# Patient Record
Sex: Male | Born: 1955
Health system: Southern US, Community
[De-identification: ages and names within clinical notes are randomized; demographics above are authoritative.]

## PROBLEM LIST (undated history)

## (undated) ENCOUNTER — Emergency Department (HOSPITAL_COMMUNITY): Admission: EM | Disposition: A | Discharge status: Other Acute Inpatient Hospital | Payer: Self-pay

## (undated) DIAGNOSIS — I272 Pulmonary hypertension, unspecified: Secondary | ICD-10-CM

## (undated) DIAGNOSIS — I10 Essential (primary) hypertension: Secondary | ICD-10-CM

## (undated) DIAGNOSIS — Z8679 Personal history of other diseases of the circulatory system: Secondary | ICD-10-CM

## (undated) DIAGNOSIS — I4891 Unspecified atrial fibrillation: Secondary | ICD-10-CM

## (undated) DIAGNOSIS — Z860100 Personal history of colon polyps, unspecified: Secondary | ICD-10-CM

## (undated) DIAGNOSIS — Z8701 Personal history of pneumonia (recurrent): Secondary | ICD-10-CM

## (undated) DIAGNOSIS — I219 Acute myocardial infarction, unspecified: Secondary | ICD-10-CM

## (undated) DIAGNOSIS — Z87442 Personal history of urinary calculi: Secondary | ICD-10-CM

## (undated) DIAGNOSIS — Z801 Family history of malignant neoplasm of trachea, bronchus and lung: Secondary | ICD-10-CM

## (undated) DIAGNOSIS — E669 Obesity, unspecified: Secondary | ICD-10-CM

## (undated) DIAGNOSIS — J189 Pneumonia, unspecified organism: Secondary | ICD-10-CM

## (undated) DIAGNOSIS — I4811 Longstanding persistent atrial fibrillation: Secondary | ICD-10-CM

## (undated) DIAGNOSIS — I052 Rheumatic mitral stenosis with insufficiency: Secondary | ICD-10-CM

## (undated) DIAGNOSIS — I2510 Atherosclerotic heart disease of native coronary artery without angina pectoris: Secondary | ICD-10-CM

## (undated) DIAGNOSIS — N289 Disorder of kidney and ureter, unspecified: Secondary | ICD-10-CM

## (undated) DIAGNOSIS — I5042 Chronic combined systolic (congestive) and diastolic (congestive) heart failure: Secondary | ICD-10-CM

## (undated) DIAGNOSIS — Z803 Family history of malignant neoplasm of breast: Secondary | ICD-10-CM

## (undated) DIAGNOSIS — E785 Hyperlipidemia, unspecified: Secondary | ICD-10-CM

## (undated) DIAGNOSIS — C801 Malignant (primary) neoplasm, unspecified: Secondary | ICD-10-CM

## (undated) DIAGNOSIS — Z9889 Other specified postprocedural states: Secondary | ICD-10-CM

## (undated) DIAGNOSIS — U071 COVID-19: Secondary | ICD-10-CM

## (undated) DIAGNOSIS — Z972 Presence of dental prosthetic device (complete) (partial): Secondary | ICD-10-CM

## (undated) DIAGNOSIS — J449 Chronic obstructive pulmonary disease, unspecified: Secondary | ICD-10-CM

## (undated) DIAGNOSIS — Z954 Presence of other heart-valve replacement: Secondary | ICD-10-CM

## (undated) HISTORY — DX: Obesity, unspecified: E66.9

## (undated) HISTORY — DX: Family history of malignant neoplasm of trachea, bronchus and lung: Z80.1

## (undated) HISTORY — DX: Chronic obstructive pulmonary disease, unspecified: J44.9

## (undated) HISTORY — DX: Family history of malignant neoplasm of breast: Z80.3

## (undated) HISTORY — PX: APPENDECTOMY: SHX54

## (undated) HISTORY — DX: Personal history of colonic polyps: Z86.010

## (undated) HISTORY — DX: Unspecified atrial fibrillation: I48.91

## (undated) HISTORY — DX: Personal history of colon polyps, unspecified: Z86.0100

## (undated) HISTORY — PX: CORONARY STENT PLACEMENT: SHX1402

---
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
## 2012-06-12 ENCOUNTER — Encounter (HOSPITAL_COMMUNITY): Payer: Self-pay

## 2012-06-12 ENCOUNTER — Inpatient Hospital Stay (HOSPITAL_COMMUNITY)
Admission: EM | Admit: 2012-06-12 | Discharge: 2012-06-18 | DRG: 247 | Disposition: A | Discharge status: Home / Self Care | Payer: MEDICAID | Source: Other Acute Inpatient Hospital | Attending: Cardiology | Admitting: Cardiology

## 2012-06-12 ENCOUNTER — Encounter (HOSPITAL_COMMUNITY)
Admission: EM | Disposition: A | Discharge status: Home / Self Care | Payer: Self-pay | Source: Other Acute Inpatient Hospital | Attending: Cardiology

## 2012-06-12 DIAGNOSIS — Z6841 Body Mass Index (BMI) 40.0 and over, adult: Secondary | ICD-10-CM

## 2012-06-12 DIAGNOSIS — Z87891 Personal history of nicotine dependence: Secondary | ICD-10-CM

## 2012-06-12 DIAGNOSIS — Z955 Presence of coronary angioplasty implant and graft: Secondary | ICD-10-CM

## 2012-06-12 DIAGNOSIS — Z8249 Family history of ischemic heart disease and other diseases of the circulatory system: Secondary | ICD-10-CM

## 2012-06-12 DIAGNOSIS — I10 Essential (primary) hypertension: Secondary | ICD-10-CM | POA: Diagnosis present

## 2012-06-12 DIAGNOSIS — I2119 ST elevation (STEMI) myocardial infarction involving other coronary artery of inferior wall: Secondary | ICD-10-CM

## 2012-06-12 DIAGNOSIS — I4891 Unspecified atrial fibrillation: Secondary | ICD-10-CM | POA: Diagnosis present

## 2012-06-12 DIAGNOSIS — I2510 Atherosclerotic heart disease of native coronary artery without angina pectoris: Secondary | ICD-10-CM | POA: Diagnosis present

## 2012-06-12 DIAGNOSIS — Z79899 Other long term (current) drug therapy: Secondary | ICD-10-CM

## 2012-06-12 DIAGNOSIS — E7800 Pure hypercholesterolemia, unspecified: Secondary | ICD-10-CM | POA: Diagnosis present

## 2012-06-12 HISTORY — DX: Atherosclerotic heart disease of native coronary artery without angina pectoris: I25.10

## 2012-06-12 HISTORY — PX: LEFT HEART CATHETERIZATION WITH CORONARY ANGIOGRAM: SHX5451

## 2012-06-12 HISTORY — DX: Acute myocardial infarction, unspecified: I21.9

## 2012-06-12 LAB — POCT I-STAT, CHEM 8
HCT: 42 % (ref 39.0–52.0)
Hemoglobin: 14.3 g/dL (ref 13.0–17.0)
Potassium: 4.1 mEq/L (ref 3.5–5.1)
Sodium: 140 mEq/L (ref 135–145)
TCO2: 23 mmol/L (ref 0–100)

## 2012-06-12 LAB — DIFFERENTIAL
Basophils Absolute: 0 10*3/uL (ref 0.0–0.1)
Basophils Relative: 0 % (ref 0–1)
Eosinophils Relative: 0 % (ref 0–5)
Lymphocytes Relative: 5 % — ABNORMAL LOW (ref 12–46)
Monocytes Absolute: 0.6 10*3/uL (ref 0.1–1.0)
Monocytes Relative: 4 % (ref 3–12)

## 2012-06-12 LAB — CBC
HCT: 42.6 % (ref 39.0–52.0)
MCHC: 32.9 g/dL (ref 30.0–36.0)
MCV: 85.9 fL (ref 78.0–100.0)
RDW: 14.2 % (ref 11.5–15.5)

## 2012-06-12 LAB — POCT ACTIVATED CLOTTING TIME: Activated Clotting Time: 314 seconds

## 2012-06-12 SURGERY — LEFT HEART CATHETERIZATION WITH CORONARY ANGIOGRAM
Anesthesia: LOCAL

## 2012-06-12 MED ORDER — NITROGLYCERIN IN D5W 200-5 MCG/ML-% IV SOLN
5.0000 ug/min | INTRAVENOUS | Status: DC
  Administered 2012-06-12: 5 ug/min via INTRAVENOUS
  Filled 2012-06-12: qty 250

## 2012-06-12 MED ORDER — AMIODARONE HCL IN DEXTROSE 360-4.14 MG/200ML-% IV SOLN
0.5000 mg/min | INTRAVENOUS | Status: DC
  Administered 2012-06-13 – 2012-06-14 (×4): 0.5 mg/min via INTRAVENOUS
  Filled 2012-06-12 (×8): qty 200

## 2012-06-12 MED ORDER — TICAGRELOR 90 MG PO TABS
90.0000 mg | ORAL_TABLET | Freq: Two times a day (BID) | ORAL | Status: DC
  Administered 2012-06-12 – 2012-06-18 (×11): 90 mg via ORAL
  Filled 2012-06-12 (×14): qty 1

## 2012-06-12 MED ORDER — ASPIRIN 81 MG PO CHEW: 81.0000 mg | CHEWABLE_TABLET | Freq: Every day | ORAL | Status: DC

## 2012-06-12 MED ORDER — PANTOPRAZOLE SODIUM 40 MG PO TBEC: 40.0000 mg | DELAYED_RELEASE_TABLET | Freq: Every day | ORAL | Status: DC

## 2012-06-12 MED ORDER — ACETAMINOPHEN 325 MG PO TABS
650.0000 mg | ORAL_TABLET | ORAL | Status: DC | PRN
  Administered 2012-06-12 – 2012-06-16 (×4): 650 mg via ORAL
  Filled 2012-06-12 (×3): qty 2

## 2012-06-12 MED ORDER — BIVALIRUDIN 250 MG IV SOLR
1.7500 mg/kg/h | INTRAVENOUS | Status: DC
  Filled 2012-06-12: qty 250

## 2012-06-12 MED ORDER — ASPIRIN 81 MG PO CHEW: 324.0000 mg | CHEWABLE_TABLET | ORAL | Status: DC

## 2012-06-12 MED ORDER — ASPIRIN EC 81 MG PO TBEC
81.0000 mg | DELAYED_RELEASE_TABLET | Freq: Every day | ORAL | Status: DC
  Administered 2012-06-13 – 2012-06-18 (×5): 81 mg via ORAL
  Filled 2012-06-12 (×7): qty 1

## 2012-06-12 MED ORDER — AMIODARONE HCL IN DEXTROSE 360-4.14 MG/200ML-% IV SOLN
60.0000 mg/h | INTRAVENOUS | Status: DC
  Filled 2012-06-12 (×4): qty 200

## 2012-06-12 MED ORDER — SODIUM CHLORIDE 0.9 % IV SOLN
INTRAVENOUS | Status: DC
  Administered 2012-06-12 – 2012-06-13 (×3): 1000 mL via INTRAVENOUS

## 2012-06-12 MED ORDER — AMIODARONE HCL IN DEXTROSE 360-4.14 MG/200ML-% IV SOLN
1.0000 mg/min | INTRAVENOUS | Status: AC
  Administered 2012-06-12 (×2): 1 mg/min via INTRAVENOUS
  Filled 2012-06-12: qty 200

## 2012-06-12 MED ORDER — ATORVASTATIN CALCIUM 80 MG PO TABS
80.0000 mg | ORAL_TABLET | Freq: Every day | ORAL | Status: DC
  Administered 2012-06-12 – 2012-06-17 (×6): 80 mg via ORAL
  Filled 2012-06-12 (×8): qty 1

## 2012-06-12 MED ORDER — AMIODARONE IV BOLUS ONLY 150 MG/100ML
150.0000 mg | Freq: Once | INTRAVENOUS | Status: AC
  Administered 2012-06-12: 150 mg via INTRAVENOUS

## 2012-06-12 MED ORDER — ABCIXIMAB 2 MG/ML IV SOLN
0.2500 mg/kg | Freq: Once | INTRAVENOUS | Status: DC
  Filled 2012-06-12 (×2): qty 20

## 2012-06-12 MED ORDER — ACETAMINOPHEN 325 MG PO TABS
650.0000 mg | ORAL_TABLET | ORAL | Status: DC | PRN
  Filled 2012-06-12: qty 2

## 2012-06-12 MED ORDER — SODIUM CHLORIDE 0.9 % IV SOLN
10.0000 ug/min | INTRAVENOUS | Status: DC
  Filled 2012-06-12: qty 3.6

## 2012-06-12 MED ORDER — ONDANSETRON HCL 4 MG/2ML IJ SOLN
4.0000 mg | Freq: Four times a day (QID) | INTRAMUSCULAR | Status: DC | PRN
  Administered 2012-06-12 – 2012-06-17 (×4): 4 mg via INTRAVENOUS
  Filled 2012-06-12 (×4): qty 2

## 2012-06-12 MED ORDER — METOPROLOL TARTRATE 12.5 MG HALF TABLET
12.5000 mg | ORAL_TABLET | Freq: Two times a day (BID) | ORAL | Status: DC
  Administered 2012-06-13 – 2012-06-18 (×8): 12.5 mg via ORAL
  Filled 2012-06-12 (×14): qty 1

## 2012-06-12 MED ORDER — ATROPINE SULFATE 1 MG/ML IJ SOLN
INTRAMUSCULAR | Status: AC
  Filled 2012-06-12: qty 1

## 2012-06-12 MED ORDER — ASPIRIN 300 MG RE SUPP: 300.0000 mg | RECTAL | Status: DC

## 2012-06-12 MED ORDER — NITROGLYCERIN 0.4 MG SL SUBL: 0.4000 mg | SUBLINGUAL_TABLET | SUBLINGUAL | Status: DC | PRN

## 2012-06-12 MED ORDER — ONDANSETRON HCL 4 MG/2ML IJ SOLN: 4.0000 mg | Freq: Four times a day (QID) | INTRAMUSCULAR | Status: DC | PRN

## 2012-06-12 MED ORDER — SODIUM CHLORIDE 0.9 % IV SOLN
1.7500 mg/kg/h | INTRAVENOUS | Status: DC
  Filled 2012-06-12: qty 250

## 2012-06-12 NOTE — Progress Notes (Signed)
Chaplain Note:  Chaplain responded to Code STEMI page.  Pt was in cath lab being treated by [facility] Cath Lab physician and staff.  Chaplain provided spiritual support for staff as they treated pt.  No one from pt's family was present at this time.  Referring case to on-call chaplain for follow up.  06/12/12 1700  Clinical Encounter Type  Visited With Patient;Health care provider  Visit Type Spiritual support  Referral From Other (Comment) (Code STEMI page)  Consult/Referral To Chaplain  Spiritual Encounters  Spiritual Needs Emotional  Stress Factors  Patient Stress Factors Health changes;Major life changes  Family Stress Factors Not reviewed (No family present)   Verdie Shire, chaplain resident [phone]

## 2012-06-12 NOTE — H&P (Signed)
Jeffrey Larson is an 56 y.o. male.   Chief Complaint: Chest pain HPI: Patient is 56 year old male with no significant past medical history except for tobacco abuse and strong family history of coronary artery disease morbid obesity came to [facility] by EMS complaining of retrosternal chest pain described as pressure associated with nausea and mild shortness of breath EKG done on the field showed normal sinus rhythm with ST elevation in leads 23 aVF and is focal changes in leads 1 and aVL suggestive of acute inferior wall injury patient states chest pain started approximately 45 minutes prior to calling EMS. Patient denies such episodes of chest pain in the past denies any palpitation lightheadedness or syncope denies PND orthopnea leg swelling. Code STEMI was called and patient was directly brought to the Cath Lab.  No past medical history on file.  No past surgical history on file.  No family history on file. Social History:  does not have a smoking history on file. He does not have any smokeless tobacco history on file. His alcohol and drug histories not on file.  Allergies: Allergies not on file  No prescriptions prior to admission    No results found for this or any previous visit (from the past 48 hour(s)). No results found.  Review of Systems  Constitutional: Negative for fever and chills.  Eyes: Negative for blurred vision.  Respiratory: Positive for shortness of breath. Negative for cough, hemoptysis and sputum production.   Cardiovascular: Positive for chest pain. Negative for palpitations, orthopnea, claudication and leg swelling.  Gastrointestinal: Negative for heartburn, nausea and vomiting.  Neurological: Negative for dizziness and headaches.    Weight 147 kg (324 lb 1.2 oz). Physical Exam  Constitutional: He is oriented to person, place, and time. He appears well-developed and well-nourished.  Eyes: Conjunctivae are normal. Pupils are equal, round, and reactive  to light. Left eye exhibits no discharge.  Neck: Normal range of motion. Neck supple. No JVD present. No tracheal deviation present. No thyromegaly present.  Cardiovascular:       Regularly irregular S1 and S2 normal there was soft systolic murmur and S4 gallop  Respiratory: Breath sounds normal. No respiratory distress. He has no wheezes. He has no rales.  GI: Soft. Bowel sounds are normal. He exhibits distension. There is no tenderness. There is no rebound.  Musculoskeletal: He exhibits no tenderness.  Neurological: He is alert and oriented to person, place, and time.     Assessment/Plan Acute infant wall myocardial infarction Morbid obesity History of tobacco abuse Positive family history of coronary artery disease Plan Discussed briefly with patient regarding emergency left cath possible PTCA stenting its risk and benefits and consents for PCI  [facility] N 06/12/2012, 6:24 PM

## 2012-06-12 NOTE — CV Procedure (Signed)
Cardiac cath/PTCA stenting report dictated on 718 13 dictation number is [id number]

## 2012-06-12 NOTE — Progress Notes (Signed)
06/12/2012 2050  Rt femoral sheath removed. Pressure held times 25 minutes. Slight bruising noted around sheath site prior to removal. No hematoma. Pressure dressing place and Pt instructed to S/S to call for. Dr. Sharyn Lull here and looked at site. BP low 90's. Still in At. Fib.  Amiodarone IV 150 mg bolus given and NS 250 ml bolus given. No c/p. Alvester Eads, Linnell Fulling

## 2012-06-13 LAB — BASIC METABOLIC PANEL
BUN: 14 mg/dL (ref 6–23)
CO2: 22 mEq/L (ref 19–32)
Calcium: 8.6 mg/dL (ref 8.4–10.5)
GFR calc non Af Amer: >90 mL/min (ref >90)
Glucose, Bld: 149 mg/dL — ABNORMAL HIGH (ref 70–99)

## 2012-06-13 LAB — CARDIAC PANEL(CRET KIN+CKTOT+MB+TROPI)
CK, MB: 127.7 ng/mL (ref 0.3–4.0)
Relative Index: 10 — ABNORMAL HIGH (ref 0.0–2.5)
Total CK: 1283 U/L — ABNORMAL HIGH (ref 7–232)
Total CK: 1228 U/L — ABNORMAL HIGH (ref 7–232)

## 2012-06-13 LAB — CBC
HCT: 41.9 % (ref 39.0–52.0)
Hemoglobin: 13.5 g/dL (ref 13.0–17.0)
MCH: 27.8 pg (ref 26.0–34.0)
MCHC: 32.2 g/dL (ref 30.0–36.0)
MCV: 86.2 fL (ref 78.0–100.0)
RBC: 4.86 MIL/uL (ref 4.22–5.81)

## 2012-06-13 LAB — HEPARIN LEVEL (UNFRACTIONATED): Heparin Unfractionated: 0.18 IU/mL — ABNORMAL LOW (ref 0.30–0.70)

## 2012-06-13 MED ORDER — SODIUM CHLORIDE 0.9 % IV SOLN
INTRAVENOUS | Status: DC
  Administered 2012-06-13: 250 mL via INTRAVENOUS
  Administered 2012-06-16: 06:00:00 via INTRAVENOUS

## 2012-06-13 MED ORDER — AMIODARONE IV BOLUS ONLY 150 MG/100ML
150.0000 mg | Freq: Once | INTRAVENOUS | Status: AC
  Administered 2012-06-13: 150 mg via INTRAVENOUS

## 2012-06-13 MED ORDER — PANTOPRAZOLE SODIUM 40 MG PO TBEC
40.0000 mg | DELAYED_RELEASE_TABLET | Freq: Every day | ORAL | Status: DC
  Administered 2012-06-13 – 2012-06-18 (×5): 40 mg via ORAL
  Filled 2012-06-13 (×5): qty 1

## 2012-06-13 MED ORDER — HEPARIN (PORCINE) IN NACL 100-0.45 UNIT/ML-% IJ SOLN
2400.0000 [IU]/h | INTRAMUSCULAR | Status: DC
  Administered 2012-06-14: 1900 [IU]/h via INTRAVENOUS
  Administered 2012-06-15: 2000 [IU]/h via INTRAVENOUS
  Administered 2012-06-15: 1900 [IU]/h via INTRAVENOUS
  Administered 2012-06-16 (×2): 2400 [IU]/h via INTRAVENOUS
  Filled 2012-06-13 (×11): qty 250

## 2012-06-13 MED ORDER — HEPARIN (PORCINE) IN NACL 100-0.45 UNIT/ML-% IJ SOLN
1600.0000 [IU]/h | INTRAMUSCULAR | Status: DC
  Administered 2012-06-13: 1600 [IU]/h via INTRAVENOUS
  Filled 2012-06-13 (×2): qty 250

## 2012-06-13 MED FILL — Nitroglycerin IV Soln 200 MCG/ML in D5W: INTRAVENOUS | Qty: 1 | Status: AC

## 2012-06-13 MED FILL — Lidocaine HCl Local Preservative Free (PF) Inj 1%: INTRAMUSCULAR | Qty: 30 | Status: AC

## 2012-06-13 MED FILL — Metoprolol Tartrate IV Soln 5 MG/5ML: INTRAVENOUS | Qty: 5 | Status: AC

## 2012-06-13 MED FILL — Dopamine in Dextrose 5% Inj 3.2 MG/ML: INTRAVENOUS | Qty: 250 | Status: AC

## 2012-06-13 MED FILL — Atropine Sulfate Inj 0.1 MG/ML: INTRAMUSCULAR | Qty: 10 | Status: AC

## 2012-06-13 MED FILL — Bivalirudin Trifluoroacetate For IV Soln 250 MG (Base Equiv): INTRAVENOUS | Qty: 250 | Status: AC

## 2012-06-13 MED FILL — Midazolam HCl Inj 2 MG/2ML (Base Equivalent): INTRAMUSCULAR | Qty: 2 | Status: AC

## 2012-06-13 MED FILL — Ticagrelor Tab 90 MG: ORAL | Qty: 2 | Status: AC

## 2012-06-13 MED FILL — Fentanyl Citrate Inj 0.05 MG/ML: INTRAMUSCULAR | Qty: 2 | Status: AC

## 2012-06-13 MED FILL — Amiodarone HCl Inj 150 MG/3ML (50 MG/ML): INTRAVENOUS | Qty: 3 | Status: AC

## 2012-06-13 MED FILL — Heparin Sodium (Porcine) 2 Unit/ML in Sodium Chloride 0.9%: INTRAMUSCULAR | Qty: 2000 | Status: AC

## 2012-06-13 MED FILL — Dextrose Inj 5%: INTRAVENOUS | Qty: 50 | Status: AC

## 2012-06-13 NOTE — Progress Notes (Signed)
Subjective:  Patient denies any chest pain or shortness of breath feels much better. Patient remains in atrial fibrillation with moderate ventral response.  Objective:  Vital Signs in the last 24 hours: Temp:  [97.6 F (36.4 C)-98.7 F (37.1 C)] 97.6 F (36.4 C) (07/19 0400) Pulse Rate:  [45-104] 99  (07/19 0600) Resp:  [11-19] 15  (07/19 0600) BP: (80-123)/(45-78) 123/64 mmHg (07/19 0600) SpO2:  [94 %-99 %] 95 % (07/19 0600) Arterial Line BP: (108-114)/(67-74) 114/74 mmHg (07/18 2115) FiO2 (%):  [28 %] 28 % (07/18 2138) Weight:  [141.1 kg (311 lb 1.1 oz)-147 kg (324 lb 1.2 oz)] 141.1 kg (311 lb 1.1 oz) (07/18 2000)  Intake/Output from previous day: 07/18 0701 - 07/19 0700 In: 3224.9 [P.O.:220; I.V.:2804.9; IV Piggyback:200] Out: 1000 [Urine:1000] Intake/Output from this shift: Total I/O In: 3.8 [I.V.:3.8] Out: -   Physical Exam: Neck: no adenopathy, no carotid bruit, no JVD and supple, symmetrical, trachea midline Lungs: clear to auscultation bilaterally Heart: irregularly irregular rhythm, S1, S2 normal and No S3 gallop Abdomen: soft, non-tender; bowel sounds normal; no masses,  no organomegaly Extremities: extremities normal, atraumatic, no cyanosis or edema and Right groin stable no evidence of hematoma  Lab Results:  Basename 06/13/12 0400 06/12/12 2155  WBC 13.8* 15.4*  HGB 13.5 14.0  PLT 248 252    Basename 06/13/12 0400 06/12/12 1741  NA 138 140  K 4.6 4.1  CL 104 104  CO2 22 --  GLUCOSE 149* 115*  BUN 14 15  CREATININE 0.77 0.90    Basename 06/13/12 0400 06/12/12 2144  TROPONINI >20.00* >20.00*   Hepatic Function Panel No results found for this basename: PROT,ALBUMIN,AST,ALT,ALKPHOS,BILITOT,BILIDIR,IBILI in the last 72 hours No results found for this basename: CHOL in the last 72 hours No results found for this basename: PROTIME in the last 72 hours  Imaging: Imaging results have been reviewed and No results found.  Cardiac  Studies:  Assessment/Plan:  Acute inferior wall myocardial infarction status post aspiration thrombectomy/intracoronary ReoPro infusion/PTCA stenting to 100% occluded RCA with excellent results Multivessel coronary artery disease New-onset A. fib Morbid obesity History of tobacco abuse Positive family history of coronary artery disease Plan Continue present management Amiodarone 150 mg bolus times one dose May need to restart heparin if remains in atrial fibrillation  DC nitroglycerin Check labs in a.m.  LOS: 1 day    Jeffrey Larson N 06/13/2012, 7:14 AM

## 2012-06-13 NOTE — Progress Notes (Signed)
CC: Acute inferior wall MI s/p post aspiration thrombectomy/intracoronary.on 07/18  Anticoag: STEMI; new onset afib.  S/p PTCA stenting on 07/18. Goal heparin 0.3-0.7. No bolus per MD. Dosing weight 108.2 kg  Plan: 1) Start heparin at 1600 units/hr. No bolus. 2) Check a 6 hr heparin level. 3) Daily heparin level and CBC.

## 2012-06-13 NOTE — Cardiovascular Report (Signed)
NAMESharen Counter NO.:  [id number]  MEDICAL RECORD NO.:  [id number]  LOCATION:  [facility]                         FACILITY:  [facility]  PHYSICIAN:  Deara Bober N. Sharyn Lull, M.D. DATE OF BIRTH:  March 06, 1956  DATE OF PROCEDURE:  06/12/2012 DATE OF DISCHARGE:  06/12/2012                           CARDIAC CATHETERIZATION   PROCEDURE: 1. Left cardiac cath with selective left and right coronary     angiography, LV graphy via right groin using Judkins technique. 2. Aspiration thrombectomy using Xpress-Way 6-French thrombectomy     catheter. 3. Intracoronary infusion of ReoPro using ClearWay catheter. 4. Successful percutaneous transluminal coronary angioplasty to     proximal RCA using 3.0 x 15 mm long Emerge balloon and then 3.5 x     15 mm long trek balloon. 5. Successful deployment of 4.0 x 23 mm long Xience Xpedition drug-     eluting stent in proximal RCA. 6. Successful postdilatation of this stent using 4.0 x 15 mm long [place]     trek balloon.  INDICATION FOR THE PROCEDURE:  Mr. Amalia Hailey is a 56 year old man with no significant past medical history except for tobacco abuse, morbid obesity, and strong family history of coronary artery disease.  He came to [facility] by [facility] EMS.  The patient complains retrosternal chest pain described as pressure, associated with nausea and mild shortness of breath.  EKG done on the field showed normal sinus rhythm with ST elevation in II, III, aVF and reciprocal changes in leads 1 and aVL suggestive of acute inferior wall injury.  The patient states chest pain started approximately 45 minutes prior to calling EMS.  The patient denies any such episodes of chest pain in the past.  Denies any palpitation, lightheadedness, or syncope.  Denies PND, orthopnea, leg swelling.  Code STEMI was called, and the patient was directly brought to the cath lab.  Discussed briefly with the patient regarding emergency left cath, possible  PTCA stenting, its risks and benefits, i.e., death, MI, stroke, need for emergency CABG, local vascular complications, etc., and consented for PCI.  PROCEDURE:  After obtaining the informed consent, the patient was placed on fluoroscopy table.  Right groin was prepped and draped in usual fashion.  1% Xylocaine was used for local anesthesia in the right groin. With the help of thin-walled needle, 6-French arterial sheath was placed.  The sheath was aspirated and flushed.  A 6-French left Judkins catheter was advanced over the wire under fluoroscopic guidance up to the ascending aorta.  Wire was pulled out.  The catheter was aspirated and connected to the Manifold.  Catheter was further advanced and engaged into left coronary ostium.  Multiple views of the left system were taken.  Catheter was disengaged and was pulled out over the wire and was replaced with 6-French right guiding catheter, which was advanced over the wire under fluoroscopic guidance up to the ascending aorta.  Wire was pulled out.  The catheter was aspirated and connected to the Manifold.  Catheter was further advanced and engaged into the right coronary ostium.  A single view of right coronary artery was obtained.  This catheter was pulled out over the wire at the  end of the procedure and was replaced with 6-French pigtail catheter, which was advanced over the wire under fluoroscopic guidance up to the ascending aorta.  Wire was pulled out.  The catheter was aspirated and connected to the Manifold.  Catheter was further advanced across the aortic valve into the LV.  LV pressures were recorded.  LV graphy was done in 30-degree RAO position.  Post angiographic pressures were recorded from LV and then pullback pressures were recorded from the aorta.  There was no gradient across the aortic valve.  Pigtail catheter was pulled out over the wire.  Sheaths were aspirated and flushed.  FINDINGS:  LV showed left inferior  wall hypokinesia, EF of 45% to 50%. Left main was patent.  LAD has 70% to 80% proximal bifurcation stenosis. Diagonal 1 was small, diagonal 2 was small.  Ramus was small, which was patent.  Left circumflex had 80% to 85% proximal stenosis.  OM-1 was moderate size, which was patent.  OM-2 was small, which was patent.  RCA was 100% occluded beyond the ostium with TIMI 0 flow.  INTERVENTIONAL PROCEDURE:  Aspiration thrombectomy was done initially using Xpress-Way catheter, 2 passes were done.  Angiogram showed large thrombus burden with diminished inflow and then ClearWay RX 1.5 x 20 mm long catheter was used for intracoronary ReoPro bolus infusion with improvement in his flow to distal RCA.  PTCA to proximal RCA was done using 3.0 x 15 mm long Emerge balloon and then 3.5 x 15 mm long trek balloon.  Multiple inflations were done.  Attempted to deploy Xience Xpedition 4.0 x 23 mm long drug-eluting stent in proximal RCA without success and then buddy wire Mailman wire was used and then 4.0 x 23 mm long Xience Xpedition stent was deployed in proximal RCA without difficulty.  This stent was post dilated using 4.0 x 15 mm long [place] trek balloon going up to 15-20 atmospheric pressure. Lesion dilated from 100% to less than 10% with excellent TIMI grade 3 distal flow without evidence of dissection or distal embolization.  The patient had episode of bradycardia requiring 1 mg of atropine and atrial fibrillation during the procedure requiring 2 total boluses 150 mg of IV amiodarone during the procedure.  The patient tolerated the procedure well.  There were no complications.  The patient was transferred to recovery room in stable condition.     Eduardo Osier. Sharyn Lull, M.D.     MNH/MEDQ  D:  06/12/2012  T:  06/13/2012  Job:  [id number]

## 2012-06-13 NOTE — Progress Notes (Signed)
1430-1500 Education began with pt and family. Very attentive with good questions. Did not walk with pt as he is still nauseated.Jeffrey Cedar DunlapR

## 2012-06-13 NOTE — Progress Notes (Signed)
ANTICOAGULATION CONSULT NOTE - Follow Up Consult  Pharmacy Consult for Heparin Indication: STEMI/ AFib  No Known Allergies  Patient Measurements: Height: 5\' 11"  (180.3 cm) Weight: 311 lb 1.1 oz (141.1 kg) IBW/kg (Calculated) : 75.3  Heparin Dosing Weight: 108 kg  Vital Signs: Temp: 97.8 F (36.6 C) (07/19 1600) Temp src: Oral (07/19 1600) BP: 112/73 mmHg (07/19 1600) Pulse Rate: 53  (07/19 1600)  Labs:  Basename 06/13/12 1621 06/13/12 1002 06/13/12 0400 06/12/12 2155 06/12/12 2144 06/12/12 2000 06/12/12 1741  HGB -- -- 13.5 14.0 -- -- --  HCT -- -- 41.9 42.6 -- -- 42.0  PLT -- -- 248 252 -- 240 --  APTT -- -- -- -- -- -- --  LABPROT -- -- -- -- -- -- --  INR -- -- -- -- -- -- --  HEPARINUNFRC 0.18* -- -- -- -- -- --  CREATININE -- -- 0.77 -- -- -- 0.90  CKTOTAL -- 1228* 1283* -- 1425* -- --  CKMB -- 119.3* 127.7* -- 132.3* -- --  TROPONINI -- >20.00* >20.00* -- >20.00* -- --    Estimated Creatinine Clearance: 149.9 ml/min (by C-G formula based on Cr of 0.77).   Medications:  Scheduled:    . amiodarone  150 mg Intravenous Once  . amiodarone  150 mg Intravenous Once  . aspirin EC  81 mg Oral Daily  . atorvastatin  80 mg Oral q1800  . metoprolol tartrate  12.5 mg Oral BID  . pantoprazole  40 mg Oral Daily  . Ticagrelor  90 mg Oral BID  . DISCONTD: abciximab  0.25 mg/kg Intravenous Once  . DISCONTD: aspirin  324 mg Oral NOW  . DISCONTD: aspirin  81 mg Oral Daily  . DISCONTD: aspirin  300 mg Rectal NOW  . DISCONTD: pantoprazole  40 mg Oral Q0600    Assessment: 43 YOM admitted with STEMI and Afib and s/p PTCA stenting on 7/18 started on heparin. The 6 hour heparin level is sub-therapeutic at 0.18 on 1600 units/hr. CBC was wnl this am. RN states the first IV may infiltrated around 2pm. Drip was off for about 5 minutes around this time. No further issues noted. No bleeding noted.   Goal of Therapy:  Heparin level 0.3-0.7 units/ml Monitor platelets by  anticoagulation protocol: Yes   Plan:  1. Increase heparin to 1900 units/hr (62ml/hr).  2. Recheck in 6 hours.   Link Snuffer, PharmD, BCPS Clinical Pharmacist [phone] 06/13/2012,4:54 PM

## 2012-06-13 NOTE — Progress Notes (Signed)
Utilization Review Completed.Jeffrey Larson T7/19/2013   

## 2012-06-13 NOTE — Care Management Note (Addendum)
    Page 1 of 1   06/17/2012     11:56:31 AM   CARE MANAGEMENT NOTE 06/17/2012  Patient:  Jeffrey Larson, Jeffrey Larson   Account Number:  [id number]  Date Initiated:  06/13/2012  Documentation initiated by:  Alvira Philips Assessment:   56 yr-old male adm with dx of STEMI and AFlutter; lives with spouse, independent PTA.     Action/Plan:   Anticipated DC Date:     Anticipated DC Plan:    In-house referral  Financial Counselor      DC Planning Services  CM consult  Medication Assistance      Choice offered to / List presented to:             Status of service:  Completed, signed off Medicare Important Message given?   (If response is "NO", the following Medicare IM given date fields will be blank) Date Medicare IM given:   Date Additional Medicare IM given:    Discharge Disposition:  HOME/SELF CARE  Per UR Regulation:  Reviewed for med. necessity/level of care/duration of stay  If discussed at Long Length of Stay Meetings, dates discussed:    Comments:  06-17-12 [address] Tomi Bamberger, RN,BSN [phone] CM spoke to wife about brilinta medicaitons and may have issue with cost. CM placed [place] and [place] form on chart- MD please fill out. Pt will need to be on all generics if possible at d/c. Medication is available at [facility]. No further needs from CM at this time.   06/13/12 1400 Henrietta Mayo RN MSN CCM Pt has no insurance, is self-employed.  Provided card for free 30-day supply of Brilinta.  Per his pharmacy, [facility] in [place], 30-day supply of Brilinta will cost $270.29 and he has a savings card to get $75 discount.  Discussed with pt and his wife that their cost will be $195.29 - they state they will be able to afford that price.  Pt is concerned about other medications he may be discharged on - CM will continue to follow.

## 2012-06-14 LAB — CBC
Hemoglobin: 13.8 g/dL (ref 13.0–17.0)
Platelets: 232 10*3/uL (ref 150–400)
RBC: 4.91 MIL/uL (ref 4.22–5.81)
WBC: 11.4 10*3/uL — ABNORMAL HIGH (ref 4.0–10.5)

## 2012-06-14 LAB — BASIC METABOLIC PANEL
CO2: 28 mEq/L (ref 19–32)
Calcium: 8.6 mg/dL (ref 8.4–10.5)
GFR calc non Af Amer: >90 mL/min (ref >90)
Potassium: 4 mEq/L (ref 3.5–5.1)
Sodium: 140 mEq/L (ref 135–145)

## 2012-06-14 LAB — CARDIAC PANEL(CRET KIN+CKTOT+MB+TROPI)
Relative Index: 6.5 — ABNORMAL HIGH (ref 0.0–2.5)
Total CK: 584 U/L — ABNORMAL HIGH (ref 7–232)
Troponin I: 12.64 ng/mL (ref <0.30)

## 2012-06-14 LAB — HEPARIN LEVEL (UNFRACTIONATED): Heparin Unfractionated: 0.47 IU/mL (ref 0.30–0.70)

## 2012-06-14 MED ORDER — AMIODARONE HCL 200 MG PO TABS
400.0000 mg | ORAL_TABLET | Freq: Two times a day (BID) | ORAL | Status: DC
  Administered 2012-06-14 – 2012-06-18 (×8): 400 mg via ORAL
  Filled 2012-06-14 (×11): qty 2

## 2012-06-14 MED ORDER — AMIODARONE IV BOLUS ONLY 150 MG/100ML
150.0000 mg | Freq: Once | INTRAVENOUS | Status: AC
  Administered 2012-06-14: 150 mg via INTRAVENOUS

## 2012-06-14 MED ORDER — RAMIPRIL 2.5 MG PO CAPS
2.5000 mg | ORAL_CAPSULE | Freq: Every day | ORAL | Status: DC
  Administered 2012-06-14 – 2012-06-18 (×4): 2.5 mg via ORAL
  Filled 2012-06-14 (×6): qty 1

## 2012-06-14 NOTE — Progress Notes (Signed)
Subjective:  Patient denies any chest pain or shortness of breath remains in A. fib with controlled ventricular response on IV amiodarone and heparin. Discussed with patient regarding atrial fibrillation and possible long-term anticoagulation if remains in A. fib and also regarding other significant lesions in LAD and left circumflex which will need the PCI. If remains in A. fib then probably will need PTCA stenting during this admission while on heparin.  Objective:  Vital Signs in the last 24 hours: Temp:  [97.6 F (36.4 C)-98.9 F (37.2 C)] 98.8 F (37.1 C) (07/20 0700) Pulse Rate:  [53-101] 87  (07/20 0000) Resp:  [13-20] 17  (07/20 0500) BP: (96-121)/(50-82) 105/57 mmHg (07/20 0500) SpO2:  [96 %-98 %] 97 % (07/20 0500)  Intake/Output from previous day: 07/19 0701 - 07/20 0700 In: 972.5 [I.V.:966.5; IV Piggyback:6] Out: 1125 [Urine:1125] Intake/Output from this shift:    Physical Exam: Neck: no adenopathy, no carotid bruit, no JVD and supple, symmetrical, trachea midline Lungs: clear to auscultation bilaterally Heart: irregularly irregular rhythm, S1, S2 normal and Soft systolic murmur noted Abdomen: soft, non-tender; bowel sounds normal; no masses,  no organomegaly Extremities: extremities normal, atraumatic, no cyanosis or edema  Lab Results:  Basename 06/14/12 0530 06/13/12 0400  WBC 11.4* 13.8*  HGB 13.8 13.5  PLT 232 248    Basename 06/14/12 0530 06/13/12 0400  NA 140 138  K 4.0 4.6  CL 103 104  CO2 28 22  GLUCOSE 113* 149*  BUN 13 14  CREATININE 0.95 0.77    Basename 06/14/12 0530 06/13/12 1002  TROPONINI 12.64* >20.00*   Hepatic Function Panel No results found for this basename: PROT,ALBUMIN,AST,ALT,ALKPHOS,BILITOT,BILIDIR,IBILI in the last 72 hours No results found for this basename: CHOL in the last 72 hours No results found for this basename: PROTIME in the last 72 hours  Imaging: Imaging results have been reviewed and No results found.  Cardiac  Studies:  Assessment/Plan:  Acute inferior wall myocardial infarction status post aspiration thrombectomy/intracoronary ReoPro infusion/PTCA stenting to 100% occluded RCA with excellent results  Multivessel coronary artery disease  New-onset A. fib  Morbid obesity  History of tobacco abuse  Positive family history of coronary artery disease  Plan  Continue present management  Amiodarone 150 mg bolus times one dose Add low-dose ACE inhibitors Check labs in a.m.  LOS: 2 days    Tanecia Mccay N 06/14/2012, 10:06 AM

## 2012-06-14 NOTE — Progress Notes (Signed)
ANTICOAGULATION CONSULT NOTE - Follow Up Consult  Pharmacy Consult for heparin Indication: atrial fibrillation and STEMI  Labs:  Basename 06/13/12 2317 06/13/12 1621 06/13/12 1002 06/13/12 0400 06/12/12 2155 06/12/12 2144 06/12/12 2000 06/12/12 1741  HGB -- -- -- 13.5 14.0 -- -- --  HCT -- -- -- 41.9 42.6 -- -- 42.0  PLT -- -- -- 248 252 -- 240 --  APTT -- -- -- -- -- -- -- --  LABPROT -- -- -- -- -- -- -- --  INR -- -- -- -- -- -- -- --  HEPARINUNFRC 0.33 0.18* -- -- -- -- -- --  CREATININE -- -- -- 0.77 -- -- -- 0.90  CKTOTAL -- -- 1228* 1283* -- 1425* -- --  CKMB -- -- 119.3* 127.7* -- 132.3* -- --  TROPONINI -- -- >20.00* >20.00* -- >20.00* -- --    Assessment/Plan:  56yo male now therapeutic on heparin after rate increase.  Will continue gtt at current rate and confirm stable with am labs.  Colleen Can PharmD BCPS 06/14/2012,12:15 AM

## 2012-06-14 NOTE — Progress Notes (Signed)
CC: Acute inferior wall MI s/p post aspiration thrombectomy/intracoronary.on 07/18  Anticoag: STEMI; new onset afib. S/p PTCA stenting on 07/18. Goal heparin 0.3-0.7. No bolus per MD. Dosing weight 108.2 kg. Heparin level this am was 0.47; troponin 12.64; H/H 13.8/42.9; Plt 232 (stable)  Plan: 1) Cont heparin at 1900 units/hr.  2) Daily heparin level and CBC.

## 2012-06-14 NOTE — Progress Notes (Signed)
CARDIAC REHAB PHASE I   PRE:  Rate/Rhythm: 99 a fib  BP:  Supine:   Sitting: 99/57  Standing:    SaO2: 98 % ra  MODE:  Ambulation: 350 ft   POST:  Rate/Rhythem: 92 afib  BP:  Supine:   Sitting: 102/61  Standing:    SaO2: 98  10:20 am to 11:00 am  Patient ambulated with assist x 1, gait steady, pace good.  Denies any chest pain, still has some slight nausea, no change in that status with walking.   Returned to room to chair, remains in atrial fibrillation.  Daughter visiting.     Jackey Loge

## 2012-06-15 LAB — BASIC METABOLIC PANEL
BUN: 14 mg/dL (ref 6–23)
CO2: 26 mEq/L (ref 19–32)
Chloride: 103 mEq/L (ref 96–112)
Creatinine, Ser: 0.96 mg/dL (ref 0.50–1.35)

## 2012-06-15 LAB — CBC WITH DIFFERENTIAL/PLATELET
HCT: 40.3 % (ref 39.0–52.0)
Hemoglobin: 13 g/dL (ref 13.0–17.0)
Lymphocytes Relative: 16 % (ref 12–46)
Monocytes Absolute: 0.9 10*3/uL (ref 0.1–1.0)
Monocytes Relative: 8 % (ref 3–12)
Neutro Abs: 8.3 10*3/uL — ABNORMAL HIGH (ref 1.7–7.7)
WBC: 11 10*3/uL — ABNORMAL HIGH (ref 4.0–10.5)

## 2012-06-15 LAB — MAGNESIUM: Magnesium: 1.9 mg/dL (ref 1.5–2.5)

## 2012-06-15 LAB — HEPARIN LEVEL (UNFRACTIONATED)
Heparin Unfractionated: 0.72 IU/mL — ABNORMAL HIGH (ref 0.30–0.70)
Heparin Unfractionated: 0.21 IU/mL — ABNORMAL LOW (ref 0.30–0.70)

## 2012-06-15 LAB — CARDIAC PANEL(CRET KIN+CKTOT+MB+TROPI): Relative Index: 3.8 — ABNORMAL HIGH (ref 0.0–2.5)

## 2012-06-15 NOTE — Progress Notes (Signed)
CC: Acute inferior wall MI s/p post aspiration thrombectomy/intracoronary.on 07/18  Anticoag: STEMI; new onset afib. S/p PTCA stenting on 07/18. Goal heparin 0.3-0.7. No bolus per MD. Dosing weight 108.2 kg. Heparin level this am was 0.72; troponin 9.58; H/H 13/40.3/ Plt 214  Plan: 1) Reduce heparin at 1800 units/hr.  2) Check a 6hr heparin level 3) Daily heparin level and CBC.

## 2012-06-15 NOTE — Progress Notes (Signed)
Subjective:  Patient denies any chest pain or shortness of breath. Remains in A. fib with controlled ventricular response. Beta blocker dose was held early this morning due to low blood pressure Objective:  Vital Signs in the last 24 hours: Temp:  [97.4 F (36.3 C)-98.9 F (37.2 C)] 97.9 F (36.6 C) (07/21 1200) Pulse Rate:  [54-93] 84  (07/21 1100) Resp:  [14-20] 16  (07/21 0600) BP: (75-113)/(37-80) 112/62 mmHg (07/21 1210) SpO2:  [90 %-99 %] 97 % (07/21 1210)  Intake/Output from previous day: 07/20 0701 - 07/21 0700 In: 1129 [P.O.:240; I.V.:789; IV Piggyback:100] Out: 1350 [Urine:1350] Intake/Output from this shift: Total I/O In: 29 [I.V.:29] Out: 450 [Urine:450]  Physical Exam: Neck: no adenopathy, no carotid bruit, no JVD and supple, symmetrical, trachea midline Lungs: clear to auscultation bilaterally Heart: irregularly irregular rhythm, S1, S2 normal and Soft systolic murmur noted Abdomen: soft, non-tender; bowel sounds normal; no masses,  no organomegaly Extremities: extremities normal, atraumatic, no cyanosis or edema  Lab Results:  Basename 06/15/12 0500 06/14/12 0530  WBC 11.0* 11.4*  HGB 13.0 13.8  PLT 214 232    Basename 06/15/12 0500 06/14/12 0530  NA 138 140  K 3.8 4.0  CL 103 103  CO2 26 28  GLUCOSE 101* 113*  BUN 14 13  CREATININE 0.96 0.95    Basename 06/15/12 0500 06/14/12 0530  TROPONINI 9.58* 12.64*   Hepatic Function Panel No results found for this basename: PROT,ALBUMIN,AST,ALT,ALKPHOS,BILITOT,BILIDIR,IBILI in the last 72 hours No results found for this basename: CHOL in the last 72 hours No results found for this basename: PROTIME in the last 72 hours  Imaging: Imaging results have been reviewed and No results found.  Cardiac Studies:  Assessment/Plan:  Acute inferior wall myocardial infarction status post aspiration thrombectomy/intracoronary ReoPro infusion/PTCA stenting to 100% occluded RCA with excellent results  Multivessel  coronary artery disease  New-onset A. fib  Morbid obesity  History of tobacco abuse  Positive family history of coronary artery disease  Plan  Continue present management  Check 2-D echo Will schedule for PCI to LAD and left circumflex on Tuesday. Discussed with patient and agrees  LOS: 3 days    Wilkins Elpers N 06/15/2012, 12:18 PM

## 2012-06-16 LAB — CBC
HCT: 40.7 % (ref 39.0–52.0)
MCHC: 31.9 g/dL (ref 30.0–36.0)
RDW: 14.3 % (ref 11.5–15.5)

## 2012-06-16 LAB — HEPARIN LEVEL (UNFRACTIONATED)
Heparin Unfractionated: 0.19 IU/mL — ABNORMAL LOW (ref 0.30–0.70)
Heparin Unfractionated: 0.53 IU/mL (ref 0.30–0.70)
Heparin Unfractionated: 0.79 IU/mL — ABNORMAL HIGH (ref 0.30–0.70)

## 2012-06-16 MED ORDER — SODIUM CHLORIDE 0.9 % IV SOLN
1.0000 mL/kg/h | INTRAVENOUS | Status: DC
  Administered 2012-06-16: 1 mL/kg/h via INTRAVENOUS

## 2012-06-16 MED ORDER — SODIUM CHLORIDE 0.9 % IJ SOLN
3.0000 mL | Freq: Two times a day (BID) | INTRAMUSCULAR | Status: DC
  Administered 2012-06-16: 3 mL via INTRAVENOUS

## 2012-06-16 MED ORDER — HEPARIN BOLUS VIA INFUSION
3000.0000 [IU] | Freq: Once | INTRAVENOUS | Status: AC
  Administered 2012-06-16: 3000 [IU] via INTRAVENOUS
  Filled 2012-06-16: qty 3000

## 2012-06-16 MED ORDER — HEPARIN (PORCINE) IN NACL 100-0.45 UNIT/ML-% IJ SOLN
2200.0000 [IU]/h | INTRAMUSCULAR | Status: DC
  Filled 2012-06-16 (×4): qty 250

## 2012-06-16 MED ORDER — ASPIRIN 81 MG PO CHEW
324.0000 mg | CHEWABLE_TABLET | ORAL | Status: AC
  Administered 2012-06-17: 324 mg via ORAL
  Filled 2012-06-16: qty 1
  Filled 2012-06-16: qty 4

## 2012-06-16 MED ORDER — DIAZEPAM 5 MG PO TABS
5.0000 mg | ORAL_TABLET | ORAL | Status: AC
  Administered 2012-06-17: 5 mg via ORAL
  Filled 2012-06-16: qty 1

## 2012-06-16 MED ORDER — SODIUM CHLORIDE 0.9 % IJ SOLN: 3.0000 mL | INTRAMUSCULAR | Status: DC | PRN

## 2012-06-16 MED ORDER — SODIUM CHLORIDE 0.9 % IV SOLN: 250.0000 mL | INTRAVENOUS | Status: DC | PRN

## 2012-06-16 NOTE — Progress Notes (Signed)
  Echocardiogram 2D Echocardiogram has been performed.  Jeffrey Larson [name] 06/16/2012, 6:52 PM

## 2012-06-16 NOTE — Progress Notes (Signed)
ANTICOAGULATION CONSULT NOTE - Follow Up Consult  Pharmacy Consult for heparin Indication: chest pain/ACS  No Known Allergies  Patient Measurements: Height: 5\' 11"  (180.3 cm) Weight: 311 lb 1.1 oz (141.1 kg) IBW/kg (Calculated) : 75.3  Heparin Dosing Weight: 108 kg  Vital Signs: Temp: 98.4 F (36.9 C) (07/22 1521) Temp src: Oral (07/22 0743) BP: 122/70 mmHg (07/22 1315)  Labs:  Basename 06/16/12 1512 06/16/12 0516 06/15/12 1559 06/15/12 0500 06/14/12 0530  HGB -- 13.0 -- 13.0 --  HCT -- 40.7 -- 40.3 42.9  PLT -- 248 -- 214 232  APTT -- -- -- -- --  LABPROT -- -- -- -- --  INR -- -- -- -- --  HEPARINUNFRC 0.53 0.19* 0.21* -- --  CREATININE -- -- -- 0.96 0.95  CKTOTAL -- -- -- 193 584*  CKMB -- -- -- 7.4* 37.8*  TROPONINI -- -- -- 9.58* 12.64*    Estimated Creatinine Clearance: 124.9 ml/min (by C-G formula based on Cr of 0.96).   Assessment: 79 YOM with Acute inferior wall MI s/post aspirationthrombectomy/intracoronary reopro infusion on 07/18.  Remains in afib. To have PCI to LAD and left circumflex Tuesday.   Heparin level = 0.53 is therapeutic after additional bolus and rate increase this AM, no bleeding reported.   Goal of Therapy:  Heparin level 0.3-0.7 units/ml Monitor platelets by anticoagulation protocol: Yes   Plan:  - Continue heparin drip at 2400 units/hr - Recheck heparin level at 2200 to confirm - f/u plans after cath tomorrow  Bayard Hugger, PharmD, BCPS  Clinical Pharmacist  Pager: [phone]   06/16/2012 3:53 PM

## 2012-06-16 NOTE — Progress Notes (Signed)
ANTICOAGULATION CONSULT NOTE - Follow Up Consult  Pharmacy Consult for heparin Indication: chest pain/ACS  No Known Allergies  Patient Measurements: Height: 5\' 11"  (180.3 cm) Weight: 311 lb 1.1 oz (141.1 kg) IBW/kg (Calculated) : 75.3  Heparin Dosing Weight: 108 kg  Vital Signs: Temp: 98.8 F (37.1 C) (07/22 1919) BP: 102/60 mmHg (07/22 2130) Pulse Rate: 93  (07/22 2130)  Labs:  Basename 06/16/12 2140 06/16/12 1512 06/16/12 0516 06/15/12 0500 06/14/12 0530  HGB -- -- 13.0 13.0 --  HCT -- -- 40.7 40.3 42.9  PLT -- -- 248 214 232  APTT -- -- -- -- --  LABPROT -- -- -- -- --  INR -- -- -- -- --  HEPARINUNFRC 0.79* 0.53 0.19* -- --  CREATININE -- -- -- 0.96 0.95  CKTOTAL -- -- -- 193 584*  CKMB -- -- -- 7.4* 37.8*  TROPONINI -- -- -- 9.58* 12.64*    Estimated Creatinine Clearance: 124.9 ml/min (by C-G formula based on Cr of 0.96).   Assessment: Acute inferior wall MI s/post aspirationthrombectomy/intracoronary reopro infusion on 07/18.  Remains in afib with controlled ventricular response. To have PCI to LAD and left circumflex Tuesday.  H/H and PLTC stable with no bleeding reported. Heparin level supra-therapeutic after increase. Per RN no bleeding or issues with the line.   Goal of Therapy:  Heparin level 0.3-0.7 units/ml Monitor platelets by anticoagulation protocol: Yes   Plan:  1. Decrease heparin drip to 2200 units/hr 2. Check 6 hr HL (AM level).  3. Daily HL and CBC  Link Snuffer, PharmD, BCPS Clinical Pharmacist [phone]  06/16/2012 10:33 PM

## 2012-06-16 NOTE — Progress Notes (Signed)
CARDIAC REHAB PHASE I   PRE:  Rate/Rhythm: 91 afib    BP: sitting 122/70    SaO2: 99 RA  MODE:  Ambulation: 700 ft   POST:  Rate/Rhythm: 132 afib    BP: sitting 115/66     SaO2: 99 RA  Tolerated well. HR up and more SOB. Exerted after walk. Return to recliner. Discussed CRPII and gave financial aid application. Will send referral. Pt sts he is motivated to lose weight and ex. Will f/u. [phone]  Harriet Masson CES, ACSM

## 2012-06-16 NOTE — Progress Notes (Signed)
Subjective:  Patient denies any chest pain or shortness of breath. Remains in A. fib with controlled ventricular response.  Objective:  Vital Signs in the last 24 hours: Temp:  [98.2 F (36.8 C)-98.6 F (37 C)] 98.2 F (36.8 C) (07/22 0743) Pulse Rate:  [82-84] 82  (07/22 0000) Resp:  [16-20] 16  (07/22 0743) BP: (87-109)/(27-65) 100/65 mmHg (07/22 0340) SpO2:  [95 %-98 %] 95 % (07/22 0340)  Intake/Output from previous day: 07/21 0701 - 07/22 0700 In: 2255.5 [P.O.:1560; I.V.:695.5] Out: 2375 [Urine:2375] Intake/Output from this shift: Total I/O In: 92 [I.V.:92] Out: 400 [Urine:400]  Physical Exam: Neck: no adenopathy, no carotid bruit, no JVD and supple, symmetrical, trachea midline Lungs: clear to auscultation bilaterally Heart: irregularly irregular rhythm, S1, S2 normal and Soft systolic murmur noted no S3 gallop Abdomen: soft, non-tender; bowel sounds normal; no masses,  no organomegaly Extremities: extremities normal, atraumatic, no cyanosis or edema and Right groin stable  Lab Results:  Basename 06/16/12 0516 06/15/12 0500  WBC 10.7* 11.0*  HGB 13.0 13.0  PLT 248 214    Basename 06/15/12 0500 06/14/12 0530  NA 138 140  K 3.8 4.0  CL 103 103  CO2 26 28  GLUCOSE 101* 113*  BUN 14 13  CREATININE 0.96 0.95    Basename 06/15/12 0500 06/14/12 0530  TROPONINI 9.58* 12.64*   Hepatic Function Panel No results found for this basename: PROT,ALBUMIN,AST,ALT,ALKPHOS,BILITOT,BILIDIR,IBILI in the last 72 hours No results found for this basename: CHOL in the last 72 hours No results found for this basename: PROTIME in the last 72 hours  Imaging: Imaging results have been reviewed and No results found.  Cardiac Studies:  Assessment/Plan:  Acute inferior wall myocardial infarction status post aspiration thrombectomy/intracoronary ReoPro infusion/PTCA stenting to 100% occluded RCA with excellent results  Multivessel coronary artery disease  New-onset A. fib  Morbid  obesity  History of tobacco abuse  Positive family history of coronary artery disease  Plan  Continue present management  Schedule for PTCA stenting to LAD and left circumflex in a.m. discussed with patient at length regarding risk and benefits i.e. death MI stroke need for emergency CABG risk of restenosis local vascular complications etc. and consents for PCI  LOS: 4 days    Jeffrey Larson 06/16/2012, 12:10 PM

## 2012-06-16 NOTE — Progress Notes (Signed)
ANTICOAGULATION CONSULT NOTE - Follow Up Consult  Pharmacy Consult for heparin Indication: chest pain/ACS  No Known Allergies  Patient Measurements: Height: 5\' 11"  (180.3 cm) Weight: 311 lb 1.1 oz (141.1 kg) IBW/kg (Calculated) : 75.3  Heparin Dosing Weight: 108 kg  Vital Signs: Temp: 98.2 F (36.8 C) (07/22 0743) Temp src: Oral (07/22 0743) BP: 100/65 mmHg (07/22 0340) Pulse Rate: 82  (07/22 0000)  Labs:  Basename 06/16/12 0516 06/15/12 1559 06/15/12 0500 06/14/12 0530 06/13/12 1002  HGB 13.0 -- 13.0 -- --  HCT 40.7 -- 40.3 42.9 --  PLT 248 -- 214 232 --  APTT -- -- -- -- --  LABPROT -- -- -- -- --  INR -- -- -- -- --  HEPARINUNFRC 0.19* 0.21* 0.72* -- --  CREATININE -- -- 0.96 0.95 --  CKTOTAL -- -- 193 584* 1228*  CKMB -- -- 7.4* 37.8* 119.3*  TROPONINI -- -- 9.58* 12.64* >20.00*    Estimated Creatinine Clearance: 124.9 ml/min (by C-G formula based on Cr of 0.96).   Assessment: Acute inferior wall MI s/post aspirationthrombectomy/intracoronary reopro infusion on 07/18.  Remains in afib with controlled ventricular response. To have PCI to LAD and left circumflex Tuesday.   AM HL 0.19 on 2000 units/hr. HL < goal. RN checked IV site and flushed it. IV sites appears to be intact with no infiltrate.   H/H and PLTC stable with no bleeding reported. Heparin was initially ordered without bolus but now he is 4 days s/p procedure.    Goal of Therapy:  Heparin level 0.3-0.7 units/ml Monitor platelets by anticoagulation protocol: Yes   Plan:  1. Heparin bolus 3000 units IV x1 2. Increase heparin drip to 2400 units/hr 3. Check 6 hr HL 4. Daily HL and CBC Herby Abraham, Pharm.D. [phone] 06/16/2012 8:43 AM

## 2012-06-17 ENCOUNTER — Encounter (HOSPITAL_COMMUNITY)
Admission: EM | Disposition: A | Discharge status: Home / Self Care | Payer: Self-pay | Source: Other Acute Inpatient Hospital | Attending: Cardiology

## 2012-06-17 HISTORY — PX: PERCUTANEOUS CORONARY STENT INTERVENTION (PCI-S): SHX5485

## 2012-06-17 LAB — CBC
HCT: 38.9 % — ABNORMAL LOW (ref 39.0–52.0)
MCHC: 32.1 g/dL (ref 30.0–36.0)
Platelets: 255 10*3/uL (ref 150–400)
RDW: 14.3 % (ref 11.5–15.5)
WBC: 11.1 10*3/uL — ABNORMAL HIGH (ref 4.0–10.5)

## 2012-06-17 LAB — CARDIAC PANEL(CRET KIN+CKTOT+MB+TROPI)
CK, MB: 2.4 ng/mL (ref 0.3–4.0)
Relative Index: INVALID (ref 0.0–2.5)
Total CK: 90 U/L (ref 7–232)
Troponin I: 1.91 ng/mL (ref <0.30)

## 2012-06-17 LAB — BASIC METABOLIC PANEL
BUN: 13 mg/dL (ref 6–23)
Chloride: 107 mEq/L (ref 96–112)
GFR calc Af Amer: 87 mL/min — ABNORMAL LOW (ref >90)
GFR calc non Af Amer: 75 mL/min — ABNORMAL LOW (ref >90)
Potassium: 4.3 mEq/L (ref 3.5–5.1)
Sodium: 143 mEq/L (ref 135–145)

## 2012-06-17 LAB — HEPARIN LEVEL (UNFRACTIONATED): Heparin Unfractionated: 0.51 IU/mL (ref 0.30–0.70)

## 2012-06-17 SURGERY — PERCUTANEOUS CORONARY STENT INTERVENTION (PCI-S)
Anesthesia: LOCAL

## 2012-06-17 MED ORDER — BIVALIRUDIN 250 MG IV SOLR
INTRAVENOUS | Status: AC
  Filled 2012-06-17: qty 250

## 2012-06-17 MED ORDER — MIDAZOLAM HCL 2 MG/2ML IJ SOLN
INTRAMUSCULAR | Status: AC
  Filled 2012-06-17: qty 2

## 2012-06-17 MED ORDER — TICAGRELOR 90 MG PO TABS
ORAL_TABLET | ORAL | Status: AC
  Filled 2012-06-17: qty 1

## 2012-06-17 MED ORDER — ONDANSETRON HCL 4 MG/2ML IJ SOLN: 4.0000 mg | Freq: Four times a day (QID) | INTRAMUSCULAR | Status: DC | PRN

## 2012-06-17 MED ORDER — FENTANYL CITRATE 0.05 MG/ML IJ SOLN
INTRAMUSCULAR | Status: AC
  Filled 2012-06-17: qty 2

## 2012-06-17 MED ORDER — HEPARIN (PORCINE) IN NACL 2-0.9 UNIT/ML-% IJ SOLN
INTRAMUSCULAR | Status: AC
  Filled 2012-06-17: qty 2000

## 2012-06-17 MED ORDER — SODIUM CHLORIDE 0.9 % IV SOLN
0.2500 mg/kg/h | INTRAVENOUS | Status: DC
  Administered 2012-06-17: 0.25 mg/kg/h via INTRAVENOUS
  Filled 2012-06-17: qty 250

## 2012-06-17 MED ORDER — LOPERAMIDE HCL 2 MG PO CAPS
4.0000 mg | ORAL_CAPSULE | ORAL | Status: DC | PRN
  Administered 2012-06-17 (×2): 4 mg via ORAL
  Filled 2012-06-17 (×2): qty 2

## 2012-06-17 MED ORDER — APIXABAN 5 MG PO TABS
5.0000 mg | ORAL_TABLET | Freq: Two times a day (BID) | ORAL | Status: DC
  Administered 2012-06-17 – 2012-06-18 (×2): 5 mg via ORAL
  Filled 2012-06-17 (×4): qty 1

## 2012-06-17 MED ORDER — LIDOCAINE HCL (PF) 1 % IJ SOLN
INTRAMUSCULAR | Status: AC
  Filled 2012-06-17: qty 30

## 2012-06-17 MED ORDER — NITROGLYCERIN 0.2 MG/ML ON CALL CATH LAB
INTRAVENOUS | Status: AC
  Filled 2012-06-17: qty 1

## 2012-06-17 MED ORDER — BIVALIRUDIN 250 MG IV SOLR
0.2500 mg/kg | INTRAVENOUS | Status: AC
  Filled 2012-06-17: qty 250

## 2012-06-17 MED ORDER — FAMOTIDINE IN NACL 20-0.9 MG/50ML-% IV SOLN
20.0000 mg | Freq: Once | INTRAVENOUS | Status: AC
  Administered 2012-06-17: 20 mg via INTRAVENOUS

## 2012-06-17 MED ORDER — SODIUM CHLORIDE 0.9 % IV SOLN
0.2500 mg/kg/h | INTRAVENOUS | Status: DC
  Filled 2012-06-17: qty 250

## 2012-06-17 MED ORDER — HEART ATTACK BOUNCING BOOK
Freq: Once | Status: AC
  Administered 2012-06-17: 23:00:00
  Filled 2012-06-17: qty 1

## 2012-06-17 MED ORDER — NITROGLYCERIN IN D5W 200-5 MCG/ML-% IV SOLN
5.0000 ug/min | INTRAVENOUS | Status: DC
  Administered 2012-06-17: 5 ug/min via INTRAVENOUS

## 2012-06-17 MED ORDER — FAMOTIDINE IN NACL 20-0.9 MG/50ML-% IV SOLN
INTRAVENOUS | Status: AC
  Filled 2012-06-17: qty 50

## 2012-06-17 MED ORDER — NITROGLYCERIN IN D5W 200-5 MCG/ML-% IV SOLN
INTRAVENOUS | Status: AC
  Filled 2012-06-17: qty 250

## 2012-06-17 MED ORDER — SODIUM CHLORIDE 0.9 % IV SOLN
INTRAVENOUS | Status: AC
  Administered 2012-06-17: 16:00:00 via INTRAVENOUS

## 2012-06-17 MED ORDER — ACETAMINOPHEN 325 MG PO TABS: 650.0000 mg | ORAL_TABLET | ORAL | Status: DC | PRN

## 2012-06-17 MED ORDER — ONDANSETRON HCL 4 MG/2ML IJ SOLN
INTRAMUSCULAR | Status: AC
  Filled 2012-06-17: qty 2

## 2012-06-17 MED FILL — Atorvastatin Calcium Tab 80 MG (Base Equivalent): ORAL | Qty: 1 | Status: AC

## 2012-06-17 MED FILL — Ticagrelor Tab 90 MG: ORAL | Qty: 1 | Status: AC

## 2012-06-17 MED FILL — Metoprolol Tartrate Tab 25 MG: ORAL | Qty: 1 | Status: AC

## 2012-06-17 MED FILL — Amiodarone HCl Tab 200 MG: ORAL | Qty: 1 | Status: AC

## 2012-06-17 NOTE — Progress Notes (Signed)
UR Completed Mega Kinkade Graves-Bigelow, RN,BSN [phone]  

## 2012-06-17 NOTE — Progress Notes (Signed)
Subjective:  Patient denies any chest pain or shortness of breath states feels better after PCI Objective:  Vital Signs in the last 24 hours: Temp:  [98 F (36.7 C)-98.8 F (37.1 C)] 98 F (36.7 C) (07/23 1721) Pulse Rate:  [43-145] 97  (07/23 1721) Resp:  [20] 20  (07/23 1721) BP: (98-119)/(57-96) 113/75 mmHg (07/23 1721) SpO2:  [94 %-99 %] 98 % (07/23 1721)  Intake/Output from previous day: 07/22 0701 - 07/23 0700 In: 1841.1 [I.V.:1839.1; IV Piggyback:2] Out: 1450 [Urine:1450] Intake/Output from this shift: Total I/O In: 240 [P.O.:240] Out: 600 [Urine:600]  Physical Exam: Neck: no adenopathy, no carotid bruit, no JVD and supple, symmetrical, trachea midline Lungs: clear to auscultation bilaterally Heart: irregularly irregular rhythm, S1, S2 normal and Soft systolic murmur noted no S3 gallop Abdomen: soft, non-tender; bowel sounds normal; no masses,  no organomegaly Extremities no clubbing cyanosis or edema right groin is stable Lab Results:  Basename 06/17/12 0515 06/16/12 0516  WBC 11.1* 10.7*  HGB 12.5* 13.0  PLT 255 248    Basename 06/17/12 0515 06/15/12 0500  NA 143 138  K 4.3 3.8  CL 107 103  CO2 27 26  GLUCOSE 120* 101*  BUN 13 14  CREATININE 1.08 0.96    Basename 06/17/12 0515 06/15/12 0500  TROPONINI 1.91* 9.58*   Hepatic Function Panel No results found for this basename: PROT,ALBUMIN,AST,ALT,ALKPHOS,BILITOT,BILIDIR,IBILI in the last 72 hours No results found for this basename: CHOL in the last 72 hours No results found for this basename: PROTIME in the last 72 hours  Imaging: Imaging results have been reviewed and No results found.  Cardiac Studies:  Assessment/Plan:  Acute inferior wall myocardial infarction status post aspiration thrombectomy/intracoronary ReoPro infusion/PTCA stenting to 100% occluded RCA with excellent results  Multivessel coronary artery disease status post PTCA stenting to LAD and left circumflex Probable chronic atrial  fibrillation  Morbid obesity  History of tobacco abuse  Positive family history of coronary artery disease  Hypercholesteremia Glucose intolerance Plan  Continue present management  We'll start Apixaban tonight 5 mg twice a day. Possible discharge tomorrow if stable  LOS: 5 days    Renly Roots N 06/17/2012, 6:00 PM

## 2012-06-17 NOTE — H&P (Signed)
  No change in the H&P 

## 2012-06-17 NOTE — CV Procedure (Signed)
Left cath/PTCA stenting report dictated on 06/17/2012 dictation number is [id number]

## 2012-06-17 NOTE — Cardiovascular Report (Signed)
Jeffrey Larson, Jeffrey Larson              ACCOUNT NO.:  [id number]  MEDICAL RECORD NO.:  [id number]  LOCATION:  [facility]                         FACILITY:  [facility]  PHYSICIAN:  Clessie Karras N. Sharyn Lull, M.D. DATE OF BIRTH:  [birth year]  DATE OF PROCEDURE:  06/17/2012 DATE OF DISCHARGE:                           CARDIAC CATHETERIZATION   PROCEDURES: 1. Left cardiac cath with selective left and right coronary     angiography via right groin using Judkins technique. 2. Successful PTCA to proximal and mid junction of LAD using 2.5 x 12     mm long Emerge balloon for predilatation. 3. Successful deployment of 3.5 x 23-mm long Xience Xpedition drug-     eluting stent in proximal and mid junction of LAD. 4. Successful postdilatation of this stent using 3.5 x 12 mm long [place]     TREK balloon going up to 18-20 atmospheric pressure. 5. Successful PTCA to proximal and mid junction of left circumflex     using same 2.5 x 12 mm long Emerge balloon. 6. Successful deployment of 3.25 x 23 mm long Xience Xpedition drug-     eluting stent in proximal and mid junction of left circumflex. 7. Successful postdilatation of this stent using 3.5 x 12 mm long [place]     TREK balloon going up to 13 atmospheric pressure.  INDICATION FOR THE PROCEDURE:  Jeffrey Larson is a 56 year old male with no significant past medical history except for tobacco abuse, morbid obesity, and strong family history of coronary artery disease.  He came to [facility] by [facility] on June 12, 2012, complaining of retrosternal chest pain described as pressure, associated with nausea and mild shortness of breath.  EKG showed acute inferior wall MI.  Subsequently underwent emergency PTCA stenting to proximal 100% occluded LAD with proximal RCA with excellent results.  The patient postoperatively remained in atrial fibrillation, was started on IV heparin and IV amiodarone, but has remained in AFib.  The patient is brought to the cath lab for  staged PCI to LAD and left circumflex.  PROCEDURE IN DETAIL:  After obtaining the informed consent, the patient was brought to the cath lab and was placed on fluoroscopy table.  Right groin was prepped and draped in usual fashion.  Xylocaine 1% was used for local anesthesia in the right groin.  With the help of thin wall needle, 6-French arterial sheath was placed.  The sheath was aspirated and flushed.  Next, 6-French right Judkins catheter was advanced over the wire and under fluoroscopic guidance up to the ascending aorta. Wire was pulled out.  The catheter was aspirated and connected to the Manifold.  Catheter was further advanced and engaged into right coronary ostium.  A single view of right coronary artery was obtained.  Next, the catheter was disengaged and was pulled out over the wire and was replaced with a 6-French XB LAD guiding catheter, which was advanced over the wire under fluoroscopic guidance up to the ascending aorta. Wire was pulled out.  The catheter was aspirated and connected to the Manifold.  Catheter was further advanced and engaged into left coronary ostium.  Multiple views of the left system were taken.  FINDINGS:  RCA stent had 10-15% proximal stenosis and 20-25% mid stenosis with TIMI grade 3 distal flow.  Left main was patent.  LAD has proximal 75-80% stenosis and left circumflex also has 75-80% proximal and mid junction stenosis as before.  INTERVENTIONAL PROCEDURE:  Successful PTCA to proximal and mid junction LAD was done using 2.5 x 12 mm long Emerge balloon for predilatation and then 3.5 x 23 mm long Xience Xpedition drug-eluting stent was deployed at 13 atmospheric pressure.  The stent was postdilated using 3.5 x 12 mm long [place] TREK balloon going up to 18 and 20 atmospheric pressure.  Lesion dilated from 75-80% to 0% residual with excellent TIMI grade 3 distal flow without evidence of dissection or distal embolization.  Net, PTCA to proximal and mid  junction of the left circumflex was done using 2.5 x 12 mm long, Emerge balloon for predilatation and then 3.25 x 23 mm long Xience Xpedition drug-eluting stent was deployed in proximal and mid junction of left circumflex at 11 atmospheric pressure.  This stent was post dilated using 3.5 x 12 mm long [place] TREK balloon going up to 13 atmospheric pressure.  Lesion dilated from 75-80% to 0% residual with excellent TIMI grade 3 distal flow without evidence of dissection or distal embolization.  The patient received weight based Angiomax and Brilinta prior to the procedure.  The patient tolerated the procedure well.  There were no complications.  The patient was transferred to recovery room in stable condition.     Jeffrey Larson. Sharyn Lull, M.D.     MNH/MEDQ  D:  06/17/2012  T:  06/17/2012  Job:  [id number]

## 2012-06-17 NOTE — Progress Notes (Signed)
Nursing Note:  pt's right groin and right radial/wrist area shaved; pt states that he is a little nervous; states that he has had 2 very loose, water BMs; Loperamide 4mg  given PO; cath lab called and Valium 5mg  PO given; Heparin turned off.  Family at bedside being supportive.

## 2012-06-18 LAB — CBC
HCT: 36.1 % — ABNORMAL LOW (ref 39.0–52.0)
MCH: 28.1 pg (ref 26.0–34.0)
MCHC: 32.4 g/dL (ref 30.0–36.0)
RDW: 14.5 % (ref 11.5–15.5)

## 2012-06-18 LAB — BASIC METABOLIC PANEL
BUN: 10 mg/dL (ref 6–23)
Calcium: 8.6 mg/dL (ref 8.4–10.5)
Creatinine, Ser: 0.95 mg/dL (ref 0.50–1.35)
GFR calc Af Amer: >90 mL/min (ref >90)
GFR calc non Af Amer: >90 mL/min (ref >90)
Potassium: 3.9 mEq/L (ref 3.5–5.1)

## 2012-06-18 MED ORDER — NITROGLYCERIN 0.4 MG SL SUBL: 0.4000 mg | SUBLINGUAL_TABLET | SUBLINGUAL | Status: DC | PRN

## 2012-06-18 MED ORDER — TICAGRELOR 90 MG PO TABS: 90.0000 mg | ORAL_TABLET | Freq: Two times a day (BID) | ORAL | Status: DC

## 2012-06-18 MED ORDER — AMIODARONE HCL 200 MG PO TABS: 200.0000 mg | ORAL_TABLET | Freq: Two times a day (BID) | ORAL | Status: DC

## 2012-06-18 MED ORDER — PANTOPRAZOLE SODIUM 40 MG PO TBEC: 40.0000 mg | DELAYED_RELEASE_TABLET | Freq: Every day | ORAL | Status: DC

## 2012-06-18 MED ORDER — APIXABAN 5 MG PO TABS: 5.0000 mg | ORAL_TABLET | Freq: Two times a day (BID) | ORAL | Status: DC

## 2012-06-18 MED ORDER — METOPROLOL TARTRATE 12.5 MG HALF TABLET: 12.5000 mg | ORAL_TABLET | Freq: Two times a day (BID) | ORAL | Status: DC

## 2012-06-18 MED ORDER — ASPIRIN 81 MG PO TBEC: 81.0000 mg | DELAYED_RELEASE_TABLET | Freq: Every day | ORAL | Status: AC

## 2012-06-18 MED ORDER — ATORVASTATIN CALCIUM 80 MG PO TABS: 80.0000 mg | ORAL_TABLET | Freq: Every day | ORAL | Status: DC

## 2012-06-18 MED ORDER — RAMIPRIL 2.5 MG PO CAPS: 2.5000 mg | ORAL_CAPSULE | Freq: Every day | ORAL | Status: DC

## 2012-06-18 MED FILL — Dextrose Inj 5%: INTRAVENOUS | Qty: 1000 | Status: AC

## 2012-06-18 NOTE — Discharge Summary (Signed)
NAMESABASTIAN, RAIMONDI              ACCOUNT NO.:  [id number]  MEDICAL RECORD NO.:  [id number]  LOCATION:  6523                         FACILITY:  [facility]  PHYSICIAN:  Thurlow Gallaga N. Sharyn Lull, M.D. DATE OF BIRTH:  02-26-1956  DATE OF ADMISSION:  06/12/2012 DATE OF DISCHARGE:  06/18/2012                              DISCHARGE SUMMARY   ADMITTING DIAGNOSES: 1. Acute inferior wall myocardial infarction. 2. Morbid obesity. 3. History of tobacco abuse. 4. Positive family history of coronary artery disease.  DISCHARGE DIAGNOSES: 1. Status post inferior wall myocardial infarction, status post     aspiration thrombectomy/intracoronary ReoPro infusion/percutaneous     transluminal coronary angioplasty with stenting to 100% occluded     right coronary artery. 2. Multivessel coronary artery disease, status post staged     percutaneous transluminal coronary angioplasty with stenting to     left anterior descending and left circumflex on July 23. 3. Chronic atrial fibrillation. 4. Morbid obesity. 5. Hypertension. 6. Glucose intolerance. 7. History of tobacco abuse. 8. Hypercholesteremia. 9. Positive family history of coronary artery disease.  DISCHARGE HOME MEDICATIONS: 1. Amiodarone 200 mg 1 tablet twice daily. 2. Apixaban 5 mg 1 tablet twice daily. 3. Enteric-coated aspirin 81 mg 1 tablet daily. 4. Atorvastatin 80 mg 1 tablet daily. 5. Metoprolol tartrate 12.5 mg twice daily. 6. Nitrostat sublingual use as directed. 7. Protonix 40 mg 1 tablet daily. 8. Ramipril 2.5 mg 1 capsule daily. 9. Brilinta 90 mg 1 tablet twice daily. 10.Albuterol inhaler 2 puffs twice daily as needed.  The patient has     been advised to stop Aleve.  DIET:  Low-salt, low-cholesterol, 1800 calories, ADA diet.  INSTRUCTIONS:  The patient has been advised to reduce weight.  The patient will be scheduled for phase 2 cardiac rehab as outpatient. Condition on discharge stable.  Post PTCA stent instructions have  been given.  Follow up with me in 1 week.  BRIEF HISTORY:  Mr. Devins is a 56 year old male with no significant past medical history except for tobacco abuse and a strong family history of coronary artery disease and morbid obesity.  He came to [facility] by EMS complaining of retrosternal chest pain described as pressure, associated with nausea and mild shortness of breath.  EKG done on the field showed normal sinus rhythm with ST elevation in leads II, III, aVF, and reciprocal changes in lead I and aVL suggestive of acute inferior wall injury.  The patient states chest pain started approximately 45 minutes prior to calling EMS.  The patient denies any such episodes of chest pain in the past.  Denies any palpitations, lightheadedness, or syncope.  Denies PND, orthopnea, or leg swelling. The code STEMI was called and the patient was directly brought to the cath lab.  PHYSICAL EXAMINATION:  GENERAL:  He was alert, awake, oriented x3, in no acute distress. HEENT:  Conjunctivae was pink. NECK:  Supple.  No JVD.  No bruit. LUNGS:  Clear to auscultation without rhonchi, rales. CARDIOVASCULAR EXAM:  S1, S2 was normal.  There was soft systolic murmur and S4 gallop. ABDOMEN: Soft.  Bowel sounds were present.  Mildly distended, obese, nontender. EXTREMITIES:  No clubbing, cyanosis, or edema.  LABORATORY DATA:  CPK was 1425, MB 132.  Next set CK 1283, MB 127. Next set CK 1228, MB 119. Set CK 584, MB 37.8.  Yesterday's CK 90, MB 2.4, which is trending down. His troponin I 1st set was about 20.  Next 2 sets were about 20.  Next was 12.64. Next Troponin I was 9.58.  Yesterday's troponin I was 1.91. Hemoglobin was 14, hematocrit 42.6, white count of 15.4. Yesterday's hemoglobin was 11.7, hematocrit 36.1, white count of 11.8, which has been stable.  Sodium was 139, potassium 3.9, BUN 10, creatinine 0.95. Fasting blood sugar was 149.  Repeat fasting sugar was 113, 101. Yesterday  fasting blood sugar was 97.  His TSH was 0.48.  Repeat EKG showed atrial fibrillation with controlled ventricular response with nonspecific ST-T wave changes.  BRIEF HOSPITAL COURSE:  The patient was directly brought to the [facility] and underwent emergency left cath and aspiration thrombectomy and intracoronary ReoPro infusion and PTCA with stenting to 100% occluded RCA with excellent results.  The patient was transferred to CCU in stable condition, remained in AFib with controlled ventricular response despite receiving multiple boluses of IV amiodarone.  The patient was noted to have 3-vessel coronary artery disease.  The patient remained in atrial fibrillation during the hospital stay and was started on IV heparin and subsequently underwent PTCA with stenting to LAD and left circumflex yesterday.  The patient tolerated procedure well.  There were no complications.  The patient's heparin was switched to Eliquis i.e. apixaban 5 mg twice daily, which he is tolerating well.  His groin is stable with no evidence of hematoma or bruit.  The patient had occasional episodes of AFib with RVR with exertion.  His heart rate is well controlled at this point in 70s.  The patient will be discharged home on above medications and will be followed up in my office in 1 week.     Eduardo Osier. Sharyn Lull, M.D.     MNH/MEDQ  D:  06/18/2012  T:  06/18/2012  Job:  [id number]

## 2012-06-18 NOTE — Progress Notes (Signed)
CARDIAC REHAB PHASE I   PRE:  Rate/Rhythm: 103 afib  BP:  Supine:   Sitting: 108/60  Standing:    SaO2:   MODE:  Ambulation: 500 ft   POST:  Rate/Rhythem: 143afib highest rate during walk  (SOB)  BP:  Supine:   Sitting: 110/69  Standing:    SaO2:  0930-1000 Pt walked 500 ft with steady gait. C/o SOB when heart rate hit 143 afib. Reviewed ex ed with pt encouraging him to stop and rest if and when he feels SOB. Referring to [place] Phase 2.   Duanne Limerick

## 2012-06-18 NOTE — Discharge Summary (Signed)
  Discharge summary dictated on 06/18/2012 dictation number is [id number]

## 2012-06-26 ENCOUNTER — Encounter (HOSPITAL_COMMUNITY): Payer: Self-pay | Admitting: *Deleted

## 2012-06-26 ENCOUNTER — Emergency Department (HOSPITAL_COMMUNITY)
Admission: EM | Admit: 2012-06-26 | Discharge: 2012-06-26 | Disposition: A | Discharge status: Home / Self Care | Payer: Self-pay | Attending: Emergency Medicine | Admitting: Emergency Medicine

## 2012-06-26 DIAGNOSIS — Z9861 Coronary angioplasty status: Secondary | ICD-10-CM | POA: Insufficient documentation

## 2012-06-26 DIAGNOSIS — R040 Epistaxis: Secondary | ICD-10-CM

## 2012-06-26 DIAGNOSIS — I252 Old myocardial infarction: Secondary | ICD-10-CM | POA: Insufficient documentation

## 2012-06-26 DIAGNOSIS — Z87891 Personal history of nicotine dependence: Secondary | ICD-10-CM | POA: Insufficient documentation

## 2012-06-26 DIAGNOSIS — I2510 Atherosclerotic heart disease of native coronary artery without angina pectoris: Secondary | ICD-10-CM | POA: Insufficient documentation

## 2012-06-26 DIAGNOSIS — I4891 Unspecified atrial fibrillation: Secondary | ICD-10-CM | POA: Insufficient documentation

## 2012-06-26 LAB — CBC WITH DIFFERENTIAL/PLATELET
HCT: 43.2 % (ref 39.0–52.0)
Hemoglobin: 14.3 g/dL (ref 13.0–17.0)
Lymphs Abs: 1.9 10*3/uL (ref 0.7–4.0)
Monocytes Absolute: 0.9 10*3/uL (ref 0.1–1.0)
Monocytes Relative: 7 % (ref 3–12)
Neutro Abs: 9.7 10*3/uL — ABNORMAL HIGH (ref 1.7–7.7)
Neutrophils Relative %: 76 % (ref 43–77)
RBC: 5.03 MIL/uL (ref 4.22–5.81)

## 2012-06-26 LAB — PROTIME-INR
INR: 1.26 (ref 0.00–1.49)
Prothrombin Time: 16.1 seconds — ABNORMAL HIGH (ref 11.6–15.2)

## 2012-06-26 MED ORDER — SILVER NITRATE-POT NITRATE 75-25 % EX MISC
CUTANEOUS | Status: AC
  Administered 2012-06-26: 20:00:00
  Filled 2012-06-26: qty 1

## 2012-06-26 MED ORDER — COCAINE HCL 4 % EX SOLN
CUTANEOUS | Status: AC
  Administered 2012-06-26: 20:00:00
  Filled 2012-06-26: qty 4

## 2012-06-26 MED ORDER — OXYMETAZOLINE HCL 0.05 % NA SOLN
NASAL | Status: AC
  Administered 2012-06-26: 20:00:00
  Filled 2012-06-26: qty 15

## 2012-06-26 NOTE — ED Notes (Signed)
Pt was awoken from sleep with a nose bleed.  It stopped briefly, but started again.  Pt started taking coumadin yesterday at 6 pm for new dx of afib.  Pt had been on heparin in hospital.

## 2012-06-26 NOTE — ED Notes (Signed)
56 year old male with a history of recently starting Coumadin secondary to arrhythmia who presents with a complaint of right nosebleed. This was acute in onset, he admits to picking his nose yesterday because he had a scab, the bleeding started earlier today, was intermittent and mild. On exam the patient has after being treated with Silver nitrate by physician assistant eschcar to the right nasal trough and inferior septum, no active bleeding, oropharynx is clear, patient is comfortable and in no distress. Patient given instructions on how to deal with this at home, appear stable for discharge, labs reviewed, no anemia, no significant coagulopathy.  Medical screening examination/treatment/procedure(s) were conducted as a shared visit with non-physician practitioner(s) and myself.  I personally evaluated the patient during the encounter    Vida Roller, MD 06/26/12 [phone]

## 2012-06-26 NOTE — ED Provider Notes (Signed)
History  This chart was scribed for Jeffrey Baker, MD by [name]. This patient was seen in room APA08/APA08 and the patient's care was started at 1906.   CSN: [id number]  Arrival date & time 06/26/12  1906   First MD Initiated Contact with Patient 06/26/12 1949      Chief Complaint  Patient presents with  . Epistaxis   The history is provided by the patient. No language interpreter was used.   Jeffrey Larson is a 56 y.o. male who presents to the Emergency Department complaining of constant right sided epistaxis for the past two hours. He was seen las PM at [facility] and they treated this similar episode by cauterizing his nose and stopping his nose bleed. He states his nose started bleeding again two hours ago and has been applying pressure without improvement in his symptoms. He denies picking at his nose, any bruising, or previous epistaxis history.   Past Medical History  Diagnosis Date  . Coronary artery disease   . Myocardial infarction   . Afib     Past Surgical History  Procedure Date  . No past surgeries   . Coronary stent placement 7/12013    History reviewed. No pertinent family history.  History  Substance Use Topics  . Smoking status: Former Smoker -- 0 years    Types: Cigarettes    Quit date: 11/26/1996  . Smokeless tobacco: Not on file  . Alcohol Use: No      Review of Systems A complete 10 system review of systems was obtained and all systems are negative except as noted in the HPI and PMH.   Allergies  Review of patient's allergies indicates no known allergies.  Home Medications   Current Outpatient Rx  Name Route Sig Dispense Refill  . ALBUTEROL SULFATE HFA 108 (90 BASE) MCG/ACT IN AERS Inhalation Inhale 2 puffs into the lungs 2 (two) times daily.    . AMIODARONE HCL 200 MG PO TABS Oral Take 1 tablet (200 mg total) by mouth 2 (two) times daily. 60 tablet 3  . ASPIRIN 81 MG PO TBEC Oral Take 1 tablet (81 mg total) by mouth daily. 30 tablet 3   . ATORVASTATIN CALCIUM 80 MG PO TABS Oral Take 1 tablet (80 mg total) by mouth daily at 6 PM. 30 tablet 3  . METOPROLOL TARTRATE 25 MG PO TABS Oral Take 25 mg by mouth 2 (two) times daily.    Marland Kitchen NITROGLYCERIN 0.4 MG SL SUBL Sublingual Place 1 tablet (0.4 mg total) under the tongue every 5 (five) minutes x 3 doses as needed for chest pain. 25 tablet 3  . RAMIPRIL 2.5 MG PO CAPS Oral Take 1 capsule (2.5 mg total) by mouth daily. 30 capsule 3  . TICAGRELOR 90 MG PO TABS Oral Take 1 tablet (90 mg total) by mouth 2 (two) times daily. 60 tablet 11  . WARFARIN SODIUM 5 MG PO TABS Oral Take 5 mg by mouth daily.      Triage Vitals: BP 115/91  Pulse 50  Temp 98.6 F (37 C) (Oral)  Resp 20  Ht 5\' 11"  (1.803 m)  Wt 289 lb (131.09 kg)  BMI 40.31 kg/m2  SpO2 98%  Physical Exam  Nursing note and vitals reviewed. Constitutional: He is oriented to person, place, and time. He appears well-developed and well-nourished. No distress.  HENT:  Head: Normocephalic and atraumatic.       Bleeding from right kisselbacks plexus with prior cautery noted  Eyes:  EOM are normal.  Neck: Neck supple. No tracheal deviation present.  Cardiovascular: Normal rate.   Pulmonary/Chest: Effort normal. No respiratory distress.  Musculoskeletal: Normal range of motion.  Neurological: He is alert and oriented to person, place, and time.  Skin: Skin is warm and dry.  Psychiatric: He has a normal mood and affect. His behavior is normal.    ED Course  EPISTAXIS MANAGEMENT Date/Time: 06/26/2012 9:47 PM Performed by: Jeffrey Larson Authorized by: Lorre Nick T Consent: Verbal consent obtained. Risks and benefits: risks, benefits and alternatives were discussed Consent given by: patient Patient understanding: patient states understanding of the procedure being performed Required items: required blood products, implants, devices, and special equipment available Patient identity confirmed: verbally with patient Time  out: Immediately prior to procedure a "time out" was called to verify the correct patient, procedure, equipment, support staff and site/side marked as required. Local anesthetic: topical anesthetic Anesthetic total: 4 ml Patient sedated: no Treatment site: right anterior Repair method: silver nitrate and nasal balloon Post-procedure assessment: bleeding stopped Treatment complexity: complex Recurrence: recurrence of recent bleed Patient tolerance: Patient tolerated the procedure well with no immediate complications.   (including critical care time) DIAGNOSTIC STUDIES: Oxygen Saturation is 97% on room air, normal by my interpretation.    COORDINATION OF CARE: At 77 PM Discussed treatment plan with patient which includes . Patient agrees.   Labs Reviewed - No data to display No results found.   No diagnosis found.    MDM  Pts right ant septal nosebleed cauterized--pt monitored and no rebleeding. Pt to return in 48 hrs to have the packing removed  I personally performed the services described in this documentation, which was scribed in my presence. The recorded information has been reviewed and considered.         Jeffrey Baker, MD 06/26/12 [phone]

## 2012-06-26 NOTE — ED Notes (Signed)
Onset this am, seen at [facility] and cauterized.  Restarted 6 pm, from rt nostril.  Takes coumadin.

## 2012-06-26 NOTE — ED Provider Notes (Signed)
History     CSN: [id number]  Arrival date & time 06/26/12  0226   First MD Initiated Contact with Patient 06/26/12 [phone]      Chief Complaint  Patient presents with  . Epistaxis   HPI  History provided by the patient. Patient is a 56 year old male with history of atrial fibrillation and recently started on Coumadin who presents with complaints of right nosebleed for the past 2 hours. Patient reports waking up around 1 AM with nosebleed. Patient states bleeding stopped momentarily but then passed a large clot. If bleeding continues since that time. Patient denies feeling any lightheadedness, shortness of breath or near syncope. Patient has tried using pressure and pinching nose without improvement of symptoms. Patient was started on Coumadin 2 days ago and has taken a total of 2 doses. Patient reports taking 5 mg at 6 PM. He denies any other complaints at this time. He denies any history of trauma or injury to the nose that he is aware of.     Past Medical History  Diagnosis Date  . Coronary artery disease   . Myocardial infarction   . Afib     Past Surgical History  Procedure Date  . No past surgeries   . Coronary stent placement 7/12013    History reviewed. No pertinent family history.  History  Substance Use Topics  . Smoking status: Former Smoker -- 0 years    Types: Cigarettes    Quit date: 11/26/1996  . Smokeless tobacco: Not on file  . Alcohol Use: No      Review of Systems  HENT: Positive for nosebleeds.   Neurological: Negative for dizziness, syncope and light-headedness.    Allergies  Review of patient's allergies indicates no known allergies.  Home Medications   Current Outpatient Rx  Name Route Sig Dispense Refill  . AMIODARONE HCL 200 MG PO TABS Oral Take 1 tablet (200 mg total) by mouth 2 (two) times daily. 60 tablet 3  . ASPIRIN 81 MG PO TBEC Oral Take 1 tablet (81 mg total) by mouth daily. 30 tablet 3  . ATORVASTATIN CALCIUM 80 MG PO TABS Oral  Take 1 tablet (80 mg total) by mouth daily at 6 PM. 30 tablet 3  . METOPROLOL TARTRATE 25 MG PO TABS Oral Take 25 mg by mouth 2 (two) times daily.    Marland Kitchen RAMIPRIL 2.5 MG PO CAPS Oral Take 1 capsule (2.5 mg total) by mouth daily. 30 capsule 3  . TICAGRELOR 90 MG PO TABS Oral Take 1 tablet (90 mg total) by mouth 2 (two) times daily. 60 tablet 11  . WARFARIN SODIUM 5 MG PO TABS Oral Take 5 mg by mouth daily.    . ALBUTEROL SULFATE HFA 108 (90 BASE) MCG/ACT IN AERS Inhalation Inhale 2 puffs into the lungs 2 (two) times daily.    Marland Kitchen NITROGLYCERIN 0.4 MG SL SUBL Sublingual Place 1 tablet (0.4 mg total) under the tongue every 5 (five) minutes x 3 doses as needed for chest pain. 25 tablet 3    BP 132/92  Temp 98.3 F (36.8 C) (Oral)  Resp 18  SpO2 97%  Physical Exam  Nursing note and vitals reviewed. Constitutional: He is oriented to person, place, and time. He appears well-developed and well-nourished. No distress.  HENT:  Head: Normocephalic and atraumatic.       Small active bleed from the distal inferior right septal area on Kiesselbach plexus.  Cardiovascular: Normal rate.   Pulmonary/Chest: Effort normal and breath sounds  normal.  Neurological: He is alert and oriented to person, place, and time.  Skin: Skin is warm.  Psychiatric: He has a normal mood and affect. His behavior is normal.    ED Course  EPISTAXIS MANAGEMENT Date/Time: 06/26/2012 4:00 AM Performed by: Angus Seller Authorized by: Angus Seller Consent: Verbal consent obtained. Risks and benefits: risks, benefits and alternatives were discussed Consent given by: patient Patient identity confirmed: verbally with patient Time out: Immediately prior to procedure a "time out" was called to verify the correct patient, procedure, equipment, support staff and site/side marked as required. Treatment site: right anterior and right Kiesselbach's area Repair method: silver nitrate Post-procedure assessment: bleeding  stopped Treatment complexity: simple Patient tolerance: Patient tolerated the procedure well with no immediate complications.    Results for orders placed during the hospital encounter of 06/26/12  PROTIME-INR      Component Value Range   Prothrombin Time 16.1 (*) 11.6 - 15.2 seconds   INR 1.26  0.00 - 1.49  CBC WITH DIFFERENTIAL      Component Value Range   WBC 12.8 (*) 4.0 - 10.5 K/uL   RBC 5.03  4.22 - 5.81 MIL/uL   Hemoglobin 14.3  13.0 - 17.0 g/dL   HCT 21.3  08.6 - 57.8 %   MCV 85.9  78.0 - 100.0 fL   MCH 28.4  26.0 - 34.0 pg   MCHC 33.1  30.0 - 36.0 g/dL   RDW 46.9  62.9 - 52.8 %   Platelets 395  150 - 400 K/uL   Neutrophils Relative 76  43 - 77 %   Neutro Abs 9.7 (*) 1.7 - 7.7 K/uL   Lymphocytes Relative 15  12 - 46 %   Lymphs Abs 1.9  0.7 - 4.0 K/uL   Monocytes Relative 7  3 - 12 %   Monocytes Absolute 0.9  0.1 - 1.0 K/uL   Eosinophils Relative 2  0 - 5 %   Eosinophils Absolute 0.3  0.0 - 0.7 K/uL   Basophils Relative 0  0 - 1 %   Basophils Absolute 0.0  0.0 - 0.1 K/uL      1. Right-sided epistaxis       MDM  3:30 AM patient seen and evaluated. Patient with active bleeding from inferior right nostril at Hesselbach's plexus. This was cauterized with silver nitrate. Labs pending to check patient's INR.   Labs unremarkable. Patient continues to be asymptomatic with bleeding controlled. Will discharge at this time.     Angus Seller, [place] 06/26/12 [phone]

## 2012-06-27 NOTE — ED Provider Notes (Signed)
Medical screening examination/treatment/procedure(s) were conducted as a shared visit with non-physician practitioner(s) and myself.  I personally evaluated the patient during the encounter  Please see my separate respective documentation pertaining to this patient encounter   Vida Roller, MD 06/27/12 [phone]

## 2012-06-28 ENCOUNTER — Encounter (HOSPITAL_COMMUNITY): Payer: Self-pay | Admitting: *Deleted

## 2012-06-28 ENCOUNTER — Emergency Department (HOSPITAL_COMMUNITY)
Admission: EM | Admit: 2012-06-28 | Discharge: 2012-06-28 | Disposition: A | Discharge status: Home / Self Care | Payer: Self-pay | Attending: Emergency Medicine | Admitting: Emergency Medicine

## 2012-06-28 DIAGNOSIS — Z9861 Coronary angioplasty status: Secondary | ICD-10-CM | POA: Insufficient documentation

## 2012-06-28 DIAGNOSIS — I2510 Atherosclerotic heart disease of native coronary artery without angina pectoris: Secondary | ICD-10-CM | POA: Insufficient documentation

## 2012-06-28 DIAGNOSIS — I252 Old myocardial infarction: Secondary | ICD-10-CM | POA: Insufficient documentation

## 2012-06-28 DIAGNOSIS — R040 Epistaxis: Secondary | ICD-10-CM | POA: Insufficient documentation

## 2012-06-28 DIAGNOSIS — Z7901 Long term (current) use of anticoagulants: Secondary | ICD-10-CM | POA: Insufficient documentation

## 2012-06-28 DIAGNOSIS — I4891 Unspecified atrial fibrillation: Secondary | ICD-10-CM | POA: Insufficient documentation

## 2012-06-28 DIAGNOSIS — Z7982 Long term (current) use of aspirin: Secondary | ICD-10-CM | POA: Insufficient documentation

## 2012-06-28 DIAGNOSIS — Z87891 Personal history of nicotine dependence: Secondary | ICD-10-CM | POA: Insufficient documentation

## 2012-06-28 MED ORDER — OXYMETAZOLINE HCL 0.05 % NA SOLN
NASAL | Status: AC
  Administered 2012-06-28: 1 via NASAL
  Filled 2012-06-28: qty 15

## 2012-06-28 MED ORDER — LIDOCAINE-EPINEPHRINE (PF) 1 %-1:200000 IJ SOLN
INTRAMUSCULAR | Status: AC
  Administered 2012-06-28: 1 mL
  Filled 2012-06-28: qty 10

## 2012-06-28 NOTE — ED Notes (Signed)
Nasal packing placed on Thursday to right nare. Here today to have packing removed.

## 2012-06-28 NOTE — ED Provider Notes (Signed)
History   This chart was scribed for Jeffrey Gaskins, MD by [name]. The patient was seen in room APA12/APA12. Patient's care was started at 0736.     CSN: [id number]  Arrival date & time 06/28/12  4098   First MD Initiated Contact with Patient 06/28/12 [phone]      Chief Complaint - epistaxis   The history is provided by the patient. No language interpreter was used.   Jeffrey Larson is a 56 y.o. male who presents to the Emergency Department requesting packing removal from his nose. Patient states that there has been some minimal leakage, but no pain. Patient was seen on 06/26/12 complaining of constant right-sided epistaxis. Patient's right anterior septal nosebleed was cauterized. Patient was monitored in the ED and discharged home with instructions to have packing removed in 48 hours. Patient states that he had an MI 3 weeks ago. He had coronary stent placement (x3) following the episode. Patient was not placed on antibiotitics. Patient began taking Coumadin after the MI. His levels were last measured 10 days ago at [facility]. His doctor has since instructed him to stop taking Coumadin.    Past Medical History  Diagnosis Date  . Coronary artery disease   . Myocardial infarction   . Afib     Past Surgical History  Procedure Date  . No past surgeries   . Coronary stent placement 7/12013    No family history on file.  History  Substance Use Topics  . Smoking status: Former Smoker -- 0 years    Types: Cigarettes    Quit date: 11/26/1996  . Smokeless tobacco: Not on file  . Alcohol Use: No      Review of Systems  Constitutional: Negative for fever.  Gastrointestinal: Negative for vomiting.    Allergies  Review of patient's allergies indicates no known allergies.  Home Medications   Current Outpatient Rx  Name Route Sig Dispense Refill  . ALBUTEROL SULFATE HFA 108 (90 BASE) MCG/ACT IN AERS Inhalation Inhale 2 puffs into the lungs 2 (two) times daily.    .  AMIODARONE HCL 200 MG PO TABS Oral Take 1 tablet (200 mg total) by mouth 2 (two) times daily. 60 tablet 3  . ASPIRIN 81 MG PO TBEC Oral Take 1 tablet (81 mg total) by mouth daily. 30 tablet 3  . ATORVASTATIN CALCIUM 80 MG PO TABS Oral Take 1 tablet (80 mg total) by mouth daily at 6 PM. 30 tablet 3  . CO Q 10 10 MG PO CAPS Oral Take 1 capsule by mouth daily.    Marland Kitchen METOPROLOL TARTRATE 25 MG PO TABS Oral Take 25 mg by mouth 2 (two) times daily.    Marland Kitchen NITROGLYCERIN 0.4 MG SL SUBL Sublingual Place 1 tablet (0.4 mg total) under the tongue every 5 (five) minutes x 3 doses as needed for chest pain. 25 tablet 3  . FISH OIL 1200 MG PO CAPS Oral Take 1 capsule by mouth 3 (three) times daily.    Marland Kitchen RAMIPRIL 2.5 MG PO CAPS Oral Take 2.5 mg by mouth every morning.    Marland Kitchen TICAGRELOR 90 MG PO TABS Oral Take 1 tablet (90 mg total) by mouth 2 (two) times daily. 60 tablet 11  . WARFARIN SODIUM 5 MG PO TABS Oral Take 5 mg by mouth daily. At 6pm      BP 102/78  Pulse 90  Resp 16  SpO2 98%  Physical Exam CONSTITUTIONAL: Well developed/well nourished HEAD AND FACE: Normocephalic/atraumatic EYES: EOMI/PERRL  ENMT: Mucous membranes moist. Nasal packing in right nare. No blood noted in oropharynx. NECK: supple no meningeal signs SPINE:entire spine nontender CV: S1/S2 noted, no murmurs/rubs/gallops noted LUNGS: Lungs are clear to auscultation bilaterally, no apparent distress ABDOMEN: soft, nontender, no rebound or guarding NEURO: Pt is awake/alert, moves all extremitiesx4 EXTREMITIES: pulses normal, full ROM SKIN: warm, color normal PSYCH: no abnormalities of mood noted  ED Course  EPISTAXIS MANAGEMENT Performed by: Jeffrey Larson Authorized by: Jeffrey Larson Consent: Verbal consent obtained. Anesthesia: local infiltration Local anesthetic: lidocaine 1% with epinephrine Patient sedated: no Treatment site: right anterior Repair method: silver nitrate Post-procedure assessment: bleeding  stopped Treatment complexity: simple Patient tolerance: Patient tolerated the procedure well with no immediate complications. Comments: No septal necrosis.  No septal deviation.  Pt denies pain.      DIAGNOSTIC STUDIES: Oxygen Saturation is 98% on room air, normal by my interpretation.    COORDINATION OF CARE: 7:41am- Patient informed of current plan for treatment and evaluation and agrees with plan at this time. Removed nasal packing from right nare. Patient instructed to apply pressure to area for several minutes. No complications from removal, entire nasal balloon removed, no septal necrosis/deviation noted Small amt of blood noted at septum after removal.  I cautererized the area and patient tolerated well.  He will see ENT next week.  Discussed strict return precautions. Pt has no pain and denies bleeding in oropharynx     MDM  Nursing notes including past medical history and social history reviewed and considered in documentation Previous records reviewed and considered       I personally performed the services described in this documentation, which was scribed in my presence. The recorded information has been reviewed and considered.       Jeffrey Gaskins, MD 06/28/12 [phone]

## 2012-06-28 NOTE — ED Notes (Signed)
Lidocaine to right nare with one spray afrin and silver nitrate used to stop bleeding.

## 2013-11-26 DIAGNOSIS — I219 Acute myocardial infarction, unspecified: Secondary | ICD-10-CM

## 2013-11-26 HISTORY — DX: Acute myocardial infarction, unspecified: I21.9

## 2014-03-26 HISTORY — PX: CARDIOVASCULAR STRESS TEST: SHX262

## 2014-04-13 ENCOUNTER — Other Ambulatory Visit (HOSPITAL_COMMUNITY): Payer: Self-pay | Admitting: Cardiology

## 2014-04-13 DIAGNOSIS — R079 Chest pain, unspecified: Secondary | ICD-10-CM

## 2014-04-28 ENCOUNTER — Encounter (HOSPITAL_COMMUNITY): Payer: BC Managed Care – PPO

## 2014-04-28 ENCOUNTER — Encounter (HOSPITAL_COMMUNITY)
Admission: RE | Admit: 2014-04-28 | Discharge: 2014-04-28 | Disposition: A | Discharge status: Home / Self Care | Payer: BC Managed Care – PPO | Source: Ambulatory Visit | Attending: Cardiology | Admitting: Cardiology

## 2014-04-28 DIAGNOSIS — R079 Chest pain, unspecified: Secondary | ICD-10-CM

## 2014-04-28 DIAGNOSIS — I259 Chronic ischemic heart disease, unspecified: Secondary | ICD-10-CM | POA: Insufficient documentation

## 2014-04-28 DIAGNOSIS — I4891 Unspecified atrial fibrillation: Secondary | ICD-10-CM | POA: Insufficient documentation

## 2014-04-28 DIAGNOSIS — F17200 Nicotine dependence, unspecified, uncomplicated: Secondary | ICD-10-CM | POA: Diagnosis not present

## 2014-04-28 DIAGNOSIS — Z8249 Family history of ischemic heart disease and other diseases of the circulatory system: Secondary | ICD-10-CM | POA: Insufficient documentation

## 2014-04-28 MED ORDER — REGADENOSON 0.4 MG/5ML IV SOLN
0.4000 mg | Freq: Once | INTRAVENOUS | Status: AC
  Administered 2014-04-28: 0.4 mg via INTRAVENOUS

## 2014-04-28 MED ORDER — REGADENOSON 0.4 MG/5ML IV SOLN
INTRAVENOUS | Status: AC
  Filled 2014-04-28: qty 5

## 2014-04-29 ENCOUNTER — Encounter (HOSPITAL_COMMUNITY): Payer: BC Managed Care – PPO

## 2014-04-29 ENCOUNTER — Encounter (HOSPITAL_COMMUNITY)
Admission: RE | Admit: 2014-04-29 | Discharge: 2014-04-29 | Disposition: A | Discharge status: Home / Self Care | Payer: BC Managed Care – PPO | Source: Ambulatory Visit | Attending: Cardiology | Admitting: Cardiology

## 2014-04-29 DIAGNOSIS — I259 Chronic ischemic heart disease, unspecified: Secondary | ICD-10-CM | POA: Diagnosis not present

## 2014-04-29 MED ORDER — TECHNETIUM TC 99M SESTAMIBI GENERIC - CARDIOLITE
30.0000 | Freq: Once | INTRAVENOUS | Status: AC | PRN
  Administered 2014-04-29: 30 via INTRAVENOUS

## 2014-07-20 ENCOUNTER — Encounter: Payer: Self-pay | Admitting: Emergency Medicine

## 2014-07-20 ENCOUNTER — Emergency Department (INDEPENDENT_AMBULATORY_CARE_PROVIDER_SITE_OTHER)
Admission: EM | Admit: 2014-07-20 | Discharge: 2014-07-20 | Disposition: A | Discharge status: Home / Self Care | Payer: BC Managed Care – PPO | Source: Home / Self Care | Attending: Family Medicine | Admitting: Family Medicine

## 2014-07-20 ENCOUNTER — Emergency Department (INDEPENDENT_AMBULATORY_CARE_PROVIDER_SITE_OTHER): Payer: BC Managed Care – PPO

## 2014-07-20 DIAGNOSIS — J209 Acute bronchitis, unspecified: Secondary | ICD-10-CM

## 2014-07-20 DIAGNOSIS — R0602 Shortness of breath: Secondary | ICD-10-CM

## 2014-07-20 DIAGNOSIS — R059 Cough, unspecified: Secondary | ICD-10-CM

## 2014-07-20 DIAGNOSIS — J189 Pneumonia, unspecified organism: Secondary | ICD-10-CM

## 2014-07-20 DIAGNOSIS — R05 Cough: Secondary | ICD-10-CM

## 2014-07-20 HISTORY — DX: Hyperlipidemia, unspecified: E78.5

## 2014-07-20 HISTORY — DX: Personal history of pneumonia (recurrent): Z87.01

## 2014-07-20 MED ORDER — ALBUTEROL SULFATE (5 MG/ML) 0.5% IN NEBU: 2.5000 mg | INHALATION_SOLUTION | Freq: Four times a day (QID) | RESPIRATORY_TRACT | Status: DC | PRN

## 2014-07-20 MED ORDER — PREDNISONE 50 MG PO TABS
50.0000 mg | ORAL_TABLET | Freq: Every day | ORAL | Status: DC
Start: 2014-07-20 — End: 2014-08-04

## 2014-07-20 MED ORDER — IPRATROPIUM-ALBUTEROL 0.5-2.5 (3) MG/3ML IN SOLN
3.0000 mL | Freq: Once | RESPIRATORY_TRACT | Status: AC
  Administered 2014-07-20: 3 mL via RESPIRATORY_TRACT

## 2014-07-20 MED ORDER — LEVOFLOXACIN 500 MG PO TABS: 500.0000 mg | ORAL_TABLET | Freq: Every day | ORAL | Status: DC

## 2014-07-20 NOTE — ED Provider Notes (Signed)
Jeffrey Larson is a 58 y.o. male who presents to Urgent Care today for hoarse voice, cough fever and shortness of breath. This is associated with chest tightness and wheezing. He's had a hoarse voice now for a few months. His symptoms worsened only a few days ago. He's had a few episodes of vomiting as well. No abdominal pain chest pain or diarrhea. He feels well otherwise.   Past Medical History  Diagnosis Date  . Coronary artery disease   . Myocardial infarction   . Afib   . History of pneumonia   . Hyperlipidemia    History  Substance Use Topics  . Smoking status: Former Smoker -- 0 years    Types: Cigarettes    Quit date: 11/26/1996  . Smokeless tobacco: Not on file  . Alcohol Use: No   ROS as above Medications: No current facility-administered medications for this encounter.   Current Outpatient Prescriptions  Medication Sig Dispense Refill  . albuterol (PROVENTIL HFA;VENTOLIN HFA) 108 (90 BASE) MCG/ACT inhaler Inhale 2 puffs into the lungs 2 (two) times daily.      Marland Kitchen aspirin 81 MG tablet Take 81 mg by mouth daily.      Marland Kitchen atorvastatin (LIPITOR) 80 MG tablet Take 1 tablet (80 mg total) by mouth daily at 6 PM.  30 tablet  3  . carvedilol (COREG) 12.5 MG tablet Take 12.5 mg by mouth 2 (two) times daily with a meal.      . losartan (COZAAR) 50 MG tablet Take 50 mg by mouth daily.      . nitroGLYCERIN (NITROSTAT) 0.4 MG SL tablet Place 1 tablet (0.4 mg total) under the tongue every 5 (five) minutes x 3 doses as needed for chest pain.  25 tablet  3  . warfarin (COUMADIN) 5 MG tablet Take 5 mg by mouth daily. At 6pm      . albuterol (PROVENTIL) (5 MG/ML) 0.5% nebulizer solution Take 0.5 mLs (2.5 mg total) by nebulization every 6 (six) hours as needed for wheezing or shortness of breath.  20 mL  1  . Coenzyme Q10 (CO Q 10) 10 MG CAPS Take 1 capsule by mouth daily.      Marland Kitchen levofloxacin (LEVAQUIN) 500 MG tablet Take 1 tablet (500 mg total) by mouth daily.  7 tablet  0  . Omega-3 Fatty  Acids (FISH OIL) 1200 MG CAPS Take 1 capsule by mouth 3 (three) times daily.      . predniSONE (DELTASONE) 50 MG tablet Take 1 tablet (50 mg total) by mouth daily.  5 tablet  0  . [DISCONTINUED] amiodarone (PACERONE) 200 MG tablet Take 1 tablet (200 mg total) by mouth 2 (two) times daily.  60 tablet  3  . [DISCONTINUED] ramipril (ALTACE) 2.5 MG capsule Take 2.5 mg by mouth daily.         Exam:  BP 122/84  Pulse 68  Temp(Src) 99.5 F (37.5 C) (Oral)  Resp 24  Ht 5\' 11"  (1.803 m)  Wt 291 lb (131.997 kg)  BMI 40.60 kg/m2  SpO2 95% Gen: Well NAD HEENT: EOMI,  MMM Lungs: Increased work of breathing with prolonged expiratory phase and wheezing bilaterally Heart: RRR no MRG Abd: NABS, Soft. Nondistended, Nontender Exts: Brisk capillary refill, warm and well perfused.   Patient was given a DuoNeb treatment and felt better.  No results found for this or any previous visit (from the past 24 hour(s)). Dg Chest 2 View  07/20/2014   CLINICAL DATA:  Shortness of breath  and cough three days.  EXAM: CHEST  2 VIEW  COMPARISON:  12/03/2011  FINDINGS: Lungs are adequately inflated demonstrate heterogeneous opacification over the region of the right middle lobe likely infection. No evidence of effusion. Mild stable cardiomegaly. There are degenerative changes of the spine.  IMPRESSION: Airspace process over the right middle lobe likely infection.  Stable cardiomegaly.   Electronically Signed   By: Marin Olp M.D.   On: 07/20/2014 19:40    Assessment and Plan: 58 y.o. male with community-acquired pneumonia in the setting of bronchitis or COPD exacerbation. Plan to treat with Levaquin and prednisone as well as albuterol. Recommend recheck INR in a few days. Followup with primary care provider.  Discussed warning signs or symptoms. Please see discharge instructions. Patient expresses understanding.   This note was created using Systems analyst. Any transcription errors are  unintended.    Gregor Hams, MD 07/20/14 [phone]

## 2014-07-20 NOTE — ED Notes (Signed)
Pt c/o cough and SOB x 1 mth, worse x 1 wk. He also c/o fever and chills x 3 days.

## 2014-07-20 NOTE — Discharge Instructions (Signed)
Thank you for coming in today. Take prednisone and Levaquin daily Use albuterol as needed Get your warfarin level checked in a few days Call or go to the emergency room if you get worse, have trouble breathing, have chest pains, or palpitations.   Pneumonia Pneumonia is an infection of the lungs.  CAUSES Pneumonia may be caused by bacteria or a virus. Usually, these infections are caused by breathing infectious particles into the lungs (respiratory tract). SIGNS AND SYMPTOMS   Cough.  Fever.  Chest pain.  Increased rate of breathing.  Wheezing.  Mucus production. DIAGNOSIS  If you have the common symptoms of pneumonia, your health care provider will typically confirm the diagnosis with a chest X-ray. The X-ray will show an abnormality in the lung (pulmonary infiltrate) if you have pneumonia. Other tests of your blood, urine, or sputum may be done to find the specific cause of your pneumonia. Your health care provider may also do tests (blood gases or pulse oximetry) to see how well your lungs are working. TREATMENT  Some forms of pneumonia may be spread to other people when you cough or sneeze. You may be asked to wear a mask before and during your exam. Pneumonia that is caused by bacteria is treated with antibiotic medicine. Pneumonia that is caused by the influenza virus may be treated with an antiviral medicine. Most other viral infections must run their course. These infections will not respond to antibiotics.  HOME CARE INSTRUCTIONS   Cough suppressants may be used if you are losing too much rest. However, coughing protects you by clearing your lungs. You should avoid using cough suppressants if you can.  Your health care provider may have prescribed medicine if he or she thinks your pneumonia is caused by bacteria or influenza. Finish your medicine even if you start to feel better.  Your health care provider may also prescribe an expectorant. This loosens the mucus to be  coughed up.  Take medicines only as directed by your health care provider.  Do not smoke. Smoking is a common cause of bronchitis and can contribute to pneumonia. If you are a smoker and continue to smoke, your cough may last several weeks after your pneumonia has cleared.  A cold steam vaporizer or humidifier in your room or home may help loosen mucus.  Coughing is often worse at night. Sleeping in a semi-upright position in a recliner or using a couple pillows under your head will help with this.  Get rest as you feel it is needed. Your body will usually let you know when you need to rest. PREVENTION A pneumococcal shot (vaccine) is available to prevent a common bacterial cause of pneumonia. This is usually suggested for:  People over 93 years old.  Patients on chemotherapy.  People with chronic lung problems, such as bronchitis or emphysema.  People with immune system problems. If you are over 65 or have a high risk condition, you may receive the pneumococcal vaccine if you have not received it before. In some countries, a routine influenza vaccine is also recommended. This vaccine can help prevent some cases of pneumonia.You may be offered the influenza vaccine as part of your care. If you smoke, it is time to quit. You may receive instructions on how to stop smoking. Your health care provider can provide medicines and counseling to help you quit. SEEK MEDICAL CARE IF: You have a fever. SEEK IMMEDIATE MEDICAL CARE IF:   Your illness becomes worse. This is especially true if  you are elderly or weakened from any other disease.  You cannot control your cough with suppressants and are losing sleep.  You begin coughing up blood.  You develop pain which is getting worse or is uncontrolled with medicines.  Any of the symptoms which initially brought you in for treatment are getting worse rather than better.  You develop shortness of breath or chest pain. MAKE SURE YOU:    Understand these instructions.  Will watch your condition.  Will get help right away if you are not doing well or get worse. Document Released: 11/12/2005 Document Revised: 03/29/2014 Document Reviewed: 02/01/2011 [facility] Patient Information 2015 [place], [place]. This information is not intended to replace advice given to you by your health care provider. Make sure you discuss any questions you have with your health care provider.    Acute Bronchitis Bronchitis is inflammation of the airways that extend from the windpipe into the lungs (bronchi). The inflammation often causes mucus to develop. This leads to a cough, which is the most common symptom of bronchitis.  In acute bronchitis, the condition usually develops suddenly and goes away over time, usually in a couple weeks. Smoking, allergies, and asthma can make bronchitis worse. Repeated episodes of bronchitis may cause further lung problems.  CAUSES Acute bronchitis is most often caused by the same virus that causes a cold. The virus can spread from person to person (contagious) through coughing, sneezing, and touching contaminated objects. SIGNS AND SYMPTOMS   Cough.   Fever.   Coughing up mucus.   Body aches.   Chest congestion.   Chills.   Shortness of breath.   Sore throat.  DIAGNOSIS  Acute bronchitis is usually diagnosed through a physical exam. Your health care provider will also ask you questions about your medical history. Tests, such as chest X-rays, are sometimes done to rule out other conditions.  TREATMENT  Acute bronchitis usually goes away in a couple weeks. Oftentimes, no medical treatment is necessary. Medicines are sometimes given for relief of fever or cough. Antibiotic medicines are usually not needed but may be prescribed in certain situations. In some cases, an inhaler may be recommended to help reduce shortness of breath and control the cough. A cool mist vaporizer may also be used to help  thin bronchial secretions and make it easier to clear the chest.  HOME CARE INSTRUCTIONS  Get plenty of rest.   Drink enough fluids to keep your urine clear or pale yellow (unless you have a medical condition that requires fluid restriction). Increasing fluids may help thin your respiratory secretions (sputum) and reduce chest congestion, and it will prevent dehydration.   Take medicines only as directed by your health care provider.  If you were prescribed an antibiotic medicine, finish it all even if you start to feel better.  Avoid smoking and secondhand smoke. Exposure to cigarette smoke or irritating chemicals will make bronchitis worse. If you are a smoker, consider using nicotine gum or skin patches to help control withdrawal symptoms. Quitting smoking will help your lungs heal faster.   Reduce the chances of another bout of acute bronchitis by washing your hands frequently, avoiding people with cold symptoms, and trying not to touch your hands to your mouth, nose, or eyes.   Keep all follow-up visits as directed by your health care provider.  SEEK MEDICAL CARE IF: Your symptoms do not improve after 1 week of treatment.  SEEK IMMEDIATE MEDICAL CARE IF:  You develop an increased fever or chills.  You have chest pain.   You have severe shortness of breath.  You have bloody sputum.   You develop dehydration.  You faint or repeatedly feel like you are going to pass out.  You develop repeated vomiting.  You develop a severe headache. MAKE SURE YOU:   Understand these instructions.  Will watch your condition.  Will get help right away if you are not doing well or get worse. Document Released: 12/20/2004 Document Revised: 03/29/2014 Document Reviewed: 05/05/2013 [facility] Patient Information 2015 [place], [place]. This information is not intended to replace advice given to you by your health care provider. Make sure you discuss any questions you have with your health  care provider.

## 2014-07-21 ENCOUNTER — Encounter: Payer: Self-pay | Admitting: Family Medicine

## 2014-07-22 ENCOUNTER — Telehealth: Payer: Self-pay | Admitting: Emergency Medicine

## 2014-07-29 ENCOUNTER — Telehealth: Payer: Self-pay | Admitting: Emergency Medicine

## 2014-08-04 ENCOUNTER — Encounter: Payer: Self-pay | Admitting: Family Medicine

## 2014-08-04 ENCOUNTER — Ambulatory Visit (INDEPENDENT_AMBULATORY_CARE_PROVIDER_SITE_OTHER): Payer: BC Managed Care – PPO | Admitting: Family Medicine

## 2014-08-04 VITALS — BP 90/61 | HR 63 | Ht 71.0 in | Wt 290.0 lb

## 2014-08-04 DIAGNOSIS — I4891 Unspecified atrial fibrillation: Secondary | ICD-10-CM

## 2014-08-04 DIAGNOSIS — I252 Old myocardial infarction: Secondary | ICD-10-CM | POA: Diagnosis not present

## 2014-08-04 DIAGNOSIS — J189 Pneumonia, unspecified organism: Secondary | ICD-10-CM

## 2014-08-04 DIAGNOSIS — I10 Essential (primary) hypertension: Secondary | ICD-10-CM | POA: Diagnosis not present

## 2014-08-04 DIAGNOSIS — E785 Hyperlipidemia, unspecified: Secondary | ICD-10-CM | POA: Insufficient documentation

## 2014-08-04 DIAGNOSIS — I482 Chronic atrial fibrillation, unspecified: Secondary | ICD-10-CM

## 2014-08-04 MED ORDER — AZITHROMYCIN 250 MG PO TABS: ORAL_TABLET | ORAL | Status: AC

## 2014-08-04 MED ORDER — CEFDINIR 300 MG PO CAPS: 300.0000 mg | ORAL_CAPSULE | Freq: Two times a day (BID) | ORAL | Status: DC

## 2014-08-04 MED ORDER — ALBUTEROL SULFATE HFA 108 (90 BASE) MCG/ACT IN AERS: INHALATION_SPRAY | RESPIRATORY_TRACT | Status: DC

## 2014-08-04 MED ORDER — PREDNISONE 20 MG PO TABS
ORAL_TABLET | ORAL | Status: DC
Start: 2014-08-04 — End: 2014-08-12

## 2014-08-04 NOTE — Progress Notes (Signed)
CC: Jeffrey Larson is a 58 y.o. male is here for Establish Care   Subjective: HPI:  Very pleasant 58 year old here to establish care  2 weeks patient was diagnosed with community acquired pneumonia in the right middle lobe at our urgent care Center. This is in the setting of shortness of breath with walking Anything longer than the length of a hallway, productive cough, wheezing, and fatigue. After taking the prednisone burst and one week of levofloxacin he says that he was feeling approximately 90% better but over this last week he's been slowly having a return of his initial symptoms but no shortness of breath as of yet.  He's been using albuterol which significantly helped symptoms but only for a few hours.  He denies orthopnea, peripheral edema, PND, nor blood in his sputum  History of essential hypertension: Patient states that due to a chronic cough a a medication was changed he saw his cardiologist about 2 months ago. He reports his blood pressure has been lower than normal since that time there've been no other medication changes other than his antibiotic and prednisone above. Denies exertional chest pain or limb claudication. Does not have any objective readings for blood pressures at home  Review of Systems - General ROS: negative for - chills, fever, night sweats, weight gain or weight loss Ophthalmic ROS: negative for - decreased vision Psychological ROS: negative for - anxiety or depression ENT ROS: negative for - hearing change, nasal congestion, tinnitus or allergies Hematological and Lymphatic ROS: negative for - bleeding problems, bruising or swollen lymph nodes Breast ROS: negative Respiratory ROS: no  shortness of breath Cardiovascular ROS: no chest pain or dyspnea on exertion Gastrointestinal ROS: no abdominal pain, change in bowel habits, or black or bloody stools Genito-Urinary ROS: negative for - genital discharge, genital ulcers, incontinence or abnormal bleeding from  genitals Musculoskeletal ROS: negative for - joint pain or muscle pain Neurological ROS: negative for - headaches or memory loss Dermatological ROS: negative for lumps, mole changes, rash and skin lesion changes  Past Medical History  Diagnosis Date  . Coronary artery disease   . Myocardial infarction   . Afib   . History of pneumonia     RML on CXR 07/20/14  . Hyperlipidemia     Past Surgical History  Procedure Laterality Date  . Coronary stent placement  7/12013  . Appendectomy    . Cardiovascular stress test  03/2014    Borderline reversible ischemic changes at the apex.  Normal LV contractility/EF 52%.   Family History  Problem Relation Age of Onset  . Heart disease Mother   . Heart disease Father     History   Social History  . Marital Status: Married    Spouse Name: N/A    Number of Children: N/A  . Years of Education: N/A   Occupational History  . Not on file.   Social History Main Topics  . Smoking status: Former Smoker -- 0 years    Types: Cigarettes    Quit date: 11/26/1996  . Smokeless tobacco: Not on file  . Alcohol Use: No  . Drug Use: No  . Sexual Activity: Yes   Other Topics Concern  . Not on file   Social History Narrative  . No narrative on file     Objective: BP 90/61  Pulse 63  Ht 5\' 11"  (1.803 m)  Wt 290 lb (131.543 kg)  BMI 40.46 kg/m2  General: Alert and Oriented, No Acute Distress HEENT: Pupils equal,  round, reactive to light. Conjunctivae clear.  External ears unremarkable, canals clear with intact TMs with appropriate landmarks.  Middle ear appears open without effusion. Pink inferior turbinates.  Moist mucous membranes, pharynx without inflammation nor lesions.  Neck supple without palpable lymphadenopathy nor abnormal masses. Lungs:  moderate wheezing and rhonchi in the right middle posterior lung field but clear in all other lesions without rails  Cardiac:  irregularly irregular rhythm under 100 beats per minute  Extremities:  No peripheral edema.  Strong peripheral pulses.  Mental Status: No depression, anxiety, nor agitation. Skin: Warm and dry.  Assessment & Plan: Jeffrey Larson was seen today for establish care.  Diagnoses and associated orders for this visit:  CAP (community acquired pneumonia) - predniSONE (DELTASONE) 20 MG tablet; Three tabs daily days 1-3, two tabs daily days 4-6, one tab daily days 7-9, half tab daily days 10-13. - cefdinir (OMNICEF) 300 MG capsule; Take 1 capsule (300 mg total) by mouth 2 (two) times daily. - azithromycin (ZITHROMAX) 250 MG tablet; Take two tabs at once on day 1, then one tab daily on days 2-5. - albuterol (PROVENTIL HFA;VENTOLIN HFA) 108 (90 BASE) MCG/ACT inhaler; Inhale two puffs every 4-6 hours only as needed for shortness of breath or wheezing.  History of MI (myocardial infarction)  Chronic atrial fibrillation  Hyperlipidemia  Essential hypertension, benign    Community acquired pneumonia: Based on lung exam and symptoms it does not appear that this has fully resolved. I like him to restart prednisone using a taper, start Omnicef and azithromycin.Signs and symptoms requring emergent/urgent reevaluation were discussed with the patient. Essential hypertension: Controlled but I believe his regimen is causing unintentional hypertension. I've encouraged him to cut his carvedilol and has to take only 6.25 mg twice a day until he can followup with his cardiologist for further management Atrial fibrillation: Rate controlled today in stable not likely contributing to his cough  Return in about 1 week (around 08/11/2014).

## 2014-08-12 ENCOUNTER — Other Ambulatory Visit: Payer: Self-pay | Admitting: Family Medicine

## 2014-08-12 DIAGNOSIS — I482 Chronic atrial fibrillation, unspecified: Secondary | ICD-10-CM

## 2014-08-12 DIAGNOSIS — I10 Essential (primary) hypertension: Secondary | ICD-10-CM

## 2014-08-12 DIAGNOSIS — I050 Rheumatic mitral stenosis: Secondary | ICD-10-CM

## 2014-08-12 DIAGNOSIS — I052 Rheumatic mitral stenosis with insufficiency: Secondary | ICD-10-CM | POA: Insufficient documentation

## 2014-09-08 ENCOUNTER — Telehealth: Payer: Self-pay | Admitting: *Deleted

## 2014-09-08 ENCOUNTER — Ambulatory Visit (INDEPENDENT_AMBULATORY_CARE_PROVIDER_SITE_OTHER): Payer: BC Managed Care – PPO | Admitting: Family Medicine

## 2014-09-08 ENCOUNTER — Encounter: Payer: Self-pay | Admitting: Family Medicine

## 2014-09-08 VITALS — BP 110/70 | HR 73 | Ht 71.0 in | Wt 300.0 lb

## 2014-09-08 DIAGNOSIS — J4489 Other specified chronic obstructive pulmonary disease: Secondary | ICD-10-CM

## 2014-09-08 DIAGNOSIS — J449 Chronic obstructive pulmonary disease, unspecified: Secondary | ICD-10-CM | POA: Diagnosis not present

## 2014-09-08 HISTORY — DX: Other specified chronic obstructive pulmonary disease: J44.89

## 2014-09-08 HISTORY — DX: Chronic obstructive pulmonary disease, unspecified: J44.9

## 2014-09-08 MED ORDER — UMECLIDINIUM-VILANTEROL 62.5-25 MCG/INH IN AEPB: 1.0000 [IU] | INHALATION_SPRAY | Freq: Every day | RESPIRATORY_TRACT | Status: DC

## 2014-09-08 NOTE — Progress Notes (Signed)
CC: Jeffrey Larson is a 58 y.o. male is here for Cough   Subjective: HPI:  Complains of cough is described as productive and has been present for the majority of the summer. In late August he was accompanied by moderate to severe shortness of breath with exertion and at rest however this resolved after I saw him last and he finished a prednisone taper and Omnicef/azithromycin for community-acquired pneumonia.  He is confident that this cough is present well before his diagnosis of community acquired pneumonia. He states he has no shortness of breath fever, chills, chest pain but has a moderately productive cough is present all hours of the day worse first thing in the morning. Improved and absent for one to 2 hours after using albuterol. 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    Denies chills, unintentional weight loss, orthopnea, peripheral edema, racing heartbeat, nor PND. Nothing particularly makes the symptoms better or worse other than that described above   Review Of Systems Outlined In HPI  Past Medical History  Diagnosis Date  . Coronary artery disease   . Myocardial infarction   . Afib   . History of pneumonia     RML on CXR 07/20/14  . Hyperlipidemia     Past Surgical History  Procedure Laterality Date  . Coronary stent placement  7/12013  . Appendectomy    . Cardiovascular stress test  03/2014    Borderline reversible ischemic changes at the apex.  Normal LV contractility/EF 52%.   Family History  Problem Relation Age of Onset  . Heart disease Mother   . Heart disease Father     History   Social History  . Marital Status: Married    Spouse Name: N/A    Number of Children: N/A  . Years of Education: N/A   Occupational History  . Not on file.   Social History Main Topics  . Smoking status: Former Smoker -- 0 years    Types: Cigarettes    Quit date:  11/26/1996  . Smokeless tobacco: Not on file  . Alcohol Use: No  . Drug Use: No  . Sexual Activity: Yes   Other Topics Concern  . Not on file   Social History Narrative  . No narrative on file     Objective: BP 110/70  Pulse 73  Ht 5\' 11"  (1.803 m)  Wt 300 lb (136.079 kg)  BMI 41.86 kg/m2  SpO2 96%  General: Alert and Oriented, No Acute Distress HEENT: Pupils equal, round, reactive to light. Conjunctivae clear.  External ears unremarkable, canals clear with intact TMs with appropriate landmarks.  Middle ear appears open without effusion. Pink inferior turbinates.  Moist mucous membranes, pharynx without inflammation nor lesions.  Neck supple without palpable lymphadenopathy nor abnormal masses. Lungs: Trace end expiratory wheezing and mild rhonchi in all lung fields without rales or signs of consolidation Cardiac: Irregularly irregular heartbeat less than 100 beats per minute with normal S1 and normal S2 heart sounds without rubs or gallops. Extremities: No peripheral edema.  Strong peripheral pulses.  Mental Status: No depression, anxiety, nor agitation. Skin: Warm and dry.  Assessment & Plan: Jeffrey Larson was seen today for cough.  Diagnoses and associated orders for this visit:  COPD with chronic bronchitis - Umeclidinium-Vilanterol (ANORO ELLIPTA) 62.5-25 MCG/INH AEPB; Inhale 1 Units into the lungs daily. - Umeclidinium-Vilanterol (ANORO ELLIPTA) 62.5-25  MCG/INH AEPB; Inhale 1 Units into the lungs daily.    [name] suspect chronic bronchitis, uncontrolled continue as needed albuterol however begin Anoro.  He was provided with a seven-day sample and a one-year savings voucher.  Time was taken to instruct the patient on proper use of daily inhalation. Followup in one month if improving, sooner if no better by next week.   Return in about 4 weeks (around 10/06/2014) for cough.

## 2014-09-08 NOTE — Telephone Encounter (Signed)
error 

## 2014-09-08 NOTE — Addendum Note (Signed)
Addended by: Isaias Cowman C on: 09/08/2014 11:16 AM   Modules accepted: Orders

## 2014-10-11 ENCOUNTER — Ambulatory Visit (INDEPENDENT_AMBULATORY_CARE_PROVIDER_SITE_OTHER): Payer: BC Managed Care – PPO | Admitting: Family Medicine

## 2014-10-11 ENCOUNTER — Encounter: Payer: Self-pay | Admitting: Family Medicine

## 2014-10-11 VITALS — BP 110/74 | HR 74 | Wt 305.0 lb

## 2014-10-11 DIAGNOSIS — I10 Essential (primary) hypertension: Secondary | ICD-10-CM | POA: Diagnosis not present

## 2014-10-11 DIAGNOSIS — J449 Chronic obstructive pulmonary disease, unspecified: Secondary | ICD-10-CM | POA: Diagnosis not present

## 2014-10-11 DIAGNOSIS — R062 Wheezing: Secondary | ICD-10-CM | POA: Diagnosis not present

## 2014-10-11 MED ORDER — PREDNISONE 20 MG PO TABS: ORAL_TABLET | ORAL | Status: AC

## 2014-10-11 NOTE — Progress Notes (Signed)
CC: Jeffrey Larson is a 58 y.o. male is here for Follow-up   Subjective: HPI:  COPD: Started Anoro since I saw him last and states that his cough is drastically improved. He is not experiencing any shortness of breath. Over the past week he's experienced worsening of his cough with wheezing with nasal congestion. Symptoms are mild to moderate in severity. Nothing particularly makes it better or worse. Cough is described as productive without blood and sputum. Denies fevers, chills, confusion, chest pain, shortness of breath.  Complains of lightheadedness and nauseousness when bending over. Symptoms have been present for the past month. Present on a daily basis. Last a matter of seconds moderate in severity. Other than above nothing particularly makes better or worse. Denies any abdominal pain or dietary link to his complaints.   Review Of Systems Outlined In HPI  Past Medical History  Diagnosis Date  . Coronary artery disease   . Myocardial infarction   . Afib   . History of pneumonia     RML on CXR 07/20/14  . Hyperlipidemia     Past Surgical History  Procedure Laterality Date  . Coronary stent placement  7/12013  . Appendectomy    . Cardiovascular stress test  03/2014    Borderline reversible ischemic changes at the apex.  Normal LV contractility/EF 52%.   Family History  Problem Relation Age of Onset  . Heart disease Mother   . Heart disease Father     History   Social History  . Marital Status: Married    Spouse Name: N/A    Number of Children: N/A  . Years of Education: N/A   Occupational History  . Not on file.   Social History Main Topics  . Smoking status: Former Smoker -- 0 years    Types: Cigarettes    Quit date: 11/26/1996  . Smokeless tobacco: Not on file  . Alcohol Use: No  . Drug Use: No  . Sexual Activity: Yes   Other Topics Concern  . Not on file   Social History Narrative     Objective: BP 110/74 mmHg  Pulse 74  Wt 305 lb (138.347 kg)   SpO2 97%  General: Alert and Oriented, No Acute Distress HEENT: Pupils equal, round, reactive to light. Conjunctivae clear.  External ears unremarkable, canals clear with intact TMs with appropriate landmarks.  Middle ear appears open without effusion. Pink inferior turbinates.  Moist mucous membranes, pharynx without inflammation nor lesions.  Neck supple without palpable lymphadenopathy nor abnormal masses. Lungs: comfortable work of breathing with mild end expiratory wheezing in all lung fields. No Rales or rhonchi Cardiac: Regular rate and rhythm. Normal S1/S2.  No murmurs, rubs, nor gallops.   Extremities: No peripheral edema.  Strong peripheral pulses.  Mental Status: No depression, anxiety, nor agitation. Skin: Warm and dry.  Assessment & Plan: Jeffrey Larson was seen today for follow-up.  Diagnoses and associated orders for this visit:  COPD with chronic bronchitis  Essential hypertension, benign  Wheezing - predniSONE (DELTASONE) 20 MG tablet; Three tabs at once daily for five days.    COPD: Controlled on Anoro however mild exacerbation today due to a common cold, start prednisone, no indication for antibiotic at this time. Essential hypertension: Controlled but I believe his antihypertensive regimen is little to aggressive therefore cut back on carvedilol.  Return in about 3 months (around 01/11/2015).

## 2014-11-04 ENCOUNTER — Encounter (HOSPITAL_COMMUNITY): Payer: Self-pay | Admitting: Cardiology

## 2014-11-26 HISTORY — PX: APPENDECTOMY: SHX54

## 2015-01-15 ENCOUNTER — Encounter: Payer: Self-pay | Admitting: Emergency Medicine

## 2015-01-15 ENCOUNTER — Emergency Department
Admission: EM | Admit: 2015-01-15 | Discharge: 2015-01-15 | Disposition: A | Discharge status: Home / Self Care | Payer: BLUE CROSS/BLUE SHIELD | Source: Home / Self Care | Attending: Family Medicine | Admitting: Family Medicine

## 2015-01-15 DIAGNOSIS — N200 Calculus of kidney: Secondary | ICD-10-CM | POA: Diagnosis not present

## 2015-01-15 HISTORY — DX: Disorder of kidney and ureter, unspecified: N28.9

## 2015-01-15 LAB — POCT URINALYSIS DIP (MANUAL ENTRY)
BILIRUBIN UA: NEGATIVE
Bilirubin, UA: NEGATIVE
GLUCOSE UA: NEGATIVE
LEUKOCYTES UA: NEGATIVE
Nitrite, UA: NEGATIVE
PROTEIN UA: NEGATIVE
SPEC GRAV UA: 1.01 (ref 1.005–1.03)
UROBILINOGEN UA: 0.2 (ref 0–1)
pH, UA: 5 (ref 5–8)

## 2015-01-15 MED ORDER — OXYCODONE-ACETAMINOPHEN 5-325 MG PO TABS: 1.0000 | ORAL_TABLET | Freq: Four times a day (QID) | ORAL | Status: DC | PRN

## 2015-01-15 MED ORDER — TAMSULOSIN HCL 0.4 MG PO CAPS: 0.4000 mg | ORAL_CAPSULE | Freq: Every day | ORAL | Status: DC

## 2015-01-15 NOTE — Discharge Instructions (Signed)
Thank you for coming in today. If your belly pain worsens, or you have high fever, bad vomiting, blood in your stool or black tarry stool go to the Emergency Room.  Call or go to the emergency room if you get worse, have trouble breathing, have chest pains, or palpitations.  Go to the ER if you get worse.  Do not drive after taking oxycodone.  Follow up with Urology.    Kidney Stones Kidney stones (urolithiasis) are deposits that form inside your kidneys. The intense pain is caused by the stone moving through the urinary tract. When the stone moves, the ureter goes into spasm around the stone. The stone is usually passed in the urine.  CAUSES   A disorder that makes certain neck glands produce too much parathyroid hormone (primary hyperparathyroidism).  A buildup of uric acid crystals, similar to gout in your joints.  Narrowing (stricture) of the ureter.  A kidney obstruction present at birth (congenital obstruction).  Previous surgery on the kidney or ureters.  Numerous kidney infections. SYMPTOMS   Feeling sick to your stomach (nauseous).  Throwing up (vomiting).  Blood in the urine (hematuria).  Pain that usually spreads (radiates) to the groin.  Frequency or urgency of urination. DIAGNOSIS   Taking a history and physical exam.  Blood or urine tests.  CT scan.  Occasionally, an examination of the inside of the urinary bladder (cystoscopy) is performed. TREATMENT   Observation.  Increasing your fluid intake.  Extracorporeal shock wave lithotripsy--This is a noninvasive procedure that uses shock waves to break up kidney stones.  Surgery may be needed if you have severe pain or persistent obstruction. There are various surgical procedures. Most of the procedures are performed with the use of small instruments. Only small incisions are needed to accommodate these instruments, so recovery time is minimized. The size, location, and chemical composition are all  important variables that will determine the proper choice of action for you. Talk to your health care provider to better understand your situation so that you will minimize the risk of injury to yourself and your kidney.  HOME CARE INSTRUCTIONS   Drink enough water and fluids to keep your urine clear or pale yellow. This will help you to pass the stone or stone fragments.  Strain all urine through the provided strainer. Keep all particulate matter and stones for your health care provider to see. The stone causing the pain may be as small as a grain of salt. It is very important to use the strainer each and every time you pass your urine. The collection of your stone will allow your health care provider to analyze it and verify that a stone has actually passed. The stone analysis will often identify what you can do to reduce the incidence of recurrences.  Only take over-the-counter or prescription medicines for pain, discomfort, or fever as directed by your health care provider.  Make a follow-up appointment with your health care provider as directed.  Get follow-up X-rays if required. The absence of pain does not always mean that the stone has passed. It may have only stopped moving. If the urine remains completely obstructed, it can cause loss of kidney function or even complete destruction of the kidney. It is your responsibility to make sure X-rays and follow-ups are completed. Ultrasounds of the kidney can show blockages and the status of the kidney. Ultrasounds are not associated with any radiation and can be performed easily in a matter of minutes. [place]  IF:  You experience pain that is progressive and unresponsive to any pain medicine you have been prescribed. SEEK IMMEDIATE MEDICAL CARE IF:   Pain cannot be controlled with the prescribed medicine.  You have a fever or shaking chills.  The severity or intensity of pain increases over 18 hours and is not relieved by pain  medicine.  You develop a new onset of abdominal pain.  You feel faint or pass out.  You are unable to urinate. MAKE SURE YOU:   Understand these instructions.  Will watch your condition.  Will get help right away if you are not doing well or get worse. Document Released: 11/12/2005 Document Revised: 07/15/2013 Document Reviewed: 04/15/2013 [facility] Patient Information 2015 [place], [place]. This information is not intended to replace advice given to you by your health care provider. Make sure you discuss any questions you have with your health care provider.

## 2015-01-15 NOTE — ED Notes (Signed)
Reports 2-3 day history of left lateral/posterior rib pain; steady, not intermittent. Denies dysuria or hematuria.

## 2015-01-15 NOTE — ED Provider Notes (Signed)
Jeffrey Larson is a 59 y.o. male who presents to Urgent Care today for left flank pain present for 2 days now. Pain is worsened recently. Pain tends to come and go and comes in waves. The pain is located in the left CV area and does not radiate. Pain is somewhat consistent with previous episode of kidney stones. No vomiting or diarrhea. Patient has tried some leftover hydrocodone which worked a little. He denies any chest pains or palpitations or shortness of breath.   Past Medical History  Diagnosis Date  . Coronary artery disease   . Myocardial infarction   . Afib   . History of pneumonia     RML on CXR 07/20/14  . Hyperlipidemia   . Renal disorder     history kidney stone   Past Surgical History  Procedure Laterality Date  . Coronary stent placement  7/12013  . Appendectomy    . Cardiovascular stress test  03/2014    Borderline reversible ischemic changes at the apex.  Normal LV contractility/EF 52%.  . Left heart catheterization with coronary angiogram N/A 06/12/2012    Procedure: LEFT HEART CATHETERIZATION WITH CORONARY ANGIOGRAM;  Surgeon: Clent Demark, MD;  Location: [facility] CATH LAB;  Service: Cardiovascular;  Laterality: N/A;  . Percutaneous coronary stent intervention (pci-s) N/A 06/17/2012    Procedure: PERCUTANEOUS CORONARY STENT INTERVENTION (PCI-S);  Surgeon: Clent Demark, MD;  Location: [facility] CATH LAB;  Service: Cardiovascular;  Laterality: N/A;   History  Substance Use Topics  . Smoking status: Former Smoker -- 0 years    Types: Cigarettes    Quit date: 11/26/1996  . Smokeless tobacco: Not on file  . Alcohol Use: No   ROS as above Medications: No current facility-administered medications for this encounter.   Current Outpatient Prescriptions  Medication Sig Dispense Refill  . albuterol (PROVENTIL HFA;VENTOLIN HFA) 108 (90 BASE) MCG/ACT inhaler Inhale two puffs every 4-6 hours only as needed for shortness of breath or wheezing. 1 Inhaler 1  . albuterol (PROVENTIL) (5  MG/ML) 0.5% nebulizer solution Take 0.5 mLs (2.5 mg total) by nebulization every 6 (six) hours as needed for wheezing or shortness of breath. 20 mL 1  . aspirin 81 MG tablet Take 81 mg by mouth daily.    Marland Kitchen atorvastatin (LIPITOR) 80 MG tablet Take 1 tablet (80 mg total) by mouth daily at 6 PM. 30 tablet 3  . carvedilol (COREG) 12.5 MG tablet Take 0.5 tablets (6.25 mg total) by mouth 2 (two) times daily with a meal.    . Coenzyme Q10 (CO Q 10) 10 MG CAPS Take 1 capsule by mouth daily.    Marland Kitchen losartan (COZAAR) 50 MG tablet Take 50 mg by mouth daily.    . nitroGLYCERIN (NITROSTAT) 0.4 MG SL tablet Place 1 tablet (0.4 mg total) under the tongue every 5 (five) minutes x 3 doses as needed for chest pain. 25 tablet 3  . Omega-3 Fatty Acids (FISH OIL) 1200 MG CAPS Take 1 capsule by mouth 3 (three) times daily.    Marland Kitchen oxyCODONE-acetaminophen (PERCOCET/ROXICET) 5-325 MG per tablet Take 1-2 tablets by mouth every 6 (six) hours as needed for moderate pain or severe pain. 20 tablet 0  . spironolactone (ALDACTONE) 25 MG tablet Take 1 tablet (25 mg total) by mouth daily.    . tamsulosin (FLOMAX) 0.4 MG CAPS capsule Take 1 capsule (0.4 mg total) by mouth daily. 30 capsule 0  . Umeclidinium-Vilanterol (ANORO ELLIPTA) 62.5-25 MCG/INH AEPB Inhale 1 Units into the lungs  daily. 60 each 11  . Umeclidinium-Vilanterol (ANORO ELLIPTA) 62.5-25 MCG/INH AEPB Inhale 1 Units into the lungs daily. 7 each 0  . warfarin (COUMADIN) 5 MG tablet Take 5 mg by mouth daily. At 6pm    . [DISCONTINUED] amiodarone (PACERONE) 200 MG tablet Take 1 tablet (200 mg total) by mouth 2 (two) times daily. 60 tablet 3  . [DISCONTINUED] ramipril (ALTACE) 2.5 MG capsule Take 2.5 mg by mouth daily.      Allergies  Allergen Reactions  . Ramipril     cough     Exam:  BP 113/77 mmHg  Pulse 77  Temp(Src) 97.7 F (36.5 C) (Oral)  Resp 20  Ht 5\' 11"  (1.803 m)  Wt 300 lb (136.079 kg)  BMI 41.86 kg/m2  SpO2 97% Gen: Well NAD morbidly obese HEENT:  EOMI,  MMM Lungs: Normal work of breathing. CTABL Heart: RRR no MRG Abd: NABS, Soft. Nondistended, Nontender no CV angle tenderness to percussion Exts: Brisk capillary refill, warm and well perfused.   Results for orders placed or performed during the hospital encounter of 01/15/15 (from the past 24 hour(s))  POCT urinalysis dipstick (new)     Status: None   Collection Time: 01/15/15  4:47 PM  Result Value Ref Range   Color, UA light yellow    Clarity, UA clear    Glucose, UA neg    Bilirubin, UA negative    Bilirubin, UA negative    Spec Grav, UA 1.010 1.005 - 1.03   Blood, UA small    pH, UA 5.0 5 - 8   Protein Ur, POC negative    Urobilinogen, UA 0.2 0 - 1   Nitrite, UA Negative    Leukocytes, UA Negative    No results found.  Assessment and Plan: 59 y.o. male with left flank pain. This is likely a kidney stone based on severe worsening flank pain with hematuria and history of kidney stone. Plan to treat with Flomax and oxycodone. Watchful waiting. Follow up with emergency room as needed. Refer to urology.  Discussed warning signs or symptoms. Please see discharge instructions. Patient expresses understanding.     Gregor Hams, MD 01/15/15 [phone]

## 2015-01-17 ENCOUNTER — Telehealth: Payer: Self-pay | Admitting: Emergency Medicine

## 2015-07-20 ENCOUNTER — Encounter: Payer: Self-pay | Admitting: Family Medicine

## 2015-07-20 ENCOUNTER — Ambulatory Visit (INDEPENDENT_AMBULATORY_CARE_PROVIDER_SITE_OTHER): Payer: BLUE CROSS/BLUE SHIELD

## 2015-07-20 ENCOUNTER — Ambulatory Visit (INDEPENDENT_AMBULATORY_CARE_PROVIDER_SITE_OTHER): Payer: BLUE CROSS/BLUE SHIELD | Admitting: Family Medicine

## 2015-07-20 VITALS — BP 144/87 | HR 74 | Ht 71.0 in | Wt 317.0 lb

## 2015-07-20 DIAGNOSIS — S42123A Displaced fracture of acromial process, unspecified shoulder, initial encounter for closed fracture: Secondary | ICD-10-CM | POA: Insufficient documentation

## 2015-07-20 DIAGNOSIS — S42121A Displaced fracture of acromial process, right shoulder, initial encounter for closed fracture: Secondary | ICD-10-CM

## 2015-07-20 DIAGNOSIS — S42124A Nondisplaced fracture of acromial process, right shoulder, initial encounter for closed fracture: Secondary | ICD-10-CM | POA: Diagnosis not present

## 2015-07-20 DIAGNOSIS — X58XXXA Exposure to other specified factors, initial encounter: Secondary | ICD-10-CM

## 2015-07-20 DIAGNOSIS — S4991XA Unspecified injury of right shoulder and upper arm, initial encounter: Secondary | ICD-10-CM | POA: Diagnosis not present

## 2015-07-20 DIAGNOSIS — Z23 Encounter for immunization: Secondary | ICD-10-CM

## 2015-07-20 MED ORDER — OXYCODONE-ACETAMINOPHEN 5-325 MG PO TABS: 1.0000 | ORAL_TABLET | Freq: Four times a day (QID) | ORAL | Status: DC | PRN

## 2015-07-20 NOTE — Progress Notes (Signed)
   Subjective:    I'm seeing this patient as a consultation for:  Dr. Ileene Rubens  CC: Right shoulder pain  HPI: Patient was thrown from a horse yesterday landing on his right arm. He suffered an abrasion to the proximal forearm. He notes considerable anterior and superior right shoulder pain. He notes inability to abduct his right arm without pain. He denies any radiating pain weakness or numbness. He denies hitting his head and also denies any weakness or numbness or neck pain. Patient thinks his last tetanus vaccine was less than 5 years ago.  Past medical history, Surgical history, Family history not pertinant except as noted below, Social history, Allergies, and medications have been entered into the medical record, reviewed, and no changes needed.   Review of Systems: No headache, visual changes, nausea, vomiting, diarrhea, constipation, dizziness, abdominal pain, skin rash, fevers, chills, night sweats, weight loss, swollen lymph nodes, body aches, joint swelling, muscle aches, chest pain, shortness of breath, mood changes, visual or auditory hallucinations.   Objective:    Filed Vitals:   07/20/15 0957  BP: 144/87  Pulse: 74   General: Well Developed, well nourished, and in no acute distress.  Neuro/Psych: Alert and oriented x3, extra-ocular muscles intact, able to move all 4 extremities, sensation grossly intact. Skin: Warm and dry, no rashes noted.  Respiratory: Not using accessory muscles, speaking in full sentences, trachea midline.  Cardiovascular: Pulses palpable, no extremity edema. Abdomen: Does not appear distended. MSK: Right shoulder. Normal-appearing. Tender palpation AC joint.  Proximal humerus nontender. Grip strength pulses capillary refill and sensation intact distally Skin: Abrasion to the right forearm. No deep lacerations.  No results found for this or any previous visit (from the past 24 hour(s)). Dg Shoulder Right  07/20/2015   CLINICAL DATA:  Right shoulder  pain and tenderness  EXAM: RIGHT SHOULDER - 2+ VIEW  COMPARISON:  None.  FINDINGS: There is a comminuted but nondisplaced fracture of the acromion. Humerus and glenoid are intact. The visualize clavicle is grossly intact.  IMPRESSION: Comminuted but nondisplaced fracture of the acromion.   Electronically Signed   By: Marybelle Killings M.D.   On: 07/20/2015 10:16    Impression and Recommendations:   This case required medical decision making of moderate complexity.

## 2015-07-20 NOTE — Assessment & Plan Note (Signed)
Occurred on 07/19/2015. Comminuted but nondisplaced. Plan to treat with sling rest and analgesia.  Return in 2 weeks. At that time we'll perform more dedicated shoulder exam to evaluate possible coexisting rotator cuff tear

## 2015-07-20 NOTE — Patient Instructions (Signed)
Thank you for coming in today. Return in 2 weeks.  Use the sling.  Take oxycodone for pain as needed.   Arm Sling Use A sling is used to:  Limit how much your arm moves.  Make you more comfortable.  Support your arm. The sling fits well if:  Your elbow rests in the bottom and corner pocket.  Only your fingers show at the opening. Your wrist should fit inside and be supported by the sling.  The strap goes around your shoulder or neck for support.  Your arm is fairly level with your hand, slightly higher than your elbow. HOME CARE   Adjust the sling to keep the hand inside. Slings tend to slip, making the elbow point up. Tug the elbow back into place.  The fingers should feel warm and be a normal color.  Try to keep the palm of the hand toward the body while wearing the sling.  Take the sling off when going to sleep if this is okay with your doctor.  Use an extra pillow at night to protect the arm. Slide the arm between a pillow and the cover.  Take baths or showers as told by your doctor.  Only take medicine as told by your doctor. GET HELP RIGHT AWAY IF:   The fingers turn cold or start to tingle.  The arm pain gets worse.  The pain is not helped by medicine or by adjusting the sling. MAKE SURE YOU:   Understand these instructions.  Will watch this condition.  Will get help right away if you are not doing well or get worse. Document Released: 04/30/2008 Document Revised: 02/04/2012 Document Reviewed: 04/30/2008 [facility] Patient Information 2015 [place], [place]. This information is not intended to replace advice given to you by your health care provider. Make sure you discuss any questions you have with your health care provider.

## 2015-08-03 ENCOUNTER — Ambulatory Visit (INDEPENDENT_AMBULATORY_CARE_PROVIDER_SITE_OTHER): Payer: BLUE CROSS/BLUE SHIELD

## 2015-08-03 ENCOUNTER — Encounter: Payer: Self-pay | Admitting: Family Medicine

## 2015-08-03 ENCOUNTER — Ambulatory Visit (INDEPENDENT_AMBULATORY_CARE_PROVIDER_SITE_OTHER): Payer: BLUE CROSS/BLUE SHIELD | Admitting: Family Medicine

## 2015-08-03 VITALS — BP 122/78 | HR 82 | Ht 71.0 in | Wt 315.0 lb

## 2015-08-03 DIAGNOSIS — X58XXXD Exposure to other specified factors, subsequent encounter: Secondary | ICD-10-CM

## 2015-08-03 DIAGNOSIS — S42121D Displaced fracture of acromial process, right shoulder, subsequent encounter for fracture with routine healing: Secondary | ICD-10-CM | POA: Diagnosis not present

## 2015-08-03 NOTE — Assessment & Plan Note (Addendum)
Occurred on 07/19/2015. Comminuted but nondisplaced. Concern for coexisting rotator cuff tear. Bone is healing. Return in a few weeks for recheck shoulder motion. Work on passive range of motion exercises. If strength does not improve would obtain MRI to evaluate rotator cuff tear.  I'm fearful for a full thickness tear.

## 2015-08-03 NOTE — Patient Instructions (Signed)
Thank you for coming in today. Return in 1-2 weeks for recheck.   Work on shoulder range of motion exercises.   Shoulder Range of Motion Exercises The shoulder is the most flexible joint in the human body. Because of this it is also the most unstable joint in the body. All ages can develop shoulder problems. Early treatment of problems is necessary for a good outcome. People react to shoulder pain by decreasing the movement of the joint. After a brief period of time, the shoulder can become "frozen". This is an almost complete loss of the ability to move the damaged shoulder. Following injuries your caregivers can give you instructions on exercises to keep your range of motion (ability to move your shoulder freely), or regain it if it has been lost.  [place]: Codman's Exercise or Pendulum Exercise  This exercise may be performed in a prone (face-down) lying position or standing while leaning on a chair with the opposite arm. Its purpose is to relax the muscles in your shoulder and slowly but surely increase the range of motion and to relieve pain.  Lie on your stomach close to the side edge of the bed. Let your weak arm hang over the edge of the bed. Relax your shoulder, arm and hand. Let your shoulder blade relax and drop down.  Slowly and gently swing your arm forward and back. Do not use your neck muscles; relax them. It might be easier to have someone else gently start swinging your arm.  As pain decreases, increase your swing. To start, arm swing should begin at 15 degree angles. In time and as pain lessens, move to 30-45 degree angles. Start with swinging for about 15 seconds, and work towards swinging for 3 to 5 minutes.  This exercise may also be performed in a standing/bent over position.  Stand and hold onto a sturdy chair with your good arm. Bend forward at the waist and bend your knees slightly to help protect your back. Relax  your weak arm, let it hang limp. Relax your shoulder blade and let it drop.  Keep your shoulder relaxed and use body motion to swing your arm in small circles.  Stand up tall and relax.  Repeat motion and change direction of circles.  Start with swinging for about 30 seconds, and work towards swinging for 3 to 5 minutes. STRETCHING EXERCISES:  Lift your arm out in front of you with the elbow bent at 90 degrees. Using your other arm gently pull the elbow forward and across your body.  Bend one arm behind you with the palm facing outward. Using the other arm, hold a towel or rope and reach this arm up above your head, then bend it at the elbow to move your wrist to behind your neck. Grab the free end of the towel with the hand behind your back. Gently pull the towel up with the hand behind your neck, gradually increasing the pull on the hand behind the small of your back. Then, gradually pull down with the hand behind the small of your back. This will pull the hand and arm behind your neck further. Both shoulders will have an increased range of motion with repetition of this exercise. STRENGTHENING EXERCISES:  Standing with your arm at your side and straight out from your shoulder with the elbow bent at 90 degrees, hold onto a small weight and slowly raise your hand so it points straight up in the  air. Repeat this five times to begin with, and gradually increase to ten times. Do this four times per day. As you grow stronger you can gradually increase the weight.  Repeat the above exercise, only this time using an elastic band. Start with your hand up in the air and pull down until your hand is by your side. As you grow stronger, gradually increase the amount you pull by increasing the number or size of the elastic bands. Use the same amount of repetitions.  Standing with your hand at your side and holding onto a weight, gradually lift the hand in front of you until it is over your head. Do the same  also with the hand remaining at your side and lift the hand away from your body until it is again over your head. Repeat this five times to begin with, and gradually increase to ten times. Do this four times per day. As you grow stronger you can gradually increase the weight. Document Released: 08/11/2003 Document Revised: 11/17/2013 Document Reviewed: 11/12/2005 [facility] Patient Information 2015 [place], [place]. This information is not intended to replace advice given to you by your health care provider. Make sure you discuss any questions you have with your health care provider.

## 2015-08-03 NOTE — Progress Notes (Signed)
Jeffrey Larson is a 59 y.o. male who presents to [facility]  today for follow-up right shoulder. Patient presents to clinic for a two-week follow-up of a right acromion fracture. The patient was thrown from a horse on August 23 landing on his right shoulder. He was seen on the 24th and diagnosed with an acromion fracture. He was treated with a sling.  He presents today in good spirits and good health. He notes the pain is significantly improved. He denies any radiating pain.  He does note weakness in abduction.   Past Medical History  Diagnosis Date  . Coronary artery disease   . Myocardial infarction   . Afib   . History of pneumonia     RML on CXR 07/20/14  . Hyperlipidemia   . Renal disorder     history kidney stone   Past Surgical History  Procedure Laterality Date  . Coronary stent placement  7/12013  . Appendectomy    . Cardiovascular stress test  03/2014    Borderline reversible ischemic changes at the apex.  Normal LV contractility/EF 52%.  . Left heart catheterization with coronary angiogram N/A 06/12/2012    Procedure: LEFT HEART CATHETERIZATION WITH CORONARY ANGIOGRAM;  Surgeon: Clent Demark, MD;  Location: [facility] CATH LAB;  Service: Cardiovascular;  Laterality: N/A;  . Percutaneous coronary stent intervention (pci-s) N/A 06/17/2012    Procedure: PERCUTANEOUS CORONARY STENT INTERVENTION (PCI-S);  Surgeon: Clent Demark, MD;  Location: [facility] CATH LAB;  Service: Cardiovascular;  Laterality: N/A;   Social History  Substance Use Topics  . Smoking status: Former Smoker -- 0 years    Types: Cigarettes    Quit date: 11/26/1996  . Smokeless tobacco: Not on file  . Alcohol Use: No   family history includes Heart disease in his father and mother.  ROS as above Medications: Current Outpatient Prescriptions  Medication Sig Dispense Refill  . albuterol (PROVENTIL HFA;VENTOLIN HFA) 108 (90 BASE) MCG/ACT inhaler Inhale two puffs every 4-6 hours only  as needed for shortness of breath or wheezing. 1 Inhaler 1  . albuterol (PROVENTIL) (5 MG/ML) 0.5% nebulizer solution Take 0.5 mLs (2.5 mg total) by nebulization every 6 (six) hours as needed for wheezing or shortness of breath. 20 mL 1  . aspirin 81 MG tablet Take 81 mg by mouth daily.    . carvedilol (COREG) 12.5 MG tablet Take 0.5 tablets (6.25 mg total) by mouth 2 (two) times daily with a meal.    . losartan (COZAAR) 50 MG tablet Take 50 mg by mouth daily.    Marland Kitchen oxyCODONE-acetaminophen (PERCOCET/ROXICET) 5-325 MG per tablet Take 1-2 tablets by mouth every 6 (six) hours as needed for moderate pain or severe pain. 30 tablet 0  . spironolactone (ALDACTONE) 25 MG tablet Take 1 tablet (25 mg total) by mouth daily.    Marland Kitchen Umeclidinium-Vilanterol (ANORO ELLIPTA) 62.5-25 MCG/INH AEPB Inhale 1 Units into the lungs daily. 60 each 11  . warfarin (COUMADIN) 5 MG tablet Take 5 mg by mouth daily. At 6pm    . atorvastatin (LIPITOR) 80 MG tablet Take 1 tablet (80 mg total) by mouth daily at 6 PM. 30 tablet 3  . nitroGLYCERIN (NITROSTAT) 0.4 MG SL tablet Place 1 tablet (0.4 mg total) under the tongue every 5 (five) minutes x 3 doses as needed for chest pain. 25 tablet 3  . [DISCONTINUED] amiodarone (PACERONE) 200 MG tablet Take 1 tablet (200 mg total) by mouth 2 (two) times daily. 60 tablet  3  . [DISCONTINUED] ramipril (ALTACE) 2.5 MG capsule Take 2.5 mg by mouth daily.      No current facility-administered medications for this visit.   Allergies  Allergen Reactions  . Ramipril     cough     Exam:  BP 122/78 mmHg  Pulse 82  Ht 5\' 11"  (1.803 m)  Wt 315 lb (142.883 kg)  BMI 43.95 kg/m2 Gen: Well NAD HEENT: EOMI,  MMM Right shoulder normal-appearing. Tender to palpation in the area of the before meals joint. Patient has intact passive range of motion with mild pain. Active motion is painful beyond about 30 abduction and forward flexion. Normal external and internal motion. Exts: Brisk capillary  refill, warm and well perfused.    Preliminary shoulder x-ray shows a healing acromion. No results found for this or any previous visit (from the past 24 hour(s)). No results found.   Please see individual assessment and plan sections.

## 2015-08-03 NOTE — Progress Notes (Signed)
Quick Note:  Xray looked ok ______

## 2015-08-10 ENCOUNTER — Encounter: Payer: Self-pay | Admitting: Family Medicine

## 2015-08-10 ENCOUNTER — Ambulatory Visit (INDEPENDENT_AMBULATORY_CARE_PROVIDER_SITE_OTHER): Payer: BLUE CROSS/BLUE SHIELD | Admitting: Family Medicine

## 2015-08-10 VITALS — BP 132/84 | HR 85 | Wt 314.0 lb

## 2015-08-10 DIAGNOSIS — S42121D Displaced fracture of acromial process, right shoulder, subsequent encounter for fracture with routine healing: Secondary | ICD-10-CM

## 2015-08-10 MED ORDER — OXYCODONE-ACETAMINOPHEN 5-325 MG PO TABS: 0.5000 | ORAL_TABLET | Freq: Four times a day (QID) | ORAL | Status: DC | PRN

## 2015-08-10 NOTE — Patient Instructions (Addendum)
Thank you for coming in today. Continue the range of motion exercises.  Return in 3 weeks.   Rotator Cuff Tear The rotator cuff is four tendons that assist in the motion of the shoulder. A rotator cuff tear is a tear in one of these four tendons. It is characterized by pain and weakness of the shoulder. The rotator cuff tendons surround the shoulder ball and socket joint (humeral head). The rotator cuff tendons attach to the shoulder blade (scapula) on one side and the upper arm bone (humerus) on the other side. The rotator cuff is essential for shoulder stability and shoulder motion. SYMPTOMS   Pain around the shoulder, often at the outer portion of the upper arm.  Pain that is worse with shoulder function, especially when reaching overhead or lifting.  Weakness of the shoulder muscles.  Aching when not using your arm; often, pain awakens you at night, especially when sleeping on the affected side.  Tenderness, swelling, warmth, or redness over the outer aspect of the shoulder.  Loss of strength.  Limited motion of the shoulder, especially reaching behind (reaching into one's back pocket) or across your body.  A crackling sound (crepitation) when moving the shoulder.  Biceps tendon pain (in the front of the shoulder) and inflammation, worse with bending the elbow or lifting. CAUSES   Strain from sudden increase in amount or intensity of activity.  Direct blow or injury to the shoulder.  Aging, wear from from normal use.  Roof of the shoulder (acromial) spur. RISK INCREASES WITH:   Contact sports (football, wrestling, or boxing).  Throwing or hitting sports (baseball, tennis, or volleyball).  Weightlifting and bodybuilding.  Heavy labor.  Previous injury to rotator cuff.  Failure to warm up properly before activity.  Inadequate protective equipment.  Increasing age.  Spurring of the outer end of the scapula (acromion).  Cortisone injections.  Poor shoulder  strength and flexibility. PREVENTION  Warm up and stretch properly before activity.  Allow time for rest and recovery between practices and competition.  Maintain physical fitness:  Cardiovascular fitness.  Shoulder flexibility.  Strength and endurance of the rotator cuff muscles and muscles of the shoulder blade.  Learn and use proper technique when throwing or hitting. PROGNOSIS Surgery is often needed. Although, symptoms may go away by themselves. RELATED COMPLICATIONS   Persistent pain that may progress to constant pain.  Shoulder stiffness, frozen shoulder syndrome, or loss of motion.  Recurrence of symptoms, especially if treated without surgery.  Inability to return to same level of sports, even with surgery.  Persistent weakness.  Risks of surgery, including infection, bleeding, injury to nerves, shoulder stiffness, weakness, re-tearing of the rotator cuff tendon.  Deltoid detachment, acromial fracture, and persistent pain. TREATMENT Treatment involves the use of ice and medicine to reduce pain and inflammation. Strengthening and stretching exercise are usually recommended. These exercises may be completed at home or with a therapist. You may also be instructed to modify offending activities. Corticosteroid injections may be given to reduce inflammation. Surgery is usually recommended for athletes. Surgery has the best chance for a full recovery. Surgery involves:  Removal of an inflamed bursa.  Removal of an acromial spur if present.  Suturing the torn tendon back together. Rotator cuff surgeries may be preformed either arthroscopically or through an open incision. Recovery typically takes 6 to 12 months. MEDICATION  If pain medicine is necessary, then nonsteroidal anti-inflammatory medicines, such as aspirin and ibuprofen, or other minor pain relievers, such as acetaminophen, are  often recommended.  Do not take pain medicine for 7 days before  surgery.  Prescription pain relievers are usually only prescribed after surgery. Use only as directed and only as much as you need.  Corticosteroid injections may be given to reduce inflammation. However, there is a limited number of times the joint may be injected with these medicines. HEAT AND COLD  Cold treatment (icing) relieves pain and reduces inflammation. Cold treatment should be applied for 10 to 15 minutes every 2 to 3 hours for inflammation and pain and immediately after any activity that aggravates your symptoms. Use ice packs or massage the area with a piece of ice (ice massage).  Heat treatment may be used prior to performing the stretching and strengthening activities prescribed by your caregiver, physical therapist, or athletic trainer. Use a heat pack or soak the injury in warm water. SEEK MEDICAL CARE IF:   Symptoms get worse or do not improve in 4 to 6 weeks despite treatment.  You experience pain, numbness, or coldness in the hand.  Blue, gray, or dark color appears in the fingernails.  New, unexplained symptoms develop (drugs used in treatment may produce side effects). Document Released: 11/12/2005 Document Revised: 02/04/2012 Document Reviewed: 02/24/2009 [facility] Patient Information 2015 [place], [place]. This information is not intended to replace advice given to you by your health care provider. Make sure you discuss any questions you have with your health care provider.

## 2015-08-10 NOTE — Assessment & Plan Note (Signed)
Fracture is doing well. Concern for full thickness tear. Patient does not want to proceed with further workup of rotator cuff injury. Continue passive range of motion. Follow-up in 2-3 weeks. Refill oxycodone.

## 2015-08-10 NOTE — Progress Notes (Signed)
Jeffrey Larson is a 59 y.o. male who presents to [place]: [facility]  today for follow-up right shoulder fracture. Patient was seen a week ago for follow-up of his right acromion fracture. He notes the pain is somewhat better but he continues to have significant weakness especially with abduction. We were suspicious initially for a rotator cuff tear. Patient has thought about working up to tear and has decided that he would not have surgery to improve his weakness even if he had a full thickness rotator cuff tear. He notes the pain is significantly improved compared to 2 weeks ago and moderately improved compared to 1 week ago. He takes a half of an oxycodone intermittently at night. He would like a refill if possible.   Past Medical History  Diagnosis Date  . Coronary artery disease   . Myocardial infarction   . Afib   . History of pneumonia     RML on CXR 07/20/14  . Hyperlipidemia   . Renal disorder     history kidney stone   Past Surgical History  Procedure Laterality Date  . Coronary stent placement  7/12013  . Appendectomy    . Cardiovascular stress test  03/2014    Borderline reversible ischemic changes at the apex.  Normal LV contractility/EF 52%.  . Left heart catheterization with coronary angiogram N/A 06/12/2012    Procedure: LEFT HEART CATHETERIZATION WITH CORONARY ANGIOGRAM;  Surgeon: Clent Demark, MD;  Location: [facility] CATH LAB;  Service: Cardiovascular;  Laterality: N/A;  . Percutaneous coronary stent intervention (pci-s) N/A 06/17/2012    Procedure: PERCUTANEOUS CORONARY STENT INTERVENTION (PCI-S);  Surgeon: Clent Demark, MD;  Location: [facility] CATH LAB;  Service: Cardiovascular;  Laterality: N/A;   Social History  Substance Use Topics  . Smoking status: Former Smoker -- 0 years    Types: Cigarettes    Quit date: 11/26/1996  . Smokeless tobacco: Not on file  . Alcohol Use: No   family history includes Heart disease in his father and  mother.  ROS as above Medications: Current Outpatient Prescriptions  Medication Sig Dispense Refill  . albuterol (PROVENTIL) (5 MG/ML) 0.5% nebulizer solution Take 0.5 mLs (2.5 mg total) by nebulization every 6 (six) hours as needed for wheezing or shortness of breath. 20 mL 1  . aspirin 81 MG tablet Take 81 mg by mouth daily.    . carvedilol (COREG) 12.5 MG tablet Take 0.5 tablets (6.25 mg total) by mouth 2 (two) times daily with a meal.    . losartan (COZAAR) 50 MG tablet Take 50 mg by mouth daily.    Marland Kitchen oxyCODONE-acetaminophen (PERCOCET/ROXICET) 5-325 MG per tablet Take 0.5-1 tablets by mouth every 6 (six) hours as needed for moderate pain or severe pain. 15 tablet 0  . spironolactone (ALDACTONE) 25 MG tablet Take 1 tablet (25 mg total) by mouth daily.    Marland Kitchen Umeclidinium-Vilanterol (ANORO ELLIPTA) 62.5-25 MCG/INH AEPB Inhale 1 Units into the lungs daily. 60 each 11  . warfarin (COUMADIN) 5 MG tablet Take 5 mg by mouth daily. At 6pm    . albuterol (PROVENTIL HFA;VENTOLIN HFA) 108 (90 BASE) MCG/ACT inhaler Inhale two puffs every 4-6 hours only as needed for shortness of breath or wheezing. 1 Inhaler 1  . atorvastatin (LIPITOR) 80 MG tablet Take 1 tablet (80 mg total) by mouth daily at 6 PM. 30 tablet 3  . nitroGLYCERIN (NITROSTAT) 0.4 MG SL tablet Place 1 tablet (0.4 mg total) under the tongue every 5 (five) minutes  x 3 doses as needed for chest pain. 25 tablet 3  . [DISCONTINUED] amiodarone (PACERONE) 200 MG tablet Take 1 tablet (200 mg total) by mouth 2 (two) times daily. 60 tablet 3  . [DISCONTINUED] ramipril (ALTACE) 2.5 MG capsule Take 2.5 mg by mouth daily.      No current facility-administered medications for this visit.   Allergies  Allergen Reactions  . Ramipril     cough     Exam:  BP 132/84 mmHg  Pulse 85  Wt 314 lb (142.429 kg) Gen: Well NAD Right shoulder normal-appearing. Tender to palpation in the area of the right AC joint. Patient has intact passive range of motion  with mild pain. Active motion is painful beyond about 30 abduction and forward flexion. Normal external and internal motion. Exts: Brisk capillary refill, warm and well perfused.   No results found for this or any previous visit (from the past 24 hour(s)). No results found.   Please see individual assessment and plan sections.

## 2015-08-31 ENCOUNTER — Encounter: Payer: Self-pay | Admitting: Family Medicine

## 2015-08-31 ENCOUNTER — Ambulatory Visit (INDEPENDENT_AMBULATORY_CARE_PROVIDER_SITE_OTHER): Payer: BLUE CROSS/BLUE SHIELD | Admitting: Family Medicine

## 2015-08-31 VITALS — BP 132/84 | HR 69 | Wt 314.0 lb

## 2015-08-31 DIAGNOSIS — S42121D Displaced fracture of acromial process, right shoulder, subsequent encounter for fracture with routine healing: Secondary | ICD-10-CM

## 2015-08-31 NOTE — Assessment & Plan Note (Signed)
Doing well. Likely rotator cuff tear. Continue home exercise range of motion exercises. Follow-up as needed.

## 2015-08-31 NOTE — Progress Notes (Signed)
Jeffrey Larson is a 59 y.o. male who presents to [place]: [facility]  today for follow-up right shoulder. Patient is approximately 5 weeks status post right acromion fracture. He fell off a horse. He states that he has no pain in his shoulder at rest. He notes continued weakness especially with abduction in his right shoulder but is essentially pain-free most activities. He notes normal and intact strength when his arms are close to his body. As discussed last visit he is not interested in pursuing rotator cuff tear workup. He continues home exercises for range of motion of the shoulder.   Past Medical History  Diagnosis Date  . Coronary artery disease   . Myocardial infarction ([place])   . Afib ([place])   . History of pneumonia     RML on CXR 07/20/14  . Hyperlipidemia   . Renal disorder     history kidney stone   Past Surgical History  Procedure Laterality Date  . Coronary stent placement  7/12013  . Appendectomy    . Cardiovascular stress test  03/2014    Borderline reversible ischemic changes at the apex.  Normal LV contractility/EF 52%.  . Left heart catheterization with coronary angiogram N/A 06/12/2012    Procedure: LEFT HEART CATHETERIZATION WITH CORONARY ANGIOGRAM;  Surgeon: Clent Demark, MD;  Location: [facility] CATH LAB;  Service: Cardiovascular;  Laterality: N/A;  . Percutaneous coronary stent intervention (pci-s) N/A 06/17/2012    Procedure: PERCUTANEOUS CORONARY STENT INTERVENTION (PCI-S);  Surgeon: Clent Demark, MD;  Location: [facility] CATH LAB;  Service: Cardiovascular;  Laterality: N/A;   Social History  Substance Use Topics  . Smoking status: Former Smoker -- 0 years    Types: Cigarettes    Quit date: 11/26/1996  . Smokeless tobacco: Not on file  . Alcohol Use: No   family history includes Heart disease in his father and mother.  ROS as above Medications: Current Outpatient Prescriptions  Medication Sig Dispense Refill  . albuterol (PROVENTIL) (5  MG/ML) 0.5% nebulizer solution Take 0.5 mLs (2.5 mg total) by nebulization every 6 (six) hours as needed for wheezing or shortness of breath. 20 mL 1  . aspirin 81 MG tablet Take 81 mg by mouth daily.    . carvedilol (COREG) 12.5 MG tablet Take 0.5 tablets (6.25 mg total) by mouth 2 (two) times daily with a meal.    . losartan (COZAAR) 50 MG tablet Take 50 mg by mouth daily.    Marland Kitchen oxyCODONE-acetaminophen (PERCOCET/ROXICET) 5-325 MG per tablet Take 0.5-1 tablets by mouth every 6 (six) hours as needed for moderate pain or severe pain. 15 tablet 0  . spironolactone (ALDACTONE) 25 MG tablet Take 1 tablet (25 mg total) by mouth daily.    Marland Kitchen Umeclidinium-Vilanterol (ANORO ELLIPTA) 62.5-25 MCG/INH AEPB Inhale 1 Units into the lungs daily. 60 each 11  . warfarin (COUMADIN) 5 MG tablet Take 5 mg by mouth daily. At 6pm    . albuterol (PROVENTIL HFA;VENTOLIN HFA) 108 (90 BASE) MCG/ACT inhaler Inhale two puffs every 4-6 hours only as needed for shortness of breath or wheezing. 1 Inhaler 1  . atorvastatin (LIPITOR) 80 MG tablet Take 1 tablet (80 mg total) by mouth daily at 6 PM. 30 tablet 3  . nitroGLYCERIN (NITROSTAT) 0.4 MG SL tablet Place 1 tablet (0.4 mg total) under the tongue every 5 (five) minutes x 3 doses as needed for chest pain. 25 tablet 3  . [DISCONTINUED] amiodarone (PACERONE) 200 MG tablet Take 1 tablet (200  mg total) by mouth 2 (two) times daily. 60 tablet 3  . [DISCONTINUED] ramipril (ALTACE) 2.5 MG capsule Take 2.5 mg by mouth daily.      No current facility-administered medications for this visit.   Allergies  Allergen Reactions  . Ramipril     cough     Exam:  BP 132/84 mmHg  Pulse 69  Wt 314 lb (142.429 kg) Gen: Well NAD Right shoulder completely nontender even to percussion. Range of motion intact however patient has significant weakness in abduction. Capillary refill and pulses intact distally.  No results found for this or any previous visit (from the past 24 hour(s)). No  results found.   Please see individual assessment and plan sections.

## 2015-08-31 NOTE — Patient Instructions (Signed)
Thank you for coming in today. Continue the shoulder exercises.  Follow up with Dr. Ileene Rubens as previously directed.  Take it easy on the horses.

## 2015-09-26 ENCOUNTER — Other Ambulatory Visit: Payer: Self-pay

## 2015-09-26 DIAGNOSIS — J189 Pneumonia, unspecified organism: Secondary | ICD-10-CM

## 2015-09-27 ENCOUNTER — Other Ambulatory Visit: Payer: Self-pay | Admitting: Family Medicine

## 2015-09-27 DIAGNOSIS — J449 Chronic obstructive pulmonary disease, unspecified: Secondary | ICD-10-CM

## 2015-09-27 MED ORDER — UMECLIDINIUM-VILANTEROL 62.5-25 MCG/INH IN AEPB: 1.0000 [IU] | INHALATION_SPRAY | Freq: Every day | RESPIRATORY_TRACT | Status: DC

## 2015-09-27 MED ORDER — ALBUTEROL SULFATE HFA 108 (90 BASE) MCG/ACT IN AERS: INHALATION_SPRAY | RESPIRATORY_TRACT | Status: DC

## 2015-12-22 ENCOUNTER — Encounter: Payer: Self-pay | Admitting: Family Medicine

## 2015-12-22 ENCOUNTER — Ambulatory Visit (INDEPENDENT_AMBULATORY_CARE_PROVIDER_SITE_OTHER): Payer: BLUE CROSS/BLUE SHIELD | Admitting: Family Medicine

## 2015-12-22 VITALS — BP 124/106 | HR 93 | Temp 98.0°F | Wt 321.0 lb

## 2015-12-22 DIAGNOSIS — J189 Pneumonia, unspecified organism: Secondary | ICD-10-CM | POA: Diagnosis not present

## 2015-12-22 DIAGNOSIS — J441 Chronic obstructive pulmonary disease with (acute) exacerbation: Secondary | ICD-10-CM | POA: Diagnosis not present

## 2015-12-22 DIAGNOSIS — J449 Chronic obstructive pulmonary disease, unspecified: Secondary | ICD-10-CM

## 2015-12-22 MED ORDER — AZITHROMYCIN 250 MG PO TABS: 250.0000 mg | ORAL_TABLET | Freq: Every day | ORAL | Status: DC

## 2015-12-22 MED ORDER — ALBUTEROL SULFATE 108 (90 BASE) MCG/ACT IN AEPB: 1.0000 | INHALATION_SPRAY | Freq: Four times a day (QID) | RESPIRATORY_TRACT | Status: DC | PRN

## 2015-12-22 MED ORDER — GUAIFENESIN-CODEINE 100-10 MG/5ML PO SOLN: 5.0000 mL | Freq: Every evening | ORAL | Status: DC | PRN

## 2015-12-22 MED ORDER — PREDNISONE 50 MG PO TABS: 50.0000 mg | ORAL_TABLET | Freq: Every day | ORAL | Status: DC

## 2015-12-22 MED ORDER — UMECLIDINIUM-VILANTEROL 62.5-25 MCG/INH IN AEPB: 1.0000 | INHALATION_SPRAY | Freq: Every day | RESPIRATORY_TRACT | Status: DC

## 2015-12-22 MED ORDER — IPRATROPIUM-ALBUTEROL 0.5-2.5 (3) MG/3ML IN SOLN
3.0000 mL | Freq: Once | RESPIRATORY_TRACT | Status: AC
  Administered 2015-12-22: 3 mL via RESPIRATORY_TRACT

## 2015-12-22 NOTE — Patient Instructions (Signed)
Thank you for coming in today. Take prednisone and Azithromycin daily for 5 days.  Use the albuterol as needed.  Use the anoro daily.  Follow up with Dr. Lemmie Evens in 1 month.   Call or go to the emergency room if you get worse, have trouble breathing, have chest pains, or palpitations.   Chronic Obstructive Pulmonary Disease Exacerbation Chronic obstructive pulmonary disease (COPD) is a common lung condition in which airflow from the lungs is limited. COPD is a general term that can be used to describe many different lung problems that limit airflow, including chronic bronchitis and emphysema. COPD exacerbations are episodes when breathing symptoms become much worse and require extra treatment. Without treatment, COPD exacerbations can be life threatening, and frequent COPD exacerbations can cause further damage to your lungs. CAUSES  Respiratory infections.  Exposure to smoke.  Exposure to air pollution, chemical fumes, or dust. Sometimes there is no apparent cause or trigger. RISK FACTORS  Smoking cigarettes.  Older age.  Frequent prior COPD exacerbations. SIGNS AND SYMPTOMS  Increased coughing.  Increased thick spit (sputum) production.  Increased wheezing.  Increased shortness of breath.  Rapid breathing.  Chest tightness. DIAGNOSIS Your medical history, a physical exam, and tests will help your health care provider make a diagnosis. Tests may include:  A chest X-ray.  Basic lab tests.  Sputum testing.  An arterial blood gas test. TREATMENT Depending on the severity of your COPD exacerbation, you may need to be admitted to a hospital for treatment. Some of the treatments commonly used to treat COPD exacerbations are:   Antibiotic medicines.  Bronchodilators. These are drugs that expand the air passages. They may be given with an inhaler or nebulizer. Spacer devices may be needed to help improve drug delivery.  Corticosteroid medicines.  Supplemental oxygen  therapy.  Airway clearing techniques, such as noninvasive ventilation (NIV) and positive expiratory pressure (PEP). These provide respiratory support through a mask or other noninvasive device. HOME CARE INSTRUCTIONS  Do not smoke. Quitting smoking is very important to prevent COPD from getting worse and exacerbations from happening as often.  Avoid exposure to all substances that irritate the airway, especially to tobacco smoke.  If you were prescribed an antibiotic medicine, finish it all even if you start to feel better.  Take all medicines as directed by your health care provider.It is important to use correct technique with inhaled medicines.  Drink enough fluids to keep your urine clear or pale yellow (unless you have a medical condition that requires fluid restriction).  Use a cool mist vaporizer. This makes it easier to clear your chest when you cough.  If you have a home nebulizer and oxygen, continue to use them as directed.  Maintain all necessary vaccinations to prevent infections.  Exercise regularly.  Eat a healthy diet.  Keep all follow-up appointments as directed by your health care provider. SEEK IMMEDIATE MEDICAL CARE IF:  You have worsening shortness of breath.  You have trouble talking.  You have severe chest pain.  You have blood in your sputum.  You have a fever.  You have weakness, vomit repeatedly, or faint.  You feel confused.  You continue to get worse. MAKE SURE YOU:  Understand these instructions.  Will watch your condition.  Will get help right away if you are not doing well or get worse.   This information is not intended to replace advice given to you by your health care provider. Make sure you discuss any questions you have with  your health care provider.   Document Released: 09/09/2007 Document Revised: 12/03/2014 Document Reviewed: 07/17/2013 Elsevier Interactive Patient Education [employer].

## 2015-12-22 NOTE — Assessment & Plan Note (Signed)
Current symptoms are consistent with COPD exacerbation. Plan to treat with prednisone burst, albuterol, azithromycin antibiotics, and codeine cough syrup. Refill Anoro. F/u with PCP in 2-4 weeks.

## 2015-12-22 NOTE — Progress Notes (Signed)
Jeffrey Larson is a 60 y.o. male who presents to [place]: Primary Care today for cough congestion and shortness of breath. Patient has a one-week history of worsening cough congestion and shortness of breath. This is associated with wheezing. Cough is mildly productive. No fevers chills nausea vomiting diarrhea chest pain or new palpitations. He has been using his home albuterol nebulizer for the last few days which has helped. He has a history of COPD previously well controlled with Anoro however this has run out.   Past Medical History  Diagnosis Date  . Coronary artery disease   . Myocardial infarction ([place])   . Afib ([place])   . History of pneumonia     RML on CXR 07/20/14  . Hyperlipidemia   . Renal disorder     history kidney stone   Past Surgical History  Procedure Laterality Date  . Coronary stent placement  7/12013  . Appendectomy    . Cardiovascular stress test  03/2014    Borderline reversible ischemic changes at the apex.  Normal LV contractility/EF 52%.  . Left heart catheterization with coronary angiogram N/A 06/12/2012    Procedure: LEFT HEART CATHETERIZATION WITH CORONARY ANGIOGRAM;  Surgeon: Clent Demark, MD;  Location: [facility] CATH LAB;  Service: Cardiovascular;  Laterality: N/A;  . Percutaneous coronary stent intervention (pci-s) N/A 06/17/2012    Procedure: PERCUTANEOUS CORONARY STENT INTERVENTION (PCI-S);  Surgeon: Clent Demark, MD;  Location: [facility] CATH LAB;  Service: Cardiovascular;  Laterality: N/A;   Social History  Substance Use Topics  . Smoking status: Former Smoker -- 0 years    Types: Cigarettes    Quit date: 11/26/1996  . Smokeless tobacco: Not on file  . Alcohol Use: No   family history includes Heart disease in his father and mother.  ROS as above Medications: Current Outpatient Prescriptions  Medication Sig Dispense Refill  . albuterol (PROVENTIL  HFA;VENTOLIN HFA) 108 (90 BASE) MCG/ACT inhaler Follow up appointment needed for future refills. Inhale two puffs every 4-6 hours only as needed for shortness of breath or wheezing. 1 Inhaler 0  . albuterol (PROVENTIL) (5 MG/ML) 0.5% nebulizer solution Take 0.5 mLs (2.5 mg total) by nebulization every 6 (six) hours as needed for wheezing or shortness of breath. 20 mL 1  . aspirin 81 MG tablet Take 81 mg by mouth daily.    . carvedilol (COREG) 12.5 MG tablet Take 0.5 tablets (6.25 mg total) by mouth 2 (two) times daily with a meal.    . losartan (COZAAR) 50 MG tablet Take 50 mg by mouth daily.    Marland Kitchen oxyCODONE-acetaminophen (PERCOCET/ROXICET) 5-325 MG per tablet Take 0.5-1 tablets by mouth every 6 (six) hours as needed for moderate pain or severe pain. 15 tablet 0  . spironolactone (ALDACTONE) 25 MG tablet Take 1 tablet (25 mg total) by mouth daily.    Marland Kitchen Umeclidinium-Vilanterol (ANORO ELLIPTA) 62.5-25 MCG/INH AEPB Inhale 1 puff into the lungs daily. 60 each 6  . warfarin (COUMADIN) 5 MG tablet Take 5 mg by mouth daily. At 6pm    . Albuterol Sulfate (PROAIR RESPICLICK) 123XX123 (90 Base) MCG/ACT AEPB Inhale 1 puff into the lungs every 6 (six) hours as needed (sob). 1 each 1  . atorvastatin (LIPITOR) 80 MG tablet Take 1 tablet (80 mg total) by mouth daily at 6 PM. 30 tablet 3  . azithromycin (ZITHROMAX) 250 MG tablet Take 1 tablet (250 mg total) by mouth daily. Take first 2 tablets together,  then 1 every day until finished. 6 tablet 0  . guaiFENesin-codeine 100-10 MG/5ML syrup Take 5 mLs by mouth at bedtime as needed for cough. 120 mL 0  . nitroGLYCERIN (NITROSTAT) 0.4 MG SL tablet Place 1 tablet (0.4 mg total) under the tongue every 5 (five) minutes x 3 doses as needed for chest pain. 25 tablet 3  . predniSONE (DELTASONE) 50 MG tablet Take 1 tablet (50 mg total) by mouth daily. 5 tablet 0  . [DISCONTINUED] amiodarone (PACERONE) 200 MG tablet Take 1 tablet (200 mg total) by mouth 2 (two) times daily. 60 tablet  3  . [DISCONTINUED] ramipril (ALTACE) 2.5 MG capsule Take 2.5 mg by mouth daily.      No current facility-administered medications for this visit.   Allergies  Allergen Reactions  . Ramipril     cough     Exam:  BP 124/106 mmHg  Pulse 93  Temp(Src) 98 F (36.7 C)  Wt 321 lb (145.605 kg)  SpO2 96% Gen: Well NAD nontoxic appearing HEENT: EOMI,  MMM Lungs: Normal work of breathing. Prolonged expiratory phase with coarse breath sounds and wheezing present bilaterally. Heart: RRR no MRG Abd: NABS, Soft. Nondistended, Nontender Exts: Brisk capillary refill, warm and well perfused. No significant edema  She was given a 2.5/0.5 mg DuoNeb nebulizer treatment, and felt much better.   No results found for this or any previous visit (from the past 24 hour(s)). No results found.   Please see individual assessment and plan sections.

## 2016-01-05 ENCOUNTER — Ambulatory Visit (INDEPENDENT_AMBULATORY_CARE_PROVIDER_SITE_OTHER): Payer: BLUE CROSS/BLUE SHIELD | Admitting: Family Medicine

## 2016-01-05 ENCOUNTER — Encounter: Payer: Self-pay | Admitting: Family Medicine

## 2016-01-05 VITALS — BP 115/70 | HR 77 | Wt 317.0 lb

## 2016-01-05 DIAGNOSIS — J449 Chronic obstructive pulmonary disease, unspecified: Secondary | ICD-10-CM

## 2016-01-05 DIAGNOSIS — J4489 Other specified chronic obstructive pulmonary disease: Secondary | ICD-10-CM

## 2016-01-05 MED ORDER — PREDNISONE 20 MG PO TABS: ORAL_TABLET | ORAL | Status: AC

## 2016-01-05 NOTE — Progress Notes (Signed)
CC: Jeffrey Larson is a 60 y.o. male is here for COPD and Tinnitus   Subjective: HPI:  Follow-up COPD: Since taking prednisone burst, azithromycin and restarting on Anoro he's only had to use albuterol 5 times in the last 5 weeks. He tells me he significantly better but still has a daily productive cough that's interfering with quality of life. Nothing seems to make the cough better or worse other than albuterol however he is trying to conserve the albuterol that he has at home. There is no blood in the sputum. Nothing seems to produce the sputum that he knows of. He denies shortness of breath. He denies chest discomfort. There's been no fevers, chills nor confusion over the past 2 weeks.   Review Of Systems Outlined In HPI  Past Medical History  Diagnosis Date  . Coronary artery disease   . Myocardial infarction ([place])   . Afib ([place])   . History of pneumonia     RML on CXR 07/20/14  . Hyperlipidemia   . Renal disorder     history kidney stone    Past Surgical History  Procedure Laterality Date  . Coronary stent placement  7/12013  . Appendectomy    . Cardiovascular stress test  03/2014    Borderline reversible ischemic changes at the apex.  Normal LV contractility/EF 52%.  . Left heart catheterization with coronary angiogram N/A 06/12/2012    Procedure: LEFT HEART CATHETERIZATION WITH CORONARY ANGIOGRAM;  Surgeon: Clent Demark, MD;  Location: [facility] CATH LAB;  Service: Cardiovascular;  Laterality: N/A;  . Percutaneous coronary stent intervention (pci-s) N/A 06/17/2012    Procedure: PERCUTANEOUS CORONARY STENT INTERVENTION (PCI-S);  Surgeon: Clent Demark, MD;  Location: [facility] CATH LAB;  Service: Cardiovascular;  Laterality: N/A;   Family History  Problem Relation Age of Onset  . Heart disease Mother   . Heart disease Father     Social History   Social History  . Marital Status: Married    Spouse Name: N/A  . Number of Children: N/A  . Years of Education: N/A   Occupational  History  . Not on file.   Social History Main Topics  . Smoking status: Former Smoker -- 0 years    Types: Cigarettes    Quit date: 11/26/1996  . Smokeless tobacco: Not on file  . Alcohol Use: No  . Drug Use: No  . Sexual Activity: Yes   Other Topics Concern  . Not on file   Social History Narrative     Objective: BP 115/70 mmHg  Pulse 77  Wt 317 lb (143.79 kg)  SpO2 96%  General: Alert and Oriented, No Acute Distress HEENT: Pupils equal, round, reactive to light. Conjunctivae clear.  External ears unremarkable, canals clear with intact TMs with appropriate landmarks.  Middle ear appears open without effusion. Pink inferior turbinates.  Moist mucous membranes, pharynx without inflammation nor lesions.  Neck supple without palpable lymphadenopathy nor abnormal masses. Lungs: Trace end expiratory rhonchi in the right posterior upper lung field without wheezing or rales Cardiac: Regular rate and rhythm. Normal S1/S2.  No murmurs, rubs, nor gallops.   Extremities: No peripheral edema.  Strong peripheral pulses.  Mental Status: No depression, anxiety, nor agitation. Skin: Warm and dry.  Assessment & Plan: Jeffrey Larson was seen today for copd and tinnitus.  Diagnoses and all orders for this visit:  COPD with chronic bronchitis ([place]) -     predniSONE (DELTASONE) 20 MG tablet; Three tabs daily days 1-3, two tabs daily  days 4-6, one tab daily days 7-9, half tab daily days 10-13.   COPD: Uncontrolled chronic condition, I'm optimistic that if he starts a prednisone taper it will quickly get him Back to his baseline COPD. Continue Anoro and as needed albuterol or cough. Thankfully he can swing his right arm around in circles without any acromial clavicular pain today.  25 minutes spent face-to-face during visit today of which at least 50% was counseling or coordinating care regarding: 1. COPD with chronic bronchitis ([place])        Return in about 3 months (around 04/03/2016).

## 2016-03-13 ENCOUNTER — Emergency Department
Admission: EM | Admit: 2016-03-13 | Discharge: 2016-03-13 | Disposition: A | Discharge status: Home / Self Care | Payer: BLUE CROSS/BLUE SHIELD | Source: Home / Self Care | Attending: Family Medicine | Admitting: Family Medicine

## 2016-03-13 ENCOUNTER — Encounter: Payer: Self-pay | Admitting: *Deleted

## 2016-03-13 DIAGNOSIS — J441 Chronic obstructive pulmonary disease with (acute) exacerbation: Secondary | ICD-10-CM | POA: Diagnosis not present

## 2016-03-13 DIAGNOSIS — J209 Acute bronchitis, unspecified: Secondary | ICD-10-CM

## 2016-03-13 MED ORDER — IPRATROPIUM-ALBUTEROL 0.5-2.5 (3) MG/3ML IN SOLN
3.0000 mL | Freq: Once | RESPIRATORY_TRACT | Status: AC
  Administered 2016-03-13: 3 mL via RESPIRATORY_TRACT

## 2016-03-13 MED ORDER — AZITHROMYCIN 250 MG PO TABS: 250.0000 mg | ORAL_TABLET | Freq: Every day | ORAL | Status: DC

## 2016-03-13 MED ORDER — METHYLPREDNISOLONE SODIUM SUCC 40 MG IJ SOLR
80.0000 mg | Freq: Once | INTRAMUSCULAR | Status: AC
  Administered 2016-03-13: 80 mg via INTRAMUSCULAR

## 2016-03-13 MED ORDER — PREDNISONE 20 MG PO TABS: ORAL_TABLET | ORAL | Status: DC

## 2016-03-13 NOTE — Discharge Instructions (Signed)

## 2016-03-13 NOTE — ED Provider Notes (Signed)
CSN: [id number]     Arrival date & time 03/13/16  [id number] History   First MD Initiated Contact with Patient 03/13/16 [id number]     Chief Complaint  Patient presents with  . Cough   (Consider location/radiation/quality/duration/timing/severity/associated sxs/prior Treatment) HPI The pt is a 60yo male with hx of COPD, presenting to [facility] with c/o 1 week of worsening moderately productive cough, SOB on exertion, and wheeze.  Denies fever, chills, n/v/d.  He notes his wife was recently in the hospital so he was visiting in the hospital for 3 days and wonders if he got something while there. No recent travel. He has been using his inhalers w/o relief. He has had prednisone in the past for his COPD and does well.   Past Medical History  Diagnosis Date  . Coronary artery disease   . Myocardial infarction ([place])   . Afib ([place])   . History of pneumonia     RML on CXR 07/20/14  . Hyperlipidemia   . Renal disorder     history kidney stone   Past Surgical History  Procedure Laterality Date  . Coronary stent placement  7/12013  . Appendectomy    . Cardiovascular stress test  03/2014    Borderline reversible ischemic changes at the apex.  Normal LV contractility/EF 52%.  . Left heart catheterization with coronary angiogram N/A 06/12/2012    Procedure: LEFT HEART CATHETERIZATION WITH CORONARY ANGIOGRAM;  Surgeon: Clent Demark, MD;  Location: [facility] CATH LAB;  Service: Cardiovascular;  Laterality: N/A;  . Percutaneous coronary stent intervention (pci-s) N/A 06/17/2012    Procedure: PERCUTANEOUS CORONARY STENT INTERVENTION (PCI-S);  Surgeon: Clent Demark, MD;  Location: [facility] CATH LAB;  Service: Cardiovascular;  Laterality: N/A;   Family History  Problem Relation Age of Onset  . Heart disease Mother   . Heart disease Father    Social History  Substance Use Topics  . Smoking status: Former Smoker -- 0 years    Types: Cigarettes    Quit date: 11/26/1996  . Smokeless tobacco: None  . Alcohol Use: No     Review of Systems  Constitutional: Negative for fever and chills.  HENT: Positive for congestion. Negative for ear pain, sore throat, trouble swallowing and voice change.   Respiratory: Positive for cough, chest tightness, shortness of breath and wheezing.   Cardiovascular: Negative for chest pain and palpitations.  Gastrointestinal: Negative for nausea, vomiting, abdominal pain and diarrhea.  Musculoskeletal: Negative for myalgias, back pain and arthralgias.  Skin: Negative for rash.    Allergies  Ramipril  Home Medications   Prior to Admission medications   Medication Sig Start Date End Date Taking? Authorizing Provider  albuterol (PROVENTIL) (5 MG/ML) 0.5% nebulizer solution Take 0.5 mLs (2.5 mg total) by nebulization every 6 (six) hours as needed for wheezing or shortness of breath. 07/20/14   Gregor Hams, MD  Albuterol Sulfate (PROAIR RESPICLICK) 123XX123 (90 Base) MCG/ACT AEPB Inhale 1 puff into the lungs every 6 (six) hours as needed (sob). 12/22/15   Gregor Hams, MD  aspirin 81 MG tablet Take 81 mg by mouth daily.    Historical Provider, MD  atorvastatin (LIPITOR) 80 MG tablet Take 1 tablet (80 mg total) by mouth daily at 6 PM. 06/18/12 07/20/14  [name], MD  azithromycin (ZITHROMAX) 250 MG tablet Take 1 tablet (250 mg total) by mouth daily. Take first 2 tablets together, then 1 every day until finished. 03/13/16   Noland Fordyce, PA-C  carvedilol (COREG) 12.5  MG tablet Take 0.5 tablets (6.25 mg total) by mouth 2 (two) times daily with a meal. 10/11/14   Sean Hommel, DO  losartan (COZAAR) 50 MG tablet Take 50 mg by mouth daily.    Historical Provider, MD  nitroGLYCERIN (NITROSTAT) 0.4 MG SL tablet Place 1 tablet (0.4 mg total) under the tongue every 5 (five) minutes x 3 doses as needed for chest pain. 06/18/12 07/20/14  [name], MD  predniSONE (DELTASONE) 20 MG tablet 3 tabs po day one, then 2 po daily x 4 days 03/13/16   Noland Fordyce, PA-C  spironolactone (ALDACTONE) 25 MG  tablet Take 1 tablet (25 mg total) by mouth daily. 08/12/14   Sean Hommel, DO  Umeclidinium-Vilanterol (ANORO ELLIPTA) 62.5-25 MCG/INH AEPB Inhale 1 puff into the lungs daily. 12/22/15   Gregor Hams, MD  warfarin (COUMADIN) 5 MG tablet Take 5 mg by mouth daily. At 6pm    Historical Provider, MD   Meds Ordered and Administered this Visit   Medications  methylPREDNISolone sodium succinate (SOLU-MEDROL) 40 mg/mL injection 80 mg (80 mg Intramuscular Given 03/13/16 1007)  ipratropium-albuterol (DUONEB) 0.5-2.5 (3) MG/3ML nebulizer solution 3 mL (3 mLs Nebulization Given 03/13/16 1007)    BP 97/72 mmHg  Pulse 58  Temp(Src) 98 F (36.7 C) (Oral)  Resp 16  Wt 317 lb (143.79 kg)  SpO2 96% No data found.   Physical Exam  Constitutional: He appears well-developed and well-nourished.  HENT:  Head: Normocephalic and atraumatic.  Right Ear: Tympanic membrane normal.  Left Ear: Tympanic membrane normal.  Nose: Nose normal.  Mouth/Throat: Uvula is midline, oropharynx is clear and moist and mucous membranes are normal.  Eyes: Conjunctivae are normal. No scleral icterus.  Neck: Normal range of motion. Neck supple.  Cardiovascular: Normal rate, regular rhythm and normal heart sounds.   Pulmonary/Chest: Effort normal. No respiratory distress. He has wheezes. He has no rales. He exhibits no tenderness.  Diffuse inspiratory and expiratory wheeze. Able to speak in full sentences w/o difficulty. No accessory muscle use.   Abdominal: Soft. He exhibits no distension. There is no tenderness.  Musculoskeletal: Normal range of motion.  Neurological: He is alert.  Skin: Skin is warm and dry.  Nursing note and vitals reviewed.   ED Course  Procedures (including critical care time)  Labs Review Labs Reviewed - No data to display  Imaging Review No results found.   MDM   1. COPD exacerbation ([place])   2. Acute bronchitis, unspecified organism    Pt c/o COPD exacerbation for 1 week.  Diffuse wheeze  and rhonchi on exam. O2 Sat 96% on RA  Duoneb given. Pt states he feels much improved.  Wheeze and mild rhonchi still present.   Will cover for bacterial cause of exacerbation given recent exposure to hospital environment visiting his wife.    Rx: azithromycin (pt has had in the past w/o issues), prednisone  F/u with PCP in 1 week if not improving, sooner if worsening. Patient verbalized understanding and agreement with treatment plan.     Noland Fordyce, PA-C 03/13/16 1137

## 2016-03-13 NOTE — ED Notes (Signed)
Pt c/o productive cough and SOB with exertion x 1 week. Afebrile, h/o COPD

## 2016-03-16 ENCOUNTER — Telehealth: Payer: Self-pay | Admitting: *Deleted

## 2016-03-20 ENCOUNTER — Ambulatory Visit (INDEPENDENT_AMBULATORY_CARE_PROVIDER_SITE_OTHER): Payer: BLUE CROSS/BLUE SHIELD | Admitting: Family Medicine

## 2016-03-20 ENCOUNTER — Encounter: Payer: Self-pay | Admitting: Family Medicine

## 2016-03-20 ENCOUNTER — Ambulatory Visit (INDEPENDENT_AMBULATORY_CARE_PROVIDER_SITE_OTHER): Payer: BLUE CROSS/BLUE SHIELD

## 2016-03-20 VITALS — BP 92/67 | HR 82 | Temp 97.6°F | Wt 316.0 lb

## 2016-03-20 DIAGNOSIS — I517 Cardiomegaly: Secondary | ICD-10-CM

## 2016-03-20 DIAGNOSIS — J441 Chronic obstructive pulmonary disease with (acute) exacerbation: Secondary | ICD-10-CM | POA: Diagnosis not present

## 2016-03-20 DIAGNOSIS — J42 Unspecified chronic bronchitis: Secondary | ICD-10-CM | POA: Diagnosis not present

## 2016-03-20 DIAGNOSIS — R05 Cough: Secondary | ICD-10-CM | POA: Diagnosis not present

## 2016-03-20 MED ORDER — FLUTICASONE FUROATE 200 MCG/ACT IN AEPB: 1.0000 | INHALATION_SPRAY | Freq: Every day | RESPIRATORY_TRACT | Status: DC

## 2016-03-20 MED ORDER — CEFDINIR 300 MG PO CAPS: 300.0000 mg | ORAL_CAPSULE | Freq: Two times a day (BID) | ORAL | Status: DC

## 2016-03-20 MED ORDER — PREDNISONE 10 MG (48) PO TBPK: ORAL_TABLET | Freq: Every day | ORAL | Status: DC

## 2016-03-20 NOTE — Progress Notes (Signed)
Quick Note:  X-ray shows bronchitis but no pneumonia. ______

## 2016-03-20 NOTE — Patient Instructions (Signed)
Thank you for coming in today. Continue the Albuterol. Use a longer stronger prednisone course.  Use the other antibiotic.  Start the Arnuity inhaler.  Follow up with Dr. Ileene Rubens.   Chronic Obstructive Pulmonary Disease Exacerbation Chronic obstructive pulmonary disease (COPD) is a common lung condition in which airflow from the lungs is limited. COPD is a general term that can be used to describe many different lung problems that limit airflow, including chronic bronchitis and emphysema. COPD exacerbations are episodes when breathing symptoms become much worse and require extra treatment. Without treatment, COPD exacerbations can be life threatening, and frequent COPD exacerbations can cause further damage to your lungs. CAUSES  Respiratory infections.  Exposure to smoke.  Exposure to air pollution, chemical fumes, or dust. Sometimes there is no apparent cause or trigger. RISK FACTORS  Smoking cigarettes.  Older age.  Frequent prior COPD exacerbations. SIGNS AND SYMPTOMS  Increased coughing.  Increased thick spit (sputum) production.  Increased wheezing.  Increased shortness of breath.  Rapid breathing.  Chest tightness. DIAGNOSIS Your medical history, a physical exam, and tests will help your health care provider make a diagnosis. Tests may include:  A chest X-ray.  Basic lab tests.  Sputum testing.  An arterial blood gas test. TREATMENT Depending on the severity of your COPD exacerbation, you may need to be admitted to a hospital for treatment. Some of the treatments commonly used to treat COPD exacerbations are:   Antibiotic medicines.  Bronchodilators. These are drugs that expand the air passages. They may be given with an inhaler or nebulizer. Spacer devices may be needed to help improve drug delivery.  Corticosteroid medicines.  Supplemental oxygen therapy.  Airway clearing techniques, such as noninvasive ventilation (NIV) and positive expiratory  pressure (PEP). These provide respiratory support through a mask or other noninvasive device. HOME CARE INSTRUCTIONS  Do not smoke. Quitting smoking is very important to prevent COPD from getting worse and exacerbations from happening as often.  Avoid exposure to all substances that irritate the airway, especially to tobacco smoke.  If you were prescribed an antibiotic medicine, finish it all even if you start to feel better.  Take all medicines as directed by your health care provider.It is important to use correct technique with inhaled medicines.  Drink enough fluids to keep your urine clear or pale yellow (unless you have a medical condition that requires fluid restriction).  Use a cool mist vaporizer. This makes it easier to clear your chest when you cough.  If you have a home nebulizer and oxygen, continue to use them as directed.  Maintain all necessary vaccinations to prevent infections.  Exercise regularly.  Eat a healthy diet.  Keep all follow-up appointments as directed by your health care provider. SEEK IMMEDIATE MEDICAL CARE IF:  You have worsening shortness of breath.  You have trouble talking.  You have severe chest pain.  You have blood in your sputum.  You have a fever.  You have weakness, vomit repeatedly, or faint.  You feel confused.  You continue to get worse. MAKE SURE YOU:  Understand these instructions.  Will watch your condition.  Will get help right away if you are not doing well or get worse.   This information is not intended to replace advice given to you by your health care provider. Make sure you discuss any questions you have with your health care provider.   Document Released: 09/09/2007 Document Revised: 12/03/2014 Document Reviewed: 07/17/2013 Elsevier Interactive Patient Education [employer].

## 2016-03-20 NOTE — Progress Notes (Signed)
Jeffrey Larson is a 60 y.o. male who presents to [facility] today for thin shortness of breath. Patient has a history of COPD and recently was seen in the urgent care for cough and congestion thought to be due to a COPD exacerbation. He was given a prednisone course, and azithromycin Dosepak. He was fitted with medicines and notes that symptoms have not improved very much. His symptoms are somewhat consistent with previous episodes of pneumonia. He denies any fevers or chills. He does note shortness of breath cough and wheezing. He's been using his home albuterol nebulizer about 3 times daily which does help. He continues to take the Anoro inhaler daily which typically helps.   Past Medical History  Diagnosis Date  . Coronary artery disease   . Myocardial infarction ([place])   . Afib ([place])   . History of pneumonia     RML on CXR 07/20/14  . Hyperlipidemia   . Renal disorder     history kidney stone   Past Surgical History  Procedure Laterality Date  . Coronary stent placement  7/12013  . Appendectomy    . Cardiovascular stress test  03/2014    Borderline reversible ischemic changes at the apex.  Normal LV contractility/EF 52%.  . Left heart catheterization with coronary angiogram N/A 06/12/2012    Procedure: LEFT HEART CATHETERIZATION WITH CORONARY ANGIOGRAM;  Surgeon: Clent Demark, MD;  Location: [facility] CATH LAB;  Service: Cardiovascular;  Laterality: N/A;  . Percutaneous coronary stent intervention (pci-s) N/A 06/17/2012    Procedure: PERCUTANEOUS CORONARY STENT INTERVENTION (PCI-S);  Surgeon: Clent Demark, MD;  Location: [facility] CATH LAB;  Service: Cardiovascular;  Laterality: N/A;   Social History  Substance Use Topics  . Smoking status: Former Smoker -- 0 years    Types: Cigarettes    Quit date: 11/26/1996  . Smokeless tobacco: Not on file  . Alcohol Use: No   family history  includes Heart disease in his father and mother.  ROS as above Medications: Current Outpatient Prescriptions  Medication Sig Dispense Refill  . albuterol (PROVENTIL) (5 MG/ML) 0.5% nebulizer solution Take 0.5 mLs (2.5 mg total) by nebulization every 6 (six) hours as needed for wheezing or shortness of breath. 20 mL 1  . Albuterol Sulfate (PROAIR RESPICLICK) 123XX123 (90 Base) MCG/ACT AEPB Inhale 1 puff into the lungs every 6 (six) hours as needed (sob). 1 each 1  . aspirin 81 MG tablet Take 81 mg by mouth daily.    . carvedilol (COREG) 12.5 MG tablet Take 0.5 tablets (6.25 mg total) by mouth 2 (two) times daily with a meal.    . losartan (COZAAR) 50 MG tablet Take 50 mg by mouth daily.    Marland Kitchen spironolactone (ALDACTONE) 25 MG tablet Take 1 tablet (25 mg total) by mouth daily.    Marland Kitchen Umeclidinium-Vilanterol (ANORO ELLIPTA) 62.5-25 MCG/INH AEPB Inhale 1 puff into the lungs daily. 60 each 6  . warfarin (COUMADIN) 5 MG tablet Take 5 mg by mouth daily. At 6pm    . atorvastatin (LIPITOR) 80 MG tablet Take 1 tablet (80 mg total) by mouth daily at 6 PM. 30 tablet 3  . cefdinir (OMNICEF) 300 MG capsule Take 1 capsule (300 mg total) by mouth 2 (two) times daily. 14 capsule 0  . Fluticasone Furoate (ARNUITY ELLIPTA) 200 MCG/ACT AEPB Inhale 1 puff into the lungs daily. 30 each 12  . nitroGLYCERIN (NITROSTAT) 0.4 MG SL tablet Place 1 tablet (0.4  mg total) under the tongue every 5 (five) minutes x 3 doses as needed for chest pain. 25 tablet 3  . predniSONE (STERAPRED UNI-PAK 48 TAB) 10 MG (48) TBPK tablet Take by mouth daily. 12 day dosepack po 48 tablet 0  . [DISCONTINUED] amiodarone (PACERONE) 200 MG tablet Take 1 tablet (200 mg total) by mouth 2 (two) times daily. 60 tablet 3  . [DISCONTINUED] ramipril (ALTACE) 2.5 MG capsule Take 2.5 mg by mouth daily.      No current facility-administered medications for this visit.   Allergies  Allergen Reactions  . Ramipril     cough     Exam:  BP 92/67 mmHg  Pulse  82  Temp(Src) 97.6 F (36.4 C) (Oral)  Wt 316 lb (143.337 kg)  SpO2 94% Gen: Well NAD Nontoxic appearing HEENT: EOMI,  MMM Lungs: Normal work of breathing. Coarse breath sounds wheezing and prolonged expiratory phase present bilaterally Heart: RRR no MRG Abd: NABS, Soft. Nondistended, Nontender Exts: Brisk capillary refill, warm and well perfused.   Patient was given a 2.5/0.5 mg DuoNeb nebulizer treatment and felt better.     No results found for this or any previous visit (from the past 24 hour(s)). Dg Chest 2 View  03/20/2016  CLINICAL DATA:  Cough for 2-3 weeks.  COPD. EXAM: CHEST  2 VIEW COMPARISON:  07/20/2014 FINDINGS: Mild cardiomegaly. Chronic peribronchial thickening. No confluent opacities or effusions. No acute bony abnormality. IMPRESSION: Cardiomegaly. Chronic bronchitis, stable. Electronically Signed   By: Rolm Baptise M.D.   On: 03/20/2016 37:33     59 year old male with COPD exacerbation.  Prescribe a longer course of prednisone and switched to Omnicef antibiotics. Additionally continue albuterol nebulized at home up to 3 times daily. Additionally add inhaled steroid (Arnuity) daily.  Follow-up with PCP in the near future.

## 2016-03-23 DIAGNOSIS — I482 Chronic atrial fibrillation: Secondary | ICD-10-CM | POA: Diagnosis not present

## 2016-04-03 ENCOUNTER — Ambulatory Visit (INDEPENDENT_AMBULATORY_CARE_PROVIDER_SITE_OTHER): Payer: BLUE CROSS/BLUE SHIELD | Admitting: Family Medicine

## 2016-04-03 ENCOUNTER — Encounter: Payer: Self-pay | Admitting: Family Medicine

## 2016-04-03 VITALS — BP 135/88 | HR 83 | Wt 312.0 lb

## 2016-04-03 DIAGNOSIS — I482 Chronic atrial fibrillation, unspecified: Secondary | ICD-10-CM

## 2016-04-03 DIAGNOSIS — J4489 Other specified chronic obstructive pulmonary disease: Secondary | ICD-10-CM

## 2016-04-03 DIAGNOSIS — J449 Chronic obstructive pulmonary disease, unspecified: Secondary | ICD-10-CM | POA: Diagnosis not present

## 2016-04-03 DIAGNOSIS — I10 Essential (primary) hypertension: Secondary | ICD-10-CM | POA: Diagnosis not present

## 2016-04-03 MED ORDER — UMECLIDINIUM-VILANTEROL 62.5-25 MCG/INH IN AEPB: 1.0000 | INHALATION_SPRAY | Freq: Every day | RESPIRATORY_TRACT | Status: DC

## 2016-04-03 NOTE — Progress Notes (Signed)
CC: Jeffrey Larson is a 60 y.o. male is here for COPD   Subjective: HPI:  Follow-up essential hypertension: He is taking both carvedilol and losartan. No outside blood pressures to report. Denies chest pain shortness of breath orthopnea nor peripheral edema.  Follow-up atrial fibrillation: He is taking a beta blocker on a daily basis along with warfarin. His cardiologist is helping monitor his INR. He denies any bleeding or bruising abnormalities. He denies any irregular heartbeat or motor/sensory disturbances.  Follow-up COPD: He is using Arnuity and Anoro everyday with 100% compliance. He tells me that since starting the former he's had no breathing problems whatsoever. He denies cough, wheezing or chest congestion. He denies any shortness of breath.  Review Of Systems Outlined In HPI  Past Medical History  Diagnosis Date  . Coronary artery disease   . Myocardial infarction ([place])   . Afib ([place])   . History of pneumonia     RML on CXR 07/20/14  . Hyperlipidemia   . Renal disorder     history kidney stone    Past Surgical History  Procedure Laterality Date  . Coronary stent placement  7/12013  . Appendectomy    . Cardiovascular stress test  03/2014    Borderline reversible ischemic changes at the apex.  Normal LV contractility/EF 52%.  . Left heart catheterization with coronary angiogram N/A 06/12/2012    Procedure: LEFT HEART CATHETERIZATION WITH CORONARY ANGIOGRAM;  Surgeon: Clent Demark, MD;  Location[facility] C[facility] LAB;  Service: Cardiovascular;  Laterality: N/A;  . Percutaneous coronary stent intervention (pci-s) N/A 06/17/2012    Procedure: PERCUTANEOUS CORONARY STENT INTERVENTION (PCI-S);  Surgeon: Clent Demark, MD;  Location: [facility] CATH LAB;  Service: Cardiovascular;  Laterality: N/A;   Family History  Problem Relation Age of Onset  . Heart disease Mother   . Heart disease Father     Social History   Social History  . Marital Status: Married    Spouse Name: N/A  .  Number of Children: N/A  . Years of Education: N/A   Occupational History  . Not on file.   Social History Main Topics  . Smoking status: Former Smoker -- 0 years    Types: Cigarettes    Quit date: 11/26/1996  . Smokeless tobacco: Not on file  . Alcohol Use: No  . Drug Use: No  . Sexual Activity: Yes   Other Topics Concern  . Not on file   Social History Narrative     Objective: BP 135/88 mmHg  Pulse 83  Wt 312 lb (141.522 kg)  SpO2 96%  General: Alert and Oriented, No Acute Distress HEENT: Pupils equal, round, reactive to light. Conjunctivae clear.  Moist mucous membranes Lungs: Clear to auscultation bilaterally, no wheezing/ronchi/rales.  Comfortable work of breathing. Good air movement. Cardiac: Regular rate and rhythm. Normal S1/S2.  No murmurs, rubs, nor gallops.   Extremities: No peripheral edema.  Strong peripheral pulses.  Mental Status: No depression, anxiety, nor agitation. Skin: Warm and dry.  Assessment & Plan: Jeffrey Larson was seen today for copd.  Diagnoses and all orders for this visit:  Essential hypertension, benign  COPD with chronic bronchitis ([place]) -     umeclidinium-vilanterol (ANORO ELLIPTA) 62.5-25 MCG/INH AEPB; Inhale 1 puff into the lungs daily.  Chronic atrial fibrillation ([facility])   Essential hypertension: Controlled with losartan and carvedilol COPD: Controlled with Anoro and Arnuity Atrial fibrillation: Currently in normal sinus rhythm, continue current dose of carvedilol and follow-up with cardiology for INR monitoring.  Return in about 3 months (around 07/04/2016).

## 2016-04-25 DIAGNOSIS — I482 Chronic atrial fibrillation: Secondary | ICD-10-CM | POA: Diagnosis not present

## 2016-04-30 ENCOUNTER — Encounter: Payer: Self-pay | Admitting: Osteopathic Medicine

## 2016-04-30 ENCOUNTER — Ambulatory Visit (INDEPENDENT_AMBULATORY_CARE_PROVIDER_SITE_OTHER): Payer: BLUE CROSS/BLUE SHIELD | Admitting: Osteopathic Medicine

## 2016-04-30 VITALS — BP 120/79 | HR 70 | Ht 71.0 in | Wt 319.0 lb

## 2016-04-30 DIAGNOSIS — M545 Low back pain, unspecified: Secondary | ICD-10-CM | POA: Insufficient documentation

## 2016-04-30 DIAGNOSIS — R300 Dysuria: Secondary | ICD-10-CM | POA: Diagnosis not present

## 2016-04-30 DIAGNOSIS — Z5181 Encounter for therapeutic drug level monitoring: Secondary | ICD-10-CM | POA: Insufficient documentation

## 2016-04-30 DIAGNOSIS — R3129 Other microscopic hematuria: Secondary | ICD-10-CM | POA: Insufficient documentation

## 2016-04-30 LAB — CBC
HCT: 42.6 % (ref 38.5–50.0)
Hemoglobin: 14.5 g/dL (ref 13.2–17.1)
MCH: 30 pg (ref 27.0–33.0)
MCHC: 34 g/dL (ref 32.0–36.0)
MCV: 88 fL (ref 80.0–100.0)
MPV: 9.3 fL (ref 7.5–12.5)
Platelets: 237 10*3/uL (ref 140–400)
RBC: 4.84 MIL/uL (ref 4.20–5.80)
RDW: 15 % (ref 11.0–15.0)
WBC: 10.5 10*3/uL (ref 3.8–10.8)

## 2016-04-30 LAB — POCT URINALYSIS DIPSTICK
Bilirubin, UA: NEGATIVE
GLUCOSE UA: NEGATIVE
KETONES UA: NEGATIVE
LEUKOCYTES UA: NEGATIVE
Nitrite, UA: NEGATIVE
PH UA: 7
Spec Grav, UA: 1.025
UROBILINOGEN UA: 4

## 2016-04-30 LAB — COMPLETE METABOLIC PANEL WITHOUT GFR
ALT: 17 U/L (ref 9–46)
AST: 13 U/L (ref 10–35)
Albumin: 3.9 g/dL (ref 3.6–5.1)
Alkaline Phosphatase: 55 U/L (ref 40–115)
BUN: 16 mg/dL (ref 7–25)
CO2: 25 mmol/L (ref 20–31)
Calcium: 8.8 mg/dL (ref 8.6–10.3)
Chloride: 107 mmol/L (ref 98–110)
Creat: 1.11 mg/dL (ref 0.70–1.33)
GFR, Est African American: 84 mL/min
GFR, Est Non African American: 72 mL/min
Glucose, Bld: 100 mg/dL — ABNORMAL HIGH (ref 65–99)
Potassium: 4.4 mmol/L (ref 3.5–5.3)
Sodium: 143 mmol/L (ref 135–146)
Total Bilirubin: 0.7 mg/dL (ref 0.2–1.2)
Total Protein: 6.3 g/dL (ref 6.1–8.1)

## 2016-04-30 LAB — PROTIME-INR
INR: 1.55 — ABNORMAL HIGH
Prothrombin Time: 18.8 s — ABNORMAL HIGH (ref 11.6–15.2)

## 2016-04-30 MED ORDER — TRAMADOL HCL 50 MG PO TABS: 50.0000 mg | ORAL_TABLET | Freq: Three times a day (TID) | ORAL | Status: DC | PRN

## 2016-04-30 MED ORDER — CYCLOBENZAPRINE HCL 5 MG PO TABS: 5.0000 mg | ORAL_TABLET | Freq: Three times a day (TID) | ORAL | Status: DC | PRN

## 2016-04-30 MED ORDER — NAPROXEN-ESOMEPRAZOLE 500-20 MG PO TBEC: 1.0000 | DELAYED_RELEASE_TABLET | Freq: Two times a day (BID) | ORAL | Status: DC

## 2016-04-30 NOTE — Patient Instructions (Signed)

## 2016-04-30 NOTE — Progress Notes (Signed)
HPI: Jeffrey Larson is a 60 y.o. male who presents to [facility] today for chief complaint of:  Chief Complaint  Patient presents with  . Back Pain    BACK PAIN . Location: all the way across lower back.  . Quality: sore/tight pain  . Duration: month or two off and on . Timing: intermittent . Context: No injury . Modifying factors: No pain when just sitting there but worse when bending or twisting. Rx tried at home: Tylenol.  . Assoc signs/symptoms: No sciatica or numbness.    Past medical, social and family history reviewed: Past Medical History  Diagnosis Date  . Coronary artery disease   . Myocardial infarction ([place])   . Afib ([place])   . History of pneumonia     RML on CXR 07/20/14  . Hyperlipidemia   . Renal disorder     history kidney stone   Past Surgical History  Procedure Laterality Date  . Coronary stent placement  7/12013  . Appendectomy    . Cardiovascular stress test  03/2014    Borderline reversible ischemic changes at the apex.  Normal LV contractility/EF 52%.  . Left heart catheterization with coronary angiogram N/A 06/12/2012    Procedure: LEFT HEART CATHETERIZATION WITH CORONARY ANGIOGRAM;  Surgeon: Clent Demark, MD;  Location: [facility] CATH LAB;  Service: Cardiovascular;  Laterality: N/A;  . Percutaneous coronary stent intervention (pci-s) N/A 06/17/2012    Procedure: PERCUTANEOUS CORONARY STENT INTERVENTION (PCI-S);  Surgeon: Clent Demark, MD;  Location: [facility] CATH LAB;  Service: Cardiovascular;  Laterality: N/A;   Social History  Substance Use Topics  . Smoking status: Former Smoker -- 0 years    Types: Cigarettes    Quit date: 11/26/1996  . Smokeless tobacco: Not on file  . Alcohol Use: No   Family History  Problem Relation Age of Onset  . Heart disease Mother   . Heart disease Father     Current Outpatient Prescriptions  Medication Sig Dispense Refill  . albuterol (PROVENTIL) (5 MG/ML) 0.5% nebulizer solution Take  0.5 mLs (2.5 mg total) by nebulization every 6 (six) hours as needed for wheezing or shortness of breath. 20 mL 1  . Albuterol Sulfate (PROAIR RESPICLICK) 123XX123 (90 Base) MCG/ACT AEPB Inhale 1 puff into the lungs every 6 (six) hours as needed (sob). 1 each 1  . aspirin 81 MG tablet Take 81 mg by mouth daily.    . carvedilol (COREG) 12.5 MG tablet Take 0.5 tablets (6.25 mg total) by mouth 2 (two) times daily with a meal.    . Fluticasone Furoate (ARNUITY ELLIPTA) 200 MCG/ACT AEPB Inhale 1 puff into the lungs daily. 30 each 12  . losartan (COZAAR) 50 MG tablet Take 50 mg by mouth daily.    Marland Kitchen spironolactone (ALDACTONE) 25 MG tablet Take 1 tablet (25 mg total) by mouth daily.    Marland Kitchen umeclidinium-vilanterol (ANORO ELLIPTA) 62.5-25 MCG/INH AEPB Inhale 1 puff into the lungs daily. 60 each 6  . warfarin (COUMADIN) 5 MG tablet Take 5 mg by mouth daily. At 6pm    . atorvastatin (LIPITOR) 80 MG tablet Take 1 tablet (80 mg total) by mouth daily at 6 PM. 30 tablet 3  . nitroGLYCERIN (NITROSTAT) 0.4 MG SL tablet Place 1 tablet (0.4 mg total) under the tongue every 5 (five) minutes x 3 doses as needed for chest pain. 25 tablet 3  . [DISCONTINUED] amiodarone (PACERONE) 200 MG tablet Take 1 tablet (200 mg total) by mouth 2 (  two) times daily. 60 tablet 3  . [DISCONTINUED] ramipril (ALTACE) 2.5 MG capsule Take 2.5 mg by mouth daily.      No current facility-administered medications for this visit.   Allergies  Allergen Reactions  . Ramipril     cough      Review of Systems: CONSTITUTIONAL:  No  fever, no chills, No recent illness HEAD/EYES/EARS/NOSE/THROAT: No  headache, no vision change CARDIAC: No  chest pain GASTROINTESTINAL: No  nausea, No  vomiting, No  abdominal pain, No  blood in stool, No  diarrhea, No  constipation  MUSCULOSKELETAL: (+) back pain as per HPI, no other myalgia/arthralgia, no saddle anesthesia symptoms GENITOURINARY: No  incontinence, No  abnormal genital bleeding/discharge NEUROLOGIC:  No  weakness, No  dizziness, No  slurred speech   Exam:  BP 120/79 mmHg  Pulse 70  Ht 5\' 11"  (1.803 m)  Wt 319 lb (144.697 kg)  BMI 44.51 kg/m2 Constitutional: VS see above. General Appearance: alert, well-developed, well-nourished, NAD Eyes: Normal lids and conjunctive, non-icteric sclera Ears, Nose, Mouth, Throat: MMM, Normal external inspection ears/nares/mouth/lips/gums Neck: No masses, trachea midline.  Respiratory: Normal respiratory effort. no wheeze, no rhonchi, no rales Cardiovascular: S1/S2 normal, no murmur, no rub/gallop auscultated. RRR. No lower extremity edema. Musculoskeletal: Gait normal. No clubbing/cyanosis of digits. (+) perilumbar tenderness on R, neg straight leg raise bl, neg trendelenberg b/l Neurological: No cranial nerve deficit on limited exam. Motor and sensation intact and symmetric lower extremities Skin: warm, dry, intact. Psychiatric: Normal judgment/insight. Normal mood and affect. Oriented x3.    Results for orders placed or performed in visit on 04/30/16 (from the past 72 hour(s))  POCT Urinalysis Dipstick     Status: None   Collection Time: 04/30/16  3:16 PM  Result Value Ref Range   Color, UA YELLOW    Clarity, UA CLEAR    Glucose, UA NEGATIVE    Bilirubin, UA NEGATIVE    Ketones, UA NEGATIVE    Spec Grav, UA 1.025    Blood, UA TRACE    pH, UA 7.0    Protein, UA 30MG     Urobilinogen, UA 4.0    Nitrite, UA NEGATIVE    Leukocytes, UA Negative Negative      ASSESSMENT/PLAN:   Bilateral low back pain without sciatica - Caution w/ NSAID in pt on Coumadin - Vimovo Rx/sample provided, Tramadol to use sparingly, and Cyclobenzaprine  - Plan: POCT Urinalysis Dipstick, cyclobenzaprine (FLEXERIL) 5 MG tablet, traMADol (ULTRAM) 50 MG tablet, Ambulatory referral to Physical Therapy, Naproxen-Esomeprazole 500-20 MG TBEC, DISCONTINUED: Naproxen-Esomeprazole 500-20 MG TBEC  Medication monitoring encounter - Nothing in system several years in terms of  liver/kidney fxn, pt gets INR at cardiology monthly - get basic labs now for med safety - Plan: CBC, COMPLETE METABOLIC PANEL WITH GFR, INR/PT  Hematuria, microscopic - Plan: Urine Microscopic   All questions were answered. Visit summary with updated medication list and pertinent instructions was printed for patient. ER/RTC precautions were reviewed with the patient. Return in about 4 weeks (around 05/28/2016), or sooner if needed and as directed by PCP for routine care, for follow-up back pain if no better.

## 2016-05-01 LAB — URINALYSIS, MICROSCOPIC ONLY
Bacteria, UA: NONE SEEN [HPF]
CASTS: NONE SEEN [LPF]
Crystals: NONE SEEN [HPF]
Squamous Epithelial / LPF: NONE SEEN [HPF] (ref <=5)
WBC, UA: NONE SEEN WBC/HPF (ref <=5)
YEAST: NONE SEEN [HPF]

## 2016-05-03 DIAGNOSIS — I10 Essential (primary) hypertension: Secondary | ICD-10-CM | POA: Diagnosis not present

## 2016-05-03 DIAGNOSIS — I252 Old myocardial infarction: Secondary | ICD-10-CM | POA: Diagnosis not present

## 2016-05-03 DIAGNOSIS — I2510 Atherosclerotic heart disease of native coronary artery without angina pectoris: Secondary | ICD-10-CM | POA: Diagnosis not present

## 2016-05-03 DIAGNOSIS — I482 Chronic atrial fibrillation: Secondary | ICD-10-CM | POA: Diagnosis not present

## 2016-05-17 ENCOUNTER — Telehealth: Payer: Self-pay

## 2016-05-17 DIAGNOSIS — M545 Low back pain, unspecified: Secondary | ICD-10-CM

## 2016-05-17 MED ORDER — CYCLOBENZAPRINE HCL 5 MG PO TABS: 5.0000 mg | ORAL_TABLET | Freq: Three times a day (TID) | ORAL | Status: DC | PRN

## 2016-05-17 MED ORDER — TRAMADOL HCL 50 MG PO TABS: 50.0000 mg | ORAL_TABLET | Freq: Three times a day (TID) | ORAL | Status: DC | PRN

## 2016-05-17 NOTE — Telephone Encounter (Signed)
If still having pain, he needs to go to physical therapy, there should be a referral in place for this - I will do short-term refill of the medications but no additional refills unless seen for an appointment. He can pick up Rx at the front desk.

## 2016-05-17 NOTE — Telephone Encounter (Signed)
Patient advised and prescription faxed.  

## 2016-05-17 NOTE — Telephone Encounter (Signed)
Jeffrey Larson's wife called and states he still has a lot of back pain. He would like a refill on the tramadol and flexeril. Please advise.

## 2016-05-21 ENCOUNTER — Ambulatory Visit (INDEPENDENT_AMBULATORY_CARE_PROVIDER_SITE_OTHER): Payer: BLUE CROSS/BLUE SHIELD | Admitting: Family Medicine

## 2016-05-21 ENCOUNTER — Encounter: Payer: Self-pay | Admitting: Family Medicine

## 2016-05-21 VITALS — BP 94/68 | HR 83 | Wt 323.0 lb

## 2016-05-21 DIAGNOSIS — M545 Low back pain, unspecified: Secondary | ICD-10-CM

## 2016-05-21 MED ORDER — TRAMADOL HCL 50 MG PO TABS: 50.0000 mg | ORAL_TABLET | Freq: Three times a day (TID) | ORAL | Status: DC | PRN

## 2016-05-21 MED ORDER — CYCLOBENZAPRINE HCL 5 MG PO TABS: 5.0000 mg | ORAL_TABLET | Freq: Three times a day (TID) | ORAL | Status: DC | PRN

## 2016-05-21 NOTE — Progress Notes (Signed)
CC: Jeffrey Larson is a 60 y.o. male is here for Back Pain   Subjective: HPI:  Follow-up low back pain: Ever since May he's been experiencing some low back pain. When he was seen earlier this month it was bilateral however now is only on the left side. Pain is 1 out of 10 soon after he takes a muscle relaxer and tramadol however this wears off in about 12 hours. He is taking this regimen twice a day. Symptoms have slightly improved since he was seen here last but have plateaued. No interventions other than that described above. He has not engaged in any physical therapy yet. He denies any gastrointestinal complaints or genitourinary complaints. No overlying skin changes and denies skin pain. Without medication symptoms are moderate in severity. He denies any radiation of his pain nor any midline pain. No motor or sensory disturbances in the lower extremities. Denies any pain with bending over. Pain is most noticeable after sitting for long periods of time. He denies any recent trauma   Review Of Systems Outlined In HPI  Past Medical History  Diagnosis Date  . Coronary artery disease   . Myocardial infarction ([place])   . Afib ([place])   . History of pneumonia     RML on CXR 07/20/14  . Hyperlipidemia   . Renal disorder     history kidney stone    Past Surgical History  Procedure Laterality Date  . Coronary stent placement  7/12013  . Appendectomy    . Cardiovascular stress test  03/2014    Borderline reversible ischemic changes at the apex.  Normal LV contractility/EF 52%.  . Left heart catheterization with coronary angiogram N/A 06/12/2012    Procedure: LEFT HEART CATHETERIZATION WITH CORONARY ANGIOGRAM;  Surgeon: Clent Demark, MD;  Location: [facility] CATH LAB;  Service: Cardiovascular;  Laterality: N/A;  . Percutaneous coronary stent intervention (pci-s) N/A 06/17/2012    Procedure: PERCUTANEOUS CORONARY STENT INTERVENTION (PCI-S);  Surgeon: Clent Demark, MD;  Location: [facility] CATH LAB;  Service:  Cardiovascular;  Laterality: N/A;   Family History  Problem Relation Age of Onset  . Heart disease Mother   . Heart disease Father     Social History   Social History  . Marital Status: Married    Spouse Name: N/A  . Number of Children: N/A  . Years of Education: N/A   Occupational History  . Not on file.   Social History Main Topics  . Smoking status: Former Smoker -- 0 years    Types: Cigarettes    Quit date: 11/26/1996  . Smokeless tobacco: Not on file  . Alcohol Use: No  . Drug Use: No  . Sexual Activity: Yes   Other Topics Concern  . Not on file   Social History Narrative     Objective: BP 94/68 mmHg  Pulse 83  Wt 323 lb (146.512 kg)  Vital signs reviewed. General: Alert and Oriented, No Acute Distress HEENT: Pupils equal, round, reactive to light. Conjunctivae clear.  External ears unremarkable.  Moist mucous membranes. Lungs: Clear and comfortable work of breathing, speaking in full sentences without accessory muscle use. Cardiac: Regular rate and rhythm.  Neuro: CN II-XII grossly intact, gait normal. Extremities: No peripheral edema.  Strong peripheral pulses.  Back: No midline spinous process tenderness. No pain when palpating the left lower back just above the pelvic brim where his pain is localized. Full range of motion and strength in the planes of the lumbar spine Mental Status: No  depression, anxiety, nor agitation. Logical though process. Skin: Warm and dry.  Assessment & Plan: Jeffrey Larson was seen today for back pain.  Diagnoses and all orders for this visit:  Bilateral low back pain without sciatica Comments: Caution w/ NSAID in pt on Coumadin - Vimovo Rx/sample provided, Tramadol to use sparingly, and Cyclobenzaprine  Orders: -     traMADol (ULTRAM) 50 MG tablet; Take 1 tablet (50 mg total) by mouth every 8 (eight) hours as needed. -     cyclobenzaprine (FLEXERIL) 5 MG tablet; Take 1 tablet (5 mg total) by mouth 3 (three) times daily as needed  for muscle spasms.   Continue Flexeril and tramadol. I discussed with him that this is only masking the pain and I'm optimistic that if he concern a low back rehabilitation exercise regimen that he'll be able to fix this problem on his own. I've encouraged him to call me if not improving despite home physical therapy by the Fourth of July.  Return if symptoms worsen or fail to improve.

## 2016-05-24 DIAGNOSIS — I482 Chronic atrial fibrillation: Secondary | ICD-10-CM | POA: Diagnosis not present

## 2016-06-25 DIAGNOSIS — E785 Hyperlipidemia, unspecified: Secondary | ICD-10-CM | POA: Diagnosis not present

## 2016-06-25 DIAGNOSIS — I2510 Atherosclerotic heart disease of native coronary artery without angina pectoris: Secondary | ICD-10-CM | POA: Diagnosis not present

## 2016-06-25 DIAGNOSIS — I10 Essential (primary) hypertension: Secondary | ICD-10-CM | POA: Diagnosis not present

## 2016-06-25 DIAGNOSIS — I682 Cerebral arteritis in other diseases classified elsewhere: Secondary | ICD-10-CM | POA: Diagnosis not present

## 2016-07-04 ENCOUNTER — Ambulatory Visit: Payer: BLUE CROSS/BLUE SHIELD | Admitting: Family Medicine

## 2016-07-09 ENCOUNTER — Ambulatory Visit (INDEPENDENT_AMBULATORY_CARE_PROVIDER_SITE_OTHER): Payer: BLUE CROSS/BLUE SHIELD | Admitting: Family Medicine

## 2016-07-09 ENCOUNTER — Encounter: Payer: Self-pay | Admitting: Family Medicine

## 2016-07-09 VITALS — BP 135/87 | HR 80 | Wt 325.0 lb

## 2016-07-09 DIAGNOSIS — I10 Essential (primary) hypertension: Secondary | ICD-10-CM | POA: Diagnosis not present

## 2016-07-09 DIAGNOSIS — M25511 Pain in right shoulder: Secondary | ICD-10-CM | POA: Diagnosis not present

## 2016-07-09 DIAGNOSIS — J449 Chronic obstructive pulmonary disease, unspecified: Secondary | ICD-10-CM

## 2016-07-09 DIAGNOSIS — J4489 Other specified chronic obstructive pulmonary disease: Secondary | ICD-10-CM

## 2016-07-09 MED ORDER — TIOTROPIUM BROMIDE-OLODATEROL 2.5-2.5 MCG/ACT IN AERS: 2.0000 | INHALATION_SPRAY | Freq: Every day | RESPIRATORY_TRACT | 0 refills | Status: DC

## 2016-07-09 NOTE — Progress Notes (Signed)
CC: Jeffrey Larson is a 60 y.o. male is here for Hypertension and Shortness of Breath   Subjective: HPI:   Follow-up hypertension: Taking carvedilol and losartan on a daily basis with no outside blood pressures report. Denies chest pain,orthopnea nor peripheral edema.  Follow-up COPD: Shortness of breath occurring if he is exerting himself and outside. Symptoms are absent at home in the air conditioning when resting. He tells me he wakes up most mornings coughing up a lot of phlegm for the first few hours of the day. He doesn't believe that Anoro is helping much. He still gets some benefit from albuterol and arnuity. Denies blood in sputum. No chest discomfort  The past 3 weeks his expense of some right shoulder pain ever since lifting a heavy bucket multiple times with his right arm. It's painful in the shoulder if he lifts his arm beyond 90 of abduction. Pain is absent at rest. It's nonradiating. He denies any joint pain.   Review Of Systems Outlined In HPI  Past Medical History:  Diagnosis Date  . Afib ([place])   . Coronary artery disease   . History of pneumonia    RML on CXR 07/20/14  . Hyperlipidemia   . Myocardial infarction ([place])   . Renal disorder    history kidney stone    Past Surgical History:  Procedure Laterality Date  . APPENDECTOMY    . CARDIOVASCULAR STRESS TEST  03/2014   Borderline reversible ischemic changes at the apex.  Normal LV contractility/EF 52%.  . CORONARY STENT PLACEMENT  7/12013  . LEFT HEART CATHETERIZATION WITH CORONARY ANGIOGRAM N/A 06/12/2012   Procedure: LEFT HEART CATHETERIZATION WITH CORONARY ANGIOGRAM;  Surgeon: Clent Demark, MD;  Location: [facility] CATH LAB;  Service: Cardiovascular;  Laterality: N/A;  . PERCUTANEOUS CORONARY STENT INTERVENTION (PCI-S) N/A 06/17/2012   Procedure: PERCUTANEOUS CORONARY STENT INTERVENTION (PCI-S);  Surgeon: Clent Demark, MD;  Location: [facility] CATH LAB;  Service: Cardiovascular;  Laterality: N/A;   Family History   Problem Relation Age of Onset  . Heart disease Mother   . Heart disease Father     Social History   Social History  . Marital status: Married    Spouse name: N/A  . Number of children: N/A  . Years of education: N/A   Occupational History  . Not on file.   Social History Main Topics  . Smoking status: Former Smoker    Years: 0.00    Types: Cigarettes    Quit date: 11/26/1996  . Smokeless tobacco: Not on file  . Alcohol use No  . Drug use: No  . Sexual activity: Yes   Other Topics Concern  . Not on file   Social History Narrative  . No narrative on file     Objective: BP 135/87   Pulse 80   Wt (!) 325 lb (147.4 kg)   SpO2 93%   BMI 45.33 kg/m   General: Alert and Oriented, No Acute Distress HEENT: Pupils equal, round, reactive to light. Conjunctivae clear.  Moist mucous membranes Lungs: Clear to auscultation bilaterally, no wheezing/ronchi/rales.  Comfortable work of breathing. Good air movement. Cardiac: Regular rate and rhythm. Normal S1/S2.  No murmurs, rubs, nor gallops.   Right shoulder exam reveals full range of motion and strength in all planes of motion and with individual rotator cuff testing. No overlying redness warmth or swelling.  Neer's test positive.  Hawkins test negative. Empty can negative. Crossarm test negative. O'Brien's test negative. Apprehension test negative. Speed's test  negative. Extremities: No peripheral edema.  Strong peripheral pulses.  Mental Status: No depression, anxiety, nor agitation. Skin: Warm and dry.  Assessment & Plan: Caymon was seen today for hypertension and shortness of breath.  Diagnoses and all orders for this visit:  Essential hypertension, benign  COPD with chronic bronchitis ([place])  Right shoulder pain  Other orders -     Discontinue: Tiotropium Bromide-Olodaterol (STIOLTO RESPIMAT) 2.5-2.5 MCG/ACT AERS; Inhale 2 puffs into the lungs daily. -     Tiotropium Bromide-Olodaterol (STIOLTO RESPIMAT) 2.5-2.5  MCG/ACT AERS; Inhale 2 puffs into the lungs daily.   Essential hypertension: Controlled continue worse Artane and carvedilol COPD: Uncontrolled chronic condition switching from Anoro to [employer]. 2 samples were given call for formal prescription if desired Right shoulder pain: He was given home rehabilitation exercises to engage in for the next 3 weeks and if no better follow-up with Dr. Georgina Snell or T for sports med consult.  Discussed with this patient that I will be resigning from my position here with [facility] in September in order to stay with my family who will be moving to [facility]. I let him know about the providers that are still accepting patients and I feel that this individual will be under great care if he/she stays here with [facility].   Return in about 3 months (around 10/09/2016).

## 2016-07-17 ENCOUNTER — Telehealth: Payer: Self-pay

## 2016-07-17 MED ORDER — TIOTROPIUM BROMIDE-OLODATEROL 2.5-2.5 MCG/ACT IN AERS: 2.0000 | INHALATION_SPRAY | Freq: Every day | RESPIRATORY_TRACT | 2 refills | Status: DC

## 2016-07-17 NOTE — Telephone Encounter (Signed)
Refill sent.

## 2016-07-25 ENCOUNTER — Ambulatory Visit (INDEPENDENT_AMBULATORY_CARE_PROVIDER_SITE_OTHER): Payer: BLUE CROSS/BLUE SHIELD | Admitting: Family Medicine

## 2016-07-25 ENCOUNTER — Encounter: Payer: Self-pay | Admitting: Family Medicine

## 2016-07-25 VITALS — BP 145/75 | HR 54 | Wt 316.0 lb

## 2016-07-25 DIAGNOSIS — J449 Chronic obstructive pulmonary disease, unspecified: Secondary | ICD-10-CM

## 2016-07-25 MED ORDER — DOXYCYCLINE HYCLATE 100 MG PO TABS: ORAL_TABLET | ORAL | 0 refills | Status: AC

## 2016-07-25 MED ORDER — PREDNISONE 20 MG PO TABS: ORAL_TABLET | ORAL | 0 refills | Status: AC

## 2016-07-25 MED ORDER — METHYLPREDNISOLONE SODIUM SUCC 125 MG IJ SOLR
125.0000 mg | Freq: Once | INTRAMUSCULAR | Status: AC
  Administered 2016-07-25: 125 mg via INTRAMUSCULAR

## 2016-07-25 NOTE — Addendum Note (Signed)
Addended by: Delrae Alfred on: 07/25/2016 04:17 PM   Modules accepted: Orders

## 2016-07-25 NOTE — Progress Notes (Signed)
CC: Jeffrey Larson is a 60 y.o. male is here for COPD   Subjective: HPI:  For the past 2 weeks he's felt that he's having difficulty taking a deep breath in. He's also noticed that his wheezing has worsened. He is getting more easily short of breath with activity but no shortness of breath with rest. He started taking leftover ciprofloxacin over the weekend and feels like maybe symptoms got a little bit better but he ran out of the medication a few days ago and it's getting worse.  Albuterol helps for a few hours. He denies fevers, chills but has had a worsening cough with sputum production but no blood in sputum. He denies any orthopnea nor peripheral edema. He denies any chest pain   Review Of Systems Outlined In HPI  Past Medical History:  Diagnosis Date  . Afib ([place])   . Coronary artery disease   . History of pneumonia    RML on CXR 07/20/14  . Hyperlipidemia   . Myocardial infarction ([place])   . Renal disorder    history kidney stone    Past Surgical History:  Procedure Laterality Date  . APPENDECTOMY    . CARDIOVASCULAR STRESS TEST  03/2014   Borderline reversible ischemic changes at the apex.  Normal LV contractility/EF 52%.  . CORONARY STENT PLACEMENT  7/12013  . LEFT HEART CATHETERIZATION WITH CORONARY ANGIOGRAM N/A 06/12/2012   Procedure: LEFT HEART CATHETERIZATION WITH CORONARY ANGIOGRAM;  Surgeon: Clent Demark, MD;  Location: [facility] CATH LAB;  Service: Cardiovascular;  Laterality: N/A;  . PERCUTANEOUS CORONARY STENT INTERVENTION (PCI-S) N/A 06/17/2012   Procedure: PERCUTANEOUS CORONARY STENT INTERVENTION (PCI-S);  Surgeon: Clent Demark, MD;  Location: [facility] CATH LAB;  Service: Cardiovascular;  Laterality: N/A;   Family History  Problem Relation Age of Onset  . Heart disease Mother   . Heart disease Father     Social History   Social History  . Marital status: Married    Spouse name: N/A  . Number of children: N/A  . Years of education: N/A   Occupational History  .  Not on file.   Social History Main Topics  . Smoking status: Former Smoker    Years: 0.00    Types: Cigarettes    Quit date: 11/26/1996  . Smokeless tobacco: Not on file  . Alcohol use No  . Drug use: No  . Sexual activity: Yes   Other Topics Concern  . Not on file   Social History Narrative  . No narrative on file     Objective: BP (!) 145/75   Pulse (!) 54   Wt (!) 316 lb (143.3 kg)   SpO2 92%   BMI 44.07 kg/m   General: Alert and Oriented, No Acute Distress HEENT: Pupils equal, round, reactive to light. Conjunctivae clear.  External ears unremarkable, canals clear with intact TMs with appropriate landmarks.  Middle ear appears open without effusion. Pink inferior turbinates.  Moist mucous membranes, pharynx without inflammation nor lesions.  Neck supple without palpable lymphadenopathy nor abnormal masses. Lungs: Comfortable work of breathing with expiratory wheezing in all lung fields with no rhonchi or rales. Cardiac: Regularly irregular rhythm less than 100 bpm. No murmur Extremities: No peripheral edema.  Strong peripheral pulses.  Mental Status: No depression, anxiety, nor agitation. Skin: Warm and dry.  Assessment & Plan: Vaden was seen today for copd.  Diagnoses and all orders for this visit:  COPD with chronic bronchitis ([place])   COPD exacerbation: Start prednisone taper  and doxycycline. Referral to pulmonology given the frequency of his exacerbations. Solu-Medrol 125 mg IM today. Signs and symptoms requring emergent/urgent reevaluation were discussed with the patient.  No Follow-up on file.

## 2016-07-26 ENCOUNTER — Other Ambulatory Visit: Payer: Self-pay

## 2016-07-26 MED ORDER — ALBUTEROL SULFATE (5 MG/ML) 0.5% IN NEBU: 2.5000 mg | INHALATION_SOLUTION | Freq: Four times a day (QID) | RESPIRATORY_TRACT | 1 refills | Status: DC | PRN

## 2016-08-02 DIAGNOSIS — I482 Chronic atrial fibrillation: Secondary | ICD-10-CM | POA: Diagnosis not present

## 2016-08-02 DIAGNOSIS — R0600 Dyspnea, unspecified: Secondary | ICD-10-CM | POA: Diagnosis not present

## 2016-08-02 DIAGNOSIS — I252 Old myocardial infarction: Secondary | ICD-10-CM | POA: Diagnosis not present

## 2016-08-02 DIAGNOSIS — I2510 Atherosclerotic heart disease of native coronary artery without angina pectoris: Secondary | ICD-10-CM | POA: Diagnosis not present

## 2016-09-22 ENCOUNTER — Emergency Department
Admission: EM | Admit: 2016-09-22 | Discharge: 2016-09-22 | Disposition: A | Discharge status: Home / Self Care | Payer: BLUE CROSS/BLUE SHIELD | Source: Home / Self Care | Attending: Family Medicine | Admitting: Family Medicine

## 2016-09-22 ENCOUNTER — Encounter: Payer: Self-pay | Admitting: Emergency Medicine

## 2016-09-22 DIAGNOSIS — J069 Acute upper respiratory infection, unspecified: Secondary | ICD-10-CM | POA: Diagnosis not present

## 2016-09-22 DIAGNOSIS — J441 Chronic obstructive pulmonary disease with (acute) exacerbation: Secondary | ICD-10-CM

## 2016-09-22 DIAGNOSIS — B9789 Other viral agents as the cause of diseases classified elsewhere: Secondary | ICD-10-CM

## 2016-09-22 MED ORDER — PREDNISONE 20 MG PO TABS: ORAL_TABLET | ORAL | 0 refills | Status: DC

## 2016-09-22 MED ORDER — METHYLPREDNISOLONE SODIUM SUCC 125 MG IJ SOLR
125.0000 mg | Freq: Once | INTRAMUSCULAR | Status: AC
  Administered 2016-09-22: 125 mg via INTRAMUSCULAR

## 2016-09-22 MED ORDER — IPRATROPIUM-ALBUTEROL 0.5-2.5 (3) MG/3ML IN SOLN
3.0000 mL | Freq: Once | RESPIRATORY_TRACT | Status: AC
  Administered 2016-09-22: 3 mL via RESPIRATORY_TRACT

## 2016-09-22 MED ORDER — DOXYCYCLINE HYCLATE 100 MG PO CAPS: 100.0000 mg | ORAL_CAPSULE | Freq: Two times a day (BID) | ORAL | 0 refills | Status: DC

## 2016-09-22 MED ORDER — GUAIFENESIN-CODEINE 100-10 MG/5ML PO SOLN: ORAL | 0 refills | Status: DC

## 2016-09-22 NOTE — Discharge Instructions (Signed)
Begin prednisone Sunday 09/23/16. Take plain guaifenesin (1200mg  extended release tabs such as Mucinex) twice daily, with plenty of water, for cough and congestion.  Get adequate rest.   May use Afrin nasal spray (or generic oxymetazoline) twice daily for about 5 days and then discontinue.  Also recommend using saline nasal spray several times daily and saline nasal irrigation (AYR is a common brand).  Use Flonase nasal spray each morning after using Afrin nasal spray and saline nasal irrigation. Try warm salt water gargles for sore throat.  Stop all antihistamines for now, and other non-prescription cough/cold preparations. Continue all inhalers as prescribed. Follow-up with family doctor if not improving about 7 to10 days.

## 2016-09-22 NOTE — ED Provider Notes (Signed)
[facility]    CSN: [id number] Arrival date & time: 09/22/16  1057     History   Chief Complaint Chief Complaint  Patient presents with  . Shortness of Breath    HPI Jeffrey Larson is a 60 y.o. male.   Patient complains of onset of chest congestion, productive cough, and shortness of breath with activity last night.  No sore throat, and minimal nasal congestion.  He used his nebulizer at 7am today.    He has a history of COPD, and pneumonia about 1.5 years ago.   The history is provided by the patient.    Past Medical History:  Diagnosis Date  . Afib ([place])   . Coronary artery disease   . History of pneumonia    RML on CXR 07/20/14  . Hyperlipidemia   . Myocardial infarction   . Renal disorder    history kidney stone    Patient Active Problem List   Diagnosis Date Noted  . Right shoulder pain 07/09/2016  . Bilateral low back pain without sciatica 04/30/2016  . Medication monitoring encounter 04/30/2016  . Hematuria, microscopic 04/30/2016  . COPD exacerbation ([place]) 12/22/2015  . COPD with chronic bronchitis ([place]) 09/08/2014  . Mitral valve stenosis 08/12/2014  . History of MI (myocardial infarction) 08/04/2014  . Atrial fibrillation ([place]) 08/04/2014  . Hyperlipidemia 08/04/2014  . Essential hypertension, benign 08/04/2014    Past Surgical History:  Procedure Laterality Date  . APPENDECTOMY    . CARDIOVASCULAR STRESS TEST  03/2014   Borderline reversible ischemic changes at the apex.  Normal LV contractility/EF 52%.  . CORONARY STENT PLACEMENT  7/12013  . LEFT HEART CATHETERIZATION WITH CORONARY ANGIOGRAM N/A 06/12/2012   Procedure: LEFT HEART CATHETERIZATION WITH CORONARY ANGIOGRAM;  Surgeon: Clent Demark, MD;  Location: [facility] CATH LAB;  Service: Cardiovascular;  Laterality: N/A;  . PERCUTANEOUS CORONARY STENT INTERVENTION (PCI-S) N/A 06/17/2012   Procedure: PERCUTANEOUS CORONARY STENT INTERVENTION (PCI-S);  Surgeon: Clent Demark, MD;   Location: [facility] CATH LAB;  Service: Cardiovascular;  Laterality: N/A;       Home Medications    Prior to Admission medications   Medication Sig Start Date End Date Taking? Authorizing Provider  albuterol (PROVENTIL) (5 MG/ML) 0.5% nebulizer solution Take 0.5 mLs (2.5 mg total) by nebulization every 6 (six) hours as needed for wheezing or shortness of breath. 07/26/16   Silverio Decamp, MD  Albuterol Sulfate (PROAIR RESPICLICK) [id number] (90 Base) MCG/ACT AEPB Inhale 1 puff into the lungs every 6 (six) hours as needed (sob). 12/22/15   Gregor Hams, MD  aspirin 81 MG tablet Take 81 mg by mouth daily.    Historical Provider, MD  atorvastatin (LIPITOR) 80 MG tablet Take 1 tablet (80 mg total) by mouth daily at 6 PM. 06/18/12 07/20/14  [name], MD  carvedilol (COREG) 12.5 MG tablet Take 0.5 tablets (6.25 mg total) by mouth 2 (two) times daily with a meal. 10/11/14   Sean Hommel, DO  cyclobenzaprine (FLEXERIL) 5 MG tablet Take 1 tablet (5 mg total) by mouth 3 (three) times daily as needed for muscle spasms. 05/21/16   Marcial Pacas, DO  doxycycline (VIBRAMYCIN) 100 MG capsule Take 1 capsule (100 mg total) by mouth 2 (two) times daily. Take with food. 09/22/16   Kandra Nicolas, MD  Fluticasone Furoate (ARNUITY ELLIPTA) 200 MCG/ACT AEPB Inhale 1 puff into the lungs daily. 03/20/16   Gregor Hams, MD  guaiFENesin-codeine 100-10 MG/5ML syrup Take 59mL by mouth at  bedtime as needed for cough 09/22/16   Kandra Nicolas, MD  losartan (COZAAR) 50 MG tablet Take 50 mg by mouth daily.    Historical Provider, MD  Naproxen-Esomeprazole 500-20 MG TBEC Take 1 tablet by mouth 2 (two) times daily. 04/30/16   [name], DO  nitroGLYCERIN (NITROSTAT) 0.4 MG SL tablet Place 1 tablet (0.4 mg total) under the tongue every 5 (five) minutes x 3 doses as needed for chest pain. 06/18/12 07/20/14  [name], MD  predniSONE (DELTASONE) 20 MG tablet Take one tab by mouth twice daily for 5 days, then one daily for 3 days.  Take with food. 09/22/16   Kandra Nicolas, MD  spironolactone (ALDACTONE) 25 MG tablet Take 1 tablet (25 mg total) by mouth daily. 08/12/14   Marcial Pacas, DO  Tiotropium Bromide-Olodaterol (STIOLTO RESPIMAT) 2.5-2.5 MCG/ACT AERS Inhale 2 puffs into the lungs daily. 07/17/16   Marcial Pacas, DO  traMADol (ULTRAM) 50 MG tablet Take 1 tablet (50 mg total) by mouth every 8 (eight) hours as needed. 05/21/16   Marcial Pacas, DO  warfarin (COUMADIN) 5 MG tablet Take 5 mg by mouth daily. At 6pm    Historical Provider, MD    Family History Family History  Problem Relation Age of Onset  . Heart disease Mother   . Heart disease Father     Social History Social History  Substance Use Topics  . Smoking status: Former Smoker    Years: 0.00    Types: Cigarettes    Quit date: 11/26/1996  . Smokeless tobacco: Never Used  . Alcohol use No     Allergies   Ramipril   Review of Systems Review of Systems  No sore throat + cough No pleuritic pain ? wheezing ? nasal congestion No post-nasal drainage No sinus pain/pressure No itchy/red eyes No earache No hemoptysis + SOB No fever/chills No nausea No vomiting No abdominal pain No diarrhea No urinary symptoms No skin rash + fatigue No myalgias No headache     Physical Exam Triage Vital Signs ED Triage Vitals  Enc Vitals Group     BP 09/22/16 1110 110/78     Pulse Rate 09/22/16 1110 78     Resp --      Temp 09/22/16 1110 97.7 F (36.5 C)     Temp Source 09/22/16 1110 Oral     SpO2 09/22/16 1110 96 %     Weight 09/22/16 1110 300 lb (136.1 kg)     Height 09/22/16 1110 5\' 11"  (1.803 m)     Head Circumference --      Peak Flow --      Pain Score 09/22/16 1111 0     Pain Loc --      Pain Edu? --      Excl. in [place]? --    No data found.   Updated Vital Signs BP 110/78 (BP Location: Left Arm)   Pulse 78   Temp 97.7 F (36.5 C) (Oral)   Ht 5\' 11"  (1.803 m)   Wt 300 lb (136.1 kg)   SpO2 96%   BMI 41.84 kg/m   Visual  Acuity Right Eye Distance:   Left Eye Distance:   Bilateral Distance:    Right Eye Near:   Left Eye Near:    Bilateral Near:     Physical Exam Nursing notes and Vital Signs reviewed. Appearance:  Patient appears stated age, and in no acute distress Eyes:  Pupils are equal, round, and reactive to light  and accomodation.  Extraocular movement is intact.  Conjunctivae are not inflamed  Ears:  Canals normal.  Tympanic membranes normal.  Nose:  Mildly congested turbinates.  No sinus tenderness.   Pharynx:  Normal Neck:  Supple.  Tender enlarged posterior/lateral nodes are palpated bilaterally  Lungs:   Scattered wheezes.  Breath sounds are equal.  Moving air well. Heart:   Irregularly irregular rhythm without murmurs, rubs, or gallops.  Abdomen:  Nontender without masses or hepatosplenomegaly.  Bowel sounds are present.  No CVA or flank tenderness.  Extremities:  No edema.  Skin:  No rash present.    UC Treatments / Results  Labs (all labs ordered are listed, but only abnormal results are displayed) Labs Reviewed - No data to display  EKG  EKG Interpretation None       Radiology No results found.  Procedures Procedures (including critical care time)  Medications Ordered in UC Medications  ipratropium-albuterol (DUONEB) 0.5-2.5 (3) MG/3ML nebulizer solution 3 mL (3 mLs Nebulization Given 09/22/16 1117)  methylPREDNISolone sodium succinate (SOLU-MEDROL) 125 mg/2 mL injection 125 mg (125 mg Intramuscular Given 09/22/16 1117)     Initial Impression / Assessment and Plan / UC Course  I have reviewed the triage vital signs and the nursing notes.  Pertinent labs & imaging results that were available during my care of the patient were reviewed by me and considered in my medical decision making (see chart for details).  Clinical Course  Administered Depo Medrol 80mg  IM  Administered Solumedrol 125mg  IM  Begin doxycycline 100mg  BID for atypical coverage. Rx for Robitussin AC  for night time cough.  Begin prednisone Sunday 09/23/16. Take plain guaifenesin (1200mg  extended release tabs such as Mucinex) twice daily, with plenty of water, for cough and congestion.  Get adequate rest.   May use Afrin nasal spray (or generic oxymetazoline) twice daily for about 5 days and then discontinue.  Also recommend using saline nasal spray several times daily and saline nasal irrigation (AYR is a common brand).  Use Flonase nasal spray each morning after using Afrin nasal spray and saline nasal irrigation. Try warm salt water gargles for sore throat.  Stop all antihistamines for now, and other non-prescription cough/cold preparations. Continue all inhalers as prescribed. Follow-up with family doctor if not improving about 7 to10 days.     Final Clinical Impressions(s) / UC Diagnoses   Final diagnoses:  Viral URI with cough  COPD exacerbation (HCC)    New Prescriptions New Prescriptions   DOXYCYCLINE (VIBRAMYCIN) 100 MG CAPSULE    Take 1 capsule (100 mg total) by mouth 2 (two) times daily. Take with food.   GUAIFENESIN-CODEINE 100-10 MG/5ML SYRUP    Take 83mL by mouth at bedtime as needed for cough   PREDNISONE (DELTASONE) 20 MG TABLET    Take one tab by mouth twice daily for 5 days, then one daily for 3 days. Take with food.     Kandra Nicolas, MD 09/30/16 [phone]

## 2016-09-22 NOTE — ED Triage Notes (Signed)
Pt c/o SOB, chest congestion, productive cough which started last night.  Albuterol nebulizer done at home.

## 2016-09-25 DIAGNOSIS — I482 Chronic atrial fibrillation: Secondary | ICD-10-CM | POA: Diagnosis not present

## 2016-10-09 ENCOUNTER — Ambulatory Visit (INDEPENDENT_AMBULATORY_CARE_PROVIDER_SITE_OTHER): Payer: BLUE CROSS/BLUE SHIELD | Admitting: Family Medicine

## 2016-10-09 ENCOUNTER — Ambulatory Visit: Payer: BLUE CROSS/BLUE SHIELD | Admitting: Osteopathic Medicine

## 2016-10-09 VITALS — BP 115/88 | HR 77 | Temp 98.1°F | Wt 319.0 lb

## 2016-10-09 DIAGNOSIS — J441 Chronic obstructive pulmonary disease with (acute) exacerbation: Secondary | ICD-10-CM | POA: Diagnosis not present

## 2016-10-09 DIAGNOSIS — J040 Acute laryngitis: Secondary | ICD-10-CM | POA: Diagnosis not present

## 2016-10-09 MED ORDER — PREDNISONE 10 MG PO TABS: 30.0000 mg | ORAL_TABLET | Freq: Every day | ORAL | 0 refills | Status: DC

## 2016-10-09 MED ORDER — AMOXICILLIN 500 MG PO CAPS: 500.0000 mg | ORAL_CAPSULE | Freq: Three times a day (TID) | ORAL | 0 refills | Status: DC

## 2016-10-09 NOTE — Patient Instructions (Signed)
Thank you for coming in today. Use prednisone daily.  If not better take amoxicillin antibiotic.  Continue albuterol.  Make a follow up appointment with Dr. Sheppard Coil soon.  Call or go to the emergency room if you get worse, have trouble breathing, have chest pains, or palpitations.    Chronic Obstructive Pulmonary Disease Exacerbation Chronic obstructive pulmonary disease (COPD) is a common lung problem. In COPD, the flow of air from the lungs is limited. COPD exacerbations are times that breathing gets worse and you need extra treatment. Without treatment they can be life threatening. If they happen often, your lungs can become more damaged. If your COPD gets worse, your doctor may treat you with:  Medicines.  Oxygen.  Different ways to clear your airway, such as using a mask. Follow these instructions at home:  Do not smoke.  Avoid tobacco smoke and other things that bother your lungs.  If given, take your antibiotic medicine as told. Finish the medicine even if you start to feel better.  Only take medicines as told by your doctor.  Drink enough fluids to keep your pee (urine) clear or pale yellow (unless your doctor has told you not to).  Use a cool mist machine (vaporizer).  If you use oxygen or a machine that turns liquid medicine into a mist (nebulizer), continue to use them as told.  Keep up with shots (vaccinations) as told by your doctor.  Exercise regularly.  Eat healthy foods.  Keep all doctor visits as told. Get help right away if:  You are very short of breath and it gets worse.  You have trouble talking.  You have bad chest pain.  You have blood in your spit (sputum).  You have a fever.  You keep throwing up (vomiting).  You feel weak, or you pass out (faint).  You feel confused.  You keep getting worse. This information is not intended to replace advice given to you by your health care provider. Make sure you discuss any questions you have  with your health care provider. Document Released: 11/01/2011 Document Revised: 04/19/2016 Document Reviewed: 07/17/2013 Elsevier Interactive Patient Education  2017 [employer].

## 2016-10-09 NOTE — Progress Notes (Signed)
Jeffrey Larson is a 60 y.o. male who presents to [facility] today for hoarse voice cough and congestion. Patient was seen in urgent care a few weeks ago for a wrist or infection and treated with doxycycline. He notices helped some however his symptoms worsen again. He has hoarse voice and nasal congestion along with cough. He denies significant shortness of breath chest pain or palpitations. He is worried that symptoms will turn into a COPD exacerbation which he has had before. His symptoms currently are not consistent with previous episodes of pneumonia.   Past Medical History:  Diagnosis Date  . Afib ([place])   . Coronary artery disease   . History of pneumonia    RML on CXR 07/20/14  . Hyperlipidemia   . Myocardial infarction   . Renal disorder    history kidney stone   Past Surgical History:  Procedure Laterality Date  . APPENDECTOMY    . CARDIOVASCULAR STRESS TEST  03/2014   Borderline reversible ischemic changes at the apex.  Normal LV contractility/EF 52%.  . CORONARY STENT PLACEMENT  7/12013  . LEFT HEART CATHETERIZATION WITH CORONARY ANGIOGRAM N/A 06/12/2012   Procedure: LEFT HEART CATHETERIZATION WITH CORONARY ANGIOGRAM;  Surgeon: Clent Demark, MD;  Location: [facility] CATH LAB;  Service: Cardiovascular;  Laterality: N/A;  . PERCUTANEOUS CORONARY STENT INTERVENTION (PCI-S) N/A 06/17/2012   Procedure: PERCUTANEOUS CORONARY STENT INTERVENTION (PCI-S);  Surgeon: Clent Demark, MD;  Location: [facility] CATH LAB;  Service: Cardiovascular;  Laterality: N/A;   Social History  Substance Use Topics  . Smoking status: Former Smoker    Years: 0.00    Types: Cigarettes    Quit date: 11/26/1996  . Smokeless tobacco: Never Used  . Alcohol use No   family history includes Heart disease in his father and mother.  ROS as above:  Medications: Current Outpatient Prescriptions    Medication Sig Dispense Refill  . albuterol (PROVENTIL) (5 MG/ML) 0.5% nebulizer solution Take 0.5 mLs (2.5 mg total) by nebulization every 6 (six) hours as needed for wheezing or shortness of breath. 20 mL 1  . Albuterol Sulfate (PROAIR RESPICLICK) 123XX123 (90 Base) MCG/ACT AEPB Inhale 1 puff into the lungs every 6 (six) hours as needed (sob). 1 each 1  . aspirin 81 MG tablet Take 81 mg by mouth daily.    . carvedilol (COREG) 12.5 MG tablet Take 0.5 tablets (6.25 mg total) by mouth 2 (two) times daily with a meal.    . cyclobenzaprine (FLEXERIL) 5 MG tablet Take 1 tablet (5 mg total) by mouth 3 (three) times daily as needed for muscle spasms. 30 tablet 1  . Fluticasone Furoate (ARNUITY ELLIPTA) 200 MCG/ACT AEPB Inhale 1 puff into the lungs daily. 30 each 12  . losartan (COZAAR) 50 MG tablet Take 50 mg by mouth daily.    . Naproxen-Esomeprazole 500-20 MG TBEC Take 1 tablet by mouth 2 (two) times daily. 60 tablet 0  . spironolactone (ALDACTONE) 25 MG tablet Take 1 tablet (25 mg total) by mouth daily.    . Tiotropium Bromide-Olodaterol (STIOLTO RESPIMAT) 2.5-2.5 MCG/ACT AERS Inhale 2 puffs into the lungs daily. 2 Inhaler 2  . traMADol (ULTRAM) 50 MG tablet Take 1 tablet (50 mg total) by mouth every 8 (eight) hours as needed. 30 tablet 1  . warfarin (COUMADIN) 5 MG tablet Take 5 mg by mouth daily. At 6pm    . amoxicillin (AMOXIL) 500 MG capsule Take 1 capsule (  500 mg total) by mouth 3 (three) times daily. 30 capsule 0  . atorvastatin (LIPITOR) 80 MG tablet Take 1 tablet (80 mg total) by mouth daily at 6 PM. 30 tablet 3  . nitroGLYCERIN (NITROSTAT) 0.4 MG SL tablet Place 1 tablet (0.4 mg total) under the tongue every 5 (five) minutes x 3 doses as needed for chest pain. 25 tablet 3  . predniSONE (DELTASONE) 10 MG tablet Take 3 tablets (30 mg total) by mouth daily with breakfast. 15 tablet 0   No current facility-administered medications for this visit.    Allergies  Allergen Reactions  . Ramipril      cough    Health Maintenance Health Maintenance  Topic Date Due  . Hepatitis C Screening  05-27-1956  . HIV Screening  09/11/1971  . COLONOSCOPY  09/10/2006  . INFLUENZA VACCINE  06/26/2016  . ZOSTAVAX  09/10/2016  . TETANUS/TDAP  07/19/2025     Exam:  BP 115/88   Pulse 77   Temp 98.1 F (36.7 C) (Oral)   Wt (!) 319 lb (144.7 kg)   SpO2 96%   BMI 44.49 kg/m  Gen: Well NAD Nontoxic appearing HEENT: EOMI,  MMM Lungs: Normal work of breathing. Wheezing present bilaterally with mild prolonged expiratory phase Heart: RRR no MRG Abd: NABS, Soft. Nondistended, Nontender Exts: Brisk capillary refill, warm and well perfused.    No results found for this or any previous visit (from the past 72 hour(s)). No results found.    Assessment and Plan: 60 y.o. male with laryngitis versus COPD exacerbation. Patient is afebrile with normal oxygen saturation. Very doubtful for pneumonia. Treat empirically with prednisone albuterol and use amoxicillin as backup antibiotics as patient has recently been treated with doxycycline. Return/follow-up with PCP in the near future.   No orders of the defined types were placed in this encounter.   Discussed warning signs or symptoms. Please see discharge instructions. Patient expresses understanding.

## 2016-10-24 DIAGNOSIS — I482 Chronic atrial fibrillation: Secondary | ICD-10-CM | POA: Diagnosis not present

## 2016-11-01 ENCOUNTER — Ambulatory Visit: Payer: BLUE CROSS/BLUE SHIELD | Admitting: Osteopathic Medicine

## 2016-11-06 ENCOUNTER — Ambulatory Visit (INDEPENDENT_AMBULATORY_CARE_PROVIDER_SITE_OTHER): Payer: BLUE CROSS/BLUE SHIELD

## 2016-11-06 ENCOUNTER — Encounter: Payer: Self-pay | Admitting: Osteopathic Medicine

## 2016-11-06 ENCOUNTER — Ambulatory Visit (INDEPENDENT_AMBULATORY_CARE_PROVIDER_SITE_OTHER): Payer: BLUE CROSS/BLUE SHIELD | Admitting: Osteopathic Medicine

## 2016-11-06 VITALS — BP 124/80 | HR 86 | Ht 71.0 in | Wt 321.0 lb

## 2016-11-06 DIAGNOSIS — E785 Hyperlipidemia, unspecified: Secondary | ICD-10-CM

## 2016-11-06 DIAGNOSIS — R059 Cough, unspecified: Secondary | ICD-10-CM

## 2016-11-06 DIAGNOSIS — I517 Cardiomegaly: Secondary | ICD-10-CM

## 2016-11-06 DIAGNOSIS — I482 Chronic atrial fibrillation, unspecified: Secondary | ICD-10-CM

## 2016-11-06 DIAGNOSIS — J449 Chronic obstructive pulmonary disease, unspecified: Secondary | ICD-10-CM | POA: Diagnosis not present

## 2016-11-06 DIAGNOSIS — I10 Essential (primary) hypertension: Secondary | ICD-10-CM | POA: Diagnosis not present

## 2016-11-06 DIAGNOSIS — R05 Cough: Secondary | ICD-10-CM

## 2016-11-06 DIAGNOSIS — I252 Old myocardial infarction: Secondary | ICD-10-CM

## 2016-11-06 MED ORDER — TIOTROPIUM BROMIDE-OLODATEROL 2.5-2.5 MCG/ACT IN AERS: 2.0000 | INHALATION_SPRAY | Freq: Every day | RESPIRATORY_TRACT | 2 refills | Status: DC

## 2016-11-06 NOTE — Progress Notes (Signed)
HPI: Jeffrey Larson is a 60 y.o. male  who presents to [facility] today, 11/06/16,  for chief complaint of:  Chief Complaint  Patient presents with  . Other    TRANSFER FROM [facility]     Essential hypertension/CAD: Patient is on carvedilol, spironolactone, losartan. Nitro as needed. Blood pressure controlled, no chest pain, pressure, shortness of breath.  Afib: On Coumadin, INR is being followed by cardiology  COPD: Patient is on albuterol, has nebulizer and inhaler, Flonase, Tiotropium-Olodaterol refill needed. Denies shortness of breath. Reports some nagging cough and occasional mucus production but no fever or significant shortness of breath. Thinks may be persistent colds lingering from last time she was here, antibiotics don't seem to have resolved the issue. Was previously referred to pulmonology for second opinion/further evaluation. Referral placed in August 2017, patient states that he called the pulmonary office multiple times but was unable to get through to schedule an appointment. States overall he is breathing much better over the past few months, isn't too worried about referral at this point but is a little bit concerned about the chronic cough/mucus production.   Past medical, surgical, social and family history reviewed: Patient Active Problem List   Diagnosis Date Noted  . Right shoulder pain 07/09/2016  . Bilateral low back pain without sciatica 04/30/2016  . Medication monitoring encounter 04/30/2016  . Hematuria, microscopic 04/30/2016  . COPD exacerbation ([place]) 12/22/2015  . COPD with chronic bronchitis ([place]) 09/08/2014  . Mitral valve stenosis 08/12/2014  . History of MI (myocardial infarction) 08/04/2014  . Atrial fibrillation ([place]) 08/04/2014  . Hyperlipidemia 08/04/2014  . Essential hypertension, benign 08/04/2014   Past Surgical History:  Procedure Laterality Date  . APPENDECTOMY    . CARDIOVASCULAR STRESS TEST  03/2014    Borderline reversible ischemic changes at the apex.  Normal LV contractility/EF 52%.  . CORONARY STENT PLACEMENT  7/12013  . LEFT HEART CATHETERIZATION WITH CORONARY ANGIOGRAM N/A 06/12/2012   Procedure: LEFT HEART CATHETERIZATION WITH CORONARY ANGIOGRAM;  Surgeon: Clent Demark, MD;  Location[facility] C[facility] LAB;  Service: Cardiovascular;  Laterality: N/A;  . PERCUTANEOUS CORONARY STENT INTERVENTION (PCI-S) N/A 06/17/2012   Procedure: PERCUTANEOUS CORONARY STENT INTERVENTION (PCI-S);  Surgeon: Clent Demark, MD;  Location: [facility] CATH LAB;  Service: Cardiovascular;  Laterality: N/A;   Social History  Substance Use Topics  . Smoking status: Former Smoker    Years: 0.00    Types: Cigarettes    Quit date: 11/26/1996  . Smokeless tobacco: Never Used  . Alcohol use No   Family History  Problem Relation Age of Onset  . Heart disease Mother   . Heart disease Father      Current medication list and allergy/intolerance information reviewed:   Current Outpatient Prescriptions on File Prior to Visit  Medication Sig Dispense Refill  . albuterol (PROVENTIL) (5 MG/ML) 0.5% nebulizer solution Take 0.5 mLs (2.5 mg total) by nebulization every 6 (six) hours as needed for wheezing or shortness of breath. 20 mL 1  . Albuterol Sulfate (PROAIR RESPICLICK) 123XX123 (90 Base) MCG/ACT AEPB Inhale 1 puff into the lungs every 6 (six) hours as needed (sob). 1 each 1  . aspirin 81 MG tablet Take 81 mg by mouth daily.    . carvedilol (COREG) 12.5 MG tablet Take 0.5 tablets (6.25 mg total) by mouth 2 (two) times daily with a meal.    . cyclobenzaprine (FLEXERIL) 5 MG tablet Take 1 tablet (5 mg total) by mouth 3 (three)  times daily as needed for muscle spasms. 30 tablet 1  . Fluticasone Furoate (ARNUITY ELLIPTA) 200 MCG/ACT AEPB Inhale 1 puff into the lungs daily. 30 each 12  . losartan (COZAAR) 50 MG tablet Take 50 mg by mouth daily.    . Naproxen-Esomeprazole 500-20 MG TBEC Take 1 tablet by mouth 2 (two) times daily. 60  tablet 0  . spironolactone (ALDACTONE) 25 MG tablet Take 1 tablet (25 mg total) by mouth daily.    . traMADol (ULTRAM) 50 MG tablet Take 1 tablet (50 mg total) by mouth every 8 (eight) hours as needed. 30 tablet 1  . warfarin (COUMADIN) 5 MG tablet Take 5 mg by mouth daily. At 6pm    . atorvastatin (LIPITOR) 80 MG tablet Take 1 tablet (80 mg total) by mouth daily at 6 PM. 30 tablet 3  . nitroGLYCERIN (NITROSTAT) 0.4 MG SL tablet Place 1 tablet (0.4 mg total) under the tongue every 5 (five) minutes x 3 doses as needed for chest pain. 25 tablet 3  . [DISCONTINUED] amiodarone (PACERONE) 200 MG tablet Take 1 tablet (200 mg total) by mouth 2 (two) times daily. 60 tablet 3  . [DISCONTINUED] ramipril (ALTACE) 2.5 MG capsule Take 2.5 mg by mouth daily.      No current facility-administered medications on file prior to visit.    Allergies  Allergen Reactions  . Ramipril     cough      Review of Systems:  Constitutional: +recent illness, No fever or chills, no fatigue  Cardiac: No  chest pain, No  pressure  Respiratory:  No  shortness of breath. +Cough  Neurologic: No  weakness, No  Dizziness   Exam:  BP 124/80   Pulse 86   Ht 5\' 11"  (1.803 m)   Wt (!) 321 lb (145.6 kg)   BMI 44.77 kg/m   Constitutional: VS see above. General Appearance: alert, well-developed, well-nourished, NAD  Eyes: Normal lids and conjunctive, non-icteric sclera  Ears, Nose, Mouth, Throat: MMM, Normal external inspection ears/nares/mouth/lips/gums.  Neck: No masses, trachea midline.   Respiratory: Normal respiratory effort. no wheeze, no rhonchi, no rales   Cardiovascular: S1/S2 normal, no murmur, no rub/gallop auscultated. RRR.   Musculoskeletal: Gait normal. Symmetric and independent movement of all extremities  Neurological: Normal balance/coordination. No tremor.  Skin: warm, dry, intact.   Psychiatric: Normal judgment/insight. Normal mood and affect. Oriented x3.    Chest x-ray personally  reviewed with the patient, cardiomegaly but no changes concerning for bronchitis/pneumonia per my review, see radiology over read as below.  Dg Chest 2 View  Result Date: 11/06/2016 CLINICAL DATA:  Hoarseness, cough, congestion EXAM: CHEST  2 VIEW COMPARISON:  Chest x-ray of 03/20/2016 FINDINGS: No active infiltrate or effusion is seen. Mediastinal and hilar contours are unchanged. Cardiomegaly is stable. There are degenerative changes in the mid to lower thoracic spine. IMPRESSION: Stable cardiomegaly.  No active lung disease. Electronically Signed   By: Ivar Drape M.D.   On: 11/06/2016 15:57     ASSESSMENT/PLAN:    Essential hypertension, benign - Controlled on current medications  Chronic atrial fibrillation (HCC) - INR managed by cardio  COPD with chronic bronchitis (HCC) - Chronic cough, no shortness of breath/fever, no concerning findings on chest x-ray. RTC for pulmonary function test, consider pulmonary referral  History of MI (myocardial infarction) - On BB, ARB, Statin, ASA. Following w/ Cardio  Hyperlipidemia, unspecified hyperlipidemia type  Cough - Plan: DG Chest 2 View      Visit summary  with medication list and pertinent instructions was printed for patient to review. All questions at time of visit were answered - patient instructed to contact office with any additional concerns. ER/RTC precautions were reviewed with the patient. Follow-up plan: Return for [place] next year, and at your convenience for lung function test .  Note: Total time spent 40 minutes, greater than 50% of the visit was spent face-to-face counseling and coordinating care for the following: The primary encounter diagnosis was Essential hypertension, benign. Diagnoses of Chronic atrial fibrillation ([place]), COPD with chronic bronchitis ([place]), History of MI (myocardial infarction), Hyperlipidemia, unspecified hyperlipidemia type, and Cough were also pertinent to this visit.Marland Kitchen

## 2016-11-08 ENCOUNTER — Ambulatory Visit (INDEPENDENT_AMBULATORY_CARE_PROVIDER_SITE_OTHER): Payer: BLUE CROSS/BLUE SHIELD | Admitting: Osteopathic Medicine

## 2016-11-08 VITALS — BP 102/70 | HR 53 | Temp 97.8°F

## 2016-11-08 DIAGNOSIS — I10 Essential (primary) hypertension: Secondary | ICD-10-CM | POA: Diagnosis not present

## 2016-11-08 DIAGNOSIS — I252 Old myocardial infarction: Secondary | ICD-10-CM | POA: Diagnosis not present

## 2016-11-08 DIAGNOSIS — Z23 Encounter for immunization: Secondary | ICD-10-CM | POA: Diagnosis not present

## 2016-11-08 DIAGNOSIS — I2510 Atherosclerotic heart disease of native coronary artery without angina pectoris: Secondary | ICD-10-CM | POA: Diagnosis not present

## 2016-11-08 DIAGNOSIS — I482 Chronic atrial fibrillation: Secondary | ICD-10-CM | POA: Diagnosis not present

## 2016-11-08 NOTE — Progress Notes (Signed)
Patient came into clinic today for flu vaccination. Patient tolerated injection of flu immunization in left deltoid well, with no immediate complications. Advised to contact our office with any questions/concerns.

## 2016-11-12 ENCOUNTER — Inpatient Hospital Stay (HOSPITAL_COMMUNITY): Payer: BLUE CROSS/BLUE SHIELD

## 2016-11-12 ENCOUNTER — Encounter (HOSPITAL_COMMUNITY): Payer: Self-pay | Admitting: Emergency Medicine

## 2016-11-12 ENCOUNTER — Inpatient Hospital Stay (HOSPITAL_COMMUNITY)
Admission: EM | Admit: 2016-11-12 | Discharge: 2016-11-15 | DRG: 291 | Disposition: A | Discharge status: Home / Self Care | Payer: BLUE CROSS/BLUE SHIELD | Attending: Family Medicine | Admitting: Family Medicine

## 2016-11-12 ENCOUNTER — Emergency Department (HOSPITAL_COMMUNITY): Payer: BLUE CROSS/BLUE SHIELD

## 2016-11-12 DIAGNOSIS — Z7951 Long term (current) use of inhaled steroids: Secondary | ICD-10-CM | POA: Diagnosis not present

## 2016-11-12 DIAGNOSIS — I2510 Atherosclerotic heart disease of native coronary artery without angina pectoris: Secondary | ICD-10-CM | POA: Diagnosis present

## 2016-11-12 DIAGNOSIS — J441 Chronic obstructive pulmonary disease with (acute) exacerbation: Secondary | ICD-10-CM | POA: Diagnosis not present

## 2016-11-12 DIAGNOSIS — I255 Ischemic cardiomyopathy: Secondary | ICD-10-CM | POA: Diagnosis present

## 2016-11-12 DIAGNOSIS — I252 Old myocardial infarction: Secondary | ICD-10-CM

## 2016-11-12 DIAGNOSIS — I4891 Unspecified atrial fibrillation: Secondary | ICD-10-CM

## 2016-11-12 DIAGNOSIS — J9601 Acute respiratory failure with hypoxia: Secondary | ICD-10-CM | POA: Diagnosis not present

## 2016-11-12 DIAGNOSIS — I10 Essential (primary) hypertension: Secondary | ICD-10-CM | POA: Diagnosis not present

## 2016-11-12 DIAGNOSIS — I050 Rheumatic mitral stenosis: Secondary | ICD-10-CM | POA: Diagnosis present

## 2016-11-12 DIAGNOSIS — E785 Hyperlipidemia, unspecified: Secondary | ICD-10-CM | POA: Diagnosis present

## 2016-11-12 DIAGNOSIS — Z87891 Personal history of nicotine dependence: Secondary | ICD-10-CM

## 2016-11-12 DIAGNOSIS — I248 Other forms of acute ischemic heart disease: Secondary | ICD-10-CM | POA: Diagnosis present

## 2016-11-12 DIAGNOSIS — Z8249 Family history of ischemic heart disease and other diseases of the circulatory system: Secondary | ICD-10-CM

## 2016-11-12 DIAGNOSIS — I5042 Chronic combined systolic (congestive) and diastolic (congestive) heart failure: Secondary | ICD-10-CM | POA: Insufficient documentation

## 2016-11-12 DIAGNOSIS — I5033 Acute on chronic diastolic (congestive) heart failure: Secondary | ICD-10-CM | POA: Diagnosis not present

## 2016-11-12 DIAGNOSIS — I5031 Acute diastolic (congestive) heart failure: Secondary | ICD-10-CM | POA: Diagnosis not present

## 2016-11-12 DIAGNOSIS — Z87442 Personal history of urinary calculi: Secondary | ICD-10-CM

## 2016-11-12 DIAGNOSIS — I5043 Acute on chronic combined systolic (congestive) and diastolic (congestive) heart failure: Secondary | ICD-10-CM | POA: Diagnosis not present

## 2016-11-12 DIAGNOSIS — Z7982 Long term (current) use of aspirin: Secondary | ICD-10-CM | POA: Diagnosis not present

## 2016-11-12 DIAGNOSIS — Z79899 Other long term (current) drug therapy: Secondary | ICD-10-CM

## 2016-11-12 DIAGNOSIS — Z955 Presence of coronary angioplasty implant and graft: Secondary | ICD-10-CM | POA: Diagnosis not present

## 2016-11-12 DIAGNOSIS — Z8701 Personal history of pneumonia (recurrent): Secondary | ICD-10-CM | POA: Diagnosis not present

## 2016-11-12 DIAGNOSIS — J189 Pneumonia, unspecified organism: Secondary | ICD-10-CM | POA: Diagnosis not present

## 2016-11-12 DIAGNOSIS — Z6841 Body Mass Index (BMI) 40.0 and over, adult: Secondary | ICD-10-CM

## 2016-11-12 DIAGNOSIS — Z9049 Acquired absence of other specified parts of digestive tract: Secondary | ICD-10-CM

## 2016-11-12 DIAGNOSIS — Z7901 Long term (current) use of anticoagulants: Secondary | ICD-10-CM | POA: Diagnosis not present

## 2016-11-12 DIAGNOSIS — I481 Persistent atrial fibrillation: Secondary | ICD-10-CM | POA: Diagnosis not present

## 2016-11-12 DIAGNOSIS — I480 Paroxysmal atrial fibrillation: Secondary | ICD-10-CM | POA: Diagnosis not present

## 2016-11-12 DIAGNOSIS — I110 Hypertensive heart disease with heart failure: Secondary | ICD-10-CM | POA: Diagnosis not present

## 2016-11-12 DIAGNOSIS — I482 Chronic atrial fibrillation: Secondary | ICD-10-CM | POA: Diagnosis present

## 2016-11-12 DIAGNOSIS — Z888 Allergy status to other drugs, medicaments and biological substances status: Secondary | ICD-10-CM

## 2016-11-12 DIAGNOSIS — R0602 Shortness of breath: Secondary | ICD-10-CM | POA: Diagnosis not present

## 2016-11-12 DIAGNOSIS — I509 Heart failure, unspecified: Secondary | ICD-10-CM | POA: Diagnosis not present

## 2016-11-12 LAB — COMPREHENSIVE METABOLIC PANEL
ALBUMIN: 4 g/dL (ref 3.5–5.0)
ALK PHOS: 51 U/L (ref 38–126)
ALT: 21 U/L (ref 17–63)
ANION GAP: 12 (ref 5–15)
AST: 23 U/L (ref 15–41)
BILIRUBIN TOTAL: 1.4 mg/dL — AB (ref 0.3–1.2)
BUN: 18 mg/dL (ref 6–20)
CHLORIDE: 99 mmol/L — AB (ref 101–111)
CO2: 23 mmol/L (ref 22–32)
Calcium: 8.9 mg/dL (ref 8.9–10.3)
Creatinine, Ser: 1.06 mg/dL (ref 0.61–1.24)
GFR calc non Af Amer: >60 mL/min (ref >60)
Glucose, Bld: 125 mg/dL — ABNORMAL HIGH (ref 65–99)
POTASSIUM: 4.3 mmol/L (ref 3.5–5.1)
Sodium: 134 mmol/L — ABNORMAL LOW (ref 135–145)
Total Protein: 7.3 g/dL (ref 6.5–8.1)

## 2016-11-12 LAB — CBC WITH DIFFERENTIAL/PLATELET
BASOS ABS: 0 10*3/uL (ref 0.0–0.1)
BASOS PCT: 0 %
Eosinophils Absolute: 0 10*3/uL (ref 0.0–0.7)
Eosinophils Relative: 0 %
HEMATOCRIT: 44.6 % (ref 39.0–52.0)
HEMOGLOBIN: 15 g/dL (ref 13.0–17.0)
LYMPHS PCT: 7 %
Lymphs Abs: 0.8 10*3/uL (ref 0.7–4.0)
MCH: 30.1 pg (ref 26.0–34.0)
MCHC: 33.6 g/dL (ref 30.0–36.0)
MCV: 89.4 fL (ref 78.0–100.0)
Monocytes Absolute: 0.7 10*3/uL (ref 0.1–1.0)
Monocytes Relative: 6 %
NEUTROS ABS: 10.5 10*3/uL — AB (ref 1.7–7.7)
NEUTROS PCT: 87 %
Platelets: 197 10*3/uL (ref 150–400)
RBC: 4.99 MIL/uL (ref 4.22–5.81)
RDW: 15 % (ref 11.5–15.5)
WBC: 12 10*3/uL — ABNORMAL HIGH (ref 4.0–10.5)

## 2016-11-12 LAB — PROTIME-INR
INR: 1.81
Prothrombin Time: 21.3 seconds — ABNORMAL HIGH (ref 11.4–15.2)

## 2016-11-12 LAB — TROPONIN I
TROPONIN I: 0.03 ng/mL — AB (ref <0.03)
TROPONIN I: 0.03 ng/mL — AB (ref <0.03)

## 2016-11-12 LAB — INFLUENZA PANEL BY PCR (TYPE A & B)
INFLAPCR: NEGATIVE
INFLBPCR: NEGATIVE

## 2016-11-12 LAB — BRAIN NATRIURETIC PEPTIDE: B NATRIURETIC PEPTIDE 5: 293 pg/mL — AB (ref 0.0–100.0)

## 2016-11-12 MED ORDER — LISINOPRIL 5 MG PO TABS
5.0000 mg | ORAL_TABLET | Freq: Every day | ORAL | Status: DC
  Administered 2016-11-13 – 2016-11-15 (×3): 5 mg via ORAL
  Filled 2016-11-12 (×3): qty 1

## 2016-11-12 MED ORDER — IPRATROPIUM-ALBUTEROL 0.5-2.5 (3) MG/3ML IN SOLN
3.0000 mL | Freq: Four times a day (QID) | RESPIRATORY_TRACT | Status: DC
  Administered 2016-11-12 – 2016-11-13 (×5): 3 mL via RESPIRATORY_TRACT
  Filled 2016-11-12 (×5): qty 3

## 2016-11-12 MED ORDER — ALBUTEROL SULFATE (2.5 MG/3ML) 0.083% IN NEBU
2.5000 mg | INHALATION_SOLUTION | RESPIRATORY_TRACT | Status: DC | PRN
Start: 2016-11-12 — End: 2016-11-15
  Administered 2016-11-15: 2.5 mg via RESPIRATORY_TRACT
  Filled 2016-11-12: qty 3

## 2016-11-12 MED ORDER — METHYLPREDNISOLONE SODIUM SUCC 125 MG IJ SOLR
60.0000 mg | Freq: Two times a day (BID) | INTRAMUSCULAR | Status: DC
  Administered 2016-11-12 – 2016-11-14 (×4): 60 mg via INTRAVENOUS
  Filled 2016-11-12 (×5): qty 2

## 2016-11-12 MED ORDER — METOPROLOL TARTRATE 5 MG/5ML IV SOLN
2.5000 mg | Freq: Once | INTRAVENOUS | Status: AC
  Administered 2016-11-12: 2.5 mg via INTRAVENOUS
  Filled 2016-11-12: qty 5

## 2016-11-12 MED ORDER — FLUTICASONE FUROATE 200 MCG/ACT IN AEPB: 1.0000 | INHALATION_SPRAY | Freq: Every day | RESPIRATORY_TRACT | Status: DC | PRN

## 2016-11-12 MED ORDER — SODIUM CHLORIDE 0.9% FLUSH: 3.0000 mL | INTRAVENOUS | Status: DC | PRN

## 2016-11-12 MED ORDER — METOPROLOL TARTRATE 5 MG/5ML IV SOLN
5.0000 mg | Freq: Once | INTRAVENOUS | Status: AC
  Administered 2016-11-12: 5 mg via INTRAVENOUS
  Filled 2016-11-12: qty 5

## 2016-11-12 MED ORDER — FUROSEMIDE 10 MG/ML IJ SOLN
INTRAMUSCULAR | Status: AC
  Filled 2016-11-12: qty 4

## 2016-11-12 MED ORDER — ATORVASTATIN CALCIUM 40 MG PO TABS
80.0000 mg | ORAL_TABLET | Freq: Every day | ORAL | Status: DC
  Administered 2016-11-12 – 2016-11-14 (×3): 80 mg via ORAL
  Filled 2016-11-12 (×3): qty 2

## 2016-11-12 MED ORDER — SODIUM CHLORIDE 0.9 % IV SOLN: 250.0000 mL | INTRAVENOUS | Status: DC | PRN

## 2016-11-12 MED ORDER — ALBUTEROL SULFATE (2.5 MG/3ML) 0.083% IN NEBU: 2.5000 mg | INHALATION_SOLUTION | Freq: Four times a day (QID) | RESPIRATORY_TRACT | Status: DC | PRN

## 2016-11-12 MED ORDER — ALBUTEROL SULFATE (2.5 MG/3ML) 0.083% IN NEBU: 3.0000 mL | INHALATION_SOLUTION | Freq: Four times a day (QID) | RESPIRATORY_TRACT | Status: DC | PRN

## 2016-11-12 MED ORDER — BUDESONIDE 0.5 MG/2ML IN SUSP: 0.5000 mg | Freq: Two times a day (BID) | RESPIRATORY_TRACT | Status: DC | PRN

## 2016-11-12 MED ORDER — NITROGLYCERIN 0.4 MG SL SUBL: 0.4000 mg | SUBLINGUAL_TABLET | SUBLINGUAL | Status: DC | PRN

## 2016-11-12 MED ORDER — METHYLPREDNISOLONE SODIUM SUCC 125 MG IJ SOLR
125.0000 mg | Freq: Once | INTRAMUSCULAR | Status: AC
  Administered 2016-11-12: 125 mg via INTRAVENOUS
  Filled 2016-11-12: qty 2

## 2016-11-12 MED ORDER — CARVEDILOL 12.5 MG PO TABS
12.5000 mg | ORAL_TABLET | Freq: Two times a day (BID) | ORAL | Status: DC
  Administered 2016-11-12 – 2016-11-13 (×2): 12.5 mg via ORAL
  Filled 2016-11-12 (×2): qty 1

## 2016-11-12 MED ORDER — ALBUTEROL SULFATE (2.5 MG/3ML) 0.083% IN NEBU
5.0000 mg | INHALATION_SOLUTION | Freq: Once | RESPIRATORY_TRACT | Status: AC
  Administered 2016-11-12: 5 mg via RESPIRATORY_TRACT
  Filled 2016-11-12: qty 6

## 2016-11-12 MED ORDER — ASPIRIN EC 81 MG PO TBEC
81.0000 mg | DELAYED_RELEASE_TABLET | Freq: Every day | ORAL | Status: DC
  Administered 2016-11-12 – 2016-11-15 (×4): 81 mg via ORAL
  Filled 2016-11-12 (×4): qty 1

## 2016-11-12 MED ORDER — SODIUM CHLORIDE 0.9% FLUSH
3.0000 mL | Freq: Two times a day (BID) | INTRAVENOUS | Status: DC
  Administered 2016-11-12 – 2016-11-15 (×6): 3 mL via INTRAVENOUS

## 2016-11-12 MED ORDER — POTASSIUM CHLORIDE CRYS ER 20 MEQ PO TBCR
40.0000 meq | EXTENDED_RELEASE_TABLET | Freq: Every day | ORAL | Status: DC
  Administered 2016-11-12 – 2016-11-15 (×4): 40 meq via ORAL
  Filled 2016-11-12 (×4): qty 2

## 2016-11-12 MED ORDER — FUROSEMIDE 10 MG/ML IJ SOLN
80.0000 mg | Freq: Once | INTRAMUSCULAR | Status: AC
  Administered 2016-11-12: 80 mg via INTRAVENOUS
  Filled 2016-11-12: qty 8

## 2016-11-12 MED ORDER — CYCLOBENZAPRINE HCL 10 MG PO TABS: 5.0000 mg | ORAL_TABLET | Freq: Three times a day (TID) | ORAL | Status: DC | PRN

## 2016-11-12 MED ORDER — FUROSEMIDE 10 MG/ML IJ SOLN
60.0000 mg | Freq: Two times a day (BID) | INTRAMUSCULAR | Status: DC
  Administered 2016-11-13 – 2016-11-14 (×3): 60 mg via INTRAVENOUS
  Filled 2016-11-12 (×5): qty 6

## 2016-11-12 MED ORDER — WARFARIN - PHARMACIST DOSING INPATIENT
Status: DC
  Administered 2016-11-13: 16:00:00

## 2016-11-12 MED ORDER — WARFARIN SODIUM 2 MG PO TABS
4.0000 mg | ORAL_TABLET | Freq: Once | ORAL | Status: AC
  Administered 2016-11-12: 4 mg via ORAL
  Filled 2016-11-12: qty 2

## 2016-11-12 MED ORDER — SPIRONOLACTONE 25 MG PO TABS
25.0000 mg | ORAL_TABLET | Freq: Every day | ORAL | Status: DC
  Administered 2016-11-12 – 2016-11-15 (×4): 25 mg via ORAL
  Filled 2016-11-12 (×4): qty 1

## 2016-11-12 MED ORDER — ACETAMINOPHEN 325 MG PO TABS: 650.0000 mg | ORAL_TABLET | ORAL | Status: DC | PRN

## 2016-11-12 MED ORDER — LEVOFLOXACIN IN D5W 750 MG/150ML IV SOLN
750.0000 mg | INTRAVENOUS | Status: DC
  Administered 2016-11-12: 750 mg via INTRAVENOUS
  Filled 2016-11-12: qty 150

## 2016-11-12 MED ORDER — METOPROLOL TARTRATE 5 MG/5ML IV SOLN
5.0000 mg | INTRAVENOUS | Status: DC | PRN
  Administered 2016-11-13 (×2): 5 mg via INTRAVENOUS
  Filled 2016-11-12 (×2): qty 5

## 2016-11-12 MED ORDER — ONDANSETRON HCL 4 MG/2ML IJ SOLN: 4.0000 mg | Freq: Four times a day (QID) | INTRAMUSCULAR | Status: DC | PRN

## 2016-11-12 NOTE — Progress Notes (Signed)
Pharmacy Antibiotic Note  Jeffrey Larson is a 60 y.o. male admitted on 11/12/2016 with CAP.  Pharmacy has been consulted for [facility] dosing.  Plan: Levaquin 750mg  IV q24hrs Monitor labs, progress, c/s  Height: 5\' 11"  (180.3 cm) Weight: (!) 321 lb (145.6 kg) IBW/kg (Calculated) : 75.3  Temp (24hrs), Avg:99.7 F (37.6 C), Min:99.7 F (37.6 C), Max:99.7 F (37.6 C)   Recent Labs Lab 11/12/16 1059  WBC 12.0*  CREATININE 1.06    Estimated Creatinine Clearance: 108.4 mL/min (by C-G formula based on SCr of 1.06 mg/dL).    Allergies  Allergen Reactions  . Ramipril     cough    Antimicrobials this admission: Levaquin 12/18 >>   Dose adjustments this admission:  Microbiology results:  BCx: pending  UCx: pending   Sputum:    MRSA PCR:   Thank you for allowing pharmacy to be a part of this patient's care.  Hart Robinsons A 11/12/2016 2:50 PM

## 2016-11-12 NOTE — H&P (Signed)
TRH H&P   Patient Demographics:    Jeffrey Larson, is a 60 y.o. male  MRN: [id number]   DOB - 1956/06/05  Admit Date - 11/12/2016  Outpatient Primary MD for the patient is [name], DO  Outpatient Specialists: Dr [name]    Patient coming from: Home  Chief Complaint  Patient presents with  . Shortness of Breath      HPI:    Jeffrey Larson  is a 60 y.o. male, With history of CAD status post 3 stent placements in the past cardiologist Dr. [name], ischemic cardiomyopathy with combined systolic and diastolic heart failure EF around 50%, COPD but never had formal PFTs, chronic atrial fibrillation on Coumadin, dyslipidemia who comes to the hospital with 2-3 day history of gradually progressive shortness of breath accompanied by orthopnea, increased edema in his legs and the clear frothy phlegm.  Does complain of some subjective fevers, no exposure to sick contacts, no chest pain, no headache, no body aches, no abdominal pain, no blood in stool or urine or dysuria, no focal weakness. In the ER workup suggests of acute hypoxic respiratory failure due to CHF possible bronchitis or COPD exacerbation as he is wheezing, also in A. fib with RVR and I was called to admit the patient.    Review of systems:    In addition to the HPI above,   No Fever-chills, No Headache, No changes with Vision or hearing, No problems swallowing food or Liquids, No Chest pain, Positive Cough and Shortness of Breath along with orthopnea, No Abdominal pain, No Nausea or Vommitting, Bowel movements are regular, No Blood in stool or Urine, No dysuria, No new skin rashes or bruises, No new joints pains-aches,  No new weakness,  tingling, numbness in any extremity, No recent weight gain or loss, No polyuria, polydypsia or polyphagia, No significant Mental Stressors.  A full 10 point Review of Systems was done, except as stated above, all other Review of Systems were negative.   With Past History of the following :    Past Medical History:  Diagnosis Date  . Afib ([place])   . Coronary artery disease   . History of pneumonia    RML on CXR 07/20/14  . Hyperlipidemia   . Myocardial infarction   . Renal disorder    history kidney stone  Past Surgical History:  Procedure Laterality Date  . APPENDECTOMY    . CARDIOVASCULAR STRESS TEST  03/2014   Borderline reversible ischemic changes at the apex.  Normal LV contractility/EF 52%.  . CORONARY STENT PLACEMENT  7/12013  . LEFT HEART CATHETERIZATION WITH CORONARY ANGIOGRAM N/A 06/12/2012   Procedure: LEFT HEART CATHETERIZATION WITH CORONARY ANGIOGRAM;  Surgeon: Clent Demark, MD;  Location: [facility] CATH LAB;  Service: Cardiovascular;  Laterality: N/A;  . PERCUTANEOUS CORONARY STENT INTERVENTION (PCI-S) N/A 06/17/2012   Procedure: PERCUTANEOUS CORONARY STENT INTERVENTION (PCI-S);  Surgeon: Clent Demark, MD;  Location: [facility] CATH LAB;  Service: Cardiovascular;  Laterality: N/A;      Social History:     Social History  Substance Use Topics  . Smoking status: Former Smoker    Years: 0.00    Types: Cigarettes    Quit date: 11/26/1996  . Smokeless tobacco: Never Used  . Alcohol use No      Family History :     Family History  Problem Relation Age of Onset  . Heart disease Mother   . Heart disease Father        Home Medications:   Prior to Admission medications   Medication Sig Start Date End Date Taking? Authorizing Provider  albuterol (PROVENTIL) (5 MG/ML) 0.5% nebulizer solution Take 0.5 mLs (2.5 mg total) by nebulization every 6 (six) hours as needed for wheezing or shortness of breath. 07/26/16  Yes Silverio Decamp, MD  Albuterol Sulfate  (PROAIR RESPICLICK) 123XX123 (90 Base) MCG/ACT AEPB Inhale 1 puff into the lungs every 6 (six) hours as needed (sob). 12/22/15  Yes Gregor Hams, MD  aspirin 81 MG tablet Take 81 mg by mouth daily.   Yes Historical Provider, MD  atorvastatin (LIPITOR) 80 MG tablet Take 1 tablet (80 mg total) by mouth daily at 6 PM. 06/18/12 11/12/16 Yes [name], MD  carvedilol (COREG) 12.5 MG tablet Take 0.5 tablets (6.25 mg total) by mouth 2 (two) times daily with a meal. 10/11/14  Yes Sean Hommel, DO  cyclobenzaprine (FLEXERIL) 5 MG tablet Take 1 tablet (5 mg total) by mouth 3 (three) times daily as needed for muscle spasms. 05/21/16  Yes Sean Hommel, DO  Fluticasone Furoate (ARNUITY ELLIPTA) 200 MCG/ACT AEPB Inhale 1 puff into the lungs daily. Patient taking differently: Inhale 1 puff into the lungs daily as needed (shorntess of breath).  03/20/16  Yes Gregor Hams, MD  furosemide (LASIX) 40 MG tablet Take 1 tablet by mouth daily. 09/26/16  Yes Historical Provider, MD  losartan (COZAAR) 50 MG tablet Take 50 mg by mouth daily.   Yes Historical Provider, MD  potassium chloride SA (K-DUR,KLOR-CON) 20 MEQ tablet Take 10 tablets by mouth daily. 09/16/16  Yes Historical Provider, MD  spironolactone (ALDACTONE) 25 MG tablet Take 1 tablet (25 mg total) by mouth daily. 08/12/14  Yes Sean Hommel, DO  Tiotropium Bromide-Olodaterol (STIOLTO RESPIMAT) 2.5-2.5 MCG/ACT AERS Inhale 2 puffs into the lungs daily. 11/06/16  Yes [name], DO  traMADol (ULTRAM) 50 MG tablet Take 1 tablet (50 mg total) by mouth every 8 (eight) hours as needed. 05/21/16  Yes Sean Hommel, DO  warfarin (COUMADIN) 4 MG tablet Take 2-4 mg by mouth daily. Take 4 mg every day except on Sunday and Wednesday take 2 mg.   Yes Historical Provider, MD  nitroGLYCERIN (NITROSTAT) 0.4 MG SL tablet Place 1 tablet (0.4 mg total) under the tongue every 5 (five) minutes x 3 doses as needed for chest  pain. 06/18/12 07/20/14  [name], MD     Allergies:       Allergies  Allergen Reactions  . Ramipril     cough     Physical Exam:   Vitals  Blood pressure 103/92, pulse 110, temperature 99.7 F (37.6 C), temperature source Oral, resp. rate (!) 29, height 5\' 11"  (1.803 m), weight (!) 145.6 kg (321 lb), SpO2 100 %.   1. General Middle-aged morbidly obese white male sitting in hospital chair with mild shortness of breath,  2. Normal affect and insight, Not Suicidal or Homicidal, Awake Alert, Oriented X 3.  3. No F.N deficits, ALL C.Nerves Intact, Strength 5/5 all 4 extremities, Sensation intact all 4 extremities, Plantars down going.  4. Ears and Eyes appear Normal, Conjunctivae clear, PERRLA. Moist Oral Mucosa.  5. Supple Neck, No JVD, No cervical lymphadenopathy appriciated, No Carotid Bruits.  6. Symmetrical Chest wall movement, Good air movement bilaterally, positive rales and wheezes,  7. iRRR, No Gallops, Rubs or Murmurs, No Parasternal Heave.  8. Positive Bowel Sounds, Abdomen Soft, No tenderness, No organomegaly appriciated,No rebound -guarding or rigidity.  9.  No Cyanosis, Normal Skin Turgor, No Skin Rash or Bruise. 1+ lower extremity edema.  10. Good muscle tone,  joints appear normal , no effusions, Normal ROM.  11. No Palpable Lymph Nodes in Neck or Axillae      Data Review:    CBC  Recent Labs Lab 11/12/16 1059  WBC 12.0*  HGB 15.0  HCT 44.6  PLT 197  MCV 89.4  MCH 30.1  MCHC 33.6  RDW 15.0  LYMPHSABS 0.8  MONOABS 0.7  EOSABS 0.0  BASOSABS 0.0   ------------------------------------------------------------------------------------------------------------------  Chemistries   Recent Labs Lab 11/12/16 1059  NA 134*  K 4.3  CL 99*  CO2 23  GLUCOSE 125*  BUN 18  CREATININE 1.06  CALCIUM 8.9  AST 23  ALT 21  ALKPHOS 51  BILITOT 1.4*   ------------------------------------------------------------------------------------------------------------------ estimated creatinine clearance is 108.4  mL/min (by C-G formula based on SCr of 1.06 mg/dL). ------------------------------------------------------------------------------------------------------------------ No results for input(s): TSH, T4TOTAL, T3FREE, THYROIDAB in the last 72 hours.  Invalid input(s): FREET3  Coagulation profile  Recent Labs Lab 11/12/16 1103  INR 1.81   ------------------------------------------------------------------------------------------------------------------- No results for input(s): DDIMER in the last 72 hours. -------------------------------------------------------------------------------------------------------------------  Cardiac Enzymes  Recent Labs Lab 11/12/16 1059  TROPONINI 0.03*   ------------------------------------------------------------------------------------------------------------------    Component Value Date/Time   BNP 293.0 (H) 11/12/2016 1059     ---------------------------------------------------------------------------------------------------------------  Urinalysis    Component Value Date/Time   BILIRUBINUR NEGATIVE 04/30/2016 1516   KETONESUR negative 01/15/2015 1647   PROTEINUR 30MG  04/30/2016 1516   UROBILINOGEN 4.0 04/30/2016 1516   NITRITE NEGATIVE 04/30/2016 1516   LEUKOCYTESUR Negative 04/30/2016 1516    ----------------------------------------------------------------------------------------------------------------   Imaging Results:    Dg Chest 2 View  Result Date: 11/12/2016 CLINICAL DATA:  Shortness of breath and chest congestion worsening over the past 2 weeks. History of atrial fibrillation. Coronary artery disease with stent placement, previous MI, COPD, former smoker. EXAM: CHEST  2 VIEW COMPARISON:  PA and lateral chest x-ray of November 06, 2016. FINDINGS: The lungs are well-expanded. The interstitial markings are coarse and slightly more conspicuous in the lower lobes today than on the previous study. There is no alveolar infiltrate.  There is no pleural effusion. The cardiac silhouette is enlarged. The pulmonary vascularity is prominent centrally and slightly more conspicuous than on the previous study. The mediastinum is normal in width.  There is mild multilevel degenerative disc disease of the thoracic spine. IMPRESSION: Cardiomegaly with mild central pulmonary vascular congestion and mild interstitial edema. Underlying COPD. No alveolar pneumonia. Electronically Signed   By: David  Martinique M.D.   On: 11/12/2016 12:01    My personal review of EKG: Rhythm Afib, Rate  115,  no Acute ST changes   Assessment & Plan:      1. Acute hypoxic respiratory failure due to acute on chronic combined systolic and diastolic CHF. Will admit to telemetry, IV Lasix along with Aldactone, called fluid restriction, daily weight, monitor intake and output, continue Coreg, low-dose ACE inhibitor and monitor. Blood pressure too low for any nitro paste on Imdur for now. Repeat echocardiogram. Cardiology requested to see as well.  2. Chronic A. fib with RVR. [place] vasc 2 score of at least 4. He has missed his morning Coreg dose which will be given, telemetry monitor, IV Lopressor as needed, Coumadin to be monitored by pharmacy.  3. History of COPD. He is wheezing but I think this is mostly due to #1 above, he never had any PFTs, question COPD diagnosis however since he is wheezing trial of low-dose steroids, wheezing likely to improve with Lasix as well. If better rapidly tapered off steroids. Recommend getting outpatient PFTs to sort out underlying COPD. And tinea supportive care with oxygen and nebulizer treatments.  4. Productive cough. Likely due to #1 above. However he does have mild leukocytosis and could have mild bronchitis. Trial of Levaquin. Check sputum Gram stain and culture.  5. Essential hypertension- blood pressure mildly soft, and tinea Coreg at home dose, reduce ACE inhibitor to provide room for diuretics.  6. CAD with ischemic  cardiomyopathy and 3 stents in the past. Chest pain-free, EKG nonacute, trend troponin, borderline troponin likely due to demand ischemia from #1. Continue aspirin, Coreg and statin for secondary prevention. Continue as needed sublingual nitroglycerin for any chest pain.  7. Dyslipidemia. Continue home dose statin     DVT Prophylaxis Coumadin  AM Labs Ordered, also please review Full Orders  Family Communication: Admission, patients condition and plan of care including tests being ordered have been discussed with the patient and wife who indicate understanding and agree with the plan and Code Status.  Code Status Full  Likely DC to  Home 2-3 days  Condition Fair  Consults called: None    Admission status: Inpt    Time spent in minutes : 35   Hermelinda Diegel K M.D on 11/12/2016 at 3:05 PM  Between 7am to 7pm - Pager - [phone]. After 7pm go to [email or web address] - password [facility]  [facility] - Office  [phone]

## 2016-11-12 NOTE — ED Notes (Signed)
CRITICAL VALUE ALERT  Critical value received:  Troponin 0.03  Date of notification:  11/12/2016  Time of notification:  1138  Critical value read back: Yes  Nurse who received alert:  Cena Benton  MD notified (1st page):  Dr. Reather Converse  Time of first page:  1138  Responding MD :  Dr. Reather Converse  Time MD responded:  [phone]

## 2016-11-12 NOTE — ED Triage Notes (Signed)
COPD pt went to urgent care, sent here, SOB, cough with yellow sputum

## 2016-11-12 NOTE — ED Provider Notes (Signed)
[facility] DEPT Provider Note   CSN: [id number] Arrival date & time: 11/12/16  [id number]  By signing my name below, I, Jeffrey Larson, attest that this documentation has been prepared under the direction and in the presence of Jeffrey Morrison, MD. Electronically Signed: Judithe Larson, ER Scribe. 07/07/2016. 11:14 AM.  History   Chief Complaint Chief Complaint  Patient presents with  . Shortness of Breath   HPI  HPI Comments: Jeffrey Larson is a 60 y.o. male who presents to the Emergency Department complaining of three days of worsening SOB with associated chest congestion, cough and intermittent fever. He had a URI two weeks ago and was prescribed amoxicillin and prednisone. After completing those medications his sx worsened. He has a PMHx of COPD, MI, cardiac stent placement, HTN and HLD. He has not been hospitalized recently. He denies leg swelling increased from baseline. He has no surgeries in the past 3 months. He has no PMHx of cancer or blood clots. He does not drink.   Past Medical History:  Diagnosis Date  . Afib ([place])   . Coronary artery disease   . History of pneumonia    RML on CXR 07/20/14  . Hyperlipidemia   . Myocardial infarction   . Renal disorder    history kidney stone    Patient Active Problem List   Diagnosis Date Noted  . Right shoulder pain 07/09/2016  . Bilateral low back pain without sciatica 04/30/2016  . Medication monitoring encounter 04/30/2016  . Hematuria, microscopic 04/30/2016  . Acute exacerbation of chronic obstructive pulmonary disease (COPD) ([place]) 12/22/2015  . COPD with chronic bronchitis ([place]) 09/08/2014  . Mitral valve stenosis 08/12/2014  . CAP (community acquired pneumonia) 08/04/2014  . History of MI (myocardial infarction) 08/04/2014  . Atrial fibrillation with RVR ([place]) 08/04/2014  . Hyperlipidemia 08/04/2014  . Essential hypertension, benign 08/04/2014    Past Surgical History:  Procedure Laterality Date  . APPENDECTOMY     . CARDIOVASCULAR STRESS TEST  03/2014   Borderline reversible ischemic changes at the apex.  Normal LV contractility/EF 52%.  . CORONARY STENT PLACEMENT  7/12013  . LEFT HEART CATHETERIZATION WITH CORONARY ANGIOGRAM N/A 06/12/2012   Procedure: LEFT HEART CATHETERIZATION WITH CORONARY ANGIOGRAM;  Surgeon: Clent Demark, MD;  Location[facility] C[facility] LAB;  Service: Cardiovascular;  Laterality: N/A;  . PERCUTANEOUS CORONARY STENT INTERVENTION (PCI-S) N/A 06/17/2012   Procedure: PERCUTANEOUS CORONARY STENT INTERVENTION (PCI-S);  Surgeon: Clent Demark, MD;  Location: [facility] CATH LAB;  Service: Cardiovascular;  Laterality: N/A;     Home Medications    Prior to Admission medications   Medication Sig Start Date End Date Taking? Authorizing Provider  albuterol (PROVENTIL) (5 MG/ML) 0.5% nebulizer solution Take 0.5 mLs (2.5 mg total) by nebulization every 6 (six) hours as needed for wheezing or shortness of breath. 07/26/16  Yes Silverio Decamp, MD  Albuterol Sulfate (PROAIR RESPICLICK) [id number] (90 Base) MCG/ACT AEPB Inhale 1 puff into the lungs every 6 (six) hours as needed (sob). 12/22/15  Yes Gregor Hams, MD  aspirin 81 MG tablet Take 81 mg by mouth daily.   Yes Historical Provider, MD  atorvastatin (LIPITOR) 80 MG tablet Take 1 tablet (80 mg total) by mouth daily at 6 PM. 06/18/12 11/12/16 Yes [name], MD  carvedilol (COREG) 12.5 MG tablet Take 0.5 tablets (6.25 mg total) by mouth 2 (two) times daily with a meal. 10/11/14  Yes Sean Hommel, DO  cyclobenzaprine (FLEXERIL) 5 MG tablet Take 1 tablet (5  mg total) by mouth 3 (three) times daily as needed for muscle spasms. 05/21/16  Yes Sean Hommel, DO  Fluticasone Furoate (ARNUITY ELLIPTA) 200 MCG/ACT AEPB Inhale 1 puff into the lungs daily. Patient taking differently: Inhale 1 puff into the lungs daily as needed (shorntess of breath).  03/20/16  Yes Gregor Hams, MD  furosemide (LASIX) 40 MG tablet Take 1 tablet by mouth daily. 09/26/16  Yes Historical  Provider, MD  losartan (COZAAR) 50 MG tablet Take 50 mg by mouth daily.   Yes Historical Provider, MD  potassium chloride SA (K-DUR,KLOR-CON) 20 MEQ tablet Take 10 tablets by mouth daily. 09/16/16  Yes Historical Provider, MD  spironolactone (ALDACTONE) 25 MG tablet Take 1 tablet (25 mg total) by mouth daily. 08/12/14  Yes Sean Hommel, DO  Tiotropium Bromide-Olodaterol (STIOLTO RESPIMAT) 2.5-2.5 MCG/ACT AERS Inhale 2 puffs into the lungs daily. 11/06/16  Yes [name], DO  traMADol (ULTRAM) 50 MG tablet Take 1 tablet (50 mg total) by mouth every 8 (eight) hours as needed. 05/21/16  Yes Sean Hommel, DO  warfarin (COUMADIN) 4 MG tablet Take 2-4 mg by mouth daily. Take 4 mg every day except on Sunday and Wednesday take 2 mg.   Yes Historical Provider, MD  nitroGLYCERIN (NITROSTAT) 0.4 MG SL tablet Place 1 tablet (0.4 mg total) under the tongue every 5 (five) minutes x 3 doses as needed for chest pain. 06/18/12 07/20/14  [name], MD    Family History Family History  Problem Relation Age of Onset  . Heart disease Mother   . Heart disease Father     Social History Social History  Substance Use Topics  . Smoking status: Former Smoker    Years: 0.00    Types: Cigarettes    Quit date: 11/26/1996  . Smokeless tobacco: Never Used  . Alcohol use No     Allergies   Ramipril   Review of Systems Review of Systems  Constitutional: Positive for fever.  Respiratory: Positive for cough and shortness of breath.   Cardiovascular: Negative for chest pain and leg swelling.  Gastrointestinal: Negative for abdominal pain.  All other systems reviewed and are negative.  At least 10pt or greater review of systems completed and are negative except where specified in the HPI.  Physical Exam Updated Vital Signs BP 103/92 (BP Location: Left Arm)   Pulse 110   Temp 99.7 F (37.6 C) (Oral)   Resp (!) 29   Ht 5\' 11"  (1.803 m)   Wt (!) 321 lb (145.6 kg)   SpO2 100% Comment: during breathing  treatment  BMI 44.77 kg/m   Physical Exam  Constitutional: He appears well-developed and well-nourished. No distress.  HENT:  Head: Normocephalic and atraumatic.  Mildly dry mucous membranes   Eyes: Pupils are equal, round, and reactive to light.  Neck: Neck supple.  Cardiovascular: An irregularly irregular rhythm present. Tachycardia present.   Mild edema bilateral lower extremities. HR fast and irregular.  Pulmonary/Chest: Effort normal. No respiratory distress.  Expiratory wheezing bilaterally, rales on right. Mild increased effort for breathing.  Abdominal:  Abdomen soft, non tender.   Musculoskeletal: Normal range of motion.  Neurological: He is alert. Coordination normal.  Skin: Skin is warm and dry. He is not diaphoretic.  Psychiatric: He has a normal mood and affect. His behavior is normal.  Nursing note and vitals reviewed.    ED Treatments / Results  Labs (all labs ordered are listed, but only abnormal results are displayed) Labs Reviewed  CBC  WITH DIFFERENTIAL/PLATELET - Abnormal; Notable for the following:       Result Value   WBC 12.0 (*)    Neutro Abs 10.5 (*)    All other components within normal limits  COMPREHENSIVE METABOLIC PANEL - Abnormal; Notable for the following:    Sodium 134 (*)    Chloride 99 (*)    Glucose, Bld 125 (*)    Total Bilirubin 1.4 (*)    All other components within normal limits  TROPONIN I - Abnormal; Notable for the following:    Troponin I 0.03 (*)    All other components within normal limits  PROTIME-INR - Abnormal; Notable for the following:    Prothrombin Time 21.3 (*)    All other components within normal limits  BRAIN NATRIURETIC PEPTIDE - Abnormal; Notable for the following:    B Natriuretic Peptide 293.0 (*)    All other components within normal limits  INFLUENZA PANEL BY PCR (TYPE A & B, H1N1)    EKG  EKG Interpretation  Date/Time:  Monday November 12 2016 10:36:48 EST Ventricular Rate:  142 PR Interval:      QRS Duration: 114 QT Interval:  323 QTC Calculation: 497 R Axis:   115 Text Interpretation:  A fib rvr Incomplete right bundle branch block Borderline prolonged QT interval Baseline wander in lead(s) III V4 Confirmed by Reather Converse MD, Montrail Mehrer [phone]) on 11/12/2016 2:44:29 PM       Radiology Dg Chest 2 View  Result Date: 11/12/2016 CLINICAL DATA:  Shortness of breath and chest congestion worsening over the past 2 weeks. History of atrial fibrillation. Coronary artery disease with stent placement, previous MI, COPD, former smoker. EXAM: CHEST  2 VIEW COMPARISON:  PA and lateral chest x-ray of November 06, 2016. FINDINGS: The lungs are well-expanded. The interstitial markings are coarse and slightly more conspicuous in the lower lobes today than on the previous study. There is no alveolar infiltrate. There is no pleural effusion. The cardiac silhouette is enlarged. The pulmonary vascularity is prominent centrally and slightly more conspicuous than on the previous study. The mediastinum is normal in width. There is mild multilevel degenerative disc disease of the thoracic spine. IMPRESSION: Cardiomegaly with mild central pulmonary vascular congestion and mild interstitial edema. Underlying COPD. No alveolar pneumonia. Electronically Signed   By: David  Martinique M.D.   On: 11/12/2016 12:01    Procedures Procedures (including critical care time)  Medications Ordered in ED Medications  albuterol (PROVENTIL) (2.5 MG/3ML) 0.083% nebulizer solution 5 mg (5 mg Nebulization Given 11/12/16 1105)  methylPREDNISolone sodium succinate (SOLU-MEDROL) 125 mg/2 mL injection 125 mg (125 mg Intravenous Given 11/12/16 1118)  metoprolol (LOPRESSOR) injection 2.5 mg (2.5 mg Intravenous Given 11/12/16 1142)  albuterol (PROVENTIL) (2.5 MG/3ML) 0.083% nebulizer solution 5 mg (5 mg Nebulization Given 11/12/16 1341)  albuterol (PROVENTIL) (2.5 MG/3ML) 0.083% nebulizer solution 5 mg (5 mg Nebulization Given 11/12/16 1356)   metoprolol (LOPRESSOR) injection 5 mg (5 mg Intravenous Given 11/12/16 1343)     Initial Impression / Assessment and Plan / ED Course  I have reviewed the triage vital signs and the nursing notes.  Pertinent labs & imaging results that were available during my care of the patient were reviewed by me and considered in my medical decision making (see chart for details).  Clinical Course    Patient presents with clinically infectious symptoms/COPD. With x-ray wheezing on exam repeat nebulizers ordered. Patient mild improvement reassessment. Mild atrial fibrillation with RVR improved with metoprolol. Patient is not had  his home medicines. Plan for admission to the hospital/observation for further treatment. Discussed with triad hospitalist.  The patients results and plan were reviewed and discussed.   Any x-rays performed were independently reviewed by myself.   Differential diagnosis were considered with the presenting HPI.  Medications  albuterol (PROVENTIL) (2.5 MG/3ML) 0.083% nebulizer solution 5 mg (5 mg Nebulization Given 11/12/16 1105)  methylPREDNISolone sodium succinate (SOLU-MEDROL) 125 mg/2 mL injection 125 mg (125 mg Intravenous Given 11/12/16 1118)  metoprolol (LOPRESSOR) injection 2.5 mg (2.5 mg Intravenous Given 11/12/16 1142)  albuterol (PROVENTIL) (2.5 MG/3ML) 0.083% nebulizer solution 5 mg (5 mg Nebulization Given 11/12/16 1341)  albuterol (PROVENTIL) (2.5 MG/3ML) 0.083% nebulizer solution 5 mg (5 mg Nebulization Given 11/12/16 1356)  metoprolol (LOPRESSOR) injection 5 mg (5 mg Intravenous Given 11/12/16 1343)    Vitals:   11/12/16 1330 11/12/16 1341 11/12/16 1357 11/12/16 1402  BP: 102/87   103/92  Pulse: 102   110  Resp: (!) 29   (!) 29  Temp:      TempSrc:      SpO2: 94% 91% 98% 100%  Weight:      Height:        Final diagnoses:  Atrial fibrillation with RVR (HCC)  Acute exacerbation of chronic obstructive pulmonary disease (COPD) ([place])    Admission/  observation were discussed with the admitting physician, patient and/or family and they are comfortable with the plan.  Sounds are multilevel  Final Clinical Impressions(s) / ED Diagnoses   Final diagnoses:  Atrial fibrillation with RVR ([place])  Acute exacerbation of chronic obstructive pulmonary disease (COPD) ([place])    New Prescriptions New Prescriptions   No medications on file       Jeffrey Morrison, MD 11/12/16 1448

## 2016-11-12 NOTE — Progress Notes (Signed)
ANTICOAGULATION CONSULT NOTE - Initial Consult  Pharmacy Consult for Warfarin Indication: atrial fibrillation  Allergies  Allergen Reactions  . Ramipril     cough    Patient Measurements: Height: 5\' 11"  (180.3 cm) Weight: (!) 320 lb (145.2 kg) IBW/kg (Calculated) : 75.3  Vital Signs: Temp: 97.6 F (36.4 C) (12/18 1650) Temp Source: Oral (12/18 1650) BP: 120/79 (12/18 1648) Pulse Rate: 90 (12/18 1650)  Labs:  Recent Labs  11/12/16 1059 11/12/16 1103  HGB 15.0  --   HCT 44.6  --   PLT 197  --   LABPROT  --  21.3*  INR  --  1.81  CREATININE 1.06  --   TROPONINI 0.03*  --     Estimated Creatinine Clearance: 108.3 mL/min (by C-G formula based on SCr of 1.06 mg/dL).   Medical History: Past Medical History:  Diagnosis Date  . Afib ([place])   . Coronary artery disease   . History of pneumonia    RML on CXR 07/20/14  . Hyperlipidemia   . Myocardial infarction   . Renal disorder    history kidney stone    Medications:  Prescriptions Prior to Admission  Medication Sig Dispense Refill Last Dose  . albuterol (PROVENTIL) (5 MG/ML) 0.5% nebulizer solution Take 0.5 mLs (2.5 mg total) by nebulization every 6 (six) hours as needed for wheezing or shortness of breath. 20 mL 1 11/12/2016 at 0000  . Albuterol Sulfate (PROAIR RESPICLICK) 123XX123 (90 Base) MCG/ACT AEPB Inhale 1 puff into the lungs every 6 (six) hours as needed (sob). 1 each 1 11/12/2016 at Unknown time  . aspirin 81 MG tablet Take 81 mg by mouth daily.   11/11/2016 at Unknown time  . atorvastatin (LIPITOR) 80 MG tablet Take 1 tablet (80 mg total) by mouth daily at 6 PM. 30 tablet 3 11/12/2016 at 0000  . carvedilol (COREG) 12.5 MG tablet Take 0.5 tablets (6.25 mg total) by mouth 2 (two) times daily with a meal.   11/12/2016 at 0000  . cyclobenzaprine (FLEXERIL) 5 MG tablet Take 1 tablet (5 mg total) by mouth 3 (three) times daily as needed for muscle spasms. 30 tablet 1 unknown  . Fluticasone Furoate (ARNUITY ELLIPTA)  200 MCG/ACT AEPB Inhale 1 puff into the lungs daily. (Patient taking differently: Inhale 1 puff into the lungs daily as needed (shorntess of breath). ) 30 each 12 11/11/2016 at Unknown time  . furosemide (LASIX) 40 MG tablet Take 1 tablet by mouth daily.  0 11/12/2016 at 0000  . losartan (COZAAR) 50 MG tablet Take 50 mg by mouth daily.   11/11/2016 at Unknown time  . potassium chloride SA (K-DUR,KLOR-CON) 20 MEQ tablet Take 10 tablets by mouth daily.  0 11/11/2016 at Unknown time  . spironolactone (ALDACTONE) 25 MG tablet Take 1 tablet (25 mg total) by mouth daily.   11/11/2016 at Unknown time  . Tiotropium Bromide-Olodaterol (STIOLTO RESPIMAT) 2.5-2.5 MCG/ACT AERS Inhale 2 puffs into the lungs daily. 2 Inhaler 2 11/11/2016 at Unknown time  . traMADol (ULTRAM) 50 MG tablet Take 1 tablet (50 mg total) by mouth every 8 (eight) hours as needed. 30 tablet 1 unknown  . warfarin (COUMADIN) 4 MG tablet Take 2-4 mg by mouth daily. Take 4 mg every day except on Sunday and Wednesday take 2 mg.   11/12/2016 at 0000  . nitroGLYCERIN (NITROSTAT) 0.4 MG SL tablet Place 1 tablet (0.4 mg total) under the tongue every 5 (five) minutes x 3 doses as needed for chest pain.  25 tablet 3 unk    Assessment: Pt on chronic Coumadin PTA.  Home dose listed above.  INR below goal on admission. Goal of Therapy:  INR 2-3 Monitor platelets by anticoagulation protocol: Yes   Plan:  Coumadin 4mg  po now x 1 INR daily  [place] Crane, Orville Widmann A 11/12/2016,5:26 PM

## 2016-11-13 ENCOUNTER — Inpatient Hospital Stay (HOSPITAL_COMMUNITY): Payer: BLUE CROSS/BLUE SHIELD

## 2016-11-13 DIAGNOSIS — I5031 Acute diastolic (congestive) heart failure: Secondary | ICD-10-CM

## 2016-11-13 DIAGNOSIS — I252 Old myocardial infarction: Secondary | ICD-10-CM

## 2016-11-13 DIAGNOSIS — I25118 Atherosclerotic heart disease of native coronary artery with other forms of angina pectoris: Secondary | ICD-10-CM

## 2016-11-13 DIAGNOSIS — Z7901 Long term (current) use of anticoagulants: Secondary | ICD-10-CM

## 2016-11-13 DIAGNOSIS — Z955 Presence of coronary angioplasty implant and graft: Secondary | ICD-10-CM

## 2016-11-13 DIAGNOSIS — J189 Pneumonia, unspecified organism: Secondary | ICD-10-CM

## 2016-11-13 DIAGNOSIS — I509 Heart failure, unspecified: Secondary | ICD-10-CM

## 2016-11-13 DIAGNOSIS — Z5181 Encounter for therapeutic drug level monitoring: Secondary | ICD-10-CM

## 2016-11-13 LAB — CBC WITH DIFFERENTIAL/PLATELET
BASOS ABS: 0 10*3/uL (ref 0.0–0.1)
Basophils Relative: 0 %
Eosinophils Absolute: 0 10*3/uL (ref 0.0–0.7)
Eosinophils Relative: 0 %
HEMATOCRIT: 45.5 % (ref 39.0–52.0)
HEMOGLOBIN: 15.2 g/dL (ref 13.0–17.0)
LYMPHS PCT: 8 %
Lymphs Abs: 0.7 10*3/uL (ref 0.7–4.0)
MCH: 29.9 pg (ref 26.0–34.0)
MCHC: 33.4 g/dL (ref 30.0–36.0)
MCV: 89.4 fL (ref 78.0–100.0)
MONO ABS: 0.2 10*3/uL (ref 0.1–1.0)
Monocytes Relative: 2 %
NEUTROS ABS: 8.4 10*3/uL — AB (ref 1.7–7.7)
NEUTROS PCT: 90 %
Platelets: 205 10*3/uL (ref 150–400)
RBC: 5.09 MIL/uL (ref 4.22–5.81)
RDW: 14.7 % (ref 11.5–15.5)
WBC: 9.4 10*3/uL (ref 4.0–10.5)

## 2016-11-13 LAB — ECHOCARDIOGRAM COMPLETE
AO mean calculated velocity dopler: 104 cm/s
AOVTI: 27.1 cm
AV Area mean vel: 2.33 cm2
AV Peak grad: 11 mmHg
AV VEL mean LVOT/AV: 0.74
AV area mean vel ind: 0.84 cm2/m2
AV peak Index: 0.76
AVAREAVTI: 2.11 cm2
AVAREAVTIIND: 0.77 cm2/m2
AVG: 6 mmHg
AVPHT: 192 ms
AVPKVEL: 165 cm/s
Ao pk vel: 0.67 m/s
Area-P 1/2: 1.01 cm2
CHL CUP AV VEL: 2.14
CHL CUP DOP CALC LVOT VTI: 18.5 cm
CHL CUP LVOT MV VTI INDEX: 0.25 cm2/m2
CHL CUP MV M VEL: 174
FS: 21 % — AB (ref 28–44)
Height: 71 in
IVS/LV PW RATIO, ED: 1.09
LA ID, A-P, ES: 58 mm
LA diam index: 2.1 cm/m2
LA vol A4C: 147 ml
LA vol index: 52.1 mL/m2
LA vol: 144 mL
LEFT ATRIUM END SYS DIAM: 58 mm
LV PW d: 14.6 mm — AB (ref 0.6–1.1)
LVOT MV VTI: 0.7
LVOT area: 3.14 cm2
LVOT diameter: 20 mm
LVOT peak VTI: 0.68 cm
LVOT peak grad rest: 5 mmHg
LVOTPV: 111 cm/s
LVOTSV: 58 mL
MVANNULUSVTI: 82.8 cm
MVG: 16 mmHg
MVSPHT: 231 ms
TAPSE: 15.3 mm
Valve area index: 0.77
Valve area: 2.14 cm2
Weight: 5118.55 oz

## 2016-11-13 LAB — BASIC METABOLIC PANEL
ANION GAP: 10 (ref 5–15)
BUN: 26 mg/dL — ABNORMAL HIGH (ref 6–20)
CO2: 25 mmol/L (ref 22–32)
Calcium: 9.2 mg/dL (ref 8.9–10.3)
Chloride: 100 mmol/L — ABNORMAL LOW (ref 101–111)
Creatinine, Ser: 1.19 mg/dL (ref 0.61–1.24)
GFR calc Af Amer: >60 mL/min (ref >60)
GLUCOSE: 173 mg/dL — AB (ref 65–99)
POTASSIUM: 4.2 mmol/L (ref 3.5–5.1)
Sodium: 135 mmol/L (ref 135–145)

## 2016-11-13 LAB — PROTIME-INR
INR: 1.99
Prothrombin Time: 22.9 seconds — ABNORMAL HIGH (ref 11.4–15.2)

## 2016-11-13 LAB — TROPONIN I: Troponin I: 0.03 ng/mL (ref <0.03)

## 2016-11-13 LAB — MAGNESIUM: MAGNESIUM: 1.8 mg/dL (ref 1.7–2.4)

## 2016-11-13 MED ORDER — METOPROLOL TARTRATE 5 MG/5ML IV SOLN
5.0000 mg | Freq: Once | INTRAVENOUS | Status: AC
  Administered 2016-11-13: 5 mg via INTRAVENOUS

## 2016-11-13 MED ORDER — WARFARIN SODIUM 2 MG PO TABS
4.0000 mg | ORAL_TABLET | Freq: Once | ORAL | Status: AC
  Administered 2016-11-13: 4 mg via ORAL
  Filled 2016-11-13: qty 2

## 2016-11-13 MED ORDER — METOPROLOL SUCCINATE ER 25 MG PO TB24
25.0000 mg | ORAL_TABLET | Freq: Two times a day (BID) | ORAL | Status: DC
  Administered 2016-11-13 – 2016-11-15 (×4): 25 mg via ORAL
  Filled 2016-11-13 (×4): qty 1

## 2016-11-13 MED ORDER — SODIUM CHLORIDE 0.9 % IV BOLUS (SEPSIS)
250.0000 mL | Freq: Once | INTRAVENOUS | Status: AC
  Administered 2016-11-13: 250 mL via INTRAVENOUS

## 2016-11-13 MED ORDER — IPRATROPIUM-ALBUTEROL 0.5-2.5 (3) MG/3ML IN SOLN
3.0000 mL | Freq: Three times a day (TID) | RESPIRATORY_TRACT | Status: DC
  Administered 2016-11-14 (×3): 3 mL via RESPIRATORY_TRACT
  Filled 2016-11-13 (×3): qty 3

## 2016-11-13 MED ORDER — HEPARIN (PORCINE) IN NACL 100-0.45 UNIT/ML-% IJ SOLN
1650.0000 [IU]/h | INTRAMUSCULAR | Status: DC
  Administered 2016-11-13: 1650 [IU]/h via INTRAVENOUS
  Filled 2016-11-13: qty 250

## 2016-11-13 MED ORDER — PERFLUTREN LIPID MICROSPHERE
1.0000 mL | INTRAVENOUS | Status: AC | PRN
  Administered 2016-11-13 (×3): 1 mL via INTRAVENOUS
  Administered 2016-11-13: 2 mL via INTRAVENOUS
  Filled 2016-11-13: qty 10

## 2016-11-13 MED ORDER — METOPROLOL TARTRATE 5 MG/5ML IV SOLN
5.0000 mg | Freq: Four times a day (QID) | INTRAVENOUS | Status: DC | PRN
  Administered 2016-11-13: 5 mg via INTRAVENOUS
  Filled 2016-11-13 (×2): qty 5

## 2016-11-13 NOTE — Progress Notes (Signed)
Pt HR elevated to 160's per CCMD. Pt was ambulating to restroom, denies any chest pain. BP 168/140, and HR 95 on dinamap. 5mg  Lopressor PRN given, will reassess and continue to monitor.

## 2016-11-13 NOTE — Progress Notes (Signed)
[facility] for Warfarin Indication: atrial fibrillation  Allergies  Allergen Reactions  . Ramipril     cough   Patient Measurements: Height: 5\' 11"  (180.3 cm) Weight: (!) 319 lb 14.6 oz (145.1 kg) IBW/kg (Calculated) : 75.3  Vital Signs: Temp: 98.5 F (36.9 C) (12/19 0600) Temp Source: Oral (12/19 0600) BP: 103/74 (12/19 0700) Pulse Rate: 74 (12/19 0700)  Labs:  Recent Labs  11/12/16 1059 11/12/16 1103 11/12/16 1912 11/13/16 0101 11/13/16 0453  HGB 15.0  --   --   --  15.2  HCT 44.6  --   --   --  45.5  PLT 197  --   --   --  205  LABPROT  --  21.3*  --   --  22.9*  INR  --  1.81  --   --  1.99  CREATININE 1.06  --   --   --  1.19  TROPONINI 0.03*  --  0.03* 0.03*  --    Estimated Creatinine Clearance: 96.4 mL/min (by C-G formula based on SCr of 1.19 mg/dL).  Medical History: Past Medical History:  Diagnosis Date  . Afib ([place])   . Coronary artery disease   . History of pneumonia    RML on CXR 07/20/14  . Hyperlipidemia   . Myocardial infarction   . Renal disorder    history kidney stone   Medications:  Prescriptions Prior to Admission  Medication Sig Dispense Refill Last Dose  . albuterol (PROVENTIL) (5 MG/ML) 0.5% nebulizer solution Take 0.5 mLs (2.5 mg total) by nebulization every 6 (six) hours as needed for wheezing or shortness of breath. 20 mL 1 11/12/2016 at 0000  . Albuterol Sulfate (PROAIR RESPICLICK) 123XX123 (90 Base) MCG/ACT AEPB Inhale 1 puff into the lungs every 6 (six) hours as needed (sob). 1 each 1 11/12/2016 at Unknown time  . aspirin 81 MG tablet Take 81 mg by mouth daily.   11/11/2016 at Unknown time  . atorvastatin (LIPITOR) 80 MG tablet Take 1 tablet (80 mg total) by mouth daily at 6 PM. 30 tablet 3 11/12/2016 at 0000  . carvedilol (COREG) 12.5 MG tablet Take 0.5 tablets (6.25 mg total) by mouth 2 (two) times daily with a meal.   11/12/2016 at 0000  . cyclobenzaprine (FLEXERIL) 5 MG tablet Take 1 tablet  (5 mg total) by mouth 3 (three) times daily as needed for muscle spasms. 30 tablet 1 unknown  . Fluticasone Furoate (ARNUITY ELLIPTA) 200 MCG/ACT AEPB Inhale 1 puff into the lungs daily. (Patient taking differently: Inhale 1 puff into the lungs daily as needed (shorntess of breath). ) 30 each 12 11/11/2016 at Unknown time  . furosemide (LASIX) 40 MG tablet Take 1 tablet by mouth daily.  0 11/12/2016 at 0000  . losartan (COZAAR) 50 MG tablet Take 50 mg by mouth daily.   11/11/2016 at Unknown time  . potassium chloride SA (K-DUR,KLOR-CON) 20 MEQ tablet Take 10 tablets by mouth daily.  0 11/11/2016 at Unknown time  . spironolactone (ALDACTONE) 25 MG tablet Take 1 tablet (25 mg total) by mouth daily.   11/11/2016 at Unknown time  . Tiotropium Bromide-Olodaterol (STIOLTO RESPIMAT) 2.5-2.5 MCG/ACT AERS Inhale 2 puffs into the lungs daily. 2 Inhaler 2 11/11/2016 at Unknown time  . traMADol (ULTRAM) 50 MG tablet Take 1 tablet (50 mg total) by mouth every 8 (eight) hours as needed. 30 tablet 1 unknown  . warfarin (COUMADIN) 4 MG tablet Take 2-4 mg by  mouth daily. Take 4 mg every day except on Sunday and Wednesday take 2 mg.   11/12/2016 at 0000  . nitroGLYCERIN (NITROSTAT) 0.4 MG SL tablet Place 1 tablet (0.4 mg total) under the tongue every 5 (five) minutes x 3 doses as needed for chest pain. 25 tablet 3 unk   Assessment: Pt on chronic Coumadin PTA.  Home dose listed above.  INR slightly below goal.  Goal of Therapy:  INR 2-3 Monitor platelets by anticoagulation protocol: Yes   Plan:  Coumadin 4mg  po today x 1 INR daily  Dayna Alia A 11/13/2016,8:39 AM

## 2016-11-13 NOTE — Care Management Note (Signed)
Case Management Note  Patient Details  Name: Jeffrey Larson MRN: [id number] Date of Birth: 03-Dec-1955  Subjective/Objective:                  Pt admitted with COPD/CAP. He is from home, lives with his wife and is ind with ADL's. He has PCP, drives himself to appointments. He is self employed FT. He has insurance with drug coverage. He does not have oxygen or any assistive devices for ambulation. He does have neb machine.   Action/Plan: He plans to return home with self care at DC. No CM needs anticipated.   Expected Discharge Date:  11/14/16               Expected Discharge Plan:  Home/Self Care  In-House Referral:  NA  Discharge planning Services  CM Consult  Post Acute Care Choice:  NA Choice offered to:  NA  Status of Service:  Completed, signed off   Sherald Barge, RN 11/13/2016, 1:23 PM

## 2016-11-13 NOTE — Consult Note (Signed)
CARDIOLOGY CONSULT NOTE  Patient ID: Jeffrey Larson MRN: [id number] DOB/AGE: [birth year] 59 y.o.  Admit date: 11/12/2016 Primary Physician: [name], DO Referring Physician:   Reason for Consultation: CHF, a fib, CAD, mitral stenosis  HPI: 60 yr old male with h/o CAD and prior stents, chronic atrial fibrillation, ischemic cardiomyopathy/chronic systolic and diastolic heart failure, and mitral stenosis, who is followed by Dr. [name] every 3 months. He recently saw him and had been doing fairly well, although he has been experiencing progressive exertional dyspnea. For the past few days, his exertional dyspnea had been getting worse and he developed worsening bilateral leg edema.  Denies chest pain.  He was admitted with acute on chronic systolic and diastolic heart failure and has been on IV diuretics since yesterday with symptomatic improvement.  He still has significant dyspnea when walking from the bed to the bathroom.  Telemetry shows rapid atrial fibrillation.  Echocardiogram performed today which I interpreted shows normal LV systolic function, EF 123456, moderate LVH, severe mitral stenosis (mean gradient 16 mmHg), and severe left atrial enlargement.    Allergies  Allergen Reactions  . Ramipril     cough    Current Facility-Administered Medications  Medication Dose Route Frequency Provider Last Rate Last Dose  . 0.9 %  sodium chloride infusion  250 mL Intravenous PRN Thurnell Lose, MD      . acetaminophen (TYLENOL) tablet 650 mg  650 mg Oral Q4H PRN Thurnell Lose, MD      . albuterol (PROVENTIL) (2.5 MG/3ML) 0.083% nebulizer solution 2.5 mg  2.5 mg Nebulization Q4H PRN Thurnell Lose, MD      . albuterol (PROVENTIL) (2.5 MG/3ML) 0.083% nebulizer solution 2.5 mg  2.5 mg Nebulization Q6H PRN Thurnell Lose, MD      . aspirin EC tablet 81 mg  81 mg Oral Daily Thurnell Lose, MD   81 mg at 11/13/16 0908  . atorvastatin (LIPITOR) tablet 80 mg   80 mg Oral q1800 Thurnell Lose, MD   80 mg at 11/12/16 1834  . budesonide (PULMICORT) nebulizer solution 0.5 mg  0.5 mg Nebulization BID PRN Thurnell Lose, MD      . carvedilol (COREG) tablet 12.5 mg  12.5 mg Oral BID WC Thurnell Lose, MD   12.5 mg at 11/13/16 0908  . cyclobenzaprine (FLEXERIL) tablet 5 mg  5 mg Oral TID PRN Thurnell Lose, MD      . furosemide (LASIX) injection 60 mg  60 mg Intravenous BID Thurnell Lose, MD   60 mg at 11/13/16 0920  . ipratropium-albuterol (DUONEB) 0.5-2.5 (3) MG/3ML nebulizer solution 3 mL  3 mL Nebulization Q6H Thurnell Lose, MD   3 mL at 11/13/16 1413  . levofloxacin (LEVAQUIN) IVPB 750 mg  750 mg Intravenous Q24H [name], MD 100 mL/hr at 11/12/16 1510 750 mg at 11/12/16 1510  . lisinopril (PRINIVIL,ZESTRIL) tablet 5 mg  5 mg Oral Daily Thurnell Lose, MD   5 mg at 11/13/16 0908  . methylPREDNISolone sodium succinate (SOLU-MEDROL) 125 mg/2 mL injection 60 mg  60 mg Intravenous Q12H Thurnell Lose, MD   60 mg at 11/13/16 0910  . metoprolol (LOPRESSOR) injection 5 mg  5 mg Intravenous Q4H PRN Thurnell Lose, MD   5 mg at 11/13/16 0646  . metoprolol (LOPRESSOR) injection 5 mg  5 mg Intravenous Q6H PRN Erline Hau, MD      . nitroGLYCERIN (  NITROSTAT) SL tablet 0.4 mg  0.4 mg Sublingual Q5 Min x 3 PRN Thurnell Lose, MD      . ondansetron [facility] injection 4 mg  4 mg Intravenous Q6H PRN Thurnell Lose, MD      . potassium chloride SA (K-DUR,KLOR-CON) CR tablet 40 mEq  40 mEq Oral Daily Thurnell Lose, MD   40 mEq at 11/13/16 0907  . sodium chloride flush (NS) 0.9 % injection 3 mL  3 mL Intravenous Q12H Thurnell Lose, MD   3 mL at 11/13/16 1000  . sodium chloride flush (NS) 0.9 % injection 3 mL  3 mL Intravenous PRN Thurnell Lose, MD      . spironolactone (ALDACTONE) tablet 25 mg  25 mg Oral Daily Thurnell Lose, MD   25 mg at 11/13/16 0907  . warfarin (COUMADIN) tablet 4 mg  4 mg Oral Once Erline Hau, MD      . Warfarin - Pharmacist Dosing Inpatient   Does not apply Q24H Thurnell Lose, MD        Past Medical History:  Diagnosis Date  . Afib ([place])   . Coronary artery disease   . History of pneumonia    RML on CXR 07/20/14  . Hyperlipidemia   . Myocardial infarction   . Renal disorder    history kidney stone    Past Surgical History:  Procedure Laterality Date  . APPENDECTOMY    . CARDIOVASCULAR STRESS TEST  03/2014   Borderline reversible ischemic changes at the apex.  Normal LV contractility/EF 52%.  . CORONARY STENT PLACEMENT  7/12013  . LEFT HEART CATHETERIZATION WITH CORONARY ANGIOGRAM N/A 06/12/2012   Procedure: LEFT HEART CATHETERIZATION WITH CORONARY ANGIOGRAM;  Surgeon: Clent Demark, MD;  Location: [facility] CATH LAB;  Service: Cardiovascular;  Laterality: N/A;  . PERCUTANEOUS CORONARY STENT INTERVENTION (PCI-S) N/A 06/17/2012   Procedure: PERCUTANEOUS CORONARY STENT INTERVENTION (PCI-S);  Surgeon: Clent Demark, MD;  Location: [facility] CATH LAB;  Service: Cardiovascular;  Laterality: N/A;    Social History   Social History  . Marital status: Married    Spouse name: N/A  . Number of children: N/A  . Years of education: N/A   Occupational History  . Not on file.   Social History Main Topics  . Smoking status: Former Smoker    Years: 0.00    Types: Cigarettes    Quit date: 11/26/1996  . Smokeless tobacco: Never Used  . Alcohol use No  . Drug use: No  . Sexual activity: Yes   Other Topics Concern  . Not on file   Social History Narrative  . No narrative on file     No family history of premature CAD in 1st degree relatives.  Prior to Admission medications   Medication Sig Start Date End Date Taking? Authorizing Provider  albuterol (PROVENTIL) (5 MG/ML) 0.5% nebulizer solution Take 0.5 mLs (2.5 mg total) by nebulization every 6 (six) hours as needed for wheezing or shortness of breath. 07/26/16  Yes Silverio Decamp, MD  Albuterol Sulfate (PROAIR  RESPICLICK) 123XX123 (90 Base) MCG/ACT AEPB Inhale 1 puff into the lungs every 6 (six) hours as needed (sob). 12/22/15  Yes Gregor Hams, MD  aspirin 81 MG tablet Take 81 mg by mouth daily.   Yes Historical Provider, MD  atorvastatin (LIPITOR) 80 MG tablet Take 1 tablet (80 mg total) by mouth daily at 6 PM. 06/18/12 11/12/16 Yes [name], MD  carvedilol (  COREG) 12.5 MG tablet Take 0.5 tablets (6.25 mg total) by mouth 2 (two) times daily with a meal. 10/11/14  Yes Sean Hommel, DO  cyclobenzaprine (FLEXERIL) 5 MG tablet Take 1 tablet (5 mg total) by mouth 3 (three) times daily as needed for muscle spasms. 05/21/16  Yes Sean Hommel, DO  Fluticasone Furoate (ARNUITY ELLIPTA) 200 MCG/ACT AEPB Inhale 1 puff into the lungs daily. Patient taking differently: Inhale 1 puff into the lungs daily as needed (shorntess of breath).  03/20/16  Yes Gregor Hams, MD  furosemide (LASIX) 40 MG tablet Take 1 tablet by mouth daily. 09/26/16  Yes Historical Provider, MD  losartan (COZAAR) 50 MG tablet Take 50 mg by mouth daily.   Yes Historical Provider, MD  potassium chloride SA (K-DUR,KLOR-CON) 20 MEQ tablet Take 10 tablets by mouth daily. 09/16/16  Yes Historical Provider, MD  spironolactone (ALDACTONE) 25 MG tablet Take 1 tablet (25 mg total) by mouth daily. 08/12/14  Yes Sean Hommel, DO  Tiotropium Bromide-Olodaterol (STIOLTO RESPIMAT) 2.5-2.5 MCG/ACT AERS Inhale 2 puffs into the lungs daily. 11/06/16  Yes [name], DO  traMADol (ULTRAM) 50 MG tablet Take 1 tablet (50 mg total) by mouth every 8 (eight) hours as needed. 05/21/16  Yes Sean Hommel, DO  warfarin (COUMADIN) 4 MG tablet Take 2-4 mg by mouth daily. Take 4 mg every day except on Sunday and Wednesday take 2 mg.   Yes Historical Provider, MD  nitroGLYCERIN (NITROSTAT) 0.4 MG SL tablet Place 1 tablet (0.4 mg total) under the tongue every 5 (five) minutes x 3 doses as needed for chest pain. 06/18/12 07/20/14  [name], MD     Review of systems complete and  found to be negative unless listed above in HPI     Physical exam Blood pressure 105/73, pulse 75, temperature 98.2 F (36.8 C), temperature source Oral, resp. rate 20, height 5\' 11"  (1.803 m), weight (!) 319 lb 14.6 oz (145.1 kg), SpO2 95 %. General: NAD Neck: No JVD, no thyromegaly or thyroid nodule.  Lungs: Clear to auscultation bilaterally with normal respiratory effort. CV: Nondisplaced PMI. Regular rate and rhythm, normal S1/S2, no S3/S4, no murmur.  No peripheral edema.  No carotid bruit.  Normal pedal pulses.  Abdomen: Soft, nontender, no hepatosplenomegaly, no distention.  Skin: Intact without lesions or rashes.  Neurologic: Alert and oriented x 3.  Psych: Normal affect. Extremities: No clubbing or cyanosis.  HEENT: Normal.   ECG: Most recent ECG reviewed.  Labs:   Lab Results  Component Value Date   WBC 9.4 11/13/2016   HGB 15.2 11/13/2016   HCT 45.5 11/13/2016   MCV 89.4 11/13/2016   PLT 205 11/13/2016    Recent Labs Lab 11/12/16 1059 11/13/16 0453  NA 134* 135  K 4.3 4.2  CL 99* 100*  CO2 23 25  BUN 18 26*  CREATININE 1.06 1.19  CALCIUM 8.9 9.2  PROT 7.3  --   BILITOT 1.4*  --   ALKPHOS 51  --   ALT 21  --   AST 23  --   GLUCOSE 125* 173*   Lab Results  Component Value Date   CKTOTAL 90 06/17/2012   CKMB 2.4 06/17/2012   TROPONINI 0.03 (HH) 11/13/2016   No results found for: CHOL No results found for: HDL No results found for: LDLCALC No results found for: TRIG No results found for: CHOLHDL No results found for: LDLDIRECT       Studies: Dg Chest 2 View  Result  Date: 11/12/2016 CLINICAL DATA:  Shortness of breath and chest congestion worsening over the past 2 weeks. History of atrial fibrillation. Coronary artery disease with stent placement, previous MI, COPD, former smoker. EXAM: CHEST  2 VIEW COMPARISON:  PA and lateral chest x-ray of November 06, 2016. FINDINGS: The lungs are well-expanded. The interstitial markings are coarse and  slightly more conspicuous in the lower lobes today than on the previous study. There is no alveolar infiltrate. There is no pleural effusion. The cardiac silhouette is enlarged. The pulmonary vascularity is prominent centrally and slightly more conspicuous than on the previous study. The mediastinum is normal in width. There is mild multilevel degenerative disc disease of the thoracic spine. IMPRESSION: Cardiomegaly with mild central pulmonary vascular congestion and mild interstitial edema. Underlying COPD. No alveolar pneumonia. Electronically Signed   By: David  Martinique M.D.   On: 11/12/2016 12:01    ASSESSMENT AND PLAN:  1. Severe mitral stenosis: This is the etiology of both acute diastolic heart failure and rapid atrial fibrillation. He requires percutaneous mitral balloon valvotomy. Once he is stable from a heart failure standpoint, I recommend he follow up with his cardiologist for further management. He will require TEE to exclude left atrial appendage thrombus and coronary angiography.  For now, continue diuresis and control of rapid atrial fibrillation.  2. Acute diastolic heart failure: Currently diuresing well with IV Lasix 60 mg bid and spironolactone 25 mg. I doubt I/O accuracy. Symptomatically he is improving. This is being driven by rapid atrial fibrillation which is in turn being driven by severe mitral stenosis.  3. Rapid atrial fibrillation: Anticoagulated with warfarin, INR subtherapeutic today (1.99). He is not on heparin. I will order IV heparin with pharmacy to manage as he is at high risk for left atrial appendage thrombus. HR is suboptimally controlled. Currently on Coreg 12.5 mg bid with low normal BP. I will switch to Toprol-XL 25 mg bid. IV digoxin can also be used.  4. CAD: Symptomatically stable. Continue ASA, beta blocker, and Lipitor.    Signed: Kate Sable, M.D., F.A.C.C.  11/13/2016, 2:36 PM

## 2016-11-13 NOTE — Progress Notes (Signed)
*  PRELIMINARY RESULTS* Echocardiogram 2D Echocardiogram has been performed with Definity.  Samuel Germany 11/13/2016, 12:23 PM

## 2016-11-13 NOTE — Progress Notes (Addendum)
PROGRESS NOTE    [address]  [id number] DOB: Sep 04, 1956 DOA: 11/12/2016 PCP: [name], DO     Brief Narrative:  60 y/o man admitted 12/18 from home with progressive SOB, LE edema and orthopnea. Known to have combined CHF with EF of 50%.   Assessment & Plan:   Principal Problem:   Acute on chronic combined systolic and diastolic CHF (congestive heart failure) (HCC) Active Problems:   History of MI (myocardial infarction)   Essential hypertension, benign   Acute exacerbation of chronic obstructive pulmonary disease (COPD) (HCC)   Acute Hypoxemic Respiratory Failure -Due to acute on chronic combined CHF. -Repeat ECHO pending. -Continue lasix 60 IV BID. -Strive for negative fluid balance (I do not believe Is and Os are being documented appropriately). -Continue ASA, statin, coreg, ACE-I  A Fib -Rates have been uncontrolled at times into 140, but sustaining around 120. -Will try to manage for now without IV drips with coreg and PRN lopressor dosing. -Anticoagulated on coumadin.  HTN -Well controlled, continue home meds.  Hyperlipidemia -Continue statin.  CAD -with h/o PTCA. -Currently CP free.   DVT prophylaxis: coumadin Code Status: full code Family Communication: patient only Disposition Plan: anticipate DC home in 48-72 hours  Consultants:   None  Procedures:   None  Antimicrobials:  Anti-infectives    Start     Dose/Rate Route Frequency Ordered Stop   11/12/16 1500  levofloxacin (LEVAQUIN) IVPB 750 mg  Status:  Discontinued     750 mg 100 mL/hr over 90 Minutes Intravenous Every 24 hours 11/12/16 1448 11/13/16 1441       Subjective: Feels much better, less SOB and less swelling of his legs.  Objective: Vitals:   11/13/16 0700 11/13/16 0854 11/13/16 1228 11/13/16 1413  BP: 103/74  105/73   Pulse: 74  75   Resp:   20   Temp:   98.2 F (36.8 C)   TempSrc:   Oral   SpO2:  95% 95% 95%  Weight:      Height:         Intake/Output Summary (Last 24 hours) at 11/13/16 1441 Last data filed at 11/13/16 1200  Gross per 24 hour  Intake              870 ml  Output              600 ml  Net              270 ml   Filed Weights   11/12/16 1020 11/12/16 1648 11/13/16 0634  Weight: (!) 145.6 kg (321 lb) (!) 145.2 kg (320 lb) (!) 145.1 kg (319 lb 14.6 oz)    Examination:  General exam: Alert, awake, oriented x 3 Respiratory system: Bilateral basilar crackles Cardiovascular system:RRR. No murmurs, rubs, gallops. Gastrointestinal system: Abdomen is nondistended, soft and nontender. No organomegaly or masses felt. Normal bowel sounds heard. Central nervous system: Alert and oriented. No focal neurological deficits. Extremities: 2+ bilateral pedal edema Skin: No rashes, lesions or ulcers Psychiatry: Judgement and insight appear normal. Mood & affect appropriate.     Data Reviewed: I have personally reviewed following labs and imaging studies  CBC:  Recent Labs Lab 11/12/16 1059 11/13/16 0453  WBC 12.0* 9.4  NEUTROABS 10.5* 8.4*  HGB 15.0 15.2  HCT 44.6 45.5  MCV 89.4 89.4  PLT 197 99991111   Basic Metabolic Panel:  Recent Labs Lab 11/12/16 1059 11/13/16 0453  NA 134* 135  K 4.3 4.2  CL  99* 100*  CO2 23 25  GLUCOSE 125* 173*  BUN 18 26*  CREATININE 1.06 1.19  CALCIUM 8.9 9.2  MG  --  1.8   GFR: Estimated Creatinine Clearance: 96.4 mL/min (by C-G formula based on SCr of 1.19 mg/dL). Liver Function Tests:  Recent Labs Lab 11/12/16 1059  AST 23  ALT 21  ALKPHOS 51  BILITOT 1.4*  PROT 7.3  ALBUMIN 4.0   No results for input(s): LIPASE, AMYLASE in the last 168 hours. No results for input(s): AMMONIA in the last 168 hours. Coagulation Profile:  Recent Labs Lab 11/12/16 1103 11/13/16 0453  INR 1.81 1.99   Cardiac Enzymes:  Recent Labs Lab 11/12/16 1059 11/12/16 1912 11/13/16 0101  TROPONINI 0.03* 0.03* 0.03*   BNP (last 3 results) No results for input(s): PROBNP in  the last 8760 hours. HbA1C: No results for input(s): HGBA1C in the last 72 hours. CBG: No results for input(s): GLUCAP in the last 168 hours. Lipid Profile: No results for input(s): CHOL, HDL, LDLCALC, TRIG, CHOLHDL, LDLDIRECT in the last 72 hours. Thyroid Function Tests: No results for input(s): TSH, T4TOTAL, FREET4, T3FREE, THYROIDAB in the last 72 hours. Anemia Panel: No results for input(s): VITAMINB12, FOLATE, FERRITIN, TIBC, IRON, RETICCTPCT in the last 72 hours. Urine analysis:    Component Value Date/Time   BILIRUBINUR NEGATIVE 04/30/2016 1516   KETONESUR negative 01/15/2015 1647   PROTEINUR 30MG  04/30/2016 1516   UROBILINOGEN 4.0 04/30/2016 1516   NITRITE NEGATIVE 04/30/2016 1516   LEUKOCYTESUR Negative 04/30/2016 1516   Sepsis Labs: @LABRCNTIP (procalcitonin:4,lacticidven:4)  )No results found for this or any previous visit (from the past 240 hour(s)).       Radiology Studies: Dg Chest 2 View  Result Date: 11/12/2016 CLINICAL DATA:  Shortness of breath and chest congestion worsening over the past 2 weeks. History of atrial fibrillation. Coronary artery disease with stent placement, previous MI, COPD, former smoker. EXAM: CHEST  2 VIEW COMPARISON:  PA and lateral chest x-ray of November 06, 2016. FINDINGS: The lungs are well-expanded. The interstitial markings are coarse and slightly more conspicuous in the lower lobes today than on the previous study. There is no alveolar infiltrate. There is no pleural effusion. The cardiac silhouette is enlarged. The pulmonary vascularity is prominent centrally and slightly more conspicuous than on the previous study. The mediastinum is normal in width. There is mild multilevel degenerative disc disease of the thoracic spine. IMPRESSION: Cardiomegaly with mild central pulmonary vascular congestion and mild interstitial edema. Underlying COPD. No alveolar pneumonia. Electronically Signed   By: David  Martinique M.D.   On: 11/12/2016 12:01         Scheduled Meds: . aspirin EC  81 mg Oral Daily  . atorvastatin  80 mg Oral q1800  . carvedilol  12.5 mg Oral BID WC  . furosemide  60 mg Intravenous BID  . ipratropium-albuterol  3 mL Nebulization Q6H  . lisinopril  5 mg Oral Daily  . methylPREDNISolone sodium succinate  60 mg Intravenous Q12H  . potassium chloride SA  40 mEq Oral Daily  . sodium chloride flush  3 mL Intravenous Q12H  . spironolactone  25 mg Oral Daily  . warfarin  4 mg Oral Once  . Warfarin - Pharmacist Dosing Inpatient   Does not apply Q24H   Continuous Infusions:   LOS: 1 day    Time spent: 25 minutes. Greater than 50% of this time was spent in direct contact with the patient coordinating care.  Lelon Frohlich, MD [facility] Pager [phone]  If 7PM-7AM, please contact night-coverage [email or web address] Password [email or web address] 11/13/2016, 2:41 PM

## 2016-11-13 NOTE — Progress Notes (Signed)
[facility] for Warfarin >> Heparin bridge until INR therapeutic Indication: atrial fibrillation  Allergies  Allergen Reactions  . Ramipril     cough   Patient Measurements: Height: 5\' 11"  (180.3 cm) Weight: (!) 319 lb 14.6 oz (145.1 kg) IBW/kg (Calculated) : 75.3  HEPARIN DW (KG): 109.4  Vital Signs: Temp: 98.2 F (36.8 C) (12/19 1228) Temp Source: Oral (12/19 1228) BP: 103/62 (12/19 1448) Pulse Rate: 127 (12/19 1448)  Labs:  Recent Labs  11/12/16 1059 11/12/16 1103 11/12/16 1912 11/13/16 0101 11/13/16 0453  HGB 15.0  --   --   --  15.2  HCT 44.6  --   --   --  45.5  PLT 197  --   --   --  205  LABPROT  --  21.3*  --   --  22.9*  INR  --  1.81  --   --  1.99  CREATININE 1.06  --   --   --  1.19  TROPONINI 0.03*  --  0.03* 0.03*  --    Estimated Creatinine Clearance: 96.4 mL/min (by C-G formula based on SCr of 1.19 mg/dL).  Medical History: Past Medical History:  Diagnosis Date  . Afib ([place])   . Coronary artery disease   . History of pneumonia    RML on CXR 07/20/14  . Hyperlipidemia   . Myocardial infarction   . Renal disorder    history kidney stone   Medications:  Prescriptions Prior to Admission  Medication Sig Dispense Refill Last Dose  . albuterol (PROVENTIL) (5 MG/ML) 0.5% nebulizer solution Take 0.5 mLs (2.5 mg total) by nebulization every 6 (six) hours as needed for wheezing or shortness of breath. 20 mL 1 11/12/2016 at 0000  . Albuterol Sulfate (PROAIR RESPICLICK) 123XX123 (90 Base) MCG/ACT AEPB Inhale 1 puff into the lungs every 6 (six) hours as needed (sob). 1 each 1 11/12/2016 at Unknown time  . aspirin 81 MG tablet Take 81 mg by mouth daily.   11/11/2016 at Unknown time  . atorvastatin (LIPITOR) 80 MG tablet Take 1 tablet (80 mg total) by mouth daily at 6 PM. 30 tablet 3 11/12/2016 at 0000  . carvedilol (COREG) 12.5 MG tablet Take 0.5 tablets (6.25 mg total) by mouth 2 (two) times daily with a meal.   11/12/2016  at 0000  . cyclobenzaprine (FLEXERIL) 5 MG tablet Take 1 tablet (5 mg total) by mouth 3 (three) times daily as needed for muscle spasms. 30 tablet 1 unknown  . Fluticasone Furoate (ARNUITY ELLIPTA) 200 MCG/ACT AEPB Inhale 1 puff into the lungs daily. (Patient taking differently: Inhale 1 puff into the lungs daily as needed (shorntess of breath). ) 30 each 12 11/11/2016 at Unknown time  . furosemide (LASIX) 40 MG tablet Take 1 tablet by mouth daily.  0 11/12/2016 at 0000  . losartan (COZAAR) 50 MG tablet Take 50 mg by mouth daily.   11/11/2016 at Unknown time  . potassium chloride SA (K-DUR,KLOR-CON) 20 MEQ tablet Take 10 tablets by mouth daily.  0 11/11/2016 at Unknown time  . spironolactone (ALDACTONE) 25 MG tablet Take 1 tablet (25 mg total) by mouth daily.   11/11/2016 at Unknown time  . Tiotropium Bromide-Olodaterol (STIOLTO RESPIMAT) 2.5-2.5 MCG/ACT AERS Inhale 2 puffs into the lungs daily. 2 Inhaler 2 11/11/2016 at Unknown time  . traMADol (ULTRAM) 50 MG tablet Take 1 tablet (50 mg total) by mouth every 8 (eight) hours as needed. 30 tablet 1 unknown  .  warfarin (COUMADIN) 4 MG tablet Take 2-4 mg by mouth daily. Take 4 mg every day except on Sunday and Wednesday take 2 mg.   11/12/2016 at 0000  . nitroGLYCERIN (NITROSTAT) 0.4 MG SL tablet Place 1 tablet (0.4 mg total) under the tongue every 5 (five) minutes x 3 doses as needed for chest pain. 25 tablet 3 unk   Assessment: Pt on chronic Coumadin PTA.  Home dose listed above.  INR slightly below goal.  Asked to initiate Heparin until INR therapeutic.    Goal of Therapy:  INR 2-3 Monitor platelets by anticoagulation protocol: Yes   Plan:  Coumadin 4mg  po today x 1 Heparin infusion at 1650 units/hr (no bolus due to INR 1.99) INR, Heparin level and CBC daily Monitor for s/sx bleeding complications  Hart Robinsons A 11/13/2016,3:50 PM

## 2016-11-14 DIAGNOSIS — I4891 Unspecified atrial fibrillation: Secondary | ICD-10-CM

## 2016-11-14 LAB — CBC
HCT: 44.6 % (ref 39.0–52.0)
HEMOGLOBIN: 14.6 g/dL (ref 13.0–17.0)
MCH: 29.4 pg (ref 26.0–34.0)
MCHC: 32.7 g/dL (ref 30.0–36.0)
MCV: 89.9 fL (ref 78.0–100.0)
Platelets: 240 10*3/uL (ref 150–400)
RBC: 4.96 MIL/uL (ref 4.22–5.81)
RDW: 15.1 % (ref 11.5–15.5)
WBC: 16.9 10*3/uL — ABNORMAL HIGH (ref 4.0–10.5)

## 2016-11-14 LAB — BASIC METABOLIC PANEL
ANION GAP: 9 (ref 5–15)
BUN: 40 mg/dL — ABNORMAL HIGH (ref 6–20)
CALCIUM: 8.9 mg/dL (ref 8.9–10.3)
CO2: 27 mmol/L (ref 22–32)
Chloride: 98 mmol/L — ABNORMAL LOW (ref 101–111)
Creatinine, Ser: 1.33 mg/dL — ABNORMAL HIGH (ref 0.61–1.24)
GFR calc non Af Amer: 57 mL/min — ABNORMAL LOW (ref >60)
Glucose, Bld: 136 mg/dL — ABNORMAL HIGH (ref 65–99)
Potassium: 4.4 mmol/L (ref 3.5–5.1)
Sodium: 134 mmol/L — ABNORMAL LOW (ref 135–145)

## 2016-11-14 LAB — PROTIME-INR
INR: 3.24
PROTHROMBIN TIME: 33.8 s — AB (ref 11.4–15.2)

## 2016-11-14 LAB — HEPARIN LEVEL (UNFRACTIONATED): HEPARIN UNFRACTIONATED: 0.39 [IU]/mL (ref 0.30–0.70)

## 2016-11-14 MED ORDER — FUROSEMIDE 40 MG PO TABS
40.0000 mg | ORAL_TABLET | Freq: Every day | ORAL | Status: DC
  Administered 2016-11-15: 40 mg via ORAL
  Filled 2016-11-14: qty 1

## 2016-11-14 MED ORDER — IPRATROPIUM-ALBUTEROL 0.5-2.5 (3) MG/3ML IN SOLN
3.0000 mL | Freq: Two times a day (BID) | RESPIRATORY_TRACT | Status: DC
  Administered 2016-11-15: 3 mL via RESPIRATORY_TRACT
  Filled 2016-11-14: qty 3

## 2016-11-14 MED ORDER — DILTIAZEM HCL 25 MG/5ML IV SOLN
15.0000 mg | Freq: Once | INTRAVENOUS | Status: AC
  Administered 2016-11-14: 15 mg via INTRAVENOUS
  Filled 2016-11-14: qty 5

## 2016-11-14 MED ORDER — METOPROLOL TARTRATE 5 MG/5ML IV SOLN
5.0000 mg | Freq: Four times a day (QID) | INTRAVENOUS | Status: DC | PRN
  Administered 2016-11-14 (×2): 5 mg via INTRAVENOUS
  Filled 2016-11-14 (×2): qty 5

## 2016-11-14 NOTE — Progress Notes (Addendum)
Cardiologist: Dr. [name]  Subjective:  Feeling better. Says he put a lot of fluid out.  Objective:  Vital Signs in the last 24 hours: Temp:  [97.5 F (36.4 C)-98.2 F (36.8 C)] 97.5 F (36.4 C) (12/20 0523) Pulse Rate:  [54-127] 81 (12/20 0824) Resp:  [20] 20 (12/20 0523) BP: (84-114)/(47-76) 114/76 (12/20 0824) SpO2:  [94 %-98 %] 98 % (12/20 0738) Weight:  [307 lb (139.3 kg)] 307 lb (139.3 kg) (12/20 0523)  Intake/Output from previous day: 12/19 0701 - 12/20 0700 In: 761.4 [P.O.:600; I.V.:161.4] Out: 1725 [Urine:1725] Intake/Output from this shift: No intake/output data recorded.  Physical Exam: NECK: increased JVD, HJR, or bruit LUNGS: Decreased breath sounds with coughing and crackles HEART: Irregular rate and rhythm, no murmur, gallop, rub, bruit, thrill, or heave EXTREMITIES: 1-2 plus edemaWithout cyanosis, clubbing    Lab Results:  Recent Labs  11/13/16 0453 11/14/16 0410  WBC 9.4 16.9*  HGB 15.2 14.6  PLT 205 240    Recent Labs  11/13/16 0453 11/14/16 0410  NA 135 134*  K 4.2 4.4  CL 100* 98*  CO2 25 27  GLUCOSE 173* 136*  BUN 26* 40*  CREATININE 1.19 1.33*    Recent Labs  11/12/16 1912 11/13/16 0101  TROPONINI 0.03* 0.03*   Hepatic Function Panel  Recent Labs  11/12/16 1059  PROT 7.3  ALBUMIN 4.0  AST 23  ALT 21  ALKPHOS 51  BILITOT 1.4*   No results for input(s): CHOL in the last 72 hours. No results for input(s): PROTIME in the last 72 hours.  Imaging:   Cardiac Studies:  Assessment/Plan:  1. Severe mitral stenosis: This is the etiology of both acute diastolic heart failure and rapid atrial fibrillation. He requires percutaneous mitral balloon valvotomy. Once he is stable from a heart failure standpoint, I recommend he follow up with his cardiologist for further management. He will require TEE to exclude left atrial appendage thrombus and coronary angiography.  For now, continue diuresis and control of rapid atrial  fibrillation.   2. Acute diastolic heart failure: Currently diuresing well with IV Lasix 60 mg bid and spironolactone 25 mg. I/O's negative 963 but weight down 13 lbs. 320 to 307 lbs. Symptomatically he is improving. This is being driven by rapid atrial fibrillation which is in turn being driven by severe mitral stenosis. Crt up to 1.33. Still has fluid on board but may have to back off on diuretics or stop lisinopril that was started yesterday.   3. Rapid atrial fibrillation: Anticoagulated with warfarin, INR subtherapeutic today (1.99). He is on heparin with pharmacy to manage as he is at high risk for left atrial appendage thrombus. HR is suboptimally controlled.  Coreg 12.5 mg bid  switched to Toprol-XL 25 mg bid. IV digoxin can also be used. HR up to 115 now because he's moving around.   4. CAD: Symptomatically stable. Continue ASA, beta blocker, and Lipitor.       LOS: 2 days    Ermalinda Barrios 11/14/2016, 9:23 AM  Pt seen and examined. Please refer to my full consult note dated 11/13/16. He is feeling much better. HR controlled during my exam. Diuresing well. Hopefully home tomorrow.

## 2016-11-14 NOTE — Progress Notes (Signed)
At 2110 MD paged about pt HR increasing to the 150's. Pt given one time dose of Metroprolol 5 mg IV. HR continues to be elevated between 110's and 150's. MD paged again with orders to give Cardizem 15 mg IV at 0015. HR in high 90's to 110. Will continue to monitor.

## 2016-11-14 NOTE — Progress Notes (Signed)
[facility] for Warfarin Indication: atrial fibrillation  Allergies  Allergen Reactions  . Ramipril     cough   Patient Measurements: Height: 5\' 11"  (180.3 cm) Weight: (!) 307 lb (139.3 kg) IBW/kg (Calculated) : 75.3  HEPARIN DW (KG): 109.4  Vital Signs: Temp: 97.5 F (36.4 C) (12/20 0523) Temp Source: Oral (12/20 0523) BP: 107/72 (12/20 0523) Pulse Rate: 108 (12/20 0523)  Labs:  Recent Labs  11/12/16 1059 11/12/16 1103 11/12/16 1912 11/13/16 0101 11/13/16 0453 11/14/16 0410  HGB 15.0  --   --   --  15.2 14.6  HCT 44.6  --   --   --  45.5 44.6  PLT 197  --   --   --  205 240  LABPROT  --  21.3*  --   --  22.9* 33.8*  INR  --  1.81  --   --  1.99 3.24  HEPARINUNFRC  --   --   --   --   --  0.39  CREATININE 1.06  --   --   --  1.19 1.33*  TROPONINI 0.03*  --  0.03* 0.03*  --   --    Estimated Creatinine Clearance: 84.3 mL/min (by C-G formula based on SCr of 1.33 mg/dL (H)).  Medical History: Past Medical History:  Diagnosis Date  . Afib ([place])   . Coronary artery disease   . History of pneumonia    RML on CXR 07/20/14  . Hyperlipidemia   . Myocardial infarction   . Renal disorder    history kidney stone   Medications:  Prescriptions Prior to Admission  Medication Sig Dispense Refill Last Dose  . albuterol (PROVENTIL) (5 MG/ML) 0.5% nebulizer solution Take 0.5 mLs (2.5 mg total) by nebulization every 6 (six) hours as needed for wheezing or shortness of breath. 20 mL 1 11/12/2016 at 0000  . Albuterol Sulfate (PROAIR RESPICLICK) [id number] (90 Base) MCG/ACT AEPB Inhale 1 puff into the lungs every 6 (six) hours as needed (sob). 1 each 1 11/12/2016 at Unknown time  . aspirin 81 MG tablet Take 81 mg by mouth daily.   11/11/2016 at Unknown time  . atorvastatin (LIPITOR) 80 MG tablet Take 1 tablet (80 mg total) by mouth daily at 6 PM. 30 tablet 3 11/12/2016 at 0000  . carvedilol (COREG) 12.5 MG tablet Take 0.5 tablets (6.25 mg total) by  mouth 2 (two) times daily with a meal.   11/12/2016 at 0000  . cyclobenzaprine (FLEXERIL) 5 MG tablet Take 1 tablet (5 mg total) by mouth 3 (three) times daily as needed for muscle spasms. 30 tablet 1 unknown  . Fluticasone Furoate (ARNUITY ELLIPTA) 200 MCG/ACT AEPB Inhale 1 puff into the lungs daily. (Patient taking differently: Inhale 1 puff into the lungs daily as needed (shorntess of breath). ) 30 each 12 11/11/2016 at Unknown time  . furosemide (LASIX) 40 MG tablet Take 1 tablet by mouth daily.  0 11/12/2016 at 0000  . losartan (COZAAR) 50 MG tablet Take 50 mg by mouth daily.   11/11/2016 at Unknown time  . potassium chloride SA (K-DUR,KLOR-CON) 20 MEQ tablet Take 10 tablets by mouth daily.  0 11/11/2016 at Unknown time  . spironolactone (ALDACTONE) 25 MG tablet Take 1 tablet (25 mg total) by mouth daily.   11/11/2016 at Unknown time  . Tiotropium Bromide-Olodaterol (STIOLTO RESPIMAT) 2.5-2.5 MCG/ACT AERS Inhale 2 puffs into the lungs daily. 2 Inhaler 2 11/11/2016 at Unknown time  . traMADol (  ULTRAM) 50 MG tablet Take 1 tablet (50 mg total) by mouth every 8 (eight) hours as needed. 30 tablet 1 unknown  . warfarin (COUMADIN) 4 MG tablet Take 2-4 mg by mouth daily. Take 4 mg every day except on Sunday and Wednesday take 2 mg.   11/12/2016 at 0000  . nitroGLYCERIN (NITROSTAT) 0.4 MG SL tablet Place 1 tablet (0.4 mg total) under the tongue every 5 (five) minutes x 3 doses as needed for chest pain. 25 tablet 3 unk   Assessment: Pt on chronic Coumadin PTA.  Home dose listed above.  INR slightly below goal yesterday and asked to initiate Heparin until INR therapeutic.  Heparin level therapeutic but INR has increased to > 3.  Heparin has been held.  Plan to hold Warfarin today due to INR > 3.  CBC OK, no bleeding reported.   Goal of Therapy:  INR 2-3 Monitor platelets by anticoagulation protocol: Yes   Plan:  HOLD coumadin today INR daily Monitor for s/sx bleeding complications  Hart Robinsons  A 11/14/2016,7:32 AM

## 2016-11-14 NOTE — Progress Notes (Signed)
Pt sustaining a tachycardic HR in the 130's and fluctuates higher with highest being 170 bpm.  MD notified. Going to give prn Metoprolol and continue to monitor.

## 2016-11-14 NOTE — Progress Notes (Signed)
PROGRESS NOTE  Jeffrey Larson [id number] DOB: 08-01-1956 DOA: 11/12/2016 PCP: [name], DO Cardiologist: Dr. [name]  Brief Narrative: 60 year old man PMH coronary artery disease, atrial fibrillation, COPD who presented with gradually progressive shortness of breath. Admitted for acute hypoxic respiratory failure secondary to acute on chronic combined systolic/diastolic CHF, atrial fibrillation with rapid ventricular response.  Assessment/Plan: 1. Acute hypoxic respiratory failure secondary to CHF. Resolved. 2. Acute on chronic diastolic CHF, secondary to severe mitral stenosis. Echocardiogram this admission LVEF 60-65%, severe mitral stenosis, severe left atrial enlargement.  Still has some evidence of volume overload on exam. Weights down significantly, 321 pounds >> 307  BUN and creatinine trending up but still acceptable.  Favor one more dose of IV Lasix, then change to oral 12/21. 3. Atrial fibrillation CHA2DS2-VASc 4.   Complicated by mitral stenosis.   On warfarin as an outpatient. Cardiology has recommended IV heparin has patient felt to be high risk for left atrial appendage thrombus.  Coreg switched to Toprol-XL. Currently rate acceptable, but has severe tachycardia with ambulation. Further recommendations per cardiology. 4. Severe mitral stenosis  Etiology of both acute diastolic heart failure and rapid atrial fibrillation. Cardiology is recommended outpatient evaluation for percutaneous mitral balloon valvulotomy. Outpatient TEE to exclude left atrial appendage thrombus and coronary angiography also recommended. 5. COPD?, chronic bronchitis. No evidence of COPD exacerbation. 6. Essential hypertension. Stable. 7. Coronary artery disease.  Asymptomatic. Continue aspirin, beta blocker, Lipitor.   Reports clinical improvement. Still has volume overload and rapid heart rates with ambulation. Further recommendations per cardiology. Not yet ready for  discharge.  DVT prophylaxis: warfarin Code Status: full code Family Communication: none Disposition Plan: home  Murray Hodgkins, MD  [facility] Direct contact: [phone] --[place] [place]  --[email or web address]; password [email or web address]  7PM-7AM contact night coverage as above 11/14/2016, 10:08 AM  LOS: 2 days   Consultants:  Cardiology  Procedures:  2-D Echo Study Conclusions  - Procedure narrative: Transthoracic echocardiography. Image   quality was poor. The study was technically difficult, as a   result of poor acoustic windows, poor sound wave transmission,   and body habitus. Intravenous contrast (Definity) was   administered. - Left ventricle: The cavity size was normal. Wall thickness was   increased in a pattern of moderate LVH. Systolic function was   normal. The estimated ejection fraction was in the range of 60%   to 65%. The study is not technically sufficient to allow   evaluation of LV diastolic function. - Aortic valve: Mildly to moderately calcified annulus. There was   trivial regurgitation. - Mitral valve: Severely thickened, severely calcified leaflets .   The findings are consistent with severe stenosis. Mean gradient   (D): 16 mm Hg. Valve area by pressure half-time: 1.01 cm^2. Valve   area by continuity equation (using LVOT flow): 0.7 cm^2. - Left atrium: The atrium was severely dilated. - Right ventricle: The cavity size was mildly dilated.  Antimicrobials:    Interval history/Subjective: Rapid heart rate noted intermittently RN.  Overall feeling better. Currently no shortness of breath. No chest pain. Does feel heart racing and shortness of breath when ambulating.  Objective: Vitals:   11/14/16 0156 11/14/16 0523 11/14/16 0738 11/14/16 0824  BP: 95/63 107/72  114/76  Pulse: 86 (!) 108  81  Resp:  20    Temp:  97.5 F (36.4 C)    TempSrc:  Oral    SpO2: 96% 94% 98%   Weight:  (!) 139.3 kg (307 lb)  Height:        Intake/Output  Summary (Last 24 hours) at 11/14/16 1008 Last data filed at 11/14/16 0524  Gross per 24 hour  Intake           641.42 ml  Output             1725 ml  Net         -1083.58 ml     Filed Weights   11/12/16 1648 11/13/16 0634 11/14/16 0523  Weight: (!) 145.2 kg (320 lb) (!) 145.1 kg (319 lb 14.6 oz) (!) 139.3 kg (307 lb)    Exam:    Constitutional:  . Appears calm and comfortableSitting in chair ENMT:  . grossly normal hearing  Respiratory:  . CTA bilaterally, no w/r/r.  . Respiratory effort normal. No retractions or accessory muscle use Cardiovascular:  . Irregular, tachycardic, no murmur rub or gallop . 1+ bilateral LE extremity edema   . Telemetry atrial fibrillation, rate 102. Multiple instances of severe tachycardia. Psychiatric:  . judgement and insight appear normal . Mental status o Mood, affect appropriate  I have personally reviewed following labs and imaging studies:  I/O do not appear to be accurate   BUN, creatinine trending up 40/1.33. Potassium normal.  WBC up, 16.9 (on steroids). Hemoglobin normal  INR 3.24  EKG atrial fibrillation with rapid ventricular response, right axis deviation  Scheduled Meds: . aspirin EC  81 mg Oral Daily  . atorvastatin  80 mg Oral q1800  . furosemide  60 mg Intravenous BID  . ipratropium-albuterol  3 mL Nebulization TID  . lisinopril  5 mg Oral Daily  . methylPREDNISolone sodium succinate  60 mg Intravenous Q12H  . metoprolol succinate  25 mg Oral BID  . potassium chloride SA  40 mEq Oral Daily  . sodium chloride flush  3 mL Intravenous Q12H  . spironolactone  25 mg Oral Daily  . Warfarin - Pharmacist Dosing Inpatient   Does not apply Q24H   Continuous Infusions:  Principal Problem:   Acute on chronic combined systolic and diastolic CHF (congestive heart failure) (H[facility]) Active Problems:   History of MI (myocardial infarction)   Essential hypertension, benign   Acute exacerbation of chronic obstructive pulmonary  disease (COPD) ([place])   LOS: 2 days

## 2016-11-15 DIAGNOSIS — I050 Rheumatic mitral stenosis: Secondary | ICD-10-CM

## 2016-11-15 DIAGNOSIS — I481 Persistent atrial fibrillation: Secondary | ICD-10-CM

## 2016-11-15 DIAGNOSIS — I509 Heart failure, unspecified: Secondary | ICD-10-CM

## 2016-11-15 DIAGNOSIS — I5043 Acute on chronic combined systolic (congestive) and diastolic (congestive) heart failure: Secondary | ICD-10-CM

## 2016-11-15 DIAGNOSIS — I5033 Acute on chronic diastolic (congestive) heart failure: Secondary | ICD-10-CM

## 2016-11-15 DIAGNOSIS — J9601 Acute respiratory failure with hypoxia: Secondary | ICD-10-CM

## 2016-11-15 LAB — CBC
HCT: 46.8 % (ref 39.0–52.0)
Hemoglobin: 15.4 g/dL (ref 13.0–17.0)
MCH: 29.5 pg (ref 26.0–34.0)
MCHC: 32.9 g/dL (ref 30.0–36.0)
MCV: 89.7 fL (ref 78.0–100.0)
PLATELETS: 255 10*3/uL (ref 150–400)
RBC: 5.22 MIL/uL (ref 4.22–5.81)
RDW: 14.9 % (ref 11.5–15.5)
WBC: 22.6 10*3/uL — AB (ref 4.0–10.5)

## 2016-11-15 LAB — BASIC METABOLIC PANEL
ANION GAP: 9 (ref 5–15)
BUN: 46 mg/dL — ABNORMAL HIGH (ref 6–20)
CO2: 27 mmol/L (ref 22–32)
Calcium: 9.1 mg/dL (ref 8.9–10.3)
Chloride: 99 mmol/L — ABNORMAL LOW (ref 101–111)
Creatinine, Ser: 1.25 mg/dL — ABNORMAL HIGH (ref 0.61–1.24)
GLUCOSE: 128 mg/dL — AB (ref 65–99)
POTASSIUM: 4.7 mmol/L (ref 3.5–5.1)
Sodium: 135 mmol/L (ref 135–145)

## 2016-11-15 LAB — PROTIME-INR
INR: 2.82
Prothrombin Time: 30.3 seconds — ABNORMAL HIGH (ref 11.4–15.2)

## 2016-11-15 MED ORDER — METOPROLOL SUCCINATE ER 50 MG PO TB24: 50.0000 mg | ORAL_TABLET | Freq: Two times a day (BID) | ORAL | Status: DC

## 2016-11-15 MED ORDER — WARFARIN SODIUM 2 MG PO TABS: 2.0000 mg | ORAL_TABLET | Freq: Once | ORAL | Status: DC

## 2016-11-15 MED ORDER — METOPROLOL SUCCINATE ER 50 MG PO TB24: 50.0000 mg | ORAL_TABLET | Freq: Two times a day (BID) | ORAL | 0 refills | Status: DC

## 2016-11-15 NOTE — Progress Notes (Signed)
Both IV'S removed. WNL. Discharge papers given to pt. Verbalized understanding. Pt is awaiting ride home.

## 2016-11-15 NOTE — Progress Notes (Signed)
Patient Name: Jeffrey Larson Date of Encounter: 11/15/2016  Primary Cardiologist: Dr. [name] Consulting Cardiologist: Dr. Bronson Ing  Subjective   Sitting in chair. States that he does feel better than at presentation. Still mildly short of breath. No palpitations or chest pain.  Inpatient Medications    Scheduled Meds: . aspirin EC  81 mg Oral Daily  . atorvastatin  80 mg Oral q1800  . furosemide  40 mg Oral Daily  . ipratropium-albuterol  3 mL Nebulization BID  . lisinopril  5 mg Oral Daily  . metoprolol succinate  25 mg Oral BID  . potassium chloride SA  40 mEq Oral Daily  . sodium chloride flush  3 mL Intravenous Q12H  . spironolactone  25 mg Oral Daily  . warfarin  2 mg Oral Once  . Warfarin - Pharmacist Dosing Inpatient   Does not apply Q24H   PRN Meds: sodium chloride, acetaminophen, albuterol, albuterol, budesonide (PULMICORT) nebulizer solution, cyclobenzaprine, metoprolol, nitroGLYCERIN, ondansetron (ZOFRAN) IV, sodium chloride flush   Vital Signs    Vitals:   11/15/16 0423 11/15/16 0554 11/15/16 0806 11/15/16 0816  BP:  110/80  125/83  Pulse:  78  (!) 135  Resp:  18    Temp:  97.5 F (36.4 C)    TempSrc:  Oral    SpO2: 96% 99% 97%   Weight:  (!) 308 lb 13.8 oz (140.1 kg)    Height:        Intake/Output Summary (Last 24 hours) at 11/15/16 1050 Last data filed at 11/14/16 2224  Gross per 24 hour  Intake              483 ml  Output              700 ml  Net             -217 ml   Filed Weights   11/13/16 0634 11/14/16 0523 11/15/16 0554  Weight: (!) 319 lb 14.6 oz (145.1 kg) (!) 307 lb (139.3 kg) (!) 308 lb 13.8 oz (140.1 kg)    Physical Exam   Gen.: Morbidly obese male. HEENT: Conjunctiva and lids normal, oropharynx clear. Neck: Supple, no carotid bruits, no thyromegaly. Lungs: Diminished breath sounds with end expiratory wheeze, nonlabored breathing at rest. Cardiac: Distant, irregularly irregular, no gallop. Abdomen: Protuberant, nontender,  bowel sounds present. Extremities: Chronic appearing but improved leg edema, distal pulses 2+.  Labs    CBC  Recent Labs  11/12/16 1059 11/13/16 0453 11/14/16 0410 11/15/16 0404  WBC 12.0* 9.4 16.9* 22.6*  NEUTROABS 10.5* 8.4*  --   --   HGB 15.0 15.2 14.6 15.4  HCT 44.6 45.5 44.6 46.8  MCV 89.4 89.4 89.9 89.7  PLT 197 205 240 [id number]   Basic Metabolic Panel  Recent Labs  11/13/16 0453 11/14/16 0410 11/15/16 0404  NA 135 134* 135  K 4.2 4.4 4.7  CL 100* 98* 99*  CO2 25 27 27   GLUCOSE 173* 136* 128*  BUN 26* 40* 46*  CREATININE 1.19 1.33* 1.25*  CALCIUM 9.2 8.9 9.1  MG 1.8  --   --    Liver Function Tests  Recent Labs  11/12/16 1059  AST 23  ALT 21  ALKPHOS 51  BILITOT 1.4*  PROT 7.3  ALBUMIN 4.0   Cardiac Enzymes  Recent Labs  11/12/16 1059 11/12/16 1912 11/13/16 0101  TROPONINI 0.03* 0.03* 0.03*    Telemetry    I personally reviewed telemetry monitoring which shows persistent, rapid atrial  fibrillation.  Cardiac Studies   Echocardiogram 11/13/2016: Study Conclusions  - Procedure narrative: Transthoracic echocardiography. Image   quality was poor. The study was technically difficult, as a   result of poor acoustic windows, poor sound wave transmission,   and body habitus. Intravenous contrast (Definity) was   administered. - Left ventricle: The cavity size was normal. Wall thickness was   increased in a pattern of moderate LVH. Systolic function was   normal. The estimated ejection fraction was in the range of 60%   to 65%. The study is not technically sufficient to allow   evaluation of LV diastolic function. - Aortic valve: Mildly to moderately calcified annulus. There was   trivial regurgitation. - Mitral valve: Severely thickened, severely calcified leaflets .   The findings are consistent with severe stenosis. Mean gradient   (D): 16 mm Hg. Valve area by pressure half-time: 1.01 cm^2. Valve   area by continuity equation (using LVOT  flow): 0.7 cm^2. - Left atrium: The atrium was severely dilated. - Right ventricle: The cavity size was mildly dilated.  Patient Profile     60 year old morbidly obese male with history of progressive mitral stenosis, persistent rapid atrial fibrillation, and acute diastolic heart failure with follow-up echocardiogram outlined below. He follows with Dr. [name] in [place]. Plan during current hospital course has been diuresis and heart rate control, he is on Coumadin with therapeutic INR. He was to go home today.  Assessment & Plan    Discussed with patient and Dr. Sarajane Jews. He definitely wants to go home today and states that he will see Dr. [name] in the near future for further outpatient workup of his mitral stenosis. Would recommend increasing Toprol-XL to 50 mg twice daily, stop lisinopril for now to allow blood pressure room. Continue current diuretic regimen and make sure that he has a close follow-up within a week to see Dr. [name]. He is on Coumadin with therapeutic INR today.  Signed, Satira Sark, M.D., F.A.C.C.  11/15/2016, 10:50 AM

## 2016-11-15 NOTE — Discharge Summary (Signed)
Physician Discharge Summary  Jeffrey Larson [id number] DOB: 03/30/1956 DOA: 11/12/2016  PCP: Jeffrey Reeve, DO  Admit date: 11/12/2016 Discharge date: 11/15/2016  Recommendations for Outpatient Follow-up:  1. Atrial fibrillation and diastolic heart failure secondary to severe mitral stenosis.   Follow-up Information    Jeffrey Forward, MD On 11/29/2016.   Specialty:  Cardiology Why:  at 3:30 pm Contact information: [address] [place] [address] [phone]          Discharge Diagnoses:  1. Acute hypoxic respiratory failure 2. Acute on chronic diastolic congestive heart failure secondary to severe mitral stenosis 3. Severe mitral stenosis 4. Atrial fibrillation  Discharge Condition: improved Disposition: home  Diet recommendation: heart healthy  Filed Weights   11/13/16 0634 11/14/16 0523 11/15/16 0554  Weight: (!) 145.1 kg (319 lb 14.6 oz) (!) 139.3 kg (307 lb) (!) 140.1 kg (308 lb 13.8 oz)    History of present illness:  60 year old man PMH coronary artery disease, atrial fibrillation, COPD who presented with gradually progressive shortness of breath. Admitted for acute hypoxic respiratory failure secondary to acute on chronic combined systolic/diastolic CHF, atrial fibrillation with rapid ventricular response.  Hospital Course:  Patient was seen by cardiology, responded well to IV diuresis. Heart rate control somewhat difficult and Coreg was changed to Toprol-XL. Acute issues secondary to severe mitral stenosis and plan for close outpatient follow-up with his cardiologist. In the meantime he'll continue higher dose beta blocker, losartan will be put on hold to prevent hypotension. Continue outpatient diuretic therapy.  1. Acute hypoxic respiratory failure secondary to CHF. Resolved. 2. Acute on chronic diastolic CHF, secondary to severe mitral stenosis. Echocardiogram this admission LVEF 60-65%, severe mitral stenosis, severe left atrial  enlargement.  Overall improved. Still mild lower extremity edema, however expect will improve on oral Lasix. Cardiology concurs. 3. Atrial fibrillation CHA2DS2-VASc 4.   Complicated by mitral stenosis.   On warfarin.    Coreg switched to Toprol-XL.  Plan increase Toprol-XL today to 50 mg twice a day. 4. Severe mitral stenosis  Etiology of both acute diastolic heart failure and rapid atrial fibrillation. Cardiology is recommended outpatient evaluation for percutaneous mitral balloon valvulotomy. Outpatient TEE to exclude left atrial appendage thrombus and coronary angiography also recommended. 5. Chronic bronchitis. No wheezing. No evidence of exacerbation. No indication for further steroids. 6. Essential hypertension. Appears stable. 7. Coronary artery disease.  Appears stable. Continue aspirin, beta blocker, Lipitor.  Consultants:  Cardiology  Procedures:  2-D Echo Study Conclusions  - Procedure narrative: Transthoracic echocardiography. Image quality was poor. The study was technically difficult, as a result of poor acoustic windows, poor sound wave transmission, and body habitus. Intravenous contrast (Definity) was administered. - Left ventricle: The cavity size was normal. Wall thickness was increased in a pattern of moderate LVH. Systolic function was normal. The estimated ejection fraction was in the range of 60% to 65%. The study is not technically sufficient to allow evaluation of LV diastolic function. - Aortic valve: Mildly to moderately calcified annulus. There was trivial regurgitation. - Mitral valve: Severely thickened, severely calcified leaflets . The findings are consistent with severe stenosis. Mean gradient (D): 16 mm Hg. Valve area by pressure half-time: 1.01 cm^2. Valve area by continuity equation (using LVOT flow): 0.7 cm^2. - Left atrium: The atrium was severely dilated. - Right ventricle: The cavity size was mildly  dilated.   Discharge Instructions  Discharge Instructions    Diet - low sodium heart healthy    Complete by:  As directed    Discharge instructions    Complete by:  As directed    Call your physician or seek immediate medical attention for chest pain, shortness of breath, heart racing, fatigue, trouble breathing, swelling or worsening of condition.   Increase activity slowly    Complete by:  As directed      Allergies as of 11/15/2016      Reactions   Ramipril    cough      Medication List    STOP taking these medications   carvedilol 12.5 MG tablet Commonly known as:  COREG   losartan 50 MG tablet Commonly known as:  COZAAR     TAKE these medications   Albuterol Sulfate 108 (90 Base) MCG/ACT Aepb Commonly known as:  PROAIR RESPICLICK Inhale 1 puff into the lungs every 6 (six) hours as needed (sob).   albuterol (5 MG/ML) 0.5% nebulizer solution Commonly known as:  PROVENTIL Take 0.5 mLs (2.5 mg total) by nebulization every 6 (six) hours as needed for wheezing or shortness of breath.   ALDACTONE 25 MG tablet Generic drug:  spironolactone Take 1 tablet (25 mg total) by mouth daily.   aspirin 81 MG tablet Take 81 mg by mouth daily.   atorvastatin 80 MG tablet Commonly known as:  LIPITOR Take 1 tablet (80 mg total) by mouth daily at 6 PM.   cyclobenzaprine 5 MG tablet Commonly known as:  FLEXERIL Take 1 tablet (5 mg total) by mouth 3 (three) times daily as needed for muscle spasms.   Fluticasone Furoate 200 MCG/ACT Aepb Commonly known as:  ARNUITY ELLIPTA Inhale 1 puff into the lungs daily. What changed:  when to take this  reasons to take this   furosemide 40 MG tablet Commonly known as:  LASIX Take 1 tablet by mouth daily.   metoprolol succinate 50 MG 24 hr tablet Commonly known as:  TOPROL-XL Take 1 tablet (50 mg total) by mouth 2 (two) times daily. Take with or immediately following a meal.   nitroGLYCERIN 0.4 MG SL tablet Commonly known as:   NITROSTAT Place 1 tablet (0.4 mg total) under the tongue every 5 (five) minutes x 3 doses as needed for chest pain.   potassium chloride SA 20 MEQ tablet Commonly known as:  K-DUR,KLOR-CON Take 10 tablets by mouth daily.   Tiotropium Bromide-Olodaterol 2.5-2.5 MCG/ACT Aers Commonly known as:  STIOLTO RESPIMAT Inhale 2 puffs into the lungs daily.   traMADol 50 MG tablet Commonly known as:  ULTRAM Take 1 tablet (50 mg total) by mouth every 8 (eight) hours as needed.   warfarin 4 MG tablet Commonly known as:  COUMADIN Take 2-4 mg by mouth daily. Take 4 mg every day except on Sunday and Wednesday take 2 mg.      Allergies  Allergen Reactions  . Ramipril     cough    The results of significant diagnostics from this hospitalization (including imaging, microbiology, ancillary and laboratory) are listed below for reference.    Significant Diagnostic Studies: Dg Chest 2 View  Result Date: 11/12/2016 CLINICAL DATA:  Shortness of breath and chest congestion worsening over the past 2 weeks. History of atrial fibrillation. Coronary artery disease with stent placement, previous MI, COPD, former smoker. EXAM: CHEST  2 VIEW COMPARISON:  PA and lateral chest x-ray of November 06, 2016. FINDINGS: The lungs are well-expanded. The interstitial markings are coarse and slightly more conspicuous in the lower lobes today than on the previous study. There is no alveolar  infiltrate. There is no pleural effusion. The cardiac silhouette is enlarged. The pulmonary vascularity is prominent centrally and slightly more conspicuous than on the previous study. The mediastinum is normal in width. There is mild multilevel degenerative disc disease of the thoracic spine. IMPRESSION: Cardiomegaly with mild central pulmonary vascular congestion and mild interstitial edema. Underlying COPD. No alveolar pneumonia. Electronically Signed   By: David  Martinique M.D.   On: 11/12/2016 12:01   Labs: Basic Metabolic  Panel:  Recent Labs Lab 11/12/16 1059 11/13/16 0453 11/14/16 0410 11/15/16 0404  NA 134* 135 134* 135  K 4.3 4.2 4.4 4.7  CL 99* 100* 98* 99*  CO2 23 25 27 27   GLUCOSE 125* 173* 136* 128*  BUN 18 26* 40* 46*  CREATININE 1.06 1.19 1.33* 1.25*  CALCIUM 8.9 9.2 8.9 9.1  MG  --  1.8  --   --    Liver Function Tests:  Recent Labs Lab 11/12/16 1059  AST 23  ALT 21  ALKPHOS 51  BILITOT 1.4*  PROT 7.3  ALBUMIN 4.0   CBC:  Recent Labs Lab 11/12/16 1059 11/13/16 0453 11/14/16 0410 11/15/16 0404  WBC 12.0* 9.4 16.9* 22.6*  NEUTROABS 10.5* 8.4*  --   --   HGB 15.0 15.2 14.6 15.4  HCT 44.6 45.5 44.6 46.8  MCV 89.4 89.4 89.9 89.7  PLT 197 205 240 255   Cardiac Enzymes:  Recent Labs Lab 11/12/16 1059 11/12/16 1912 11/13/16 0101  TROPONINI 0.03* 0.03* 0.03*     Recent Labs  11/12/16 1059  BNP 293.0*    Principal Problem:   Acute on chronic combined systolic and diastolic CHF (congestive heart failure) (HCC) Active Problems:   History of MI (myocardial infarction)   Essential hypertension, benign   Acute exacerbation of chronic obstructive pulmonary disease (COPD) ([place])   Time coordinating discharge: 25 minutes  Signed:  Murray Hodgkins, MD [facility] 11/15/2016, 11:41 AM

## 2016-11-15 NOTE — Progress Notes (Signed)
[place] for Warfarin Indication: atrial fibrillation  Allergies  Allergen Reactions  . Ramipril     cough   Patient Measurements: Height: 5\' 11"  (180.3 cm) Weight: (!) 308 lb 13.8 oz (140.1 kg) IBW/kg (Calculated) : 75.3  HEPARIN DW (KG): 109.4  Vital Signs: Temp: 97.5 F (36.4 C) (12/21 0554) Temp Source: Oral (12/21 0554) BP: 125/83 (12/21 0816) Pulse Rate: 135 (12/21 0816)  Labs:  Recent Labs  11/12/16 1059  11/12/16 1912 11/13/16 0101 11/13/16 0453 11/14/16 0410 11/15/16 0404  HGB 15.0  --   --   --  15.2 14.6 15.4  HCT 44.6  --   --   --  45.5 44.6 46.8  PLT 197  --   --   --  205 240 255  LABPROT  --   < >  --   --  22.9* 33.8* 30.3*  INR  --   < >  --   --  1.99 3.24 2.82  HEPARINUNFRC  --   --   --   --   --  0.39  --   CREATININE 1.06  --   --   --  1.19 1.33* 1.25*  TROPONINI 0.03*  --  0.03* 0.03*  --   --   --   < > = values in this interval not displayed. Estimated Creatinine Clearance: 90 mL/min (by C-G formula based on SCr of 1.25 mg/dL (H)).  Medical History: Past Medical History:  Diagnosis Date  . Afib ([place])   . Coronary artery disease   . History of pneumonia    RML on CXR 07/20/14  . Hyperlipidemia   . Myocardial infarction   . Renal disorder    history kidney stone   Medications:  Prescriptions Prior to Admission  Medication Sig Dispense Refill Last Dose  . albuterol (PROVENTIL) (5 MG/ML) 0.5% nebulizer solution Take 0.5 mLs (2.5 mg total) by nebulization every 6 (six) hours as needed for wheezing or shortness of breath. 20 mL 1 11/12/2016 at 0000  . Albuterol Sulfate (PROAIR RESPICLICK) [id number] (90 Base) MCG/ACT AEPB Inhale 1 puff into the lungs every 6 (six) hours as needed (sob). 1 each 1 11/12/2016 at Unknown time  . aspirin 81 MG tablet Take 81 mg by mouth daily.   11/11/2016 at Unknown time  . atorvastatin (LIPITOR) 80 MG tablet Take 1 tablet (80 mg total) by mouth daily at 6 PM. 30 tablet 3  11/12/2016 at 0000  . carvedilol (COREG) 12.5 MG tablet Take 0.5 tablets (6.25 mg total) by mouth 2 (two) times daily with a meal.   11/12/2016 at 0000  . cyclobenzaprine (FLEXERIL) 5 MG tablet Take 1 tablet (5 mg total) by mouth 3 (three) times daily as needed for muscle spasms. 30 tablet 1 unknown  . Fluticasone Furoate (ARNUITY ELLIPTA) 200 MCG/ACT AEPB Inhale 1 puff into the lungs daily. (Patient taking differently: Inhale 1 puff into the lungs daily as needed (shorntess of breath). ) 30 each 12 11/11/2016 at Unknown time  . furosemide (LASIX) 40 MG tablet Take 1 tablet by mouth daily.  0 11/12/2016 at 0000  . losartan (COZAAR) 50 MG tablet Take 50 mg by mouth daily.   11/11/2016 at Unknown time  . potassium chloride SA (K-DUR,KLOR-CON) 20 MEQ tablet Take 10 tablets by mouth daily.  0 11/11/2016 at Unknown time  . spironolactone (ALDACTONE) 25 MG tablet Take 1 tablet (25 mg total) by mouth daily.   11/11/2016 at Unknown  time  . Tiotropium Bromide-Olodaterol (STIOLTO RESPIMAT) 2.5-2.5 MCG/ACT AERS Inhale 2 puffs into the lungs daily. 2 Inhaler 2 11/11/2016 at Unknown time  . traMADol (ULTRAM) 50 MG tablet Take 1 tablet (50 mg total) by mouth every 8 (eight) hours as needed. 30 tablet 1 unknown  . warfarin (COUMADIN) 4 MG tablet Take 2-4 mg by mouth daily. Take 4 mg every day except on Sunday and Wednesday take 2 mg.   11/12/2016 at 0000  . nitroGLYCERIN (NITROSTAT) 0.4 MG SL tablet Place 1 tablet (0.4 mg total) under the tongue every 5 (five) minutes x 3 doses as needed for chest pain. 25 tablet 3 unk   Assessment: Pt on chronic Coumadin PTA.  Home dose listed above.  INR therapeutic today  Goal of Therapy:  INR 2-3 Monitor platelets by anticoagulation protocol: Yes   Plan:  Coumadin 2 mg po today INR daily Monitor for s/sx bleeding complications  Darrio Bade [place] 11/15/2016,8:24 AM

## 2016-11-15 NOTE — Progress Notes (Signed)
PROGRESS NOTE  Jeffrey Larson [id number] DOB: 15-May-1956 DOA: 11/12/2016 PCP: [name], DO Cardiologist: Dr. [name]  Brief Narrative: 60 year old man PMH coronary artery disease, atrial fibrillation, COPD who presented with gradually progressive shortness of breath. Admitted for acute hypoxic respiratory failure secondary to acute on chronic combined systolic/diastolic CHF, atrial fibrillation with rapid ventricular response.  Assessment/Plan: 1. Acute hypoxic respiratory failure secondary to CHF. Resolved. 2. Acute on chronic diastolic CHF, secondary to severe mitral stenosis. Echocardiogram this admission LVEF 60-65%, severe mitral stenosis, severe left atrial enlargement.  Overall improved. Still mild lower extremity edema, however expect will improve on oral Lasix. Cardiology concurs. 3. Atrial fibrillation CHA2DS2-VASc 4.   Complicated by mitral stenosis.   On warfarin.    Coreg switched to Toprol-XL.  Plan increase Toprol-XL today to 50 mg twice a day. 4. Severe mitral stenosis  Etiology of both acute diastolic heart failure and rapid atrial fibrillation. Cardiology is recommended outpatient evaluation for percutaneous mitral balloon valvulotomy. Outpatient TEE to exclude left atrial appendage thrombus and coronary angiography also recommended. 5. Chronic bronchitis. No wheezing. No evidence of exacerbation. No indication for further steroids. 6. Essential hypertension. Appears stable. 7. Coronary artery disease.  Appears stable. Continue aspirin, beta blocker, Lipitor.   Overall improved. Heart rate still slightly elevated, however patient desires to go home. Discussed with Dr. Domenic Polite, agree with increasing Toprol. Patient will follow-up with his cardiologist outpatient. Discussed also with wife by telephone.  Murray Hodgkins, MD  [facility] Direct contact: [phone] --Via amion app OR  --[email or web address]; password [email or web address]  7PM-7AM contact night  coverage as above 11/15/2016, 10:12 AM  LOS: 3 days   Consultants:  Cardiology  Procedures:  2-D Echo Study Conclusions  - Procedure narrative: Transthoracic echocardiography. Image   quality was poor. The study was technically difficult, as a   result of poor acoustic windows, poor sound wave transmission,   and body habitus. Intravenous contrast (Definity) was   administered. - Left ventricle: The cavity size was normal. Wall thickness was   increased in a pattern of moderate LVH. Systolic function was   normal. The estimated ejection fraction was in the range of 60%   to 65%. The study is not technically sufficient to allow   evaluation of LV diastolic function. - Aortic valve: Mildly to moderately calcified annulus. There was   trivial regurgitation. - Mitral valve: Severely thickened, severely calcified leaflets .   The findings are consistent with severe stenosis. Mean gradient   (D): 16 mm Hg. Valve area by pressure half-time: 1.01 cm^2. Valve   area by continuity equation (using LVOT flow): 0.7 cm^2. - Left atrium: The atrium was severely dilated. - Right ventricle: The cavity size was mildly dilated.  Antimicrobials:    Interval history/Subjective: He is feeling well. Still dyspneic with exertion but really wants to go home. He plans on close follow-up with his cardiologist. No pain.  Objective: Vitals:   11/15/16 0423 11/15/16 0554 11/15/16 0806 11/15/16 0816  BP:  110/80  125/83  Pulse:  78  (!) 135  Resp:  18    Temp:  97.5 F (36.4 C)    TempSrc:  Oral    SpO2: 96% 99% 97%   Weight:  (!) 140.1 kg (308 lb 13.8 oz)    Height:        Intake/Output Summary (Last 24 hours) at 11/15/16 1012 Last data filed at 11/14/16 2224  Gross per 24 hour  Intake  483 ml  Output              700 ml  Net             -217 ml     Filed Weights   11/13/16 0634 11/14/16 0523 11/15/16 0554  Weight: (!) 145.1 kg (319 lb 14.6 oz) (!) 139.3 kg (307 lb) (!)  140.1 kg (308 lb 13.8 oz)    Exam: Constitutional:   Appears calm, comfortable. Respiratory:   Clear to auscultation bilaterally. No wheezes, rales or rhonchi anteriorly. Cardiovascular:   Irregular, tachycardic, no murmur, rub or gallop.  Atrial fibrillation with heart rate 100s.  1+ bilateral lower extremity edema. Psychiatric:   Speech fluent and clear.  I have personally reviewed following labs and imaging studies:  I/O do not appear to be accurate   Weight stable, 308 lb  BUN up to 46, creatinine down to 1.25, potassium 4.7  WBC up to 22.6 (on steroids). Hemoglobin normal  INR 2.82  Scheduled Meds: . aspirin EC  81 mg Oral Daily  . atorvastatin  80 mg Oral q1800  . furosemide  40 mg Oral Daily  . ipratropium-albuterol  3 mL Nebulization BID  . lisinopril  5 mg Oral Daily  . metoprolol succinate  25 mg Oral BID  . potassium chloride SA  40 mEq Oral Daily  . sodium chloride flush  3 mL Intravenous Q12H  . spironolactone  25 mg Oral Daily  . warfarin  2 mg Oral Once  . Warfarin - Pharmacist Dosing Inpatient   Does not apply Q24H   Continuous Infusions:  Principal Problem:   Acute on chronic combined systolic and diastolic CHF (congestive heart failure) (HCC) Active Problems:   History of MI (myocardial infarction)   Essential hypertension, benign   Acute exacerbation of chronic obstructive pulmonary disease (COPD) ([place])   LOS: 3 days

## 2016-11-17 DIAGNOSIS — J189 Pneumonia, unspecified organism: Secondary | ICD-10-CM | POA: Diagnosis not present

## 2016-11-22 DIAGNOSIS — I482 Chronic atrial fibrillation: Secondary | ICD-10-CM | POA: Diagnosis not present

## 2016-11-22 DIAGNOSIS — I252 Old myocardial infarction: Secondary | ICD-10-CM | POA: Diagnosis not present

## 2016-11-22 DIAGNOSIS — I2510 Atherosclerotic heart disease of native coronary artery without angina pectoris: Secondary | ICD-10-CM | POA: Diagnosis not present

## 2016-11-22 DIAGNOSIS — I119 Hypertensive heart disease without heart failure: Secondary | ICD-10-CM | POA: Diagnosis not present

## 2017-01-08 DIAGNOSIS — I482 Chronic atrial fibrillation: Secondary | ICD-10-CM | POA: Diagnosis not present

## 2017-02-21 DIAGNOSIS — I252 Old myocardial infarction: Secondary | ICD-10-CM | POA: Diagnosis not present

## 2017-02-21 DIAGNOSIS — I482 Chronic atrial fibrillation: Secondary | ICD-10-CM | POA: Diagnosis not present

## 2017-02-21 DIAGNOSIS — I2510 Atherosclerotic heart disease of native coronary artery without angina pectoris: Secondary | ICD-10-CM | POA: Diagnosis not present

## 2017-02-21 DIAGNOSIS — I119 Hypertensive heart disease without heart failure: Secondary | ICD-10-CM | POA: Diagnosis not present

## 2017-02-25 ENCOUNTER — Ambulatory Visit (INDEPENDENT_AMBULATORY_CARE_PROVIDER_SITE_OTHER): Payer: BLUE CROSS/BLUE SHIELD | Admitting: Osteopathic Medicine

## 2017-02-25 ENCOUNTER — Encounter: Payer: Self-pay | Admitting: Osteopathic Medicine

## 2017-02-25 VITALS — BP 120/83 | HR 75 | Ht 71.0 in | Wt 312.0 lb

## 2017-02-25 DIAGNOSIS — Z0000 Encounter for general adult medical examination without abnormal findings: Secondary | ICD-10-CM | POA: Diagnosis not present

## 2017-02-25 DIAGNOSIS — J449 Chronic obstructive pulmonary disease, unspecified: Secondary | ICD-10-CM

## 2017-02-25 DIAGNOSIS — I252 Old myocardial infarction: Secondary | ICD-10-CM

## 2017-02-25 DIAGNOSIS — I482 Chronic atrial fibrillation, unspecified: Secondary | ICD-10-CM

## 2017-02-25 DIAGNOSIS — I10 Essential (primary) hypertension: Secondary | ICD-10-CM | POA: Diagnosis not present

## 2017-02-25 DIAGNOSIS — I4811 Longstanding persistent atrial fibrillation: Secondary | ICD-10-CM | POA: Insufficient documentation

## 2017-02-25 DIAGNOSIS — Z6841 Body Mass Index (BMI) 40.0 and over, adult: Secondary | ICD-10-CM | POA: Diagnosis not present

## 2017-02-25 NOTE — Progress Notes (Signed)
HPI: Jeffrey Larson is a 61 y.o. male  who presents to [facility] today, 02/25/17,  for chief complaint of:  Chief Complaint  Patient presents with  . Annual Exam    No complaints today, see below for review of preventive care.  Brief review of chronic medical problems:  Hospitalization few months ago with CHF/A. fib RVR. Patient has been doing well, following with cardiology since that visit. No cardiology records are available. Patient states medication list below is accurate.  COPD/respiratory: Compliant with inhaled medications as below. Patient states that overall breathing is much better since last visit, doesn't feel he needs breathing test at this point. Very rare use of rescue inhaler. Able to perform all ADLs adequately. Reports occasional coughing up mucus and hoarseness, worse since allergy season began.    Past medical, surgical, social and family history reviewed: Patient Active Problem List   Diagnosis Date Noted  . Acute respiratory failure with hypoxia ([place])   . Acute on chronic combined systolic and diastolic CHF (congestive heart failure) ([place]) 11/12/2016  . Right shoulder pain 07/09/2016  . Bilateral low back pain without sciatica 04/30/2016  . Medication monitoring encounter 04/30/2016  . Hematuria, microscopic 04/30/2016  . Acute exacerbation of chronic obstructive pulmonary disease (COPD) ([place]) 12/22/2015  . COPD with chronic bronchitis ([place]) 09/08/2014  . Mitral valve stenosis 08/12/2014  . CAP (community acquired pneumonia) 08/04/2014  . History of MI (myocardial infarction) 08/04/2014  . Atrial fibrillation with RVR ([place]) 08/04/2014  . Hyperlipidemia 08/04/2014  . Essential hypertension, benign 08/04/2014   Past Surgical History:  Procedure Laterality Date  . APPENDECTOMY    . CARDIOVASCULAR STRESS TEST  03/2014   Borderline reversible ischemic changes at the apex.  Normal LV contractility/EF 52%.  . CORONARY STENT  PLACEMENT  7/12013  . LEFT HEART CATHETERIZATION WITH CORONARY ANGIOGRAM N/A 06/12/2012   Procedure: LEFT HEART CATHETERIZATION WITH CORONARY ANGIOGRAM;  Surgeon: Clent Demark, MD;  Location: [facility] CATH LAB;  Service: Cardiovascular;  Laterality: N/A;  . PERCUTANEOUS CORONARY STENT INTERVENTION (PCI-S) N/A 06/17/2012   Procedure: PERCUTANEOUS CORONARY STENT INTERVENTION (PCI-S);  Surgeon: Clent Demark, MD;  Location: [facility] CATH LAB;  Service: Cardiovascular;  Laterality: N/A;   Social History  Substance Use Topics  . Smoking status: Former Smoker    Years: 0.00    Types: Cigarettes    Quit date: 11/26/1996  . Smokeless tobacco: Never Used  . Alcohol use No   Family History  Problem Relation Age of Onset  . Heart disease Mother   . Heart disease Father      Current medication list and allergy/intolerance information reviewed:   Current Outpatient Prescriptions  Medication Sig Dispense Refill  . albuterol (PROVENTIL) (5 MG/ML) 0.5% nebulizer solution Take 0.5 mLs (2.5 mg total) by nebulization every 6 (six) hours as needed for wheezing or shortness of breath. 20 mL 1  . Albuterol Sulfate (PROAIR RESPICLICK) 920 (90 Base) MCG/ACT AEPB Inhale 1 puff into the lungs every 6 (six) hours as needed (sob). 1 each 1  . aspirin 81 MG tablet Take 81 mg by mouth daily.    . cyclobenzaprine (FLEXERIL) 5 MG tablet Take 1 tablet (5 mg total) by mouth 3 (three) times daily as needed for muscle spasms. 30 tablet 1  . Fluticasone Furoate (ARNUITY ELLIPTA) 200 MCG/ACT AEPB Inhale 1 puff into the lungs daily. (Patient taking differently: Inhale 1 puff into the lungs daily as needed (shorntess of breath). ) 30 each  12  . furosemide (LASIX) 40 MG tablet Take 1 tablet by mouth daily.  0  . metoprolol succinate (TOPROL-XL) 50 MG 24 hr tablet Take 1 tablet (50 mg total) by mouth 2 (two) times daily. Take with or immediately following a meal. 60 tablet 0  . potassium chloride SA (K-DUR,KLOR-CON) 20 MEQ tablet Take 10  tablets by mouth daily.  0  . spironolactone (ALDACTONE) 25 MG tablet Take 1 tablet (25 mg total) by mouth daily.    . Tiotropium Bromide-Olodaterol (STIOLTO RESPIMAT) 2.5-2.5 MCG/ACT AERS Inhale 2 puffs into the lungs daily. 2 Inhaler 2  . traMADol (ULTRAM) 50 MG tablet Take 1 tablet (50 mg total) by mouth every 8 (eight) hours as needed. 30 tablet 1  . warfarin (COUMADIN) 4 MG tablet Take 2-4 mg by mouth daily. Take 4 mg every day except on Sunday and Wednesday take 2 mg.    . atorvastatin (LIPITOR) 80 MG tablet Take 1 tablet (80 mg total) by mouth daily at 6 PM. 30 tablet 3  . nitroGLYCERIN (NITROSTAT) 0.4 MG SL tablet Place 1 tablet (0.4 mg total) under the tongue every 5 (five) minutes x 3 doses as needed for chest pain. 25 tablet 3   No current facility-administered medications for this visit.    Allergies  Allergen Reactions  . Ramipril     cough      Review of Systems:  Constitutional:  No  fever, no chills, No recent illness, No unintentional weight changes. No significant fatigue.   HEENT: No  headache, no vision change, no hearing change, No sore throat, +sinus pressure  Cardiac: No  chest pain, No  pressure, No palpitations, No  Orthopnea  Respiratory:  No  shortness of breath. No  Cough  Gastrointestinal: No  abdominal pain, No  nausea, No  vomiting,  No  blood in stool, No  diarrhea, No  constipation   Musculoskeletal: No new myalgia/arthralgia  Skin: No  Rash, No other wounds/concerning lesions  Endocrine: No cold intolerance,  No heat intolerance. No polyuria/polydipsia/polyphagia   Neurologic: No  weakness, No  dizziness  Psychiatric: No  concerns with depression, No  concerns with anxiety  Exam:  BP 120/83   Pulse 75   Ht 5\' 11"  (1.803 m)   Wt (!) 312 lb (141.5 kg)   BMI 43.52 kg/m   Constitutional: VS see above. General Appearance: alert, well-developed, well-nourished, NAD  Eyes: Normal lids and conjunctive, non-icteric sclera  Ears, Nose, Mouth,  Throat: MMM, Normal external inspection ears/nares/mouth/lips/gums. TM normal bilaterally. Pharynx/tonsils no erythema, no exudate. Nasal mucosa normal.   Neck: No masses, trachea midline. No thyroid enlargement. No tenderness/mass appreciated. No lymphadenopathy  Respiratory: Normal respiratory effort. no wheeze, no rhonchi, no rales  Cardiovascular: S1/S2 normal, no murmur, no rub/gallop auscultated. Irregularly irregular rhythm, normal rate. No lower extremity edema. Pedal pulse II/IV bilaterally DP and PT.   Gastrointestinal: Nontender, no masses. Obesity limits exam but no appreciable hepatomegaly or splenomegaly. No hernia appreciated. Bowel sounds normal. Rectal exam deferred.   Musculoskeletal: Gait normal. No clubbing/cyanosis of digits.   Neurological: Normal balance/coordination. No tremor. No cranial nerve deficit on limited exam. Motor and sensation intact and symmetric. Cerebellar reflexes intact.   Skin: warm, dry, intact. No rash/ulcer. No concerning nevi or subq nodules on limited exam.    Psychiatric: Normal judgment/insight. Normal mood and affect. Oriented x3.   ASSESSMENT/PLAN: Patient states he is getting routine complete lab work at cardiology tomorrow, prescription written for patient to provide  to the lab to cc results to me.  Annual physical exam  BMI 40.0-44.9, adult [facility])  Essential hypertension, benign  COPD with chronic bronchitis (HCC)  History of MI (myocardial infarction)  Chronic atrial fibrillation [facility])   MALE PREVENTIVE CARE  updated 02/25/17  ANNUAL SCREENING/COUNSELING  Any changes to health in the past year? no  Diet/Exercise - HEALTHY HABITS DISCUSSED TO DECREASE CV RISK History  Smoking Status  . Former Smoker  . Years: 0.00  . Types: Cigarettes  . Quit date: 11/26/1996  Smokeless Tobacco  . Never Used   History  Alcohol Use No   No flowsheet data found.  SEXUAL/REPRODUCTIVE HEALTH  STI testing needed/desired today? -  no  Any concerns with testosterone/libido? - no  INFECTIOUS DISEASE SCREENING  HIV - needs - declined  GC/CT - does not need  HepC - needs - declined  TB - does not need  CANCER SCREENING  Lung - USPSTF: 55-80yo w/ 30 py hx unless quit w/in 33yr - does not need  Colon - needs - Cologuard   Prostate - does not need - no FH   OTHER DISEASE SCREENING  Lipid - needs  DM2 - needs  AAA - 65-75yo ever smoked: does not need  Osteoporosis - men 61yo+ - does not need  ADULT VACCINATION  Influenza - annual vaccine recommended  Td - booster every 10 years   Zoster - option at 58, yes at 60+   PCV13 - was not indicated  PPSV23 - was not indicated Immunization History  Administered Date(s) Administered  . Influenza,inj,Quad PF,36+ Mos 11/08/2016  . Tdap 07/20/2015        Visit summary with medication list and pertinent instructions was printed for patient to review. All questions at time of visit were answered - patient instructed to contact office with any additional concerns. ER/RTC precautions were reviewed with the patient. Follow-up plan: Return in about 6 months (around 08/27/2017) for routine checkup - montior chronic medical conditions. Return sooner if needed. Marland Kitchen

## 2017-02-26 ENCOUNTER — Other Ambulatory Visit: Payer: Self-pay | Admitting: Osteopathic Medicine

## 2017-02-26 DIAGNOSIS — I10 Essential (primary) hypertension: Secondary | ICD-10-CM | POA: Diagnosis not present

## 2017-02-26 DIAGNOSIS — I482 Chronic atrial fibrillation: Secondary | ICD-10-CM | POA: Diagnosis not present

## 2017-02-26 DIAGNOSIS — E785 Hyperlipidemia, unspecified: Secondary | ICD-10-CM | POA: Diagnosis not present

## 2017-03-04 ENCOUNTER — Encounter: Payer: Self-pay | Admitting: Osteopathic Medicine

## 2017-03-04 DIAGNOSIS — Z1211 Encounter for screening for malignant neoplasm of colon: Secondary | ICD-10-CM | POA: Diagnosis not present

## 2017-03-04 DIAGNOSIS — Z1212 Encounter for screening for malignant neoplasm of rectum: Secondary | ICD-10-CM | POA: Diagnosis not present

## 2017-03-04 LAB — COLOGUARD

## 2017-03-11 ENCOUNTER — Telehealth: Payer: Self-pay | Admitting: Osteopathic Medicine

## 2017-03-11 NOTE — Telephone Encounter (Signed)
Please call patient: Cologuard results were negative, plan to repeat the test in 3 years.

## 2017-03-12 MED ORDER — ALBUTEROL SULFATE (5 MG/ML) 0.5% IN NEBU: 2.5000 mg | INHALATION_SOLUTION | Freq: Four times a day (QID) | RESPIRATORY_TRACT | 3 refills | Status: DC | PRN

## 2017-03-12 NOTE — Telephone Encounter (Signed)
Patient has been informed. Farley Crooker,CMA  

## 2017-04-02 ENCOUNTER — Ambulatory Visit (INDEPENDENT_AMBULATORY_CARE_PROVIDER_SITE_OTHER): Payer: BLUE CROSS/BLUE SHIELD

## 2017-04-02 ENCOUNTER — Ambulatory Visit (INDEPENDENT_AMBULATORY_CARE_PROVIDER_SITE_OTHER): Payer: BLUE CROSS/BLUE SHIELD | Admitting: Osteopathic Medicine

## 2017-04-02 VITALS — BP 101/70 | HR 76 | Temp 97.5°F | Wt 311.0 lb

## 2017-04-02 DIAGNOSIS — R059 Cough, unspecified: Secondary | ICD-10-CM

## 2017-04-02 DIAGNOSIS — I517 Cardiomegaly: Secondary | ICD-10-CM

## 2017-04-02 DIAGNOSIS — R05 Cough: Secondary | ICD-10-CM

## 2017-04-02 DIAGNOSIS — J441 Chronic obstructive pulmonary disease with (acute) exacerbation: Secondary | ICD-10-CM | POA: Diagnosis not present

## 2017-04-02 MED ORDER — PREDNISONE 20 MG PO TABS: 20.0000 mg | ORAL_TABLET | Freq: Two times a day (BID) | ORAL | 0 refills | Status: DC

## 2017-04-02 MED ORDER — IPRATROPIUM-ALBUTEROL 0.5-2.5 (3) MG/3ML IN SOLN: 3.0000 mL | RESPIRATORY_TRACT | 3 refills | Status: DC | PRN

## 2017-04-02 MED ORDER — AMOXICILLIN-POT CLAVULANATE 875-125 MG PO TABS: 1.0000 | ORAL_TABLET | Freq: Two times a day (BID) | ORAL | 0 refills | Status: DC

## 2017-04-02 NOTE — Patient Instructions (Addendum)
Plan: I think we are dealing with an COPD exacerbation either due to allergies or viral respiratory infection, if not feeling any better in 2-3 days on the steroids and the inhaler, please fill the prescription for antibiotic.

## 2017-04-02 NOTE — Progress Notes (Signed)
HPI: Jeffrey Larson is a 61 y.o. male who presents to [facility] 04/02/17 for chief complaint of:  Chief Complaint  Patient presents with  . Cough    Acute Illness: . Context:  Wife recently sick w/ URI, He is worried about this developing into pneumonia, sounds like he has had COPD exacerbations in the past which left untreated develop into more severe illness . Quality: Nagging cough, no fever/chills . Assoc signs/symptoms: see ROS . Duration: 3 days . Modifying factors: has tried the following OTC/Rx medications: Mucinex over-the-counter in addition to prescription medications as below   Past medical, social and family history reviewed.  Immune compromising conditions or other risk factors: Cardiac: Afib, HTN, CAD. Resp: COPD, hx CAP and ARF,   Current medications and allergies reviewed.  Inhaled emds: Albuterol neb and inhaler, arnuity, stiolto    Review of Systems:  Constitutional: No  fever/chills  HEENT: No  headache, No  sore throat, No  swollen glands  Cardiovascular: No chest pain  Respiratory:Yes  cough, No  shortness of breath  Gastrointestinal: No  nausea, No  vomiting,  No  diarrhea  Musculoskeletal:   No  myalgia/arthralgia  Skin/Integument:  No  rash   Detailed Exam:  BP 101/70   Pulse 76   Temp 97.5 F (36.4 C) (Oral)   Wt (!) 311 lb (141.1 kg)   SpO2 96%   BMI 43.38 kg/m   Constitutional:   VSS, see above.   General Appearance: alert, well-developed, well-nourished, NAD  Eyes:   Normal lids and conjunctive, non-icteric sclera  Ears, Nose, Mouth, Throat:   Normal external inspection ears/nares  Normal mouth/lips/gums, MMM  Skin:  Normal inspection, no rash or concerning lesions noted on limited exam  Neck:   No masses, trachea midline. normal lymph nodes  Respiratory:   Normal respiratory effort.   Yes  Wheeze bilaterally without rhonchi/rales  Cardiovascular:   S1/S2 normal, no  murmur/rub/gallop auscultated. RRR.  CXR on personal review Cardiomediastinal silhouette/heart size: enlarged but appears stable Obvious bony abnormality: none Infiltrate: none Mass or other opacity: none Atelectasis: none Diaphragms: normal Lateral view: normal Images were reviewed with the patient. Pt counseled that radiologist will review the images as well, our office will call if the formal read reveals any significant findings other than what has been noted above.    ASSESSMENT/PLAN:  Cough - Plan: DG Chest 2 View  Acute exacerbation of chronic obstructive pulmonary disease (COPD) (HCC) - Plan: predniSONE (DELTASONE) 20 MG tablet, ipratropium-albuterol (DUONEB) 0.5-2.5 (3) MG/3ML SOLN, amoxicillin-clavulanate (AUGMENTIN) 875-125 MG tablet azithromycin can cause problems with anticoagulation apparently, will send alternative to cover community-acquired pneumonia     Patient Instructions  Plan: I think we are dealing with an COPD exacerbation either due to allergies or viral respiratory infection, if not feeling any better in 2-3 days on the steroids and the inhaler, please fill the prescription for antibiotic.      Visit summary was printed for the patient with medications and pertinent instructions for patient to review. ER/RTC precautions reviewed. All questions answered. No Follow-up on file.

## 2017-04-18 ENCOUNTER — Other Ambulatory Visit: Payer: Self-pay | Admitting: Osteopathic Medicine

## 2017-04-18 DIAGNOSIS — I519 Heart disease, unspecified: Secondary | ICD-10-CM | POA: Insufficient documentation

## 2017-04-18 NOTE — Progress Notes (Signed)
Updated medication list based on Cardio notes

## 2017-05-22 DIAGNOSIS — I482 Chronic atrial fibrillation: Secondary | ICD-10-CM | POA: Diagnosis not present

## 2017-06-10 DIAGNOSIS — I2510 Atherosclerotic heart disease of native coronary artery without angina pectoris: Secondary | ICD-10-CM | POA: Diagnosis not present

## 2017-06-10 DIAGNOSIS — I252 Old myocardial infarction: Secondary | ICD-10-CM | POA: Diagnosis not present

## 2017-06-10 DIAGNOSIS — I482 Chronic atrial fibrillation: Secondary | ICD-10-CM | POA: Diagnosis not present

## 2017-06-10 DIAGNOSIS — I119 Hypertensive heart disease without heart failure: Secondary | ICD-10-CM | POA: Diagnosis not present

## 2017-07-10 ENCOUNTER — Encounter: Payer: Self-pay | Admitting: Physician Assistant

## 2017-07-10 ENCOUNTER — Ambulatory Visit (INDEPENDENT_AMBULATORY_CARE_PROVIDER_SITE_OTHER): Payer: BLUE CROSS/BLUE SHIELD | Admitting: Physician Assistant

## 2017-07-10 VITALS — BP 95/67 | HR 66 | Temp 97.5°F | Wt 310.0 lb

## 2017-07-10 DIAGNOSIS — J441 Chronic obstructive pulmonary disease with (acute) exacerbation: Secondary | ICD-10-CM

## 2017-07-10 MED ORDER — DOXYCYCLINE HYCLATE 100 MG PO TABS: 100.0000 mg | ORAL_TABLET | Freq: Two times a day (BID) | ORAL | 0 refills | Status: DC

## 2017-07-10 MED ORDER — PREDNISONE 20 MG PO TABS: ORAL_TABLET | ORAL | 0 refills | Status: DC

## 2017-07-10 MED ORDER — FLUTICASONE-UMECLIDIN-VILANT 100-62.5-25 MCG/INH IN AEPB: 1.0000 | INHALATION_SPRAY | Freq: Every day | RESPIRATORY_TRACT | 5 refills | Status: DC

## 2017-07-10 NOTE — Patient Instructions (Addendum)
Cut warfarin in half while on Doxycycline.

## 2017-07-10 NOTE — Progress Notes (Signed)
Subjective:    Patient ID: Jeffrey Larson, male    DOB: 09-06-1956, 61 y.o.   MRN: [id number]  HPI  Pt is a 61 yo male with Afib, CHF, COPD who presents to the clinic with complaints of cough and worsening SOB started 5 days ago. He is using his nebulizer at home with stiolto. He admits that he is not using his arunity. He just forgets it. No fever, sinus pressure, ear pain, runny nose, ST. His sputum is mostly clear with some green tinge. He most recently had exacerbation in may. He has no sick contacts. He denies any worsening edema or palpitations or CP.   .. Active Ambulatory Problems    Diagnosis Date Noted  . CAP (community acquired pneumonia) 08/04/2014  . History of MI (myocardial infarction) 08/04/2014  . Atrial fibrillation with RVR ([place]) 08/04/2014  . Hyperlipidemia 08/04/2014  . Essential hypertension, benign 08/04/2014  . Mitral valve stenosis 08/12/2014  . COPD with chronic bronchitis ([place]) 09/08/2014  . Acute exacerbation of chronic obstructive pulmonary disease (COPD) ([place]) 12/22/2015  . Bilateral low back pain without sciatica 04/30/2016  . Medication monitoring encounter 04/30/2016  . Hematuria, microscopic 04/30/2016  . Right shoulder pain 07/09/2016  . Acute on chronic combined systolic and diastolic CHF (congestive heart failure) ([place]) 11/12/2016  . Acute respiratory failure with hypoxia ([place])   . Atrial fibrillation ([place]) 02/25/2017  . Cardiac disease 04/18/2017   Resolved Ambulatory Problems    Diagnosis Date Noted  . Right shoulder injury 07/20/2015  . Fracture of acromion of scapula 07/20/2015   Past Medical History:  Diagnosis Date  . Afib ([place])   . Coronary artery disease   . History of pneumonia   . Hyperlipidemia   . Myocardial infarction ([place])   . Renal disorder        Review of Systems    see HPI.  Objective:   Physical Exam  Constitutional: He is oriented to person, place, and time.  Obese.   HENT:  Head: Normocephalic and  atraumatic.  Right Ear: External ear normal.  Left Ear: External ear normal.  Nose: Nose normal.  Mouth/Throat: Oropharynx is clear and moist.  TM"s clear.  Negative for any sinus pressure.   Eyes: Conjunctivae are normal.  Neck: Normal range of motion. Neck supple.  Cardiovascular: Normal rate, regular rhythm and normal heart sounds.   Pulmonary/Chest:  Bilateral lungs with coarse breath sounds and wheezing with rhonchi mostly at the base.   Lymphadenopathy:    He has no cervical adenopathy.  Neurological: He is alert and oriented to person, place, and time.  Skin:  No peripheral edema noted.           Assessment & Plan:   Marland KitchenMarland KitchenBaldomero was seen today for copd.  Diagnoses and all orders for this visit:  COPD exacerbation ([place])  Other orders -     Fluticasone-Umeclidin-Vilant (TRELEGY ELLIPTA) 100-62.5-25 MCG/INH AEPB; Inhale 1 puff into the lungs daily. -     predniSONE (DELTASONE) 20 MG tablet; Take 3 tablets for 3 days, take 2 tablets for 3 days, take 1 tablet for 3 days, take 1/2 tablet for 4 days. -     doxycycline (VIBRA-TABS) 100 MG tablet; Take 1 tablet (100 mg total) by mouth 2 (two) times daily. For 10 days.   Symptoms and exam most consistent with COPD exacerbation. I switched maintaince inhalers for compliance so that all 3 medications will be in one inhaler. Start Trelegy.gave coupon card. Discussed he would stop  arunity and stiloto.  I do not think he needs abx at this time. I did print if not improving in next 2-3 days. First start prednisone and duoneb 3-4 times a day.   Discussed bleeding risk while on doxy cut warfarin dose in half and resume full dose once stop Doxy if he even starts.  Need to follow up with PCP in 2-4 weeks after starting new inhaler.

## 2017-07-16 DIAGNOSIS — Z7901 Long term (current) use of anticoagulants: Secondary | ICD-10-CM | POA: Diagnosis not present

## 2017-08-02 ENCOUNTER — Ambulatory Visit (INDEPENDENT_AMBULATORY_CARE_PROVIDER_SITE_OTHER): Payer: BLUE CROSS/BLUE SHIELD | Admitting: Family Medicine

## 2017-08-02 ENCOUNTER — Encounter: Payer: Self-pay | Admitting: Family Medicine

## 2017-08-02 VITALS — BP 117/77 | HR 47 | Temp 97.7°F | Ht 71.0 in | Wt 319.0 lb

## 2017-08-02 DIAGNOSIS — J441 Chronic obstructive pulmonary disease with (acute) exacerbation: Secondary | ICD-10-CM | POA: Diagnosis not present

## 2017-08-02 DIAGNOSIS — R631 Polydipsia: Secondary | ICD-10-CM | POA: Diagnosis not present

## 2017-08-02 DIAGNOSIS — R635 Abnormal weight gain: Secondary | ICD-10-CM

## 2017-08-02 DIAGNOSIS — E119 Type 2 diabetes mellitus without complications: Secondary | ICD-10-CM | POA: Diagnosis not present

## 2017-08-02 LAB — POCT GLYCOSYLATED HEMOGLOBIN (HGB A1C): HEMOGLOBIN A1C: 6.6

## 2017-08-02 MED ORDER — AMBULATORY NON FORMULARY MEDICATION: 99 refills | Status: DC

## 2017-08-02 MED ORDER — PREDNISONE 20 MG PO TABS: 40.0000 mg | ORAL_TABLET | Freq: Every day | ORAL | 0 refills | Status: DC

## 2017-08-02 MED ORDER — AZITHROMYCIN 250 MG PO TABS: ORAL_TABLET | ORAL | 0 refills | Status: AC

## 2017-08-02 MED ORDER — IPRATROPIUM-ALBUTEROL 0.5-2.5 (3) MG/3ML IN SOLN
3.0000 mL | Freq: Once | RESPIRATORY_TRACT | Status: AC
  Administered 2017-08-02: 3 mL via RESPIRATORY_TRACT

## 2017-08-02 MED ORDER — ALBUTEROL SULFATE 108 (90 BASE) MCG/ACT IN AEPB: 1.0000 | INHALATION_SPRAY | Freq: Four times a day (QID) | RESPIRATORY_TRACT | 1 refills | Status: DC | PRN

## 2017-08-02 NOTE — Patient Instructions (Addendum)
Continue to use her Trelegy daily.  Use her albuterol (Proair inhaler or Neb machine) every 4 hours for the next 2 days. At that point if you're feeling better than -year-old every 6 hours for a couple of days and then continue to taper as feeling well.   Weight yourself each day over the weekend and make sure weight is coming down.  Try not to drink more than 60 ounces a day.  If your weight is not coming down quickly over the weekend and he may need to double up on your fluid pills for a couple of days.

## 2017-08-02 NOTE — Progress Notes (Signed)
Subjective:    Patient ID: Jeffrey Larson, male    DOB: December 11, 1955, 61 y.o.   MRN: [id number]  HPI 26 -year-old man with COPD comes in today with a history of atrial fibrillation and hypertension complaining of a chest cold.  He says he's had persistent cough shortness of breath over the last 8-9 days.He denies any fever chills or sweats. He feels that the shortness of breath has been getting progressively worse which is why he called to come in today. He says this feels very similar to his last COPD exacerbation, on  August 15 approximately 3 and half weeks ago. He is on Trelegy daily.   His weight is also up 9 pounds from previous about 3 weeks ago. He says he takes his diuretic every day they Fridays usually take it until the afternoon when he gets home. He denies any increased swelling over his ankles but says he is also been drinking a lot more fluid because she's been very thirsty lately.  Review of Systems  BP 117/77   Pulse (!) 47   Temp 97.7 F (36.5 C)   Ht 5\' 11"  (1.803 m)   Wt (!) 319 lb (144.7 kg)   SpO2 95%   PF 400 L/min   BMI 44.49 kg/m     Allergies  Allergen Reactions  . Ramipril     cough    Past Medical History:  Diagnosis Date  . Afib ([place])   . Coronary artery disease   . History of pneumonia    RML on CXR 07/20/14  . Hyperlipidemia   . Myocardial infarction ([place])   . Renal disorder    history kidney stone    Past Surgical History:  Procedure Laterality Date  . APPENDECTOMY    . CARDIOVASCULAR STRESS TEST  03/2014   Borderline reversible ischemic changes at the apex.  Normal LV contractility/EF 52%.  . CORONARY STENT PLACEMENT  7/12013  . LEFT HEART CATHETERIZATION WITH CORONARY ANGIOGRAM N/A 06/12/2012   Procedure: LEFT HEART CATHETERIZATION WITH CORONARY ANGIOGRAM;  Surgeon: Clent Demark, MD;  Location: [facility] CATH LAB;  Service: Cardiovascular;  Laterality: N/A;  . PERCUTANEOUS CORONARY STENT INTERVENTION (PCI-S) N/A 06/17/2012   Procedure:  PERCUTANEOUS CORONARY STENT INTERVENTION (PCI-S);  Surgeon: Clent Demark, MD;  Location: [facility] CATH LAB;  Service: Cardiovascular;  Laterality: N/A;    Social History   Social History  . Marital status: Married    Spouse name: N/A  . Number of children: N/A  . Years of education: N/A   Occupational History  . Not on file.   Social History Main Topics  . Smoking status: Former Smoker    Years: 0.00    Types: Cigarettes    Quit date: 11/26/1996  . Smokeless tobacco: Never Used  . Alcohol use No  . Drug use: No  . Sexual activity: Yes   Other Topics Concern  . Not on file   Social History Narrative  . No narrative on file    Family History  Problem Relation Age of Onset  . Heart disease Mother   . Heart disease Father     Outpatient Encounter Prescriptions as of 08/02/2017  Medication Sig  . albuterol (PROVENTIL) (5 MG/ML) 0.5% nebulizer solution Take 0.5 mLs (2.5 mg total) by nebulization every 6 (six) hours as needed for wheezing or shortness of breath.  . Albuterol Sulfate (PROAIR RESPICLICK) 350 (90 Base) MCG/ACT AEPB Inhale 1 puff into the lungs every 6 (six) hours as needed (  sob).  . AMBULATORY NON FORMULARY MEDICATION Medication Name: Glucometer, lancets and strip to test every other day. Dx: Diabetes type 2, E11.9. 100 strip and 100 lancets  . aspirin 81 MG tablet Take 81 mg by mouth daily.  Marland Kitchen atorvastatin (LIPITOR) 80 MG tablet Take 1 tablet (80 mg total) by mouth daily at 6 PM.  . azithromycin (ZITHROMAX) 250 MG tablet 2 Ttabs PO on Day 1, then one a day x 4 days.  . cyclobenzaprine (FLEXERIL) 5 MG tablet Take 1 tablet (5 mg total) by mouth 3 (three) times daily as needed for muscle spasms.  . Fluticasone-Umeclidin-Vilant (TRELEGY ELLIPTA) 100-62.5-25 MCG/INH AEPB Inhale 1 puff into the lungs daily.  . furosemide (LASIX) 40 MG tablet Take 2 tablets by mouth 2 (two) times daily.  Marland Kitchen ipratropium-albuterol (DUONEB) 0.5-2.5 (3) MG/3ML SOLN Take 3 mLs by nebulization  every 2 (two) hours as needed (wheeze, SOB).  Marland Kitchen losartan (COZAAR) 50 MG tablet Take 1 tablet (50 mg total) by mouth daily.  . metoprolol succinate (TOPROL-XL) 50 MG 24 hr tablet Take 1 tablet (50 mg total) by mouth 2 (two) times daily. Take with or immediately following a meal.  . nitroGLYCERIN (NITROSTAT) 0.4 MG SL tablet Place 1 tablet (0.4 mg total) under the tongue every 5 (five) minutes x 3 doses as needed for chest pain.  . potassium chloride (K-DUR) 10 MEQ tablet Take 1 tablet (10 mEq total) by mouth daily.  . potassium chloride SA (K-DUR,KLOR-CON) 20 MEQ tablet Take 10 tablets by mouth daily.  . predniSONE (DELTASONE) 20 MG tablet Take 2 tablets (40 mg total) by mouth daily.  Marland Kitchen spironolactone (ALDACTONE) 25 MG tablet Take 1 tablet (25 mg total) by mouth daily.  . traMADol (ULTRAM) 50 MG tablet Take 1 tablet (50 mg total) by mouth every 8 (eight) hours as needed.  . warfarin (COUMADIN) 4 MG tablet Take 2-4 mg by mouth daily. Take 4 mg every day except on Sunday and Wednesday take 2 mg.  . [DISCONTINUED] Albuterol Sulfate (PROAIR RESPICLICK) 960 (90 Base) MCG/ACT AEPB Inhale 1 puff into the lungs every 6 (six) hours as needed (sob).  . [DISCONTINUED] doxycycline (VIBRA-TABS) 100 MG tablet Take 1 tablet (100 mg total) by mouth 2 (two) times daily. For 10 days.  . [DISCONTINUED] predniSONE (DELTASONE) 20 MG tablet Take 3 tablets for 3 days, take 2 tablets for 3 days, take 1 tablet for 3 days, take 1/2 tablet for 4 days.  . [EXPIRED] ipratropium-albuterol (DUONEB) 0.5-2.5 (3) MG/3ML nebulizer solution 3 mL    No facility-administered encounter medications on file as of 08/02/2017.          Objective:   Physical Exam  Constitutional: He is oriented to person, place, and time. He appears well-developed and well-nourished.  HENT:  Head: Normocephalic and atraumatic.  Right Ear: External ear normal.  Left Ear: External ear normal.  Nose: Nose normal.  Mouth/Throat: Oropharynx is clear and  moist.  TMs and canals are clear.   Eyes: Pupils are equal, round, and reactive to light. Conjunctivae and EOM are normal.  Neck: Neck supple. No thyromegaly present.  Cardiovascular: Normal rate and normal heart sounds.   Pulmonary/Chest: Effort normal and breath sounds normal.  Lymphadenopathy:    He has no cervical adenopathy.  Neurological: He is alert and oriented to person, place, and time.  Skin: Skin is warm and dry.  Psychiatric: He has a normal mood and affect.       Assessment & Plan:  COPD  exacerbation -   Given albuterol treatment in the office today.  Treat with course of prednisone. Will use azithromycin since he just had a round of doxycycline. Start using albuterol more frequently and then taper over the next few days. He says he is out of the albuterol MDI send a prescription sent today.  Increased thirst - will check A1C to evaluate for diabetes.  New diagnosis today. Will send over glucometer, lancets and strips. Will have him f/u with PCP to discuss treatment options. He has been prednisone recently.  Possibly steroid induced.    Increased weight - I am concerned he may be volume overloaded.  Take diuretic when get home today and follow weights daily over the weekend to make sure that he is getting his weight off.

## 2017-08-20 ENCOUNTER — Telehealth: Payer: Self-pay

## 2017-08-20 NOTE — Telephone Encounter (Signed)
Pt has a physical on October 1st. He has a "boil", he said it is not his hemorroids, that is near his anus.  He is asking if he needs an appointment for this, if he should come in the 1st for this and reschedule his physical for later, or if it could be looked at at the time of his physical.  He said that it is not painful.  Please advise.

## 2017-08-20 NOTE — Telephone Encounter (Signed)
Notified patient.

## 2017-08-20 NOTE — Telephone Encounter (Signed)
If it is not painful, this is probably something that we can briefly take a look at why he is here for his annual. Please make him aware that insurance may charge him if any further workup is involved

## 2017-08-23 DIAGNOSIS — Z7901 Long term (current) use of anticoagulants: Secondary | ICD-10-CM | POA: Diagnosis not present

## 2017-08-26 ENCOUNTER — Encounter: Payer: Self-pay | Admitting: Osteopathic Medicine

## 2017-08-26 ENCOUNTER — Ambulatory Visit (INDEPENDENT_AMBULATORY_CARE_PROVIDER_SITE_OTHER): Payer: BLUE CROSS/BLUE SHIELD | Admitting: Osteopathic Medicine

## 2017-08-26 VITALS — BP 110/60 | HR 75 | Ht 71.0 in | Wt 310.0 lb

## 2017-08-26 DIAGNOSIS — J4489 Other specified chronic obstructive pulmonary disease: Secondary | ICD-10-CM

## 2017-08-26 DIAGNOSIS — I482 Chronic atrial fibrillation, unspecified: Secondary | ICD-10-CM

## 2017-08-26 DIAGNOSIS — J449 Chronic obstructive pulmonary disease, unspecified: Secondary | ICD-10-CM | POA: Diagnosis not present

## 2017-08-26 DIAGNOSIS — I2510 Atherosclerotic heart disease of native coronary artery without angina pectoris: Secondary | ICD-10-CM

## 2017-08-26 DIAGNOSIS — E119 Type 2 diabetes mellitus without complications: Secondary | ICD-10-CM | POA: Diagnosis not present

## 2017-08-26 DIAGNOSIS — Z23 Encounter for immunization: Secondary | ICD-10-CM | POA: Diagnosis not present

## 2017-08-26 DIAGNOSIS — I10 Essential (primary) hypertension: Secondary | ICD-10-CM | POA: Diagnosis not present

## 2017-08-26 DIAGNOSIS — K649 Unspecified hemorrhoids: Secondary | ICD-10-CM | POA: Diagnosis not present

## 2017-08-26 NOTE — Patient Instructions (Addendum)
See below for info on hemorrhoids. Sometimes these are not too painful but they can enlarge and/or bleed. Call me if there is a lot of pain and I can send in suppositories to use as needed. Preparation H cream is also helpful for pain/itching.   We need records from your cardiologist for most recent lab results! We have sent a request to get these reports. Please let us know if ou haven't heard back about me reviewing these records within the next 2 weeks. If we need additional blood work, I'll let you know.   Otherwise, let's check back in with an appointment in 3-4 months to follow-up on your sugar levels (A1C test). We should do this every 3-4 months all year.      Hemorrhoids Hemorrhoids are swollen veins in and around the rectum or anus. There are two types of hemorrhoids:  Internal hemorrhoids. These occur in the veins that are just inside the rectum. They may poke through to the outside and become irritated and painful.  External hemorrhoids. These occur in the veins that are outside of the anus and can be felt as a painful swelling or hard lump near the anus.  Most hemorrhoids do not cause serious problems, and they can be managed with home treatments such as diet and lifestyle changes. If home treatments do not help your symptoms, procedures can be done to shrink or remove the hemorrhoids. What are the causes? This condition is caused by increased pressure in the anal area. This pressure may result from various things, including:  Constipation.  Straining to have a bowel movement.  Diarrhea.  Pregnancy.  Obesity.  Sitting for long periods of time.  Heavy lifting or other activity that causes you to strain.  Anal sex.  What are the signs or symptoms? Symptoms of this condition include:  Pain.  Anal itching or irritation.  Rectal bleeding.  Leakage of stool (feces).  Anal swelling.  One or more lumps around the anus.  How is this diagnosed? This condition can  often be diagnosed through a visual exam. Other exams or tests may also be done, such as:  Examination of the rectal area with a gloved hand (digital rectal exam).  Examination of the anal canal using a small tube (anoscope).  A blood test, if you have lost a significant amount of blood.  A test to look inside the colon (sigmoidoscopy or colonoscopy).  How is this treated? This condition can usually be treated at home. However, various procedures may be done if dietary changes, lifestyle changes, and other home treatments do not help your symptoms. These procedures can help make the hemorrhoids smaller or remove them completely. Some of these procedures involve surgery, and others do not. Common procedures include:  Rubber band ligation. Rubber bands are placed at the base of the hemorrhoids to cut off the blood supply to them.  Sclerotherapy. Medicine is injected into the hemorrhoids to shrink them.  Infrared coagulation. A type of light energy is used to get rid of the hemorrhoids.  Hemorrhoidectomy surgery. The hemorrhoids are surgically removed, and the veins that supply them are tied off.  Stapled hemorrhoidopexy surgery. A circular stapling device is used to remove the hemorrhoids and use staples to cut off the blood supply to them.  Follow these instructions at home: Eating and drinking  Eat foods that have a lot of fiber in them, such as whole grains, beans, nuts, fruits, and vegetables. Ask your health care provider about taking products  that have added fiber (fiber supplements).  Drink enough fluid to keep your urine clear or pale yellow. Managing pain and swelling  Take warm sitz baths for 20 minutes, 3-4 times a day to ease pain and discomfort.  If directed, apply ice to the affected area. Using ice packs between sitz baths may be helpful. ? Put ice in a plastic bag. ? Place a towel between your skin and the bag. ? Leave the ice on for 20 minutes, 2-3 times a  day. General instructions  Take over-the-counter and prescription medicines only as told by your health care provider.  Use medicated creams or suppositories as told.  Exercise regularly.  Go to the bathroom when you have the urge to have a bowel movement. Do not wait.  Avoid straining to have bowel movements.  Keep the anal area dry and clean. Use wet toilet paper or moist towelettes after a bowel movement.  Do not sit on the toilet for long periods of time. This increases blood pooling and pain. Contact a health care provider if:  You have increasing pain and swelling that are not controlled by treatment or medicine.  You have uncontrolled bleeding.  You have difficulty having a bowel movement, or you are unable to have a bowel movement.  You have pain or inflammation outside the area of the hemorrhoids. This information is not intended to replace advice given to you by your health care provider. Make sure you discuss any questions you have with your health care provider. Document Released: 11/09/2000 Document Revised: 04/11/2016 Document Reviewed: 07/27/2015 Elsevier Interactive Patient Education  2017 [employer].

## 2017-08-26 NOTE — Progress Notes (Signed)
HPI: Jeffrey Larson is a 61 y.o. male  who presents to [facility] today, 08/26/17,  for chief complaint of:  Chief Complaint  Patient presents with  . Follow-up    Last annual - we did preventive care visit 02/2017, we never got lab reports from cardiologist. Will request records today  Cardiac: Essential hypertension, benign, Chronic atrial fibrillation, CAD w/ Hx MI. Following with cardiology. No chest pain, pressure, shortness of breath.  Resp: COPD - brething well! Seen awhile ago for COPD exacerbation while out. States that his breathing has been much better. No complaints, no shortness of breath or significant cough.  Endocrine: DM2, without long-term current use of insulin. Doing well. Recent A1C indicates good control. He has had pneumonia shot at some point at his pharmacy.  New issue: skin concern - boil on his bottom. States not sure it's hemorrhoids. Present few months. Bled awhile back but not painful.       Past medical history, surgical history, social history and family history reviewed.  Patient Active Problem List   Diagnosis Date Noted  . Controlled type 2 diabetes mellitus without complication, without long-term current use of insulin ([place]) 08/02/2017  . Cardiac disease 04/18/2017  . Atrial fibrillation ([place]) 02/25/2017  . Acute respiratory failure with hypoxia ([place])   . Acute on chronic combined systolic and diastolic CHF (congestive heart failure) ([place]) 11/12/2016  . Right shoulder pain 07/09/2016  . Bilateral low back pain without sciatica 04/30/2016  . Medication monitoring encounter 04/30/2016  . Hematuria, microscopic 04/30/2016  . Acute exacerbation of chronic obstructive pulmonary disease (COPD) ([place]) 12/22/2015  . COPD with chronic bronchitis ([place]) 09/08/2014  . Mitral valve stenosis 08/12/2014  . History of MI (myocardial infarction) 08/04/2014  . Atrial fibrillation with RVR ([place]) 08/04/2014  . Hyperlipidemia  08/04/2014  . Essential hypertension, benign 08/04/2014    Current medication list and allergy/intolerance information reviewed.   Current Outpatient Prescriptions on File Prior to Visit  Medication Sig Dispense Refill  . albuterol (PROVENTIL) (5 MG/ML) 0.5% nebulizer solution Take 0.5 mLs (2.5 mg total) by nebulization every 6 (six) hours as needed for wheezing or shortness of breath. 20 mL 3  . Albuterol Sulfate (PROAIR RESPICLICK) 478 (90 Base) MCG/ACT AEPB Inhale 1 puff into the lungs every 6 (six) hours as needed (sob). 1 each 1  . AMBULATORY NON FORMULARY MEDICATION Medication Name: Glucometer, lancets and strip to test every other day. Dx: Diabetes type 2, E11.9. 100 strip and 100 lancets 1 Units PRN  . aspirin 81 MG tablet Take 81 mg by mouth daily.    Marland Kitchen atorvastatin (LIPITOR) 80 MG tablet Take 1 tablet (80 mg total) by mouth daily at 6 PM. 30 tablet 3  . cyclobenzaprine (FLEXERIL) 5 MG tablet Take 1 tablet (5 mg total) by mouth 3 (three) times daily as needed for muscle spasms. 30 tablet 1  . Fluticasone-Umeclidin-Vilant (TRELEGY ELLIPTA) 100-62.5-25 MCG/INH AEPB Inhale 1 puff into the lungs daily. 1 each 5  . furosemide (LASIX) 40 MG tablet Take 2 tablets by mouth 2 (two) times daily.  0  . ipratropium-albuterol (DUONEB) 0.5-2.5 (3) MG/3ML SOLN Take 3 mLs by nebulization every 2 (two) hours as needed (wheeze, SOB). 60 mL 3  . losartan (COZAAR) 50 MG tablet Take 1 tablet (50 mg total) by mouth daily. 90 tablet 1  . metoprolol succinate (TOPROL-XL) 50 MG 24 hr tablet Take 1 tablet (50 mg total) by mouth 2 (two) times daily. Take with  or immediately following a meal. 60 tablet 0  . nitroGLYCERIN (NITROSTAT) 0.4 MG SL tablet Place 1 tablet (0.4 mg total) under the tongue every 5 (five) minutes x 3 doses as needed for chest pain. 25 tablet 3  . potassium chloride (K-DUR) 10 MEQ tablet Take 1 tablet (10 mEq total) by mouth daily.    . potassium chloride SA (K-DUR,KLOR-CON) 20 MEQ tablet Take  10 tablets by mouth daily.  0  . predniSONE (DELTASONE) 20 MG tablet Take 2 tablets (40 mg total) by mouth daily. 10 tablet 0  . spironolactone (ALDACTONE) 25 MG tablet Take 1 tablet (25 mg total) by mouth daily.    . traMADol (ULTRAM) 50 MG tablet Take 1 tablet (50 mg total) by mouth every 8 (eight) hours as needed. 30 tablet 1  . warfarin (COUMADIN) 4 MG tablet Take 2-4 mg by mouth daily. Take 4 mg every day except on Sunday and Wednesday take 2 mg.    . [DISCONTINUED] amiodarone (PACERONE) 200 MG tablet Take 1 tablet (200 mg total) by mouth 2 (two) times daily. 60 tablet 3  . [DISCONTINUED] ramipril (ALTACE) 2.5 MG capsule Take 2.5 mg by mouth daily.      No current facility-administered medications on file prior to visit.    Allergies  Allergen Reactions  . Ramipril     cough      Review of Systems:  Constitutional: No recent illness  HEENT: No  headache, no vision change  Cardiac: No  chest pain, No  pressure, No palpitations  Respiratory:  No  shortness of breath. No  Cough  Gastrointestinal: No  abdominal pain, no change on bowel habits, +constipation   Musculoskeletal: No new myalgia/arthralgia  Neurologic: No  weakness, No  Dizziness  Psychiatric: No  concerns with depression, No  concerns with anxiety  Exam:  BP 110/60   Pulse 75   Ht 5\' 11"  (1.803 m)   Wt (!) 310 lb (140.6 kg)   BMI 43.24 kg/m   Constitutional: VS see above. General Appearance: alert, well-developed, well-nourished, NAD  Eyes: Normal lids and conjunctive, non-icteric sclera  Ears, Nose, Mouth, Throat: MMM, Normal external inspection ears/nares/mouth/lips/gums.  Neck: No masses, trachea midline.   Respiratory: Normal respiratory effort. no wheeze, no rhonchi, no rales  Cardiovascular: S1/S2 normal, no murmur, no rub/gallop auscultated. RRR.   Musculoskeletal: Gait normal. Symmetric and independent movement of all extremities  Anorectal: (+)noninflamed external hemorrhoid    Neurological: Normal balance/coordination. No tremor.  Skin: warm, dry, intact.   Psychiatric: Normal judgment/insight. Normal mood and affect. Oriented x3.    Recent Results (from the past 2160 hour(s))  POCT glycosylated hemoglobin (Hb A1C)     Status: None   Collection Time: 08/02/17  2:48 PM  Result Value Ref Range   Hemoglobin A1C 6.6       ASSESSMENT/PLAN:   Essential hypertension, benign  Chronic atrial fibrillation (HCC)  COPD with chronic bronchitis (HCC)  Controlled type 2 diabetes mellitus without complication, without long-term current use of insulin (HCC)  Coronary artery disease involving native heart without angina pectoris, unspecified vessel or lesion type  Need for immunization against influenza - Plan: Flu Vaccine QUAD 36+ mos IM  Hemorrhoids, unspecified hemorrhoid type    Patient Instructions  See below for info on hemorrhoids. Sometimes these are not too painful but they can enlarge and/or bleed. Call me if there is a lot of pain and I can send in suppositories to use as needed. Preparation H cream is  also helpful for pain/itching.   We need records from your cardiologist for most recent lab results! We have sent a request to get these reports. Please let us know if ou haven't heard back about me reviewing these records within the next 2 weeks. If we need additional blood work, I'll let you know.   Otherwise, let's check back in with an appointment in 3-4 months to follow-up on your sugar levels (A1C test). We should do this every 3-4 months all year.      Hemorrhoids Hemorrhoids are swollen veins in and around the rectum or anus. There are two types of hemorrhoids:  Internal hemorrhoids. These occur in the veins that are just inside the rectum. They may poke through to the outside and become irritated and painful.  External hemorrhoids. These occur in the veins that are outside of the anus and can be felt as a painful swelling or hard lump  near the anus.  Most hemorrhoids do not cause serious problems, and they can be managed with home treatments such as diet and lifestyle changes. If home treatments do not help your symptoms, procedures can be done to shrink or remove the hemorrhoids. What are the causes? This condition is caused by increased pressure in the anal area. This pressure may result from various things, including:  Constipation.  Straining to have a bowel movement.  Diarrhea.  Pregnancy.  Obesity.  Sitting for long periods of time.  Heavy lifting or other activity that causes you to strain.  Anal sex.  What are the signs or symptoms? Symptoms of this condition include:  Pain.  Anal itching or irritation.  Rectal bleeding.  Leakage of stool (feces).  Anal swelling.  One or more lumps around the anus.  How is this diagnosed? This condition can often be diagnosed through a visual exam. Other exams or tests may also be done, such as:  Examination of the rectal area with a gloved hand (digital rectal exam).  Examination of the anal canal using a small tube (anoscope).  A blood test, if you have lost a significant amount of blood.  A test to look inside the colon (sigmoidoscopy or colonoscopy).  How is this treated? This condition can usually be treated at home. However, various procedures may be done if dietary changes, lifestyle changes, and other home treatments do not help your symptoms. These procedures can help make the hemorrhoids smaller or remove them completely. Some of these procedures involve surgery, and others do not. Common procedures include:  Rubber band ligation. Rubber bands are placed at the base of the hemorrhoids to cut off the blood supply to them.  Sclerotherapy. Medicine is injected into the hemorrhoids to shrink them.  Infrared coagulation. A type of light energy is used to get rid of the hemorrhoids.  Hemorrhoidectomy surgery. The hemorrhoids are surgically  removed, and the veins that supply them are tied off.  Stapled hemorrhoidopexy surgery. A circular stapling device is used to remove the hemorrhoids and use staples to cut off the blood supply to them.  Follow these instructions at home: Eating and drinking  Eat foods that have a lot of fiber in them, such as whole grains, beans, nuts, fruits, and vegetables. Ask your health care provider about taking products that have added fiber (fiber supplements).  Drink enough fluid to keep your urine clear or pale yellow. Managing pain and swelling  Take warm sitz baths for 20 minutes, 3-4 times a day to ease pain and discomfort.  If directed, apply ice to the affected area. Using ice packs between sitz baths may be helpful. ? Put ice in a plastic bag. ? Place a towel between your skin and the bag. ? Leave the ice on for 20 minutes, 2-3 times a day. General instructions  Take over-the-counter and prescription medicines only as told by your health care provider.  Use medicated creams or suppositories as told.  Exercise regularly.  Go to the bathroom when you have the urge to have a bowel movement. Do not wait.  Avoid straining to have bowel movements.  Keep the anal area dry and clean. Use wet toilet paper or moist towelettes after a bowel movement.  Do not sit on the toilet for long periods of time. This increases blood pooling and pain. Contact a health care provider if:  You have increasing pain and swelling that are not controlled by treatment or medicine.  You have uncontrolled bleeding.  You have difficulty having a bowel movement, or you are unable to have a bowel movement.  You have pain or inflammation outside the area of the hemorrhoids. This information is not intended to replace advice given to you by your health care provider. Make sure you discuss any questions you have with your health care provider. Document Released: 11/09/2000 Document Revised: 04/11/2016 Document  Reviewed: 07/27/2015 Elsevier Interactive Patient Education  2017 [employer].     Follow-up plan: Return in about 3 months (around 11/26/2017) for recheck sugars - A1C. Sooner if needed .  Visit summary with medication list and pertinent instructions was printed for patient to review, alert [place] if any changes needed. All questions at time of visit were answered - patient instructed to contact office with any additional concerns. ER/RTC precautions were reviewed with the patient and understanding verbalized.   Note: Total time spent 25 minutes, greater than 50% of the visit was spent face-to-face counseling and coordinating care for the following: The primary encounter diagnosis was Essential hypertension, benign. Diagnoses of Chronic atrial fibrillation ([place]), COPD with chronic bronchitis ([place]), Controlled type 2 diabetes mellitus without complication, without long-term current use of insulin ([place]), Coronary artery disease involving native heart without angina pectoris, unspecified vessel or lesion type, Need for immunization against influenza, and Hemorrhoids, unspecified hemorrhoid type were also pertinent to this visit.Marland Kitchen

## 2017-09-16 DIAGNOSIS — I119 Hypertensive heart disease without heart failure: Secondary | ICD-10-CM | POA: Diagnosis not present

## 2017-09-16 DIAGNOSIS — I482 Chronic atrial fibrillation: Secondary | ICD-10-CM | POA: Diagnosis not present

## 2017-09-16 DIAGNOSIS — I252 Old myocardial infarction: Secondary | ICD-10-CM | POA: Diagnosis not present

## 2017-09-16 DIAGNOSIS — I2510 Atherosclerotic heart disease of native coronary artery without angina pectoris: Secondary | ICD-10-CM | POA: Diagnosis not present

## 2017-09-23 DIAGNOSIS — Z7901 Long term (current) use of anticoagulants: Secondary | ICD-10-CM | POA: Diagnosis not present

## 2017-10-01 ENCOUNTER — Encounter: Payer: Self-pay | Admitting: Osteopathic Medicine

## 2017-10-01 ENCOUNTER — Ambulatory Visit (INDEPENDENT_AMBULATORY_CARE_PROVIDER_SITE_OTHER): Payer: BLUE CROSS/BLUE SHIELD

## 2017-10-01 ENCOUNTER — Other Ambulatory Visit: Payer: Self-pay

## 2017-10-01 ENCOUNTER — Ambulatory Visit (INDEPENDENT_AMBULATORY_CARE_PROVIDER_SITE_OTHER): Payer: BLUE CROSS/BLUE SHIELD | Admitting: Osteopathic Medicine

## 2017-10-01 VITALS — BP 121/70 | HR 61 | Temp 98.1°F | Resp 18 | Wt 318.9 lb

## 2017-10-01 DIAGNOSIS — I6523 Occlusion and stenosis of bilateral carotid arteries: Secondary | ICD-10-CM | POA: Diagnosis not present

## 2017-10-01 DIAGNOSIS — M5412 Radiculopathy, cervical region: Secondary | ICD-10-CM

## 2017-10-01 DIAGNOSIS — M542 Cervicalgia: Secondary | ICD-10-CM

## 2017-10-01 DIAGNOSIS — M47812 Spondylosis without myelopathy or radiculopathy, cervical region: Secondary | ICD-10-CM | POA: Diagnosis not present

## 2017-10-01 MED ORDER — CYCLOBENZAPRINE HCL 10 MG PO TABS: 5.0000 mg | ORAL_TABLET | Freq: Three times a day (TID) | ORAL | 1 refills | Status: DC | PRN

## 2017-10-01 MED ORDER — KETOROLAC TROMETHAMINE 60 MG/2ML IM SOLN
60.0000 mg | Freq: Once | INTRAMUSCULAR | Status: AC
  Administered 2017-10-01: 60 mg via INTRAMUSCULAR

## 2017-10-01 MED ORDER — DIAZEPAM 5 MG PO TABS: 5.0000 mg | ORAL_TABLET | Freq: Four times a day (QID) | ORAL | 0 refills | Status: DC | PRN

## 2017-10-01 NOTE — Patient Instructions (Addendum)
Plan:  Toradol shot in the office now  Can restart Aleve or Advil tomorrow  Can continue Tylenol for now  Will get Xray  Will try muscle relaxer   If not better tomorrow, try the Valium  Don't take Valium with Flexeril  Call me tomorrow or day after and let me know how you're doing  Can refer to physical therapy if needed   If worse/change - to ER!

## 2017-10-01 NOTE — Progress Notes (Signed)
HPI: Jeffrey Larson is a 61 y.o. male with PMH  has a past medical history of Afib ([place]), Coronary artery disease, History of pneumonia, Hyperlipidemia, Myocardial infarction ([place]), and Renal disorder.  who presents to [facility] today, 10/01/17,  for chief complaint of:  Chief Complaint  Patient presents with  . Pain    Behind right ear on/off x 1 week    Sharp pain behind ear for one week on and off. Radiates down neck toward scapular area. Tender to touch sometimes, hurts occasionally with jaw movement or neck movement but not consistently, can also hurt without provocation. Feels like a muscle spasm.   No associated ha/vc, n/v, weakness, speech problem. No rash.   Past medical, surgical, social and family history reviewed:  Patient Active Problem List   Diagnosis Date Noted  . Coronary artery disease 08/26/2017  . Controlled type 2 diabetes mellitus without complication, without long-term current use of insulin ([place]) 08/02/2017  . Cardiac disease 04/18/2017  . Atrial fibrillation ([place]) 02/25/2017  . Acute respiratory failure with hypoxia ([place])   . Acute on chronic combined systolic and diastolic CHF (congestive heart failure) ([place]) 11/12/2016  . Right shoulder pain 07/09/2016  . Bilateral low back pain without sciatica 04/30/2016  . Medication monitoring encounter 04/30/2016  . Hematuria, microscopic 04/30/2016  . Acute exacerbation of chronic obstructive pulmonary disease (COPD) ([place]) 12/22/2015  . COPD with chronic bronchitis ([place]) 09/08/2014  . Mitral valve stenosis 08/12/2014  . History of MI (myocardial infarction) 08/04/2014  . Atrial fibrillation with RVR ([place]) 08/04/2014  . Hyperlipidemia 08/04/2014  . Essential hypertension, benign 08/04/2014    Past Surgical History:  Procedure Laterality Date  . APPENDECTOMY    . CARDIOVASCULAR STRESS TEST  03/2014   Borderline reversible ischemic changes at the apex.  Normal LV  contractility/EF 52%.  . CORONARY STENT PLACEMENT  7/12013    Social History   Tobacco Use  . Smoking status: Former Smoker    Years: 0.00    Types: Cigarettes    Last attempt to quit: 11/26/1996    Years since quitting: 20.8  . Smokeless tobacco: Never Used  Substance Use Topics  . Alcohol use: No    Family History  Problem Relation Age of Onset  . Heart disease Mother   . Heart disease Father      Current medication list and allergy/intolerance information reviewed:    Current Outpatient Medications  Medication Sig Dispense Refill  . albuterol (PROVENTIL) (5 MG/ML) 0.5% nebulizer solution Take 0.5 mLs (2.5 mg total) by nebulization every 6 (six) hours as needed for wheezing or shortness of breath. 20 mL 3  . Albuterol Sulfate (PROAIR RESPICLICK) 277 (90 Base) MCG/ACT AEPB Inhale 1 puff into the lungs every 6 (six) hours as needed (sob). 1 each 1  . AMBULATORY NON FORMULARY MEDICATION Medication Name: Glucometer, lancets and strip to test every other day. Dx: Diabetes type 2, E11.9. 100 strip and 100 lancets 1 Units PRN  . aspirin 81 MG tablet Take 81 mg by mouth daily.    . cyclobenzaprine (FLEXERIL) 5 MG tablet Take 1 tablet (5 mg total) by mouth 3 (three) times daily as needed for muscle spasms. 30 tablet 1  . Fluticasone-Umeclidin-Vilant (TRELEGY ELLIPTA) 100-62.5-25 MCG/INH AEPB Inhale 1 puff into the lungs daily. 1 each 5  . furosemide (LASIX) 40 MG tablet Take 2 tablets by mouth 2 (two) times daily.  0  . ipratropium-albuterol (DUONEB) 0.5-2.5 (3) MG/3ML SOLN Take 3  mLs by nebulization every 2 (two) hours as needed (wheeze, SOB). 60 mL 3  . losartan (COZAAR) 50 MG tablet Take 1 tablet (50 mg total) by mouth daily. 90 tablet 1  . metoprolol succinate (TOPROL-XL) 50 MG 24 hr tablet Take 1 tablet (50 mg total) by mouth 2 (two) times daily. Take with or immediately following a meal. 60 tablet 0  . potassium chloride (K-DUR) 10 MEQ tablet Take 1 tablet (10 mEq total) by mouth  daily.    . potassium chloride SA (K-DUR,KLOR-CON) 20 MEQ tablet Take 10 tablets by mouth daily.  0  . spironolactone (ALDACTONE) 25 MG tablet Take 1 tablet (25 mg total) by mouth daily.    . traMADol (ULTRAM) 50 MG tablet Take 1 tablet (50 mg total) by mouth every 8 (eight) hours as needed. 30 tablet 1  . warfarin (COUMADIN) 4 MG tablet Take 2-4 mg by mouth daily. Take 4 mg every day except on Sunday and Wednesday take 2 mg.    . atorvastatin (LIPITOR) 40 MG tablet Take 40 mg as directed by mouth.  2  . atorvastatin (LIPITOR) 80 MG tablet Take 1 tablet (80 mg total) by mouth daily at 6 PM. 30 tablet 3  . nitroGLYCERIN (NITROSTAT) 0.4 MG SL tablet Place 1 tablet (0.4 mg total) under the tongue every 5 (five) minutes x 3 doses as needed for chest pain. 25 tablet 3  . predniSONE (DELTASONE) 20 MG tablet Take 2 tablets (40 mg total) by mouth daily. (Patient not taking: Reported on 10/01/2017) 10 tablet 0   No current facility-administered medications for this visit.     Allergies  Allergen Reactions  . Ramipril     cough      Review of Systems:  Constitutional:  No  fever, no chills, No recent illness  HEENT: No  headache, no vision change, no hearing change, No sore throat, No  sinus pressure - no recent URI  Cardiac: No  chest pain, No  pressure, No palpitations  Respiratory:  No  shortness of breath. No  Cough  Gastrointestinal: No  abdominal pain, No  nausea, No  vomiting  Musculoskeletal: No new myalgia/arthralgia except neck pain as per HPI  Skin: No  Rash  Hem/Onc: No  easy bruising/bleeding, No  abnormal lymph node  Neurologic: No  weakness, No  dizziness, No  slurred speech/focal weakness/facial droop   Exam:  BP 121/70 (BP Location: Right Arm, Patient Position: Sitting, Cuff Size: Large)   Pulse 61   Temp 98.1 F (36.7 C) (Oral)   Resp 18   Wt (!) 318 lb 14.4 oz (144.7 kg)   BMI 44.48 kg/m   Constitutional: VS see above. General Appearance: alert,  well-developed, well-nourished, NAD  Eyes: Normal lids and conjunctive, non-icteric sclera  Ears, Nose, Mouth, Throat: MMM, Normal external inspection ears/nares/mouth/lips/gums. TM normal bilaterally. Pharynx/tonsils no erythema, no exudate. Nasal mucosa normal.   Neck: No masses, trachea midline. No thyroid enlargement. No tenderness/mass appreciated. No lymphadenopathy.   Area of pain (+)tenderness posterior to mastoid, but tenderness not consistent   ROM intact without pain F/E, SB R/L, Rot R/L, jaw movement  Respiratory: Normal respiratory effort.   Musculoskeletal: Gait normal. See neck exam above  Neurological: Normal balance/coordination. No tremor. No cranial nerve deficit on limited exam. Motor and sensation intact and symmetric. Cerebellar reflexes intact.   Skin: warm, dry, intact. No rash/ulcer.  Psychiatric: Normal judgment/insight. Normal mood and affect. Oriented x3.    No results found for this  or any previous visit (from the past 72 hour(s)).  Images personally reviewed  Dg Cervical Spine 2 Or 3 Views  Result Date: 10/01/2017 CLINICAL DATA:  Cervicalgia with right upper extremity radicular symptoms EXAM: CERVICAL SPINE - 2-3 VIEW COMPARISON:  None. FINDINGS: Frontal, lateral, and open-mouth odontoid images were obtained. There is no fracture or spondylolisthesis. Prevertebral soft tissues and predental space regions are normal. There is moderate disc space narrowing at C5-6 and C6-7. There are small anterior osteophytes at C5 and C6. There is calcification in the anterior ligament at C5-6. Lung apices are clear. There is calcification in each carotid artery. IMPRESSION: Lower cervical osteoarthritic change. No fracture or spondylolisthesis. Carotid artery calcification noted bilaterally. Electronically Signed   By: Lowella Grip III M.D.   On: 10/01/2017 12:08     ASSESSMENT/PLAN:   Symptoms not really consistent with shingles given coming/going pain. Location  not consistent with temporal arteritis. Another vascular problem such as carotidynia and associated issues would be more anterior/deep, no neuro findings on exam or complaints on history. Mastoiditis seems unlikely given relapsing/remitting nature of pain and no recent URI/other illness.   Consideration for likely muscle spasm vs TMJ variant. Would certainly consider imaging if no better/worse   Neck pain - Plan: DG Cervical Spine 2 or 3 views, ketorolac (TORADOL) injection 60 mg  Carotid artery calcification, bilateral - noted on XR C-spine 09/2017. Further imaging to follow     Patient Instructions  Plan:  Toradol shot in the office now  Can restart Aleve or Advil tomorrow  Can continue Tylenol for now  Will get Xray  Will try muscle relaxer   If not better tomorrow, try the Valium  Don't take Valium with Flexeril  Call me tomorrow or day after and let me know how you're doing  Can refer to physical therapy if needed   If worse/change - to ER!    Visit summary with medication list and pertinent instructions was printed for patient to review. All questions at time of visit were answered - patient instructed to contact office with any additional concerns. ER/RTC precautions were reviewed with the patient. Follow-up plan: Return if symptoms worsen or fail to improve.  Note: Total time spent 25 minutes, greater than 50% of the visit was spent face-to-face counseling and coordinating care for the following: The primary encounter diagnosis was Neck pain. A diagnosis of Carotid artery calcification, bilateral was also pertinent to this visit.Marland Kitchen  Please note: voice recognition software was used to produce this document, and typos may escape review. Please contact me for any needed clarifications.    Addendum: 10/01/2017, 2:56 PM Spoke with patient over the phone, he is feeling much better after shot of Toradol in the office. We'll go ahead and get carotid ultrasounds given  calcifications seen on x-ray, preferred [place] location if possible

## 2017-10-03 ENCOUNTER — Telehealth: Payer: Self-pay

## 2017-10-03 NOTE — Telephone Encounter (Signed)
Dr Sheppard Coil left message for patient to call back.

## 2017-10-03 NOTE — Telephone Encounter (Signed)
Jeffrey Larson called and the neck pain is not any better. He would like medication for the pain. Please advise.

## 2017-10-03 NOTE — Telephone Encounter (Signed)
When I spoke with him the other day re: Xray results, he reported feeling better! Has he tried the Cyclobenzaprine or the Valium? Is pain same or worse/different?

## 2017-10-15 ENCOUNTER — Ambulatory Visit (INDEPENDENT_AMBULATORY_CARE_PROVIDER_SITE_OTHER): Payer: BLUE CROSS/BLUE SHIELD

## 2017-10-15 ENCOUNTER — Encounter: Payer: Self-pay | Admitting: Osteopathic Medicine

## 2017-10-15 ENCOUNTER — Ambulatory Visit (INDEPENDENT_AMBULATORY_CARE_PROVIDER_SITE_OTHER): Payer: BLUE CROSS/BLUE SHIELD | Admitting: Osteopathic Medicine

## 2017-10-15 VITALS — BP 124/88 | HR 83 | Temp 98.2°F | Resp 20 | Wt 311.9 lb

## 2017-10-15 DIAGNOSIS — I517 Cardiomegaly: Secondary | ICD-10-CM

## 2017-10-15 DIAGNOSIS — I10 Essential (primary) hypertension: Secondary | ICD-10-CM

## 2017-10-15 DIAGNOSIS — I482 Chronic atrial fibrillation, unspecified: Secondary | ICD-10-CM

## 2017-10-15 DIAGNOSIS — R05 Cough: Secondary | ICD-10-CM

## 2017-10-15 DIAGNOSIS — R0602 Shortness of breath: Secondary | ICD-10-CM | POA: Diagnosis not present

## 2017-10-15 DIAGNOSIS — J441 Chronic obstructive pulmonary disease with (acute) exacerbation: Secondary | ICD-10-CM | POA: Diagnosis not present

## 2017-10-15 MED ORDER — PREDNISONE 20 MG PO TABS: 20.0000 mg | ORAL_TABLET | Freq: Two times a day (BID) | ORAL | 0 refills | Status: DC

## 2017-10-15 MED ORDER — AZITHROMYCIN 250 MG PO TABS: ORAL_TABLET | ORAL | 0 refills | Status: DC

## 2017-10-15 MED ORDER — BENZONATATE 200 MG PO CAPS: 200.0000 mg | ORAL_CAPSULE | Freq: Three times a day (TID) | ORAL | 0 refills | Status: DC | PRN

## 2017-10-15 NOTE — Progress Notes (Signed)
HPI: Jeffrey Larson is a 61 y.o. male who  has a past medical history of Afib ([place]), Coronary artery disease, History of pneumonia, Hyperlipidemia, Myocardial infarction ([place]), and Renal disorder.  he presents to [name] today, 10/15/17,  for chief complaint of:  Chief Complaint  Patient presents with  . URI    Chills,:SOB;Cough - clear x 3 dys    SOB and fever for a few days. Hx COPD. Feels like his usual exacerbations that he gets with a cold. Feeling a bit better from Saturday but cough and SOB bothering him.  Chills, no measured temp at home but taking Tylenol     Past medical, surgical, social and family history reviewed:  Patient Active Problem List   Diagnosis Date Noted  . Neck pain 10/01/2017  . Coronary artery disease 08/26/2017  . Controlled type 2 diabetes mellitus without complication, without long-term current use of insulin ([place]) 08/02/2017  . Cardiac disease 04/18/2017  . Atrial fibrillation ([place]) 02/25/2017  . Acute respiratory failure with hypoxia ([place])   . Acute on chronic combined systolic and diastolic CHF (congestive heart failure) ([place]) 11/12/2016  . Right shoulder pain 07/09/2016  . Bilateral low back pain without sciatica 04/30/2016  . Medication monitoring encounter 04/30/2016  . Hematuria, microscopic 04/30/2016  . Acute exacerbation of chronic obstructive pulmonary disease (COPD) ([place]) 12/22/2015  . COPD with chronic bronchitis ([place]) 09/08/2014  . Mitral valve stenosis 08/12/2014  . History of MI (myocardial infarction) 08/04/2014  . Atrial fibrillation with RVR ([place]) 08/04/2014  . Hyperlipidemia 08/04/2014  . Essential hypertension, benign 08/04/2014    Past Surgical History:  Procedure Laterality Date  . APPENDECTOMY    . CARDIOVASCULAR STRESS TEST  03/2014   Borderline reversible ischemic changes at the apex.  Normal LV contractility/EF 52%.  . CORONARY STENT PLACEMENT  7/12013  . LEFT HEART  CATHETERIZATION WITH CORONARY ANGIOGRAM N/A 06/12/2012   Performed by Clent Demark, MD at [facility] CATH LAB  . PERCUTANEOUS CORONARY STENT INTERVENTION (PCI-S) N/A 06/17/2012   Performed by Clent Demark, MD at [facility] CATH LAB    Social History   Tobacco Use  . Smoking status: Former Smoker    Years: 0.00    Types: Cigarettes    Last attempt to quit: 11/26/1996    Years since quitting: 20.8  . Smokeless tobacco: Never Used  Substance Use Topics  . Alcohol use: No    Family History  Problem Relation Age of Onset  . Heart disease Mother   . Heart disease Father      Current medication list and allergy/intolerance information reviewed:    Current Outpatient Medications  Medication Sig Dispense Refill  . albuterol (PROVENTIL) (5 MG/ML) 0.5% nebulizer solution Take 0.5 mLs (2.5 mg total) by nebulization every 6 (six) hours as needed for wheezing or shortness of breath. 20 mL 3  . Albuterol Sulfate (PROAIR RESPICLICK) 354 (90 Base) MCG/ACT AEPB Inhale 1 puff into the lungs every 6 (six) hours as needed (sob). 1 each 1  . AMBULATORY NON FORMULARY MEDICATION Medication Name: Glucometer, lancets and strip to test every other day. Dx: Diabetes type 2, E11.9. 100 strip and 100 lancets 1 Units PRN  . aspirin 81 MG tablet Take 81 mg by mouth daily.    Marland Kitchen atorvastatin (LIPITOR) 40 MG tablet Take 40 mg as directed by mouth.  2  . cyclobenzaprine (FLEXERIL) 10 MG tablet Take 0.5-1 tablets (5-10 mg total) 3 (three) times daily as needed  by mouth. 30 tablet 1  . diazepam (VALIUM) 5 MG tablet Take 1 tablet (5 mg total) every 6 (six) hours as needed by mouth for muscle spasms. 5 tablet 0  . Fluticasone-Umeclidin-Vilant (TRELEGY ELLIPTA) 100-62.5-25 MCG/INH AEPB Inhale 1 puff into the lungs daily. 1 each 5  . furosemide (LASIX) 40 MG tablet Take 2 tablets by mouth 2 (two) times daily.  0  . ipratropium-albuterol (DUONEB) 0.5-2.5 (3) MG/3ML SOLN Take 3 mLs by nebulization every 2 (two) hours as needed  (wheeze, SOB). 60 mL 3  . losartan (COZAAR) 50 MG tablet Take 1 tablet (50 mg total) by mouth daily. 90 tablet 1  . metoprolol succinate (TOPROL-XL) 50 MG 24 hr tablet Take 1 tablet (50 mg total) by mouth 2 (two) times daily. Take with or immediately following a meal. 60 tablet 0  . nitroGLYCERIN (NITROSTAT) 0.4 MG SL tablet Place 1 tablet (0.4 mg total) under the tongue every 5 (five) minutes x 3 doses as needed for chest pain. 25 tablet 3  . potassium chloride SA (K-DUR,KLOR-CON) 20 MEQ tablet Take 0.5 tablets daily by mouth.  0  . spironolactone (ALDACTONE) 25 MG tablet Take 1 tablet (25 mg total) by mouth daily.    . traMADol (ULTRAM) 50 MG tablet Take 1 tablet (50 mg total) by mouth every 8 (eight) hours as needed. 30 tablet 1  . warfarin (COUMADIN) 4 MG tablet Take 2-4 mg by mouth daily. Take 4 mg every day except on Sunday and Wednesday take 2 mg.     No current facility-administered medications for this visit.     Allergies  Allergen Reactions  . Ramipril     cough      Review of Systems:  Constitutional:  +subjective fever, +chills, +recent illness, No unintentional weight changes. +significant fatigue.   HEENT: No  headache, no vision change, no hearing change, +sore throat, +sinus pressure  Cardiac: No  chest pain, No  pressure, No palpitations,  Respiratory:  +shortness of breath. +Cough  Gastrointestinal: No  abdominal pain, No  nausea, No  vomiting,  Musculoskeletal: No new myalgia/arthralgia  Skin: No  Rash,  Neurologic: No  weakness, No  dizziness   Exam:  BP 124/88 (BP Location: Right Arm, Patient Position: Sitting, Cuff Size: Large)   Pulse 83   Temp 98.2 F (36.8 C) (Oral)   Resp 20   Wt (!) 311 lb 14.4 oz (141.5 kg)   SpO2 94%   BMI 43.50 kg/m   Constitutional: VS see above. General Appearance: alert, well-developed, well-nourished, NAD  Eyes: Normal lids and conjunctive, non-icteric sclera  Ears, Nose, Mouth, Throat: MMM, Normal external  inspection ears/nares/mouth/lips/gums. TM normal bilaterally. Pharynx/tonsils +erythema, no exudate. Nasal mucosa normal.   Neck: No masses, trachea midline. No thyroid enlargement. No tenderness/mass appreciated. No lymphadenopathy  Respiratory: Normal respiratory effort. no wheeze, no rhonchi, no rales  Cardiovascular: S1/S2 normal, no murmur, no rub/gallop auscultated. Irreg/Irreg. No lower extremity edema. Pedal pulse II/IV bilaterally DP and PT.   Musculoskeletal: Gait normal.   Neurological: Normal balance/coordination. No tremor.   Skin: warm, dry, intact. No rash/ulcer.   Psychiatric: Normal judgment/insight. Normal mood and affect. Oriented x3.     CXR on personal review Cardiomediastinal silhouette/heart size: enlarged but stable Obvious bony abnormality: none Infiltrate: appears a bit worse on R from previous ?exposure Mass or other opacity: none Diaphragms: normal Lateral view: normal Images were reviewed with the patient. Pt counseled that radiologist will review the images as well, our office  will call if the formal read reveals any significant findings other than what has been noted above.     ASSESSMENT/PLAN:   Acute exacerbation of chronic obstructive pulmonary disease (COPD) ([place]) - advised use Duonebs prn instead fo albuterol, continue maintenance inhalers  - Plan: DG Chest 2 View, predniSONE (DELTASONE) 20 MG tablet, azithromycin (ZITHROMAX) 250 MG tablet, benzonatate (TESSALON) 200 MG capsule  Chronic atrial fibrillation (HCC) - HR ok today   Essential hypertension, benign - weight a bit up - watch this. Declines labs today, is going to cardiology soon and will have them send me results       Visit summary with medication list and pertinent instructions was printed for patient to review. All questions at time of visit were answered - patient instructed to contact office with any additional concerns. ER/RTC precautions were reviewed with the patient.  Follow-up plan: Return if symptoms worsen or fail to improve.  Note: Total time spent 25 minutes, greater than 50% of the visit was spent face-to-face counseling and coordinating care for the following: The primary encounter diagnosis was Acute exacerbation of chronic obstructive pulmonary disease (COPD) ([place]). A diagnosis of Chronic atrial fibrillation (HCC) was also pertinent to this visit.Marland Kitchen  Please note: voice recognition software was used to produce this document, and typos may escape review. Please contact Dr. Sheppard Coil for any needed clarifications.

## 2017-10-21 ENCOUNTER — Encounter: Payer: Self-pay | Admitting: Physician Assistant

## 2017-10-21 ENCOUNTER — Ambulatory Visit (INDEPENDENT_AMBULATORY_CARE_PROVIDER_SITE_OTHER): Payer: BLUE CROSS/BLUE SHIELD | Admitting: Physician Assistant

## 2017-10-21 VITALS — BP 121/66 | HR 80 | Temp 98.3°F | Ht 71.0 in | Wt 300.0 lb

## 2017-10-21 DIAGNOSIS — I5043 Acute on chronic combined systolic (congestive) and diastolic (congestive) heart failure: Secondary | ICD-10-CM

## 2017-10-21 DIAGNOSIS — J441 Chronic obstructive pulmonary disease with (acute) exacerbation: Secondary | ICD-10-CM | POA: Diagnosis not present

## 2017-10-21 MED ORDER — PREDNISONE 20 MG PO TABS: ORAL_TABLET | ORAL | 0 refills | Status: DC

## 2017-10-21 NOTE — Patient Instructions (Signed)
Wednesday Dr. Sheppard Coil.

## 2017-10-21 NOTE — Progress Notes (Signed)
Subjective:    Patient ID: Jeffrey Larson, male    DOB: [birth year], 61 y.o.   MRN: [id number]  HPI  Pt is a 61 yo pleasant male with hs of MI, HTN, CHF, Atrial fibrillation, and COPD who presents to the clinic to follow up on persisent cough and SOB for the last 3 weeks.   He was seen by PCP on 11/20 and treated for COPD exacerbation with zpak, prednisone burst, tessalon pearls and after CXR showed some fluid build up on lungs increased lasix to 3 times a day. He does report to feel better but he still has productive cough, constant SOB. Denies any wheezing. He is using his duoneb every 2 to 3 hours with relief. Yesterday finished zpak. He is down 11lbs since 11/20 visit increasing lasix. He is taking Trelogy daily.   .. Active Ambulatory Problems    Diagnosis Date Noted  . History of MI (myocardial infarction) 08/04/2014  . Atrial fibrillation with RVR ([place]) 08/04/2014  . Hyperlipidemia 08/04/2014  . Essential hypertension, benign 08/04/2014  . Mitral valve stenosis 08/12/2014  . COPD with chronic bronchitis ([place]) 09/08/2014  . Acute exacerbation of chronic obstructive pulmonary disease (COPD) ([place]) 12/22/2015  . Bilateral low back pain without sciatica 04/30/2016  . Medication monitoring encounter 04/30/2016  . Hematuria, microscopic 04/30/2016  . Right shoulder pain 07/09/2016  . Acute on chronic combined systolic and diastolic CHF (congestive heart failure) ([place]) 11/12/2016  . Acute respiratory failure with hypoxia ([place])   . Atrial fibrillation ([place]) 02/25/2017  . Cardiac disease 04/18/2017  . Controlled type 2 diabetes mellitus without complication, without long-term current use of insulin ([place]) 08/02/2017  . Coronary artery disease 08/26/2017  . Neck pain 10/01/2017   Resolved Ambulatory Problems    Diagnosis Date Noted  . CAP (community acquired pneumonia) 08/04/2014  . Right shoulder injury 07/20/2015  . Fracture of acromion of scapula 07/20/2015   Past Medical  History:  Diagnosis Date  . Afib ([place])   . Coronary artery disease   . History of pneumonia   . Hyperlipidemia   . Myocardial infarction ([place])   . Renal disorder        Review of Systems    see HPI.  Objective:   Physical Exam  Constitutional: He is oriented to person, place, and time. He appears well-developed and well-nourished.  Obese.   HENT:  Head: Normocephalic and atraumatic.  Right Ear: External ear normal.  Left Ear: External ear normal.  Nose: Nose normal.  Mouth/Throat: Oropharynx is clear and moist. No oropharyngeal exudate.  Eyes: Conjunctivae are normal.  Neck: Normal range of motion. Neck supple.  Cardiovascular: Normal rate.  Pulmonary/Chest:  Cough with every deep breath on ausculation.  Coarse breath sounds throughout both lungs.  No wheezing.  Lymphadenopathy:    He has no cervical adenopathy.  Neurological: He is alert and oriented to person, place, and time.  Skin:  Bilateral pitting edema looks good at 1/2 +.   Psychiatric: He has a normal mood and affect. His behavior is normal.          Assessment & Plan:  Marland KitchenMarland KitchenBrode was seen today for cough, copd and shortness of breath.  Diagnoses and all orders for this visit:  COPD exacerbation ([place]) -     predniSONE (DELTASONE) 20 MG tablet; Take 3 tablets for 3 days, and take 2 tablets for 3 days, take 1 tablet for 3 days, take 1/2 tablet for 4 days.  Acute on chronic combined systolic  and diastolic CHF (congestive heart failure) (HCC) -     Brain natriuretic peptide -     BASIC METABOLIC PANEL WITH GFR   Pt vitals are stable. BP good. Pulse ox 94 percent not to baseline but unchanged since last visit. HR in 80's. Pt does not appear to be fluid overloaded and down 11lbs since 11/20 visit with lasix increase. Continue on lasix 3 times a day for now until follow up in 2 days with PCP. I believe symptoms today represent more of a COPD exacerbation. Will add longer prednisone taper. Continue Trelogy and  duoneb as needed. I did not repeat CXR today. I will check BNP to compare to baseline and BMP to confirm kidneys look good with lasix increase.   Follow up closely until feeling better. Next visit with PCP on Wednesday.

## 2017-10-24 ENCOUNTER — Ambulatory Visit (INDEPENDENT_AMBULATORY_CARE_PROVIDER_SITE_OTHER): Payer: BLUE CROSS/BLUE SHIELD

## 2017-10-24 ENCOUNTER — Ambulatory Visit (INDEPENDENT_AMBULATORY_CARE_PROVIDER_SITE_OTHER): Payer: BLUE CROSS/BLUE SHIELD | Admitting: Osteopathic Medicine

## 2017-10-24 ENCOUNTER — Encounter: Payer: Self-pay | Admitting: Osteopathic Medicine

## 2017-10-24 VITALS — BP 104/71 | HR 73 | Temp 97.7°F | Resp 18 | Wt 303.2 lb

## 2017-10-24 DIAGNOSIS — I517 Cardiomegaly: Secondary | ICD-10-CM | POA: Diagnosis not present

## 2017-10-24 DIAGNOSIS — J441 Chronic obstructive pulmonary disease with (acute) exacerbation: Secondary | ICD-10-CM | POA: Diagnosis not present

## 2017-10-24 DIAGNOSIS — J209 Acute bronchitis, unspecified: Secondary | ICD-10-CM | POA: Diagnosis not present

## 2017-10-24 DIAGNOSIS — I5043 Acute on chronic combined systolic (congestive) and diastolic (congestive) heart failure: Secondary | ICD-10-CM

## 2017-10-24 DIAGNOSIS — R05 Cough: Secondary | ICD-10-CM

## 2017-10-24 DIAGNOSIS — J440 Chronic obstructive pulmonary disease with acute lower respiratory infection: Secondary | ICD-10-CM | POA: Diagnosis not present

## 2017-10-24 DIAGNOSIS — R059 Cough, unspecified: Secondary | ICD-10-CM

## 2017-10-24 DIAGNOSIS — Z7901 Long term (current) use of anticoagulants: Secondary | ICD-10-CM | POA: Diagnosis not present

## 2017-10-24 MED ORDER — GUAIFENESIN ER 600 MG PO TB12: 600.0000 mg | ORAL_TABLET | Freq: Two times a day (BID) | ORAL | 1 refills | Status: DC | PRN

## 2017-10-24 MED ORDER — IPRATROPIUM-ALBUTEROL 0.5-2.5 (3) MG/3ML IN SOLN: 3.0000 mL | RESPIRATORY_TRACT | 3 refills | Status: DC | PRN

## 2017-10-24 NOTE — Progress Notes (Signed)
HPI: Jeffrey Larson is a 61 y.o. male who  has a past medical history of Afib (Jeffrey Larson), Coronary artery disease, History of pneumonia, Hyperlipidemia, Myocardial infarction (Jeffrey Larson), and Renal disorder.  he presents to [facility] today, 10/24/17,  for chief complaint of:  Chief Complaint  Patient presents with  . Follow-up    COPD; CHF; SOB    Saw Jeffrey Larson on Monday 10/21/17 (3 days ago) when I was out of town - notes reviewed. He was seeing her for persistent cough issues. Today he states cough is only a little bit better. No fever or SOB.   He was seen by me on 11/20 and treated for COPD exacerbation with zpak, prednisone burst, tessalon pearls and after CXR showed some fluid build up on lungs I increased lasix to 3 times a day and he was down 11 lbs on 10/21/17. Jeffrey Larson gave additional steroid taper and advised repeat BMP/BNP to check renal fxn on Lasix increase and check CHF, but pt has not gotten labs.   He is taking the steroids, he is using nebulizer with just albuterol, probably not the duonebs, every few hours, he is using Trelegy routinely, he is on tid Lasix since sick, usually bid.     Past medical, surgical, social and family history reviewed: no updates needed    Current medication list and allergy/intolerance information reviewed:    Current Outpatient Medications  Medication Sig Dispense Refill  . albuterol (PROVENTIL) (5 MG/ML) 0.5% nebulizer solution Take 0.5 mLs (2.5 mg total) by nebulization every 6 (six) hours as needed for wheezing or shortness of breath. 20 mL 3  . Albuterol Sulfate (PROAIR RESPICLICK) 540 (90 Base) MCG/ACT AEPB Inhale 1 puff into the lungs every 6 (six) hours as needed (sob). 1 each 1  . AMBULATORY NON FORMULARY MEDICATION Medication Name: Glucometer, lancets and strip to test every other day. Dx: Diabetes type 2, E11.9. 100 strip and 100 lancets 1 Units PRN  . aspirin 81 MG tablet Take 81 mg by mouth daily.    Marland Kitchen  atorvastatin (LIPITOR) 40 MG tablet Take 40 mg as directed by mouth.  2  . cyclobenzaprine (FLEXERIL) 10 MG tablet Take 0.5-1 tablets (5-10 mg total) 3 (three) times daily as needed by mouth. 30 tablet 1  . diazepam (VALIUM) 5 MG tablet Take 1 tablet (5 mg total) every 6 (six) hours as needed by mouth for muscle spasms. 5 tablet 0  . Fluticasone-Umeclidin-Vilant (TRELEGY ELLIPTA) 100-62.5-25 MCG/INH AEPB Inhale 1 puff into the lungs daily. 1 each 5  . furosemide (LASIX) 40 MG tablet Take 2 tablets by mouth 2 (two) times daily.  0  . ipratropium-albuterol (DUONEB) 0.5-2.5 (3) MG/3ML SOLN Take 3 mLs by nebulization every 2 (two) hours as needed (wheeze, SOB). 60 mL 3  . losartan (COZAAR) 50 MG tablet Take 1 tablet (50 mg total) by mouth daily. 90 tablet 1  . metoprolol succinate (TOPROL-XL) 50 MG 24 hr tablet Take 1 tablet (50 mg total) by mouth 2 (two) times daily. Take with or immediately following a meal. 60 tablet 0  . nitroGLYCERIN (NITROSTAT) 0.4 MG SL tablet Place 1 tablet (0.4 mg total) under the tongue every 5 (five) minutes x 3 doses as needed for chest pain. 25 tablet 3  . potassium chloride SA (K-DUR,KLOR-CON) 20 MEQ tablet Take 0.5 tablets daily by mouth.  0  . predniSONE (DELTASONE) 20 MG tablet Take 3 tablets for 3 days, and take 2 tablets for 3 days, take  1 tablet for 3 days, take 1/2 tablet for 4 days. 20 tablet 0  . spironolactone (ALDACTONE) 25 MG tablet Take 1 tablet (25 mg total) by mouth daily.    . traMADol (ULTRAM) 50 MG tablet Take 1 tablet (50 mg total) by mouth every 8 (eight) hours as needed. 30 tablet 1  . warfarin (COUMADIN) 4 MG tablet Take 2-4 mg by mouth daily. Take 4 mg every day except on Sunday and Wednesday take 2 mg.     No current facility-administered medications for this visit.     Allergies  Allergen Reactions  . Ramipril     cough      Review of Systems:  Constitutional:  No  fever, no chills, +recent illness, No unintentional weight changes.  +significant fatigue.   HEENT: No  headache, no vision change  Cardiac: No  chest pain, No  pressure, No palpitations, No  Orthopnea  Respiratory:  No  shortness of breath. +Cough  Gastrointestinal: No  abdominal pain, No  nausea, No  vomiting  Musculoskeletal: No new myalgia/arthralgia  Skin: No  Rash  Neurologic: No  weakness, No  dizziness   Exam:  BP 104/71 (BP Location: Right Arm, Patient Position: Sitting, Cuff Size: Large)   Pulse 73   Temp 97.7 F (36.5 C) (Oral)   Resp 18   Wt (!) 303 lb 3.2 oz (137.5 kg)   SpO2 96%   BMI 42.29 kg/m   Constitutional: VS see above. General Appearance: alert, well-developed, well-nourished, NAD  Eyes: Normal lids and conjunctive, non-icteric sclera  Ears, Nose, Mouth, Throat: MMM, Normal external inspection ears/nares/mouth/lips/gums.  Neck: No masses, trachea midline. No thyroid enlargement. No tenderness/mass appreciated. No lymphadenopathy  Respiratory: Normal respiratory effort. +wheeze, no rhonchi, no rales  Cardiovascular: S1/S2 normal, no murmur, no rub/gallop auscultated. RRR. Trace lower extremity edema.   Musculoskeletal: Gait normal.  Neurological: Normal balance/coordination. No tremor.   Skin: warm, dry, intact. No rash/ulcer.   Psychiatric: Normal judgment/insight. Normal mood and affect. Oriented x3.    No results found for this or any previous visit (from the past 72 hour(s)).  Dg Chest 2 View  Result Date: 10/24/2017 CLINICAL DATA:  Persistent cough for 2-3 week EXAM: CHEST  2 VIEW COMPARISON:  Chest x-ray of 10/15/2017 FINDINGS: There is little change in the degree of cardiomegaly and pulmonary vascular congestion with some fluid noted along the major fissure on the lateral view. No pneumonia or definite effusion is seen. There are degenerative changes throughout the thoracic spine. IMPRESSION: Little change in cardiomegaly and pulmonary vascular congestion. Electronically Signed   By: Jeffrey Larson M.D.    On: 10/24/2017 09:07    CXR images personally reviewed.     ASSESSMENT/PLAN:   Afebrile, SaO2 ok, BP ok or even a little low though not hypotensive. BMP and BNP pending. Didn't bother with CBC as WBC are going to be high anyway with steroids. Last A1C ok though of course more risk for immune compromise with DM2 and other medical history.    Make sure on duonebs, not just albuterol  Continue other meds unless labs bad  if renal fxn ok could increase diuresis  Cardio follow up soon, will get repeat Echo   ?pulm consult re: COPD, does he need chonic steroids?   Slowly improving    Cough - Plan: DG Chest 2 View, BASIC METABOLIC PANEL WITH GFR, B Nat Peptide  Acute exacerbation of chronic obstructive pulmonary disease (COPD) (HCC) - Plan: ipratropium-albuterol (DUONEB) 0.5-2.5 (3)  MG/3ML SOLN  Acute on chronic combined systolic and diastolic CHF (congestive heart failure) ([place]) - Plan: ECHOCARDIOGRAM COMPLETE      Visit summary with medication list and pertinent instructions was printed for patient to review. All questions at time of visit were answered - patient instructed to contact office with any additional concerns. ER/RTC precautions were reviewed with the patient. Follow-up plan: Return if symptoms worsen or fail to improve.  Note: Total time spent 40 minutes, greater than 50% of the visit was spent face-to-face counseling and coordinating care for the following: The primary encounter diagnosis was Cough. Diagnoses of Acute exacerbation of chronic obstructive pulmonary disease (COPD) ([place]) and Acute on chronic combined systolic and diastolic CHF (congestive heart failure) ([place]) were also pertinent to this visit.Marland Kitchen  Please note: voice recognition software was used to produce this document, and typos may escape review. Please contact Dr. Sheppard Coil for any needed clarifications.

## 2017-10-25 LAB — BASIC METABOLIC PANEL WITH GFR
BUN/Creatinine Ratio: 27 (calc) — ABNORMAL HIGH (ref 6–22)
BUN: 32 mg/dL — AB (ref 7–25)
CHLORIDE: 99 mmol/L (ref 98–110)
CO2: 28 mmol/L (ref 20–32)
Calcium: 9.4 mg/dL (ref 8.6–10.3)
Creat: 1.19 mg/dL (ref 0.70–1.25)
GFR, Est African American: 76 mL/min/{1.73_m2} (ref >=60)
GFR, Est Non African American: 66 mL/min/{1.73_m2} (ref >=60)
GLUCOSE: 105 mg/dL — AB (ref 65–99)
POTASSIUM: 4.6 mmol/L (ref 3.5–5.3)
Sodium: 138 mmol/L (ref 135–146)

## 2017-10-25 LAB — BRAIN NATRIURETIC PEPTIDE: Brain Natriuretic Peptide: 231 pg/mL — ABNORMAL HIGH (ref <100)

## 2017-10-28 ENCOUNTER — Encounter: Payer: Self-pay | Admitting: Osteopathic Medicine

## 2017-10-28 ENCOUNTER — Ambulatory Visit (INDEPENDENT_AMBULATORY_CARE_PROVIDER_SITE_OTHER): Payer: BLUE CROSS/BLUE SHIELD | Admitting: Osteopathic Medicine

## 2017-10-28 VITALS — BP 119/82 | HR 94 | Temp 98.0°F

## 2017-10-28 DIAGNOSIS — I5042 Chronic combined systolic (congestive) and diastolic (congestive) heart failure: Secondary | ICD-10-CM

## 2017-10-28 DIAGNOSIS — I10 Essential (primary) hypertension: Secondary | ICD-10-CM

## 2017-10-28 DIAGNOSIS — J441 Chronic obstructive pulmonary disease with (acute) exacerbation: Secondary | ICD-10-CM | POA: Diagnosis not present

## 2017-10-28 DIAGNOSIS — B001 Herpesviral vesicular dermatitis: Secondary | ICD-10-CM | POA: Diagnosis not present

## 2017-10-28 MED ORDER — GUAIFENESIN-DM 100-10 MG/5ML PO SYRP: 10.0000 mL | ORAL_SOLUTION | ORAL | 0 refills | Status: DC | PRN

## 2017-10-28 MED ORDER — VALACYCLOVIR HCL 1 G PO TABS: 2000.0000 mg | ORAL_TABLET | Freq: Two times a day (BID) | ORAL | 1 refills | Status: DC

## 2017-10-28 NOTE — Progress Notes (Signed)
HPI: Jeffrey Larson is a 61 y.o. male who  has a past medical history of Afib ([place]), Coronary artery disease, History of pneumonia, Hyperlipidemia, Myocardial infarction ([place]), and Renal disorder.  he presents to [facility] today, 10/28/17,  for chief complaint of:  Chief Complaint  Patient presents with  . COPD    Recently seen and treated for COPD exacerbation - symptoms pretty severe but no PNA on CXR, no SaO2 decrease. (+)Coughing today, he states he is breathing a lot better, feels like he has mucus he can't cough up. Has been using the Duonebs q4h and Mucinex.   CHF: following with cardiology, some question on whether he needs valve replacement. We are waiting on Echo, he has this scheduled for tomorrow. Renal Fxn ok to take Lasix tid  Cold sore: would like Rx for this   Past medical, surgical, social and family history reviewed:  Patient Active Problem List   Diagnosis Date Noted  . Neck pain 10/01/2017  . Coronary artery disease 08/26/2017  . Controlled type 2 diabetes mellitus without complication, without long-term current use of insulin ([place]) 08/02/2017  . Cardiac disease 04/18/2017  . Atrial fibrillation ([place]) 02/25/2017  . Acute respiratory failure with hypoxia ([place])   . Acute on chronic combined systolic and diastolic CHF (congestive heart failure) ([place]) 11/12/2016  . Right shoulder pain 07/09/2016  . Bilateral low back pain without sciatica 04/30/2016  . Medication monitoring encounter 04/30/2016  . Hematuria, microscopic 04/30/2016  . Acute exacerbation of chronic obstructive pulmonary disease (COPD) ([place]) 12/22/2015  . COPD with chronic bronchitis ([place]) 09/08/2014  . Mitral valve stenosis 08/12/2014  . History of MI (myocardial infarction) 08/04/2014  . Atrial fibrillation with RVR ([place]) 08/04/2014  . Hyperlipidemia 08/04/2014  . Essential hypertension, benign 08/04/2014    Past Surgical History:  Procedure Laterality Date   . APPENDECTOMY    . CARDIOVASCULAR STRESS TEST  03/2014   Borderline reversible ischemic changes at the apex.  Normal LV contractility/EF 52%.  . CORONARY STENT PLACEMENT  7/12013  . LEFT HEART CATHETERIZATION WITH CORONARY ANGIOGRAM N/A 06/12/2012   Procedure: LEFT HEART CATHETERIZATION WITH CORONARY ANGIOGRAM;  Surgeon: Clent Demark, MD;  Location: [facility] CATH LAB;  Service: Cardiovascular;  Laterality: N/A;  . PERCUTANEOUS CORONARY STENT INTERVENTION (PCI-S) N/A 06/17/2012   Procedure: PERCUTANEOUS CORONARY STENT INTERVENTION (PCI-S);  Surgeon: Clent Demark, MD;  Location: [facility] CATH LAB;  Service: Cardiovascular;  Laterality: N/A;    Social History   Tobacco Use  . Smoking status: Former Smoker    Years: 0.00    Types: Cigarettes    Last attempt to quit: 11/26/1996    Years since quitting: 20.9  . Smokeless tobacco: Never Used  Substance Use Topics  . Alcohol use: No    Family History  Problem Relation Age of Onset  . Heart disease Mother   . Heart disease Father      Current medication list and allergy/intolerance information reviewed:    Current Outpatient Medications  Medication Sig Dispense Refill  . albuterol (PROVENTIL) (5 MG/ML) 0.5% nebulizer solution Take 0.5 mLs (2.5 mg total) by nebulization every 6 (six) hours as needed for wheezing or shortness of breath. 20 mL 3  . Albuterol Sulfate (PROAIR RESPICLICK) 026 (90 Base) MCG/ACT AEPB Inhale 1 puff into the lungs every 6 (six) hours as needed (sob). 1 each 1  . AMBULATORY NON FORMULARY MEDICATION Medication Name: Glucometer, lancets and strip to test every other day. Dx: Diabetes  type 2, E11.9. 100 strip and 100 lancets 1 Units PRN  . aspirin 81 MG tablet Take 81 mg by mouth daily.    Marland Kitchen atorvastatin (LIPITOR) 40 MG tablet Take 40 mg as directed by mouth.  2  . benzonatate (TESSALON) 200 MG capsule Take 1 capsule by mouth as needed.  0  . cyclobenzaprine (FLEXERIL) 10 MG tablet Take 0.5-1 tablets (5-10 mg total) 3  (three) times daily as needed by mouth. 30 tablet 1  . diazepam (VALIUM) 5 MG tablet Take 1 tablet (5 mg total) every 6 (six) hours as needed by mouth for muscle spasms. 5 tablet 0  . Fluticasone-Umeclidin-Vilant (TRELEGY ELLIPTA) 100-62.5-25 MCG/INH AEPB Inhale 1 puff into the lungs daily. 1 each 5  . furosemide (LASIX) 40 MG tablet Take 2 tablets by mouth 2 (two) times daily.  0  . ipratropium-albuterol (DUONEB) 0.5-2.5 (3) MG/3ML SOLN Take 3 mLs by nebulization every 2 (two) hours as needed (wheeze, SOB). 60 mL 3  . losartan (COZAAR) 50 MG tablet Take 1 tablet (50 mg total) by mouth daily. 90 tablet 1  . metoprolol succinate (TOPROL-XL) 50 MG 24 hr tablet Take 1 tablet (50 mg total) by mouth 2 (two) times daily. Take with or immediately following a meal. 60 tablet 0  . potassium chloride SA (K-DUR,KLOR-CON) 20 MEQ tablet Take 0.5 tablets daily by mouth.  0  . predniSONE (DELTASONE) 20 MG tablet Take 3 tablets for 3 days, and take 2 tablets for 3 days, take 1 tablet for 3 days, take 1/2 tablet for 4 days. 20 tablet 0  . spironolactone (ALDACTONE) 25 MG tablet Take 1 tablet (25 mg total) by mouth daily.    . traMADol (ULTRAM) 50 MG tablet Take 1 tablet (50 mg total) by mouth every 8 (eight) hours as needed. 30 tablet 1  . warfarin (COUMADIN) 4 MG tablet Take 2-4 mg by mouth daily. Take 4 mg every day except on Sunday and Wednesday take 2 mg.    . nitroGLYCERIN (NITROSTAT) 0.4 MG SL tablet Place 1 tablet (0.4 mg total) under the tongue every 5 (five) minutes x 3 doses as needed for chest pain. 25 tablet 3   No current facility-administered medications for this visit.     Allergies  Allergen Reactions  . Ramipril     cough      Review of Systems:  Constitutional:  No  fever, no chills, +recent illness, No unintentional weight changes. No significant fatigue.   HEENT: No  headache, no vision change, no hearing change, No sore throat, No  sinus pressure  Cardiac: No  chest pain, No   pressure, No palpitations, No  Orthopnea  Respiratory:  +shortness of breath. +Cough  Gastrointestinal: No  abdominal pain, No  nausea  Neurologic: No  weakness, No  dizziness,  Psychiatric: No  concerns with depression, No  concerns with anxiety, No sleep problems, No mood problems  Exam:  BP 119/82   Pulse 94   Temp 98 F (36.7 C) (Oral)   SpO2 96%   Constitutional: VS see above. General Appearance: alert, well-developed, well-nourished, NAD  Eyes: Normal lids and conjunctive, non-icteric sclera  Ears, Nose, Mouth, Throat: MMM, Normal external inspection ears/nares/mouth/lips/gums.   Neck: No masses, trachea midline.   Respiratory: Normal respiratory effort. +diffuse wheeze, no rhonchi, no rales  Cardiovascular: S1/S2 normal, no murmur, no rub/gallop auscultated.  Musculoskeletal: Gait normal.   Neurological: Normal balance/coordination.   Skin: warm, dry. (+)cold sore blisters crusting on lower lip  Psychiatric: Normal judgment/insight. Normal mood and affect. Oriented x3.    ASSESSMENT/PLAN:   Acute exacerbation of chronic obstructive pulmonary disease (COPD) (HCC) - Exacerbation resolving. Cont pulm treatment. I think could benefit from pulmonary consult given severity of symptoms and frequency of exacerbations.  - Plan: Ambulatory referral to Pulmonology  Essential hypertension, benign - No dizziness/CP, conitnue current meds unless told otherwise by cardio  Chronic combined systolic and diastolic heart failure ([place]) - Echo repeat pending, ?valve disease contributing to SOB symptoms, f/u w/ Cardio  Cold sore - prn therapy sent    Meds ordered this encounter  Medications  . valACYclovir (VALTREX) 1000 MG tablet    Sig: Take 2 tablets (2,000 mg total) by mouth 2 (two) times daily. For one day, as needed for cold sores. Start at first sign of outbreak.    Dispense:  12 tablet    Refill:  1  . guaiFENesin-dextromethorphan (ROBITUSSIN DM) 100-10 MG/5ML syrup     Sig: Take 10 mLs by mouth every 4 (four) hours as needed for cough.    Dispense:  236 mL    Refill:  0     Visit summary with medication list and pertinent instructions was printed for patient to review. All questions at time of visit were answered - patient instructed to contact office with any additional concerns. ER/RTC precautions were reviewed with the patient. Follow-up plan: Return in about 3 months (around 01/26/2018) for follow up breathing and heart - sooner if needed.  Note: Total time spent 25 minutes, greater than 50% of the visit was spent face-to-face counseling and coordinating care for the following: The primary encounter diagnosis was Acute exacerbation of chronic obstructive pulmonary disease (COPD) ([place]). Diagnoses of Essential hypertension, benign, Chronic combined systolic and diastolic heart failure ([place]), and Cold sore were also pertinent to this visit.Marland Kitchen  Please note: voice recognition software was used to produce this document, and typos may escape review. Please contact Dr. Sheppard Coil for any needed clarifications.

## 2017-10-29 ENCOUNTER — Ambulatory Visit (HOSPITAL_COMMUNITY)
Admission: RE | Admit: 2017-10-29 | Discharge: 2017-10-29 | Disposition: A | Discharge status: Home / Self Care | Payer: BLUE CROSS/BLUE SHIELD | Source: Ambulatory Visit | Attending: Osteopathic Medicine | Admitting: Osteopathic Medicine

## 2017-10-29 ENCOUNTER — Telehealth: Payer: Self-pay | Admitting: Osteopathic Medicine

## 2017-10-29 DIAGNOSIS — I4891 Unspecified atrial fibrillation: Secondary | ICD-10-CM | POA: Insufficient documentation

## 2017-10-29 DIAGNOSIS — I110 Hypertensive heart disease with heart failure: Secondary | ICD-10-CM | POA: Insufficient documentation

## 2017-10-29 DIAGNOSIS — J449 Chronic obstructive pulmonary disease, unspecified: Secondary | ICD-10-CM | POA: Insufficient documentation

## 2017-10-29 DIAGNOSIS — I2510 Atherosclerotic heart disease of native coronary artery without angina pectoris: Secondary | ICD-10-CM | POA: Insufficient documentation

## 2017-10-29 DIAGNOSIS — I252 Old myocardial infarction: Secondary | ICD-10-CM | POA: Insufficient documentation

## 2017-10-29 DIAGNOSIS — E119 Type 2 diabetes mellitus without complications: Secondary | ICD-10-CM | POA: Diagnosis not present

## 2017-10-29 DIAGNOSIS — I050 Rheumatic mitral stenosis: Secondary | ICD-10-CM | POA: Insufficient documentation

## 2017-10-29 DIAGNOSIS — Z87891 Personal history of nicotine dependence: Secondary | ICD-10-CM | POA: Diagnosis not present

## 2017-10-29 DIAGNOSIS — E785 Hyperlipidemia, unspecified: Secondary | ICD-10-CM | POA: Diagnosis not present

## 2017-10-29 DIAGNOSIS — I5043 Acute on chronic combined systolic (congestive) and diastolic (congestive) heart failure: Secondary | ICD-10-CM | POA: Diagnosis not present

## 2017-10-29 MED ORDER — PERFLUTREN LIPID MICROSPHERE
1.0000 mL | INTRAVENOUS | Status: AC | PRN
  Administered 2017-10-29: 2 mL via INTRAVENOUS
  Filled 2017-10-29: qty 10

## 2017-10-29 NOTE — Telephone Encounter (Signed)
-----   Message from [name], DO sent at 10/29/2017  3:57 PM EST ----- Please call patient: Echo showed severe mitral valve thickening that's not moving very well When is follow-up with cardiology? This valve issue may be contributing to his symptoms

## 2017-10-29 NOTE — Telephone Encounter (Signed)
Called pt notified Echo---showed severe valve thickening that's not moving well and pt have an appointment to his cardiology.

## 2017-10-29 NOTE — Progress Notes (Signed)
*  PRELIMINARY RESULTS* Echocardiogram 2D Echocardiogram with definity has been performed.  [name] 10/29/2017, 3:04 PM

## 2017-11-14 ENCOUNTER — Ambulatory Visit (HOSPITAL_BASED_OUTPATIENT_CLINIC_OR_DEPARTMENT_OTHER)
Admission: RE | Admit: 2017-11-14 | Discharge: 2017-11-14 | Disposition: A | Discharge status: Home / Self Care | Payer: BLUE CROSS/BLUE SHIELD | Source: Ambulatory Visit | Attending: Osteopathic Medicine | Admitting: Osteopathic Medicine

## 2017-11-14 DIAGNOSIS — I2510 Atherosclerotic heart disease of native coronary artery without angina pectoris: Secondary | ICD-10-CM | POA: Diagnosis not present

## 2017-11-14 DIAGNOSIS — E785 Hyperlipidemia, unspecified: Secondary | ICD-10-CM | POA: Insufficient documentation

## 2017-11-14 DIAGNOSIS — I6523 Occlusion and stenosis of bilateral carotid arteries: Secondary | ICD-10-CM | POA: Diagnosis not present

## 2017-11-14 DIAGNOSIS — I10 Essential (primary) hypertension: Secondary | ICD-10-CM | POA: Diagnosis not present

## 2017-11-14 DIAGNOSIS — I4891 Unspecified atrial fibrillation: Secondary | ICD-10-CM | POA: Diagnosis not present

## 2017-11-28 ENCOUNTER — Ambulatory Visit: Payer: BLUE CROSS/BLUE SHIELD | Admitting: Osteopathic Medicine

## 2017-11-28 ENCOUNTER — Encounter: Payer: Self-pay | Admitting: Osteopathic Medicine

## 2017-11-28 VITALS — BP 125/90 | HR 46 | Wt 312.0 lb

## 2017-11-28 DIAGNOSIS — R7303 Prediabetes: Secondary | ICD-10-CM

## 2017-11-28 DIAGNOSIS — I482 Chronic atrial fibrillation, unspecified: Secondary | ICD-10-CM

## 2017-11-28 DIAGNOSIS — Z1159 Encounter for screening for other viral diseases: Secondary | ICD-10-CM

## 2017-11-28 DIAGNOSIS — I10 Essential (primary) hypertension: Secondary | ICD-10-CM | POA: Diagnosis not present

## 2017-11-28 DIAGNOSIS — J449 Chronic obstructive pulmonary disease, unspecified: Secondary | ICD-10-CM | POA: Diagnosis not present

## 2017-11-28 DIAGNOSIS — I252 Old myocardial infarction: Secondary | ICD-10-CM

## 2017-11-28 DIAGNOSIS — Z114 Encounter for screening for human immunodeficiency virus [HIV]: Secondary | ICD-10-CM

## 2017-11-28 LAB — POCT GLYCOSYLATED HEMOGLOBIN (HGB A1C): HEMOGLOBIN A1C: 6

## 2017-11-28 NOTE — Patient Instructions (Signed)
Prediabetes Prediabetes is the condition of having a blood sugar (blood glucose) level that is higher than it should be, but not high enough for you to be diagnosed with type 2 diabetes. Having prediabetes puts you at risk for developing type 2 diabetes (type 2 diabetes mellitus). Prediabetes may be called impaired glucose tolerance or impaired fasting glucose. Prediabetes usually does not cause symptoms. Your health care provider can diagnose this condition with blood tests. You may be tested for prediabetes if you are overweight and if you have at least one other risk factor for prediabetes. Risk factors for prediabetes include:  Having a family member with type 2 diabetes.  Being overweight or obese.  Being older than age 57.  Being of American-Indian, African-American, Hispanic/Latino, or Asian/Pacific Islander descent.  Having an inactive (sedentary) lifestyle.  Having a history of gestational diabetes or polycystic ovarian syndrome (PCOS).  Having low levels of good cholesterol (HDL-C) or high levels of blood fats (triglycerides).  Having high blood pressure.  What is blood glucose and how is blood glucose measured?  Blood glucose refers to the amount of glucose in your bloodstream. Glucose comes from eating foods that contain sugars and starches (carbohydrates) that the body breaks down into glucose. Your blood glucose level may be measured in mg/dL (milligrams per deciliter) or mmol/L (millimoles per liter).Your blood glucose may be checked with one or more of the following blood tests:  A fasting blood glucose (FBG) test. You will not be allowed to eat (you will fast) for at least 8 hours before a blood sample is taken. ? A normal range for FBG is 70-100 mg/dl (3.9-5.6 mmol/L).  An A1c (hemoglobin A1c) blood test. This test provides information about blood glucose control over the previous 2?68month.  An oral glucose tolerance test (OGTT). This test measures your blood  glucose twice: ? After fasting. This is your baseline level. ? Two hours after you drink a beverage that contains glucose.  You may be diagnosed with prediabetes:  If your FBG is 100?125 mg/dL (5.6-6.9 mmol/L).  If your A1c level is 5.7?6.4%.  If your OGGT result is 140?199 mg/dL (7.8-11 mmol/L).  These blood tests may be repeated to confirm your diagnosis. What happens if blood glucose is too high? The pancreas produces a hormone (insulin) that helps move glucose from the bloodstream into cells. When cells in the body do not respond properly to insulin that the body makes (insulin resistance), excess glucose builds up in the blood instead of going into cells. As a result, high blood glucose (hyperglycemia) can develop, which can cause many complications. This is a symptom of prediabetes. What can happen if blood glucose stays higher than normal for a long time? Having high blood glucose for a long time is dangerous. Too much glucose in your blood can damage your nerves and blood vessels. Long-term damage can lead to complications from diabetes, which may include:  Heart disease.  Stroke.  Blindness.  Kidney disease.  Depression.  Poor circulation in the feet and legs, which could lead to surgical removal (amputation) in severe cases.  How can prediabetes be prevented from turning into type 2 diabetes?  To help prevent type 2 diabetes, take the following actions:  Be physically active. ? Do moderate-intensity physical activity for at least 30 minutes on at least 5 days of the week, or as much as told by your health care provider. This could be brisk walking, biking, or water aerobics. ? Ask your health care provider what  activities are safe for you. A mix of physical activities may be best, such as walking, swimming, cycling, and strength training.  Lose weight as told by your health care provider. ? Losing 5-7% of your body weight can reverse insulin resistance. ? Your health  care provider can determine how much weight loss is best for you and can help you lose weight safely.  Follow a healthy meal plan. This includes eating lean proteins, complex carbohydrates, fresh fruits and vegetables, low-fat dairy products, and healthy fats. ? Follow instructions from your health care provider about eating or drinking restrictions. ? Make an appointment to see a diet and nutrition specialist (registered dietitian) to help you create a healthy eating plan that is right for you.  Do not smoke or use any tobacco products, such as cigarettes, chewing tobacco, and e-cigarettes. If you need help quitting, ask your health care provider.  Take over-the-counter and prescription medicines as told by your health care provider. You may be prescribed medicines that help lower the risk of type 2 diabetes.  This information is not intended to replace advice given to you by your health care provider. Make sure you discuss any questions you have with your health care provider. Document Released: 03/05/2016 Document Revised: 04/19/2016 Document Reviewed: 01/03/2016 [employer] Interactive Patient Education  2018 [employer]  

## 2017-11-28 NOTE — Progress Notes (Signed)
HPI: Jeffrey Larson is a 62 y.o. male who  has a past medical history of Afib ([place]), Coronary artery disease, History of pneumonia, Hyperlipidemia, Myocardial infarction ([place]), and Renal disorder.  he presents to [facility] today, 11/28/17,  for chief complaint of:  Chief Complaint  Patient presents with  . Follow-up - DM2     DM2 was on his problem list but looking back at A1C he had one that was 6.6, no others I can find. At any rate, he has had PNA shot though we do not have it on record. Weight gain through the holidays - we discussed this a bit. Pt woul dlike to avoid adding further meds at this time.   DIABETES SCREENING/PREVENTIVE CARE: A1C past 3-6 mos: Yes  controlled? Yes   08/02/17: 6.6%  11/28/17: 6.0%  BP goal <009/38: systolic looks good today  LDL goal <70: needs Eye exam annually: needs Foot exam: 11/28/2017 Microalbuminuria:n/a on ARB Metformin: No  ACE/ARB: Yes  Antiplatelet if ASCVD Risk >10%: n/a on Warfarin Statin: Yes  Pneumovax: None on file     Never heard back from Pulm re: COPD - we sent referral 10/28/17. Breathing is a lot better. He states he had pneumovax few years ago, we don't have records.  CAD: following with cardiology. Recent echo and carotid US ok, (+)atherosclerosis but no stenosis needing intervention.       Past medical, surgical, social and family history reviewed:  Patient Active Problem List   Diagnosis Date Noted  . Cold sore 10/28/2017  . Neck pain 10/01/2017  . Coronary artery disease 08/26/2017  . Controlled type 2 diabetes mellitus without complication, without long-term current use of insulin ([place]) 08/02/2017  . Cardiac disease 04/18/2017  . Atrial fibrillation ([place]) 02/25/2017  . Acute respiratory failure with hypoxia ([place])   . Acute on chronic combined systolic and diastolic CHF (congestive heart failure) ([place]) 11/12/2016  . Right shoulder pain 07/09/2016  . Bilateral low back  pain without sciatica 04/30/2016  . Medication monitoring encounter 04/30/2016  . Hematuria, microscopic 04/30/2016  . Acute exacerbation of chronic obstructive pulmonary disease (COPD) ([place]) 12/22/2015  . COPD with chronic bronchitis ([place]) 09/08/2014  . Mitral valve stenosis 08/12/2014  . History of MI (myocardial infarction) 08/04/2014  . Atrial fibrillation with RVR ([place]) 08/04/2014  . Hyperlipidemia 08/04/2014  . Essential hypertension, benign 08/04/2014    Past Surgical History:  Procedure Laterality Date  . APPENDECTOMY    . CARDIOVASCULAR STRESS TEST  03/2014   Borderline reversible ischemic changes at the apex.  Normal LV contractility/EF 52%.  . CORONARY STENT PLACEMENT  7/12013  . LEFT HEART CATHETERIZATION WITH CORONARY ANGIOGRAM N/A 06/12/2012   Procedure: LEFT HEART CATHETERIZATION WITH CORONARY ANGIOGRAM;  Surgeon: Clent Demark, MD;  Location: [facility] CATH LAB;  Service: Cardiovascular;  Laterality: N/A;  . PERCUTANEOUS CORONARY STENT INTERVENTION (PCI-S) N/A 06/17/2012   Procedure: PERCUTANEOUS CORONARY STENT INTERVENTION (PCI-S);  Surgeon: Clent Demark, MD;  Location: [facility] CATH LAB;  Service: Cardiovascular;  Laterality: N/A;    Social History   Tobacco Use  . Smoking status: Former Smoker    Years: 0.00    Types: Cigarettes    Last attempt to quit: 11/26/1996    Years since quitting: 21.0  . Smokeless tobacco: Never Used  Substance Use Topics  . Alcohol use: No    Family History  Problem Relation Age of Onset  . Heart disease Mother   . Heart disease Father  Current medication list and allergy/intolerance information reviewed:    Current Outpatient Medications  Medication Sig Dispense Refill  . albuterol (PROVENTIL) (5 MG/ML) 0.5% nebulizer solution Take 0.5 mLs (2.5 mg total) by nebulization every 6 (six) hours as needed for wheezing or shortness of breath. 20 mL 3  . Albuterol Sulfate (PROAIR RESPICLICK) 619 (90 Base) MCG/ACT AEPB Inhale 1 puff into  the lungs every 6 (six) hours as needed (sob). 1 each 1  . AMBULATORY NON FORMULARY MEDICATION Medication Name: Glucometer, lancets and strip to test every other day. Dx: Diabetes type 2, E11.9. 100 strip and 100 lancets 1 Units PRN  . aspirin 81 MG tablet Take 81 mg by mouth daily.    Marland Kitchen atorvastatin (LIPITOR) 40 MG tablet Take 40 mg as directed by mouth.  2  . benzonatate (TESSALON) 200 MG capsule Take 1 capsule by mouth as needed.  0  . cyclobenzaprine (FLEXERIL) 10 MG tablet Take 0.5-1 tablets (5-10 mg total) 3 (three) times daily as needed by mouth. 30 tablet 1  . Fluticasone-Umeclidin-Vilant (TRELEGY ELLIPTA) 100-62.5-25 MCG/INH AEPB Inhale 1 puff into the lungs daily. 1 each 5  . furosemide (LASIX) 40 MG tablet Take 2 tablets by mouth 2 (two) times daily.  0  . guaiFENesin-dextromethorphan (ROBITUSSIN DM) 100-10 MG/5ML syrup Take 10 mLs by mouth every 4 (four) hours as needed for cough. 236 mL 0  . ipratropium-albuterol (DUONEB) 0.5-2.5 (3) MG/3ML SOLN Take 3 mLs by nebulization every 2 (two) hours as needed (wheeze, SOB). 60 mL 3  . losartan (COZAAR) 50 MG tablet Take 1 tablet (50 mg total) by mouth daily. 90 tablet 1  . metoprolol succinate (TOPROL-XL) 50 MG 24 hr tablet Take 1 tablet (50 mg total) by mouth 2 (two) times daily. Take with or immediately following a meal. 60 tablet 0  . potassium chloride SA (K-DUR,KLOR-CON) 20 MEQ tablet Take 0.5 tablets daily by mouth.  0  . predniSONE (DELTASONE) 20 MG tablet Take 3 tablets for 3 days, and take 2 tablets for 3 days, take 1 tablet for 3 days, take 1/2 tablet for 4 days. 20 tablet 0  . spironolactone (ALDACTONE) 25 MG tablet Take 1 tablet (25 mg total) by mouth daily.    . traMADol (ULTRAM) 50 MG tablet Take 1 tablet (50 mg total) by mouth every 8 (eight) hours as needed. 30 tablet 1  . valACYclovir (VALTREX) 1000 MG tablet Take 2 tablets (2,000 mg total) by mouth 2 (two) times daily. For one day, as needed for cold sores. Start at first sign  of outbreak. 12 tablet 1  . warfarin (COUMADIN) 4 MG tablet Take 2-4 mg by mouth daily. Take 4 mg every day except on Sunday and Wednesday take 2 mg.    . nitroGLYCERIN (NITROSTAT) 0.4 MG SL tablet Place 1 tablet (0.4 mg total) under the tongue every 5 (five) minutes x 3 doses as needed for chest pain. 25 tablet 3   No current facility-administered medications for this visit.     Allergies  Allergen Reactions  . Ramipril     cough      Review of Systems:  Constitutional:  No  fever, no chills  HEENT: No  headache, no vision change  Cardiac: No  chest pain, No  pressure, No palpitations, No  Orthopnea  Respiratory:  No  shortness of breath. No  Cough  Musculoskeletal: No new myalgia/arthralgia    Neurologic: No  weakness, No  dizziness,  Psychiatric: No  concerns with depression,  No  concerns with anxiety,  Exam:  BP 125/90   Pulse (!) 46   Wt (!) 312 lb 0.6 oz (141.5 kg)   BMI 43.52 kg/m   Constitutional: VS see above. General Appearance: alert, well-developed, well-nourished, NAD  Eyes: Normal lids and conjunctive, non-icteric sclera  Ears, Nose, Mouth, Throat: MMM, Normal external inspection ears/nares/mouth/lips/gums.   Neck: No masses, trachea midline. No thyroid enlargement  Respiratory: Normal respiratory effort. no wheeze, no rhonchi, no rales  Cardiovascular: S1/S2 normal, no murmur, no rub/gallop auscultated. RRR.  Gastrointestinal: Nontender, no masses.   Musculoskeletal: Gait normal. No clubbing/cyanosis of digits.   Neurological: Normal balance/coordination. No tremor. Marland Kitchen    Psychiatric: Normal judgment/insight. Normal mood and affect. Oriented x3.    Results for orders placed or performed in visit on 11/28/17 (from the past 72 hour(s))  POCT HgB A1C     Status: None   Collection Time: 11/28/17  7:33 AM  Result Value Ref Range   Hemoglobin A1C 6.0       ASSESSMENT/PLAN:   Prediabetes - declined metformin, will watch A1c, discussed  diet/exercise  - Plan: CBC, COMPLETE METABOLIC PANEL WITH GFR, Lipid panel, POCT HgB A1C  Essential hypertension, benign  COPD with chronic bronchitis ([place]) - sent message to our referral coordinator to address this - pt hasn't heard back about referral placed last month  Chronic atrial fibrillation ([place])  History of MI (myocardial infarction)  Need for hepatitis C screening test - Plan: Hepatitis C antibody  Encounter for screening for HIV - Plan: HIV antibody    Patient Instructions  Prediabetes Prediabetes is the condition of having a blood sugar (blood glucose) level that is higher than it should be, but not high enough for you to be diagnosed with type 2 diabetes. Having prediabetes puts you at risk for developing type 2 diabetes (type 2 diabetes mellitus). Prediabetes may be called impaired glucose tolerance or impaired fasting glucose. Prediabetes usually does not cause symptoms. Your health care provider can diagnose this condition with blood tests. You may be tested for prediabetes if you are overweight and if you have at least one other risk factor for prediabetes. Risk factors for prediabetes include:  Having a family member with type 2 diabetes.  Being overweight or obese.  Being older than age 42.  Being of American-Indian, African-American, Hispanic/Latino, or Asian/Pacific Islander descent.  Having an inactive (sedentary) lifestyle.  Having a history of gestational diabetes or polycystic ovarian syndrome (PCOS).  Having low levels of good cholesterol (HDL-C) or high levels of blood fats (triglycerides).  Having high blood pressure.  What is blood glucose and how is blood glucose measured?  Blood glucose refers to the amount of glucose in your bloodstream. Glucose comes from eating foods that contain sugars and starches (carbohydrates) that the body breaks down into glucose. Your blood glucose level may be measured in mg/dL (milligrams per deciliter) or mmol/L  (millimoles per liter).Your blood glucose may be checked with one or more of the following blood tests:  A fasting blood glucose (FBG) test. You will not be allowed to eat (you will fast) for at least 8 hours before a blood sample is taken. ? A normal range for FBG is 70-100 mg/dl (3.9-5.6 mmol/L).  An A1c (hemoglobin A1c) blood test. This test provides information about blood glucose control over the previous 2?82months.  An oral glucose tolerance test (OGTT). This test measures your blood glucose twice: ? After fasting. This is your baseline level. ? Two hours  after you drink a beverage that contains glucose.  You may be diagnosed with prediabetes:  If your FBG is 100?125 mg/dL (5.6-6.9 mmol/L).  If your A1c level is 5.7?6.4%.  If your OGGT result is 140?199 mg/dL (7.8-11 mmol/L).  These blood tests may be repeated to confirm your diagnosis. What happens if blood glucose is too high? The pancreas produces a hormone (insulin) that helps move glucose from the bloodstream into cells. When cells in the body do not respond properly to insulin that the body makes (insulin resistance), excess glucose builds up in the blood instead of going into cells. As a result, high blood glucose (hyperglycemia) can develop, which can cause many complications. This is a symptom of prediabetes. What can happen if blood glucose stays higher than normal for a long time? Having high blood glucose for a long time is dangerous. Too much glucose in your blood can damage your nerves and blood vessels. Long-term damage can lead to complications from diabetes, which may include:  Heart disease.  Stroke.  Blindness.  Kidney disease.  Depression.  Poor circulation in the feet and legs, which could lead to surgical removal (amputation) in severe cases.  How can prediabetes be prevented from turning into type 2 diabetes?  To help prevent type 2 diabetes, take the following actions:  Be physically  active. ? Do moderate-intensity physical activity for at least 30 minutes on at least 5 days of the week, or as much as told by your health care provider. This could be brisk walking, biking, or water aerobics. ? Ask your health care provider what activities are safe for you. A mix of physical activities may be best, such as walking, swimming, cycling, and strength training.  Lose weight as told by your health care provider. ? Losing 5-7% of your body weight can reverse insulin resistance. ? Your health care provider can determine how much weight loss is best for you and can help you lose weight safely.  Follow a healthy meal plan. This includes eating lean proteins, complex carbohydrates, fresh fruits and vegetables, low-fat dairy products, and healthy fats. ? Follow instructions from your health care provider about eating or drinking restrictions. ? Make an appointment to see a diet and nutrition specialist (registered dietitian) to help you create a healthy eating plan that is right for you.  Do not smoke or use any tobacco products, such as cigarettes, chewing tobacco, and e-cigarettes. If you need help quitting, ask your health care provider.  Take over-the-counter and prescription medicines as told by your health care provider. You may be prescribed medicines that help lower the risk of type 2 diabetes.  This information is not intended to replace advice given to you by your health care provider. Make sure you discuss any questions you have with your health care provider. Document Released: 03/05/2016 Document Revised: 04/19/2016 Document Reviewed: 01/03/2016 Elsevier Interactive Patient Education  2018 [employer].     Visit summary with medication list and pertinent instructions was printed for patient to review. All questions at time of visit were answered - patient instructed to contact office with any additional concerns. ER/RTC precautions were reviewed with the patient.    Follow-up plan: Return in about 4 months (around 03/28/2018) for follow up prediabetes .  Note: Total time spent 25 minutes, greater than 50% of the visit was spent face-to-face counseling and coordinating care for the following: The primary encounter diagnosis was Prediabetes. Diagnoses of Essential hypertension, benign, COPD with chronic bronchitis ([place]), Chronic  atrial fibrillation ([place]), History of MI (myocardial infarction), Need for hepatitis C screening test, and Encounter for screening for HIV were also pertinent to this visit.Marland Kitchen  Please note: voice recognition software was used to produce this document, and typos may escape review. Please contact Dr. Sheppard Coil for any needed clarifications.

## 2017-12-16 DIAGNOSIS — Z7901 Long term (current) use of anticoagulants: Secondary | ICD-10-CM | POA: Diagnosis not present

## 2017-12-16 DIAGNOSIS — I482 Chronic atrial fibrillation: Secondary | ICD-10-CM | POA: Diagnosis not present

## 2017-12-16 DIAGNOSIS — I5020 Unspecified systolic (congestive) heart failure: Secondary | ICD-10-CM | POA: Diagnosis not present

## 2017-12-16 DIAGNOSIS — I119 Hypertensive heart disease without heart failure: Secondary | ICD-10-CM | POA: Diagnosis not present

## 2017-12-16 DIAGNOSIS — I2510 Atherosclerotic heart disease of native coronary artery without angina pectoris: Secondary | ICD-10-CM | POA: Diagnosis not present

## 2018-01-27 ENCOUNTER — Other Ambulatory Visit: Payer: Self-pay

## 2018-01-27 MED ORDER — FLUTICASONE-UMECLIDIN-VILANT 100-62.5-25 MCG/INH IN AEPB: 1.0000 | INHALATION_SPRAY | Freq: Every day | RESPIRATORY_TRACT | 5 refills | Status: DC

## 2018-02-11 DIAGNOSIS — Z7901 Long term (current) use of anticoagulants: Secondary | ICD-10-CM | POA: Diagnosis not present

## 2018-02-13 ENCOUNTER — Encounter: Payer: Self-pay | Admitting: Physician Assistant

## 2018-02-13 ENCOUNTER — Ambulatory Visit: Payer: BLUE CROSS/BLUE SHIELD | Admitting: Physician Assistant

## 2018-02-13 VITALS — BP 124/75 | HR 91 | Temp 98.3°F | Wt 312.0 lb

## 2018-02-13 DIAGNOSIS — J441 Chronic obstructive pulmonary disease with (acute) exacerbation: Secondary | ICD-10-CM | POA: Diagnosis not present

## 2018-02-13 DIAGNOSIS — R0902 Hypoxemia: Secondary | ICD-10-CM | POA: Diagnosis not present

## 2018-02-13 MED ORDER — METHYLPREDNISOLONE SODIUM SUCC 125 MG IJ SOLR
125.0000 mg | Freq: Once | INTRAMUSCULAR | Status: AC
  Administered 2018-02-13: 125 mg via INTRAMUSCULAR

## 2018-02-13 MED ORDER — IPRATROPIUM-ALBUTEROL 0.5-2.5 (3) MG/3ML IN SOLN
3.0000 mL | Freq: Once | RESPIRATORY_TRACT | Status: AC
  Administered 2018-02-13: 3 mL via RESPIRATORY_TRACT

## 2018-02-13 MED ORDER — DOXYCYCLINE HYCLATE 100 MG PO TABS: 100.0000 mg | ORAL_TABLET | Freq: Two times a day (BID) | ORAL | 0 refills | Status: DC

## 2018-02-13 MED ORDER — PREDNISONE 20 MG PO TABS: 40.0000 mg | ORAL_TABLET | Freq: Every day | ORAL | 0 refills | Status: AC

## 2018-02-13 NOTE — Patient Instructions (Addendum)
Start steroid (Prednisone) tomorrow, and continue daily for 5 days Start antibiotic today and continue twice a day for 1 week You can increase your nebulizer treatments to every 4 hours as needed (Max 6 doses/day)  Come back in 1 week for INR check. Antibiotics can alter how well your blood thinner is working.   Chronic Obstructive Pulmonary Disease Exacerbation Chronic obstructive pulmonary disease (COPD) is a common lung problem. In COPD, the flow of air from the lungs is limited. COPD exacerbations are times that breathing gets worse and you need extra treatment. Without treatment they can be life threatening. If they happen often, your lungs can become more damaged. If your COPD gets worse, your doctor may treat you with:  Medicines.  Oxygen.  Different ways to clear your airway, such as using a mask.  Follow these instructions at home:  Do not smoke.  Avoid tobacco smoke and other things that bother your lungs.  If given, take your antibiotic medicine as told. Finish the medicine even if you start to feel better.  Only take medicines as told by your doctor.  Drink enough fluids to keep your pee (urine) clear or pale yellow (unless your doctor has told you not to).  Use a cool mist machine (vaporizer).  If you use oxygen or a machine that turns liquid medicine into a mist (nebulizer), continue to use them as told.  Keep up with shots (vaccinations) as told by your doctor.  Exercise regularly.  Eat healthy foods.  Keep all doctor visits as told. Get help right away if:  You are very short of breath and it gets worse.  You have trouble talking.  You have bad chest pain.  You have blood in your spit (sputum).  You have a fever.  You keep throwing up (vomiting).  You feel weak, or you pass out (faint).  You feel confused.  You keep getting worse. This information is not intended to replace advice given to you by your health care provider. Make sure you  discuss any questions you have with your health care provider. Document Released: 11/01/2011 Document Revised: 04/19/2016 Document Reviewed: 07/17/2013 Elsevier Interactive Patient Education  2017 [employer].

## 2018-02-13 NOTE — Progress Notes (Signed)
HPI:                                                                Jeffrey Larson is a 62 y.o. male who presents to [facility] [place] today for cough and shortness of   Cough  This is a recurrent problem. The current episode started in the past 7 days. The problem has been gradually worsening. The problem occurs every few minutes. The cough is productive of purulent sputum. Associated symptoms include shortness of breath. Pertinent negatives include no chest pain, fever, hemoptysis or sore throat. The symptoms are aggravated by exercise. Risk factors for lung disease include smoking/tobacco exposure. His past medical history is significant for COPD.    No flowsheet data found.    Past Medical History:  Diagnosis Date  . Afib ([place])   . Coronary artery disease   . History of pneumonia    RML on CXR 07/20/14  . Hyperlipidemia   . Myocardial infarction ([place])   . Renal disorder    history kidney stone   Past Surgical History:  Procedure Laterality Date  . APPENDECTOMY    . CARDIOVASCULAR STRESS TEST  03/2014   Borderline reversible ischemic changes at the apex.  Normal LV contractility/EF 52%.  . CORONARY STENT PLACEMENT  7/12013  . LEFT HEART CATHETERIZATION WITH CORONARY ANGIOGRAM N/A 06/12/2012   Procedure: LEFT HEART CATHETERIZATION WITH CORONARY ANGIOGRAM;  Surgeon: Clent Demark, MD;  Location[facility] CATH LAB;  Service: Cardiovascular;  Laterality: N/A;  . PERCUTANEOUS CORONARY STENT INTERVENTION (PCI-S) N/A 06/17/2012   Procedure: PERCUTANEOUS CORONARY STENT INTERVENTION (PCI-S);  Surgeon: Clent Demark, MD;  Location: [facility] CATH LAB;  Service: Cardiovascular;  Laterality: N/A;   Social History   Tobacco Use  . Smoking status: Former Smoker    Years: 0.00    Types: Cigarettes    Last attempt to quit: 11/26/1996    Years since quitting: 21.2  . Smokeless tobacco: Never Used  Substance Use Topics  . Alcohol use: No   family history  includes Heart disease in his father and mother.    ROS: negative except as noted in the HPI  Medications: Current Outpatient Medications  Medication Sig Dispense Refill  . albuterol (PROVENTIL) (5 MG/ML) 0.5% nebulizer solution Take 0.5 mLs (2.5 mg total) by nebulization every 6 (six) hours as needed for wheezing or shortness of breath. 20 mL 3  . Albuterol Sulfate (PROAIR RESPICLICK) 725 (90 Base) MCG/ACT AEPB Inhale 1 puff into the lungs every 6 (six) hours as needed (sob). 1 each 1  . AMBULATORY NON FORMULARY MEDICATION Medication Name: Glucometer, lancets and strip to test every other day. Dx: Diabetes type 2, E11.9. 100 strip and 100 lancets 1 Units PRN  . aspirin 81 MG tablet Take 81 mg by mouth daily.    Marland Kitchen atorvastatin (LIPITOR) 40 MG tablet Take 40 mg as directed by mouth.  2  . cyclobenzaprine (FLEXERIL) 10 MG tablet Take 0.5-1 tablets (5-10 mg total) 3 (three) times daily as needed by mouth. 30 tablet 1  . [START ON 02/14/2018] doxycycline (VIBRA-TABS) 100 MG tablet Take 1 tablet (100 mg total) by mouth 2 (two) times daily for 7 days. 14 tablet 0  . Fluticasone-Umeclidin-Vilant (TRELEGY ELLIPTA) 100-62.5-25  MCG/INH AEPB Inhale 1 puff into the lungs daily. 1 each 5  . furosemide (LASIX) 40 MG tablet Take 2 tablets by mouth 2 (two) times daily.  0  . ipratropium-albuterol (DUONEB) 0.5-2.5 (3) MG/3ML SOLN Take 3 mLs by nebulization every 2 (two) hours as needed (wheeze, SOB). 60 mL 3  . losartan (COZAAR) 50 MG tablet Take 1 tablet (50 mg total) by mouth daily. 90 tablet 1  . metoprolol succinate (TOPROL-XL) 50 MG 24 hr tablet Take 1 tablet (50 mg total) by mouth 2 (two) times daily. Take with or immediately following a meal. 60 tablet 0  . nitroGLYCERIN (NITROSTAT) 0.4 MG SL tablet Place 1 tablet (0.4 mg total) under the tongue every 5 (five) minutes x 3 doses as needed for chest pain. 25 tablet 3  . potassium chloride SA (K-DUR,KLOR-CON) 20 MEQ tablet Take 0.5 tablets daily by mouth.  0   . predniSONE (DELTASONE) 20 MG tablet Take 2 tablets (40 mg total) by mouth daily with breakfast for 5 days. 10 tablet 0  . spironolactone (ALDACTONE) 25 MG tablet Take 1 tablet (25 mg total) by mouth daily.    . traMADol (ULTRAM) 50 MG tablet Take 1 tablet (50 mg total) by mouth every 8 (eight) hours as needed. 30 tablet 1  . valACYclovir (VALTREX) 1000 MG tablet Take 2 tablets (2,000 mg total) by mouth 2 (two) times daily. For one day, as needed for cold sores. Start at first sign of outbreak. 12 tablet 1  . warfarin (COUMADIN) 4 MG tablet Take 2-4 mg by mouth daily. Take 4 mg every day except on Sunday and Wednesday take 2 mg.     No current facility-administered medications for this visit.    Allergies  Allergen Reactions  . Ramipril     cough       Objective:  BP 124/75   Pulse 91   Temp 98.3 F (36.8 C) (Oral)   Wt (!) 312 lb (141.5 kg)   SpO2 92%   BMI 43.52 kg/m  Gen:  alert, not ill-appearing, no distress, appropriate for age, obese male HEENT: head normocephalic without obvious abnormality, conjunctiva and cornea clear, TM's clear bilaterally, nasal mucosa pink, orpharynx clear, moist mucous membranes, neck supple, no cervical adenopathy, trachea midline Pulm: Normal work of breathing, normal phonation, breath sounds diminished on expiration CV: Normal rate, regular rhythm, s1 and s2 distinct, no murmurs, clicks or rubs  Neuro: alert and oriented x 3, no tremor MSK: extremities atraumatic, normal gait and station Skin: intact, no rashes on exposed skin, no jaundice, normal capillary refill     No results found for this or any previous visit (from the past 72 hour(s)). No results found.    Assessment and Plan: 62 y.o. male with   1. COPD with acute exacerbation ([name]) - vitals reviewed and stable, relative hypoxia 92% on RA at rest. Solumedrol and DuoNeb given in office. Increase home nebs to Q4H prn. Doxycycline twice a day for 7 days. Recheck INR in 1 week -  doxycycline (VIBRA-TABS) 100 MG tablet; Take 1 tablet (100 mg total) by mouth 2 (two) times daily for 7 days.  Dispense: 14 tablet; Refill: 0 - predniSONE (DELTASONE) 20 MG tablet; Take 2 tablets (40 mg total) by mouth daily with breakfast for 5 days.  Dispense: 10 tablet; Refill: 0 - methylPREDNISolone sodium succinate (SOLU-MEDROL) 125 mg/2 mL injection 125 mg - ipratropium-albuterol (DUONEB) 0.5-2.5 (3) MG/3ML nebulizer solution 3 mL  2. Hypoxia  Patient education and  anticipatory guidance given Patient agrees with treatment plan Follow-up as needed if symptoms worsen or fail to improve  Darlyne Russian PA-C

## 2018-02-20 ENCOUNTER — Ambulatory Visit (INDEPENDENT_AMBULATORY_CARE_PROVIDER_SITE_OTHER): Payer: BLUE CROSS/BLUE SHIELD

## 2018-02-20 ENCOUNTER — Encounter: Payer: Self-pay | Admitting: Osteopathic Medicine

## 2018-02-20 ENCOUNTER — Ambulatory Visit: Payer: BLUE CROSS/BLUE SHIELD | Admitting: Osteopathic Medicine

## 2018-02-20 VITALS — BP 123/86 | HR 83 | Temp 98.0°F | Wt 314.0 lb

## 2018-02-20 DIAGNOSIS — J40 Bronchitis, not specified as acute or chronic: Secondary | ICD-10-CM | POA: Diagnosis not present

## 2018-02-20 DIAGNOSIS — J449 Chronic obstructive pulmonary disease, unspecified: Secondary | ICD-10-CM | POA: Diagnosis not present

## 2018-02-20 DIAGNOSIS — I482 Chronic atrial fibrillation, unspecified: Secondary | ICD-10-CM

## 2018-02-20 DIAGNOSIS — R05 Cough: Secondary | ICD-10-CM

## 2018-02-20 DIAGNOSIS — J209 Acute bronchitis, unspecified: Secondary | ICD-10-CM | POA: Diagnosis not present

## 2018-02-20 DIAGNOSIS — R059 Cough, unspecified: Secondary | ICD-10-CM

## 2018-02-20 LAB — POCT INR: INR: 2.4

## 2018-02-20 MED ORDER — IPRATROPIUM-ALBUTEROL 0.5-2.5 (3) MG/3ML IN SOLN: 3.0000 mL | Freq: Four times a day (QID) | RESPIRATORY_TRACT | Status: DC

## 2018-02-20 MED ORDER — GUAIFENESIN ER 600 MG PO TB12: 1200.0000 mg | ORAL_TABLET | Freq: Two times a day (BID) | ORAL | 1 refills | Status: DC

## 2018-02-20 NOTE — Patient Instructions (Signed)
Plan:  Let's have you back in one month to get lung function testing and a walking test to determine possible need for home oxygen, or portable oxygen when walking   If you are ever exposed to someone with known flu illness, let us know right away. We can start medications that will help prevent you from getting the flu yourself!

## 2018-02-20 NOTE — Progress Notes (Signed)
HPI: Jeffrey Larson is a 62 y.o. male who  has a past medical history of Afib ([place]), Coronary artery disease, History of pneumonia, Hyperlipidemia, Myocardial infarction ([place]), and Renal disorder.  he presents to [facility] today, 02/20/18,  for chief complaint of:  Follow-up COPD exacerbation   Saw a colleague last week for COPD exacerbation w/ SaO2 92% on RA. Solumedrol and DuoNeb given in office. Rx home prednisone burst, doxycycline. Advised RTC for recheck breathing and INR check given recent abx use.   Feeling better but still come chest congestion and coughing. SOB on exertion is chronic. No fever/chills. Wife is getting over the flu but he has not had similar symptoms.   Reports had pneumovax at pharmacy few years ago, no records here.    He requests excuse for jury duty today as well. See letter.     Past medical history, surgical history, social history and family history reviewed. No updates needed.   Current medication list and allergy/intolerance information reviewed.    Current Outpatient Medications on File Prior to Visit  Medication Sig Dispense Refill  . albuterol (PROVENTIL) (5 MG/ML) 0.5% nebulizer solution Take 0.5 mLs (2.5 mg total) by nebulization every 6 (six) hours as needed for wheezing or shortness of breath. 20 mL 3  . Albuterol Sulfate (PROAIR RESPICLICK) 323 (90 Base) MCG/ACT AEPB Inhale 1 puff into the lungs every 6 (six) hours as needed (sob). 1 each 1  . AMBULATORY NON FORMULARY MEDICATION Medication Name: Glucometer, lancets and strip to test every other day. Dx: Diabetes type 2, E11.9. 100 strip and 100 lancets 1 Units PRN  . aspirin 81 MG tablet Take 81 mg by mouth daily.    Marland Kitchen atorvastatin (LIPITOR) 40 MG tablet Take 40 mg as directed by mouth.  2  . cyclobenzaprine (FLEXERIL) 10 MG tablet Take 0.5-1 tablets (5-10 mg total) 3 (three) times daily as needed by mouth. 30 tablet 1  . Fluticasone-Umeclidin-Vilant  (TRELEGY ELLIPTA) 100-62.5-25 MCG/INH AEPB Inhale 1 puff into the lungs daily. 1 each 5  . furosemide (LASIX) 40 MG tablet Take 2 tablets by mouth 2 (two) times daily.  0  . ipratropium-albuterol (DUONEB) 0.5-2.5 (3) MG/3ML SOLN Take 3 mLs by nebulization every 2 (two) hours as needed (wheeze, SOB). 60 mL 3  . losartan (COZAAR) 50 MG tablet Take 1 tablet (50 mg total) by mouth daily. 90 tablet 1  . metoprolol succinate (TOPROL-XL) 50 MG 24 hr tablet Take 1 tablet (50 mg total) by mouth 2 (two) times daily. Take with or immediately following a meal. 60 tablet 0  . potassium chloride SA (K-DUR,KLOR-CON) 20 MEQ tablet Take 0.5 tablets daily by mouth.  0  . spironolactone (ALDACTONE) 25 MG tablet Take 1 tablet (25 mg total) by mouth daily.    . traMADol (ULTRAM) 50 MG tablet Take 1 tablet (50 mg total) by mouth every 8 (eight) hours as needed. 30 tablet 1  . valACYclovir (VALTREX) 1000 MG tablet Take 2 tablets (2,000 mg total) by mouth 2 (two) times daily. For one day, as needed for cold sores. Start at first sign of outbreak. 12 tablet 1  . warfarin (COUMADIN) 4 MG tablet Take 2-4 mg by mouth daily. Take 4 mg every day except on Sunday and Wednesday take 2 mg.    . doxycycline (VIBRA-TABS) 100 MG tablet Take 1 tablet (100 mg total) by mouth 2 (two) times daily for 7 days. (Patient not taking: Reported on 02/20/2018) 14 tablet 0  .  nitroGLYCERIN (NITROSTAT) 0.4 MG SL tablet Place 1 tablet (0.4 mg total) under the tongue every 5 (five) minutes x 3 doses as needed for chest pain. 25 tablet 3  . [DISCONTINUED] amiodarone (PACERONE) 200 MG tablet Take 1 tablet (200 mg total) by mouth 2 (two) times daily. 60 tablet 3  . [DISCONTINUED] ramipril (ALTACE) 2.5 MG capsule Take 2.5 mg by mouth daily.      No current facility-administered medications on file prior to visit.    Allergies  Allergen Reactions  . Ramipril     cough      Review of Systems:  Constitutional: No recent illness  HEENT: No   headache, no vision change  Cardiac: No  chest pain, No  pressure, No palpitations  Respiratory:  No  shortness of breath. No  Cough  Gastrointestinal: No  abdominal pain, no change on bowel habits  Musculoskeletal: No new myalgia/arthralgia  Skin: No  Rash  Hem/Onc: No  easy bruising/bleeding, No  abnormal lumps/bumps  Neurologic: No  weakness, No  Dizziness  Psychiatric: No  concerns with depression, No  concerns with anxiety  Exam:  BP 123/86 (BP Location: Left Arm, Patient Position: Sitting, Cuff Size: Large)   Pulse 83   Temp 98 F (36.7 C) (Oral)   Wt (!) 314 lb (142.4 kg)   SpO2 96%   BMI 43.79 kg/m   Constitutional: VS see above. General Appearance: alert, well-developed, well-nourished, NAD  Eyes: Normal lids and conjunctive, non-icteric sclera  Ears, Nose, Mouth, Throat: MMM, Normal external inspection ears/nares/mouth/lips/gums.  Neck: No masses, trachea midline.   Respiratory: Normal respiratory effort. no wheeze, no rhonchi, no rales  Cardiovascular: S1/S2 normal, no murmur, no rub/gallop auscultated. RRR.   Musculoskeletal: Gait normal. Symmetric and independent movement of all extremities  Neurological: Normal balance/coordination. No tremor.  Skin: warm, dry, intact.   Psychiatric: Normal judgment/insight. Normal mood and affect. Oriented x3.    Results for orders placed or performed in visit on 02/20/18 (from the past 24 hour(s))  POCT INR     Status: None   Collection Time: 02/20/18  8:13 AM  Result Value Ref Range   INR 2.4     Immunization History  Administered Date(s) Administered  . Influenza,inj,Quad PF,6+ Mos 11/08/2016, 08/26/2017  . Tdap 07/20/2015    Dg Chest 2 View  Result Date: 02/20/2018 CLINICAL DATA:  COPD.  Bronchitis. EXAM: CHEST - 2 VIEW COMPARISON:  10/24/2017.  04/02/2017.  11/12/2016. FINDINGS: Stable cardiomegaly. Mild bilateral interstitial prominence most likely related chronic interstitial lung disease. No  pleural effusion or pneumothorax. Degenerative changes scoliosis thoracic spine. IMPRESSION: One stable cardiomegaly. 2. Stable chronic interstitial and chronic interstitial lung disease. No acute pulmonary abnormality identified. Electronically Signed   By: Marcello Moores  Register   On: 02/20/2018 09:17      ASSESSMENT/PLAN:   COPD with chronic bronchitis ([place]) - pt reports improvement from last week. Will plan for O2 walk test next visit  - Plan: DG Chest 2 View, ipratropium-albuterol (DUONEB) 0.5-2.5 (3) MG/3ML nebulizer solution 3 mL  Chronic atrial fibrillation ([facility]) - Plan: POCT INR  Cough - Plan: DG Chest 2 View   No orders of the defined types were placed in this encounter.   Patient Instructions  Plan:  Let's have you back in one month to get lung function testing and a walking test to determine possible need for home oxygen, or portable oxygen when walking   If you are ever exposed to someone with known flu illness, let  us know right away. We can start medications that will help prevent you from getting the flu yourself!   Pt states he has had pneumonia shot at his pharmacy - we will call and see if we can confirm records.    Follow-up plan: Return in about 2 weeks (around 03/06/2018) for walking O2 test and then spirometry w/ nurse, discuss results w/ Dr Sheppard Coil after .  Visit summary with medication list and pertinent instructions was printed for patient to review, alert [place] if any changes needed. All questions at time of visit were answered - patient instructed to contact office with any additional concerns. ER/RTC precautions were reviewed with the patient and understanding verbalized.     Please note: voice recognition software was used to produce this document, and typos may escape review. Please contact Dr. Sheppard Coil for any needed clarifications.

## 2018-02-26 ENCOUNTER — Telehealth: Payer: Self-pay | Admitting: Osteopathic Medicine

## 2018-02-26 MED ORDER — PREDNISONE 10 MG (48) PO TBPK: ORAL_TABLET | Freq: Every day | ORAL | 0 refills | Status: DC

## 2018-02-26 MED ORDER — CEFDINIR 300 MG PO CAPS: 300.0000 mg | ORAL_CAPSULE | Freq: Two times a day (BID) | ORAL | 0 refills | Status: DC

## 2018-02-26 NOTE — Telephone Encounter (Signed)
Pt advised of Rx's and Provider recommendation. appt scheduled for tomorrow with Georgina Snell for evaluation. No further questions.

## 2018-02-26 NOTE — Telephone Encounter (Signed)
Routing for review. Per note, he shouldn't come back until next week. Has been seen in office multiple times for same symptoms recently.

## 2018-02-26 NOTE — Telephone Encounter (Signed)
Start medicine return to clinic this week with me or Kuna.  Needs additionally heart workup to rule out Afib or CHF exacerbation mimicking COPD Go to ED if worsening overnight.

## 2018-02-26 NOTE — Telephone Encounter (Signed)
Patient was seen by Dr. Sheppard Coil last week and was told to call our office if he was not feeling better by this week. Patient stated that he continues to be coughing and feels that he is still having rattling in his chest. He has been using OTC Mucinex, but it does not seem to be helping. He is requesting a prescription. Please advise.

## 2018-02-27 ENCOUNTER — Ambulatory Visit (INDEPENDENT_AMBULATORY_CARE_PROVIDER_SITE_OTHER): Payer: BLUE CROSS/BLUE SHIELD

## 2018-02-27 ENCOUNTER — Encounter: Payer: Self-pay | Admitting: Family Medicine

## 2018-02-27 ENCOUNTER — Ambulatory Visit: Payer: BLUE CROSS/BLUE SHIELD | Admitting: Family Medicine

## 2018-02-27 VITALS — BP 111/86 | HR 96 | Temp 98.4°F | Ht 71.0 in | Wt 305.0 lb

## 2018-02-27 DIAGNOSIS — I482 Chronic atrial fibrillation, unspecified: Secondary | ICD-10-CM

## 2018-02-27 DIAGNOSIS — I5043 Acute on chronic combined systolic (congestive) and diastolic (congestive) heart failure: Secondary | ICD-10-CM | POA: Diagnosis not present

## 2018-02-27 DIAGNOSIS — I517 Cardiomegaly: Secondary | ICD-10-CM | POA: Diagnosis not present

## 2018-02-27 DIAGNOSIS — R05 Cough: Secondary | ICD-10-CM | POA: Diagnosis not present

## 2018-02-27 DIAGNOSIS — R0602 Shortness of breath: Secondary | ICD-10-CM

## 2018-02-27 DIAGNOSIS — I342 Nonrheumatic mitral (valve) stenosis: Secondary | ICD-10-CM

## 2018-02-27 DIAGNOSIS — J449 Chronic obstructive pulmonary disease, unspecified: Secondary | ICD-10-CM | POA: Diagnosis not present

## 2018-02-27 MED ORDER — METOPROLOL SUCCINATE ER 100 MG PO TB24: 100.0000 mg | ORAL_TABLET | Freq: Two times a day (BID) | ORAL | 1 refills | Status: DC

## 2018-02-27 NOTE — Patient Instructions (Signed)
Thank you for coming in today. Get labs now Continue prednisone and omnicef.  Increase metoprolol to 2 pills daily.  Continue lasix 3-4 x daily.  Recheck tomorrow with charley at 4pm .

## 2018-02-27 NOTE — Progress Notes (Signed)
Jeffrey Larson is a 62 y.o. male who presents to [facility] today for shortness of breath and wheezing.Marland Kitchen  He has a history of cardiac disease including heart failure symptoms, atrial fibrillation, and mitral valve stenosis.  His rate is controlled with metoprolol.  Additionally he has a history COPD as well.  He has been seen several times by his primary care provider (Dr. Sheppard Coil) and Gustavus Messing recently for presumed COPD exacerbation.  He notes that he is not feeling much better at all and notes continued wheezing and shortness of breath especially with exertion.  When asked that he notes that he did have some leg swelling last week but increased his Lasix and notes that the leg swelling improved.  He does note orthopnea.  He denies chest pain or palpitations.  He notes he is using his nebulizer every 6 hours or so which does help.  He mistakenly stopped taking his Trelegy inhaler because he thought he could not take it when he was using his albuterol nebulizer.  He feels short of breath now but does not think he is quite in need of hospitalization at this point.   Past Medical History:  Diagnosis Date  . Afib ([place])   . COPD with chronic bronchitis ([place]) 09/08/2014  . Coronary artery disease   . History of pneumonia    RML on CXR 07/20/14  . Hyperlipidemia   . Myocardial infarction ([place])   . Renal disorder    history kidney stone   Past Surgical History:  Procedure Laterality Date  . APPENDECTOMY    . CARDIOVASCULAR STRESS TEST  03/2014   Borderline reversible ischemic changes at the apex.  Normal LV contractility/EF 52%.  . CORONARY STENT PLACEMENT  7/12013  . LEFT HEART CATHETERIZATION WITH CORONARY ANGIOGRAM N/A 06/12/2012   Procedure: LEFT HEART CATHETERIZATION WITH CORONARY ANGIOGRAM;  Surgeon: Clent Demark, MD;  Location: [facility] CATH LAB;  Service:  Cardiovascular;  Laterality: N/A;  . PERCUTANEOUS CORONARY STENT INTERVENTION (PCI-S) N/A 06/17/2012   Procedure: PERCUTANEOUS CORONARY STENT INTERVENTION (PCI-S);  Surgeon: Clent Demark, MD;  Location: [facility] CATH LAB;  Service: Cardiovascular;  Laterality: N/A;   Social History   Tobacco Use  . Smoking status: Former Smoker    Years: 0.00    Types: Cigarettes    Last attempt to quit: 11/26/1996    Years since quitting: 21.2  . Smokeless tobacco: Never Used  Substance Use Topics  . Alcohol use: No   family history includes Heart disease in his father and mother.  ROS as above: No headache, visual changes, nausea, vomiting, diarrhea, constipation, dizziness, abdominal pain, skin rash, fevers, chills,  weight loss, swollen lymph nodes, body aches, joint swelling, muscle aches, chest pain, , mood changes, visual or auditory hallucinations.   positive for night sweats last night and shortness of breath  Medications: Current Outpatient Medications  Medication Sig Dispense Refill  . albuterol (PROVENTIL) (5 MG/ML) 0.5% nebulizer solution Take 0.5 mLs (2.5 mg total) by nebulization every 6 (six) hours as needed for wheezing or shortness of breath. 20 mL 3  . Albuterol Sulfate (PROAIR RESPICLICK) 211 (90 Base) MCG/ACT AEPB Inhale 1 puff into the lungs every 6 (six) hours as needed (sob). 1 each 1  . AMBULATORY NON FORMULARY MEDICATION Medication Name: Glucometer, lancets and strip to test every other day. Dx: Diabetes type 2, E11.9. 100 strip and 100 lancets 1 Units PRN  . aspirin 81  MG tablet Take 81 mg by mouth daily.    Marland Kitchen atorvastatin (LIPITOR) 40 MG tablet Take 40 mg as directed by mouth.  2  . cefdinir (OMNICEF) 300 MG capsule Take 1 capsule (300 mg total) by mouth 2 (two) times daily. 14 capsule 0  . cyclobenzaprine (FLEXERIL) 10 MG tablet Take 0.5-1 tablets (5-10 mg total) 3 (three) times daily as needed by mouth. 30 tablet 1  . Fluticasone-Umeclidin-Vilant (TRELEGY ELLIPTA) 100-62.5-25  MCG/INH AEPB Inhale 1 puff into the lungs daily. 1 each 5  . furosemide (LASIX) 40 MG tablet Take 2 tablets by mouth 2 (two) times daily.  0  . guaiFENesin (MUCINEX) 600 MG 12 hr tablet Take 2 tablets (1,200 mg total) by mouth 2 (two) times daily. As needed for cough 30 tablet 1  . ipratropium-albuterol (DUONEB) 0.5-2.5 (3) MG/3ML SOLN Take 3 mLs by nebulization every 2 (two) hours as needed (wheeze, SOB). 60 mL 3  . losartan (COZAAR) 50 MG tablet Take 1 tablet (50 mg total) by mouth daily. 90 tablet 1  . metoprolol succinate (TOPROL-XL) 100 MG 24 hr tablet Take 1 tablet (100 mg total) by mouth 2 (two) times daily. Take with or immediately following a meal. 60 tablet 1  . potassium chloride SA (K-DUR,KLOR-CON) 20 MEQ tablet Take 0.5 tablets daily by mouth.  0  . predniSONE (STERAPRED UNI-PAK 48 TAB) 10 MG (48) TBPK tablet Take by mouth daily. 12 day dosepack po 48 tablet 0  . spironolactone (ALDACTONE) 25 MG tablet Take 1 tablet (25 mg total) by mouth daily.    . traMADol (ULTRAM) 50 MG tablet Take 1 tablet (50 mg total) by mouth every 8 (eight) hours as needed. 30 tablet 1  . valACYclovir (VALTREX) 1000 MG tablet Take 2 tablets (2,000 mg total) by mouth 2 (two) times daily. For one day, as needed for cold sores. Start at first sign of outbreak. 12 tablet 1  . warfarin (COUMADIN) 4 MG tablet Take 2-4 mg by mouth daily. Take 4 mg every day except on Sunday and Wednesday take 2 mg.    . nitroGLYCERIN (NITROSTAT) 0.4 MG SL tablet Place 1 tablet (0.4 mg total) under the tongue every 5 (five) minutes x 3 doses as needed for chest pain. 25 tablet 3   Current Facility-Administered Medications  Medication Dose Route Frequency Provider Last Rate Last Dose  . ipratropium-albuterol (DUONEB) 0.5-2.5 (3) MG/3ML nebulizer solution 3 mL  3 mL Nebulization Q6H [name], DO       Allergies  Allergen Reactions  . Ramipril     cough    Health Maintenance Health Maintenance  Topic Date Due  .  Hepatitis C Screening  26-Oct-1956  . PNEUMOCOCCAL POLYSACCHARIDE VACCINE (1) 09/10/1958  . OPHTHALMOLOGY EXAM  09/10/1966  . HIV Screening  09/11/1971  . HEMOGLOBIN A1C  05/28/2018  . INFLUENZA VACCINE  06/26/2018  . FOOT EXAM  11/28/2018  . Fecal DNA (Cologuard)  03/04/2020  . TETANUS/TDAP  07/19/2025     Exam:  BP 111/86   Pulse 96   Temp 98.4 F (36.9 C) (Oral)   Ht 5\' 11"  (1.803 m)   Wt (!) 305 lb (138.3 kg)   SpO2 95%   BMI 42.54 kg/m   Wt Readings from Last 5 Encounters:  02/27/18 (!) 305 lb (138.3 kg)  02/20/18 (!) 314 lb (142.4 kg)  02/13/18 (!) 312 lb (141.5 kg)  11/28/17 (!) 312 lb 0.6 oz (141.5 kg)  10/24/17 (!) 303 lb 3.2 oz (137.5  kg)    Gen: Non-toxic appearing HEENT: EOMI,  MMM Lungs: Increased work of breathing with wheezing and prolonged expiratory phase bilaterally  Heart: RRR no MRG Abd: NABS, Soft. Nondistended, Nontender Exts: Brisk capillary refill, warm and well perfused.  1+ edema bilateral lower extremities  2 view chest x-ray images today show cardiomegaly.  Not significantly changed from prior chest x-ray.  Awaiting formal radiology review.  Twelve-lead EKG shows atrial fibrillation with a rate of around 120 bpm.  No acute ST segment elevation or depression.  Waveform not significantly changed from baseline.  Echocardiogram from December 2018 showing severe mitral valve stenosis reviewed.    Assessment and Plan: 62 y.o. male with  Shortness of breath and cough.  Likely multifactorial.  Alvester Chou is right on the cost of hospitalization.  However he has good insight to how he is feeling has excellent access to healthcare.  I think it is reasonable to attempt outpatient management.  I have arranged for for follow-up appointment tomorrow with my partner Gustavus Messing at 4pm.   I believe the shortness of breath is likely due to a combination of heart failure due to atrial fib rate control as well as mitral valve stenosis.  Additionally I do believe  he has a COPD exacerbation.  1) COPD: Tinea prednisone course prescribed yesterday.  Restart Trelegy.  Continue albuterol.  Check tomorrow.  Present to ED if needed.  2) heart failure: Patient is about 10 pounds down from last week.  Plan to check basic labs and BNP today.  Encourage weight-based Lasix dosing.  Recheck tomorrow  3) atrial fibrillation: Poor rate control today.  Plan to increase metoprolol to 100 mg twice daily from 50 mg twice daily.  I am hopeful that with better rate control he will have better diastolic filling and then better cardiac output.  Recommend prompt follow-up with cardiology in the near future.  4) mitral valve stenosis: Chronic issue contributing to today's symptoms.  I wonder if he meets criteria for surgery. Recommend follow up with PCP and cardiology   Orders Placed This Encounter  Procedures  . DG Chest 2 View    Order Specific Question:   Reason for exam:    Answer:   SOB and COUGH COPD vs CHF exacerb vs PNA    Order Specific Question:   Preferred imaging location?    Answer:   [facility]  . CBC  . COMPLETE METABOLIC PANEL WITH GFR  . B Nat Peptide   Meds ordered this encounter  Medications  . metoprolol succinate (TOPROL-XL) 100 MG 24 hr tablet    Sig: Take 1 tablet (100 mg total) by mouth 2 (two) times daily. Take with or immediately following a meal.    Dispense:  60 tablet    Refill:  1     Discussed warning signs or symptoms. Please see discharge instructions. Patient expresses understanding.

## 2018-02-28 ENCOUNTER — Ambulatory Visit (INDEPENDENT_AMBULATORY_CARE_PROVIDER_SITE_OTHER): Payer: BLUE CROSS/BLUE SHIELD | Admitting: Physician Assistant

## 2018-02-28 ENCOUNTER — Encounter: Payer: Self-pay | Admitting: Physician Assistant

## 2018-02-28 VITALS — BP 133/88 | HR 95 | Wt 307.0 lb

## 2018-02-28 DIAGNOSIS — R609 Edema, unspecified: Secondary | ICD-10-CM

## 2018-02-28 DIAGNOSIS — R0602 Shortness of breath: Secondary | ICD-10-CM | POA: Diagnosis not present

## 2018-02-28 DIAGNOSIS — R059 Cough, unspecified: Secondary | ICD-10-CM

## 2018-02-28 DIAGNOSIS — I5043 Acute on chronic combined systolic (congestive) and diastolic (congestive) heart failure: Secondary | ICD-10-CM

## 2018-02-28 DIAGNOSIS — I482 Chronic atrial fibrillation, unspecified: Secondary | ICD-10-CM

## 2018-02-28 DIAGNOSIS — R0609 Other forms of dyspnea: Secondary | ICD-10-CM

## 2018-02-28 DIAGNOSIS — R05 Cough: Secondary | ICD-10-CM

## 2018-02-28 MED ORDER — TORSEMIDE 10 MG PO TABS: 10.0000 mg | ORAL_TABLET | Freq: Every day | ORAL | 0 refills | Status: DC

## 2018-02-28 MED ORDER — BENZONATATE 200 MG PO CAPS: 200.0000 mg | ORAL_CAPSULE | Freq: Three times a day (TID) | ORAL | 0 refills | Status: DC | PRN

## 2018-02-28 MED ORDER — HYDROCOD POLST-CPM POLST ER 10-8 MG/5ML PO SUER: 5.0000 mL | Freq: Two times a day (BID) | ORAL | 0 refills | Status: AC | PRN

## 2018-02-28 NOTE — Patient Instructions (Addendum)
-   Monitor your weight closely. Weigh yourself tonight on your own scale when you get home so you have an accurate baseline - start with Torsemide 10 mg once daily - increase to 10 mg twice a day if weight is increasing - CAUTION: this is potent diuretic so do not double the dosage or combine with Lasix  - Continue everything else that Dr. Georgina Snell had you do! - Metoprolol 100 mg daily - Trelegy inhaler daily - Nebulizer treatments every 4 hours as needed - Prednisone taper - Antibiotic

## 2018-02-28 NOTE — Progress Notes (Signed)
HPI:                                                                Jeffrey Larson is a 62 y.o. male who presents to [facility] [place] today for follow-up shortness of breath  Pleasant 62 yo M with COPD, CAD, Afib, severe mitral stenosis, hx of PNA who has been seen at this practice for 3 times in the last 2 weeks for persistent wheezing and SOB. He had been treated with parenteral and oral steroids and antibiotics, without significant improvement. He was found to be in CHF exacerbation and tacycardic by Dr. Georgina Snell at most recent visit on 02/27/2018. He was also found to not be taking his controller medication due to some confusion. Dr. Georgina Snell increased his Lasix, increased his Metoprolol for rate control, resumed his Trelegy and recommended close follow-up with cardiology. He also ordered BNP, CMP and CBC, but patient did not have his labs drawn. Chest x-ray showed stable vascular congestion without pulmonary edema, effusion or consolidation.  He is feeling improved today. He states he feels best first thing in the morning and his breathing becomes worse with activity throughout the day. Home SpO2 96% most of hte time with occasional desaturations to 88%.  Currently taking Duoneb every 4 hours, prednisone taper, Lasix 20 mg three times daily  Depression screen [facility] 2/9 08/02/2017  Decreased Interest 0  Down, Depressed, Hopeless 0  PHQ - 2 Score 0    No flowsheet data found.    Past Medical History:  Diagnosis Date  . Afib ([place])   . COPD with chronic bronchitis ([place]) 09/08/2014  . Coronary artery disease   . History of pneumonia    RML on CXR 07/20/14  . Hyperlipidemia   . Myocardial infarction ([place])   . Renal disorder    history kidney stone   Past Surgical History:  Procedure Laterality Date  . APPENDECTOMY    . CARDIOVASCULAR STRESS TEST  03/2014   Borderline reversible ischemic changes at the apex.  Normal LV contractility/EF 52%.  .  CORONARY STENT PLACEMENT  7/12013  . LEFT HEART CATHETERIZATION WITH CORONARY ANGIOGRAM N/A 06/12/2012   Procedure: LEFT HEART CATHETERIZATION WITH CORONARY ANGIOGRAM;  Surgeon: Clent Demark, MD;  Location: [facility] CATH LAB;  Service: Cardiovascular;  Laterality: N/A;  . PERCUTANEOUS CORONARY STENT INTERVENTION (PCI-S) N/A 06/17/2012   Procedure: PERCUTANEOUS CORONARY STENT INTERVENTION (PCI-S);  Surgeon: Clent Demark, MD;  Location: [facility] CATH LAB;  Service: Cardiovascular;  Laterality: N/A;   Social History   Tobacco Use  . Smoking status: Former Smoker    Years: 0.00    Types: Cigarettes    Last attempt to quit: 11/26/1996    Years since quitting: 21.2  . Smokeless tobacco: Never Used  Substance Use Topics  . Alcohol use: No   family history includes Heart disease in his father and mother.    ROS: negative except as noted in the HPI  Medications: Current Outpatient Medications  Medication Sig Dispense Refill  . albuterol (PROVENTIL) (5 MG/ML) 0.5% nebulizer solution Take 0.5 mLs (2.5 mg total) by nebulization every 6 (six) hours as needed for wheezing or shortness of breath. 20 mL 3  . Albuterol Sulfate (PROAIR RESPICLICK) 185 (90 Base)  MCG/ACT AEPB Inhale 1 puff into the lungs every 6 (six) hours as needed (sob). 1 each 1  . AMBULATORY NON FORMULARY MEDICATION Medication Name: Glucometer, lancets and strip to test every other day. Dx: Diabetes type 2, E11.9. 100 strip and 100 lancets 1 Units PRN  . aspirin 81 MG tablet Take 81 mg by mouth daily.    Marland Kitchen atorvastatin (LIPITOR) 40 MG tablet Take 40 mg as directed by mouth.  2  . benzonatate (TESSALON) 200 MG capsule Take 1 capsule (200 mg total) by mouth 3 (three) times daily as needed for cough. 30 capsule 0  . cefdinir (OMNICEF) 300 MG capsule Take 1 capsule (300 mg total) by mouth 2 (two) times daily. 14 capsule 0  . cyclobenzaprine (FLEXERIL) 10 MG tablet Take 0.5-1 tablets (5-10 mg total) 3 (three) times daily as needed by mouth. 30  tablet 1  . Fluticasone-Umeclidin-Vilant (TRELEGY ELLIPTA) 100-62.5-25 MCG/INH AEPB Inhale 1 puff into the lungs daily. 1 each 5  . guaiFENesin (MUCINEX) 600 MG 12 hr tablet Take 2 tablets (1,200 mg total) by mouth 2 (two) times daily. As needed for cough 30 tablet 1  . ipratropium-albuterol (DUONEB) 0.5-2.5 (3) MG/3ML SOLN Take 3 mLs by nebulization every 2 (two) hours as needed (wheeze, SOB). 60 mL 3  . losartan (COZAAR) 50 MG tablet Take 1 tablet (50 mg total) by mouth daily. 90 tablet 1  . metoprolol succinate (TOPROL-XL) 100 MG 24 hr tablet Take 1 tablet (100 mg total) by mouth 2 (two) times daily. Take with or immediately following a meal. 60 tablet 1  . nitroGLYCERIN (NITROSTAT) 0.4 MG SL tablet Place 1 tablet (0.4 mg total) under the tongue every 5 (five) minutes x 3 doses as needed for chest pain. 25 tablet 3  . potassium chloride SA (K-DUR,KLOR-CON) 20 MEQ tablet Take 0.5 tablets daily by mouth.  0  . predniSONE (STERAPRED UNI-PAK 48 TAB) 10 MG (48) TBPK tablet Take by mouth daily. 12 day dosepack po 48 tablet 0  . spironolactone (ALDACTONE) 25 MG tablet Take 1 tablet (25 mg total) by mouth daily.    Marland Kitchen torsemide (DEMADEX) 10 MG tablet Take 1 tablet (10 mg total) by mouth daily. Take additional 10 mg PO if weight is increasing 20 tablet 0  . traMADol (ULTRAM) 50 MG tablet Take 1 tablet (50 mg total) by mouth every 8 (eight) hours as needed. 30 tablet 1  . valACYclovir (VALTREX) 1000 MG tablet Take 2 tablets (2,000 mg total) by mouth 2 (two) times daily. For one day, as needed for cold sores. Start at first sign of outbreak. 12 tablet 1  . warfarin (COUMADIN) 4 MG tablet Take 2-4 mg by mouth daily. Take 4 mg every day except on Sunday and Wednesday take 2 mg.     Current Facility-Administered Medications  Medication Dose Route Frequency Provider Last Rate Last Dose  . ipratropium-albuterol (DUONEB) 0.5-2.5 (3) MG/3ML nebulizer solution 3 mL  3 mL Nebulization Q6H [name], DO        Allergies  Allergen Reactions  . Ramipril     cough       Objective:  BP 133/88   Pulse 95   Wt (!) 307 lb (139.3 kg)   SpO2 94%   BMI 42.82 kg/m  Gen:  alert, not ill-appearing, no distress, appropriate for age, obese male HEENT: head normocephalic without obvious abnormality, conjunctiva and cornea clear, trachea midline Pulm: Normal work of breathing, normal phonation, clear to auscultation bilaterally, no wheezes,  rales or rhonchi CV: Normal rate, regular rhythm, s1 and s2 distinct, no murmurs, clicks or rubs  Neuro: alert and oriented x 3, no tremor MSK: extremities atraumatic, normal gait and station, 1+ b/l peripheral edema Skin: intact, no rashes on exposed skin, no jaundice, no cyanosis Psych: well-groomed, cooperative, good eye contact, euthymic mood, affect mood-congruent, speech is articulate, and thought processes clear and goal-directed   Echocardiogram from December 2018 showing severe mitral valve stenosis reviewed.  CLINICAL DATA:  62 year old male with shortness of breath and productive cough for 4 weeks.  EXAM: CHEST - 2 VIEW  COMPARISON:  02/20/2018 and 10/15/2017  FINDINGS: Stable cardiomegaly. Prominent central vascular structures are unchanged and appear chronic. Trachea is midline. No focal airspace disease or overt pulmonary edema. No large pleural effusions. Stable degenerative changes in the thoracic spine.  IMPRESSION: No active cardiopulmonary disease.  Stable cardiomegaly with chronically enlarged central vascular structures.   Electronically Signed   By: Markus Daft M.D.   On: 02/28/2018 09:18 No results found for this or any previous visit (from the past 72 hour(s)). No results found.    Assessment and Plan: 62 y.o. male with   Acute on chronic CHF and Chronic Afib - rate control is good today. Continue Metoprolol XL 100 mg daily - he is up 1 pound compared to last office visit and still fluid overloaded on exam  and CXR. Switching from Lasix to Torsemide 10 mg QD. Instructed on self-titrating 10 bid based on weights. Wt Readings from Last 3 Encounters:  03/05/18 (!) 308 lb (139.7 kg)  02/28/18 (!) 307 lb (139.3 kg)  02/27/18 (!) 305 lb (138.3 kg)   COPD - SpO2 94% on RA at rest today, no evidence of repsiraotry distress. Reports home SpO2 consistently above 92% with occasional desats on exertion to 88% - continue Trelegy and Nebs Q4H prn - continue Prednisone taper and antibiotic - follow-up with Pulmonology 03/25/18 as scheduled   Patient education and anticipatory guidance given Patient agrees with treatment plan Follow-up as needed if symptoms worsen or fail to improve  Darlyne Russian PA-C

## 2018-03-01 LAB — CBC
HCT: 47.7 % (ref 38.5–50.0)
Hemoglobin: 16.1 g/dL (ref 13.2–17.1)
MCH: 27.8 pg (ref 27.0–33.0)
MCHC: 33.8 g/dL (ref 32.0–36.0)
MCV: 82.4 fL (ref 80.0–100.0)
MPV: 10.4 fL (ref 7.5–12.5)
Platelets: 301 10*3/uL (ref 140–400)
RBC: 5.79 10*6/uL (ref 4.20–5.80)
RDW: 14.3 % (ref 11.0–15.0)
WBC: 14.6 10*3/uL — AB (ref 3.8–10.8)

## 2018-03-01 LAB — COMPLETE METABOLIC PANEL WITH GFR
AG Ratio: 1.5 (calc) (ref 1.0–2.5)
ALBUMIN MSPROF: 4.1 g/dL (ref 3.6–5.1)
ALKALINE PHOSPHATASE (APISO): 66 U/L (ref 40–115)
ALT: 40 U/L (ref 9–46)
AST: 34 U/L (ref 10–35)
BILIRUBIN TOTAL: 0.8 mg/dL (ref 0.2–1.2)
BUN / CREAT RATIO: 26 (calc) — AB (ref 6–22)
BUN: 34 mg/dL — AB (ref 7–25)
CHLORIDE: 100 mmol/L (ref 98–110)
CO2: 24 mmol/L (ref 20–32)
CREATININE: 1.32 mg/dL — AB (ref 0.70–1.25)
Calcium: 9.5 mg/dL (ref 8.6–10.3)
GFR, Est African American: 67 mL/min/{1.73_m2} (ref >=60)
GFR, Est Non African American: 58 mL/min/{1.73_m2} — ABNORMAL LOW (ref >=60)
GLUCOSE: 117 mg/dL — AB (ref 65–99)
Globulin: 2.8 g/dL (calc) (ref 1.9–3.7)
Potassium: 4.8 mmol/L (ref 3.5–5.3)
Sodium: 137 mmol/L (ref 135–146)
Total Protein: 6.9 g/dL (ref 6.1–8.1)

## 2018-03-01 LAB — BRAIN NATRIURETIC PEPTIDE: BRAIN NATRIURETIC PEPTIDE: 457 pg/mL — AB (ref <100)

## 2018-03-05 ENCOUNTER — Other Ambulatory Visit: Payer: BLUE CROSS/BLUE SHIELD

## 2018-03-05 ENCOUNTER — Ambulatory Visit (INDEPENDENT_AMBULATORY_CARE_PROVIDER_SITE_OTHER): Payer: BLUE CROSS/BLUE SHIELD | Admitting: Osteopathic Medicine

## 2018-03-05 ENCOUNTER — Encounter: Payer: Self-pay | Admitting: Osteopathic Medicine

## 2018-03-05 VITALS — BP 121/77 | HR 54 | Temp 98.3°F | Wt 308.0 lb

## 2018-03-05 DIAGNOSIS — R0609 Other forms of dyspnea: Secondary | ICD-10-CM

## 2018-03-05 DIAGNOSIS — J449 Chronic obstructive pulmonary disease, unspecified: Secondary | ICD-10-CM

## 2018-03-05 DIAGNOSIS — I509 Heart failure, unspecified: Secondary | ICD-10-CM | POA: Diagnosis not present

## 2018-03-05 DIAGNOSIS — N179 Acute kidney failure, unspecified: Secondary | ICD-10-CM | POA: Diagnosis not present

## 2018-03-05 NOTE — Progress Notes (Signed)
HPI: Jeffrey Larson is a 62 y.o. male who  has a past medical history of Afib (Jeffrey Larson), COPD with chronic bronchitis (Jeffrey Larson) (09/08/2014), Coronary artery disease, History of pneumonia, Hyperlipidemia, Myocardial infarction Jeffrey Larson), and Renal disorder.  he presents to [facility] today, 03/05/18,  for chief complaint of:  COPD follow-up Bradycardia on intake vitals   COPD: shortness of breath/COPD exacerbations continue to be a problem for Jeffrey Larson, he was treated by a few  Colleagues last week while I was out of the office. He states today he is feeling a whole up better. I wanted him to come back for pulmonary function test, 6 minute walk test but he declines these right now, doesn't think he needs home oxygen. Would like to wait until he sees pulmonology, this is fine with me.  History of CHF: Patient states he has been weighing himself daily, has not gained any weight. Dr. Georgina Larson increased the Lasix a bit he was feeling short of breath last week, patient is back to taking 1 in the morning and 1 in the evening and doing well on this.  Heart rate: history of atrial fibrillation, monitors were having a hard time picking up her rate, on auscultation its irregular but 50s-60s. Patient denies dizziness, fatigue, lightheadedness, chest pain, palpitations.     Past medical history, surgical history, social history and family history reviewed. No updates needed.   Current medication list and allergy/intolerance information reviewed.    Current Outpatient Medications on File Prior to Visit  Medication Sig Dispense Refill  . albuterol (PROVENTIL) (5 MG/ML) 0.5% nebulizer solution Take 0.5 mLs (2.5 mg total) by nebulization every 6 (six) hours as needed for wheezing or shortness of breath. 20 mL 3  . Albuterol Sulfate (PROAIR RESPICLICK) 202 (90 Base) MCG/ACT AEPB Inhale 1 puff into the lungs every 6 (six) hours as needed (sob). 1 each 1  . AMBULATORY NON FORMULARY  MEDICATION Medication Name: Glucometer, lancets and strip to test every other day. Dx: Diabetes type 2, E11.9. 100 strip and 100 lancets 1 Units PRN  . aspirin 81 MG tablet Take 81 mg by mouth daily.    Marland Kitchen atorvastatin (LIPITOR) 40 MG tablet Take 40 mg as directed by mouth.  2  . benzonatate (TESSALON) 200 MG capsule Take 1 capsule (200 mg total) by mouth 3 (three) times daily as needed for cough. 30 capsule 0  . cefdinir (OMNICEF) 300 MG capsule Take 1 capsule (300 mg total) by mouth 2 (two) times daily. 14 capsule 0  . chlorpheniramine-HYDROcodone (TUSSIONEX) 10-8 MG/5ML SUER Take 5 mLs by mouth every 12 (twelve) hours as needed for up to 5 days for cough. 50 mL 0  . cyclobenzaprine (FLEXERIL) 10 MG tablet Take 0.5-1 tablets (5-10 mg total) 3 (three) times daily as needed by mouth. 30 tablet 1  . Fluticasone-Umeclidin-Vilant (TRELEGY ELLIPTA) 100-62.5-25 MCG/INH AEPB Inhale 1 puff into the lungs daily. 1 each 5  . guaiFENesin (MUCINEX) 600 MG 12 hr tablet Take 2 tablets (1,200 mg total) by mouth 2 (two) times daily. As needed for cough 30 tablet 1  . ipratropium-albuterol (DUONEB) 0.5-2.5 (3) MG/3ML SOLN Take 3 mLs by nebulization every 2 (two) hours as needed (wheeze, SOB). 60 mL 3  . losartan (COZAAR) 50 MG tablet Take 1 tablet (50 mg total) by mouth daily. 90 tablet 1  . metoprolol succinate (TOPROL-XL) 100 MG 24 hr tablet Take 1 tablet (100 mg total) by mouth 2 (two) times daily. Take with or  immediately following a meal. 60 tablet 1  . potassium chloride SA (K-DUR,KLOR-CON) 20 MEQ tablet Take 0.5 tablets daily by mouth.  0  . predniSONE (STERAPRED UNI-PAK 48 TAB) 10 MG (48) TBPK tablet Take by mouth daily. 12 day dosepack po 48 tablet 0  . spironolactone (ALDACTONE) 25 MG tablet Take 1 tablet (25 mg total) by mouth daily.    Marland Kitchen torsemide (DEMADEX) 10 MG tablet Take 1 tablet (10 mg total) by mouth daily. Take additional 10 mg PO if weight is increasing 20 tablet 0  . traMADol (ULTRAM) 50 MG  tablet Take 1 tablet (50 mg total) by mouth every 8 (eight) hours as needed. 30 tablet 1  . valACYclovir (VALTREX) 1000 MG tablet Take 2 tablets (2,000 mg total) by mouth 2 (two) times daily. For one day, as needed for cold sores. Start at first sign of outbreak. 12 tablet 1  . warfarin (COUMADIN) 4 MG tablet Take 2-4 mg by mouth daily. Take 4 mg every day except on Sunday and Wednesday take 2 mg.    . nitroGLYCERIN (NITROSTAT) 0.4 MG SL tablet Place 1 tablet (0.4 mg total) under the tongue every 5 (five) minutes x 3 doses as needed for chest pain. 25 tablet 3  . [DISCONTINUED] amiodarone (PACERONE) 200 MG tablet Take 1 tablet (200 mg total) by mouth 2 (two) times daily. 60 tablet 3  . [DISCONTINUED] ramipril (ALTACE) 2.5 MG capsule Take 2.5 mg by mouth daily.      Current Facility-Administered Medications on File Prior to Visit  Medication Dose Route Frequency Provider Last Rate Last Dose  . ipratropium-albuterol (DUONEB) 0.5-2.5 (3) MG/3ML nebulizer solution 3 mL  3 mL Nebulization Q6H [name], DO       Allergies  Allergen Reactions  . Ramipril     cough      Review of Systems:  Constitutional: No recent illness  HEENT: No  headache, no vision change  Cardiac: No  chest pain, No  pressure, No palpitations  Respiratory:  +shortness of breath. +Cough  Gastrointestinal: No  abdominal pain, no change on bowel habits  Musculoskeletal: No new myalgia/arthralgia  Neurologic: No  weakness, No  Dizziness  Exam:  BP 121/77 (BP Location: Left Arm, Patient Position: Sitting, Cuff Size: Large)   Pulse (!) 54   Temp 98.3 F (36.8 C) (Oral)   Wt (!) 308 lb (139.7 kg)   BMI 42.96 kg/m   Constitutional: VS see above. General Appearance: alert, well-developed, well-nourished, NAD  Eyes: Normal lids and conjunctive, non-icteric sclera  Ears, Nose, Mouth, Throat: MMM, Normal external inspection ears/nares/mouth/lips/gums.  Neck: No masses, trachea midline.   Respiratory:  Normal respiratory effort. +diffuse in all lung fieldswheeze, no rhonchi, no rales  Cardiovascular: S1/S2 normal, no murmur, no rub/gallop auscultated. rregularly irregular rhythm.   Musculoskeletal: Gait normal. Symmetric and independent movement of all extremities  Neurological: Normal balance/coordination. No tremor.  Skin: warm, dry, intact.   Psychiatric: Normal judgment/insight. Normal mood and affect. Oriented x3.   Abs reviewed from last week, slight elevation in the [facility] consistent with steroid use/infection. BNP was elevated above baseline, kidney function was a bit decreased   ASSESSMENT/PLAN: chronic medical conditions stable at this point, patient is encouraged to keep pulmonology appointment, return to clinic as needed. Labs today to recheck abnormalities noted last week, he states she'll probably get these drawn tomorrow  Dyspnea on exertion - Plan: B Nat Peptide  COPD with chronic bronchitis (HCC)  Congestive heart failure, unspecified HF chronicity,  unspecified heart failure type ([place]) - Plan: B Nat Peptide, BASIC METABOLIC PANEL WITH GFR, CBC  AKI (acute kidney injury) ([place]) - Plan: BASIC METABOLIC PANEL WITH GFR      Follow-up plan: Return in about 3 months (around 06/04/2018) for recheck COPD and BP .  Visit summary with medication list and pertinent instructions was printed for patient to review, alert [place] if any changes needed. All questions at time of visit were answered - patient instructed to contact office with any additional concerns. ER/RTC precautions were reviewed with the patient and understanding verbalized.     Please note: voice recognition software was used to produce this document, and typos may escape review. Please contact Dr. Sheppard Coil for any needed clarifications.

## 2018-03-09 ENCOUNTER — Encounter: Payer: Self-pay | Admitting: Physician Assistant

## 2018-03-10 NOTE — Addendum Note (Signed)
Addended by: Huel Cote on: 03/10/2018 04:29 PM   Modules accepted: Orders

## 2018-03-11 DIAGNOSIS — I509 Heart failure, unspecified: Secondary | ICD-10-CM | POA: Diagnosis not present

## 2018-03-11 DIAGNOSIS — R0609 Other forms of dyspnea: Secondary | ICD-10-CM | POA: Diagnosis not present

## 2018-03-11 DIAGNOSIS — Z7901 Long term (current) use of anticoagulants: Secondary | ICD-10-CM | POA: Diagnosis not present

## 2018-03-11 DIAGNOSIS — N179 Acute kidney failure, unspecified: Secondary | ICD-10-CM | POA: Diagnosis not present

## 2018-03-12 LAB — BASIC METABOLIC PANEL WITH GFR
BUN / CREAT RATIO: 27 (calc) — AB (ref 6–22)
BUN: 35 mg/dL — ABNORMAL HIGH (ref 7–25)
CHLORIDE: 101 mmol/L (ref 98–110)
CO2: 32 mmol/L (ref 20–32)
CREATININE: 1.31 mg/dL — AB (ref 0.70–1.25)
Calcium: 9.4 mg/dL (ref 8.6–10.3)
GFR, Est African American: 68 mL/min/{1.73_m2} (ref >=60)
GFR, Est Non African American: 58 mL/min/{1.73_m2} — ABNORMAL LOW (ref >=60)
GLUCOSE: 112 mg/dL (ref 65–139)
POTASSIUM: 4.9 mmol/L (ref 3.5–5.3)
SODIUM: 140 mmol/L (ref 135–146)

## 2018-03-12 LAB — BRAIN NATRIURETIC PEPTIDE: Brain Natriuretic Peptide: 406 pg/mL — ABNORMAL HIGH (ref <100)

## 2018-03-12 LAB — CBC
HEMATOCRIT: 54 % — AB (ref 38.5–50.0)
Hemoglobin: 17.8 g/dL — ABNORMAL HIGH (ref 13.2–17.1)
MCH: 27.8 pg (ref 27.0–33.0)
MCHC: 33 g/dL (ref 32.0–36.0)
MCV: 84.2 fL (ref 80.0–100.0)
MPV: 10.4 fL (ref 7.5–12.5)
Platelets: 250 10*3/uL (ref 140–400)
RBC: 6.41 10*6/uL — ABNORMAL HIGH (ref 4.20–5.80)
RDW: 14.6 % (ref 11.0–15.0)
WBC: 16 10*3/uL — AB (ref 3.8–10.8)

## 2018-03-25 ENCOUNTER — Ambulatory Visit: Payer: BLUE CROSS/BLUE SHIELD | Admitting: Pulmonary Disease

## 2018-03-25 ENCOUNTER — Encounter: Payer: Self-pay | Admitting: Pulmonary Disease

## 2018-03-25 VITALS — BP 124/78 | HR 71 | Ht 71.0 in | Wt 300.0 lb

## 2018-03-25 DIAGNOSIS — R0602 Shortness of breath: Secondary | ICD-10-CM

## 2018-03-25 DIAGNOSIS — G4719 Other hypersomnia: Secondary | ICD-10-CM

## 2018-03-25 NOTE — Progress Notes (Signed)
Jeffrey Larson    [id number]    [birth year]  Primary Care Physician:Alexander, Lanelle Bal, DO  Referring Physician: [name], [place] [address] [place], [place] [id number]  Chief complaint: Consult for dyspnea, COPD  HPI: 62 year old with history of atrial fibrillation, COPD, coronary artery disease Complains of progressive dyspnea on exertion for the past few years.  He does not have any symptoms at rest.  He has chronic cough with white mucus production.  Started on trelegy about a year ago which is helping.  He is also on albuterol nebulizers and albuterol rescue inhaler He has been seen multiple times this year at his primary care for persistent wheezing, dyspnea.  Treated with steroids, antibiotics.  Also noted to be in CHF exacerbation and Lasix, metoprolol increased.  Echocardiogram in December noted for severe mitral stenosis, RV dilatation and reduction in systolic function.  He follows with Dr. [name], cardiology who feels that his mitral valve is not a big contributor to his symptoms.  He has developed a rash over his right shin and is scheduled to see his primary care tomorrow.  Pets:.  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  Quit in 1998 Travel history: Not significant Relevant family history: Not significant  Outpatient Encounter Medications as of 03/25/2018  Medication Sig  . albuterol (PROVENTIL) (5 MG/ML) 0.5% nebulizer solution Take 0.5 mLs (2.5 mg total) by nebulization every 6 (six) hours as needed for wheezing or shortness of breath.  . Albuterol Sulfate (PROAIR RESPICLICK) 270 (90 Base) MCG/ACT AEPB Inhale 1 puff into the lungs every 6 (six) hours as needed (sob).  . AMBULATORY NON FORMULARY MEDICATION Medication Name: Glucometer, lancets and strip to test every other day. Dx: Diabetes type 2,  E11.9. 100 strip and 100 lancets  . aspirin 81 MG tablet Take 81 mg by mouth daily.  Marland Kitchen atorvastatin (LIPITOR) 40 MG tablet Take 40 mg as directed by mouth.  . benzonatate (TESSALON) 200 MG capsule Take 1 capsule (200 mg total) by mouth 3 (three) times daily as needed for cough.  . cyclobenzaprine (FLEXERIL) 10 MG tablet Take 0.5-1 tablets (5-10 mg total) 3 (three) times daily as needed by mouth.  . Fluticasone-Umeclidin-Vilant (TRELEGY ELLIPTA) 100-62.5-25 MCG/INH AEPB Inhale 1 puff into the lungs daily.  Marland Kitchen ipratropium-albuterol (DUONEB) 0.5-2.5 (3) MG/3ML SOLN Take 3 mLs by nebulization every 2 (two) hours as needed (wheeze, SOB).  Marland Kitchen losartan (COZAAR) 50 MG tablet Take 1 tablet (50 mg total) by mouth daily.  . metoprolol succinate (TOPROL-XL) 100 MG 24 hr tablet Take 1 tablet (100 mg total) by mouth 2 (two) times daily. Take with or immediately following a meal.  . potassium chloride SA (K-DUR,KLOR-CON) 20 MEQ tablet Take 0.5 tablets daily by mouth.  . spironolactone (ALDACTONE) 25 MG tablet Take 1 tablet (25 mg total) by mouth daily.  Marland Kitchen torsemide (DEMADEX) 10 MG tablet Take 1 tablet (10 mg total) by mouth daily. Take additional 10 mg PO if weight is increasing  . valACYclovir (VALTREX) 1000 MG tablet Take 2 tablets (2,000 mg total) by mouth 2 (two) times daily. For one day, as needed for cold sores. Start at first sign of outbreak.  . warfarin (COUMADIN) 4 MG tablet Take 2-4 mg by mouth daily. Take 4 mg every day except on Sunday and Wednesday take 2 mg.  . [  DISCONTINUED] cefdinir (OMNICEF) 300 MG capsule Take 1 capsule (300 mg total) by mouth 2 (two) times daily.  . [DISCONTINUED] guaiFENesin (MUCINEX) 600 MG 12 hr tablet Take 2 tablets (1,200 mg total) by mouth 2 (two) times daily. As needed for cough  . [DISCONTINUED] predniSONE (STERAPRED UNI-PAK 48 TAB) 10 MG (48) TBPK tablet Take by mouth daily. 12 day dosepack po  . [DISCONTINUED] traMADol (ULTRAM) 50 MG tablet Take 1 tablet (50 mg total)  by mouth every 8 (eight) hours as needed.  . nitroGLYCERIN (NITROSTAT) 0.4 MG SL tablet Place 1 tablet (0.4 mg total) under the tongue every 5 (five) minutes x 3 doses as needed for chest pain.  . [DISCONTINUED] amiodarone (PACERONE) 200 MG tablet Take 1 tablet (200 mg total) by mouth 2 (two) times daily.  . [DISCONTINUED] ramipril (ALTACE) 2.5 MG capsule Take 2.5 mg by mouth daily.    Facility-Administered Encounter Medications as of 03/25/2018  Medication  . ipratropium-albuterol (DUONEB) 0.5-2.5 (3) MG/3ML nebulizer solution 3 mL    Allergies as of 03/25/2018 - Review Complete 03/25/2018  Allergen Reaction Noted  . Ramipril  08/12/2014    Past Medical History:  Diagnosis Date  . Afib ([place])   . COPD with chronic bronchitis ([place]) 09/08/2014  . Coronary artery disease   . History of pneumonia    RML on CXR 07/20/14  . Hyperlipidemia   . Myocardial infarction ([place])   . Renal disorder    history kidney stone    Past Surgical History:  Procedure Laterality Date  . APPENDECTOMY    . CARDIOVASCULAR STRESS TEST  03/2014   Borderline reversible ischemic changes at the apex.  Normal LV contractility/EF 52%.  . CORONARY STENT PLACEMENT  7/12013  . LEFT HEART CATHETERIZATION WITH CORONARY ANGIOGRAM N/A 06/12/2012   Procedure: LEFT HEART CATHETERIZATION WITH CORONARY ANGIOGRAM;  Surgeon: Clent Demark, MD;  Location[facility] C[facility] LAB;  Service: Cardiovascular;  Laterality: N/A;  . PERCUTANEOUS CORONARY STENT INTERVENTION (PCI-S) N/A 06/17/2012   Procedure: PERCUTANEOUS CORONARY STENT INTERVENTION (PCI-S);  Surgeon: Clent Demark, MD;  Location: [facility] CATH LAB;  Service: Cardiovascular;  Laterality: N/A;    Family History  Problem Relation Age of Onset  . Heart disease Mother   . Heart disease Father     Social History   Socioeconomic History  . Marital status: Married    Spouse name: Not on file  . Number of children: Not on file  . Years of education: Not on file  . Highest  education level: Not on file  Occupational History  . Not on file  Social Needs  . Financial resource strain: Not on file  . Food insecurity:    Worry: Not on file    Inability: Not on file  . Transportation needs:    Medical: Not on file    Non-medical: Not on file  Tobacco Use  . Smoking status: Former Smoker    Packs/day: 2.00    Years: 15.00    Pack years: 30.00    Types: Cigarettes    Last attempt to quit: 11/26/1996    Years since quitting: 21.3  . Smokeless tobacco: Never Used  Substance and Sexual Activity  . Alcohol use: No  . Drug use: No  . Sexual activity: Yes  Lifestyle  . Physical activity:    Days per week: Not on file    Minutes per session: Not on file  . Stress: Not on file  Relationships  . Social connections:  Talks on phone: Not on file    Gets together: Not on file    Attends religious service: Not on file    Active member of club or organization: Not on file    Attends meetings of clubs or organizations: Not on file    Relationship status: Not on file  . Intimate partner violence:    Fear of current or ex partner: Not on file    Emotionally abused: Not on file    Physically abused: Not on file    Forced sexual activity: Not on file  Other Topics Concern  . Not on file  Social History Narrative  . Not on file   Review of systems: Review of Systems  Constitutional: Negative for fever and chills.  HENT: Negative.   Eyes: Negative for blurred vision.  Respiratory: as per HPI  Cardiovascular: Negative for chest pain and palpitations.  Gastrointestinal: Negative for vomiting, diarrhea, blood per rectum. Genitourinary: Negative for dysuria, urgency, frequency and hematuria.  Musculoskeletal: Negative for myalgias, back pain and joint pain.  Skin: Negative for itching and rash.  Neurological: Negative for dizziness, tremors, focal weakness, seizures and loss of consciousness.  Endo/Heme/Allergies: Negative for environmental allergies.    Psychiatric/Behavioral: Negative for depression, suicidal ideas and hallucinations.  All other systems reviewed and are negative.  Physical Exam: Blood pressure 124/78, pulse 71, height 5\' 11"  (1.803 m), weight 300 lb (136.1 kg), SpO2 97 %. Gen:      No acute distress HEENT:  EOMI, sclera anicteric Neck:     No masses; no thyromegaly Lungs:    Clear to auscultation bilaterally; normal respiratory effort CV:         Regular rate and rhythm; no murmurs Abd:      + bowel sounds; soft, non-tender; no palpable masses, no distension Ext:   Maculopapular rash over right shin. 1+ edema Skin:      Warm and dry; no rash Neuro: alert and oriented x 3 Psych: normal mood and affect  Data Reviewed: Chest x-ray 02/27/2018-cardiomegaly, mild central vascular congestion I have reviewed the images personally.  Echocardiogram 10/29/2017- LVEF 50-55%, severe mitral stenosis, mild dilatation of the left atrium RV cavity is moderately dilated and systolic function is reduced.  No PFO  Assessment:  Assessment for dyspnea on exertion Likely multifactorial from left heart disease, mitral stenosis, COPD, body habitus and deconditioning He is currently on good inhaler regimen with Trelegy and albuterol as needed.  We will continue the same Schedule pulmonary function test for further evaluation  Suspected sleep apnea Echo noted with RV dysfunction.  This is likely from left heart disease.  He will need evaluation for sleep apnea too given his symptoms of snoring and witnessed apneas Schedule split-night sleep study.  Plan/Recommendations: - Pulmonary function test and split-night sleep study - Continue trelegy and albuterol  Marshell Garfinkel MD [facility] 03/25/2018, 3:21 PM  CC: [name], DO

## 2018-03-25 NOTE — Patient Instructions (Signed)
Continue the trelegy and albuterol, nebulizers We will schedule you for pulmonary function test for better evaluation of her lung function We will also schedule you for a sleep study for evaluation of sleep apnea.  Follow-up in 1 to 2 months.

## 2018-03-26 ENCOUNTER — Encounter: Payer: Self-pay | Admitting: Osteopathic Medicine

## 2018-03-26 ENCOUNTER — Ambulatory Visit: Payer: BLUE CROSS/BLUE SHIELD | Admitting: Osteopathic Medicine

## 2018-03-26 VITALS — BP 114/66 | HR 50 | Temp 98.0°F | Wt 299.0 lb

## 2018-03-26 DIAGNOSIS — I872 Venous insufficiency (chronic) (peripheral): Secondary | ICD-10-CM

## 2018-03-26 DIAGNOSIS — D692 Other nonthrombocytopenic purpura: Secondary | ICD-10-CM

## 2018-03-26 DIAGNOSIS — R233 Spontaneous ecchymoses: Secondary | ICD-10-CM | POA: Diagnosis not present

## 2018-03-26 LAB — CBC WITH DIFFERENTIAL/PLATELET
BASOS PCT: 0.6 %
Basophils Absolute: 51 cells/uL (ref 0–200)
Eosinophils Absolute: 111 cells/uL (ref 15–500)
Eosinophils Relative: 1.3 %
HCT: 51.9 % — ABNORMAL HIGH (ref 38.5–50.0)
Hemoglobin: 17.3 g/dL — ABNORMAL HIGH (ref 13.2–17.1)
Lymphs Abs: 1564 cells/uL (ref 850–3900)
MCH: 27.5 pg (ref 27.0–33.0)
MCHC: 33.3 g/dL (ref 32.0–36.0)
MCV: 82.6 fL (ref 80.0–100.0)
MONOS PCT: 7.8 %
MPV: 9.9 fL (ref 7.5–12.5)
Neutro Abs: 6112 cells/uL (ref 1500–7800)
Neutrophils Relative %: 71.9 %
PLATELETS: 282 10*3/uL (ref 140–400)
RBC: 6.28 10*6/uL — ABNORMAL HIGH (ref 4.20–5.80)
RDW: 15.7 % — ABNORMAL HIGH (ref 11.0–15.0)
TOTAL LYMPHOCYTE: 18.4 %
WBC: 8.5 10*3/uL (ref 3.8–10.8)
WBCMIX: 663 {cells}/uL (ref 200–950)

## 2018-03-26 LAB — COMPLETE METABOLIC PANEL WITH GFR
AG Ratio: 1.5 (calc) (ref 1.0–2.5)
ALBUMIN MSPROF: 4.2 g/dL (ref 3.6–5.1)
ALKALINE PHOSPHATASE (APISO): 77 U/L (ref 40–115)
ALT: 28 U/L (ref 9–46)
AST: 21 U/L (ref 10–35)
BILIRUBIN TOTAL: 0.9 mg/dL (ref 0.2–1.2)
BUN / CREAT RATIO: 17 (calc) (ref 6–22)
BUN: 23 mg/dL (ref 7–25)
CHLORIDE: 102 mmol/L (ref 98–110)
CO2: 28 mmol/L (ref 20–32)
Calcium: 9.6 mg/dL (ref 8.6–10.3)
Creat: 1.33 mg/dL — ABNORMAL HIGH (ref 0.70–1.25)
GFR, Est African American: 66 mL/min/{1.73_m2} (ref >=60)
GFR, Est Non African American: 57 mL/min/{1.73_m2} — ABNORMAL LOW (ref >=60)
GLUCOSE: 89 mg/dL (ref 65–99)
Globulin: 2.8 g/dL (calc) (ref 1.9–3.7)
Potassium: 5.1 mmol/L (ref 3.5–5.3)
SODIUM: 141 mmol/L (ref 135–146)
Total Protein: 7 g/dL (ref 6.1–8.1)

## 2018-03-26 MED ORDER — BETAMETHASONE DIPROPIONATE 0.05 % EX OINT: TOPICAL_OINTMENT | Freq: Two times a day (BID) | CUTANEOUS | 1 refills | Status: DC

## 2018-03-26 NOTE — Progress Notes (Signed)
HPI: Jeffrey Larson is a 62 y.o. male who  has a past medical history of Afib ([place]), COPD with chronic bronchitis ([place]) (09/08/2014), Coronary artery disease, History of pneumonia, Hyperlipidemia, Myocardial infarction [facility]), and Renal disorder.  he presents to [facility] today, 03/26/18,  for chief complaint of:  Rash  R lower leg. Tender, itchy with long pants. Present about 4 days. Worried it might be going to L leg also.   He is on warfarin for Afib No headache, no fever, no tick exposure.  Frequent steroid use d/t COPD.          Past medical history, surgical history, and family history reviewed.  Current medication list and allergy/intolerance information reviewed.   (See remainder of HPI, as well as ROS, PE below)    ASSESSMENT/PLAN: The primary encounter diagnosis was Venous stasis dermatitis of left lower extremity. Diagnoses of Purpura ([place]) and Petechiae were also pertinent to this visit.    Meds ordered this encounter  Medications  . betamethasone dipropionate (DIPROLENE) 0.05 % ointment    Sig: Apply topically 2 (two) times daily. To affected area(s) as needed for up to 2 weeks    Dispense:  45 g    Refill:  1    Patient Instructions  This looks like dermatitis (skin irritation) from blood pooling in the veins - called Venous Stasis Dermatitis. If labs don't show concerns for other causes (sometimes vein dermatitis can be from kidney problems or infection, etc), this isn't anything to worry about, though this can cause skin discoloration and severe cases can cause ulceration or infection of the skin. Recommend compression stockings and treat itching with steroid.    Additional patient info printed re: venous stasis dermatitis   Follow-up plan: Return if symptoms worsen or fail to improve.      #################################### ####################################   Outpatient Encounter Medications as of 03/26/2018    Medication Sig Note  . albuterol (PROVENTIL) (5 MG/ML) 0.5% nebulizer solution Take 0.5 mLs (2.5 mg total) by nebulization every 6 (six) hours as needed for wheezing or shortness of breath.   . Albuterol Sulfate (PROAIR RESPICLICK) 696 (90 Base) MCG/ACT AEPB Inhale 1 puff into the lungs every 6 (six) hours as needed (sob).   . AMBULATORY NON FORMULARY MEDICATION Medication Name: Glucometer, lancets and strip to test every other day. Dx: Diabetes type 2, E11.9. 100 strip and 100 lancets   . aspirin 81 MG tablet Take 81 mg by mouth daily.   Marland Kitchen atorvastatin (LIPITOR) 40 MG tablet Take 40 mg as directed by mouth.   . benzonatate (TESSALON) 200 MG capsule Take 1 capsule (200 mg total) by mouth 3 (three) times daily as needed for cough.   . cyclobenzaprine (FLEXERIL) 10 MG tablet Take 0.5-1 tablets (5-10 mg total) 3 (three) times daily as needed by mouth.   . Fluticasone-Umeclidin-Vilant (TRELEGY ELLIPTA) 100-62.5-25 MCG/INH AEPB Inhale 1 puff into the lungs daily.   Marland Kitchen ipratropium-albuterol (DUONEB) 0.5-2.5 (3) MG/3ML SOLN Take 3 mLs by nebulization every 2 (two) hours as needed (wheeze, SOB).   Marland Kitchen losartan (COZAAR) 50 MG tablet Take 1 tablet (50 mg total) by mouth daily.   . metoprolol succinate (TOPROL-XL) 100 MG 24 hr tablet Take 1 tablet (100 mg total) by mouth 2 (two) times daily. Take with or immediately following a meal.   . nitroGLYCERIN (NITROSTAT) 0.4 MG SL tablet Place 1 tablet (0.4 mg total) under the tongue every 5 (five) minutes x 3 doses as needed  for chest pain.   . potassium chloride SA (K-DUR,KLOR-CON) 20 MEQ tablet Take 0.5 tablets daily by mouth.   . spironolactone (ALDACTONE) 25 MG tablet Take 1 tablet (25 mg total) by mouth daily.   Marland Kitchen torsemide (DEMADEX) 10 MG tablet Take 1 tablet (10 mg total) by mouth daily. Take additional 10 mg PO if weight is increasing   . valACYclovir (VALTREX) 1000 MG tablet Take 2 tablets (2,000 mg total) by mouth 2 (two) times daily. For one day, as needed  for cold sores. Start at first sign of outbreak. 03/05/2018: PRN  . warfarin (COUMADIN) 4 MG tablet Take 2-4 mg by mouth daily. Take 4 mg every day except on Sunday and Wednesday take 2 mg.   . [DISCONTINUED] amiodarone (PACERONE) 200 MG tablet Take 1 tablet (200 mg total) by mouth 2 (two) times daily.   . [DISCONTINUED] ramipril (ALTACE) 2.5 MG capsule Take 2.5 mg by mouth daily.     Facility-Administered Encounter Medications as of 03/26/2018  Medication  . ipratropium-albuterol (DUONEB) 0.5-2.5 (3) MG/3ML nebulizer solution 3 mL   Allergies  Allergen Reactions  . Ramipril     cough      Review of Systems:  Constitutional: No recent illness  HEENT: No  headache, no vision change  Cardiac: No  chest pain  Respiratory:  No  shortness of breath. No  Cough  Gastrointestinal: No  abdominal pain  Musculoskeletal: No new myalgia/arthralgia  Skin: +Rash  Hem/Onc: +easy bruising/bleeding, No  abnormal lumps/bumps  Neurologic: No  weakness, No  Dizziness   Exam:  BP 114/66 (BP Location: Left Arm, Patient Position: Sitting, Cuff Size: Large)   Pulse (!) 50   Temp 98 F (36.7 C) (Oral)   Wt 299 lb (135.6 kg)   BMI 41.70 kg/m   Constitutional: VS see above. General Appearance: alert, well-developed, well-nourished, NAD  Eyes: Normal lids and conjunctive, non-icteric sclera  Ears, Nose, Mouth, Throat: MMM, Normal external inspection ears/nares/mouth/lips/gums.  Neck: No masses, trachea midline.   Respiratory: Normal respiratory effort.   Musculoskeletal: Gait normal. Symmetric and independent movement of all extremities  Neurological: Normal balance/coordination. No tremor.  Skin: warm, dry, intact. Blanching petechiae around nonblanching purpura on RLE, see photos above   Psychiatric: Normal judgment/insight. Normal mood and affect. Oriented x3.   Visit summary with medication list and pertinent instructions was printed for patient to review, alert [place] if any changes  needed. All questions at time of visit were answered - patient instructed to contact office with any additional concerns. ER/RTC precautions were reviewed with the patient and understanding verbalized.   Follow-up plan: Return if symptoms worsen or fail to improve.   Please note: voice recognition software was used to produce this document, and typos may escape review. Please contact Dr. Sheppard Coil for any needed clarifications.

## 2018-03-26 NOTE — Patient Instructions (Signed)
This looks like dermatitis (skin irritation) from blood pooling in the veins - called Venous Stasis Dermatitis. If labs don't show concerns for other causes (sometimes vein dermatitis can be from kidney problems or infection, etc), this isn't anything to worry about, though this can cause skin discoloration and severe cases can cause ulceration or infection of the skin. Recommend compression stockings and treat itching with steroid.    Chronic Venous Insufficiency Chronic venous insufficiency, also called venous stasis, is a condition that prevents blood from being pumped effectively through the veins in your legs. Blood may no longer be pumped effectively from the legs back to the heart. This condition can range from mild to severe. With proper treatment, you should be able to continue with an active life. What are the causes? Chronic venous insufficiency occurs when the vein walls become stretched, weakened, or damaged, or when valves within the vein are damaged. Some common causes of this include:  High blood pressure inside the veins (venous hypertension).  Increased blood pressure in the leg veins from long periods of sitting or standing.  A blood clot that blocks blood flow in a vein (deep vein thrombosis, DVT).  Inflammation of a vein (phlebitis) that causes a blood clot to form.  Tumors in the pelvis that cause blood to back up.  What increases the risk? The following factors may make you more likely to develop this condition:  Having a family history of this condition.  Obesity.  Pregnancy.  Living without enough physical activity or exercise (sedentary lifestyle).  Smoking.  Having a job that requires long periods of standing or sitting in one place.  Being a certain age. Women in their 86s and 8s and men in their 35s are more likely to develop this condition.  What are the signs or symptoms? Symptoms of this condition include:  Veins that are enlarged, bulging, or  twisted (varicose veins).  Skin breakdown or ulcers.  Reddened or discolored skin on the front of the leg.  Brown, smooth, tight, and painful skin just above the ankle, usually on the inside of the leg (lipodermatosclerosis).  Swelling.  How is this diagnosed? This condition may be diagnosed based on:  Your medical history.  A physical exam.  Tests, such as: ? A procedure that creates an image of a blood vessel and nearby organs and provides information about blood flow through the blood vessel (duplex ultrasound). ? A procedure that tests blood flow (plethysmography). ? A procedure to look at the veins using X-ray and dye (venogram).  How is this treated? The goals of treatment are to help you return to an active life and to minimize pain or disability. Treatment depends on the severity of your condition, and it may include:  Wearing compression stockings. These can help relieve symptoms and help prevent your condition from getting worse. However, they do not cure the condition.  Sclerotherapy. This is a procedure involving an injection of a material that "dissolves" damaged veins.  Surgery. This may involve: ? Removing a diseased vein (vein stripping). ? Cutting off blood flow through the vein (laser ablation surgery). ? Repairing a valve.  Follow these instructions at home:  Wear compression stockings as told by your health care provider. These stockings help to prevent blood clots and reduce swelling in your legs.  Take over-the-counter and prescription medicines only as told by your health care provider.  Stay active by exercising, walking, or doing different activities. Ask your health care provider what activities are  safe for you and how much exercise you need.  Drink enough fluid to keep your urine clear or pale yellow.  Do not use any products that contain nicotine or tobacco, such as cigarettes and e-cigarettes. If you need help quitting, ask your health care  provider.  Keep all follow-up visits as told by your health care provider. This is important. Contact a health care provider if:  You have redness, swelling, or more pain in the affected area.  You see a red streak or line that extends up or down from the affected area.  You have skin breakdown or a loss of skin in the affected area, even if the breakdown is small.  You get an injury in the affected area. Get help right away if:  You get an injury and an open wound in the affected area.  You have severe pain that does not get better with medicine.  You have sudden numbness or weakness in the foot or ankle below the affected area, or you have trouble moving your foot or ankle.  You have a fever and you have worse or persistent symptoms.  You have chest pain.  You have shortness of breath. Summary  Chronic venous insufficiency, also called venous stasis, is a condition that prevents blood from being pumped effectively through the veins in your legs.  Chronic venous insufficiency occurs when the vein walls become stretched, weakened, or damaged, or when valves within the vein are damaged.  Treatment for this condition depends on how severe your condition is, and it may involve wearing compression stockings or having a procedure.  Make sure you stay active by exercising, walking, or doing different activities. Ask your health care provider what activities are safe for you and how much exercise you need. This information is not intended to replace advice given to you by your health care provider. Make sure you discuss any questions you have with your health care provider. Document Released: 03/18/2007 Document Revised: 10/01/2016 Document Reviewed: 10/01/2016 Elsevier Interactive Patient Education  2017 [employer].

## 2018-03-27 ENCOUNTER — Ambulatory Visit: Payer: BLUE CROSS/BLUE SHIELD | Admitting: Osteopathic Medicine

## 2018-03-27 ENCOUNTER — Other Ambulatory Visit: Payer: Self-pay

## 2018-03-27 DIAGNOSIS — G4719 Other hypersomnia: Secondary | ICD-10-CM

## 2018-03-27 LAB — URINALYSIS
Bilirubin Urine: NEGATIVE
Glucose, UA: NEGATIVE
HGB URINE DIPSTICK: NEGATIVE
KETONES UR: NEGATIVE
LEUKOCYTES UA: NEGATIVE
Nitrite: NEGATIVE
Protein, ur: NEGATIVE
SPECIFIC GRAVITY, URINE: 1.011 (ref 1.001–1.03)

## 2018-03-27 LAB — URINALYSIS, MICROSCOPIC ONLY
BACTERIA UA: NONE SEEN /HPF
Hyaline Cast: NONE SEEN /LPF
RBC / HPF: NONE SEEN /HPF (ref 0–2)
SQUAMOUS EPITHELIAL / LPF: NONE SEEN /HPF (ref <=5)
WBC, UA: NONE SEEN /HPF (ref 0–5)

## 2018-03-27 LAB — PATHOLOGIST SMEAR REVIEW

## 2018-03-27 LAB — PROTIME-INR
INR: 2.8 — AB
Prothrombin Time: 29.5 s — ABNORMAL HIGH (ref 9.0–11.5)

## 2018-04-14 ENCOUNTER — Other Ambulatory Visit: Payer: Self-pay | Admitting: Cardiology

## 2018-04-14 DIAGNOSIS — I482 Chronic atrial fibrillation: Secondary | ICD-10-CM | POA: Diagnosis not present

## 2018-04-14 DIAGNOSIS — I2510 Atherosclerotic heart disease of native coronary artery without angina pectoris: Secondary | ICD-10-CM | POA: Diagnosis not present

## 2018-04-14 DIAGNOSIS — R0609 Other forms of dyspnea: Secondary | ICD-10-CM | POA: Diagnosis not present

## 2018-04-14 DIAGNOSIS — I252 Old myocardial infarction: Secondary | ICD-10-CM | POA: Diagnosis not present

## 2018-04-14 DIAGNOSIS — R079 Chest pain, unspecified: Secondary | ICD-10-CM

## 2018-04-24 ENCOUNTER — Ambulatory Visit (HOSPITAL_COMMUNITY)
Admission: RE | Admit: 2018-04-24 | Discharge: 2018-04-24 | Disposition: A | Discharge status: Home / Self Care | Payer: BLUE CROSS/BLUE SHIELD | Source: Ambulatory Visit | Attending: Cardiology | Admitting: Cardiology

## 2018-04-24 DIAGNOSIS — R079 Chest pain, unspecified: Secondary | ICD-10-CM | POA: Insufficient documentation

## 2018-04-24 DIAGNOSIS — R9431 Abnormal electrocardiogram [ECG] [EKG]: Secondary | ICD-10-CM | POA: Diagnosis not present

## 2018-04-24 DIAGNOSIS — I10 Essential (primary) hypertension: Secondary | ICD-10-CM | POA: Diagnosis not present

## 2018-04-24 DIAGNOSIS — R0609 Other forms of dyspnea: Secondary | ICD-10-CM | POA: Diagnosis not present

## 2018-04-24 DIAGNOSIS — I2510 Atherosclerotic heart disease of native coronary artery without angina pectoris: Secondary | ICD-10-CM | POA: Diagnosis not present

## 2018-04-24 MED ORDER — TECHNETIUM TC 99M TETROFOSMIN IV KIT
30.0000 | PACK | Freq: Once | INTRAVENOUS | Status: AC | PRN
  Administered 2018-04-24: 30 via INTRAVENOUS

## 2018-04-25 ENCOUNTER — Ambulatory Visit (HOSPITAL_COMMUNITY)
Admission: RE | Admit: 2018-04-25 | Discharge: 2018-04-25 | Disposition: A | Discharge status: Home / Self Care | Payer: BLUE CROSS/BLUE SHIELD | Source: Ambulatory Visit | Attending: Cardiology | Admitting: Cardiology

## 2018-04-25 DIAGNOSIS — R079 Chest pain, unspecified: Secondary | ICD-10-CM | POA: Diagnosis not present

## 2018-04-25 DIAGNOSIS — R0609 Other forms of dyspnea: Secondary | ICD-10-CM | POA: Diagnosis not present

## 2018-04-25 MED ORDER — REGADENOSON 0.4 MG/5ML IV SOLN
0.4000 mg | Freq: Once | INTRAVENOUS | Status: AC
  Administered 2018-04-25: 0.4 mg via INTRAVENOUS

## 2018-04-25 MED ORDER — TECHNETIUM TC 99M TETROFOSMIN IV KIT
30.0000 | PACK | Freq: Once | INTRAVENOUS | Status: AC | PRN
  Administered 2018-04-25: 30 via INTRAVENOUS

## 2018-04-25 MED ORDER — REGADENOSON 0.4 MG/5ML IV SOLN
INTRAVENOUS | Status: AC
  Filled 2018-04-25: qty 5

## 2018-04-29 DIAGNOSIS — G4733 Obstructive sleep apnea (adult) (pediatric): Secondary | ICD-10-CM | POA: Diagnosis not present

## 2018-04-30 DIAGNOSIS — G4733 Obstructive sleep apnea (adult) (pediatric): Secondary | ICD-10-CM | POA: Diagnosis not present

## 2018-05-01 ENCOUNTER — Other Ambulatory Visit: Payer: Self-pay | Admitting: *Deleted

## 2018-05-01 DIAGNOSIS — G4719 Other hypersomnia: Secondary | ICD-10-CM

## 2018-05-08 ENCOUNTER — Encounter: Payer: Self-pay | Admitting: Osteopathic Medicine

## 2018-05-08 ENCOUNTER — Ambulatory Visit: Payer: BLUE CROSS/BLUE SHIELD | Admitting: Osteopathic Medicine

## 2018-05-08 DIAGNOSIS — J441 Chronic obstructive pulmonary disease with (acute) exacerbation: Secondary | ICD-10-CM | POA: Diagnosis not present

## 2018-05-08 DIAGNOSIS — J449 Chronic obstructive pulmonary disease, unspecified: Secondary | ICD-10-CM | POA: Diagnosis not present

## 2018-05-08 MED ORDER — PREDNISONE 20 MG PO TABS: 20.0000 mg | ORAL_TABLET | Freq: Two times a day (BID) | ORAL | 0 refills | Status: DC

## 2018-05-08 MED ORDER — AZITHROMYCIN 250 MG PO TABS: ORAL_TABLET | ORAL | 0 refills | Status: DC

## 2018-05-08 NOTE — Progress Notes (Signed)
HPI: Jeffrey Larson is a 62 y.o. male who  has a past medical history of Afib ([place]), COPD with chronic bronchitis ([place]) (09/08/2014), Coronary artery disease, History of pneumonia, Hyperlipidemia, Myocardial infarction [facility]), and Renal disorder.  he presents to [facility] today, 05/08/18,  for chief complaint of:  Cough  Feels like it's when he got bronchitis awhile ago. He has family coming down soon and his wife has been sick recently as well with cough.  History of COPD, he is following with pulmonology, has been relatively stable in terms of able to perform activities of daily living relatively okay, however he seems to have really frequent exacerbations requiring steroid use.   Past medical history, surgical history, and family history reviewed.  Current medication list and allergy/intolerance information reviewed.   (See remainder of HPI, ROS, Phys Exam below)    ASSESSMENT/PLAN: Diagnoses of COPD with chronic bronchitis ([place]) and COPD exacerbation ([place]) were pertinent to this visit.  Usual exacerbation pattern for him.  Will trial steroids, antibiotics given abnormal lung sounds, he declined a chest x-ray today.  Advised if worse, he should proceed to the hospital.  Advised nebulizer use every few hours at home while awake.  Meds ordered this encounter  Medications  . predniSONE (DELTASONE) 20 MG tablet    Sig: Take 1 tablet (20 mg total) by mouth 2 (two) times daily with a meal.    Dispense:  10 tablet    Refill:  0  . azithromycin (ZITHROMAX) 250 MG tablet    Sig: 2 tabs po x1 on Day 1, then 1 tab po daily on Days 2 - 5    Dispense:  6 tablet    Refill:  0     Follow-up plan: Return if symptoms fail to improve - if worse, go to hospital!  .     ############################################ ############################################ ############################################ ############################################    Outpatient Encounter Medications as of 05/08/2018  Medication Sig Note  . albuterol (PROVENTIL) (5 MG/ML) 0.5% nebulizer solution Take 0.5 mLs (2.5 mg total) by nebulization every 6 (six) hours as needed for wheezing or shortness of breath.   . Albuterol Sulfate (PROAIR RESPICLICK) 893 (90 Base) MCG/ACT AEPB Inhale 1 puff into the lungs every 6 (six) hours as needed (sob).   . AMBULATORY NON FORMULARY MEDICATION Medication Name: Glucometer, lancets and strip to test every other day. Dx: Diabetes type 2, E11.9. 100 strip and 100 lancets   . aspirin 81 MG tablet Take 81 mg by mouth daily.   Marland Kitchen atorvastatin (LIPITOR) 40 MG tablet Take 40 mg as directed by mouth.   . benzonatate (TESSALON) 200 MG capsule Take 1 capsule (200 mg total) by mouth 3 (three) times daily as needed for cough.   . betamethasone dipropionate (DIPROLENE) 0.05 % ointment Apply topically 2 (two) times daily. To affected area(s) as needed for up to 2 weeks   . cyclobenzaprine (FLEXERIL) 10 MG tablet Take 0.5-1 tablets (5-10 mg total) 3 (three) times daily as needed by mouth.   . Fluticasone-Umeclidin-Vilant (TRELEGY ELLIPTA) 100-62.5-25 MCG/INH AEPB Inhale 1 puff into the lungs daily.   Marland Kitchen ipratropium-albuterol (DUONEB) 0.5-2.5 (3) MG/3ML SOLN Take 3 mLs by nebulization every 2 (two) hours as needed (wheeze, SOB).   Marland Kitchen losartan (COZAAR) 50 MG tablet Take 1 tablet (50 mg total) by mouth daily.   . metoprolol succinate (TOPROL-XL) 100 MG 24 hr tablet Take 1 tablet (100 mg total) by mouth 2 (two) times daily. Take with or  immediately following a meal.   . potassium chloride SA (K-DUR,KLOR-CON) 20 MEQ tablet Take 0.5 tablets daily by mouth.   . spironolactone (ALDACTONE) 25 MG tablet Take 1 tablet (25 mg total) by mouth daily.   Marland Kitchen torsemide  (DEMADEX) 10 MG tablet Take 1 tablet (10 mg total) by mouth daily. Take additional 10 mg PO if weight is increasing   . valACYclovir (VALTREX) 1000 MG tablet Take 2 tablets (2,000 mg total) by mouth 2 (two) times daily. For one day, as needed for cold sores. Start at first sign of outbreak. 03/05/2018: PRN  . warfarin (COUMADIN) 4 MG tablet Take 2-4 mg by mouth daily. Take 4 mg every day except on Sunday and Wednesday take 2 mg.   . nitroGLYCERIN (NITROSTAT) 0.4 MG SL tablet Place 1 tablet (0.4 mg total) under the tongue every 5 (five) minutes x 3 doses as needed for chest pain.    No facility-administered encounter medications on file as of 05/08/2018.    Allergies  Allergen Reactions  . Ramipril     cough      Review of Systems:  Constitutional: +recent illness  HEENT: No  headache, no vision change  Cardiac: No  chest pain, No  pressure, No palpitations  Respiratory:  +shortness of breath. +Cough  Gastrointestinal: No  abdominal pain, no change on bowel habits  Musculoskeletal: No new myalgia/arthralgia  Skin: No  Rash  Neurologic: No  weakness, No  Dizziness   Exam:  BP 121/86 (BP Location: Left Arm, Patient Position: Sitting, Cuff Size: Large)   Pulse 83   Temp 97.7 F (36.5 C) (Oral)   Wt (!) 305 lb 3.2 oz (138.4 kg)   SpO2 96%   BMI 42.57 kg/m   Constitutional: VS see above. General Appearance: alert, well-developed, well-nourished, NAD  Eyes: Normal lids and conjunctive, non-icteric sclera  Ears, Nose, Mouth, Throat: MMM, Normal external inspection ears/nares/mouth/lips/gums.  Neck: No masses, trachea midline.   Respiratory: Normal respiratory effort. + Diffuse significant hoarse breath sounds and wheeze, no rhonchi, no rales  Cardiovascular: S1/S2 normal, +murmur, no rub/gallop auscultated.  Irregularly irregular rhythm.   Musculoskeletal: Gait normal. Symmetric and independent movement of all extremities  Neurological: Normal balance/coordination. No  tremor.  Skin: warm, dry, intact.   Psychiatric: Normal judgment/insight. Normal mood and affect. Oriented x3.   Visit summary with medication list and pertinent instructions was printed for patient to review, advised to alert [place] if any changes needed. All questions at time of visit were answered - patient instructed to contact office with any additional concerns. ER/RTC precautions were reviewed with the patient and understanding verbalized.   Follow-up plan: Return if symptoms fail to improve - if worse, go to hospital! .  Note: Total time spent 25 minutes, greater than 50% of the visit was spent face-to-face counseling and coordinating care for the following: Diagnoses of COPD with chronic bronchitis ([place]) and COPD exacerbation ([place]) were pertinent to this visit.Marland Kitchen  Please note: voice recognition software was used to produce this document, and typos may escape review. Please contact Dr. Sheppard Coil for any needed clarifications.

## 2018-05-09 ENCOUNTER — Encounter: Payer: Self-pay | Admitting: Osteopathic Medicine

## 2018-05-19 ENCOUNTER — Ambulatory Visit: Payer: BLUE CROSS/BLUE SHIELD | Admitting: Pulmonary Disease

## 2018-05-26 DIAGNOSIS — Z7901 Long term (current) use of anticoagulants: Secondary | ICD-10-CM | POA: Diagnosis not present

## 2018-05-28 DIAGNOSIS — I38 Endocarditis, valve unspecified: Secondary | ICD-10-CM | POA: Diagnosis not present

## 2018-06-03 DIAGNOSIS — I482 Chronic atrial fibrillation: Secondary | ICD-10-CM | POA: Diagnosis not present

## 2018-06-03 DIAGNOSIS — R0609 Other forms of dyspnea: Secondary | ICD-10-CM | POA: Diagnosis not present

## 2018-06-03 DIAGNOSIS — I342 Nonrheumatic mitral (valve) stenosis: Secondary | ICD-10-CM | POA: Diagnosis not present

## 2018-06-05 ENCOUNTER — Encounter: Payer: Self-pay | Admitting: Osteopathic Medicine

## 2018-06-05 ENCOUNTER — Ambulatory Visit (INDEPENDENT_AMBULATORY_CARE_PROVIDER_SITE_OTHER): Payer: BLUE CROSS/BLUE SHIELD | Admitting: Osteopathic Medicine

## 2018-06-05 VITALS — BP 112/70 | HR 70 | Temp 98.0°F | Ht 71.0 in | Wt 304.0 lb

## 2018-06-05 DIAGNOSIS — J449 Chronic obstructive pulmonary disease, unspecified: Secondary | ICD-10-CM

## 2018-06-05 DIAGNOSIS — R0609 Other forms of dyspnea: Secondary | ICD-10-CM | POA: Diagnosis not present

## 2018-06-05 DIAGNOSIS — G4730 Sleep apnea, unspecified: Secondary | ICD-10-CM | POA: Diagnosis not present

## 2018-06-05 NOTE — Progress Notes (Signed)
HPI: Jeffrey Larson is a 62 y.o. male who  has a past medical history of Afib ([place]), COPD with chronic bronchitis ([place]) (09/08/2014), Coronary artery disease, History of pneumonia, Hyperlipidemia, Myocardial infarction [facility]), and Renal disorder.  he presents to [name] today, 06/05/18,  for chief complaint of:  COPD follow-up   Will be undergoing cardiac cath which is plannedw/ Dr [name] for 06/10/18.   Lat pulm visit: 03/25/18: continue Trelegy daily and albuterol prn. PFT results unavailable, sleep study (+) mild OSA.   Today, reports he was just coming to this visit since he had it on the schedule, no complaints or questions.  Feels that most recent treatment for bronchitis was fine, we saw him about a month ago for COPD exacerbation.  He reports persistent dyspnea on exertion, no change or worsening.  No fever or chills.    Past medical history, surgical history, and family history reviewed.  Current medication list and allergy/intolerance information reviewed.   (See remainder of HPI, ROS, Phys Exam below)    ASSESSMENT/PLAN: Hopefully upcoming cardiac surgery to correct mitral valve may help with dyspnea symptoms and other problems.  Advised patient I am here if he needs anything, he is stable today, no change to medications  COPD with chronic bronchitis (HCC)  Mild sleep apnea  Dyspnea on exertion      Follow-up plan: Return for recheck COPD/Heart in 2-3 months, sooner if needed based on cardiolofy advice .     ############################################ ############################################ ############################################ ############################################    Outpatient Encounter Medications as of 06/05/2018  Medication Sig Note  . albuterol (PROVENTIL) (5 MG/ML) 0.5% nebulizer solution Take 0.5 mLs (2.5 mg total) by nebulization every 6 (six) hours as needed for wheezing or shortness of  breath.   . Albuterol Sulfate (PROAIR RESPICLICK) 505 (90 Base) MCG/ACT AEPB Inhale 1 puff into the lungs every 6 (six) hours as needed (sob).   . AMBULATORY NON FORMULARY MEDICATION Medication Name: Glucometer, lancets and strip to test every other day. Dx: Diabetes type 2, E11.9. 100 strip and 100 lancets   . aspirin 81 MG tablet Take 81 mg by mouth daily.   Marland Kitchen atorvastatin (LIPITOR) 40 MG tablet Take 40 mg as directed by mouth.   . betamethasone dipropionate (DIPROLENE) 0.05 % ointment Apply topically 2 (two) times daily. To affected area(s) as needed for up to 2 weeks   . cyclobenzaprine (FLEXERIL) 10 MG tablet Take 0.5-1 tablets (5-10 mg total) 3 (three) times daily as needed by mouth.   . Fluticasone-Umeclidin-Vilant (TRELEGY ELLIPTA) 100-62.5-25 MCG/INH AEPB Inhale 1 puff into the lungs daily.   Marland Kitchen ipratropium-albuterol (DUONEB) 0.5-2.5 (3) MG/3ML SOLN Take 3 mLs by nebulization every 2 (two) hours as needed (wheeze, SOB).   . metoprolol succinate (TOPROL-XL) 100 MG 24 hr tablet Take 1 tablet (100 mg total) by mouth 2 (two) times daily. Take with or immediately following a meal.   . potassium chloride SA (K-DUR,KLOR-CON) 20 MEQ tablet Take 0.5 tablets daily by mouth.   . predniSONE (DELTASONE) 20 MG tablet Take 1 tablet (20 mg total) by mouth 2 (two) times daily with a meal.   . sacubitril-valsartan (ENTRESTO) 24-26 MG Take 1 tablet by mouth 2 (two) times daily.   Marland Kitchen spironolactone (ALDACTONE) 25 MG tablet Take 1 tablet (25 mg total) by mouth daily.   Marland Kitchen torsemide (DEMADEX) 10 MG tablet Take 1 tablet (10 mg total) by mouth daily. Take additional 10 mg PO if weight is increasing   .  warfarin (COUMADIN) 4 MG tablet Take 2-4 mg by mouth daily. Take 4 mg every day except on Sunday and Wednesday take 2 mg.   . nitroGLYCERIN (NITROSTAT) 0.4 MG SL tablet Place 1 tablet (0.4 mg total) under the tongue every 5 (five) minutes x 3 doses as needed for chest pain.   . [DISCONTINUED] azithromycin (ZITHROMAX)  250 MG tablet 2 tabs po x1 on Day 1, then 1 tab po daily on Days 2 - 5 (Patient not taking: Reported on 06/05/2018)   . [DISCONTINUED] benzonatate (TESSALON) 200 MG capsule Take 1 capsule (200 mg total) by mouth 3 (three) times daily as needed for cough. (Patient not taking: Reported on 06/05/2018)   . [DISCONTINUED] losartan (COZAAR) 50 MG tablet Take 1 tablet (50 mg total) by mouth daily.   . [DISCONTINUED] valACYclovir (VALTREX) 1000 MG tablet Take 2 tablets (2,000 mg total) by mouth 2 (two) times daily. For one day, as needed for cold sores. Start at first sign of outbreak. 03/05/2018: PRN   No facility-administered encounter medications on file as of 06/05/2018.    Allergies  Allergen Reactions  . Ramipril     cough      Review of Systems:  Constitutional: No recent illness  HEENT: No  headache, no vision change  Cardiac: No  chest pain, No  pressure, No palpitations  Respiratory:  +shortness of breath. +Cough  Neurologic: No  weakness, No  Dizziness  Psychiatric: No  concerns with depression, No  concerns with anxiety  Exam:  BP 112/70   Pulse 70   Temp 98 F (36.7 C) (Oral)   Ht 5\' 11"  (1.803 m)   Wt (!) 304 lb (137.9 kg)   SpO2 97%   BMI 42.40 kg/m   Constitutional: VS see above. General Appearance: alert, well-developed, well-nourished, NAD  Eyes: Normal lids and conjunctive, non-icteric sclera  Ears, Nose, Mouth, Throat: MMM, Normal external inspection ears/nares/mouth/lips/gums.  Neck: No masses, trachea midline.   Respiratory: Normal respiratory effort. +wheeze and coarse breath sounds bilaterally in all lung fields, no rhonchi, no rales  Cardiovascular: S1/S2 normal, +murmur, no rub/gallop auscultated.  Irregularly irregular rhythm, rate is okay, no lower extremity edema or JVD  Musculoskeletal: Gait normal. Symmetric and independent movement of all extremities  Neurological: Normal balance/coordination. No tremor.  Skin: warm, dry, intact.    Psychiatric: Normal judgment/insight. Normal mood and affect. Oriented x3.   Visit summary with medication list and pertinent instructions was printed for patient to review, advised to alert [place] if any changes needed. All questions at time of visit were answered - patient instructed to contact office with any additional concerns. ER/RTC precautions were reviewed with the patient and understanding verbalized.   Follow-up plan: Return for recheck COPD/Heart in 2-3 months, sooner if needed based on cardiolofy advice .  Note: Total time spent 15 minutes, greater than 50% of the visit was spent face-to-face counseling and coordinating care for the following: The primary encounter diagnosis was COPD with chronic bronchitis ([place]). Diagnoses of Mild sleep apnea and Dyspnea on exertion were also pertinent to this visit.Marland Kitchen  Please note: voice recognition software was used to produce this document, and typos may escape review. Please contact Dr. Sheppard Coil for any needed clarifications.

## 2018-06-06 ENCOUNTER — Ambulatory Visit: Payer: BLUE CROSS/BLUE SHIELD | Admitting: Pulmonary Disease

## 2018-06-10 ENCOUNTER — Encounter (HOSPITAL_COMMUNITY)
Admission: RE | Disposition: A | Discharge status: Home / Self Care | Payer: Self-pay | Source: Home / Self Care | Attending: Cardiovascular Disease

## 2018-06-10 ENCOUNTER — Other Ambulatory Visit: Payer: Self-pay

## 2018-06-10 ENCOUNTER — Encounter (HOSPITAL_COMMUNITY): Payer: Self-pay

## 2018-06-10 ENCOUNTER — Inpatient Hospital Stay (HOSPITAL_COMMUNITY): Payer: BLUE CROSS/BLUE SHIELD

## 2018-06-10 ENCOUNTER — Inpatient Hospital Stay (HOSPITAL_COMMUNITY)
Admission: RE | Admit: 2018-06-10 | Discharge: 2018-06-17 | DRG: 287 | Disposition: A | Discharge status: Home / Self Care | Payer: BLUE CROSS/BLUE SHIELD | Attending: Cardiology | Admitting: Cardiology

## 2018-06-10 DIAGNOSIS — Z7951 Long term (current) use of inhaled steroids: Secondary | ICD-10-CM | POA: Diagnosis not present

## 2018-06-10 DIAGNOSIS — I252 Old myocardial infarction: Secondary | ICD-10-CM | POA: Diagnosis not present

## 2018-06-10 DIAGNOSIS — Z87891 Personal history of nicotine dependence: Secondary | ICD-10-CM | POA: Diagnosis not present

## 2018-06-10 DIAGNOSIS — Z87442 Personal history of urinary calculi: Secondary | ICD-10-CM | POA: Diagnosis not present

## 2018-06-10 DIAGNOSIS — R0609 Other forms of dyspnea: Secondary | ICD-10-CM | POA: Diagnosis not present

## 2018-06-10 DIAGNOSIS — Z01810 Encounter for preprocedural cardiovascular examination: Secondary | ICD-10-CM | POA: Diagnosis not present

## 2018-06-10 DIAGNOSIS — Z955 Presence of coronary angioplasty implant and graft: Secondary | ICD-10-CM | POA: Diagnosis not present

## 2018-06-10 DIAGNOSIS — I5042 Chronic combined systolic (congestive) and diastolic (congestive) heart failure: Secondary | ICD-10-CM | POA: Diagnosis present

## 2018-06-10 DIAGNOSIS — E785 Hyperlipidemia, unspecified: Secondary | ICD-10-CM | POA: Diagnosis not present

## 2018-06-10 DIAGNOSIS — Z6841 Body Mass Index (BMI) 40.0 and over, adult: Secondary | ICD-10-CM

## 2018-06-10 DIAGNOSIS — I340 Nonrheumatic mitral (valve) insufficiency: Secondary | ICD-10-CM | POA: Diagnosis not present

## 2018-06-10 DIAGNOSIS — I081 Rheumatic disorders of both mitral and tricuspid valves: Secondary | ICD-10-CM | POA: Diagnosis not present

## 2018-06-10 DIAGNOSIS — Z7982 Long term (current) use of aspirin: Secondary | ICD-10-CM | POA: Diagnosis not present

## 2018-06-10 DIAGNOSIS — I481 Persistent atrial fibrillation: Secondary | ICD-10-CM | POA: Diagnosis present

## 2018-06-10 DIAGNOSIS — J449 Chronic obstructive pulmonary disease, unspecified: Secondary | ICD-10-CM | POA: Diagnosis present

## 2018-06-10 DIAGNOSIS — I050 Rheumatic mitral stenosis: Secondary | ICD-10-CM

## 2018-06-10 DIAGNOSIS — I342 Nonrheumatic mitral (valve) stenosis: Secondary | ICD-10-CM | POA: Diagnosis present

## 2018-06-10 DIAGNOSIS — I110 Hypertensive heart disease with heart failure: Secondary | ICD-10-CM | POA: Diagnosis present

## 2018-06-10 DIAGNOSIS — Z8701 Personal history of pneumonia (recurrent): Secondary | ICD-10-CM

## 2018-06-10 DIAGNOSIS — I712 Thoracic aortic aneurysm, without rupture: Secondary | ICD-10-CM | POA: Diagnosis not present

## 2018-06-10 DIAGNOSIS — Z8249 Family history of ischemic heart disease and other diseases of the circulatory system: Secondary | ICD-10-CM

## 2018-06-10 DIAGNOSIS — I482 Chronic atrial fibrillation: Secondary | ICD-10-CM | POA: Diagnosis present

## 2018-06-10 DIAGNOSIS — Z888 Allergy status to other drugs, medicaments and biological substances status: Secondary | ICD-10-CM

## 2018-06-10 DIAGNOSIS — Z7901 Long term (current) use of anticoagulants: Secondary | ICD-10-CM

## 2018-06-10 DIAGNOSIS — M1990 Unspecified osteoarthritis, unspecified site: Secondary | ICD-10-CM | POA: Diagnosis present

## 2018-06-10 DIAGNOSIS — I2510 Atherosclerotic heart disease of native coronary artery without angina pectoris: Secondary | ICD-10-CM | POA: Diagnosis not present

## 2018-06-10 DIAGNOSIS — I4811 Longstanding persistent atrial fibrillation: Secondary | ICD-10-CM | POA: Diagnosis present

## 2018-06-10 DIAGNOSIS — R0602 Shortness of breath: Secondary | ICD-10-CM | POA: Diagnosis present

## 2018-06-10 DIAGNOSIS — G4733 Obstructive sleep apnea (adult) (pediatric): Secondary | ICD-10-CM | POA: Diagnosis present

## 2018-06-10 DIAGNOSIS — Z79899 Other long term (current) drug therapy: Secondary | ICD-10-CM | POA: Diagnosis not present

## 2018-06-10 DIAGNOSIS — I272 Pulmonary hypertension, unspecified: Secondary | ICD-10-CM | POA: Diagnosis not present

## 2018-06-10 DIAGNOSIS — I714 Abdominal aortic aneurysm, without rupture: Secondary | ICD-10-CM | POA: Diagnosis not present

## 2018-06-10 DIAGNOSIS — I10 Essential (primary) hypertension: Secondary | ICD-10-CM | POA: Diagnosis present

## 2018-06-10 DIAGNOSIS — I052 Rheumatic mitral stenosis with insufficiency: Secondary | ICD-10-CM | POA: Diagnosis present

## 2018-06-10 HISTORY — PX: RIGHT/LEFT HEART CATH AND CORONARY ANGIOGRAPHY: CATH118266

## 2018-06-10 HISTORY — DX: Rheumatic mitral stenosis with insufficiency: I05.2

## 2018-06-10 HISTORY — DX: Essential (primary) hypertension: I10

## 2018-06-10 HISTORY — DX: Pulmonary hypertension, unspecified: I27.20

## 2018-06-10 HISTORY — PX: TEE WITHOUT CARDIOVERSION: SHX5443

## 2018-06-10 HISTORY — DX: Chronic combined systolic (congestive) and diastolic (congestive) heart failure: I50.42

## 2018-06-10 HISTORY — DX: Longstanding persistent atrial fibrillation: I48.11

## 2018-06-10 LAB — CBC
HEMATOCRIT: 55.3 % — AB (ref 39.0–52.0)
Hemoglobin: 17.2 g/dL — ABNORMAL HIGH (ref 13.0–17.0)
MCH: 27.4 pg (ref 26.0–34.0)
MCHC: 31.1 g/dL (ref 30.0–36.0)
MCV: 88.1 fL (ref 78.0–100.0)
Platelets: 253 10*3/uL (ref 150–400)
RBC: 6.28 MIL/uL — ABNORMAL HIGH (ref 4.22–5.81)
RDW: 17.6 % — AB (ref 11.5–15.5)
WBC: 9.7 10*3/uL (ref 4.0–10.5)

## 2018-06-10 LAB — POCT I-STAT 3, ART BLOOD GAS (G3+)
Acid-base deficit: 1 mmol/L (ref 0.0–2.0)
Bicarbonate: 25 mmol/L (ref 20.0–28.0)
O2 SAT: 58 %
PCO2 ART: 43.8 mmHg (ref 32.0–48.0)
PO2 ART: 32 mmHg — AB (ref 83.0–108.0)
TCO2: 26 mmol/L (ref 22–32)
pH, Arterial: 7.365 (ref 7.350–7.450)

## 2018-06-10 LAB — POCT I-STAT 3, VENOUS BLOOD GAS (G3P V)
Acid-base deficit: 3 mmol/L — ABNORMAL HIGH (ref 0.0–2.0)
BICARBONATE: 21.1 mmol/L (ref 20.0–28.0)
O2 Saturation: 92 %
PCO2 VEN: 35.3 mmHg — AB (ref 44.0–60.0)
PH VEN: 7.385 (ref 7.250–7.430)
TCO2: 22 mmol/L (ref 22–32)
pO2, Ven: 66 mmHg — ABNORMAL HIGH (ref 32.0–45.0)

## 2018-06-10 LAB — BASIC METABOLIC PANEL
Anion gap: 10 (ref 5–15)
BUN: 22 mg/dL (ref 8–23)
CALCIUM: 9.5 mg/dL (ref 8.9–10.3)
CO2: 23 mmol/L (ref 22–32)
CREATININE: 1.31 mg/dL — AB (ref 0.61–1.24)
Chloride: 106 mmol/L (ref 98–111)
GFR calc Af Amer: >60 mL/min (ref >60)
GFR calc non Af Amer: 57 mL/min — ABNORMAL LOW (ref >60)
GLUCOSE: 125 mg/dL — AB (ref 70–99)
Potassium: 4.8 mmol/L (ref 3.5–5.1)
Sodium: 139 mmol/L (ref 135–145)

## 2018-06-10 LAB — PROTIME-INR
INR: 1.25
Prothrombin Time: 15.6 seconds — ABNORMAL HIGH (ref 11.4–15.2)

## 2018-06-10 SURGERY — ECHOCARDIOGRAM, TRANSESOPHAGEAL
Anesthesia: Moderate Sedation

## 2018-06-10 SURGERY — RIGHT/LEFT HEART CATH AND CORONARY ANGIOGRAPHY
Anesthesia: LOCAL

## 2018-06-10 MED ORDER — ASPIRIN 81 MG PO CHEW: 81.0000 mg | CHEWABLE_TABLET | ORAL | Status: DC

## 2018-06-10 MED ORDER — MIDAZOLAM HCL 2 MG/2ML IJ SOLN
INTRAMUSCULAR | Status: AC
  Filled 2018-06-10: qty 2

## 2018-06-10 MED ORDER — LIDOCAINE HCL (PF) 1 % IJ SOLN
INTRAMUSCULAR | Status: AC
  Filled 2018-06-10: qty 30

## 2018-06-10 MED ORDER — LIDOCAINE HCL (PF) 1 % IJ SOLN
INTRAMUSCULAR | Status: DC | PRN
  Administered 2018-06-10: 20 mL

## 2018-06-10 MED ORDER — SODIUM CHLORIDE 0.9% FLUSH: 3.0000 mL | Freq: Two times a day (BID) | INTRAVENOUS | Status: DC

## 2018-06-10 MED ORDER — SODIUM CHLORIDE 0.9% FLUSH
3.0000 mL | Freq: Two times a day (BID) | INTRAVENOUS | Status: DC
  Administered 2018-06-10 – 2018-06-17 (×9): 3 mL via INTRAVENOUS

## 2018-06-10 MED ORDER — FENTANYL CITRATE (PF) 100 MCG/2ML IJ SOLN
INTRAMUSCULAR | Status: AC
  Filled 2018-06-10: qty 2

## 2018-06-10 MED ORDER — HEPARIN (PORCINE) IN NACL 100-0.45 UNIT/ML-% IJ SOLN
2000.0000 [IU]/h | INTRAMUSCULAR | Status: DC
  Administered 2018-06-10: 1500 [IU]/h via INTRAVENOUS
  Administered 2018-06-11 – 2018-06-14 (×5): 1800 [IU]/h via INTRAVENOUS
  Administered 2018-06-15: 2000 [IU]/h via INTRAVENOUS
  Administered 2018-06-15: 1800 [IU]/h via INTRAVENOUS
  Administered 2018-06-16 – 2018-06-17 (×4): 2000 [IU]/h via INTRAVENOUS
  Filled 2018-06-10 (×13): qty 250

## 2018-06-10 MED ORDER — MIDAZOLAM HCL 2 MG/2ML IJ SOLN
INTRAMUSCULAR | Status: DC | PRN
  Administered 2018-06-10: 1 mg via INTRAVENOUS

## 2018-06-10 MED ORDER — HEPARIN (PORCINE) IN NACL 1000-0.9 UT/500ML-% IV SOLN
INTRAVENOUS | Status: AC
  Filled 2018-06-10: qty 1000

## 2018-06-10 MED ORDER — HEPARIN (PORCINE) IN NACL 1000-0.9 UT/500ML-% IV SOLN
INTRAVENOUS | Status: DC | PRN
  Administered 2018-06-10 (×2): 500 mL

## 2018-06-10 MED ORDER — MIDAZOLAM HCL 10 MG/2ML IJ SOLN
INTRAMUSCULAR | Status: DC | PRN
  Administered 2018-06-10: 1 mg via INTRAVENOUS
  Administered 2018-06-10 (×2): 2 mg via INTRAVENOUS

## 2018-06-10 MED ORDER — SODIUM CHLORIDE 0.9 % IV SOLN: 250.0000 mL | INTRAVENOUS | Status: DC | PRN

## 2018-06-10 MED ORDER — SODIUM CHLORIDE 0.9 % IV SOLN
INTRAVENOUS | Status: DC
  Administered 2018-06-10: 08:00:00 via INTRAVENOUS

## 2018-06-10 MED ORDER — ACETAMINOPHEN 325 MG PO TABS
650.0000 mg | ORAL_TABLET | ORAL | Status: DC | PRN
  Administered 2018-06-11: 650 mg via ORAL
  Filled 2018-06-10: qty 2

## 2018-06-10 MED ORDER — FENTANYL CITRATE (PF) 100 MCG/2ML IJ SOLN
INTRAMUSCULAR | Status: DC | PRN
  Administered 2018-06-10 (×3): 25 ug via INTRAVENOUS

## 2018-06-10 MED ORDER — FENTANYL CITRATE (PF) 100 MCG/2ML IJ SOLN
INTRAMUSCULAR | Status: DC | PRN
  Administered 2018-06-10 (×2): 25 ug via INTRAVENOUS

## 2018-06-10 MED ORDER — BUTAMBEN-TETRACAINE-BENZOCAINE 2-2-14 % EX AERO
INHALATION_SPRAY | CUTANEOUS | Status: DC | PRN
  Administered 2018-06-10: 2 via TOPICAL

## 2018-06-10 MED ORDER — SODIUM CHLORIDE BACTERIOSTATIC 0.9 % IJ SOLN
INTRAMUSCULAR | Status: DC | PRN
  Administered 2018-06-10: 9 mL via INTRAVENOUS

## 2018-06-10 MED ORDER — SODIUM CHLORIDE 0.9% FLUSH: 3.0000 mL | INTRAVENOUS | Status: DC | PRN

## 2018-06-10 MED ORDER — SODIUM CHLORIDE 0.9 % IV SOLN: INTRAVENOUS | Status: AC

## 2018-06-10 MED ORDER — IOHEXOL 350 MG/ML SOLN
INTRAVENOUS | Status: DC | PRN
  Administered 2018-06-10: 65 mL via INTRA_ARTERIAL

## 2018-06-10 MED ORDER — ONDANSETRON HCL 4 MG/2ML IJ SOLN: 4.0000 mg | Freq: Four times a day (QID) | INTRAMUSCULAR | Status: DC | PRN

## 2018-06-10 MED ORDER — SODIUM CHLORIDE 0.9 % IV SOLN: INTRAVENOUS | Status: DC

## 2018-06-10 MED ORDER — MIDAZOLAM HCL 5 MG/ML IJ SOLN
INTRAMUSCULAR | Status: AC
  Filled 2018-06-10: qty 2

## 2018-06-10 SURGICAL SUPPLY — 13 items
CATH INFINITI 5FR MULTPACK ANG (CATHETERS) ×2 IMPLANT
CATH SWAN GANZ 7F STRAIGHT (CATHETERS) ×2 IMPLANT
GUIDEWIRE .025 260CM (WIRE) ×2 IMPLANT
KIT HEART LEFT (KITS) ×2 IMPLANT
NEEDLE SMART REG 18GX2-3/4 (NEEDLE) ×2 IMPLANT
PACK CARDIAC CATHETERIZATION (CUSTOM PROCEDURE TRAY) ×2 IMPLANT
SHEATH AVANTI 11CM 7FR (SHEATH) ×2 IMPLANT
SHEATH PINNACLE 5F 10CM (SHEATH) ×2 IMPLANT
SYR MEDRAD MARK V 150ML (SYRINGE) ×2 IMPLANT
TRANSDUCER W/STOPCOCK (MISCELLANEOUS) ×4 IMPLANT
TUBING ART PRESS 72  MALE/FEM (TUBING) ×1
TUBING ART PRESS 72 MALE/FEM (TUBING) ×1 IMPLANT
WIRE EMERALD 3MM-J .035X150CM (WIRE) ×2 IMPLANT

## 2018-06-10 NOTE — CV Procedure (Signed)
INDICATIONS:   The patient is 62 year old male with atrial fibrilation and MS has exertional dyspnea.  PROCEDURE:  Informed consent was discussed including risks, benefits and alternatives for the procedure.  Risks include, but are not limited to, cough, sore throat, vomiting, nausea, somnolence, esophageal and stomach trauma or perforation, bleeding, low blood pressure, aspiration, pneumonia, infection, trauma to the teeth and death.    Patient was given sedation.  The oropharynx was anesthetized with topical lidocaine.  The transesophageal probe was inserted in the esophagus and stomach and multiple views were obtained.  Agitated saline was used after the transesophageal probe was removed from the body.  The patient was kept under observation until the patient left the procedure room.  The patient left the procedure room in stable condition.   COMPLICATIONS:  There were no immediate complications.  FINDINGS:  1. LEFT VENTRICLE: The left ventricle is normal in structure and moderate dysfunction with generalized hypokinesia and EF 35-40 %.  Wall motion is abnormal.  No thrombus or masses seen in the left ventricle.  2. RIGHT VENTRICLE:  The right ventricle is normal in structure and function without any thrombus or masses.    3. LEFT ATRIUM:  The left atrium is dilated without any thrombus or masses. There for spontaneous contrast, "smoke" in the cavity.  4. LEFT ATRIAL APPENDAGE:  The left atrial appendage has small echogenic density suggestive of thrombus near base.  5. RIGHT ATRIUM:  The right atrium is free of any thrombus or masses.    6. ATRIAL SEPTUM:  The atrial septum is normal without any ASD or PFO. Negative sonicated saline injection test for PFO.  7. MITRAL VALVE:  The mitral valve is heavily calcified with non-mobile anterior leaflet. P2 segment of posterior leaflet prolapsing in LA with moderate regurgitation. Valve area by pressure half time and planimetry close to 0.8 to 1.0 cm  2.  8. TRICUSPID VALVE:  The tricuspid valve is normal in structure and function with moderate eccentric jet of regurgitation, no masses, stenosis or vegetations.  9. AORTIC VALVE:  The aortic valve is normal in structure and function with mild regurgitation, no masses, stenosis or vegetations.  10. PULMONIC VALVE:  The pulmonic valve is normal in structure and function without regurgitation, masses, stenosis or vegetations.  11. AORTIC ARCH, ASCENDING AND DESCENDING AORTA:  The aorta had mild atherosclerosis in the ascending or descending aorta.  The aortic arch was normal.  12.  Superior Vena Cava : No thrombus or catheter.  13.  Pulmonary Veins: Visible.  14.  Pulmonary artery: visible and normal.   IMPRESSION:   1. Moderate LV systolic dysfunction. 2. Severe mitral valve stenosis with prolapse of P2 segment and moderate MR. 3. Moderate TR. 4. Minimal to mild AI. 5. No PFO. 6. Smoke in LA and possible thrombus in LA.  RECOMMENDATIONS:    CVTS consult. Resume heparin.Marland Kitchen

## 2018-06-10 NOTE — Progress Notes (Signed)
ANTICOAGULATION CONSULT NOTE - Initial Consult  Pharmacy Consult:  Heparin Indication:  ACS s/p cath  Allergies  Allergen Reactions  . Ramipril     cough    Patient Measurements: Height: 5\' 11"  (180.3 cm) Weight: (!) 304 lb (137.9 kg) IBW/kg (Calculated) : 75.3 Heparin Dosing Weight: 107 kg  Vital Signs: Temp: 98.1 F (36.7 C) (07/16 0703) Temp Source: Oral (07/16 0703) BP: 118/76 (07/16 1300) Pulse Rate: 64 (07/16 1300)  Labs: Recent Labs    06/10/18 0713  HGB 17.2*  HCT 55.3*  PLT 253  LABPROT 15.6*  INR 1.25  CREATININE 1.31*    Estimated Creatinine Clearance: 84 mL/min (A) (by C-G formula based on SCr of 1.31 mg/dL (H)).   Medical History: Past Medical History:  Diagnosis Date  . Afib ([place])   . COPD with chronic bronchitis ([place]) 09/08/2014  . Coronary artery disease   . History of pneumonia    RML on CXR 07/20/14  . Hyperlipidemia   . Myocardial infarction ([place])   . Renal disorder    history kidney stone      Assessment: 7 YOM with history of Afib on Coumadin PTA, last dose on 06/05/18.  Patient underwent cath today and found to have severe MV calcification requiring CVTS evaluation for CABG.  Pharmacy consulted to initiate IV heparin 8 hours post cath.  Sheath removed around 1000 today.  No bleeding/hematoma documented.   Goal of Therapy:  Heparin level 0.3-0.7 units/ml Monitor platelets by anticoagulation protocol: Yes    Plan:  At 1800, start IV heparin gtt at 1500 units/hr, no bolus post cath Check 6 hr heparin level Daily heparin level and CBC Monitor for s/sx of bleeding/hematoma F/U with resuming home meds   Demisha Nokes D. Mina Marble, PharmD, BCPS, [place] 06/10/2018, 2:00 PM

## 2018-06-10 NOTE — H&P (Signed)
Referring Physician:  WILBERTO CONSOLE is an 62 y.o. male.                       Chief Complaint: Shortness of breath  HPI: 62 year old male with past medical history of Atrial fibrillation, COPD, Chronic bronchitis, CAD with Xience 4.0 x 23 mm stent placement in Proximal RCA in 2013, h/o pneumonia, Hyperlipidemia, h/o kidney stone has h/o Mitral valve stenosis and exertional dyspnea. He is here for right and left heart catheterization.  Past Medical History:  Diagnosis Date  . Afib ([place])   . COPD with chronic bronchitis ([place]) 09/08/2014  . Coronary artery disease   . History of pneumonia    RML on CXR 07/20/14  . Hyperlipidemia   . Myocardial infarction ([place])   . Renal disorder    history kidney stone      Past Surgical History:  Procedure Laterality Date  . APPENDECTOMY    . CARDIOVASCULAR STRESS TEST  03/2014   Borderline reversible ischemic changes at the apex.  Normal LV contractility/EF 52%.  . CORONARY STENT PLACEMENT  7/12013  . LEFT HEART CATHETERIZATION WITH CORONARY ANGIOGRAM N/A 06/12/2012   Procedure: LEFT HEART CATHETERIZATION WITH CORONARY ANGIOGRAM;  Surgeon: Clent Demark, MD;  Location: [facility] C[facility] LAB;  Service: Cardiovascular;  Laterality: N/A;  . PERCUTANEOUS CORONARY STENT INTERVENTION (PCI-S) N/A 06/17/2012   Procedure: PERCUTANEOUS CORONARY STENT INTERVENTION (PCI-S);  Surgeon: Clent Demark, MD;  Location: [facility] CATH LAB;  Service: Cardiovascular;  Laterality: N/A;    Family History  Problem Relation Age of Onset  . Heart disease Mother   . Heart disease Father    Social History:  reports that he quit smoking about 21 years ago. His smoking use included cigarettes. He has a 30.00 pack-year smoking history. He has never used smokeless tobacco. He reports that he does not drink alcohol or use drugs.  Allergies:  Allergies  Allergen Reactions  . Ramipril     cough    Medications Prior to Admission  Medication Sig Dispense Refill  . aspirin 81 MG tablet  Take 81 mg by mouth daily.    Marland Kitchen atorvastatin (LIPITOR) 40 MG tablet Take 40 mg by mouth daily.   2  . Fluticasone-Umeclidin-Vilant (TRELEGY ELLIPTA) 100-62.5-25 MCG/INH AEPB Inhale 1 puff into the lungs daily. 1 each 5  . furosemide (LASIX) 40 MG tablet Take 40 mg by mouth 3 (three) times daily.    Marland Kitchen ipratropium-albuterol (DUONEB) 0.5-2.5 (3) MG/3ML SOLN Take 3 mLs by nebulization every 2 (two) hours as needed (wheeze, SOB). 60 mL 3  . metoprolol succinate (TOPROL-XL) 50 MG 24 hr tablet Take 50 mg by mouth 2 (two) times daily. Take with or immediately following a meal.    . nitroGLYCERIN (NITROSTAT) 0.4 MG SL tablet Place 1 tablet (0.4 mg total) under the tongue every 5 (five) minutes x 3 doses as needed for chest pain. 25 tablet 3  . potassium chloride SA (K-DUR,KLOR-CON) 20 MEQ tablet Take 40 mEq by mouth daily.   0  . sacubitril-valsartan (ENTRESTO) 24-26 MG Take 1 tablet by mouth 2 (two) times daily.    Marland Kitchen spironolactone (ALDACTONE) 25 MG tablet Take 1 tablet (25 mg total) by mouth daily.    Marland Kitchen warfarin (COUMADIN) 4 MG tablet Take 2-4 mg by mouth daily. Take 4 mg every day except on Monday and Saturday and Sunday take 2 mg.    . Albuterol Sulfate (PROAIR RESPICLICK) 563 (90 Base)  MCG/ACT AEPB Inhale 1 puff into the lungs every 6 (six) hours as needed (sob). 1 each 1  . AMBULATORY NON FORMULARY MEDICATION Medication Name: Glucometer, lancets and strip to test every other day. Dx: Diabetes type 2, E11.9. 100 strip and 100 lancets 1 Units PRN    No results found for this or any previous visit (from the past 48 hour(s)). No results found.  Review Of Systems Constitutional: No fever, chills, positive Chronic weight gain. Eyes: No vision change, wears glasses. No discharge or pain. Ears: No hearing loss, No tinnitus. Respiratory: No asthma, positive COPD, pneumonias, shortness of breath. No hemoptysis. Cardiovascular: Occasional chest pain, palpitation, leg edema. Gastrointestinal: No nausea,  vomiting, diarrhea, constipation. No GI bleed. No hepatitis. Genitourinary: No dysuria, hematuria, kidney stone. No incontinance. Neurological: No headache, stroke, seizures.  Psychiatry: No psych facility admission for anxiety, depression, suicide. No detox. Skin: No rash. Musculoskeletal: Positive joint pain, no fibromyalgia. No neck pain, back pain. Lymphadenopathy: No lymphadenopathy. Hematology: No anemia or easy bruising.   Blood pressure (!) 126/106, pulse 71, temperature 98.1 F (36.7 C), temperature source Oral, height 5\' 11"  (1.803 m), weight (!) 137.9 kg (304 lb), SpO2 97 %. Body mass index is 42.4 kg/m. General appearance: alert, cooperative, appears stated age and no distress Head: Normocephalic, atraumatic. Eyes: Hazel eyes, pink conjunctiva, corneas clear. PERRL, EOM's intact. Neck: No adenopathy, no carotid bruit, no JVD, supple, symmetrical, trachea midline and thyroid not enlarged. Resp: Clear to auscultation bilaterally. Mild wheezing with cough. Cardio: Irregular rate and rhythm, S1, S2 normal, II/VI systolic murmur, no click, rub or gallop GI: Soft, non-tender; bowel sounds normal; no organomegaly. Extremities: 1 + edema of both lower legs, no cyanosis or clubbing. Skin: Warm and dry.  Neurologic: Alert and oriented X 3, normal strength. Normal coordination.  Assessment/Plan Mitral stenosis Exertional dyspnea COPD Obesity Hypertension Hyperlipidemia  R + L heart catheterization. Patient understood procedure and complications,  Birdie Riddle, MD  06/10/2018, 7:23 AM

## 2018-06-10 NOTE — Progress Notes (Signed)
Subjective:  See printed H&P in the chart. Patient tolerated left and right cardiac catheterization noted to have patent stents with mild to moderate mid RCA stenosis with severe mitral stenosis and pulmonary hypertension.  Scheduled for TEE later this afternoon  Objective:  Vital Signs in the last 24 hours: Temp:  [98.1 F (36.7 C)] 98.1 F (36.7 C) (07/16 0703) Pulse Rate:  [25-103] 64 (07/16 1300) Resp:  [10-23] 17 (07/16 1300) BP: (93-148)/(53-106) 118/76 (07/16 1300) SpO2:  [90 %-98 %] 94 % (07/16 1300) Weight:  [137.9 kg (304 lb)] 137.9 kg (304 lb) (07/16 0703)  Intake/Output from previous day: No intake/output data recorded. Intake/Output from this shift: Total I/O In: 323 [I.V.:323] Out: -   Physical Exam: Neck: no adenopathy, no carotid bruit, no JVD and supple, symmetrical, trachea midline Lungs: clear to auscultation bilaterally Heart: irregularly irregular rhythm, S1, S2 normal and [id number] and diastolic murmur noted Abdomen: soft, non-tender; bowel sounds normal; no masses,  no organomegaly Extremities: extremities normal, atraumatic, no cyanosis or edema and right groin stable  Lab Results: Recent Labs    06/10/18 0713  WBC 9.7  HGB 17.2*  PLT 253   Recent Labs    06/10/18 0713  NA 139  K 4.8  CL 106  CO2 23  GLUCOSE 125*  BUN 22  CREATININE 1.31*   No results for input(s): TROPONINI in the last 72 hours.  Invalid input(s): CK, MB Hepatic Function Panel No results for input(s): PROT, ALBUMIN, AST, ALT, ALKPHOS, BILITOT, BILIDIR, IBILI in the last 72 hours. No results for input(s): CHOL in the last 72 hours. No results for input(s): PROTIME in the last 72 hours.  Imaging: Imaging results have been reviewed and No results found.  Cardiac Studies:  Assessment/Plan:  Severe mitral stenosis. Pulmonary hypertension. Coronary artery disease, history of inferior wall MI in the past, status post multivessel PCI in the past. Chronic atrial  fibrillation. Compensated systolic congestive heart failure. Hypertension. COPD Morbid obesity. Obstructive sleep apnea. Hyperlipidemia. History of kidney stones. Plan Continue present management. Scheduled for TEE later today. CVTS consult has been called  LOS: 0 days    [name] 06/10/2018, 3:39 PM

## 2018-06-10 NOTE — Progress Notes (Signed)
Dr. Doylene Canard in to speak w/patient. Now going to admit patient.

## 2018-06-10 NOTE — Progress Notes (Signed)
  Echocardiogram Echocardiogram Transesophageal has been performed.  Jeffrey Larson 06/10/2018, 5:14 PM

## 2018-06-10 NOTE — Progress Notes (Signed)
Site area: rt groin fa and fv sheaths Site Prior to Removal:  Level 0 Pressure Applied For: 20 minutes Manual:   yes Patient Status During Pull:  stable Post Pull Site:  Level  0 Post Pull Instructions Given:  yes Post Pull Pulses Present: rt dp palpable Dressing Applied:  Gauze and teghaderm Bedrest begins @ 1010 Comments:

## 2018-06-11 ENCOUNTER — Telehealth: Payer: Self-pay | Admitting: *Deleted

## 2018-06-11 ENCOUNTER — Other Ambulatory Visit: Payer: Self-pay | Admitting: *Deleted

## 2018-06-11 DIAGNOSIS — I050 Rheumatic mitral stenosis: Secondary | ICD-10-CM

## 2018-06-11 LAB — CBC
HCT: 52 % (ref 39.0–52.0)
Hemoglobin: 16 g/dL (ref 13.0–17.0)
MCH: 27.5 pg (ref 26.0–34.0)
MCHC: 30.8 g/dL (ref 30.0–36.0)
MCV: 89.3 fL (ref 78.0–100.0)
PLATELETS: 245 10*3/uL (ref 150–400)
RBC: 5.82 MIL/uL — ABNORMAL HIGH (ref 4.22–5.81)
RDW: 17.4 % — AB (ref 11.5–15.5)
WBC: 9.5 10*3/uL (ref 4.0–10.5)

## 2018-06-11 LAB — HEPARIN LEVEL (UNFRACTIONATED)
HEPARIN UNFRACTIONATED: 0.28 [IU]/mL — AB (ref 0.30–0.70)
HEPARIN UNFRACTIONATED: 0.34 [IU]/mL (ref 0.30–0.70)
Heparin Unfractionated: 0.17 IU/mL — ABNORMAL LOW (ref 0.30–0.70)

## 2018-06-11 MED ORDER — WARFARIN - PHARMACIST DOSING INPATIENT
Freq: Every day | Status: DC
  Administered 2018-06-13 – 2018-06-14 (×2): 1
  Administered 2018-06-16: 18:00:00

## 2018-06-11 MED ORDER — WARFARIN SODIUM 3 MG PO TABS
6.0000 mg | ORAL_TABLET | Freq: Once | ORAL | Status: AC
  Administered 2018-06-11: 6 mg via ORAL
  Filled 2018-06-11: qty 2

## 2018-06-11 MED ORDER — FUROSEMIDE 40 MG PO TABS
40.0000 mg | ORAL_TABLET | Freq: Every day | ORAL | Status: DC
  Administered 2018-06-11 – 2018-06-14 (×4): 40 mg via ORAL
  Filled 2018-06-11 (×4): qty 1

## 2018-06-11 MED ORDER — SACUBITRIL-VALSARTAN 24-26 MG PO TABS
1.0000 | ORAL_TABLET | Freq: Two times a day (BID) | ORAL | Status: DC
  Administered 2018-06-11 – 2018-06-13 (×5): 1 via ORAL
  Filled 2018-06-11 (×5): qty 1

## 2018-06-11 NOTE — Discharge Instructions (Addendum)
Mitral Valve Replacement Mitral valve replacement is surgery to replace the mitral valve with an artificial (prosthetic) valve. You may need this procedure if your mitral valve is too damaged to repair, such as from rheumatic disease. Three types of prosthetic valves are available:  Mechanical valves made entirely from prosthetic materials.  Donor valves from human donors. These are used only in certain situations.  Biological valves made from animal tissues.  There are two types of mitral valve replacement surgeries:  Traditional mitral valve replacement surgery. This is done with a large incision in the chest.  Minimally invasive mitral valve replacement surgery. This is done with a smaller incision in the chest.  You and your surgeon will decide which type of valve is best for you and which type of surgery you will have. Tell a health care provider about:  Any allergies you have.  All medicines you are taking, including vitamins, herbs, eye drops, creams, and over-the-counter medicines.  Any problems you or family members have had with anesthetic medicine.  Any blood disorders you have.  Any surgeries you have had.  Any medical conditions you have.  Whether you are pregnant or may be pregnant. What are the risks? Generally, this is a safe procedure. However, problems may occur, including:  Infection of the new valve.  Bleeding.  Allergic reactions to medicines.  Damage to other structures or organs.  Blood clotting caused by the new valve. Replacement with a mechanical valve requires lifelong treatment with medicine to prevent blood clots.  Valve failure.  What happens before the procedure? Medicines  Ask your health care provider about: ? Changing or stopping your regular medicines. This is especially important if you are taking diabetes medicines or blood thinners. ? Taking medicines such as aspirin and ibuprofen. These medicines can thin your blood. Do not  take these medicines before your procedure if your health care provider instructs you not to.  You may be given antibiotic medicine to help prevent infection. Staying hydrated Follow instructions from your health care provider about hydration, which may include:  Up to 2 hours before the procedure - you may continue to drink clear liquids, such as water, clear fruit juice, black coffee, and plain tea.  Eating and drinking restrictions Follow instructions from your health care provider about eating and drinking, which may include:  8 hours before the procedure - stop eating heavy meals or foods such as meat, fried foods, or fatty foods.  6 hours before the procedure - stop eating light meals or foods, such as toast or cereal.  6 hours before the procedure - stop drinking milk or drinks that contain milk.  2 hours before the procedure - stop drinking clear liquids.  General instructions  Do not use any products that contain nicotine or tobacco for as long as possible before your procedure. This includes cigarettes and e-cigarettes. If you need help quitting, ask your health care provider.  Ask your health care provider how your surgical site will be marked or identified.  You may be asked to shower with a germ-killing soap.  You may have tests, such as: ? Electrocardiogram (ECG). This test records electrical activity in the heart. ? An echocardiogram. This test creates ultrasound images of the heart that allow your health care provider to see how the heart valves work while your heart is beating.  You may have a blood or urine sample taken.  Plan to have someone take you home from the hospital or clinic.  If you will be going home right after the procedure, plan to have someone with you for 24 hours. What happens during the procedure?  To lower your risk of infection: ? Your health care team will wash or sanitize their hands. ? Your skin will be washed with soap. ? Hair may be  removed from the surgical area.  An IV tube will be inserted into one of your veins.  You will be given a medicine to make you fall asleep (general anesthetic).  You will be placed on a machine that provides oxygen to your blood while the heart is undergoing surgery (heart-lung bypass machine).  If you are having traditional surgery, a large incision will be made in your chest. If you are having minimally invasive surgery, a smaller incision will be made in your chest.  Your heart may be cooled to slow or stop the heartbeat.  Your damaged mitral valve will be removed, and the prosthetic valve will be sewn into place.  Your incision may be closed with stitches (sutures) and covered with a bandage (dressing). The procedure may vary among health care providers and hospitals. What happens after the procedure?  Your blood pressure, heart rate, breathing rate, and blood oxygen level will be monitored until the medicines you were given have worn off.  You may have to wear compression stockings. These stockings help to prevent blood clots and reduce swelling in your legs.  You may have some chest pain. You will be given pain medicine as needed.  Do not drive until your health care provider approves. This information is not intended to replace advice given to you by your health care provider. Make sure you discuss any questions you have with your health care provider. Document Released: 03/15/2005 Document Revised: 08/24/2016 Document Reviewed: 08/24/2016 Elsevier Interactive Patient Education  2018 [employer].  Heart Failure Heart failure is a condition in which the heart has trouble pumping blood because it has become weak or stiff. This means that the heart does not pump blood efficiently for the body to work well. For some people with heart failure, fluid may back up into the lungs and there may be swelling (edema) in the lower legs. Heart failure is usually a long-term (chronic)  condition. It is important for you to take good care of yourself and follow the treatment plan from your health care provider. What are the causes? This condition is caused by some health problems, including:  High blood pressure (hypertension). Hypertension causes the heart muscle to work harder than normal. High blood pressure eventually causes the heart to become stiff and weak.  Coronary artery disease (CAD). CAD is the buildup of cholesterol and fat (plaques) in the arteries of the heart.  Heart attack (myocardial infarction). Injured tissue, which is caused by the heart attack, does not contract as well and the heart's ability to pump blood is weakened.  Abnormal heart valves. When the heart valves do not open and close properly, the heart muscle must pump harder to keep the blood flowing.  Heart muscle disease (cardiomyopathy or myocarditis). Heart muscle disease is damage to the heart muscle from a variety of causes, such as drug or alcohol abuse, infections, or unknown causes. These can increase the risk of heart failure.  Lung disease. When the lungs do not work properly, the heart must work harder.  What increases the risk? Risk of heart failure increases as a person ages. This condition is also more likely to develop in people who:  Are overweight.  Are male.  Smoke or chew tobacco.  Abuse alcohol or illegal drugs.  Have taken medicines that can damage the heart, such as chemotherapy drugs.  Have diabetes. ? High blood sugar (glucose) is associated with high fat (lipid) levels in the blood. ? Diabetes can also damage tiny blood vessels that carry nutrients to the heart muscle.  Have abnormal heart rhythms.  Have thyroid problems.  Have low blood counts (anemia).  What are the signs or symptoms? Symptoms of this condition include:  Shortness of breath with activity, such as when climbing stairs.  Persistent cough.  Swelling of the feet, ankles, legs, or  abdomen.  Unexplained weight gain.  Difficulty breathing when lying flat (orthopnea).  Waking from sleep because of the need to sit up and get more air.  Rapid heartbeat.  Fatigue and loss of energy.  Feeling light-headed, dizzy, or close to fainting.  Loss of appetite.  Nausea.  Increased urination during the night (nocturia).  Confusion.  How is this diagnosed? This condition is diagnosed based on:  Medical history, symptoms, and a physical exam.  Diagnostic tests, which may include: ? Echocardiogram. ? Electrocardiogram (ECG). ? Chest X-ray. ? Blood tests. ? Exercise stress test. ? Radionuclide scans. ? Cardiac catheterization and angiogram.  How is this treated? Treatment for this condition is aimed at managing the symptoms of heart failure. Medicines, behavioral changes, or other treatments may be necessary to treat heart failure. Medicines These may include:  Angiotensin-converting enzyme (ACE) inhibitors. This type of medicine blocks the effects of a blood protein called angiotensin-converting enzyme. ACE inhibitors relax (dilate) the blood vessels and help to lower blood pressure.  Angiotensin receptor blockers (ARBs). This type of medicine blocks the actions of a blood protein called angiotensin. ARBs dilate the blood vessels and help to lower blood pressure.  Water pills (diuretics). Diuretics cause the kidneys to remove salt and water from the blood. The extra fluid is removed through urination, leaving a lower volume of blood that the heart has to pump.  Beta blockers. These improve heart muscle strength and they prevent the heart from beating too quickly.  Digoxin. This increases the force of the heartbeat.  Healthy behavior changes These may include:  Reaching and maintaining a healthy weight.  Stopping smoking or chewing tobacco.  Eating heart-healthy foods.  Limiting or avoiding alcohol.  Stopping use of street drugs (illegal  drugs).  Physical activity.  Other treatments These may include:  Surgery to open blocked coronary arteries or repair damaged heart valves.  Placement of a biventricular pacemaker to improve heart muscle function (cardiac resynchronization therapy). This device paces both the right ventricle and left ventricle.  Placement of a device to treat serious abnormal heart rhythms (implantable cardioverter defibrillator, or ICD).  Placement of a device to improve the pumping ability of the heart (left ventricular assist device, or LVAD).  Heart transplant. This can cure heart failure, and it is considered for certain patients who do not improve with other therapies.  Follow these instructions at home: Medicines  Take over-the-counter and prescription medicines only as told by your health care provider. Medicines are important in reducing the workload of your heart, slowing the progression of heart failure, and improving your symptoms. ? Do not stop taking your medicine unless your health care provider told you to do that. ? Do not skip any dose of medicine. ? Refill your prescriptions before you run out of medicine. You need your medicines every day. Eating and  drinking   Eat heart-healthy foods. Talk with a dietitian to make an eating plan that is right for you. ? Choose foods that contain no trans fat and are low in saturated fat and cholesterol. Healthy choices include fresh or frozen fruits and vegetables, fish, lean meats, legumes, fat-free or low-fat dairy products, and whole-grain or high-fiber foods. ? Limit salt (sodium) if directed by your health care provider. Sodium restriction may reduce symptoms of heart failure. Ask a dietitian to recommend heart-healthy seasonings. ? Use healthy cooking methods instead of frying. Healthy methods include roasting, grilling, broiling, baking, poaching, steaming, and stir-frying.  Limit your fluid intake if directed by your health care provider.  Fluid restriction may reduce symptoms of heart failure. Lifestyle  Stop smoking or using chewing tobacco. Nicotine and tobacco can damage your heart and your blood vessels. Do not use nicotine gum or patches before talking to your health care provider.  Limit alcohol intake to no more than 1 drink per day for non-pregnant women and 2 drinks per day for men. One drink equals 12 oz of beer, 5 oz of wine, or 1 oz of hard liquor. ? Drinking more than that is harmful to your heart. Tell your health care provider if you drink alcohol several times a week. ? Talk with your health care provider about whether any level of alcohol use is safe for you. ? If your heart has already been damaged by alcohol or you have severe heart failure, drinking alcohol should be stopped completely.  Stop use of illegal drugs.  Lose weight if directed by your health care provider. Weight loss may reduce symptoms of heart failure.  Do moderate physical activity if directed by your health care provider. People who are elderly and people with severe heart failure should consult with a health care provider for physical activity recommendations. Monitor important information  Weigh yourself every day. Keeping track of your weight daily helps you to notice excess fluid sooner. ? Weigh yourself every morning after you urinate and before you eat breakfast. ? Wear the same amount of clothing each time you weigh yourself. ? Record your daily weight. Provide your health care provider with your weight record.  Monitor and record your blood pressure as told by your health care provider.  Check your pulse as told by your health care provider. Dealing with extreme temperatures  If the weather is extremely hot: ? Avoid vigorous physical activity. ? Use air conditioning or fans or seek a cooler location. ? Avoid caffeine and alcohol. ? Wear loose-fitting, lightweight, and light-colored clothing.  If the weather is extremely  cold: ? Avoid vigorous physical activity. ? Layer your clothes. ? Wear mittens or gloves, a hat, and a scarf when you go outside. ? Avoid alcohol. General instructions  Manage other health conditions such as hypertension, diabetes, thyroid disease, or abnormal heart rhythms as told by your health care provider.  Learn to manage stress. If you need help to do this, ask your health care provider.  Plan rest periods when fatigued.  Get ongoing education and support as needed.  Participate in or seek rehabilitation as needed to maintain or improve independence and quality of life.  Stay up to date with immunizations. Keeping current on pneumococcal and influenza immunizations is especially important to prevent respiratory infections.  Keep all follow-up visits as told by your health care provider. This is important. Contact a health care provider if:  You have a rapid weight gain.  You have increasing  shortness of breath that is unusual for you.  You are unable to participate in your usual physical activities.  You tire easily.  You cough more than normal, especially with physical activity.  You have any swelling or more swelling in areas such as your hands, feet, ankles, or abdomen.  You are unable to sleep because it is hard to breathe.  You feel like your heart is beating quickly (palpitations).  You become dizzy or light-headed when you stand up. Get help right away if:  You have difficulty breathing.  You notice or your family notices a change in your awareness, such as having trouble staying awake or having difficulty with concentration.  You have pain or discomfort in your chest.  You have an episode of fainting (syncope). This information is not intended to replace advice given to you by your health care provider. Make sure you discuss any questions you have with your health care provider. Document Released: 11/12/2005 Document Revised: 07/17/2016 Document  Reviewed: 06/06/2016 Elsevier Interactive Patient Education  2018 [employer] on my medicine - Coumadin   (Warfarin)  This medication education was reviewed with me or my healthcare representative as part of my discharge preparation.  The pharmacist that spoke with me during my hospital stay was:  Rush Barer, [facility]  Why was Coumadin prescribed for you? Coumadin was prescribed for you because you have a blood clot or a medical condition that can cause an increased risk of forming blood clots. Blood clots can cause serious health problems by blocking the flow of blood to the heart, lung, or brain. Coumadin can prevent harmful blood clots from forming. As a reminder your indication for Coumadin is:   Stroke Prevention Because Of Atrial Fibrillation  What test will check on my response to Coumadin? While on Coumadin (warfarin) you will need to have an INR test regularly to ensure that your dose is keeping you in the desired range. The INR (international normalized ratio) number is calculated from the result of the laboratory test called prothrombin time (PT).  If an INR APPOINTMENT HAS NOT ALREADY BEEN MADE FOR YOU please schedule an appointment to have this lab work done by your health care provider within 7 days. Your INR goal is usually a number between:  2 to 3 or your provider may give you a more narrow range like 2-2.5.  Ask your health care provider during an office visit what your goal INR is.  What  do you need to  know  About  COUMADIN? Take Coumadin (warfarin) exactly as prescribed by your healthcare provider about the same time each day.  DO NOT stop taking without talking to the doctor who prescribed the medication.  Stopping without other blood clot prevention medication to take the place of Coumadin may increase your risk of developing a new clot or stroke.  Get refills before you run out.  What do you do if you miss a dose? If you miss a dose, take it as soon as  you remember on the same day then continue your regularly scheduled regimen the next day.  Do not take two doses of Coumadin at the same time.  Important Safety Information A possible side effect of Coumadin (Warfarin) is an increased risk of bleeding. You should call your healthcare provider right away if you experience any of the following: ? Bleeding from an injury or your nose that does not stop. ? Unusual colored urine (red or dark brown) or unusual colored  stools (red or black). ? Unusual bruising for unknown reasons. ? A serious fall or if you hit your head (even if there is no bleeding).  Some foods or medicines interact with Coumadin (warfarin) and might alter your response to warfarin. To help avoid this: ? Eat a balanced diet, maintaining a consistent amount of Vitamin K. ? Notify your provider about major diet changes you plan to make. ? Avoid alcohol or limit your intake to 1 drink for women and 2 drinks for men per day. (1 drink is 5 oz. wine, 12 oz. beer, or 1.5 oz. liquor.)  Make sure that ANY health care provider who prescribes medication for you knows that you are taking Coumadin (warfarin).  Also make sure the healthcare provider who is monitoring your Coumadin knows when you have started a new medication including herbals and non-prescription products.  Coumadin (Warfarin)  Major Drug Interactions  Increased Warfarin Effect Decreased Warfarin Effect  Alcohol (large quantities) Antibiotics (esp. Septra/Bactrim, Flagyl, Cipro) Amiodarone (Cordarone) Aspirin (ASA) Cimetidine (Tagamet) Megestrol (Megace) NSAIDs (ibuprofen, naproxen, etc.) Piroxicam (Feldene) Propafenone (Rythmol SR) Propranolol (Inderal) Isoniazid (INH) Posaconazole (Noxafil) Barbiturates (Phenobarbital) Carbamazepine (Tegretol) Chlordiazepoxide (Librium) Cholestyramine (Questran) Griseofulvin Oral Contraceptives Rifampin Sucralfate (Carafate) Vitamin K   Coumadin (Warfarin) Major Herbal  Interactions  Increased Warfarin Effect Decreased Warfarin Effect  Garlic Ginseng Ginkgo biloba Coenzyme Q10 Green tea [place] Johns wort    Coumadin (Warfarin) FOOD Interactions  Eat a consistent number of servings per week of foods HIGH in Vitamin K (1 serving =  cup)  Collards (cooked, or boiled & drained) Kale (cooked, or boiled & drained) Mustard greens (cooked, or boiled & drained) Parsley *serving size only =  cup Spinach (cooked, or boiled & drained) Swiss chard (cooked, or boiled & drained) Turnip greens (cooked, or boiled & drained)  Eat a consistent number of servings per week of foods MEDIUM-HIGH in Vitamin K (1 serving = 1 cup)  Asparagus (cooked, or boiled & drained) Broccoli (cooked, boiled & drained, or raw & chopped) Brussel sprouts (cooked, or boiled & drained) *serving size only =  cup Lettuce, raw (green leaf, endive, romaine) Spinach, raw Turnip greens, raw & chopped   These websites have more information on Coumadin (warfarin):  FailFactory.se; VeganReport.com.au;

## 2018-06-11 NOTE — Progress Notes (Signed)
Subjective:   patient denies any chest pain or shortness of breath. Tolerated right and left heart cath and TEE yesterday noted to have left atrial appendage small thrombus. Remains on heparin  Objective:  Vital Signs in the last 24 hours: Temp:  [98 F (36.7 C)-98.4 F (36.9 C)] 98 F (36.7 C) (07/17 0453) Pulse Rate:  [46-84] 55 (07/17 0453) Resp:  [15-27] 18 (07/17 0453) BP: (77-121)/(37-95) 121/95 (07/17 0453) SpO2:  [93 %-99 %] 98 % (07/17 0453) Weight:  [139 kg (306 lb 6.4 oz)] 139 kg (306 lb 6.4 oz) (07/17 0453)  Intake/Output from previous day: 07/16 0701 - 07/17 0700 In: 1127.3 [P.O.:360; I.V.:767.3] Out: 950 [Urine:950] Intake/Output from this shift: Total I/O In: 360 [P.O.:360] Out: 200 [Urine:200]  Physical Exam: Neck: no adenopathy, no carotid bruit, no JVD and supple, symmetrical, trachea midline Lungs: clear to auscultation bilaterally Heart: irregularly irregular rhythm, S1, S2 normal and 2/6 systolic and diastolic murmur noted Abdomen: soft, non-tender; bowel sounds normal; no masses,  no organomegaly Extremities: extremities normal, atraumatic, no cyanosis or edema and Right groin stable  Lab Results: Recent Labs    06/10/18 0713 06/11/18 0027  WBC 9.7 9.5  HGB 17.2* 16.0  PLT 253 245   Recent Labs    06/10/18 0713  NA 139  K 4.8  CL 106  CO2 23  GLUCOSE 125*  BUN 22  CREATININE 1.31*   No results for input(s): TROPONINI in the last 72 hours.  Invalid input(s): CK, MB Hepatic Function Panel No results for input(s): PROT, ALBUMIN, AST, ALT, ALKPHOS, BILITOT, BILIDIR, IBILI in the last 72 hours. No results for input(s): CHOL in the last 72 hours. No results for input(s): PROTIME in the last 72 hours.  Imaging: Imaging results have been reviewed and No results found.  Cardiac Studies:  Assessment/Plan:  Severe mitral stenosis./moderate MR Left atrial appendage thrombus Pulmonary hypertension. Questionable rheumatic heart  disease Coronary artery disease, history of inferior wall MI in the past, status post multivessel PCI in the past. Chronic atrial fibrillation. Compensated systolic congestive heart failure. Hypertension. COPD Morbid obesity. Obstructive sleep apnea. Hyperlipidemia. History of kidney stones. Plan Start Coumadin per pharmacy protocol Will DC home once INR above 2   LOS: 1 day    [name] 06/11/2018, 11:56 AM

## 2018-06-11 NOTE — Progress Notes (Signed)
[place] for Heparin  Indication: S/P cath awaiting CVTS consult  Allergies  Allergen Reactions  . Ramipril     cough    Patient Measurements: Height: 5\' 11"  (180.3 cm) Weight: (!) 304 lb (137.9 kg) IBW/kg (Calculated) : 75.3 Heparin Dosing Weight: 107 kg  Vital Signs: Temp: 98.4 F (36.9 C) (07/16 2012) Temp Source: Oral (07/16 2012) BP: 99/64 (07/16 2012) Pulse Rate: 58 (07/16 2012)  Labs: Recent Labs    06/10/18 0713 06/11/18 0027  HGB 17.2* 16.0  HCT 55.3* 52.0  PLT 253 245  LABPROT 15.6*  --   INR 1.25  --   HEPARINUNFRC  --  0.17*  CREATININE 1.31*  --     Estimated Creatinine Clearance: 84 mL/min (A) (by C-G formula based on SCr of 1.31 mg/dL (H)).   Medical History: Past Medical History:  Diagnosis Date  . Afib ([place])   . COPD with chronic bronchitis ([place]) 09/08/2014  . Coronary artery disease   . History of pneumonia    RML on CXR 07/20/14  . Hyperlipidemia   . Myocardial infarction ([place])   . Renal disorder    history kidney stone   Assessment: 7 YOM with history of Afib on Coumadin PTA, last dose on 06/05/18.  Patient underwent cath today and found to have severe MV calcification requiring CVTS evaluation.  Pharmacy consulted to initiate IV heparin 8 hours post cath.  Sheath removed around 1000 today.  No bleeding/hematoma documented.  7/17 AM update: heparin level sub-therapeutic s/p cath, awaiting CVTS consult  Goal of Therapy:  Heparin level 0.3-0.7 units/ml Monitor platelets by anticoagulation protocol: Yes   Plan:  Inc heparin to 1700 units/hr 1000 HL  Narda Bonds, PharmD, BCPS Clinical Pharmacist Phone: [phone]

## 2018-06-11 NOTE — Telephone Encounter (Signed)
[facility] received the referral from Dr. [name] for severe mitral stenosis, moderate MR, eval for MVR/MAZE surgery.  This patient would possibly benefit from a Mini MVR/MAZE so this will be give to Dr. Roxy Manns.  He is currently out this week so an outpatient appt has been scheduled for Wednesday 7/24 @ 3pm at our office.  Dr. Roxy Manns and Dr. [name] have been made aware of the plan.  Per Dr. Roxy Manns if his kidneys stabilize, we will order a CTA aortic dissection protocol while inpatient.  If it cannot get done before he goes home, we will order as an outpatient.

## 2018-06-11 NOTE — Progress Notes (Addendum)
[place] for Heparin  Indication: S/P cath awaiting CVTS consult  Allergies  Allergen Reactions  . Ramipril     cough    Patient Measurements: Height: 5\' 11"  (180.3 cm) Weight: (!) 306 lb 6.4 oz (139 kg)(scale c) IBW/kg (Calculated) : 75.3 Heparin Dosing Weight: 107 kg  Vital Signs: Temp: 98 F (36.7 C) (07/17 0453) Temp Source: Oral (07/17 0453) BP: 121/95 (07/17 0453) Pulse Rate: 55 (07/17 0453)  Labs: Recent Labs    06/10/18 0713 06/11/18 0027 06/11/18 0510 06/11/18 0907  HGB 17.2* 16.0  --   --   HCT 55.3* 52.0  --   --   PLT 253 245  --   --   LABPROT 15.6*  --   --   --   INR 1.25  --   --   --   HEPARINUNFRC  --  0.17* 0.28* 0.34  CREATININE 1.31*  --   --   --     Estimated Creatinine Clearance: 84.4 mL/min (A) (by C-G formula based on SCr of 1.31 mg/dL (H)).   Medical History: Past Medical History:  Diagnosis Date  . Afib ([place])   . COPD with chronic bronchitis ([place]) 09/08/2014  . Coronary artery disease   . History of pneumonia    RML on CXR 07/20/14  . Hyperlipidemia   . Myocardial infarction ([place])   . Renal disorder    history kidney stone   Assessment: 13 YOM with history of Afib on Coumadin PTA, last dose on 06/05/18.  Patient underwent cath 7/16 and found to have severe MV calcification requiring CVTS evaluation.  IV heparin resumed post cath.  No bleeding/hematoma documented.  Heparin level therapeutic at 0.34.  Goal of Therapy:  Heparin level 0.3-0.7 units/ml Monitor platelets by anticoagulation protocol: Yes   Plan:  Increase heparin to 1800 units/hr Monitor for s/sx of bleeding/hematoma F/U with resuming home meds   Harrietta Guardian, PharmD PGY1 Pharmacy Resident 06/11/2018    11:10 AM  Addendum: Billy Fischer Coumadin 6mg  and check daily INRs (goal 2-3). Home regimen 4mg  daily except 2mg  Mon.Sat.Boone Master, PharmD PGY1 Pharmacy Resident 06/11/2018    12:42 PM

## 2018-06-12 ENCOUNTER — Inpatient Hospital Stay (HOSPITAL_COMMUNITY): Payer: BLUE CROSS/BLUE SHIELD

## 2018-06-12 DIAGNOSIS — Z01810 Encounter for preprocedural cardiovascular examination: Secondary | ICD-10-CM

## 2018-06-12 LAB — BASIC METABOLIC PANEL
ANION GAP: 10 (ref 5–15)
BUN: 15 mg/dL (ref 8–23)
CO2: 23 mmol/L (ref 22–32)
Calcium: 9 mg/dL (ref 8.9–10.3)
Chloride: 106 mmol/L (ref 98–111)
Creatinine, Ser: 1.21 mg/dL (ref 0.61–1.24)
GFR calc Af Amer: >60 mL/min (ref >60)
GLUCOSE: 104 mg/dL — AB (ref 70–99)
Potassium: 4.3 mmol/L (ref 3.5–5.1)
Sodium: 139 mmol/L (ref 135–145)

## 2018-06-12 LAB — CBC
HEMATOCRIT: 50.8 % (ref 39.0–52.0)
Hemoglobin: 16 g/dL (ref 13.0–17.0)
MCH: 27.7 pg (ref 26.0–34.0)
MCHC: 31.5 g/dL (ref 30.0–36.0)
MCV: 87.9 fL (ref 78.0–100.0)
Platelets: 207 10*3/uL (ref 150–400)
RBC: 5.78 MIL/uL (ref 4.22–5.81)
RDW: 17 % — AB (ref 11.5–15.5)
WBC: 7.8 10*3/uL (ref 4.0–10.5)

## 2018-06-12 LAB — PROTIME-INR
INR: 1.28
Prothrombin Time: 15.9 seconds — ABNORMAL HIGH (ref 11.4–15.2)

## 2018-06-12 LAB — HEPARIN LEVEL (UNFRACTIONATED): HEPARIN UNFRACTIONATED: 0.55 [IU]/mL (ref 0.30–0.70)

## 2018-06-12 MED ORDER — WARFARIN SODIUM 3 MG PO TABS
6.0000 mg | ORAL_TABLET | Freq: Once | ORAL | Status: AC
  Administered 2018-06-12: 6 mg via ORAL
  Filled 2018-06-12: qty 2

## 2018-06-12 MED ORDER — FUROSEMIDE 10 MG/ML IJ SOLN
40.0000 mg | Freq: Once | INTRAMUSCULAR | Status: AC
  Administered 2018-06-12: 40 mg via INTRAVENOUS
  Filled 2018-06-12: qty 4

## 2018-06-12 NOTE — Progress Notes (Signed)
[facility] for Heparin  Indication: S/P cath awaiting CVTS consult  Allergies  Allergen Reactions  . Ramipril     cough    Patient Measurements: Height: 5\' 11"  (180.3 cm) Weight: (!) 306 lb 4.8 oz (138.9 kg) IBW/kg (Calculated) : 75.3 Heparin Dosing Weight: 107 kg  Vital Signs: Temp: 98.1 F (36.7 C) (07/18 0918) Temp Source: Oral (07/18 0918) BP: 104/69 (07/18 0918) Pulse Rate: 66 (07/18 0918)  Labs: Recent Labs    06/10/18 0713  06/11/18 0027 06/11/18 0510 06/11/18 0907 06/12/18 0419  HGB 17.2*  --  16.0  --   --  16.0  HCT 55.3*  --  52.0  --   --  50.8  PLT 253  --  245  --   --  207  LABPROT 15.6*  --   --   --   --  15.9*  INR 1.25  --   --   --   --  1.28  HEPARINUNFRC  --    < > 0.17* 0.28* 0.34 0.55  CREATININE 1.31*  --   --   --   --  1.21   < > = values in this interval not displayed.    Estimated Creatinine Clearance: 91.3 mL/min (by C-G formula based on SCr of 1.21 mg/dL).   Medical History: Past Medical History:  Diagnosis Date  . Afib ([place])   . COPD with chronic bronchitis ([place]) 09/08/2014  . Coronary artery disease   . History of pneumonia    RML on CXR 07/20/14  . Hyperlipidemia   . Myocardial infarction ([place])   . Renal disorder    history kidney stone   Assessment: 6 YOM with history of Afib on Coumadin PTA, last dose on 06/05/18.  Patient underwent cath 7/16 and found to have severe MV calcification requiring CVTS evaluation.  IV heparin resumed post cath, coumadin resumed 7/17. TEE on 7/17 showed thrombus in atrial appendage.    Heparin level therapeutic at 0.55, INR subtherapeutic at 1.28. No bleeding/hematoma documented.   Home regimen 4mg  daily except 2mg  Mon.Sat.Sun.  Goal of Therapy:  Heparin level 0.3-0.7 units/ml Monitor platelets by anticoagulation protocol: Yes   Plan:  heparin infusion 1800  units/hr coumadin 6 mg po daily Monitor for s/sx of bleeding/hematoma F/U with resuming  home meds   Harrietta Guardian, PharmD PGY1 Pharmacy Resident 06/12/2018    10:28 AM

## 2018-06-12 NOTE — Progress Notes (Signed)
Pre-op Cardiac Surgery  Carotid Findings:  Completed 10/2017. Results can be found in [facility].  Upper Extremity Right Left  Brachial Pressures Triphasic- pressure not obtained due to IV location 98-Triphasic  Radial Waveforms Triphasic Triphasic  Ulnar Waveforms Triphasic Triphasic  Palmar Arch (Allen's Test) Signal is unaffected with radial compression, decreases >50% with ulnar compression. Signal obliterates with radial compression, is unaffected with ulnar compression.   06/12/2018 10:30 AM Maudry Mayhew, BS, RVT, RDCS, RDMS

## 2018-06-12 NOTE — Progress Notes (Signed)
Subjective:  Doing well denies any chest pain or shortness of breath. INR remains subtherapeutic.  Objective:  Vital Signs in the last 24 hours: Temp:  [98.1 F (36.7 C)-98.7 F (37.1 C)] 98.3 F (36.8 C) (07/18 1126) Pulse Rate:  [52-85] 65 (07/18 1126) Resp:  [16-20] 20 (07/18 1126) BP: (98-120)/(68-83) 98/75 (07/18 1126) SpO2:  [90 %-98 %] 98 % (07/18 1126) Weight:  [138.9 kg (306 lb 4.8 oz)] 138.9 kg (306 lb 4.8 oz) (07/18 0300)  Intake/Output from previous day: 07/17 0701 - 07/18 0700 In: 840 [P.O.:840] Out: 1050 [Urine:1050] Intake/Output from this shift: Total I/O In: 1047.8 [P.O.:530; I.V.:517.8] Out: 475 [Urine:475]  Physical Exam: Neck: no adenopathy, no carotid bruit, no JVD and supple, symmetrical, trachea midline Lungs: clear to auscultation bilaterally Heart: irregularly irregular rhythm, S1, S2 normal and 2/6 systolic and diastolic murmur noted Abdomen: soft, non-tender; bowel sounds normal; no masses,  no organomegaly Extremities: No clubbing cyanosis 2+ edema noted  Lab Results: Recent Labs    06/11/18 0027 06/12/18 0419  WBC 9.5 7.8  HGB 16.0 16.0  PLT 245 207   Recent Labs    06/10/18 0713 06/12/18 0419  NA 139 139  K 4.8 4.3  CL 106 106  CO2 23 23  GLUCOSE 125* 104*  BUN 22 15  CREATININE 1.31* 1.21   No results for input(s): TROPONINI in the last 72 hours.  Invalid input(s): CK, MB Hepatic Function Panel No results for input(s): PROT, ALBUMIN, AST, ALT, ALKPHOS, BILITOT, BILIDIR, IBILI in the last 72 hours. No results for input(s): CHOL in the last 72 hours. No results for input(s): PROTIME in the last 72 hours.  Imaging: Imaging results have been reviewed and No results found.  Cardiac Studies:  Assessment/Plan:  Severe mitral stenosis./moderate MR Left atrial appendage thrombus Pulmonary hypertension. Questionable rheumatic heart disease Coronary artery disease, history of inferior wall MI in the past, status post  multivessel PCI in the past. Chronic atrial fibrillation. Compensated systolic congestive heart failure. Hypertension. COPD Morbid obesity. Obstructive sleep apnea. Hyperlipidemia. History of kidney stones. Plan Continue present management Lasix 40 mg IV 1 Coumadin per pharmacy   LOS: 2 days    [name] 06/12/2018, 11:44 AM

## 2018-06-13 ENCOUNTER — Inpatient Hospital Stay (HOSPITAL_COMMUNITY): Payer: BLUE CROSS/BLUE SHIELD

## 2018-06-13 ENCOUNTER — Encounter (HOSPITAL_COMMUNITY): Payer: Self-pay | Admitting: Radiology

## 2018-06-13 LAB — PULMONARY FUNCTION TEST
DL/VA % pred: 90 %
DL/VA: 4.25 ml/min/mmHg/L
DLCO COR % PRED: 66 %
DLCO COR: 22.53 ml/min/mmHg
DLCO UNC: 23.37 ml/min/mmHg
DLCO unc % pred: 69 %
FEF 25-75 POST: 1.16 L/s
FEF 25-75 Pre: 1.19 L/sec
FEF2575-%Change-Post: -2 %
FEF2575-%PRED-PRE: 39 %
FEF2575-%Pred-Post: 38 %
FEV1-%Change-Post: 2 %
FEV1-%PRED-POST: 55 %
FEV1-%PRED-PRE: 53 %
FEV1-Post: 2.03 L
FEV1-Pre: 1.98 L
FEV1FVC-%Change-Post: 0 %
FEV1FVC-%PRED-PRE: 84 %
FEV6-%Change-Post: 3 %
FEV6-%PRED-POST: 68 %
FEV6-%Pred-Pre: 66 %
FEV6-POST: 3.18 L
FEV6-Pre: 3.08 L
FEV6FVC-%Change-Post: 0 %
FEV6FVC-%PRED-POST: 104 %
FEV6FVC-%Pred-Pre: 104 %
FVC-%CHANGE-POST: 2 %
FVC-%Pred-Post: 65 %
FVC-%Pred-Pre: 63 %
FVC-Post: 3.21 L
FVC-Pre: 3.12 L
POST FEV6/FVC RATIO: 99 %
PRE FEV1/FVC RATIO: 63 %
Post FEV1/FVC ratio: 63 %
Pre FEV6/FVC Ratio: 99 %
RV % pred: 166 %
RV: 3.88 L
TLC % pred: 102 %
TLC: 7.41 L

## 2018-06-13 LAB — CBC
HEMATOCRIT: 49.5 % (ref 39.0–52.0)
HEMOGLOBIN: 15.5 g/dL (ref 13.0–17.0)
MCH: 27.4 pg (ref 26.0–34.0)
MCHC: 31.3 g/dL (ref 30.0–36.0)
MCV: 87.5 fL (ref 78.0–100.0)
Platelets: 193 10*3/uL (ref 150–400)
RBC: 5.66 MIL/uL (ref 4.22–5.81)
RDW: 17 % — ABNORMAL HIGH (ref 11.5–15.5)
WBC: 7.4 10*3/uL (ref 4.0–10.5)

## 2018-06-13 LAB — HEPARIN LEVEL (UNFRACTIONATED): Heparin Unfractionated: 0.44 IU/mL (ref 0.30–0.70)

## 2018-06-13 LAB — PROTIME-INR
INR: 1.28
Prothrombin Time: 15.9 seconds — ABNORMAL HIGH (ref 11.4–15.2)

## 2018-06-13 MED ORDER — DIGOXIN 125 MCG PO TABS
0.2500 mg | ORAL_TABLET | Freq: Every day | ORAL | Status: DC
  Administered 2018-06-13 – 2018-06-17 (×5): 0.25 mg via ORAL
  Filled 2018-06-13 (×5): qty 2

## 2018-06-13 MED ORDER — FUROSEMIDE 10 MG/ML IJ SOLN
40.0000 mg | Freq: Once | INTRAMUSCULAR | Status: AC
  Administered 2018-06-13: 40 mg via INTRAVENOUS
  Filled 2018-06-13: qty 4

## 2018-06-13 MED ORDER — IOPAMIDOL (ISOVUE-370) INJECTION 76%
100.0000 mL | Freq: Once | INTRAVENOUS | Status: AC | PRN
  Administered 2018-06-13: 100 mL via INTRAVENOUS

## 2018-06-13 MED ORDER — METOPROLOL TARTRATE 25 MG PO TABS
25.0000 mg | ORAL_TABLET | Freq: Two times a day (BID) | ORAL | Status: DC
  Administered 2018-06-13 – 2018-06-17 (×8): 25 mg via ORAL
  Filled 2018-06-13 (×9): qty 1

## 2018-06-13 MED ORDER — WARFARIN SODIUM 7.5 MG PO TABS
7.5000 mg | ORAL_TABLET | Freq: Once | ORAL | Status: AC
  Administered 2018-06-13: 7.5 mg via ORAL
  Filled 2018-06-13: qty 1

## 2018-06-13 MED ORDER — ALBUTEROL SULFATE (2.5 MG/3ML) 0.083% IN NEBU
2.5000 mg | INHALATION_SOLUTION | Freq: Once | RESPIRATORY_TRACT | Status: AC
  Administered 2018-06-13: 2.5 mg via RESPIRATORY_TRACT

## 2018-06-13 NOTE — Progress Notes (Signed)
Subjective:  Patient denies any chest pain or shortness of breath. Denies palpitation, lightheadedness or syncope. Leg swelling slightly improved  Objective:  Vital Signs in the last 24 hours: Temp:  [97.9 F (36.6 C)-98.3 F (36.8 C)] 98.2 F (36.8 C) (07/19 0919) Pulse Rate:  [72-120] 120 (07/19 0919) Resp:  [18-20] 20 (07/19 0919) BP: (94-117)/(69-80) 96/73 (07/19 0919) SpO2:  [93 %-98 %] 96 % (07/19 0919) Weight:  [137.5 kg (303 lb 1.6 oz)] 137.5 kg (303 lb 1.6 oz) (07/19 0524)  Intake/Output from previous day: 07/18 0701 - 07/19 0700 In: 1881.2 [P.O.:1010; I.V.:871.2] Out: 2125 [Urine:2125] Intake/Output from this shift: Total I/O In: 240 [P.O.:240] Out: 150 [Urine:150]  Physical Exam: Neck: no adenopathy, no carotid bruit, no JVD and supple, symmetrical, trachea midline Lungs: clear to auscultation bilaterally Heart: irregularly irregular rhythm, S1, S2 normal and 2/6 systolic and diastolic murmur noted Abdomen: soft, non-tender; bowel sounds normal; no masses,  no organomegaly Extremities: No clubbing cyanosis 2+ edema noted  Lab Results: Recent Labs    06/12/18 0419 06/13/18 0538  WBC 7.8 7.4  HGB 16.0 15.5  PLT 207 193   Recent Labs    06/12/18 0419  NA 139  K 4.3  CL 106  CO2 23  GLUCOSE 104*  BUN 15  CREATININE 1.21   No results for input(s): TROPONINI in the last 72 hours.  Invalid input(s): CK, MB Hepatic Function Panel No results for input(s): PROT, ALBUMIN, AST, ALT, ALKPHOS, BILITOT, BILIDIR, IBILI in the last 72 hours. No results for input(s): CHOL in the last 72 hours. No results for input(s): PROTIME in the last 72 hours.  Imaging: Imaging results have been reviewed and No results found.  Cardiac Studies:  Assessment/Plan:  Severe mitral stenosis./moderate MR Left atrial appendage thrombus Pulmonary hypertension. Questionable rheumatic heart disease Coronary artery disease, history of inferior wall MI in the past, status post  multivessel PCI in the past. Chronic atrial fibrillation. Compensated systolic congestive heart failure. Hypertension. COPD Morbid obesity. Obstructive sleep apnea. Hyperlipidemia. History of kidney stones. Plan As per orders Scheduled for CT of the chest in preparation for MVR/Maze procedure   LOS: 3 days    [name] 06/13/2018, 11:52 AM

## 2018-06-13 NOTE — Care Management Note (Signed)
Case Management Note  Patient Details  Name: Jeffrey Larson MRN: [id number] Date of Birth: [birth year]  Subjective/Objective:                  MV Stenosis  Action/Plan: CM following for progression of care; for MVR/Maze procedure  Expected Discharge Date:   undetermined at this time               Expected Discharge Plan:   Home  Status of Service:   In progress  Sherrilyn Rist [phone] 06/13/2018, 3:48 PM

## 2018-06-13 NOTE — Progress Notes (Signed)
[facility] for Heparin and Warfarin  Indication: Atrial Fibrillation and Left Atrial Appendage Thrombus  Allergies  Allergen Reactions  . Ramipril     cough    Patient Measurements: Height: 5\' 11"  (180.3 cm) Weight: (!) 303 lb 1.6 oz (137.5 kg)(scale C) IBW/kg (Calculated) : 75.3 Heparin Dosing Weight: 107 kg  Vital Signs: Temp: 98.2 F (36.8 C) (07/19 0919) Temp Source: Oral (07/19 0919) BP: 96/73 (07/19 0919) Pulse Rate: 120 (07/19 0919)  Labs: Recent Labs    06/11/18 0027  06/11/18 0907 06/12/18 0419 06/13/18 0538 06/13/18 0924  HGB 16.0  --   --  16.0 15.5  --   HCT 52.0  --   --  50.8 49.5  --   PLT 245  --   --  207 193  --   LABPROT  --   --   --  15.9*  --  15.9*  INR  --   --   --  1.28  --  1.28  HEPARINUNFRC 0.17*   < > 0.34 0.55  --  0.44  CREATININE  --   --   --  1.21  --   --    < > = values in this interval not displayed.    Estimated Creatinine Clearance: 90.9 mL/min (by C-G formula based on SCr of 1.21 mg/dL).   Medical History: Past Medical History:  Diagnosis Date  . Afib ([place])   . COPD with chronic bronchitis ([place]) 09/08/2014  . Coronary artery disease   . History of pneumonia    RML on CXR 07/20/14  . Hyperlipidemia   . Myocardial infarction ([place])   . Renal disorder    history kidney stone   Assessment: 66 YOM with history of Afib on Coumadin PTA, last dose on 06/05/18 with admission INR of 1.25. Patient underwent cath 7/16 and found to have severe MV calcification requiring CVTS evaluation. IV heparin resumed post cath, coumadin resumed 7/17. TEE on 7/17 showed thrombus in atrial appendage.    Heparin level therapeutic at 0.44, INR subtherapeutic at 1.28 after two doses of coumadin 6 mg. No bleeding/hematoma documented.   Home coumadin regimen 4mg  daily except 2mg  Mon.Sat.Sun.  Goal of Therapy:  Heparin level 0.3-0.7 units/ml INR 2-3 Monitor platelets by anticoagulation protocol: Yes    Plan:  Continue heparin infusion 1800 units/hr Coumadin 7.5 mg daily Monitor for s/sx of bleeding/hematoma   Jackson Latino, PharmD PGY1 Pharmacy Resident Phone [phone] 06/13/2018     10:16 AM

## 2018-06-14 LAB — BASIC METABOLIC PANEL
Anion gap: 9 (ref 5–15)
BUN: 15 mg/dL (ref 8–23)
CHLORIDE: 107 mmol/L (ref 98–111)
CO2: 24 mmol/L (ref 22–32)
CREATININE: 1.25 mg/dL — AB (ref 0.61–1.24)
Calcium: 9.2 mg/dL (ref 8.9–10.3)
GFR calc Af Amer: >60 mL/min (ref >60)
GFR calc non Af Amer: >60 mL/min (ref >60)
GLUCOSE: 110 mg/dL — AB (ref 70–99)
POTASSIUM: 4.1 mmol/L (ref 3.5–5.1)
SODIUM: 140 mmol/L (ref 135–145)

## 2018-06-14 LAB — CBC
HCT: 50.5 % (ref 39.0–52.0)
Hemoglobin: 15.9 g/dL (ref 13.0–17.0)
MCH: 27.6 pg (ref 26.0–34.0)
MCHC: 31.5 g/dL (ref 30.0–36.0)
MCV: 87.5 fL (ref 78.0–100.0)
PLATELETS: 185 10*3/uL (ref 150–400)
RBC: 5.77 MIL/uL (ref 4.22–5.81)
RDW: 17.3 % — AB (ref 11.5–15.5)
WBC: 6.9 10*3/uL (ref 4.0–10.5)

## 2018-06-14 LAB — HEPARIN LEVEL (UNFRACTIONATED): HEPARIN UNFRACTIONATED: 0.32 [IU]/mL (ref 0.30–0.70)

## 2018-06-14 LAB — PROTIME-INR
INR: 1.37
Prothrombin Time: 16.7 seconds — ABNORMAL HIGH (ref 11.4–15.2)

## 2018-06-14 MED ORDER — WARFARIN SODIUM 7.5 MG PO TABS
7.5000 mg | ORAL_TABLET | Freq: Once | ORAL | Status: AC
  Administered 2018-06-14: 7.5 mg via ORAL
  Filled 2018-06-14: qty 1

## 2018-06-14 MED ORDER — FUROSEMIDE 10 MG/ML IJ SOLN
40.0000 mg | Freq: Two times a day (BID) | INTRAMUSCULAR | Status: DC
  Administered 2018-06-14 – 2018-06-16 (×5): 40 mg via INTRAVENOUS
  Filled 2018-06-14 (×5): qty 4

## 2018-06-14 MED ORDER — LORATADINE 10 MG PO TABS
10.0000 mg | ORAL_TABLET | Freq: Every day | ORAL | Status: DC | PRN
  Administered 2018-06-14: 10 mg via ORAL
  Filled 2018-06-14: qty 1

## 2018-06-14 MED ORDER — LEVALBUTEROL HCL 1.25 MG/0.5ML IN NEBU
1.2500 mg | INHALATION_SOLUTION | Freq: Three times a day (TID) | RESPIRATORY_TRACT | Status: DC
  Administered 2018-06-14 – 2018-06-16 (×8): 1.25 mg via RESPIRATORY_TRACT
  Filled 2018-06-14 (×8): qty 0.5

## 2018-06-14 MED ORDER — GUAIFENESIN-DM 100-10 MG/5ML PO SYRP
5.0000 mL | ORAL_SOLUTION | ORAL | Status: DC | PRN
  Administered 2018-06-14 – 2018-06-16 (×5): 5 mL via ORAL
  Filled 2018-06-14 (×5): qty 5

## 2018-06-14 NOTE — Progress Notes (Signed)
CRITICAL VALUE ALERT  Critical Alert: 11 beats run of V-Tach  Noted at 5:38 am  Provider Notified: Yes, Cardiologist on call  Orders Received/Actions taken: Yes, Metoprolol 25 mg now, give early and romove 1000 dose

## 2018-06-14 NOTE — Progress Notes (Signed)
[facility] for Heparin and Warfarin  Indication: Atrial Fibrillation and Left Atrial Appendage Thrombus  Allergies  Allergen Reactions  . Ramipril     cough    Patient Measurements: Height: 5\' 11"  (180.3 cm) Weight: (!) 303 lb 8 oz (137.7 kg) IBW/kg (Calculated) : 75.3 Heparin Dosing Weight: 107 kg  Vital Signs: Temp: 97.9 F (36.6 C) (07/20 0400) Temp Source: Oral (07/20 0400) BP: 101/88 (07/20 0621) Pulse Rate: 84 (07/20 0621)  Labs: Recent Labs    06/12/18 0419 06/13/18 0538 06/13/18 0924 06/14/18 0611  HGB 16.0 15.5  --  15.9  HCT 50.8 49.5  --  50.5  PLT 207 193  --  185  LABPROT 15.9*  --  15.9* 16.7*  INR 1.28  --  1.28 1.37  HEPARINUNFRC 0.55  --  0.44 0.32  CREATININE 1.21  --   --  1.25*    Estimated Creatinine Clearance: 88 mL/min (A) (by C-G formula based on SCr of 1.25 mg/dL (H)).   Medical History: Past Medical History:  Diagnosis Date  . Afib ([place])   . COPD with chronic bronchitis ([place]) 09/08/2014  . Coronary artery disease   . History of pneumonia    RML on CXR 07/20/14  . Hyperlipidemia   . Myocardial infarction ([place])   . Renal disorder    history kidney stone   Assessment: 33 YOM with history of Afib on Coumadin PTA, last dose on 06/05/18 with admission INR of 1.25. Patient underwent cath 7/16 and found to have severe MV calcification requiring CVTS evaluation. IV heparin resumed post cath, coumadin resumed 7/17. TEE on 7/17 showed thrombus in atrial appendage.    Heparin level therapeutic at 0.32. INR increased, although still subtherapeutic, at 1.37. No bleeding/hematoma documented.   Home coumadin regimen 4mg  daily except 2mg  Mon.Sat.Sun.  Goal of Therapy:  Heparin level 0.3-0.7 units/ml INR 2-3 Monitor platelets by anticoagulation protocol: Yes   Plan:  Continue heparin infusion 1800 units/hr Coumadin 7.5 mg daily Monitor for s/sx of bleeding/hematoma   Jackson Latino, PharmD PGY1  Pharmacy Resident Phone [phone] 06/14/2018     8:19 AM

## 2018-06-14 NOTE — Progress Notes (Signed)
Subjective:  Complains of mild shortness of breath and chest congestion.  No chest pain.  No fever or chills.INR slowly trending up  Objective:  Vital Signs in the last 24 hours: Temp:  [97.7 F (36.5 C)-98.5 F (36.9 C)] 97.9 F (36.6 C) (07/20 0400) Pulse Rate:  [65-113] 86 (07/20 0916) Resp:  [20] 20 (07/19 1559) BP: (100-115)/(67-88) 101/88 (07/20 0621) SpO2:  [96 %-97 %] 96 % (07/19 1559) Weight:  [137.7 kg (303 lb 8 oz)] 137.7 kg (303 lb 8 oz) (07/20 0814)  Intake/Output from previous day: 07/19 0701 - 07/20 0700 In: 999.9 [P.O.:802; I.V.:197.9] Out: 1625 [Urine:1625] Intake/Output from this shift: Total I/O In: 238 [P.O.:238] Out: 650 [Urine:650]  Physical Exam: Neck: no adenopathy, no carotid bruit, no JVD and supple, symmetrical, trachea midline Lungs: decreased breath sound at bases with occasional expiratory wheezing Heart: irregularly irregular rhythm, S1, S2 normal and 2/6 systolic and diastolic murmur noted Abdomen: soft, non-tender; bowel sounds normal; no masses,  no organomegaly Extremities: no clubbing, cyanosis, 2+ edema noted  Lab Results: Recent Labs    06/13/18 0538 06/14/18 0611  WBC 7.4 6.9  HGB 15.5 15.9  PLT 193 185   Recent Labs    06/12/18 0419 06/14/18 0611  NA 139 140  K 4.3 4.1  CL 106 107  CO2 23 24  GLUCOSE 104* 110*  BUN 15 15  CREATININE 1.21 1.25*   No results for input(s): TROPONINI in the last 72 hours.  Invalid input(s): CK, MB Hepatic Function Panel No results for input(s): PROT, ALBUMIN, AST, ALT, ALKPHOS, BILITOT, BILIDIR, IBILI in the last 72 hours. No results for input(s): CHOL in the last 72 hours. No results for input(s): PROTIME in the last 72 hours.  Imaging: Imaging results have been reviewed and Ct Angio Chest/abd/pel For Dissection W And/or W/wo  Result Date: 06/13/2018 CLINICAL DATA:  Severe mitral stenosis/regurgitation. EXAM: CT ANGIOGRAPHY CHEST, ABDOMEN AND PELVIS TECHNIQUE: Multidetector CT imaging  through the chest, abdomen and pelvis was performed using the standard protocol during bolus administration of intravenous contrast. Multiplanar reconstructed images and MIPs were obtained and reviewed to evaluate the vascular anatomy. CONTRAST:  196mL ISOVUE-370 IOPAMIDOL (ISOVUE-370) INJECTION 76% COMPARISON:  None. FINDINGS: CTA CHEST FINDINGS Cardiovascular: Mild four-chamber cardiac enlargement. No pericardial effusion. Dilated central pulmonary arteries. Satisfactory opacification of pulmonary arteries noted, and there is no evidence of pulmonary emboli. Left atrial enlargement. Incomplete opacification of left atrial appendage consistent with incomplete contrast mixing versus thrombus. Heavy mitral valve calcifications. Moderate coronary calcifications. Adequate contrast opacification of the thoracic aorta with no evidence of dissection, aneurysm, or stenosis. There is classic 3-vessel brachiocephalic arch anatomy without proximal stenosis. Minimal aortic atheromatous plaque. No significant mural thrombus. Mediastinum/Nodes: No hilar or mediastinal adenopathy. Lungs/Pleura: No pleural effusion. No pneumothorax. Lungs are clear. Musculoskeletal: Anterior vertebral endplate spurring at multiple levels in the mid and lower thoracic spine. No fracture or worrisome bone lesion. Review of the MIP images confirms the above findings. CTA ABDOMEN AND PELVIS FINDINGS VASCULAR Aorta: Minimal calcified aortic plaque. No significant mural thrombus. No aneurysm, dissection, or stenosis. Celiac: Patent without evidence of aneurysm, dissection, vasculitis or significant stenosis. SMA: Patent without evidence of aneurysm, dissection, vasculitis or significant stenosis. Renals: Both renal arteries are patent without evidence of aneurysm, dissection, vasculitis, fibromuscular dysplasia or significant stenosis. IMA: Patent without evidence of aneurysm, dissection, vasculitis or significant stenosis. Inflow: Minimal tortuosity.  Scattered nonocclusive calcified plaque and proximal common iliac arteries and internal iliac arteries. No aneurysm, dissection,  or stenosis. Outflow: Minimal calcified plaque in bilateral common iliac arteries. Visualized proximal portions of the deep and superficial femoral arteries patent. Veins: No obvious venous abnormality within the limitations of this arterial phase study. Review of the MIP images confirms the above findings. NON-VASCULAR Hepatobiliary: No focal liver abnormality is seen. No gallstones, gallbladder wall thickening, or biliary dilatation. Pancreas: Unremarkable. No pancreatic ductal dilatation or surrounding inflammatory changes. Spleen: Normal in size without focal abnormality. Adrenals/Urinary Tract: Normal adrenals. Probable 3.5 cm cyst, mid right kidney. No hydronephrosis. Urinary bladder incompletely distended. Stomach/Bowel: Stomach and small bowel are nondilated. Appendix surgically absent. The colon is nondilated, unremarkable. Lymphatic: No abdominal or pelvic adenopathy. Reproductive: Prostatic enlargement with central coarse calcifications. Other: No ascites.  No free air. Musculoskeletal: Obesity.  No acute or significant osseous findings. Review of the MIP images confirms the above findings. IMPRESSION: 1. Mild aortoiliac plaque without stenosis, aneurysm, severe tortuosity, or significant mural thrombus. 2. Thrombus versus incomplete contrast mixing in the left atrial appendage. Electronically Signed   By: Lucrezia Europe M.D.   On: 06/13/2018 17:16    Cardiac Studies:  Assessment/Plan:  Severe mitral stenosis./moderate MR Left atrial appendage thrombus Pulmonary hypertension. Questionable rheumatic heart disease Coronary artery disease, history of inferior wall MI in the past, status post multivessel PCI in the past. Chronic atrial fibrillation. Decompensated systolic congestive heart failure. Mild volume overload Hypertension. COPD Morbid obesity. Obstructive sleep  apnea. Hyperlipidemia. History of kidney stones. Plan Change by mouth Lasix to IV as per orders.  check labs in a.m. Patient advised to restrict fluid, 1 L per 24 hours   LOS: 4 days    Jeffrey Larson 06/14/2018, 10:23 AM

## 2018-06-15 LAB — PROTIME-INR
INR: 1.56
PROTHROMBIN TIME: 18.6 s — AB (ref 11.4–15.2)

## 2018-06-15 LAB — CBC
HEMATOCRIT: 49.1 % (ref 39.0–52.0)
Hemoglobin: 15.5 g/dL (ref 13.0–17.0)
MCH: 27.7 pg (ref 26.0–34.0)
MCHC: 31.6 g/dL (ref 30.0–36.0)
MCV: 87.7 fL (ref 78.0–100.0)
Platelets: 187 10*3/uL (ref 150–400)
RBC: 5.6 MIL/uL (ref 4.22–5.81)
RDW: 17.2 % — AB (ref 11.5–15.5)
WBC: 7.7 10*3/uL (ref 4.0–10.5)

## 2018-06-15 LAB — HEPARIN LEVEL (UNFRACTIONATED)
Heparin Unfractionated: 2 IU/mL — ABNORMAL HIGH (ref 0.30–0.70)
Heparin Unfractionated: 0.27 IU/mL — ABNORMAL LOW (ref 0.30–0.70)

## 2018-06-15 MED ORDER — WARFARIN SODIUM 7.5 MG PO TABS
7.5000 mg | ORAL_TABLET | Freq: Once | ORAL | Status: AC
  Administered 2018-06-15: 7.5 mg via ORAL
  Filled 2018-06-15: qty 1

## 2018-06-15 MED ORDER — POTASSIUM CHLORIDE CRYS ER 20 MEQ PO TBCR
20.0000 meq | EXTENDED_RELEASE_TABLET | Freq: Two times a day (BID) | ORAL | Status: DC
  Administered 2018-06-15 – 2018-06-17 (×4): 20 meq via ORAL
  Filled 2018-06-15 (×4): qty 1

## 2018-06-15 NOTE — Progress Notes (Signed)
Lab notified of increase heparin levels. Phlebotomy to redraw lab. MD made aware.

## 2018-06-15 NOTE — Progress Notes (Signed)
Subjective:  Patient denies any chest pain or shortness of breath. States breathing has improved markedly diuresing well . INR still pending    Objective:  Vital Signs in the last 24 hours: Temp:  [98 F (36.7 C)-98.5 F (36.9 C)] 98 F (36.7 C) (07/21 0422) Pulse Rate:  [68-95] 92 (07/21 0923) Resp:  [20-24] 24 (07/21 0810) BP: (94-129)/(66-76) 96/74 (07/21 0923) SpO2:  [94 %-99 %] 96 % (07/21 0810) Weight:  [136 kg (299 lb 12.8 oz)] 136 kg (299 lb 12.8 oz) (07/21 0416)  Intake/Output from previous day: 07/20 0701 - 07/21 0700 In: 1283.6 [P.O.:640; I.V.:643.6] Out: 3700 [Urine:3700] Intake/Output from this shift: Total I/O In: 240 [P.O.:240] Out: -   Physical Exam: Neck: no adenopathy, no carotid bruit, no JVD and supple, symmetrical, trachea midline Lungs: clear to auscultation bilaterally Heart: irregularly irregular rhythm, S1, S2 normal and 2/6 systolic and diastolic murmur noted Abdomen: soft, non-tender; bowel sounds normal; no masses,  no organomegaly Extremities: No clubbing cyanosis trace edema noted  Lab Results: Recent Labs    06/14/18 0611 06/15/18 0557  WBC 6.9 7.7  HGB 15.9 15.5  PLT 185 187   Recent Labs    06/14/18 0611  NA 140  K 4.1  CL 107  CO2 24  GLUCOSE 110*  BUN 15  CREATININE 1.25*   No results for input(s): TROPONINI in the last 72 hours.  Invalid input(s): CK, MB Hepatic Function Panel No results for input(s): PROT, ALBUMIN, AST, ALT, ALKPHOS, BILITOT, BILIDIR, IBILI in the last 72 hours. No results for input(s): CHOL in the last 72 hours. No results for input(s): PROTIME in the last 72 hours.  Imaging: Imaging results have been reviewed and Ct Angio Chest/abd/pel For Dissection W And/or W/wo  Result Date: 06/13/2018 CLINICAL DATA:  Severe mitral stenosis/regurgitation. EXAM: CT ANGIOGRAPHY CHEST, ABDOMEN AND PELVIS TECHNIQUE: Multidetector CT imaging through the chest, abdomen and pelvis was performed using the standard  protocol during bolus administration of intravenous contrast. Multiplanar reconstructed images and MIPs were obtained and reviewed to evaluate the vascular anatomy. CONTRAST:  178mL ISOVUE-370 IOPAMIDOL (ISOVUE-370) INJECTION 76% COMPARISON:  None. FINDINGS: CTA CHEST FINDINGS Cardiovascular: Mild four-chamber cardiac enlargement. No pericardial effusion. Dilated central pulmonary arteries. Satisfactory opacification of pulmonary arteries noted, and there is no evidence of pulmonary emboli. Left atrial enlargement. Incomplete opacification of left atrial appendage consistent with incomplete contrast mixing versus thrombus. Heavy mitral valve calcifications. Moderate coronary calcifications. Adequate contrast opacification of the thoracic aorta with no evidence of dissection, aneurysm, or stenosis. There is classic 3-vessel brachiocephalic arch anatomy without proximal stenosis. Minimal aortic atheromatous plaque. No significant mural thrombus. Mediastinum/Nodes: No hilar or mediastinal adenopathy. Lungs/Pleura: No pleural effusion. No pneumothorax. Lungs are clear. Musculoskeletal: Anterior vertebral endplate spurring at multiple levels in the mid and lower thoracic spine. No fracture or worrisome bone lesion. Review of the MIP images confirms the above findings. CTA ABDOMEN AND PELVIS FINDINGS VASCULAR Aorta: Minimal calcified aortic plaque. No significant mural thrombus. No aneurysm, dissection, or stenosis. Celiac: Patent without evidence of aneurysm, dissection, vasculitis or significant stenosis. SMA: Patent without evidence of aneurysm, dissection, vasculitis or significant stenosis. Renals: Both renal arteries are patent without evidence of aneurysm, dissection, vasculitis, fibromuscular dysplasia or significant stenosis. IMA: Patent without evidence of aneurysm, dissection, vasculitis or significant stenosis. Inflow: Minimal tortuosity. Scattered nonocclusive calcified plaque and proximal common iliac  arteries and internal iliac arteries. No aneurysm, dissection, or stenosis. Outflow: Minimal calcified plaque in bilateral common iliac arteries. Visualized proximal  portions of the deep and superficial femoral arteries patent. Veins: No obvious venous abnormality within the limitations of this arterial phase study. Review of the MIP images confirms the above findings. NON-VASCULAR Hepatobiliary: No focal liver abnormality is seen. No gallstones, gallbladder wall thickening, or biliary dilatation. Pancreas: Unremarkable. No pancreatic ductal dilatation or surrounding inflammatory changes. Spleen: Normal in size without focal abnormality. Adrenals/Urinary Tract: Normal adrenals. Probable 3.5 cm cyst, mid right kidney. No hydronephrosis. Urinary bladder incompletely distended. Stomach/Bowel: Stomach and small bowel are nondilated. Appendix surgically absent. The colon is nondilated, unremarkable. Lymphatic: No abdominal or pelvic adenopathy. Reproductive: Prostatic enlargement with central coarse calcifications. Other: No ascites.  No free air. Musculoskeletal: Obesity.  No acute or significant osseous findings. Review of the MIP images confirms the above findings. IMPRESSION: 1. Mild aortoiliac plaque without stenosis, aneurysm, severe tortuosity, or significant mural thrombus. 2. Thrombus versus incomplete contrast mixing in the left atrial appendage. Electronically Signed   By: Lucrezia Europe M.D.   On: 06/13/2018 17:16    Cardiac Studies:  Assessment/Plan:  Severe mitral stenosis./moderate MR Left atrial appendage thrombus Pulmonary hypertension. Questionable rheumatic heart disease Coronary artery disease, history of inferior wall MI in the past, status post multivessel PCI in the past. Chronic atrial fibrillation. compensated systolic congestive heart failure. Mild volume overload improved Hypertension. COPD Morbid obesity. Obstructive sleep apnea. Hyperlipidemia. History of kidney  stones. Plan Continue present management Check PT/INR Will DC home today if INR above 2    LOS: 5 days    [name] 06/15/2018, 11:03 AM

## 2018-06-15 NOTE — Progress Notes (Signed)
[facility] for Heparin and Warfarin  Indication: Atrial Fibrillation and Left Atrial Appendage Thrombus  Allergies  Allergen Reactions  . Ramipril     cough    Patient Measurements: Height: 5\' 11"  (180.3 cm) Weight: 299 lb 12.8 oz (136 kg) IBW/kg (Calculated) : 75.3 Heparin Dosing Weight: 107 kg  Vital Signs: Temp: 98 F (36.7 C) (07/21 0422) Temp Source: Oral (07/21 0422) BP: 96/74 (07/21 0923) Pulse Rate: 92 (07/21 0923)  Labs: Recent Labs    06/13/18 0538  06/13/18 0924 06/14/18 0611 06/15/18 0557 06/15/18 0956  HGB 15.5  --   --  15.9 15.5  --   HCT 49.5  --   --  50.5 49.1  --   PLT 193  --   --  185 187  --   LABPROT  --   --  15.9* 16.7*  --  18.6*  INR  --   --  1.28 1.37  --  1.56  HEPARINUNFRC  --    < > 0.44 0.32 2.00* 0.27*  CREATININE  --   --   --  1.25*  --   --    < > = values in this interval not displayed.    Estimated Creatinine Clearance: 87.4 mL/min (A) (by C-G formula based on SCr of 1.25 mg/dL (H)).   Medical History: Past Medical History:  Diagnosis Date  . Afib ([place])   . COPD with chronic bronchitis ([place]) 09/08/2014  . Coronary artery disease   . History of pneumonia    RML on CXR 07/20/14  . Hyperlipidemia   . Myocardial infarction ([place])   . Renal disorder    history kidney stone   Assessment: 15 YOM with history of Afib on Coumadin PTA, last dose on 06/05/18 with admission INR of 1.25. Patient underwent cath 7/16 and found to have severe MV calcification requiring CVTS evaluation. IV heparin resumed post cath, coumadin resumed 7/17. TEE on 7/17 showed thrombus in atrial appendage.    Heparin level subtherapeutic at 0.27 after repeat lab. INR trending up, although still subtherapeutic, at 1.56. No bleeding/hematoma documented.   Home coumadin regimen 4mg  daily except 2mg  Mon.Sat.Sun.  Goal of Therapy:  Heparin level 0.3-0.7 units/ml INR 2-3 Monitor platelets by anticoagulation protocol:  Yes   Plan:  Increase heparin infusion 2000 units/hr Coumadin 7.5 mg daily Monitor for s/sx of bleeding/hematoma   Jackson Latino, PharmD PGY1 Pharmacy Resident Phone [phone] 06/15/2018     11:35 AM

## 2018-06-15 NOTE — Progress Notes (Signed)
MD made aware of INR results.

## 2018-06-16 ENCOUNTER — Other Ambulatory Visit: Payer: Self-pay | Admitting: *Deleted

## 2018-06-16 ENCOUNTER — Encounter (HOSPITAL_COMMUNITY): Payer: Self-pay | Admitting: Thoracic Surgery (Cardiothoracic Vascular Surgery)

## 2018-06-16 DIAGNOSIS — I272 Pulmonary hypertension, unspecified: Secondary | ICD-10-CM

## 2018-06-16 DIAGNOSIS — I340 Nonrheumatic mitral (valve) insufficiency: Secondary | ICD-10-CM

## 2018-06-16 DIAGNOSIS — I342 Nonrheumatic mitral (valve) stenosis: Secondary | ICD-10-CM

## 2018-06-16 DIAGNOSIS — I5042 Chronic combined systolic (congestive) and diastolic (congestive) heart failure: Secondary | ICD-10-CM | POA: Diagnosis present

## 2018-06-16 LAB — CBC
HEMATOCRIT: 50.6 % (ref 39.0–52.0)
Hemoglobin: 16.1 g/dL (ref 13.0–17.0)
MCH: 28 pg (ref 26.0–34.0)
MCHC: 31.8 g/dL (ref 30.0–36.0)
MCV: 88.2 fL (ref 78.0–100.0)
PLATELETS: 163 10*3/uL (ref 150–400)
RBC: 5.74 MIL/uL (ref 4.22–5.81)
RDW: 17.4 % — AB (ref 11.5–15.5)
WBC: 7.4 10*3/uL (ref 4.0–10.5)

## 2018-06-16 LAB — BASIC METABOLIC PANEL
ANION GAP: 12 (ref 5–15)
BUN: 17 mg/dL (ref 8–23)
CALCIUM: 9.3 mg/dL (ref 8.9–10.3)
CO2: 25 mmol/L (ref 22–32)
Chloride: 102 mmol/L (ref 98–111)
Creatinine, Ser: 1.22 mg/dL (ref 0.61–1.24)
Glucose, Bld: 97 mg/dL (ref 70–99)
Potassium: 4.4 mmol/L (ref 3.5–5.1)
SODIUM: 139 mmol/L (ref 135–145)

## 2018-06-16 LAB — MAGNESIUM: MAGNESIUM: 2 mg/dL (ref 1.7–2.4)

## 2018-06-16 LAB — HEPARIN LEVEL (UNFRACTIONATED): HEPARIN UNFRACTIONATED: 0.38 [IU]/mL (ref 0.30–0.70)

## 2018-06-16 LAB — PROTIME-INR
INR: 1.89
PROTHROMBIN TIME: 21.5 s — AB (ref 11.4–15.2)

## 2018-06-16 MED ORDER — WARFARIN SODIUM 3 MG PO TABS
6.0000 mg | ORAL_TABLET | Freq: Once | ORAL | Status: AC
  Administered 2018-06-16: 6 mg via ORAL
  Filled 2018-06-16: qty 2

## 2018-06-16 MED ORDER — HYPROMELLOSE (GONIOSCOPIC) 2.5 % OP SOLN
1.0000 [drp] | OPHTHALMIC | Status: DC | PRN
  Administered 2018-06-16: 1 [drp] via OPHTHALMIC
  Filled 2018-06-16: qty 15

## 2018-06-16 MED ORDER — FUROSEMIDE 10 MG/ML IJ SOLN
40.0000 mg | Freq: Two times a day (BID) | INTRAMUSCULAR | Status: DC
  Administered 2018-06-16 – 2018-06-17 (×2): 40 mg via INTRAVENOUS
  Filled 2018-06-16 (×2): qty 4

## 2018-06-16 NOTE — Progress Notes (Signed)
Patient c/o redness and itching in his right eye, stating he thinks he got some lint or cotton in his eye.  Dr. [name] notified and eye drops prn ordered and given.

## 2018-06-16 NOTE — Progress Notes (Signed)
[facility] for Heparin and Warfarin  Indication: Atrial Fibrillation and Left Atrial Appendage Thrombus  Allergies  Allergen Reactions  . Ramipril     cough    Patient Measurements: Height: 5\' 11"  (180.3 cm) Weight: 297 lb 12.8 oz (135.1 kg) IBW/kg (Calculated) : 75.3 Heparin Dosing Weight: 107 kg  Vital Signs: Temp: 97.8 F (36.6 C) (07/22 0404) Temp Source: Oral (07/22 0404) BP: 109/78 (07/22 0404) Pulse Rate: 89 (07/22 0404)  Labs: Recent Labs    06/14/18 0611 06/15/18 0557 06/15/18 0956 06/16/18 0640  HGB 15.9 15.5  --  16.1  HCT 50.5 49.1  --  50.6  PLT 185 187  --  163  LABPROT 16.7*  --  18.6* 21.5*  INR 1.37  --  1.56 1.89  HEPARINUNFRC 0.32 2.00* 0.27* 0.38  CREATININE 1.25*  --   --  1.22   Estimated Creatinine Clearance: 89.2 mL/min (by C-G formula based on SCr of 1.22 mg/dL).  Medical History: Past Medical History:  Diagnosis Date  . Chronic combined systolic and diastolic congestive heart failure ([place])   . COPD with chronic bronchitis ([place])   . Coronary artery disease   . Essential hypertension, benign    Ramipril to losartan Sept 2015   . History of pneumonia    RML on CXR 07/20/14  . Hyperlipidemia   . Longstanding persistent atrial fibrillation ([place])   . Mitral stenosis with regurgitation   . Myocardial infarction ([place])   . Pulmonary hypertension ([place])   . Renal disorder    history kidney stone   Assessment: 75 YOM with history of Afib on Coumadin PTA, last dose on 06/05/18 with admission INR of 1.25. Patient underwent cath 7/16 and found to have severe MV calcification requiring CVTS evaluation. IV heparin resumed post cath, coumadin resumed 7/17. TEE on 7/17 showed thrombus in atrial appendage.    Patient is planning for mitral valve replacement in ~ 2 weeks. CVTS recommends continuing warfarin given known thrombus. Pt will likely D/C home w/n the next couple days pending therapeutic INR.  Heparin  level therapeutic: 0.38 INR subtherapeutic: 1.89. No bleeding/hematoma documented.   Home coumadin regimen 4mg  daily except 2mg  Mon.Sat.Sun.  Goal of Therapy:  Heparin level 0.3-0.7 units/ml INR 2-3 Monitor platelets by anticoagulation protocol: Yes   Plan:  Continue heparin infusion 2000 units/hr Warfarin 6mg  x1 tonight  Monitor for s/sx of bleeding/hematoma  Georga Bora, PharmD Clinical Pharmacist 06/16/2018 10:06 AM Please check AMION for all [place] numbers

## 2018-06-16 NOTE — Progress Notes (Signed)
Subjective:  Appreciate Dr.Owens consult and help.  Patient denies any chest pain or shortness of breath, INR slowly trending up.  Patient planned for MVR and maze procedure in August2  Objective:  Vital Signs in the last 24 hours: Temp:  [97.8 F (36.6 C)-98.3 F (36.8 C)] 97.8 F (36.6 C) (07/22 0404) Pulse Rate:  [74-105] 89 (07/22 0404) Resp:  [18-20] 18 (07/22 0404) BP: (95-118)/(69-92) 109/78 (07/22 0404) SpO2:  [93 %-98 %] 95 % (07/22 0911) Weight:  [135.1 kg (297 lb 12.8 oz)] 135.1 kg (297 lb 12.8 oz) (07/22 0404)  Intake/Output from previous day: 07/21 0701 - 07/22 0700 In: 1275.6 [P.O.:820; I.V.:455.6] Out: 2500 [Urine:2500] Intake/Output from this shift: No intake/output data recorded.  Physical Exam: Neck: no adenopathy, no carotid bruit, no JVD and supple, symmetrical, trachea midline Lungs: clear to auscultation bilaterally Heart: irregularly irregular rhythm, S1, S2 normal and 2/6 systolic and diastolic murmur noted Abdomen: soft, non-tender; bowel sounds normal; no masses,  no organomegaly Extremities: no clubbing, cyanosis, 2+ edema noted  Lab Results: Recent Labs    06/15/18 0557 06/16/18 0640  WBC 7.7 7.4  HGB 15.5 16.1  PLT 187 163   Recent Labs    06/14/18 0611 06/16/18 0640  NA 140 139  K 4.1 4.4  CL 107 102  CO2 24 25  GLUCOSE 110* 97  BUN 15 17  CREATININE 1.25* 1.22   No results for input(s): TROPONINI in the last 72 hours.  Invalid input(s): CK, MB Hepatic Function Panel No results for input(s): PROT, ALBUMIN, AST, ALT, ALKPHOS, BILITOT, BILIDIR, IBILI in the last 72 hours. No results for input(s): CHOL in the last 72 hours. No results for input(s): PROTIME in the last 72 hours.  Imaging: Imaging results have been reviewed and No results found.  Cardiac Studies:  Assessment/Plan:  Severe mitral stenosis./moderate MR Left atrial appendage thrombus Pulmonary hypertension. Questionable rheumatic heart disease Coronary artery  disease, history of inferior wall MI in the past, status post multivessel PCI in the past. Chronic atrial fibrillation. compensatedsystolic congestive heart failure. Mild volume overload improved Hypertension. COPD Morbid obesity. Obstructive sleep apnea. Hyperlipidemia. History of kidney stones. Plan Continue present management. Possibly discharged tomorrow.  Once INR above 2   LOS: 6 days    [name] 06/16/2018, 9:51 AM

## 2018-06-16 NOTE — Consult Note (Signed)
[address]       [place] [address]             [phone]          CARDIOTHORACIC SURGERY CONSULTATION REPORT  PCP is [name], DO Referring Provider is Dixie Dials, MD Primary Cardiologist is [name], MD    Reason for consultation:  Severe mitral stenosis with mitral regurgitation  HPI:  Patient is a 62 year old morbidly obese male with history of coronary artery disease status post acute myocardial infarction in 2013 treated with multivessel PCI and stenting, chronic combined systolic and diastolic congestive heart failure, long-standing persistent atrial fibrillation, and COPD with chronic bronchitis who has been referred for surgical consultation to discuss treatment options for management of likely rheumatic heart disease with severe symptomatic mitral stenosis and mitral regurgitation.  The patient states that he first began to experience symptoms of exertional shortness of breath 8 or 9 years ago.  In 2013 he suffered an acute myocardial infarction and was treated with multivessel PCI and stenting.  He was noted to be in atrial fibrillation at that time and started on warfarin anticoagulation.  He has been followed intermittently ever since by Dr. [name].  He states that symptoms of exertional shortness of breath and lower extremity edema have gradually progressed.  Echocardiograms have documented the presence of preserved left ventricular systolic function with likely rheumatic mitral valve disease and severe mitral stenosis.  Over the past 2 to 3 years patient symptoms have limited his physical activity considerably, and recently he finds that he cannot do much of anything without getting short of breath.  He denies resting shortness of breath but he gets short of breath with very low level activity, such as just walking to his garage.  He denies history of PND, palpitations, dizzy spells, or syncope.  He has chronic lower extremity edema  and takes Lasix on a daily basis.  He denies any history of exertional chest pain or chest tightness although he reports some occasional episodes of tightness across his chest that seem to come and go without specific activities.  He was seen in follow-up recently by Dr. [name] and scheduled for elective TEE and catheterization.  Catheterization revealed moderate nonobstructive coronary artery disease with continued patency of stents placed previously in the right coronary artery in the left anterior descending coronary artery.  There was severe pulmonary hypertension.  Mean transvalvular gradient across the mitral valve was estimated 17.5 mmHg corresponding to mitral valve area calculated 0.84 cm.  TEE confirmed the presence of likely rheumatic mitral valve disease with severe mitral stenosis and moderate mitral regurgitation.  There was moderate left ventricular systolic dysfunction with ejection fraction estimated only 35 to 40%.  There appeared to be thrombus in the left atrial appendage.  Right ventricular function was mildly reduced.  There was moderate tricuspid regurgitation.  Cardiothoracic surgical consultation was requested.  Patient is married and lives in [place] with his wife.  He has 2 adult children, neither of whom live close by.  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  He has no other significant physical limitations and he specifically denies any problems with ambulation.  He admits that he has been overweight for most of his adult life and he lives a sedentary lifestyle.  He now gets  short of breath with very low level activity.  He states that he gets episodes of "bronchitis" 2 or 3 times every year during which time his breathing is "terrible".  Past Medical History:  Diagnosis Date  . Afib ([place])   . Chronic  combined systolic and diastolic congestive heart failure ([place])   . COPD with chronic bronchitis ([place])   . Coronary artery disease   . Essential hypertension, benign    Ramipril to losartan Sept 2015   . History of pneumonia    RML on CXR 07/20/14  . Hyperlipidemia   . Longstanding persistent atrial fibrillation ([place])   . Mitral stenosis with regurgitation   . Myocardial infarction ([place])   . Renal disorder    history kidney stone    Past Surgical History:  Procedure Laterality Date  . APPENDECTOMY    . CARDIOVASCULAR STRESS TEST  03/2014   Borderline reversible ischemic changes at the apex.  Normal LV contractility/EF 52%.  . CORONARY STENT PLACEMENT  7/12013  . LEFT HEART CATHETERIZATION WITH CORONARY ANGIOGRAM N/A 06/12/2012   Procedure: LEFT HEART CATHETERIZATION WITH CORONARY ANGIOGRAM;  Surgeon: Clent Demark, MD;  Location: [facility] CATH LAB;  Service: Cardiovascular;  Laterality: N/A;  . PERCUTANEOUS CORONARY STENT INTERVENTION (PCI-S) N/A 06/17/2012   Procedure: PERCUTANEOUS CORONARY STENT INTERVENTION (PCI-S);  Surgeon: Clent Demark, MD;  Location[facility] C[facility] LAB;  Service: Cardiovascular;  Laterality: N/A;  . RIGHT/LEFT HEART CATH AND CORONARY ANGIOGRAPHY N/A 06/10/2018   Procedure: RIGHT/LEFT HEART CATH AND CORONARY ANGIOGRAPHY;  Surgeon: Dixie Dials, MD;  Location: [facility] CV LAB;  Service: Cardiovascular;  Laterality: N/A;  . TEE WITHOUT CARDIOVERSION N/A 06/10/2018   Procedure: TRANSESOPHAGEAL ECHOCARDIOGRAM (TEE) with bubble study;  Surgeon: Dixie Dials, MD;  Location: [facility] ENDOSCOPY;  Service: Cardiovascular;  Laterality: N/A;    Family History  Problem Relation Age of Onset  . Heart disease Mother   . Heart disease Father     Social History   Socioeconomic History  . Marital status: Married    Spouse name: Not on file  . Number of children: Not on file  . Years of education: Not on file  . Highest education level: Not on file  Occupational History  . Not on  file  Social Needs  . Financial resource strain: Not on file  . Food insecurity:    Worry: Not on file    Inability: Not on file  . Transportation needs:    Medical: Not on file    Non-medical: Not on file  Tobacco Use  . Smoking status: Former Smoker    Packs/day: 2.00    Years: 15.00    Pack years: 30.00    Types: Cigarettes    Last attempt to quit: 11/26/1996    Years since quitting: 21.5  . Smokeless tobacco: Never Used  Substance and Sexual Activity  . Alcohol use: No  . Drug use: No  . Sexual activity: Yes  Lifestyle  . Physical activity:    Days per week: Not on file    Minutes per session: Not on file  . Stress: Not on file  Relationships  . Social connections:    Talks on phone: Not on file    Gets together: Not on file    Attends religious service: Not on file    Active member of club or organization: Not on file    Attends meetings of clubs or organizations: Not on file    Relationship status: Not on file  .  Intimate partner violence:    Fear of current or ex partner: Not on file    Emotionally abused: Not on file    Physically abused: Not on file    Forced sexual activity: Not on file  Other Topics Concern  . Not on file  Social History Narrative  . Not on file    Prior to Admission medications   Medication Sig Start Date End Date Taking? Authorizing Provider  aspirin 81 MG tablet Take 81 mg by mouth daily.   Yes [provider]  atorvastatin (LIPITOR) 40 MG tablet Take 40 mg by mouth daily.  09/29/17  Yes [provider]  Fluticasone-Umeclidin-Vilant (TRELEGY ELLIPTA) 100-62.5-25 MCG/INH AEPB Inhale 1 puff into the lungs daily. 01/27/18  Yes [name], DO  furosemide (LASIX) 40 MG tablet Take 40 mg by mouth 3 (three) times daily.   Yes [provider]  ipratropium-albuterol (DUONEB) 0.5-2.5 (3) MG/3ML SOLN Take 3 mLs by nebulization every 2 (two) hours as needed (wheeze, SOB). 10/24/17  Yes [name], DO    metoprolol succinate (TOPROL-XL) 50 MG 24 hr tablet Take 50 mg by mouth 2 (two) times daily. Take with or immediately following a meal.   Yes [provider]  nitroGLYCERIN (NITROSTAT) 0.4 MG SL tablet Place 1 tablet (0.4 mg total) under the tongue every 5 (five) minutes x 3 doses as needed for chest pain. 06/18/12 06/05/18 Yes [name], MD  potassium chloride SA (K-DUR,KLOR-CON) 20 MEQ tablet Take 40 mEq by mouth daily.  09/16/16  Yes [provider]  sacubitril-valsartan (ENTRESTO) 24-26 MG Take 1 tablet by mouth 2 (two) times daily.   Yes [provider]  spironolactone (ALDACTONE) 25 MG tablet Take 1 tablet (25 mg total) by mouth daily. 08/12/14  Yes Hommel, Sean, DO  warfarin (COUMADIN) 4 MG tablet Take 2-4 mg by mouth daily. Take 4 mg every day except on Monday and Saturday and Sunday take 2 mg.   Yes [provider]  Albuterol Sulfate (PROAIR RESPICLICK) 062 (90 Base) MCG/ACT AEPB Inhale 1 puff into the lungs every 6 (six) hours as needed (sob). 08/02/17   Hali Marry, MD  AMBULATORY NON FORMULARY MEDICATION Medication Name: Glucometer, lancets and strip to test every other day. Dx: Diabetes type 2, E11.9. 100 strip and 100 lancets 08/02/17   Hali Marry, MD    Current Facility-Administered Medications  Medication Dose Route Frequency Provider Last Rate Last Dose  . 0.9 %  sodium chloride infusion  250 mL Intravenous PRN Dixie Dials, MD      . acetaminophen (TYLENOL) tablet 650 mg  650 mg Oral Q4H PRN Dixie Dials, MD   650 mg at 06/11/18 2039  . digoxin (LANOXIN) tablet 0.25 mg  0.25 mg Oral Daily [name], MD   0.25 mg at 06/15/18 0925  . furosemide (LASIX) injection 40 mg  40 mg Intravenous Q12H [name], MD   40 mg at 06/15/18 2121  . guaiFENesin-dextromethorphan (ROBITUSSIN DM) 100-10 MG/5ML syrup 5 mL  5 mL Oral Q4H PRN Dixie Dials, MD   5 mL at 06/15/18 2130  . heparin ADULT infusion 100 units/mL (25000  units/269mL sodium chloride 0.45%)  2,000 Units/hr Intravenous Continuous Lore, Melissa A, RPH 20 mL/hr at 06/16/18 0628 2,000 Units/hr at 06/16/18 0628  . levalbuterol (XOPENEX) nebulizer solution 1.25 mg  1.25 mg Nebulization TID [name], MD   1.25 mg at 06/15/18 2114  . loratadine (CLARITIN) tablet 10 mg  10 mg Oral Daily  PRN Dixie Dials, MD   10 mg at 06/14/18 0957  . metoprolol tartrate (LOPRESSOR) tablet 25 mg  25 mg Oral BID [name], MD   25 mg at 06/15/18 2124  . ondansetron (ZOFRAN) injection 4 mg  4 mg Intravenous Q6H PRN Dixie Dials, MD      . potassium chloride SA (K-DUR,KLOR-CON) CR tablet 20 mEq  20 mEq Oral BID [name], MD   20 mEq at 06/15/18 2146  . sodium chloride flush (NS) 0.9 % injection 3 mL  3 mL Intravenous Q12H Dixie Dials, MD   3 mL at 06/15/18 2125  . sodium chloride flush (NS) 0.9 % injection 3 mL  3 mL Intravenous PRN Dixie Dials, MD      . Warfarin - Pharmacist Dosing Inpatient   Does not apply q1800 Rush Barer, [facility]   1 each at 06/14/18 1801    Allergies  Allergen Reactions  . Ramipril     cough      Review of Systems:   General:  normal appetite, decreased energy, no weight gain, no weight loss, no fever  Cardiac:  no chest pain with exertion, occasional chest pain at rest, + SOB with any exertion, no resting SOB, no PND, no orthopnea, no palpitations, + arrhythmia, + atrial fibrillation, + LE edema, no dizzy spells, no syncope  Respiratory:  + shortness of breath, no home oxygen, no productive cough, no dry cough, occasional bronchitis, no wheezing, no hemoptysis, no asthma, no pain with inspiration or cough, no sleep apnea, no CPAP at night  GI:   no difficulty swallowing, no reflux, no frequent heartburn, no hiatal hernia, no abdominal pain, no constipation, no diarrhea, no hematochezia, no hematemesis, no melena  GU:   no dysuria,  no frequency, no urinary tract infection, no hematuria, no enlarged prostate, + kidney  stones, no kidney disease  Vascular:  no pain suggestive of claudication, no pain in feet, no leg cramps, no varicose veins, no DVT, no non-healing foot ulcer  Neuro:   no stroke, no TIA's, no seizures, no headaches, no temporary blindness one eye,  no slurred speech, no peripheral neuropathy, no chronic pain, no instability of gait, no memory/cognitive dysfunction  Musculoskeletal: no arthritis , no joint swelling, no myalgias, no difficulty walking, normal mobility   Skin:   no rash, no itching, no skin infections, no pressure sores or ulcerations  Psych:   no anxiety, no depression, no nervousness, no unusual recent stress  Eyes:   no blurry vision, no floaters, no recent vision changes, + wears glasses for reading  ENT:   no hearing loss, no loose or painful teeth, no dentures, multiple missing teeth, last saw dentist 3 months ago  Hematologic:  + easy bruising, no abnormal bleeding, no clotting disorder, no frequent epistaxis  Endocrine:  no diabetes, does not check CBG's at home     Physical Exam:   BP 109/78 (BP Location: Right Arm)   Pulse 89   Temp 97.8 F (36.6 C) (Oral)   Resp 18   Ht 5\' 11"  (1.803 m)   Wt 297 lb 12.8 oz (135.1 kg)   SpO2 93%   BMI 41.53 kg/m   General:  Morbidly obese,  well-appearing  HEENT:  Unremarkable   Neck:   no JVD, no bruits, no adenopathy   Chest:   clear to auscultation, symmetrical breath sounds, no wheezes, no rhonchi   CV:   Irregular rate and rhythm, grade II/VI systolic murmur  Abdomen:  soft, non-tender, no masses   Extremities:  warm, well-perfused, pulses diminished but palpable, 2/3+ lower extremity edema  Rectal/GU  Deferred  Neuro:   Grossly non-focal and symmetrical throughout  Skin:   Clean and dry, no rashes, no breakdown  Diagnostic Tests:  Lab Results: Recent Labs    06/15/18 0557 06/16/18 0640  WBC 7.7 7.4  HGB 15.5 16.1  HCT 49.1 50.6  PLT 187 163   BMET:  Recent Labs    06/14/18 0611 06/16/18 0640  NA 140  139  K 4.1 4.4  CL 107 102  CO2 24 25  GLUCOSE 110* 97  BUN 15 17  CREATININE 1.25* 1.22  CALCIUM 9.2 9.3    CBG (last 3)  No results for input(s): GLUCAP in the last 72 hours. PT/INR:   Recent Labs    06/16/18 0640  LABPROT 21.5*  INR 1.89    CXR:  N/A  Transthoracic Echocardiography  Patient:    Shakai, Dolley MR #:       [id number] Study Date: 10/29/2017 Gender:     M Age:        46 Height:     180.3 cm Weight:     137.5 kg BSA:        2.69 m^2 Pt. Status: Room:   SONOGRAPHER  [facility]  PERFORMING   [facility], Forestine Na  ATTENDING    [name]  Jeryl Columbia, Natalie  REFERRING    [name]  cc:  ------------------------------------------------------------------- LV EF: 50% -   55%  ------------------------------------------------------------------- Indications:      CHF - 428.0.  ------------------------------------------------------------------- History:   PMH:  Former Smoker. Mitral Valve Stenosis.  Atrial fibrillation.  Coronary artery disease.  Chronic obstructive pulmonary disease.  PMH:   Myocardial infarction.  Risk factors: Hypertension. Diabetes mellitus. Dyslipidemia.  ------------------------------------------------------------------- Study Conclusions  - Procedure narrative: Transthoracic echocardiography. Image   quality was adequate. Intravenous contrast (Definity) was   administered. - Left ventricle: The cavity size was normal. Wall thickness was   increased in a pattern of mild LVH. Systolic function was normal.   The estimated ejection fraction was in the range of 50% to 55%.   Wall motion was normal; there were no regional wall motion   abnormalities. The study was not technically sufficient to allow   evaluation of LV diastolic dysfunction due to atrial   fibrillation. - Aortic valve: Moderately calcified annulus. There was trivial   regurgitation. - Mitral valve: Severely thickened,  severely calcified leaflets .   Mobility was restricted. The findings are consistent with severe   stenosis. Mean gradient (D): 15 mm Hg. Valve area by pressure   half-time: 1.03 cm^2. - Left atrium: The atrium was mildly dilated. - Right ventricle: The cavity size was moderately dilated. Systolic   function was reduced. - Right atrium: The atrium was mildly dilated. - Atrial septum: No defect or patent foramen ovale was identified.  ------------------------------------------------------------------- Study data:  Comparison was made to the study of 11/13/2016.  Study status:  Routine.  Procedure:  Transthoracic echocardiography. Image quality was adequate. Intravenous contrast (Definity) was administered.          Transthoracic echocardiography.  M-mode, complete 2D, spectral Doppler, and color Doppler.  Birthdate: Patient birthdate: Jan 21, 1956.  Age:  Patient is 62 yr old.  Sex: Gender: male.    BMI: 42.3 kg/m^2.  Blood pressure:     146/62 Patient status:  Outpatient.  Study date:  Study  date: 10/29/2017. Study time: 01:02 PM.  Location:  Echo laboratory.  -------------------------------------------------------------------  ------------------------------------------------------------------- Left ventricle:  The cavity size was normal. Wall thickness was increased in a pattern of mild LVH. Systolic function was normal. The estimated ejection fraction was in the range of 50% to 55%. Wall motion was normal; there were no regional wall motion abnormalities. The study was not technically sufficient to allow evaluation of LV diastolic dysfunction due to atrial fibrillation.   ------------------------------------------------------------------- Aortic valve:  Poorly visualized.  Moderately calcified annulus. Doppler:  There was trivial regurgitation.  ------------------------------------------------------------------- Aorta:  Aortic root: The aortic root was normal in  size.  ------------------------------------------------------------------- Mitral valve:   Severely thickened, severely calcified leaflets . Mobility was restricted.  Doppler:   The findings are consistent with severe stenosis.      Valve area by pressure half-time: 1.03 cm^2. Indexed valve area by pressure half-time: 0.38 cm^2/m^2. Mean gradient (D): 15 mm Hg.  ------------------------------------------------------------------- Left atrium:  The atrium was mildly dilated.  ------------------------------------------------------------------- Atrial septum:  No defect or patent foramen ovale was identified.   ------------------------------------------------------------------- Right ventricle:  The cavity size was moderately dilated. Systolic function was reduced.  ------------------------------------------------------------------- Pulmonic valve:   Poorly visualized.  Doppler:  There was no significant regurgitation.  ------------------------------------------------------------------- Tricuspid valve:  Poorly visualized.  Doppler:  There was no significant regurgitation.  ------------------------------------------------------------------- Right atrium:  The atrium was mildly dilated.  ------------------------------------------------------------------- Pericardium:  There was no pericardial effusion.  ------------------------------------------------------------------- Systemic veins: Inferior vena cava: The vessel was normal in size. The respirophasic diameter changes were in the normal range (>= 50%), consistent with normal central venous pressure.  ------------------------------------------------------------------- Measurements   Left ventricle                           Value          Reference  LV ID, ED, PLAX chordal                  46.9  mm       43 - 52  LV ID, ES, PLAX chordal                  33.2  mm       23 - 38  LV fx shortening, PLAX chordal            29    %        >=29  LV PW thickness, ED                      12.9  mm       ----------  IVS/LV PW ratio, ED                      0.97           <=1.3  LV e&', lateral                           4.57  cm/s     ----------  LV e&', medial                            3.7   cm/s     ----------  LV e&', average  4.14  cm/s     ----------    Ventricular septum                       Value          Reference  IVS thickness, ED                        12.5  mm       ----------    LVOT                                     Value          Reference  LVOT ID, S                               20    mm       ----------  LVOT area                                3.14  cm^2     ----------    Aortic valve                             Value          Reference  Aortic regurg pressure half-time         244   ms       ----------    Aorta                                    Value          Reference  Aortic root ID, ED                       31    mm       ----------    Left atrium                              Value          Reference  LA ID, A-P, ES                           54    mm       ----------  LA ID/bsa, A-P                           2.01  cm/m^2   <=2.2  LA volume, S                             96    ml       ----------  LA volume/bsa, S                         35.7  ml/m^2   ----------  LA volume, ES, 1-p A4C  107   ml       ----------  LA volume/bsa, ES, 1-p A4C               39.8  ml/m^2   ----------  LA volume, ES, 1-p A2C                   82.8  ml       ----------  LA volume/bsa, ES, 1-p A2C               30.8  ml/m^2   ----------    Mitral valve                             Value          Reference  Mitral mean velocity, D                  180   cm/s     ----------  Mitral pressure half-time                200   ms       ----------  Mitral mean gradient, D                  15    mm Hg    ----------  Mitral valve area, PHT, DP               1.03  cm^2      ----------  Mitral valve area/bsa, PHT, DP           0.38  cm^2/m^2 ----------  Mitral annulus VTI, D                    72.4  cm       ----------    Right atrium                             Value          Reference  RA ID, S-I, ES, A4C              (H)     66.5  mm       34 - 49  RA area, ES, A4C                 (H)     27    cm^2     8.3 - 19.5  RA volume, ES, A/L                       81.8  ml       ----------  RA volume/bsa, ES, A/L                   30.4  ml/m^2   ----------    Systemic veins                           Value          Reference  Estimated CVP                            3     mm Hg    ----------  Legend: (L)  and  (H)  mark values outside specified reference range.  -------------------------------------------------------------------  Prepared and Electronically Authenticated by  Kate Sable, MD 2018-12-04T15:34:22    RIGHT/LEFT HEART CATH AND CORONARY ANGIOGRAPHY  Conclusion     Prox RCA lesion is 10% stenosed.  Mid RCA lesion is 50% stenosed.  Post Atrio lesion is 30% stenosed.  Ost LAD to Prox LAD lesion is 20% stenosed.  Prox Cx lesion is 20% stenosed.  Hemodynamic findings consistent with severe pulmonary hypertension.  LV end diastolic pressure is normal.   Severe MV calcification. MV area 0.84 cm2. TEE and CVTS consult for MV surgery. Life-style modification for CAD.  No indication for antiplatelet therapy at this time.   Indications   Atherosclerosis of native coronary artery of native heart without angina pectoris [I25.10 (ICD-10-CM)]  Shortness of breath [R06.02 (ICD-10-CM)]  Non-rheumatic mitral valve stenosis [F57.3 (ICD-10-CM)]  Procedural Details/Technique   Technical Details PROCEDURE: Right and Left heart catheterization with selective coronary angiography, left ventriculogram.  CLINICAL HISTORY: This is a 62 year old male with MS, atrial fibrillation and CAD with stents has exertional dyspnea.  The risks,  benefits, and details of the procedure were explained to the patient. The patient verbalized understanding and wanted to proceed. Informed written consent was obtained.  PROCEDURE TECHNIQUE: The patient was approached from the right femoral vein using 7 French short sheath and right femoral artery using a 5 French short sheath. Right heart pressures, cardiac output and oxygen saturation were measured using a Swan Ganz catheter placed through right femoral vein. Left coronary angiography was done using a Judkins L4 guide catheter. Right coronary angiography was done using a Judkins R4 guide catheter. Left ventriculography was done using a pigtail catheter.   CONTRAST: Total of 65 cc.  Total Sedation time: I was present -56 Minutes face to face 100 % of the time.   Estimated blood loss <50 mL.  During this procedure the patient was administered the following to achieve and maintain moderate conscious sedation: Versed 1 mg, Fentanyl 50 mcg, while the patient's heart rate, blood pressure, and oxygen saturation were continuously monitored. The period of conscious sedation was 56 minutes, of which I was present face-to-face 100% of this time.  Complications   Complications documented before study signed (06/10/2018 10:12 AM EDT)    No complications were associated with this study.  Documented by Dixie Dials, MD - 06/10/2018 10:09 AM EDT    Coronary Findings   Diagnostic  Dominance: Right  Left Main  Vessel was injected. Vessel is normal in caliber.  Left Anterior Descending  Ost LAD to Prox LAD lesion 20% stenosed  Ost LAD to Prox LAD lesion is 20% stenosed. Vessel is not the culprit lesion. The lesion is irregular. The lesion was previously treated using a drug eluting stent over 2 years ago. Previously placed stent displays rethrombosis. Previously placed stent displays no restenosis. The stenosis was measured by a visual reading. Pressure wire/FFR was not performed on the lesion. IVUS was not  performed.  Left Circumflex  Vessel was injected. Vessel is normal in caliber.  Prox Cx lesion 20% stenosed  Prox Cx lesion is 20% stenosed. Vessel is not the culprit lesion. The lesion is type C and irregular. The lesion was previously treated using a drug eluting stent over 2 years ago. Previously placed stent displays no rethrombosis. Previously placed stent displays restenosis. The stenosis was measured by a visual reading. Pressure wire/FFR was not performed on the lesion. IVUS was not performed.  Right Coronary Artery  Vessel was injected. Vessel is normal in caliber.  Prox RCA lesion 10% stenosed  Prox RCA lesion is 10% stenosed. Vessel is not the culprit lesion. The lesion is type A and irregular. The lesion was previously treated using a drug eluting stent over 2 years ago. Previously placed stent displays no rethrombosis. Previously placed stent displays no restenosis. The stenosis was measured by a visual reading. Pressure wire/FFR was not performed on the lesion. IVUS was not performed.  Mid RCA lesion 50% stenosed  Mid RCA lesion is 50% stenosed. Vessel is not the culprit lesion. The lesion is type C and eccentric. The lesion was not previously treated. The stenosis was measured by a visual reading. Pressure wire/FFR was not performed on the lesion. IVUS was not performed.  Right Posterior Atrioventricular Branch  Post Atrio lesion 30% stenosed  Post Atrio lesion is 30% stenosed. Vessel is not the culprit lesion. The lesion is type C and irregular. The lesion was not previously treated. The stenosis was measured by a visual reading. Pressure wire/FFR was not performed on the lesion. IVUS was not performed.  Intervention   No interventions have been documented.  Right Heart   Right Heart Pressures Hemodynamic findings consistent with severe pulmonary hypertension. LV EDP is normal.  Right Atrium Right atrial pressure is elevated.  Wall Motion   Resting       EF 35-40 %. Severe  MV calcification on fluoroscopy.          Coronary Diagrams   Diagnostic Diagram       Implants    No implant documentation for this case.  MERGE Images   Show images for CARDIAC CATHETERIZATION   Link to Procedure Log   Procedure Log    Hemo Data    Most Recent Value  Fick Cardiac Output 4.21 L/min  Fick Cardiac Output Index 1.67 (L/min)/BSA  Thermal Cardiac Output 9.59 L/min  Thermal Cardiac Output Index 3.8 (L/min)/BSA  Aortic Mean Gradient 0.31 mmHg  Aortic Peak Gradient 0 mmHg  Aortic Valve Area >3.50  Aortic Value Area Index 1.39 cm2/BSA  Mitral Mean Gradient 17.47 mmHg  Mitral Peak Gradient 15 mmHg  Mitral Valve Area Index 0.33 cm2/BSA  RA A Wave -99 mmHg  RA V Wave 20 mmHg  RA Mean 19 mmHg  RV Systolic Pressure 63 mmHg  RV Diastolic Pressure 16 mmHg  RV EDP 17 mmHg  PA Systolic Pressure 72 mmHg  PA Diastolic Pressure 42 mmHg  PA Mean 53 mmHg  PW A Wave -99 mmHg  PW V Wave 40 mmHg  PW Mean 31 mmHg  AO Systolic Pressure 96 mmHg  AO Diastolic Pressure 69 mmHg  AO Mean 80 mmHg  LV Systolic Pressure 948 mmHg  LV Diastolic Pressure 8 mmHg  LV EDP 14 mmHg  AOp Systolic Pressure 546 mmHg  AOp Diastolic Pressure 71 mmHg  AOp Mean Pressure 85 mmHg  LVp Systolic Pressure 92 mmHg  LVp Diastolic Pressure 10 mmHg  LVp EDP Pressure 16 mmHg  QP/QS 1  TPVR Index 31.73 HRUI  TSVR Index 47.9 HRUI  PVR SVR Ratio 0.34  TPVR/TSVR Ratio 0.66      Transesophageal Echocardiography  Patient:    Blease, Capaldi MR #:       [id number] Study Date: 06/10/2018 Gender:     M Age:        3 Height:     180.3 cm Weight:     137.9 kg BSA:        2.69 m^2 Pt. Status: Room:  [place], MD  ATTENDING    Dixie Dials, MD  ORDERING     Dixie Dials, MD  PERFORMING   Dixie Dials, MD  REFERRING    Dixie Dials, MD  SONOGRAPHER  Jannett Celestine, RDCS  cc:  ------------------------------------------------------------------- LV  EF: 35% -   40%  ------------------------------------------------------------------- History:   PMH:  Non-rheumatic mitral stenosis evaluation.  Atrial fibrillation.  Coronary artery disease.  Chronic obstructive pulmonary disease.  PMH:   Myocardial infarction.  Risk factors: Dyslipidemia.  ------------------------------------------------------------------- Study Conclusions  - Left ventricle: Systolic function was moderately reduced. The   estimated ejection fraction was in the range of 35% to 40%.   Diffuse hypokinesis. - Aortic valve: There was mild regurgitation. - Mitral valve: Leaflet separation was severely reduced. Prolapse,   involving the middle scallop of the posterior leaflet. The   findings are consistent with severe stenosis. There was moderate   to severe regurgitation. Valve area by pressure half-time: 1.09   cm^2. - Left atrium: The atrium was moderately dilated. No evidence of   thrombus in the atrial cavity or appendage. There was a thrombus.   There was spontaneous echo contrast (&quot;smoke&quot;). - Right ventricle: Systolic function was mildly reduced. - Right atrium: No evidence of thrombus in the atrial cavity or   appendage. - Atrial septum: No defect or patent foramen ovale was identified. - Tricuspid valve: There was moderate regurgitation directed   eccentrically. - Pulmonary arteries: Systolic pressure was mildly increased.  Recommendations:  This procedure has been discussed with the referring physician.  ------------------------------------------------------------------- Study data:   Study status:  Routine.  Consent:  The risks, benefits, and alternatives to the procedure were explained to the patient and informed consent was obtained.  Procedure:  The patient reported no pain pre or post test. Initial setup. The patient was brought to the laboratory. Surface ECG leads were monitored. Sedation. Conscious sedation was administered by  cardiology staff. Transesophageal echocardiography. Topical anesthesia was obtained using viscous lidocaine. A transesophageal probe was inserted by the attending cardiologistwithout difficulty. Image quality was adequate.  Study completion:  The patient tolerated the procedure well. There were no complications.          Diagnostic transesophageal echocardiography.  2D and color Doppler. Birthdate:  Patient birthdate: 1955/12/06.  Age:  Patient is 62 yr old.  Sex:  Gender: male.    BMI: 42.4 kg/m^2.  Blood pressure: 97/63  Patient status:  Outpatient.  Study date:  Study date: 06/10/2018. Study time: 03:41 PM.  Location:  Endoscopy.  -------------------------------------------------------------------  ------------------------------------------------------------------- Left ventricle:  Systolic function was moderately reduced. The estimated ejection fraction was in the range of 35% to 40%. Diffuse hypokinesis.  ------------------------------------------------------------------- Aortic valve:   Trileaflet; mildly thickened, mildly calcified leaflets. Cusp separation was normal.  Doppler:  There was mild regurgitation.  ------------------------------------------------------------------- Aorta:  There was mild, diffuse atheromatous plaque. There was no evidence for dissection. Aortic root: The aortic root was not dilated. Ascending aorta: The ascending aorta was normal in size. Aortic arch: The aortic arch was normal in size. Descending aorta: The descending aorta was normal in size.  ------------------------------------------------------------------- Mitral valve:   Severely thickened, severely calcified leaflets anterior greater than posterior. Leaflet separation was severely reduced.  Prolapse, involving the middle scallop of the posterior leaflet.  Doppler:   The findings are consistent with severe stenosis.   There was moderate to severe regurgitation.    Valve area by  pressure half-time: 1.09  cm^2. Indexed valve area by pressure half-time: 0.41 cm^2/m^2.  ------------------------------------------------------------------- Left atrium:  The atrium was moderately dilated.  No evidence of thrombus in the atrial cavity or appendage. There was a thrombus. There was spontaneous echo contrast (&quot;smoke&quot;). The appendage was multilobulated and of normal size. Emptying velocity was normal.   ------------------------------------------------------------------- Atrial septum:  No defect or patent foramen ovale was identified.   ------------------------------------------------------------------- Pulmonary veins:  Well visualized.  ------------------------------------------------------------------- Right ventricle:  The cavity size was normal. Wall thickness was normal. Systolic function was mildly reduced.  ------------------------------------------------------------------- Pulmonic valve:   Not well visualized.  Structurally normal valve.   ------------------------------------------------------------------- Tricuspid valve:   Structurally normal valve.   Leaflet separation was normal.  Doppler:  There was moderate regurgitation directed eccentrically.  ------------------------------------------------------------------- Pulmonary artery:   The main pulmonary artery was normal-sized. Systolic pressure was mildly increased.  ------------------------------------------------------------------- Right atrium:  The atrium was normal in size.  No evidence of thrombus in the atrial cavity or appendage. The appendage was morphologically a right appendage.  ------------------------------------------------------------------- Pericardium:  There was no pericardial effusion.  ------------------------------------------------------------------- Measurements   Mitral valve                        Value  Mitral pressure half-time           210   ms   Mitral valve area, PHT, DP          1.09  cm^2  Mitral valve area/bsa, PHT, DP      0.41  cm^2/m^2  Legend: (L)  and  (H)  mark values outside specified reference range.  ------------------------------------------------------------------- Prepared and Electronically Authenticated by  Dixie Dials, MD 2019-07-17T08:58:47   CT ANGIOGRAPHY CHEST, ABDOMEN AND PELVIS  TECHNIQUE: Multidetector CT imaging through the chest, abdomen and pelvis was performed using the standard protocol during bolus administration of intravenous contrast. Multiplanar reconstructed images and MIPs were obtained and reviewed to evaluate the vascular anatomy.  CONTRAST:  141mL ISOVUE-370 IOPAMIDOL (ISOVUE-370) INJECTION 76%  COMPARISON:  None.  FINDINGS: CTA CHEST FINDINGS  Cardiovascular: Mild four-chamber cardiac enlargement. No pericardial effusion. Dilated central pulmonary arteries. Satisfactory opacification of pulmonary arteries noted, and there is no evidence of pulmonary emboli. Left atrial enlargement. Incomplete opacification of left atrial appendage consistent with incomplete contrast mixing versus thrombus. Heavy mitral valve calcifications. Moderate coronary calcifications.  Adequate contrast opacification of the thoracic aorta with no evidence of dissection, aneurysm, or stenosis. There is classic 3-vessel brachiocephalic arch anatomy without proximal stenosis. Minimal aortic atheromatous plaque. No significant mural thrombus.  Mediastinum/Nodes: No hilar or mediastinal adenopathy.  Lungs/Pleura: No pleural effusion. No pneumothorax. Lungs are clear.  Musculoskeletal: Anterior vertebral endplate spurring at multiple levels in the mid and lower thoracic spine. No fracture or worrisome bone lesion.  Review of the MIP images confirms the above findings.  CTA ABDOMEN AND PELVIS FINDINGS  VASCULAR  Aorta: Minimal calcified aortic plaque. No significant  mural thrombus. No aneurysm, dissection, or stenosis.  Celiac: Patent without evidence of aneurysm, dissection, vasculitis or significant stenosis.  SMA: Patent without evidence of aneurysm, dissection, vasculitis or significant stenosis.  Renals: Both renal arteries are patent without evidence of aneurysm, dissection, vasculitis, fibromuscular dysplasia or significant stenosis.  IMA: Patent without evidence of aneurysm, dissection, vasculitis or significant stenosis.  Inflow: Minimal tortuosity. Scattered nonocclusive calcified plaque and proximal common iliac arteries and internal iliac arteries. No aneurysm, dissection, or stenosis.  Outflow: Minimal calcified plaque in bilateral common iliac arteries.  Visualized proximal portions of the deep and superficial femoral arteries patent.  Veins: No obvious venous abnormality within the limitations of this arterial phase study.  Review of the MIP images confirms the above findings.  NON-VASCULAR  Hepatobiliary: No focal liver abnormality is seen. No gallstones, gallbladder wall thickening, or biliary dilatation.  Pancreas: Unremarkable. No pancreatic ductal dilatation or surrounding inflammatory changes.  Spleen: Normal in size without focal abnormality.  Adrenals/Urinary Tract: Normal adrenals. Probable 3.5 cm cyst, mid right kidney. No hydronephrosis. Urinary bladder incompletely distended.  Stomach/Bowel: Stomach and small bowel are nondilated. Appendix surgically absent. The colon is nondilated, unremarkable.  Lymphatic: No abdominal or pelvic adenopathy.  Reproductive: Prostatic enlargement with central coarse calcifications.  Other: No ascites.  No free air.  Musculoskeletal: Obesity.  No acute or significant osseous findings.  Review of the MIP images confirms the above findings.  IMPRESSION: 1. Mild aortoiliac plaque without stenosis, aneurysm, severe tortuosity, or significant mural  thrombus. 2. Thrombus versus incomplete contrast mixing in the left atrial appendage.   Electronically Signed   By: Lucrezia Europe M.D.   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  He describes a long history of gradual progression of symptoms of exertional shortness of breath and lower extremity edema consistent with chronic combined systolic and diastolic congestive heart failure, [place] Heart Association functional class III.  He states that over the past 2 years or so he really has not been able to do much of anything without getting short winded.  Symptoms are likely exacerbated by comorbid medical conditions including long-standing persistent atrial fibrillation, hypertension, morbid obesity and COPD with chronic bronchitis.  I have personally reviewed the patient's recent transesophageal echocardiogram and diagnostic cardiac catheterization.  TEE confirmed the presence of severe thickening, calcification, and restricted leaflet mobility involving both leaflets of the mitral valve.  There is severe mitral stenosis and type IIIA mitral valve dysfunction with moderate mitral regurgitation.  There is moderate left ventricular systolic dysfunction with hypokinesis of the inferior wall.  There is severe left atrial enlargement and mild right ventricular systolic dysfunction.  There was suggestion of possible clot in the tip of the left atrial appendage.  There was mild to moderate tricuspid regurgitation.  Catheterization reveals moderate nonobstructive coronary artery disease with continued patency of previous placed stents.  There is severe pulmonary hypertension.  I agree the patient would benefit from mitral valve replacement with concomitant Maze procedure.  Risks associated with surgery will be somewhat elevated because of the patient's numerous comorbid medical  problems including morbid obesity and severe pulmonary hypertension.   Plan:  The patient was counseled at length regarding the indications, risks and potential benefits of mitral valve replacement.  The rationale for elective surgery has been explained, including a comparison between surgery and continued medical therapy with close follow-up.  The relative risks and benefits of performing a maze procedure at the time of their surgery was discussed at length, including the expected likelihood of long term freedom from recurrent symptomatic atrial fibrillation and/or atrial flutter.  The patient understands and accepts all potential risks of surgery including but not limited to risk of death, stroke or other neurologic complication, myocardial infarction, congestive heart failure, respiratory failure, renal failure, bleeding requiring transfusion and/or reexploration, arrhythmia, infection or other wound complications, pneumonia, pleural and/or pericardial effusion, pulmonary embolus, aortic dissection or other major vascular complication,  or delayed complications related to valve repair or replacement including but not limited to structural valve deterioration and failure, thrombosis, embolization, endocarditis, or paravalvular leak.  All of his questions have been answered.    The patient is eager to proceed with surgery as soon as practical, although he hopes to make arrangements so that his 2 children can be present when his surgery is performed and during his subsequent postoperative recovery.  Given the presence of suspected clot in the left atrial appendage, it makes sense to wait at least a few weeks to make sure that his INR remains therapeutic.  However, it is possible that this clot may be chronic we will plan to obliterate the left atrial appendage at the time of surgery.  We will make arrangements for the patient  to follow-up in our office within the next few weeks and plan for surgery sometime  in August.  All of his questions have been addressed.   I spent in excess of 120 minutes during the conduct of this hospital consultation and >50% of this time involved direct face-to-face encounter for counseling and/or coordination of the patient's care.    Valentina Gu. Roxy Manns, MD 06/16/2018 9:08 AM

## 2018-06-16 NOTE — Progress Notes (Signed)
Pt had a episode of CP for about 1 min, the pain was in his L and R chest, did an ECG showed, Afib with R BBB, MD called and is aware, no other s/s, and CP resolved after the 1 minute episode, will continue to monitor, Thanks Buckner Malta.

## 2018-06-16 NOTE — Progress Notes (Signed)
[id number] Gave pt OHS booklet and care guide. Discussed sternal precautions and gave in the tube handout. Wrote down how to view pre op video. Pt stated he has IS at home and uses it frequently due to bouts of bronchitis. Discussed importance of IS and mobility after surgery. Pt stated his wife and son will be available 24/7 first week home. Pt stated he has been going to bathroom and heart rate goes up with activity. Pt stated he has weak knees. Pt may need PT after surgery. Graylon Good RN BSN 06/16/2018 10:02 AM

## 2018-06-17 LAB — CBC
HEMATOCRIT: 51.4 % (ref 39.0–52.0)
Hemoglobin: 16.2 g/dL (ref 13.0–17.0)
MCH: 27.8 pg (ref 26.0–34.0)
MCHC: 31.5 g/dL (ref 30.0–36.0)
MCV: 88.2 fL (ref 78.0–100.0)
Platelets: 205 10*3/uL (ref 150–400)
RBC: 5.83 MIL/uL — AB (ref 4.22–5.81)
RDW: 17.5 % — AB (ref 11.5–15.5)
WBC: 7.7 10*3/uL (ref 4.0–10.5)

## 2018-06-17 LAB — HEPARIN LEVEL (UNFRACTIONATED): Heparin Unfractionated: 0.24 IU/mL — ABNORMAL LOW (ref 0.30–0.70)

## 2018-06-17 LAB — PROTIME-INR
INR: 2.39
PROTHROMBIN TIME: 25.9 s — AB (ref 11.4–15.2)

## 2018-06-17 MED ORDER — LEVALBUTEROL HCL 1.25 MG/0.5ML IN NEBU: 1.2500 mg | INHALATION_SOLUTION | Freq: Four times a day (QID) | RESPIRATORY_TRACT | Status: DC | PRN

## 2018-06-17 MED ORDER — DIGOXIN 125 MCG PO TABS: 0.1250 mg | ORAL_TABLET | Freq: Every day | ORAL | 3 refills | Status: DC

## 2018-06-17 MED ORDER — WARFARIN SODIUM 3 MG PO TABS: 3.0000 mg | ORAL_TABLET | Freq: Once | ORAL | Status: DC

## 2018-06-17 NOTE — Discharge Summary (Signed)
NAME: Jeffrey Larson, [facility] MED[facility] RECORD PJ:[id number] ACCOUNT [id number] DATE OF BIRTH:1955-12-11 FACILITY: [facility] LOCATION: [facility] PHYSICIAN:Raynaldo Falco Daivd Council, MD  DISCHARGE SUMMARY  DATE OF DISCHARGE:  06/17/2018  DISCHARGE DIAGNOSES: 1.  Exertional dyspnea, probably angina equivalent/worsening of mitral stenosis/regurgitation. 2.  Valvular heart disease. 3.  Chronic atrial fibrillation. Systolic congestive heart failure. 4.  Chronic obstructive pulmonary disease. 5.  Multivessel coronary artery disease, history of inferior wall myocardial infarction in the past, status post stenting to right coronary artery, left anterior descending and left circumflex in the past. 6.  Hypertension. 7.  Hyperlipidemia. 8.  Morbid obesity. 9.  Obstructive sleep apnea. 10.  Osteoarthritis. 11.  History of kidney stones.  DISCHARGE DIAGNOSES: 1.  Severe mitral stenosis/mitral regurgitation, status post left and right cardiac catheterization. 2.  Left atrial appendage thrombus. 3.  Pulmonary hypertension. 4.  Questionable history of rheumatic heart disease. 5.  Coronary artery disease, history of inferior wall myocardial infarction in the past, status post multivessel percutaneous coronary intervention to left anterior descending, right coronary artery  and left circumflex in the past. 6.  Chronic atrial fibrillation. 7.  Compensated systolic congestive heart failure. 8.  Hypertension. 9.  Chronic obstructive pulmonary disease. 10.  Morbid obesity. 11.  Obstructive sleep apnea. 12.  Hyperlipidemia  history of kidney stones.    DISCHARGE MEDICATIONS:   1.  Digoxin 0.125 mg 1 tablet daily. Continue rest of the home medications, i.e., albuterol sulfate 1 puff every 6 hours as needed. 2.  Aldactone 25 mg daily. 3.  Aspirin 81 mg daily. 4.  Atorvastatin 40 mg daily. 5.  Entresto 24/20, mg 1 tablet twice daily. 6.  Trelegy Ellipta 1 puff daily. 7.  Furosemide 40 mg 3 times daily. 8.  DuoNeb 3 mL  by nebulizer 2 times daily as needed. 9.  Metoprolol succinate 50 mg 2 times daily as before. 10.  Nitrostat sublingual p.r.n. 11.  Potassium chloride 20 mEq 2 tablets daily. 12.  Coumadin 4 mg 5 days a week except Monday and Saturday.  Then Saturday, take 2 mg as before.  DIET:  Low salt, low cholesterol, weight-reducing diet.    FOLLOWUP:  The patient will be followed by CVTS as outpatient and will be scheduled for MVR and maze procedure next month.  CONDITION AT DISCHARGE:  Stable.  Heart failure instructions have been given.  Follow up with me in 1 week.  BRIEF HISTORY AND HOSPITAL COURSE:  The patient has been very 62 year old male with past medical history significant for coronary artery disease, history of inferior wall myocardial infarction in July of 2013 and subsequently had PCI to 100% occluded RCA  noted to have multivessel CAD requiring PTCA stenting to LAD and left circumflex, hypertension, chronic atrial fibrillation on chronic anticoagulation, COPD, morbid obesity, glucose intolerance, history of tobacco abuse, positive family history of  coronary artery disease, history of congestive heart failure secondary to systolic dysfunction, history of severe mitral stenosis.  He came to the office complaining of shortness of breath with minimal exertion.  States he feels tired, fatigued, short of  breath, walking a few yards.  Denies any chest pain, but complains of tightness with exertion.    The patient subsequently underwent Lexiscan Myoview which showed apical and basilar wall infarct with no evidence of ischemia with EF of 34%.  The patient denies any history of rheumatic fever as a child, but states he used to get strep throats quite  often as a child.  MEDICATIONS AT HOME:  He was  on Entresto 24/26 mg twice daily, Coumadin 4 mg 1 tablet daily, Lasix 40 mg 3 times daily, Nitrostat sublingual p.r.n., Toprol-XL 50 mg twice daily, atorvastatin 40 mg daily, spironolactone 25 mg daily,  potassium chloride 20  mEq 2 tablets daily and multiple inhalers as above.  PHYSICAL EXAMINATION: GENERAL:  Alert, awake, oriented x3. VITAL SIGNS:  Blood pressure was 116/82, pulse 77, irregularly irregular. HEENT:  Conjunctivae pink. NECK:  Supple, no JVD, no bruits. LUNGS:  Clear to auscultation with no rhonchi, rales or wheezing. CARDIOVASCULAR:  Irregularly irregular, II/VI systolic and diastolic murmur noted. EXTREMITIES:  There is no clubbing, cyanosis, 1+ edema was noted.  Neurologic was grossly intact.  LABORATORY DATA:  Admission labs, sodium was 139, potassium 4.8, BUN 22, creatinine 1.31.  Hemoglobin was 17.2, hematocrit 55.3, white count of 9.7, INR was 1-1.25.  Repeat PT/INR on 06/15/2018,  PT was 16.7, INR 1.37.  On 06/16/2018, INR was 1.89.   Today INR is 2.39.  Last lab:  Sodium 139, potassium 4.4, glucose was 97, BUN 17, creatinine 1.22.  Hemoglobin 16.1, hematocrit 50.6, white count of 7.4, platelet count 263,000 which has been stable.  BRIEF HOSPITAL COURSE:  The patient was a.m. admit and underwent right and left cardiac catheterization by Dr. Doylene Canard.  The patient was noted to have patent stents, but noted to have pulmonary hypertension and subsequently had a TEE, which showed severe  mitral stenosis and moderately severe mitral regurgitation with pulmonary hypertension and questionable left atrial appendage thrombus.  The patient was post-procedure, started on IV heparin and Coumadin.  CVTS consultation was obtained.  The patient  had initial workup for possible mitral valve replacement and a maze procedure with plication of left atrial appendage next month.    The patient was seen by CVTS and will be followed up by CVTS as outpatient.  The patient did not have any episodes of bleeding during the hospital stay, is tolerating Coumadin well.  His INR is in therapeutic range.  His groin has been stable.  The  patient has been ambulating in the hallway without any problems.   The patient will be discharged home on the above medications and will be followed up by me early next week and cardiothoracic vascular surgery as scheduled.  AN/NUANCE D:06/17/2018 T:06/17/2018 JOB:[id number]

## 2018-06-17 NOTE — Discharge Summary (Signed)
Discharge summary dictated on 06/17/2018, dictation number is [phone]

## 2018-06-17 NOTE — Progress Notes (Signed)
[facility] for Heparin and Warfarin  Indication: Atrial Fibrillation and Left Atrial Appendage Thrombus  Allergies  Allergen Reactions  . Ramipril     cough    Patient Measurements: Height: 5\' 11"  (180.3 cm) Weight: 298 lb 11.2 oz (135.5 kg) IBW/kg (Calculated) : 75.3 Heparin Dosing Weight: 107 kg  Vital Signs: Temp: 98.1 F (36.7 C) (07/23 1203) Temp Source: Oral (07/23 1203) BP: 101/62 (07/23 1203) Pulse Rate: 60 (07/23 1203)  Labs: Recent Labs    06/15/18 0557 06/15/18 0956 06/16/18 0640 06/17/18 0749 06/17/18 0931  HGB 15.5  --  16.1 16.2  --   HCT 49.1  --  50.6 51.4  --   PLT 187  --  163 205  --   LABPROT  --  18.6* 21.5*  --  25.9*  INR  --  1.56 1.89  --  2.39  HEPARINUNFRC 2.00* 0.27* 0.38  --  0.24*  CREATININE  --   --  1.22  --   --    Estimated Creatinine Clearance: 89.4 mL/min (by C-G formula based on SCr of 1.22 mg/dL).  Medical History: Past Medical History:  Diagnosis Date  . Chronic combined systolic and diastolic congestive heart failure ([place])   . COPD with chronic bronchitis ([place])   . Coronary artery disease   . Essential hypertension, benign    Ramipril to losartan Sept 2015   . History of pneumonia    RML on CXR 07/20/14  . Hyperlipidemia   . Longstanding persistent atrial fibrillation ([place])   . Mitral stenosis with regurgitation   . Myocardial infarction ([place])   . Pulmonary hypertension ([place])   . Renal disorder    history kidney stone   Assessment: 64 YOM with history of Afib on Coumadin PTA, last dose on 06/05/18 with admission INR of 1.25. Patient underwent cath 7/16 and found to have severe MV calcification requiring CVTS evaluation. IV heparin resumed post cath, coumadin resumed 7/17. TEE on 7/17 showed thrombus in atrial appendage.    Patient is planning for mitral valve replacement in ~ 2 weeks. CVTS recommends continuing warfarin given known thrombus. Pt to D/C home later today with  therapeutic INR.  INR therapeutic: 2.39. No bleeding/hematoma documented.   Home coumadin regimen 4mg  daily except 2mg  Mon.Sat.Sun.  Goal of Therapy:  INR 2-3 Monitor platelets by anticoagulation protocol: Yes   Plan:  Warfarin 3mg  x1 tonight  Monitor for s/sx of bleeding/hematoma  Georga Bora, PharmD Clinical Pharmacist 06/17/2018 1:37 PM Please check AMION for all [facility] numbers

## 2018-06-18 ENCOUNTER — Encounter: Payer: BLUE CROSS/BLUE SHIELD | Admitting: Thoracic Surgery (Cardiothoracic Vascular Surgery)

## 2018-06-19 ENCOUNTER — Telehealth: Payer: Self-pay

## 2018-06-19 ENCOUNTER — Other Ambulatory Visit: Payer: Self-pay

## 2018-06-19 DIAGNOSIS — I4819 Other persistent atrial fibrillation: Secondary | ICD-10-CM

## 2018-06-19 MED ORDER — ENOXAPARIN SODIUM 150 MG/ML ~~LOC~~ SOLN: 135.0000 mg | SUBCUTANEOUS | 0 refills | Status: DC

## 2018-06-19 NOTE — Telephone Encounter (Signed)
Lovenox 150 mg pre filled syringe RX sent to pharm, 135 mg sq daily x 9 days, Starting 07/10/18- 07/18/18/ surgery is scheduled for 07/18/18/ MVR/MAZE/TVR

## 2018-06-25 ENCOUNTER — Telehealth: Payer: Self-pay

## 2018-06-25 NOTE — Telephone Encounter (Signed)
Noted  

## 2018-06-25 NOTE — Telephone Encounter (Signed)
Nurse Stanton Kidney (case mgr from [employer]) called stating that she is enrolling pt in case management services. Pt was in the hospital for mitral valve stenosis and SOB, which makes pt high risk for re-admission These services that pt is enrolling in is to prevent future complications. She can be contacted for further details or updates at [phone].

## 2018-06-26 DIAGNOSIS — Z7901 Long term (current) use of anticoagulants: Secondary | ICD-10-CM | POA: Diagnosis not present

## 2018-07-01 DIAGNOSIS — I342 Nonrheumatic mitral (valve) stenosis: Secondary | ICD-10-CM | POA: Diagnosis not present

## 2018-07-01 DIAGNOSIS — I482 Chronic atrial fibrillation: Secondary | ICD-10-CM | POA: Diagnosis not present

## 2018-07-01 DIAGNOSIS — I340 Nonrheumatic mitral (valve) insufficiency: Secondary | ICD-10-CM | POA: Diagnosis not present

## 2018-07-01 DIAGNOSIS — I5020 Unspecified systolic (congestive) heart failure: Secondary | ICD-10-CM | POA: Diagnosis not present

## 2018-07-08 DIAGNOSIS — Z7901 Long term (current) use of anticoagulants: Secondary | ICD-10-CM | POA: Diagnosis not present

## 2018-07-14 NOTE — Pre-Procedure Instructions (Signed)
Jeffrey Larson  07/14/2018      [facility] [phone] - [place], [place] AT [place] [address] [place] [address] Phone: [phone] Fax: [phone]    Your procedure is scheduled on August 23rd, 2019.  Report to [facility] Admitting at 5:30 A.M.  Call this number if you have problems the morning of surgery:  [phone]   Remember:  Do not eat or drink after midnight.    Take these medicines the morning of surgery with A SIP OF WATER:ACETAMINOPHEN (TYLENOL), DIGOXIN (LANOXIN), FLUTICASONE  (TRELEGY ELLIPTA),  AND METOPROLOL SUCCINATE (TOPROL-XL) MORNING OF SURGERY.  TAKE IPRATROPIUM-ALBUTEROL (DUONEB) AS NEEDED.  FOLLOW SURGEONS INSTRUCTIONS CONCERNING WARFARIN AND ASPIRIN.    Do not wear jewelry, make-up or nail polish.  Do not wear lotions, powders, or perfumes, or deodorant.  Do not shave 48 hours prior to surgery.  Men may shave face and neck.  Do not bring valuables to the hospital.  [facility] is not responsible for any belongings or valuables.  Contacts, dentures or bridgework may not be worn into surgery.  Leave your suitcase in the car.  After surgery it may be brought to your room.  For patients admitted to the hospital, discharge time will be determined by your treatment team.  Patients discharged the day of surgery will not be allowed to drive home.    [facility]- Preparing For Surgery  Before surgery, you can play an important role. Because skin is not sterile, your skin needs to be as free of germs as possible. You can reduce the number of germs on your skin by washing with CHG (chlorahexidine gluconate) Soap before surgery.  CHG is an antiseptic cleaner which kills germs and bonds with the skin to continue killing germs even after washing.    Oral Hygiene is also important to reduce your risk of infection.  Remember - BRUSH YOUR TEETH THE MORNING OF SURGERY WITH YOUR REGULAR  TOOTHPASTE  Please do not use if you have an allergy to CHG or antibacterial soaps. If your skin becomes reddened/irritated stop using the CHG.  Do not shave (including legs and underarms) for at least 48 hours prior to first CHG shower. It is OK to shave your face.  Please follow these instructions carefully.   1. Shower the NIGHT BEFORE SURGERY and the MORNING OF SURGERY with CHG.   2. If you chose to wash your hair, wash your hair first as usual with your normal shampoo.  3. After you shampoo, rinse your hair and body thoroughly to remove the shampoo.  4. Use CHG as you would any other liquid soap. You can apply CHG directly to the skin and wash gently with a scrungie or a clean washcloth.   5. Apply the CHG Soap to your body ONLY FROM THE NECK DOWN.  Do not use on open wounds or open sores. Avoid contact with your eyes, ears, mouth and genitals (private parts). Wash Face and genitals (private parts)  with your normal soap.  6. Wash thoroughly, paying special attention to the area where your surgery will be performed.  7. Thoroughly rinse your body with warm water from the neck down.  8. DO NOT shower/wash with your normal soap after using and rinsing off the CHG Soap.  9. Pat yourself dry with a CLEAN TOWEL.  10. Wear CLEAN PAJAMAS to bed the night before surgery, wear comfortable clothes the morning of  surgery  11. Place CLEAN SHEETS on your bed the night of your first shower and DO NOT SLEEP WITH PETS.    Day of Surgery:  Do not apply any deodorants/lotions.  Please wear clean clothes to the hospital/surgery center.   Remember to brush your teeth WITH YOUR REGULAR TOOTHPASTE.

## 2018-07-15 ENCOUNTER — Encounter (HOSPITAL_COMMUNITY)
Admission: RE | Admit: 2018-07-15 | Discharge: 2018-07-15 | Disposition: A | Discharge status: Home / Self Care | Payer: BLUE CROSS/BLUE SHIELD | Source: Ambulatory Visit | Attending: Thoracic Surgery (Cardiothoracic Vascular Surgery) | Admitting: Thoracic Surgery (Cardiothoracic Vascular Surgery)

## 2018-07-15 ENCOUNTER — Ambulatory Visit (HOSPITAL_COMMUNITY)
Admission: RE | Admit: 2018-07-15 | Discharge: 2018-07-15 | Disposition: A | Discharge status: Home / Self Care | Payer: BLUE CROSS/BLUE SHIELD | Source: Ambulatory Visit | Attending: Thoracic Surgery (Cardiothoracic Vascular Surgery) | Admitting: Thoracic Surgery (Cardiothoracic Vascular Surgery)

## 2018-07-15 ENCOUNTER — Encounter (HOSPITAL_COMMUNITY): Payer: Self-pay

## 2018-07-15 ENCOUNTER — Other Ambulatory Visit (HOSPITAL_COMMUNITY): Payer: BLUE CROSS/BLUE SHIELD

## 2018-07-15 ENCOUNTER — Other Ambulatory Visit: Payer: Self-pay

## 2018-07-15 ENCOUNTER — Ambulatory Visit: Payer: BLUE CROSS/BLUE SHIELD | Admitting: Thoracic Surgery (Cardiothoracic Vascular Surgery)

## 2018-07-15 ENCOUNTER — Encounter: Payer: Self-pay | Admitting: Thoracic Surgery (Cardiothoracic Vascular Surgery)

## 2018-07-15 VITALS — BP 97/62 | HR 67 | Resp 16 | Ht 71.0 in | Wt 292.0 lb

## 2018-07-15 DIAGNOSIS — I700 Atherosclerosis of aorta: Secondary | ICD-10-CM | POA: Insufficient documentation

## 2018-07-15 DIAGNOSIS — I110 Hypertensive heart disease with heart failure: Secondary | ICD-10-CM | POA: Insufficient documentation

## 2018-07-15 DIAGNOSIS — J449 Chronic obstructive pulmonary disease, unspecified: Secondary | ICD-10-CM | POA: Diagnosis not present

## 2018-07-15 DIAGNOSIS — I5043 Acute on chronic combined systolic (congestive) and diastolic (congestive) heart failure: Secondary | ICD-10-CM | POA: Insufficient documentation

## 2018-07-15 DIAGNOSIS — Z79899 Other long term (current) drug therapy: Secondary | ICD-10-CM | POA: Insufficient documentation

## 2018-07-15 DIAGNOSIS — Z01818 Encounter for other preprocedural examination: Secondary | ICD-10-CM | POA: Insufficient documentation

## 2018-07-15 DIAGNOSIS — Z01812 Encounter for preprocedural laboratory examination: Secondary | ICD-10-CM | POA: Insufficient documentation

## 2018-07-15 DIAGNOSIS — I4891 Unspecified atrial fibrillation: Secondary | ICD-10-CM | POA: Diagnosis not present

## 2018-07-15 DIAGNOSIS — E785 Hyperlipidemia, unspecified: Secondary | ICD-10-CM | POA: Insufficient documentation

## 2018-07-15 DIAGNOSIS — I272 Pulmonary hypertension, unspecified: Secondary | ICD-10-CM | POA: Diagnosis not present

## 2018-07-15 DIAGNOSIS — Z7982 Long term (current) use of aspirin: Secondary | ICD-10-CM | POA: Insufficient documentation

## 2018-07-15 DIAGNOSIS — I071 Rheumatic tricuspid insufficiency: Secondary | ICD-10-CM | POA: Diagnosis not present

## 2018-07-15 DIAGNOSIS — I252 Old myocardial infarction: Secondary | ICD-10-CM | POA: Diagnosis not present

## 2018-07-15 DIAGNOSIS — E119 Type 2 diabetes mellitus without complications: Secondary | ICD-10-CM

## 2018-07-15 DIAGNOSIS — Z7901 Long term (current) use of anticoagulants: Secondary | ICD-10-CM | POA: Diagnosis not present

## 2018-07-15 DIAGNOSIS — I052 Rheumatic mitral stenosis with insufficiency: Secondary | ICD-10-CM | POA: Diagnosis not present

## 2018-07-15 DIAGNOSIS — I4819 Other persistent atrial fibrillation: Secondary | ICD-10-CM | POA: Insufficient documentation

## 2018-07-15 DIAGNOSIS — Z01810 Encounter for preprocedural cardiovascular examination: Secondary | ICD-10-CM | POA: Diagnosis not present

## 2018-07-15 DIAGNOSIS — I481 Persistent atrial fibrillation: Secondary | ICD-10-CM

## 2018-07-15 DIAGNOSIS — Z87891 Personal history of nicotine dependence: Secondary | ICD-10-CM | POA: Insufficient documentation

## 2018-07-15 DIAGNOSIS — I2510 Atherosclerotic heart disease of native coronary artery without angina pectoris: Secondary | ICD-10-CM

## 2018-07-15 DIAGNOSIS — I340 Nonrheumatic mitral (valve) insufficiency: Secondary | ICD-10-CM

## 2018-07-15 DIAGNOSIS — I4811 Longstanding persistent atrial fibrillation: Secondary | ICD-10-CM

## 2018-07-15 LAB — URINALYSIS, ROUTINE W REFLEX MICROSCOPIC
BACTERIA UA: NONE SEEN
BILIRUBIN URINE: NEGATIVE
Glucose, UA: NEGATIVE mg/dL
KETONES UR: NEGATIVE mg/dL
LEUKOCYTES UA: NEGATIVE
NITRITE: NEGATIVE
PH: 5 (ref 5.0–8.0)
PROTEIN: NEGATIVE mg/dL
Specific Gravity, Urine: 1.013 (ref 1.005–1.030)

## 2018-07-15 LAB — ABO/RH: ABO/RH(D): A POS

## 2018-07-15 LAB — BLOOD GAS, ARTERIAL
Acid-base deficit: 0.9 mmol/L (ref 0.0–2.0)
Bicarbonate: 23.2 mmol/L (ref 20.0–28.0)
Drawn by: 421801
FIO2: 21
O2 Saturation: 97.3 %
PATIENT TEMPERATURE: 98.6
PH ART: 7.408 (ref 7.350–7.450)
pCO2 arterial: 37.4 mmHg (ref 32.0–48.0)
pO2, Arterial: 97.8 mmHg (ref 83.0–108.0)

## 2018-07-15 LAB — COMPREHENSIVE METABOLIC PANEL
ALK PHOS: 70 U/L (ref 38–126)
ALT: 31 U/L (ref 0–44)
ANION GAP: 11 (ref 5–15)
AST: 26 U/L (ref 15–41)
Albumin: 3.8 g/dL (ref 3.5–5.0)
BILIRUBIN TOTAL: 1 mg/dL (ref 0.3–1.2)
BUN: 14 mg/dL (ref 8–23)
CALCIUM: 9.1 mg/dL (ref 8.9–10.3)
CO2: 20 mmol/L — ABNORMAL LOW (ref 22–32)
Chloride: 106 mmol/L (ref 98–111)
Creatinine, Ser: 1.05 mg/dL (ref 0.61–1.24)
GFR calc Af Amer: >60 mL/min (ref >60)
GFR calc non Af Amer: >60 mL/min (ref >60)
Glucose, Bld: 91 mg/dL (ref 70–99)
Potassium: 4 mmol/L (ref 3.5–5.1)
Sodium: 137 mmol/L (ref 135–145)
Total Protein: 6.9 g/dL (ref 6.5–8.1)

## 2018-07-15 LAB — CBC
HEMATOCRIT: 52.8 % — AB (ref 39.0–52.0)
HEMOGLOBIN: 17 g/dL (ref 13.0–17.0)
MCH: 28.5 pg (ref 26.0–34.0)
MCHC: 32.2 g/dL (ref 30.0–36.0)
MCV: 88.6 fL (ref 78.0–100.0)
Platelets: 245 10*3/uL (ref 150–400)
RBC: 5.96 MIL/uL — ABNORMAL HIGH (ref 4.22–5.81)
RDW: 15.3 % (ref 11.5–15.5)
WBC: 10.5 10*3/uL (ref 4.0–10.5)

## 2018-07-15 LAB — HEMOGLOBIN A1C
HEMOGLOBIN A1C: 6.1 % — AB (ref 4.8–5.6)
Mean Plasma Glucose: 128.37 mg/dL

## 2018-07-15 LAB — APTT: APTT: 28 s (ref 24–36)

## 2018-07-15 LAB — TYPE AND SCREEN
ABO/RH(D): A POS
Antibody Screen: NEGATIVE

## 2018-07-15 LAB — PROTIME-INR
INR: 1.2
PROTHROMBIN TIME: 15.1 s (ref 11.4–15.2)

## 2018-07-15 LAB — SURGICAL PCR SCREEN
MRSA, PCR: NEGATIVE
Staphylococcus aureus: NEGATIVE

## 2018-07-15 NOTE — Progress Notes (Signed)
[address]       [place] [address]             [phone]     CARDIOTHORACIC SURGERY OFFICE NOTE  Referring Provider is Dixie Dials, MD Primary Cardiologist is [name], MD   PCP is [name], DO   HPI:  Patient is a 62 year old morbidly obese male with history of coronary artery disease status post acute myocardial infarction in 2013 treated with multivessel PCI and stenting, chronic combined systolic and diastolic congestive heart failure, long-standing persistent atrial fibrillation, and COPD with chronic bronchitis who returns to the office today for follow-up of likely rheumatic heart disease with severe mitral stenosis, moderate mitral regurgitation, tricuspid regurgitation.  The patient was originally seen in consultation on June 16, 2018 during his recent hospitalization for acute exacerbation of chronic combined systolic and diastolic congestive heart failure.  At that time we made tentative plans for elective mitral valve replacement, possible tricuspid valve repair, and Maze procedure later this week.  He reports that since hospital discharge she has done quite well on his current medical regimen.  Lower extremity edema has improved considerably.  Weight is been stable.  His breathing is much better.  He now gets short of breath only with moderate level activity.  He denies any productive cough.  He has not had fevers or chills.  Appetite is stable.  He looks forward to proceeding with surgery later this week as originally planned.  He stopped taking Coumadin last week and is now on daily Lovenox injections for bridging therapy.   Current Outpatient Medications  Medication Sig Dispense Refill  . acetaminophen (TYLENOL) 500 MG tablet Take 1,000 mg by mouth 2 (two) times daily.    . Albuterol Sulfate (PROAIR RESPICLICK) 102 (90 Base) MCG/ACT AEPB Inhale 1 puff into the lungs every 6 (six) hours as needed (sob). 1 each 1  . AMBULATORY NON  FORMULARY MEDICATION Medication Name: Glucometer, lancets and strip to test every other day. Dx: Diabetes type 2, E11.9. 100 strip and 100 lancets 1 Units PRN  . aspirin 81 MG tablet Take 81 mg by mouth daily.    Marland Kitchen atorvastatin (LIPITOR) 40 MG tablet Take 40 mg by mouth every evening.   2  . digoxin (LANOXIN) 0.125 MG tablet Take 1 tablet (0.125 mg total) by mouth daily. 30 tablet 3  . enoxaparin (LOVENOX) 150 MG/ML injection Inject 0.9 mLs (135 mg total) into the skin daily for 8 days. 9 Syringe 0  . Fluticasone-Umeclidin-Vilant (TRELEGY ELLIPTA) 100-62.5-25 MCG/INH AEPB Inhale 1 puff into the lungs daily. 1 each 5  . furosemide (LASIX) 40 MG tablet Take 40-80 mg by mouth See admin instructions. Take 80 mg in the morning and 40 mg in the evening    . nitroGLYCERIN (NITROSTAT) 0.4 MG SL tablet Place 1 tablet (0.4 mg total) under the tongue every 5 (five) minutes x 3 doses as needed for chest pain. 25 tablet 3  . Omega-3 Fatty Acids (FISH OIL) 1200 MG CAPS Take 1,200 mg by mouth daily.    Marland Kitchen oxymetazoline (AFRIN) 0.05 % nasal spray Place 1 spray into both nostrils daily.    . potassium chloride SA (K-DUR,KLOR-CON) 20 MEQ tablet Take 20 mEq by mouth 2 (two) times daily.   0  . sacubitril-valsartan (ENTRESTO) 24-26 MG Take 1 tablet by mouth 2 (two) times daily.    Marland Kitchen spironolactone (ALDACTONE) 25 MG tablet Take 1 tablet (25 mg total) by mouth  daily.    . ipratropium-albuterol (DUONEB) 0.5-2.5 (3) MG/3ML SOLN Take 3 mLs by nebulization every 2 (two) hours as needed (wheeze, SOB). (Patient not taking: Reported on 07/10/2018) 60 mL 3  . metoprolol succinate (TOPROL-XL) 50 MG 24 hr tablet Take 50 mg by mouth 2 (two) times daily. Take with or immediately following a meal.    . warfarin (COUMADIN) 4 MG tablet Take 4 mg by mouth every evening.      No current facility-administered medications for this visit.       Physical Exam:   BP 97/62 (BP Location: Left Arm, Patient Position: Sitting, Cuff Size:  Large)   Pulse 67   Resp 16   Ht 5\' 11"  (1.803 m)   Wt 292 lb (132.5 kg)   SpO2 96% Comment: ON RA  BMI 40.73 kg/m   General:  Morbidly obese but well-appearing  Chest:   Few expiratory wheezes  CV:   Irregular rate and rhythm  Incisions:  n/a  Abdomen:  Soft nontender  Extremities:  Warm and well-perfused, mild lower extremity edema  Diagnostic Tests:  CT ANGIOGRAPHY CHEST, ABDOMEN AND PELVIS  TECHNIQUE: Multidetector CT imaging through the chest, abdomen and pelvis was performed using the standard protocol during bolus administration of intravenous contrast. Multiplanar reconstructed images and MIPs were obtained and reviewed to evaluate the vascular anatomy.  CONTRAST:  123mL ISOVUE-370 IOPAMIDOL (ISOVUE-370) INJECTION 76%  COMPARISON:  None.  FINDINGS: CTA CHEST FINDINGS  Cardiovascular: Mild four-chamber cardiac enlargement. No pericardial effusion. Dilated central pulmonary arteries. Satisfactory opacification of pulmonary arteries noted, and there is no evidence of pulmonary emboli. Left atrial enlargement. Incomplete opacification of left atrial appendage consistent with incomplete contrast mixing versus thrombus. Heavy mitral valve calcifications. Moderate coronary calcifications.  Adequate contrast opacification of the thoracic aorta with no evidence of dissection, aneurysm, or stenosis. There is classic 3-vessel brachiocephalic arch anatomy without proximal stenosis. Minimal aortic atheromatous plaque. No significant mural thrombus.  Mediastinum/Nodes: No hilar or mediastinal adenopathy.  Lungs/Pleura: No pleural effusion. No pneumothorax. Lungs are clear.  Musculoskeletal: Anterior vertebral endplate spurring at multiple levels in the mid and lower thoracic spine. No fracture or worrisome bone lesion.  Review of the MIP images confirms the above findings.  CTA ABDOMEN AND PELVIS FINDINGS  VASCULAR  Aorta: Minimal calcified aortic  plaque. No significant mural thrombus. No aneurysm, dissection, or stenosis.  Celiac: Patent without evidence of aneurysm, dissection, vasculitis or significant stenosis.  SMA: Patent without evidence of aneurysm, dissection, vasculitis or significant stenosis.  Renals: Both renal arteries are patent without evidence of aneurysm, dissection, vasculitis, fibromuscular dysplasia or significant stenosis.  IMA: Patent without evidence of aneurysm, dissection, vasculitis or significant stenosis.  Inflow: Minimal tortuosity. Scattered nonocclusive calcified plaque and proximal common iliac arteries and internal iliac arteries. No aneurysm, dissection, or stenosis.  Outflow: Minimal calcified plaque in bilateral common iliac arteries. Visualized proximal portions of the deep and superficial femoral arteries patent.  Veins: No obvious venous abnormality within the limitations of this arterial phase study.  Review of the MIP images confirms the above findings.  NON-VASCULAR  Hepatobiliary: No focal liver abnormality is seen. No gallstones, gallbladder wall thickening, or biliary dilatation.  Pancreas: Unremarkable. No pancreatic ductal dilatation or surrounding inflammatory changes.  Spleen: Normal in size without focal abnormality.  Adrenals/Urinary Tract: Normal adrenals. Probable 3.5 cm cyst, mid right kidney. No hydronephrosis. Urinary bladder incompletely distended.  Stomach/Bowel: Stomach and small bowel are nondilated. Appendix surgically absent. The colon is nondilated, unremarkable.  Lymphatic: No abdominal or pelvic adenopathy.  Reproductive: Prostatic enlargement with central coarse calcifications.  Other: No ascites.  No free air.  Musculoskeletal: Obesity.  No acute or significant osseous findings.  Review of the MIP images confirms the above findings.  IMPRESSION: 1. Mild aortoiliac plaque without stenosis, aneurysm,  severe tortuosity, or significant mural thrombus. 2. Thrombus versus incomplete contrast mixing in the left atrial appendage.   Electronically Signed   By: Lucrezia Europe M.D.   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  He describes a long history of gradual progression of symptoms of exertional shortness of breath and lower extremity edema consistent with chronic combined systolic and diastolic congestive heart failure, New York Heart Association functional class III.  He states that over the past 2 years or so he really has not been able to do much of anything without getting short winded.  Symptoms are likely exacerbated by comorbid medical conditions including long-standing persistent atrial fibrillation, hypertension, morbid obesity and COPD with chronic bronchitis.    At present he is doing better on his current medical regimen and he remained stable since recent hospital discharge.  I have again personally reviewed the patient's recent transesophageal echocardiogram and diagnostic cardiac catheterization.  TEE confirmed the presence of severe thickening, calcification, and restricted leaflet mobility involving both leaflets of the mitral valve.  There is severe mitral stenosis and type IIIA mitral valve dysfunction with moderate mitral regurgitation.  There is moderate left ventricular systolic dysfunction with hypokinesis of the inferior wall.  There is severe left atrial enlargement and mild right ventricular systolic dysfunction.  There was suggestion of possible clot in the tip of the left atrial appendage.  There was mild to moderate tricuspid regurgitation.  Catheterization reveals moderate nonobstructive coronary artery disease with continued patency of previous placed stents.  There is severe pulmonary hypertension.   Plan:  The patient was again counseled  at length regarding the indications, risks and potential benefits of mitral valve replacement and possible tricuspid valve repair.  The rationale for elective surgery has been explained, including a comparison between surgery and continued medical therapy with close follow-up.  We discussed the possibility of replacing the mitral valve using a mechanical prosthesis with the attendant need for long-term anticoagulation versus the alternative of replacing it using a bioprosthetic tissue valve with its potential for late structural valve deterioration and failure, depending upon the patient's longevity.  The patient has been doing well with Coumadin therapy for approximately 6 years.  The patient specifically requests that if the mitral valve must be replaced that it be done using a mechanical valve.   The relative risks and benefits of performing a maze procedure at the time of their surgery was discussed at length, including the expected likelihood of long term freedom from recurrent symptomatic atrial fibrillation and/or atrial flutter.  The patient additionally provides consent for long term follow up following surgery including participation in the [place].  The patient understands and accepts all potential risks of surgery including but not limited to risk of death, stroke or other neurologic complication, myocardial infarction, congestive heart failure, respiratory failure, renal failure, bleeding requiring transfusion and/or reexploration, arrhythmia, infection or other wound complications, pneumonia, pleural and/or pericardial effusion, pulmonary embolus, aortic dissection or other major vascular complication, or delayed complications related to valve repair or replacement including but not limited to structural valve deterioration  and failure, thrombosis, embolization, endocarditis, or paravalvular leak.  All of his questions have been answered.   I spent in excess of 15 minutes during the conduct  of this office consultation and >50% of this time involved direct face-to-face encounter with the patient for counseling and/or coordination of their care.    Valentina Gu. Roxy Manns, MD 07/15/2018 12:31 PM

## 2018-07-15 NOTE — Progress Notes (Addendum)
PCP: [name], DO  Cardiologist: Dixie Dials, MD  EKG: 06/15/18 and obtained today per order  Stress test: 04/25/18 in EPIC  ECHO: 06/16/18 in EPIC  Cardiac Cath: 06/10/18 in EPIC  Chest x-ray: obtained today per order  Pt instructed to hold coumadin beginning 07/10/18, he is on lovenox until 07/16/18 per instructions, and will continue aspirin 81 mg until the day prior to his surgery per instructions by surgeon.

## 2018-07-15 NOTE — Progress Notes (Signed)
Jeffrey Larson            07/14/2018                          [facility] [phone] - [place], [place] AT [place] [address] [place] [address] Phone: [phone] Fax: [phone]              Your procedure is scheduled on August 23rd, 2019.            Report to [facility] Admitting at 5:30 AM.            Call this number if you have problems the morning of surgery:            [phone]             Remember:            Do not eat or drink after midnight.                        Take these medicines the morning of surgery with A SIP OF WATER: METOPROLOL SUCCINATE (TOPROL-XL)  ACETAMINOPHEN (TYLENOL)- If needed DIGOXIN (LANOXIN) FLUTICASONE (TRELEGY ELLIPTA) IPRATROPIUM-ALBUTEROL (DUONEB) IF NEEDED.             FOLLOW SURGEONS INSTRUCTIONS CONCERNING WARFARIN, LOVENOX AND ASPIRIN.                        Do not wear jewelry            Do not wear lotions, powders, or colognes, or deodorant.            Men may shave face and neck.            Do not bring valuables to the hospital.            [facility] is not responsible for any belongings or valuables.  Contacts, dentures or bridgework may not be worn into surgery.  Leave your suitcase in the car.  After surgery it may be brought to your room.  For patients admitted to the hospital, discharge time will be determined by your treatment team.  Patients discharged the day of surgery will not be allowed to drive home.    [place]- Preparing For Surgery  Before surgery, you can play an important role. Because skin is not sterile, your skin needs to be as free of germs as possible. You can reduce the number of germs on your skin by washing with CHG (chlorahexidine gluconate) Soap before surgery.  CHG is an antiseptic cleaner which kills germs and bonds with the skin to continue killing germs even after washing.    Oral Hygiene is also  important to reduce your risk of infection.  Remember - BRUSH YOUR TEETH THE MORNING OF SURGERY WITH YOUR REGULAR TOOTHPASTE  Please do not use if you have an allergy to CHG or antibacterial soaps. If your skin becomes reddened/irritated stop using the CHG.  Do not shave (including legs and underarms) for at least 48 hours prior to first CHG shower. It is OK to shave your face.  Please follow these instructions carefully.  1. Shower the NIGHT BEFORE SURGERY and the MORNING OF SURGERY with CHG.   2. If you chose to wash your hair, wash your hair first as usual with your normal shampoo.  3. After you shampoo, rinse your hair and body thoroughly to remove the shampoo.  4. Use CHG as you would any other liquid soap. You can apply CHG directly to the skin and wash gently with a scrungie or a clean washcloth.   5. Apply the CHG Soap to your body ONLY FROM THE NECK DOWN.  Do not use on open wounds or open sores. Avoid contact with your eyes, ears, mouth and genitals (private parts). Wash Face and genitals (private parts)  with your normal soap.  6. Wash thoroughly, paying special attention to the area where your surgery will be performed.  7. Thoroughly rinse your body with warm water from the neck down.  8. DO NOT shower/wash with your normal soap after using and rinsing off the CHG Soap.  9. Pat yourself dry with a CLEAN TOWEL.  10. Wear CLEAN PAJAMAS to bed the night before surgery, wear comfortable clothes the morning of surgery  11. Place CLEAN SHEETS on your bed the night of your first shower and DO NOT SLEEP WITH PETS.   Day of Surgery:  Do not apply any deodorants/lotions.  Please wear clean clothes to the hospital/surgery center.   Remember to brush your teeth WITH YOUR REGULAR TOOTHPASTE.

## 2018-07-15 NOTE — Patient Instructions (Signed)
Stop taking fish oil and do not take Coumadin (warfarin)  Continue taking all other medications without change through the day before surgery.  Have nothing to eat or drink after midnight the night before surgery.  On the morning of surgery take only Toprol XL with a sip of water.  You may use your inhaler

## 2018-07-16 NOTE — Progress Notes (Signed)
Anesthesia Chart Review:  Case:  [id number] Date/Time:  07/18/18 0715   Procedures:      MITRAL VALVE (MV) REPLACEMENT (N/A Chest) - Glutataldehyde     MAZE (N/A )     TRICUSPID VALVE REPAIR (N/A Chest)     TRANSESOPHAGEAL ECHOCARDIOGRAM (TEE) (N/A )   Anesthesia type:  General   Pre-op diagnosis:      Mitral Stenosis/ Regurgitation     Atrial Fibrillation     Tricuspid Regurgitation   Location:  [facility] OR ROOM 15 / [facility]   Surgeon:  Rexene Alberts, MD      DISCUSSION: Patient is a 62 year old male scheduled for the above procedure. History includes former smoker (quit' 98), CAD (MI '13 s/p pRCA DES 06/12/12, non-obstructive CAD 06/10/18), severe mitral stenosis with moderate to severe mitral regurgitation, combined chronic systolic and diastolic CHF, afib, pulmonary hypertension, HLD, COPD.   Last warfarin dose 07/10/18. Last Lovenox dose 07/16/18.   If no acute changes then I would anticipate that he can proceed as planned.   VS: BP 119/81   Pulse 70   Temp 36.7 C (Oral)   Resp 16   Wt (!) 136.2 kg   SpO2 97%   BMI 41.89 kg/m   PROVIDERS: [name], DO is PCP [name], MD is cardiologist   LABS: Labs reviewed: Acceptable for surgery. (all labs ordered are listed, but only abnormal results are displayed)  Labs Reviewed  CBC - Abnormal; Notable for the following components:      Result Value   RBC 5.96 (*)    HCT 52.8 (*)    All other components within normal limits  COMPREHENSIVE METABOLIC PANEL - Abnormal; Notable for the following components:   CO2 20 (*)    All other components within normal limits  HEMOGLOBIN A1C - Abnormal; Notable for the following components:   Hgb A1c MFr Bld 6.1 (*)    All other components within normal limits  URINALYSIS, ROUTINE W REFLEX MICROSCOPIC - Abnormal; Notable for the following components:   Hgb urine dipstick SMALL (*)    All other components within normal limits  SURGICAL PCR SCREEN  APTT  BLOOD GAS, ARTERIAL   PROTIME-INR  TYPE AND SCREEN   PFTs 06/13/18: FVC 3.12 (63%), FEV1 1.98 (53%), DLCO unc 23.37 (69%).   IMAGES: CXR 07/15/18: IMPRESSION: COPD and smoking related interstitial changes, stable. Stable cardiomegaly without pulmonary edema. No acute cardiopulmonary abnormality. Thoracic aortic atherosclerosis.  CTA chest/abd/pelvis 06/13/18: IMPRESSION: 1. Mild aortoiliac plaque without stenosis, aneurysm, severe tortuosity, or significant mural thrombus. 2. Thrombus versus incomplete contrast mixing in the left atrial appendage.  EKG: 07/15/18: Afib 62 bpm, possible RVH, ST/T wave abnormality, consider inferior ishcemia.    CV: TEE 06/10/18: Study Conclusions - Left ventricle: Systolic function was moderately reduced. The   estimated ejection fraction was in the range of 35% to 40%.   Diffuse hypokinesis. - Aortic valve: There was mild regurgitation. - Mitral valve: Leaflet separation was severely reduced. Prolapse,   involving the middle scallop of the posterior leaflet. The   findings are consistent with severe stenosis. There was moderate   to severe regurgitation. Valve area by pressure half-time: 1.09   cm^2. - Left atrium: The atrium was moderately dilated. No evidence of   thrombus in the atrial cavity or appendage. There was a thrombus.   There was spontaneous echo contrast (&quot;smoke&quot;). - Right ventricle: Systolic function was mildly reduced. - Right atrium: No evidence of thrombus in  the atrial cavity or   appendage. - Atrial septum: No defect or patent foramen ovale was identified. - Tricuspid valve: There was moderate regurgitation directed   eccentrically. - Pulmonary arteries: Systolic pressure was mildly increased.  Cardiac cath 06/10/18:  Prox RCA lesion is 10% stenosed.  Mid RCA lesion is 50% stenosed.  Post Atrio lesion is 30% stenosed.  Ost LAD to Prox LAD lesion is 20% stenosed.  Prox Cx lesion is 20% stenosed.  Hemodynamic findings  consistent with severe pulmonary hypertension.  LV end diastolic pressure is normal. Severe MV calcification. MV area 0.84 cm2. TEE and CVTS consult for MV surgery. Life-style modification for CAD.  Carotid U/S 11/14/17: Final Interpretation: Right Carotid: There is evidence in the right ICA of a 1-39% stenosis. Left Carotid: There is evidence in the left ICA of a 1-39% stenosis. Vertebrals: Both vertebral arteries were patent with antegrade flow.   Past Medical History:  Diagnosis Date  . Chronic combined systolic and diastolic congestive heart failure ([place])   . COPD with chronic bronchitis ([place])   . Coronary artery disease   . Essential hypertension, benign    Ramipril to losartan Sept 2015   . History of pneumonia    RML on CXR 07/20/14  . Hyperlipidemia   . Longstanding persistent atrial fibrillation ([place])   . Mitral stenosis with regurgitation   . Myocardial infarction ([place])   . Pulmonary hypertension ([place])   . Renal disorder    history kidney stone    Past Surgical History:  Procedure Laterality Date  . APPENDECTOMY    . CARDIOVASCULAR STRESS TEST  03/2014   Borderline reversible ischemic changes at the apex.  Normal LV contractility/EF 52%.  . CORONARY STENT PLACEMENT  7/12013  . LEFT HEART CATHETERIZATION WITH CORONARY ANGIOGRAM N/A 06/12/2012   Procedure: LEFT HEART CATHETERIZATION WITH CORONARY ANGIOGRAM;  Surgeon: Clent Demark, MD;  Location: [facility] CATH LAB;  Service: Cardiovascular;  Laterality: N/A;  . PERCUTANEOUS CORONARY STENT INTERVENTION (PCI-S) N/A 06/17/2012   Procedure: PERCUTANEOUS CORONARY STENT INTERVENTION (PCI-S);  Surgeon: Clent Demark, MD;  Location: [facility] CATH LAB;  Service: Cardiovascular;  Laterality: N/A;  . RIGHT/LEFT HEART CATH AND CORONARY ANGIOGRAPHY N/A 06/10/2018   Procedure: RIGHT/LEFT HEART CATH AND CORONARY ANGIOGRAPHY;  Surgeon: Dixie Dials, MD;  Location: [facility] CV LAB;  Service: Cardiovascular;  Laterality: N/A;  . TEE WITHOUT  CARDIOVERSION N/A 06/10/2018   Procedure: TRANSESOPHAGEAL ECHOCARDIOGRAM (TEE) with bubble study;  Surgeon: Dixie Dials, MD;  Location: [facility] ENDOSCOPY;  Service: Cardiovascular;  Laterality: N/A;    MEDICATIONS: . acetaminophen (TYLENOL) 500 MG tablet  . Albuterol Sulfate (PROAIR RESPICLICK) 664 (90 Base) MCG/ACT AEPB  . AMBULATORY NON FORMULARY MEDICATION  . aspirin 81 MG tablet  . atorvastatin (LIPITOR) 40 MG tablet  . digoxin (LANOXIN) 0.125 MG tablet  . enoxaparin (LOVENOX) 150 MG/ML injection  . Fluticasone-Umeclidin-Vilant (TRELEGY ELLIPTA) 100-62.5-25 MCG/INH AEPB  . furosemide (LASIX) 40 MG tablet  . ipratropium-albuterol (DUONEB) 0.5-2.5 (3) MG/3ML SOLN  . metoprolol succinate (TOPROL-XL) 50 MG 24 hr tablet  . nitroGLYCERIN (NITROSTAT) 0.4 MG SL tablet  . Omega-3 Fatty Acids (FISH OIL) 1200 MG CAPS  . oxymetazoline (AFRIN) 0.05 % nasal spray  . potassium chloride SA (K-DUR,KLOR-CON) 20 MEQ tablet  . sacubitril-valsartan (ENTRESTO) 24-26 MG  . spironolactone (ALDACTONE) 25 MG tablet  . warfarin (COUMADIN) 4 MG tablet   No current facility-administered medications for this encounter.     Myra Gianotti, PA-C [facility] Short Stay Center/Anesthesiology Phone [phone])  [phone] 07/16/2018 9:31 AM

## 2018-07-17 MED ORDER — KENNESTONE BLOOD CARDIOPLEGIA VIAL
13.0000 mL | Freq: Once | Status: DC
  Filled 2018-07-17: qty 13

## 2018-07-17 MED ORDER — VANCOMYCIN HCL 1000 MG IV SOLR
INTRAVENOUS | Status: AC
  Administered 2018-07-18: 1000 mL
  Filled 2018-07-17: qty 1000

## 2018-07-17 MED ORDER — NITROGLYCERIN IN D5W 200-5 MCG/ML-% IV SOLN
2.0000 ug/min | INTRAVENOUS | Status: DC
  Filled 2018-07-17: qty 250

## 2018-07-17 MED ORDER — KENNESTONE BLOOD CARDIOPLEGIA (KBC) MANNITOL SYRINGE (20%, 32ML)
32.0000 mL | Freq: Once | INTRAVENOUS | Status: DC
  Filled 2018-07-17: qty 32

## 2018-07-17 MED ORDER — SODIUM CHLORIDE 0.9 % IV SOLN
30.0000 ug/min | INTRAVENOUS | Status: AC
  Administered 2018-07-18: 50 ug/min via INTRAVENOUS
  Filled 2018-07-17: qty 2

## 2018-07-17 MED ORDER — TRANEXAMIC ACID (OHS) PUMP PRIME SOLUTION
2.0000 mg/kg | INTRAVENOUS | Status: DC
  Filled 2018-07-17: qty 2.72

## 2018-07-17 MED ORDER — DOPAMINE-DEXTROSE 3.2-5 MG/ML-% IV SOLN
0.0000 ug/kg/min | INTRAVENOUS | Status: DC
  Filled 2018-07-17: qty 250

## 2018-07-17 MED ORDER — SODIUM CHLORIDE 0.9 % IV SOLN
INTRAVENOUS | Status: AC
  Administered 2018-07-18: 1.5 [IU]/h via INTRAVENOUS
  Filled 2018-07-17: qty 1

## 2018-07-17 MED ORDER — GLUTARALDEHYDE 0.625% SOAKING SOLUTION
TOPICAL | Status: DC
  Filled 2018-07-17: qty 50

## 2018-07-17 MED ORDER — SODIUM CHLORIDE 0.9 % IV SOLN
1.5000 g | INTRAVENOUS | Status: AC
  Administered 2018-07-18: 1.5 g via INTRAVENOUS
  Filled 2018-07-17: qty 1.5

## 2018-07-17 MED ORDER — TRANEXAMIC ACID (OHS) BOLUS VIA INFUSION
15.0000 mg/kg | INTRAVENOUS | Status: AC
  Administered 2018-07-18: 2043 mg via INTRAVENOUS
  Filled 2018-07-17: qty 2043

## 2018-07-17 MED ORDER — MAGNESIUM SULFATE 50 % IJ SOLN
40.0000 meq | INTRAMUSCULAR | Status: DC
  Filled 2018-07-17: qty 9.85

## 2018-07-17 MED ORDER — VANCOMYCIN HCL 10 G IV SOLR
1500.0000 mg | INTRAVENOUS | Status: AC
  Administered 2018-07-18: 1500 mg via INTRAVENOUS
  Filled 2018-07-17: qty 1500

## 2018-07-17 MED ORDER — MILRINONE LACTATE IN DEXTROSE 20-5 MG/100ML-% IV SOLN
0.1250 ug/kg/min | INTRAVENOUS | Status: AC
  Administered 2018-07-18: 0.375 ug/kg/min via INTRAVENOUS
  Filled 2018-07-17: qty 100

## 2018-07-17 MED ORDER — SODIUM CHLORIDE 0.9 % IV SOLN
750.0000 mg | INTRAVENOUS | Status: DC
  Filled 2018-07-17: qty 750

## 2018-07-17 MED ORDER — POTASSIUM CHLORIDE 2 MEQ/ML IV SOLN
80.0000 meq | INTRAVENOUS | Status: DC
  Filled 2018-07-17: qty 40

## 2018-07-17 MED ORDER — EPINEPHRINE PF 1 MG/ML IJ SOLN
0.0000 ug/min | INTRAVENOUS | Status: AC
  Administered 2018-07-18: 2 ug/min via INTRAVENOUS
  Filled 2018-07-17 (×2): qty 4

## 2018-07-17 MED ORDER — PLASMA-LYTE 148 IV SOLN
INTRAVENOUS | Status: DC
  Filled 2018-07-17: qty 2.5

## 2018-07-17 MED ORDER — SODIUM CHLORIDE 0.9 % IV SOLN
INTRAVENOUS | Status: DC
  Filled 2018-07-17: qty 30

## 2018-07-17 MED ORDER — GLUTARALDEHYDE 0.625% SOAKING SOLUTION
TOPICAL | Status: DC
  Filled 2018-07-17 (×2): qty 50

## 2018-07-17 MED ORDER — TRANEXAMIC ACID 1000 MG/10ML IV SOLN
1.5000 mg/kg/h | INTRAVENOUS | Status: AC
  Administered 2018-07-18: 12:00:00 via INTRAVENOUS
  Administered 2018-07-18: 1.5 mg/kg/h via INTRAVENOUS
  Filled 2018-07-17: qty 25

## 2018-07-17 MED ORDER — DEXMEDETOMIDINE HCL IN NACL 400 MCG/100ML IV SOLN
0.1000 ug/kg/h | INTRAVENOUS | Status: AC
  Administered 2018-07-18: 13:00:00 via INTRAVENOUS
  Administered 2018-07-18: 0.7 ug/kg/h via INTRAVENOUS
  Filled 2018-07-17: qty 100

## 2018-07-17 NOTE — H&P (Signed)
[address]       [place] [address]             [phone]          CARDIOTHORACIC SURGERY HISTORY AND PHYSICAL EXAM  PCP is [name], DO Referring Provider is Dixie Dials, MD Primary Cardiologist is [name], MD    Reason for consultation:  Severe mitral stenosis with mitral regurgitation  HPI:  Patient is a 62 year old morbidly obese male with history of coronary artery disease status post acute myocardial infarction in 2013 treated with multivessel PCI and stenting, chronic combined systolic and diastolic congestive heart failure, long-standing persistent atrial fibrillation, and COPD with chronic bronchitis who has been referred for surgical consultation to discuss treatment options for management of likely rheumatic heart disease with severe symptomatic mitral stenosis and mitral regurgitation.  The patient states that he first began to experience symptoms of exertional shortness of breath 8 or 9 years ago.  In 2013 he suffered an acute myocardial infarction and was treated with multivessel PCI and stenting.  He was noted to be in atrial fibrillation at that time and started on warfarin anticoagulation.  He has been followed intermittently ever since by Dr. [name].  He states that symptoms of exertional shortness of breath and lower extremity edema have gradually progressed.  Echocardiograms have documented the presence of preserved left ventricular systolic function with likely rheumatic mitral valve disease and severe mitral stenosis.  Over the past 2 to 3 years patient symptoms have limited his physical activity considerably, and recently he finds that he cannot do much of anything without getting short of breath.  He denies resting shortness of breath but he gets short of breath with very low level activity, such as just walking to his garage.  He denies history of PND, palpitations, dizzy spells, or syncope.  He has chronic lower  extremity edema and takes Lasix on a daily basis.  He denies any history of exertional chest pain or chest tightness although he reports some occasional episodes of tightness across his chest that seem to come and go without specific activities.  He was seen in follow-up recently by Dr. [name] and scheduled for elective TEE and catheterization.  Catheterization revealed moderate nonobstructive coronary artery disease with continued patency of stents placed previously in the right coronary artery in the left anterior descending coronary artery.  There was severe pulmonary hypertension.  Mean transvalvular gradient across the mitral valve was estimated 17.5 mmHg corresponding to mitral valve area calculated 0.84 cm.  TEE confirmed the presence of likely rheumatic mitral valve disease with severe mitral stenosis and moderate mitral regurgitation.  There was moderate left ventricular systolic dysfunction with ejection fraction estimated only 35 to 40%.  There appeared to be thrombus in the left atrial appendage.  Right ventricular function was mildly reduced.  There was moderate tricuspid regurgitation.  Cardiothoracic surgical consultation was requested.  Patient is married and lives in [place] with his wife.  He has 2 adult children, neither of whom live close by.  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  He has no other significant physical limitations and he specifically denies any problems with ambulation.  He admits that he has been overweight for most of his adult life and he lives a sedentary lifestyle.  He  now gets short of breath with very low level activity.  He states that he gets episodes of "bronchitis" 2 or 3 times every year during which time his breathing is "terrible".  Patient is a 62 year old morbidly obese male with  history of coronary artery disease status post acute myocardial infarction in 2013 treated with multivessel PCI and stenting, chronic combined systolic and diastolic congestive heart failure, long-standing persistent atrial fibrillation, and COPD with chronic bronchitis who returns to the office today for follow-up of likely rheumatic heart disease with severe mitral stenosis, moderate mitral regurgitation, tricuspid regurgitation.  The patient was originally seen in consultation on June 16, 2018 during his recent hospitalization for acute exacerbation of chronic combined systolic and diastolic congestive heart failure.  At that time we made tentative plans for elective mitral valve replacement, possible tricuspid valve repair, and Maze procedure later this week.  He reports that since hospital discharge she has done quite well on his current medical regimen.  Lower extremity edema has improved considerably.  Weight is been stable.  His breathing is much better.  He now gets short of breath only with moderate level activity.  He denies any productive cough.  He has not had fevers or chills.  Appetite is stable.  He looks forward to proceeding with surgery later this week as originally planned.  He stopped taking Coumadin last week and is now on daily Lovenox injections for bridging therapy.  Past Medical History:  Diagnosis Date  . Chronic combined systolic and diastolic congestive heart failure ([place])   . COPD with chronic bronchitis ([place])   . Coronary artery disease   . Essential hypertension, benign    Ramipril to losartan Sept 2015   . History of pneumonia    RML on CXR 07/20/14  . Hyperlipidemia   . Longstanding persistent atrial fibrillation ([place])   . Mitral stenosis with regurgitation   . Myocardial infarction ([place])   . Pulmonary hypertension ([place])   . Renal disorder    history kidney stone    Past Surgical History:  Procedure Laterality Date  . APPENDECTOMY    . CARDIOVASCULAR STRESS  TEST  03/2014   Borderline reversible ischemic changes at the apex.  Normal LV contractility/EF 52%.  . CORONARY STENT PLACEMENT  7/12013  . LEFT HEART CATHETERIZATION WITH CORONARY ANGIOGRAM N/A 06/12/2012   Procedure: LEFT HEART CATHETERIZATION WITH CORONARY ANGIOGRAM;  Surgeon: Clent Demark, MD;  Location: [facility] CATH LAB;  Service: Cardiovascular;  Laterality: N/A;  . PERCUTANEOUS CORONARY STENT INTERVENTION (PCI-S) N/A 06/17/2012   Procedure: PERCUTANEOUS CORONARY STENT INTERVENTION (PCI-S);  Surgeon: Clent Demark, MD;  Location: [facility] CATH LAB;  Service: Cardiovascular;  Laterality: N/A;  . RIGHT/LEFT HEART CATH AND CORONARY ANGIOGRAPHY N/A 06/10/2018   Procedure: RIGHT/LEFT HEART CATH AND CORONARY ANGIOGRAPHY;  Surgeon: Dixie Dials, MD;  Location: [facility] CV LAB;  Service: Cardiovascular;  Laterality: N/A;  . TEE WITHOUT CARDIOVERSION N/A 06/10/2018   Procedure: TRANSESOPHAGEAL ECHOCARDIOGRAM (TEE) with bubble study;  Surgeon: Dixie Dials, MD;  Location: [facility] ENDOSCOPY;  Service: Cardiovascular;  Laterality: N/A;    Family History  Problem Relation Age of Onset  . Heart disease Mother   . Heart disease Father     Social History Social History   Tobacco Use  . Smoking status: Former Smoker    Packs/day: 2.00    Years: 15.00    Pack years: 30.00    Types: Cigarettes    Last attempt to quit: 11/26/1996    Years  since quitting: 21.6  . Smokeless tobacco: Never Used  Substance Use Topics  . Alcohol use: No  . Drug use: No    Prior to Admission medications   Medication Sig Start Date End Date Taking? Authorizing Provider  acetaminophen (TYLENOL) 500 MG tablet Take 1,000 mg by mouth 2 (two) times daily.   Yes [provider]  Albuterol Sulfate (PROAIR RESPICLICK) 160 (90 Base) MCG/ACT AEPB Inhale 1 puff into the lungs every 6 (six) hours as needed (sob). 08/02/17  Yes Hali Marry, MD  aspirin 81 MG tablet Take 81 mg by mouth daily.   Yes [provider]   atorvastatin (LIPITOR) 40 MG tablet Take 40 mg by mouth every evening.  09/29/17  Yes [provider]  digoxin (LANOXIN) 0.125 MG tablet Take 1 tablet (0.125 mg total) by mouth daily. 06/18/18  Yes [name], MD  enoxaparin (LOVENOX) 150 MG/ML injection Inject 0.9 mLs (135 mg total) into the skin daily for 8 days. 07/10/18 07/18/18 Yes Rexene Alberts, MD  Fluticasone-Umeclidin-Vilant (TRELEGY ELLIPTA) 100-62.5-25 MCG/INH AEPB Inhale 1 puff into the lungs daily. 01/27/18  Yes [name], DO  furosemide (LASIX) 40 MG tablet Take 40-80 mg by mouth See admin instructions. Take 80 mg in the morning and 40 mg in the evening   Yes [provider]  metoprolol succinate (TOPROL-XL) 50 MG 24 hr tablet Take 50 mg by mouth 2 (two) times daily. Take with or immediately following a meal.   Yes [provider]  nitroGLYCERIN (NITROSTAT) 0.4 MG SL tablet Place 1 tablet (0.4 mg total) under the tongue every 5 (five) minutes x 3 doses as needed for chest pain. 06/18/12 07/15/18 Yes [name], MD  Omega-3 Fatty Acids (FISH OIL) 1200 MG CAPS Take 1,200 mg by mouth daily.   Yes [provider]  oxymetazoline (AFRIN) 0.05 % nasal spray Place 1 spray into both nostrils daily.   Yes [provider]  potassium chloride SA (K-DUR,KLOR-CON) 20 MEQ tablet Take 20 mEq by mouth 2 (two) times daily.  09/16/16  Yes [provider]  sacubitril-valsartan (ENTRESTO) 24-26 MG Take 1 tablet by mouth 2 (two) times daily.   Yes [provider]  spironolactone (ALDACTONE) 25 MG tablet Take 1 tablet (25 mg total) by mouth daily. 08/12/14  Yes Hommel, Sean, DO  warfarin (COUMADIN) 4 MG tablet Take 4 mg by mouth every evening.    Yes [provider]  AMBULATORY NON FORMULARY MEDICATION Medication Name: Glucometer, lancets and strip to test every other day. Dx: Diabetes type 2, E11.9. 100 strip and 100 lancets 08/02/17   Hali Marry, MD    ipratropium-albuterol (DUONEB) 0.5-2.5 (3) MG/3ML SOLN Take 3 mLs by nebulization every 2 (two) hours as needed (wheeze, SOB). Patient not taking: Reported on 07/10/2018 10/24/17   [name], DO    Allergies  Allergen Reactions  . Ramipril Cough    cough    Review of Systems:  General: normal appetite, decreased energy, no weight gain, no weight loss, no fever  Cardiac: no chest pain with exertion, occasional chest pain at rest, + SOB with any exertion, no resting SOB, no PND, no orthopnea, no palpitations, + arrhythmia, + atrial fibrillation, + LE edema, no dizzy spells, no syncope  Respiratory: + shortness of breath, no home oxygen, no productive cough, no dry cough, occasional bronchitis, no wheezing, no hemoptysis, no asthma, no pain with inspiration or cough, no sleep apnea, no CPAP at night  GI: no  difficulty swallowing, no reflux, no frequent heartburn, no hiatal hernia, no abdominal pain, no constipation, no diarrhea, no hematochezia, no hematemesis, no melena  GU: no dysuria, no frequency, no urinary tract infection, no hematuria, no enlarged prostate, + kidney stones, no kidney disease  Vascular: no pain suggestive of claudication, no pain in feet, no leg cramps, no varicose veins, no DVT, no non-healing foot ulcer  Neuro: no stroke, no TIA's, no seizures, no headaches, no temporary blindness one eye, no slurred speech, no peripheral neuropathy, no chronic pain, no instability of gait, no memory/cognitive dysfunction  Musculoskeletal: no arthritis , no joint swelling, no myalgias, no difficulty walking, normal mobility  Skin: no rash, no itching, no skin infections, no pressure sores or ulcerations  Psych: no anxiety, no depression, no nervousness, no unusual recent stress  Eyes: no blurry vision, no floaters, no recent vision changes, + wears glasses for reading  ENT: no hearing loss, no loose or painful teeth, no dentures, multiple missing teeth, last saw dentist 3 months  ago  Hematologic: + easy bruising, no abnormal bleeding, no clotting disorder, no frequent epistaxis  Endocrine: no diabetes, does not check CBG's at home  Physical Exam:  BP 109/78 (BP Location: Right Arm)  Pulse 89  Temp 97.8 F (36.6 C) (Oral)  Resp 18  Ht 5\' 11"  (1.803 m)  Wt 297 lb 12.8 oz (135.1 kg)  SpO2 93%  BMI 41.53 kg/m  General: Morbidly obese, well-appearing  HEENT: Unremarkable  Neck: no JVD, no bruits, no adenopathy  Chest: clear to auscultation, symmetrical breath sounds, no wheezes, no rhonchi  CV: Irregular rate and rhythm, grade II/VI systolic murmur  Abdomen: soft, non-tender, no masses  Extremities: warm, well-perfused, pulses diminished but palpable, 2/3+ lower extremity edema  Rectal/GU Deferred  Neuro: Grossly non-focal and symmetrical throughout  Skin: Clean and dry, no rashes, no breakdown  Diagnostic Tests:  Lab Results:  Recent Labs (last 2 labs)                                     BMET:  Recent Labs (last 2 labs)                                                         CBG (last 3)  Recent Labs (last 2 labs)      PT/INR:  Recent Labs (last 2 labs)                       CXR: N/A  Transthoracic Echocardiography  Patient: Gjon, Letarte  MR #: [id number]  Study Date: 10/29/2017  Gender: M  Age: 7  Height: 180.3 cm  Weight: 137.5 kg  BSA: 2.69 m^2  Pt. Status:  Room:  SONOGRAPHER [facility]  PERFORMING Ch[facility], Forestine Na  ATTENDING [name]  Jeryl Columbia, Natalie  REFERRING [name]  cc:  -------------------------------------------------------------------  LV EF: 50% - 55%  -------------------------------------------------------------------  Indications: CHF - 428.0.  -------------------------------------------------------------------  History: PMH: Former Smoker. Mitral Valve Stenosis. Atrial  fibrillation. Coronary artery disease. Chronic obstructive  pulmonary disease. PMH:  Myocardial infarction. Risk factors:  Hypertension. Diabetes mellitus. Dyslipidemia.  -------------------------------------------------------------------  Study Conclusions  - Procedure narrative: Transthoracic echocardiography. Image  quality was adequate.  Intravenous contrast (Definity) was  administered.  - Left ventricle: The cavity size was normal. Wall thickness was  increased in a pattern of mild LVH. Systolic function was normal.  The estimated ejection fraction was in the range of 50% to 55%.  Wall motion was normal; there were no regional wall motion  abnormalities. The study was not technically sufficient to allow  evaluation of LV diastolic dysfunction due to atrial  fibrillation.  - Aortic valve: Moderately calcified annulus. There was trivial  regurgitation.  - Mitral valve: Severely thickened, severely calcified leaflets .  Mobility was restricted. The findings are consistent with severe  stenosis. Mean gradient (D): 15 mm Hg. Valve area by pressure  half-time: 1.03 cm^2.  - Left atrium: The atrium was mildly dilated.  - Right ventricle: The cavity size was moderately dilated. Systolic  function was reduced.  - Right atrium: The atrium was mildly dilated.  - Atrial septum: No defect or patent foramen ovale was identified.  -------------------------------------------------------------------  Study data: Comparison was made to the study of 11/13/2016. Study  status: Routine. Procedure: Transthoracic echocardiography.  Image quality was adequate. Intravenous contrast (Definity) was  administered. Transthoracic echocardiography. M-mode,  complete 2D, spectral Doppler, and color Doppler. Birthdate:  Patient birthdate: 02-10-1956. Age: Patient is 62 yr old. Sex:  Gender: male. BMI: 42.3 kg/m^2. Blood pressure: 146/62  Patient status: Outpatient. Study date: Study date: 10/29/2017.  Study time: 01:02 PM. Location: Echo laboratory.    -------------------------------------------------------------------  -------------------------------------------------------------------  Left ventricle: The cavity size was normal. Wall thickness was  increased in a pattern of mild LVH. Systolic function was normal.  The estimated ejection fraction was in the range of 50% to 55%.  Wall motion was normal; there were no regional wall motion  abnormalities. The study was not technically sufficient to allow  evaluation of LV diastolic dysfunction due to atrial fibrillation.  -------------------------------------------------------------------  Aortic valve: Poorly visualized. Moderately calcified annulus.  Doppler: There was trivial regurgitation.  -------------------------------------------------------------------  Aorta: Aortic root: The aortic root was normal in size.  -------------------------------------------------------------------  Mitral valve: Severely thickened, severely calcified leaflets .  Mobility was restricted. Doppler: The findings are consistent  with severe stenosis. Valve area by pressure half-time: 1.03  cm^2. Indexed valve area by pressure half-time: 0.38 cm^2/m^2.  Mean gradient (D): 15 mm Hg.  -------------------------------------------------------------------  Left atrium: The atrium was mildly dilated.  -------------------------------------------------------------------  Atrial septum: No defect or patent foramen ovale was identified.  -------------------------------------------------------------------  Right ventricle: The cavity size was moderately dilated. Systolic  function was reduced.  -------------------------------------------------------------------  Pulmonic valve: Poorly visualized. Doppler: There was no  significant regurgitation.  -------------------------------------------------------------------  Tricuspid valve: Poorly visualized. Doppler: There was no  significant regurgitation.   -------------------------------------------------------------------  Right atrium: The atrium was mildly dilated.  -------------------------------------------------------------------  Pericardium: There was no pericardial effusion.  -------------------------------------------------------------------  Systemic veins:  Inferior vena cava: The vessel was normal in size. The  respirophasic diameter changes were in the normal range (>= 50%),  consistent with normal central venous pressure.  -------------------------------------------------------------------  Measurements  Left ventricle Value Reference  LV ID, ED, PLAX chordal 46.9 mm 43 - 52  LV ID, ES, PLAX chordal 33.2 mm 23 - 38  LV fx shortening, PLAX chordal 29 % >=29  LV PW thickness, ED 12.9 mm ----------  IVS/LV PW ratio, ED 0.97 <=1.3  LV e&', lateral 4.57 cm/s ----------  LV e&', medial 3.7 cm/s ----------  LV e&', average 4.14 cm/s ----------  Ventricular septum Value Reference  IVS thickness, ED 12.5 mm ----------  LVOT Value Reference  LVOT ID, S 20 mm ----------  LVOT area 3.14 cm^2 ----------  Aortic valve Value Reference  Aortic regurg pressure half-time 244 ms ----------  Aorta Value Reference  Aortic root ID, ED 31 mm ----------  Left atrium Value Reference  LA ID, A-P, ES 54 mm ----------  LA ID/bsa, A-P 2.01 cm/m^2 <=2.2  LA volume, S 96 ml ----------  LA volume/bsa, S 35.7 ml/m^2 ----------  LA volume, ES, 1-p A4C 107 ml ----------  LA volume/bsa, ES, 1-p A4C 39.8 ml/m^2 ----------  LA volume, ES, 1-p A2C 82.8 ml ----------  LA volume/bsa, ES, 1-p A2C 30.8 ml/m^2 ----------  Mitral valve Value Reference  Mitral mean velocity, D 180 cm/s ----------  Mitral pressure half-time 200 ms ----------  Mitral mean gradient, D 15 mm Hg ----------  Mitral valve area, PHT, DP 1.03 cm^2 ----------  Mitral valve area/bsa, PHT, DP 0.38 cm^2/m^2 ----------  Mitral annulus VTI, D 72.4 cm ----------  Right atrium Value  Reference  RA ID, S-I, ES, A4C (H) 66.5 mm 34 - 49  RA area, ES, A4C (H) 27 cm^2 8.3 - 19.5  RA volume, ES, A/L 81.8 ml ----------  RA volume/bsa, ES, A/L 30.4 ml/m^2 ----------  Systemic veins Value Reference  Estimated CVP 3 mm Hg ----------  Legend:  (L) and (H) mark values outside specified reference range.  -------------------------------------------------------------------  Prepared and Electronically Authenticated by  Kate Sable, MD  2018-12-04T15:34:22  RIGHT/LEFT HEART CATH AND CORONARY ANGIOGRAPHY  Conclusion  Prox RCA lesion is 10% stenosed.  Mid RCA lesion is 50% stenosed.  Post Atrio lesion is 30% stenosed.  Ost LAD to Prox LAD lesion is 20% stenosed.  Prox Cx lesion is 20% stenosed.  Hemodynamic findings consistent with severe pulmonary hypertension.  LV end diastolic pressure is normal. Severe MV calcification. MV area 0.84 cm2.  TEE and CVTS consult for MV surgery.  Life-style modification for CAD.  No indication for antiplatelet therapy at this time.   Indications  Atherosclerosis of native coronary artery of native heart without angina pectoris [I25.10 (ICD-10-CM)]  Shortness of breath [R06.02 (ICD-10-CM)]  Non-rheumatic mitral valve stenosis [I95.1 (ICD-10-CM)]  Procedural Details/Technique  Technical Details PROCEDURE: Right and Left heart catheterization with selective coronary angiography, left ventriculogram.  CLINICAL HISTORY: This is a 62 year old male with MS, atrial fibrillation and CAD with stents has exertional dyspnea.  The risks, benefits, and details of the procedure were explained to the patient. The patient verbalized understanding and wanted to proceed. Informed written consent was obtained.  PROCEDURE TECHNIQUE: The patient was approached from the right femoral vein using 7 French short sheath and right femoral artery using a 5 French short sheath. Right heart pressures, cardiac output and oxygen saturation were measured using a Swan  Ganz catheter placed through right femoral vein. Left coronary angiography was done using a Judkins L4 guide catheter. Right coronary angiography was done using a Judkins R4 guide catheter. Left ventriculography was done using a pigtail catheter.   CONTRAST: Total of 65 cc.  Total Sedation time: I was present -56 Minutes face to face 100 % of the time.   Estimated blood loss <50 mL.  During this procedure the patient was administered the following to achieve and maintain moderate conscious sedation: Versed 1 mg, Fentanyl 50 mcg, while the patient's heart rate, blood pressure, and oxygen saturation were continuously monitored. The period of conscious sedation was 56 minutes, of which I was  present face-to-face 100% of this time.  Complications  Complications documented before study signed (06/10/2018 10:12 AM EDT)   No complications were associated with this study.  Documented by Dixie Dials, MD - 06/10/2018 10:09 AM EDT  Coronary Findings  Diagnostic  Dominance: Right  Left Main  Vessel was injected. Vessel is normal in caliber.  Left Anterior Descending  Ost LAD to Prox LAD lesion 20% stenosed  Ost LAD to Prox LAD lesion is 20% stenosed. Vessel is not the culprit lesion. The lesion is irregular. The lesion was previously treated using a drug eluting stent over 2 years ago. Previously placed stent displays rethrombosis. Previously placed stent displays no restenosis. The stenosis was measured by a visual reading. Pressure wire/FFR was not performed on the lesion. IVUS was not performed.  Left Circumflex  Vessel was injected. Vessel is normal in caliber.  Prox Cx lesion 20% stenosed  Prox Cx lesion is 20% stenosed. Vessel is not the culprit lesion. The lesion is type C and irregular. The lesion was previously treated using a drug eluting stent over 2 years ago. Previously placed stent displays no rethrombosis. Previously placed stent displays restenosis. The stenosis was measured by a visual  reading. Pressure wire/FFR was not performed on the lesion. IVUS was not performed.  Right Coronary Artery  Vessel was injected. Vessel is normal in caliber.  Prox RCA lesion 10% stenosed  Prox RCA lesion is 10% stenosed. Vessel is not the culprit lesion. The lesion is type A and irregular. The lesion was previously treated using a drug eluting stent over 2 years ago. Previously placed stent displays no rethrombosis. Previously placed stent displays no restenosis. The stenosis was measured by a visual reading. Pressure wire/FFR was not performed on the lesion. IVUS was not performed.  Mid RCA lesion 50% stenosed  Mid RCA lesion is 50% stenosed. Vessel is not the culprit lesion. The lesion is type C and eccentric. The lesion was not previously treated. The stenosis was measured by a visual reading. Pressure wire/FFR was not performed on the lesion. IVUS was not performed.  Right Posterior Atrioventricular Branch  Post Atrio lesion 30% stenosed  Post Atrio lesion is 30% stenosed. Vessel is not the culprit lesion. The lesion is type C and irregular. The lesion was not previously treated. The stenosis was measured by a visual reading. Pressure wire/FFR was not performed on the lesion. IVUS was not performed.  Intervention  No interventions have been documented.  Right Heart  Right Heart Pressures Hemodynamic findings consistent with severe pulmonary hypertension. LV EDP is normal.  Right Atrium Right atrial pressure is elevated.  Wall Motion       Resting       EF 35-40 %. Severe MV calcification on fluoroscopy.      Coronary Diagrams  Diagnostic Diagram     Implants     No implant documentation for this case.  MERGE Images  Link to Procedure Log   Show images for CARDIAC CATHETERIZATION Procedure Log  Hemo Data   Most Recent Value  Fick Cardiac Output 4.21 L/min  Fick Cardiac Output Index 1.67 (L/min)/BSA  Thermal Cardiac Output 9.59 L/min  Thermal Cardiac Output Index 3.8  (L/min)/BSA  Aortic Mean Gradient 0.31 mmHg  Aortic Peak Gradient 0 mmHg  Aortic Valve Area >3.50  Aortic Value Area Index 1.39 cm2/BSA  Mitral Mean Gradient 17.47 mmHg  Mitral Peak Gradient 15 mmHg  Mitral Valve Area Index 0.33 cm2/BSA  RA A Wave -99 mmHg  RA  V Wave 20 mmHg  RA Mean 19 mmHg  RV Systolic Pressure 63 mmHg  RV Diastolic Pressure 16 mmHg  RV EDP 17 mmHg  PA Systolic Pressure 72 mmHg  PA Diastolic Pressure 42 mmHg  PA Mean 53 mmHg  PW A Wave -99 mmHg  PW V Wave 40 mmHg  PW Mean 31 mmHg  AO Systolic Pressure 96 mmHg  AO Diastolic Pressure 69 mmHg  AO Mean 80 mmHg  LV Systolic Pressure 542 mmHg  LV Diastolic Pressure 8 mmHg  LV EDP 14 mmHg  AOp Systolic Pressure 706 mmHg  AOp Diastolic Pressure 71 mmHg  AOp Mean Pressure 85 mmHg  LVp Systolic Pressure 92 mmHg  LVp Diastolic Pressure 10 mmHg  LVp EDP Pressure 16 mmHg  QP/QS 1  TPVR Index 31.73 HRUI  TSVR Index 47.9 HRUI  PVR SVR Ratio 0.34  TPVR/TSVR Ratio 0.66  Transesophageal Echocardiography  Patient: Daleon, Willinger  MR #: [id number]  Study Date: 06/10/2018  Gender: M  Age: 15  Height: 180.3 cm  Weight: 137.9 kg  BSA: 2.69 m^2  Pt. Status:  Room: 3E17C  ADMITTING Dixie Dials, MD  ATTENDING Dixie Dials, MD  ORDERING Dixie Dials, MD  PERFORMING Dixie Dials, MD  REFERRING Dixie Dials, MD  SONOGRAPHER Jannett Celestine, RDCS  cc:  -------------------------------------------------------------------  LV EF: 35% - 40%  -------------------------------------------------------------------  History: PMH: Non-rheumatic mitral stenosis evaluation. Atrial  fibrillation. Coronary artery disease. Chronic obstructive  pulmonary disease. PMH: Myocardial infarction. Risk factors:  Dyslipidemia.  -------------------------------------------------------------------  Study Conclusions  - Left ventricle: Systolic function was moderately reduced. The  estimated ejection fraction was in the range of 35% to 40%.    Diffuse hypokinesis.  - Aortic valve: There was mild regurgitation.  - Mitral valve: Leaflet separation was severely reduced. Prolapse,  involving the middle scallop of the posterior leaflet. The  findings are consistent with severe stenosis. There was moderate  to severe regurgitation. Valve area by pressure half-time: 1.09  cm^2.  - Left atrium: The atrium was moderately dilated. No evidence of  thrombus in the atrial cavity or appendage. There was a thrombus.  There was spontaneous echo contrast (&quot;smoke&quot;).  - Right ventricle: Systolic function was mildly reduced.  - Right atrium: No evidence of thrombus in the atrial cavity or  appendage.  - Atrial septum: No defect or patent foramen ovale was identified.  - Tricuspid valve: There was moderate regurgitation directed  eccentrically.  - Pulmonary arteries: Systolic pressure was mildly increased.  Recommendations: This procedure has been discussed with the  referring physician.  -------------------------------------------------------------------  Study data: Study status: Routine. Consent: The risks,  benefits, and alternatives to the procedure were explained to the  patient and informed consent was obtained. Procedure: The patient  reported no pain pre or post test. Initial setup. The patient was  brought to the laboratory. Surface ECG leads were monitored.  Sedation. Conscious sedation was administered by cardiology staff.  Transesophageal echocardiography. Topical anesthesia was obtained  using viscous lidocaine. A transesophageal probe was inserted by  the attending cardiologistwithout difficulty. Image quality was  adequate. Study completion: The patient tolerated the procedure  well. There were no complications. Diagnostic  transesophageal echocardiography. 2D and color Doppler.  Birthdate: Patient birthdate: 1956-06-21. Age: Patient is 62 yr  old. Sex: Gender: male. BMI: 42.4 kg/m^2. Blood pressure:  97/63 Patient  status: Outpatient. Study date: Study date:  06/10/2018. Study time: 03:41 PM. Location: Endoscopy.  -------------------------------------------------------------------  -------------------------------------------------------------------  Left ventricle: Systolic function was  moderately reduced. The  estimated ejection fraction was in the range of 35% to 40%. Diffuse  hypokinesis.  -------------------------------------------------------------------  Aortic valve: Trileaflet; mildly thickened, mildly calcified  leaflets. Cusp separation was normal. Doppler: There was mild  regurgitation.  -------------------------------------------------------------------  Aorta: There was mild, diffuse atheromatous plaque. There was no  evidence for dissection. Aortic root: The aortic root was not  dilated.  Ascending aorta: The ascending aorta was normal in size.  Aortic arch: The aortic arch was normal in size.  Descending aorta: The descending aorta was normal in size.  -------------------------------------------------------------------  Mitral valve: Severely thickened, severely calcified leaflets  anterior greater than posterior. Leaflet separation was severely  reduced. Prolapse, involving the middle scallop of the posterior  leaflet. Doppler: The findings are consistent with severe  stenosis. There was moderate to severe regurgitation. Valve  area by pressure half-time: 1.09 cm^2. Indexed valve area by  pressure half-time: 0.41 cm^2/m^2.  -------------------------------------------------------------------  Left atrium: The atrium was moderately dilated. No evidence of  thrombus in the atrial cavity or appendage. There was a thrombus.  There was spontaneous echo contrast (&quot;smoke&quot;). The appendage was  multilobulated and of normal size. Emptying velocity was normal.  -------------------------------------------------------------------  Atrial septum: No defect or patent foramen ovale was  identified.  -------------------------------------------------------------------  Pulmonary veins: Well visualized.  -------------------------------------------------------------------  Right ventricle: The cavity size was normal. Wall thickness was  normal. Systolic function was mildly reduced.  -------------------------------------------------------------------  Pulmonic valve: Not well visualized. Structurally normal valve.  -------------------------------------------------------------------  Tricuspid valve: Structurally normal valve. Leaflet separation  was normal. Doppler: There was moderate regurgitation directed  eccentrically.  -------------------------------------------------------------------  Pulmonary artery: The main pulmonary artery was normal-sized.  Systolic pressure was mildly increased.  -------------------------------------------------------------------  Right atrium: The atrium was normal in size. No evidence of  thrombus in the atrial cavity or appendage. The appendage was  morphologically a right appendage.  -------------------------------------------------------------------  Pericardium: There was no pericardial effusion.  -------------------------------------------------------------------  Measurements  Mitral valve Value  Mitral pressure half-time 210 ms  Mitral valve area, PHT, DP 1.09 cm^2  Mitral valve area/bsa, PHT, DP 0.41 cm^2/m^2  Legend:  (L) and (H) mark values outside specified reference range.  -------------------------------------------------------------------  Prepared and Electronically Authenticated by  Dixie Dials, MD  2019-07-17T08:58:47  CT ANGIOGRAPHY CHEST, ABDOMEN AND PELVIS  TECHNIQUE:  Multidetector CT imaging through the chest, abdomen and pelvis was  performed using the standard protocol during bolus administration of  intravenous contrast. Multiplanar reconstructed images and MIPs were  obtained and reviewed to evaluate the  vascular anatomy.  CONTRAST: 157mL ISOVUE-370 IOPAMIDOL (ISOVUE-370) INJECTION 76%  COMPARISON: None.  FINDINGS:  CTA CHEST FINDINGS  Cardiovascular: Mild four-chamber cardiac enlargement. No  pericardial effusion. Dilated central pulmonary arteries.  Satisfactory opacification of pulmonary arteries noted, and there is  no evidence of pulmonary emboli. Left atrial enlargement. Incomplete  opacification of left atrial appendage consistent with incomplete  contrast mixing versus thrombus. Heavy mitral valve calcifications.  Moderate coronary calcifications.  Adequate contrast opacification of the thoracic aorta with no  evidence of dissection, aneurysm, or stenosis. There is classic  3-vessel brachiocephalic arch anatomy without proximal stenosis.  Minimal aortic atheromatous plaque. No significant mural thrombus.  Mediastinum/Nodes: No hilar or mediastinal adenopathy.  Lungs/Pleura: No pleural effusion. No pneumothorax. Lungs are clear.  Musculoskeletal: Anterior vertebral endplate spurring at multiple  levels in the mid and lower thoracic spine. No fracture or worrisome  bone lesion.  Review of the MIP images confirms the above findings.  CTA ABDOMEN AND PELVIS FINDINGS  VASCULAR  Aorta: Minimal calcified aortic plaque. No significant mural  thrombus. No aneurysm, dissection, or stenosis.  Celiac: Patent without evidence of aneurysm, dissection, vasculitis  or significant stenosis.  SMA: Patent without evidence of aneurysm, dissection, vasculitis or  significant stenosis.  Renals: Both renal arteries are patent without evidence of aneurysm,  dissection, vasculitis, fibromuscular dysplasia or significant  stenosis.  IMA: Patent without evidence of aneurysm, dissection, vasculitis or  significant stenosis.  Inflow: Minimal tortuosity. Scattered nonocclusive calcified plaque  and proximal common iliac arteries and internal iliac arteries. No  aneurysm, dissection, or stenosis.    Outflow: Minimal calcified plaque in bilateral common iliac  arteries. Visualized proximal portions of the deep and superficial  femoral arteries patent.  Veins: No obvious venous abnormality within the limitations of this  arterial phase study.  Review of the MIP images confirms the above findings.  NON-VASCULAR  Hepatobiliary: No focal liver abnormality is seen. No gallstones,  gallbladder wall thickening, or biliary dilatation.  Pancreas: Unremarkable. No pancreatic ductal dilatation or  surrounding inflammatory changes.  Spleen: Normal in size without focal abnormality.  Adrenals/Urinary Tract: Normal adrenals. Probable 3.5 cm cyst, mid  right kidney. No hydronephrosis. Urinary bladder incompletely  distended.  Stomach/Bowel: Stomach and small bowel are nondilated. Appendix  surgically absent. The colon is nondilated, unremarkable.  Lymphatic: No abdominal or pelvic adenopathy.  Reproductive: Prostatic enlargement with central coarse  calcifications.  Other: No ascites. No free air.  Musculoskeletal: Obesity. No acute or significant osseous findings.  Review of the MIP images confirms the above findings.  IMPRESSION:  1. Mild aortoiliac plaque without stenosis, aneurysm, severe  tortuosity, or significant mural thrombus.  2. Thrombus versus incomplete contrast mixing in the left atrial  appendage.  Electronically Signed  By: Lucrezia Europe M.D.  On: 06/13/2018 17:16    CT ANGIOGRAPHY CHEST, ABDOMEN AND PELVIS  TECHNIQUE: Multidetector CT imaging through the chest, abdomen and pelvis was performed using the standard protocol during bolus administration of intravenous contrast. Multiplanar reconstructed images and MIPs were obtained and reviewed to evaluate the vascular anatomy.  CONTRAST: 110mL ISOVUE-370 IOPAMIDOL (ISOVUE-370) INJECTION 76%  COMPARISON: None.  FINDINGS: CTA CHEST FINDINGS  Cardiovascular: Mild four-chamber cardiac enlargement. No pericardial  effusion. Dilated central pulmonary arteries. Satisfactory opacification of pulmonary arteries noted, and there is no evidence of pulmonary emboli. Left atrial enlargement. Incomplete opacification of left atrial appendage consistent with incomplete contrast mixing versus thrombus. Heavy mitral valve calcifications. Moderate coronary calcifications.  Adequate contrast opacification of the thoracic aorta with no evidence of dissection, aneurysm, or stenosis. There is classic 3-vessel brachiocephalic arch anatomy without proximal stenosis. Minimal aortic atheromatous plaque. No significant mural thrombus.  Mediastinum/Nodes: No hilar or mediastinal adenopathy.  Lungs/Pleura: No pleural effusion. No pneumothorax. Lungs are clear.  Musculoskeletal: Anterior vertebral endplate spurring at multiple levels in the mid and lower thoracic spine. No fracture or worrisome bone lesion.  Review of the MIP images confirms the above findings.  CTA ABDOMEN AND PELVIS FINDINGS  VASCULAR  Aorta: Minimal calcified aortic plaque. No significant mural thrombus. No aneurysm, dissection, or stenosis.  Celiac: Patent without evidence of aneurysm, dissection, vasculitis or significant stenosis.  SMA: Patent without evidence of aneurysm, dissection, vasculitis or significant stenosis.  Renals: Both renal arteries are patent without evidence of aneurysm, dissection, vasculitis, fibromuscular dysplasia or significant stenosis.  IMA: Patent without evidence of aneurysm, dissection, vasculitis or significant stenosis.  Inflow: Minimal tortuosity. Scattered nonocclusive calcified plaque and proximal common  iliac arteries and internal iliac arteries. No aneurysm, dissection, or stenosis.  Outflow: Minimal calcified plaque in bilateral common iliac arteries. Visualized proximal portions of the deep and superficial femoral arteries patent.  Veins: No obvious venous abnormality within  the limitations of this arterial phase study.  Review of the MIP images confirms the above findings.  NON-VASCULAR  Hepatobiliary: No focal liver abnormality is seen. No gallstones, gallbladder wall thickening, or biliary dilatation.  Pancreas: Unremarkable. No pancreatic ductal dilatation or surrounding inflammatory changes.  Spleen: Normal in size without focal abnormality.  Adrenals/Urinary Tract: Normal adrenals. Probable 3.5 cm cyst, mid right kidney. No hydronephrosis. Urinary bladder incompletely distended.  Stomach/Bowel: Stomach and small bowel are nondilated. Appendix surgically absent. The colon is nondilated, unremarkable.  Lymphatic: No abdominal or pelvic adenopathy.  Reproductive: Prostatic enlargement with central coarse calcifications.  Other: No ascites. No free air.  Musculoskeletal: Obesity. No acute or significant osseous findings.  Review of the MIP images confirms the above findings.  IMPRESSION: 1. Mild aortoiliac plaque without stenosis, aneurysm, severe tortuosity, or significant mural thrombus. 2. Thrombus versus incomplete contrast mixing in the left atrial appendage.   Electronically Signed By: Lucrezia Europe M.D. 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 He describes a long history of gradual progression of symptoms of exertional shortness of breath and lower extremity edema consistent with chronic combined systolic and diastolic congestive heart failure, New York Heart Association functional class III.He states that over the past 2 years or so he really has not been able to do much of anything without getting short winded. Symptoms are likely exacerbated by comorbid medical conditions including long-standing persistent atrial fibrillation, hypertension, morbid obesity and COPD with  chronic bronchitis.   At present he is doing better on his current medical regimen and he remained stable since recent hospital discharge.  I have again personally reviewed the patient's recent transesophageal echocardiogram and diagnostic cardiac catheterization. TEE confirmed the presence of severe thickening, calcification, and restricted leaflet mobility involving both leaflets of the mitral valve. There is severe mitral stenosis and type IIIAmitral valve dysfunction with moderate mitral regurgitation. There is moderate left ventricular systolic dysfunction with hypokinesis of the inferior wall. There is severe left atrial enlargement and mild right ventricular systolic dysfunction. There was suggestion of possible clot in the tip of the left atrial appendage. There was mild to moderate tricuspid regurgitation. Catheterization reveals moderate nonobstructive coronary artery disease with continued patency of previous placed stents. There is severe pulmonary hypertension.   Plan:  The patient was again counseled at length regarding the indications, risks and potential benefits of mitral valve replacement and possible tricuspid valve repair.  The rationale for elective surgery has been explained, including a comparison between surgery and continued medical therapy with close follow-up.  We discussed the possibility of replacing the mitral valve using a mechanical prosthesis with the attendant need for long-term anticoagulation versus the alternative of replacing it using a bioprosthetic tissue valve with its potential for late structural valve deterioration and failure, depending upon the patient's longevity.  The patient has been doing well with Coumadin therapy for approximately 6 years.  The patient specifically requests that if the mitral valve must be replaced that it be done using a mechanical valve.   The relative risks and benefits of performing a maze procedure at the time of their  surgery was discussed at length, including the  expected likelihood of long term freedom from recurrent symptomatic atrial fibrillation and/or atrial flutter.  The patient additionally provides consent for long term follow up following surgery including participation in the [place].  The patient understands and accepts all potential risks of surgery including but not limited to risk of death, stroke or other neurologic complication, myocardial infarction, congestive heart failure, respiratory failure, renal failure, bleeding requiring transfusion and/or reexploration, arrhythmia, infection or other wound complications, pneumonia, pleural and/or pericardial effusion, pulmonary embolus, aortic dissection or other major vascular complication, or delayed complications related to valve repair or replacement including but not limited to structural valve deterioration and failure, thrombosis, embolization, endocarditis, or paravalvular leak.  All of his questions have been answered.     Valentina Gu. Roxy Manns, MD 07/15/2018 12:31 PM

## 2018-07-18 ENCOUNTER — Other Ambulatory Visit: Payer: Self-pay | Admitting: *Deleted

## 2018-07-18 ENCOUNTER — Inpatient Hospital Stay (HOSPITAL_COMMUNITY): Payer: BLUE CROSS/BLUE SHIELD | Admitting: Certified Registered"

## 2018-07-18 ENCOUNTER — Inpatient Hospital Stay (HOSPITAL_COMMUNITY): Payer: BLUE CROSS/BLUE SHIELD

## 2018-07-18 ENCOUNTER — Encounter (HOSPITAL_COMMUNITY)
Admission: RE | Disposition: A | Discharge status: Home Health | Payer: Self-pay | Source: Home / Self Care | Attending: Thoracic Surgery (Cardiothoracic Vascular Surgery)

## 2018-07-18 ENCOUNTER — Inpatient Hospital Stay (HOSPITAL_COMMUNITY)
Admission: RE | Admit: 2018-07-18 | Discharge: 2018-07-31 | DRG: 219 | Disposition: A | Discharge status: Home Health | Payer: BLUE CROSS/BLUE SHIELD | Attending: Thoracic Surgery (Cardiothoracic Vascular Surgery) | Admitting: Thoracic Surgery (Cardiothoracic Vascular Surgery)

## 2018-07-18 ENCOUNTER — Inpatient Hospital Stay (HOSPITAL_COMMUNITY): Payer: BLUE CROSS/BLUE SHIELD | Admitting: Vascular Surgery

## 2018-07-18 ENCOUNTER — Encounter (HOSPITAL_COMMUNITY): Payer: Self-pay | Admitting: *Deleted

## 2018-07-18 DIAGNOSIS — I110 Hypertensive heart disease with heart failure: Secondary | ICD-10-CM | POA: Diagnosis present

## 2018-07-18 DIAGNOSIS — G4733 Obstructive sleep apnea (adult) (pediatric): Secondary | ICD-10-CM | POA: Diagnosis present

## 2018-07-18 DIAGNOSIS — E119 Type 2 diabetes mellitus without complications: Secondary | ICD-10-CM | POA: Diagnosis present

## 2018-07-18 DIAGNOSIS — Z79899 Other long term (current) drug therapy: Secondary | ICD-10-CM

## 2018-07-18 DIAGNOSIS — Z7951 Long term (current) use of inhaled steroids: Secondary | ICD-10-CM | POA: Diagnosis not present

## 2018-07-18 DIAGNOSIS — I4819 Other persistent atrial fibrillation: Secondary | ICD-10-CM | POA: Diagnosis present

## 2018-07-18 DIAGNOSIS — I481 Persistent atrial fibrillation: Secondary | ICD-10-CM | POA: Diagnosis present

## 2018-07-18 DIAGNOSIS — Z8679 Personal history of other diseases of the circulatory system: Secondary | ICD-10-CM

## 2018-07-18 DIAGNOSIS — Z452 Encounter for adjustment and management of vascular access device: Secondary | ICD-10-CM

## 2018-07-18 DIAGNOSIS — E785 Hyperlipidemia, unspecified: Secondary | ICD-10-CM | POA: Diagnosis present

## 2018-07-18 DIAGNOSIS — Y838 Other surgical procedures as the cause of abnormal reaction of the patient, or of later complication, without mention of misadventure at the time of the procedure: Secondary | ICD-10-CM | POA: Diagnosis not present

## 2018-07-18 DIAGNOSIS — Z9689 Presence of other specified functional implants: Secondary | ICD-10-CM

## 2018-07-18 DIAGNOSIS — Z952 Presence of prosthetic heart valve: Secondary | ICD-10-CM

## 2018-07-18 DIAGNOSIS — J95811 Postprocedural pneumothorax: Secondary | ICD-10-CM | POA: Diagnosis not present

## 2018-07-18 DIAGNOSIS — I071 Rheumatic tricuspid insufficiency: Secondary | ICD-10-CM | POA: Diagnosis present

## 2018-07-18 DIAGNOSIS — I361 Nonrheumatic tricuspid (valve) insufficiency: Secondary | ICD-10-CM | POA: Diagnosis not present

## 2018-07-18 DIAGNOSIS — I342 Nonrheumatic mitral (valve) stenosis: Secondary | ICD-10-CM | POA: Diagnosis not present

## 2018-07-18 DIAGNOSIS — Z7901 Long term (current) use of anticoagulants: Secondary | ICD-10-CM

## 2018-07-18 DIAGNOSIS — Z6841 Body Mass Index (BMI) 40.0 and over, adult: Secondary | ICD-10-CM | POA: Diagnosis not present

## 2018-07-18 DIAGNOSIS — D62 Acute posthemorrhagic anemia: Secondary | ICD-10-CM | POA: Diagnosis not present

## 2018-07-18 DIAGNOSIS — I2510 Atherosclerotic heart disease of native coronary artery without angina pectoris: Secondary | ICD-10-CM | POA: Diagnosis present

## 2018-07-18 DIAGNOSIS — J9311 Primary spontaneous pneumothorax: Secondary | ICD-10-CM | POA: Diagnosis not present

## 2018-07-18 DIAGNOSIS — Z006 Encounter for examination for normal comparison and control in clinical research program: Secondary | ICD-10-CM | POA: Diagnosis not present

## 2018-07-18 DIAGNOSIS — Z4682 Encounter for fitting and adjustment of non-vascular catheter: Secondary | ICD-10-CM | POA: Diagnosis not present

## 2018-07-18 DIAGNOSIS — I10 Essential (primary) hypertension: Secondary | ICD-10-CM | POA: Diagnosis present

## 2018-07-18 DIAGNOSIS — I083 Combined rheumatic disorders of mitral, aortic and tricuspid valves: Secondary | ICD-10-CM | POA: Diagnosis not present

## 2018-07-18 DIAGNOSIS — Z955 Presence of coronary angioplasty implant and graft: Secondary | ICD-10-CM

## 2018-07-18 DIAGNOSIS — I5043 Acute on chronic combined systolic (congestive) and diastolic (congestive) heart failure: Secondary | ICD-10-CM | POA: Diagnosis not present

## 2018-07-18 DIAGNOSIS — I5042 Chronic combined systolic (congestive) and diastolic (congestive) heart failure: Secondary | ICD-10-CM | POA: Diagnosis present

## 2018-07-18 DIAGNOSIS — I252 Old myocardial infarction: Secondary | ICD-10-CM | POA: Diagnosis not present

## 2018-07-18 DIAGNOSIS — T502X5A Adverse effect of carbonic-anhydrase inhibitors, benzothiadiazides and other diuretics, initial encounter: Secondary | ICD-10-CM | POA: Diagnosis not present

## 2018-07-18 DIAGNOSIS — I482 Chronic atrial fibrillation: Secondary | ICD-10-CM | POA: Diagnosis not present

## 2018-07-18 DIAGNOSIS — E875 Hyperkalemia: Secondary | ICD-10-CM | POA: Diagnosis not present

## 2018-07-18 DIAGNOSIS — Z7982 Long term (current) use of aspirin: Secondary | ICD-10-CM | POA: Diagnosis not present

## 2018-07-18 DIAGNOSIS — J449 Chronic obstructive pulmonary disease, unspecified: Secondary | ICD-10-CM | POA: Diagnosis present

## 2018-07-18 DIAGNOSIS — I059 Rheumatic mitral valve disease, unspecified: Secondary | ICD-10-CM | POA: Diagnosis not present

## 2018-07-18 DIAGNOSIS — Z954 Presence of other heart-valve replacement: Secondary | ICD-10-CM | POA: Diagnosis not present

## 2018-07-18 DIAGNOSIS — J9811 Atelectasis: Secondary | ICD-10-CM | POA: Diagnosis not present

## 2018-07-18 DIAGNOSIS — I2729 Other secondary pulmonary hypertension: Secondary | ICD-10-CM | POA: Diagnosis present

## 2018-07-18 DIAGNOSIS — I509 Heart failure, unspecified: Secondary | ICD-10-CM | POA: Diagnosis not present

## 2018-07-18 DIAGNOSIS — L8990 Pressure ulcer of unspecified site, unspecified stage: Secondary | ICD-10-CM

## 2018-07-18 DIAGNOSIS — I052 Rheumatic mitral stenosis with insufficiency: Secondary | ICD-10-CM | POA: Diagnosis present

## 2018-07-18 DIAGNOSIS — I340 Nonrheumatic mitral (valve) insufficiency: Secondary | ICD-10-CM

## 2018-07-18 DIAGNOSIS — Z8249 Family history of ischemic heart disease and other diseases of the circulatory system: Secondary | ICD-10-CM | POA: Diagnosis not present

## 2018-07-18 DIAGNOSIS — I272 Pulmonary hypertension, unspecified: Secondary | ICD-10-CM | POA: Diagnosis present

## 2018-07-18 DIAGNOSIS — J90 Pleural effusion, not elsewhere classified: Secondary | ICD-10-CM

## 2018-07-18 DIAGNOSIS — R0602 Shortness of breath: Secondary | ICD-10-CM | POA: Diagnosis not present

## 2018-07-18 DIAGNOSIS — I081 Rheumatic disorders of both mitral and tricuspid valves: Secondary | ICD-10-CM | POA: Diagnosis not present

## 2018-07-18 DIAGNOSIS — Z87891 Personal history of nicotine dependence: Secondary | ICD-10-CM

## 2018-07-18 DIAGNOSIS — Z9889 Other specified postprocedural states: Secondary | ICD-10-CM

## 2018-07-18 DIAGNOSIS — R911 Solitary pulmonary nodule: Secondary | ICD-10-CM

## 2018-07-18 DIAGNOSIS — Z23 Encounter for immunization: Secondary | ICD-10-CM

## 2018-07-18 DIAGNOSIS — J939 Pneumothorax, unspecified: Secondary | ICD-10-CM

## 2018-07-18 DIAGNOSIS — I4891 Unspecified atrial fibrillation: Secondary | ICD-10-CM | POA: Diagnosis not present

## 2018-07-18 DIAGNOSIS — I4811 Longstanding persistent atrial fibrillation: Secondary | ICD-10-CM | POA: Diagnosis present

## 2018-07-18 DIAGNOSIS — E876 Hypokalemia: Secondary | ICD-10-CM | POA: Diagnosis not present

## 2018-07-18 HISTORY — DX: Personal history of other diseases of the circulatory system: Z86.79

## 2018-07-18 HISTORY — PX: TRICUSPID VALVE REPLACEMENT: SHX816

## 2018-07-18 HISTORY — PX: TEE WITHOUT CARDIOVERSION: SHX5443

## 2018-07-18 HISTORY — PX: MAZE: SHX5063

## 2018-07-18 HISTORY — PX: MITRAL VALVE REPLACEMENT: SHX147

## 2018-07-18 HISTORY — DX: Presence of other heart-valve replacement: Z95.4

## 2018-07-18 HISTORY — DX: Other specified postprocedural states: Z98.890

## 2018-07-18 LAB — POCT I-STAT, CHEM 8
BUN: 18 mg/dL (ref 8–23)
BUN: 14 mg/dL (ref 8–23)
BUN: 16 mg/dL (ref 8–23)
BUN: 17 mg/dL (ref 8–23)
BUN: 18 mg/dL (ref 8–23)
BUN: 18 mg/dL (ref 8–23)
CALCIUM ION: 1.04 mmol/L — AB (ref 1.15–1.40)
CALCIUM ION: 1.17 mmol/L (ref 1.15–1.40)
CALCIUM ION: 1.08 mmol/L — AB (ref 1.15–1.40)
CHLORIDE: 101 mmol/L (ref 98–111)
CHLORIDE: 103 mmol/L (ref 98–111)
CREATININE: 1 mg/dL (ref 0.61–1.24)
CREATININE: 0.8 mg/dL (ref 0.61–1.24)
CREATININE: 0.9 mg/dL (ref 0.61–1.24)
CREATININE: 0.9 mg/dL (ref 0.61–1.24)
Calcium, Ion: 1.21 mmol/L (ref 1.15–1.40)
Calcium, Ion: 1.08 mmol/L — ABNORMAL LOW (ref 1.15–1.40)
Calcium, Ion: 1.07 mmol/L — ABNORMAL LOW (ref 1.15–1.40)
Chloride: 101 mmol/L (ref 98–111)
Chloride: 101 mmol/L (ref 98–111)
Chloride: 104 mmol/L (ref 98–111)
Chloride: 105 mmol/L (ref 98–111)
Creatinine, Ser: 1 mg/dL (ref 0.61–1.24)
Creatinine, Ser: 1 mg/dL (ref 0.61–1.24)
GLUCOSE: 165 mg/dL — AB (ref 70–99)
GLUCOSE: 165 mg/dL — AB (ref 70–99)
Glucose, Bld: 137 mg/dL — ABNORMAL HIGH (ref 70–99)
Glucose, Bld: 140 mg/dL — ABNORMAL HIGH (ref 70–99)
Glucose, Bld: 146 mg/dL — ABNORMAL HIGH (ref 70–99)
Glucose, Bld: 170 mg/dL — ABNORMAL HIGH (ref 70–99)
HCT: 39 % (ref 39.0–52.0)
HCT: 45 % (ref 39.0–52.0)
HCT: 38 % — ABNORMAL LOW (ref 39.0–52.0)
HCT: 37 % — ABNORMAL LOW (ref 39.0–52.0)
HEMATOCRIT: 47 % (ref 39.0–52.0)
HEMATOCRIT: 40 % (ref 39.0–52.0)
HEMOGLOBIN: 13.3 g/dL (ref 13.0–17.0)
HEMOGLOBIN: 12.9 g/dL — AB (ref 13.0–17.0)
HEMOGLOBIN: 12.6 g/dL — AB (ref 13.0–17.0)
HEMOGLOBIN: 13.6 g/dL (ref 13.0–17.0)
Hemoglobin: 16 g/dL (ref 13.0–17.0)
Hemoglobin: 15.3 g/dL (ref 13.0–17.0)
POTASSIUM: 4.2 mmol/L (ref 3.5–5.1)
Potassium: 4.5 mmol/L (ref 3.5–5.1)
Potassium: 4.4 mmol/L (ref 3.5–5.1)
Potassium: 4.4 mmol/L (ref 3.5–5.1)
Potassium: 4.9 mmol/L (ref 3.5–5.1)
Potassium: 5.6 mmol/L — ABNORMAL HIGH (ref 3.5–5.1)
SODIUM: 139 mmol/L (ref 135–145)
SODIUM: 139 mmol/L (ref 135–145)
SODIUM: 137 mmol/L (ref 135–145)
SODIUM: 140 mmol/L (ref 135–145)
Sodium: 139 mmol/L (ref 135–145)
Sodium: 138 mmol/L (ref 135–145)
TCO2: 28 mmol/L (ref 22–32)
TCO2: 26 mmol/L (ref 22–32)
TCO2: 26 mmol/L (ref 22–32)
TCO2: 30 mmol/L (ref 22–32)
TCO2: 25 mmol/L (ref 22–32)
TCO2: 25 mmol/L (ref 22–32)

## 2018-07-18 LAB — POCT I-STAT 3, ART BLOOD GAS (G3+)
ACID-BASE DEFICIT: 8 mmol/L — AB (ref 0.0–2.0)
ACID-BASE DEFICIT: 8 mmol/L — AB (ref 0.0–2.0)
Acid-base deficit: 2 mmol/L (ref 0.0–2.0)
Acid-base deficit: 1 mmol/L (ref 0.0–2.0)
Acid-base deficit: 6 mmol/L — ABNORMAL HIGH (ref 0.0–2.0)
Acid-base deficit: 8 mmol/L — ABNORMAL HIGH (ref 0.0–2.0)
BICARBONATE: 18.6 mmol/L — AB (ref 20.0–28.0)
BICARBONATE: 17.2 mmol/L — AB (ref 20.0–28.0)
Bicarbonate: 26.9 mmol/L (ref 20.0–28.0)
Bicarbonate: 25.2 mmol/L (ref 20.0–28.0)
Bicarbonate: 24.6 mmol/L (ref 20.0–28.0)
Bicarbonate: 22.1 mmol/L (ref 20.0–28.0)
Bicarbonate: 18.4 mmol/L — ABNORMAL LOW (ref 20.0–28.0)
O2 SAT: 100 %
O2 SAT: 100 %
O2 SAT: 92 %
O2 SAT: 98 %
O2 SAT: 98 %
O2 Saturation: 100 %
O2 Saturation: 100 %
PCO2 ART: 59.8 mmHg — AB (ref 32.0–48.0)
PCO2 ART: 43.2 mmHg (ref 32.0–48.0)
PCO2 ART: 42 mmHg (ref 32.0–48.0)
PH ART: 7.339 — AB (ref 7.350–7.450)
PH ART: 7.253 — AB (ref 7.350–7.450)
PH ART: 7.26 — AB (ref 7.350–7.450)
PH ART: 7.288 — AB (ref 7.350–7.450)
PO2 ART: 396 mmHg — AB (ref 83.0–108.0)
PO2 ART: 79 mmHg — AB (ref 83.0–108.0)
PO2 ART: 124 mmHg — AB (ref 83.0–108.0)
PO2 ART: 126 mmHg — AB (ref 83.0–108.0)
Patient temperature: 37.5
Patient temperature: 37.7
Patient temperature: 37.9
TCO2: 29 mmol/L (ref 22–32)
TCO2: 27 mmol/L (ref 22–32)
TCO2: 26 mmol/L (ref 22–32)
TCO2: 24 mmol/L (ref 22–32)
TCO2: 20 mmol/L — AB (ref 22–32)
TCO2: 20 mmol/L — AB (ref 22–32)
TCO2: 18 mmol/L — ABNORMAL LOW (ref 22–32)
pCO2 arterial: 45.6 mmHg (ref 32.0–48.0)
pCO2 arterial: 53.1 mmHg — ABNORMAL HIGH (ref 32.0–48.0)
pCO2 arterial: 42 mmHg (ref 32.0–48.0)
pCO2 arterial: 36.2 mmHg (ref 32.0–48.0)
pH, Arterial: 7.26 — ABNORMAL LOW (ref 7.350–7.450)
pH, Arterial: 7.375 (ref 7.350–7.450)
pH, Arterial: 7.23 — ABNORMAL LOW (ref 7.350–7.450)
pO2, Arterial: 398 mmHg — ABNORMAL HIGH (ref 83.0–108.0)
pO2, Arterial: 352 mmHg — ABNORMAL HIGH (ref 83.0–108.0)
pO2, Arterial: 232 mmHg — ABNORMAL HIGH (ref 83.0–108.0)

## 2018-07-18 LAB — POCT I-STAT 3, VENOUS BLOOD GAS (G3P V)
Acid-base deficit: 3 mmol/L — ABNORMAL HIGH (ref 0.0–2.0)
Bicarbonate: 24 mmol/L (ref 20.0–28.0)
O2 Saturation: 80 %
PCO2 VEN: 48.6 mmHg (ref 44.0–60.0)
PH VEN: 7.302 (ref 7.250–7.430)
PO2 VEN: 49 mmHg — AB (ref 32.0–45.0)
TCO2: 25 mmol/L (ref 22–32)

## 2018-07-18 LAB — POCT I-STAT 4, (NA,K, GLUC, HGB,HCT)
Glucose, Bld: 138 mg/dL — ABNORMAL HIGH (ref 70–99)
HCT: 42 % (ref 39.0–52.0)
Hemoglobin: 14.3 g/dL (ref 13.0–17.0)
Potassium: 4.2 mmol/L (ref 3.5–5.1)
Sodium: 142 mmol/L (ref 135–145)

## 2018-07-18 LAB — CBC
HEMATOCRIT: 46 % (ref 39.0–52.0)
HEMATOCRIT: 42.4 % (ref 39.0–52.0)
HEMOGLOBIN: 14.5 g/dL (ref 13.0–17.0)
Hemoglobin: 13.4 g/dL (ref 13.0–17.0)
MCH: 28.2 pg (ref 26.0–34.0)
MCH: 28.3 pg (ref 26.0–34.0)
MCHC: 31.5 g/dL (ref 30.0–36.0)
MCHC: 31.6 g/dL (ref 30.0–36.0)
MCV: 89.5 fL (ref 78.0–100.0)
MCV: 89.5 fL (ref 78.0–100.0)
PLATELETS: 171 10*3/uL (ref 150–400)
Platelets: 147 10*3/uL — ABNORMAL LOW (ref 150–400)
RBC: 5.14 MIL/uL (ref 4.22–5.81)
RBC: 4.74 MIL/uL (ref 4.22–5.81)
RDW: 15.5 % (ref 11.5–15.5)
RDW: 15.4 % (ref 11.5–15.5)
WBC: 26.7 10*3/uL — AB (ref 4.0–10.5)
WBC: 25.3 10*3/uL — AB (ref 4.0–10.5)

## 2018-07-18 LAB — GLUCOSE, CAPILLARY
GLUCOSE-CAPILLARY: 135 mg/dL — AB (ref 70–99)
GLUCOSE-CAPILLARY: 167 mg/dL — AB (ref 70–99)
GLUCOSE-CAPILLARY: 179 mg/dL — AB (ref 70–99)
Glucose-Capillary: 174 mg/dL — ABNORMAL HIGH (ref 70–99)
Glucose-Capillary: 188 mg/dL — ABNORMAL HIGH (ref 70–99)
Glucose-Capillary: 189 mg/dL — ABNORMAL HIGH (ref 70–99)

## 2018-07-18 LAB — CREATININE, SERUM
Creatinine, Ser: 1.44 mg/dL — ABNORMAL HIGH (ref 0.61–1.24)
GFR, EST AFRICAN AMERICAN: 59 mL/min — AB (ref >60)
GFR, EST NON AFRICAN AMERICAN: 51 mL/min — AB (ref >60)

## 2018-07-18 LAB — HEMOGLOBIN AND HEMATOCRIT, BLOOD
HCT: 40.6 % (ref 39.0–52.0)
Hemoglobin: 13.5 g/dL (ref 13.0–17.0)

## 2018-07-18 LAB — PROTIME-INR
INR: 1.46
Prothrombin Time: 17.6 seconds — ABNORMAL HIGH (ref 11.4–15.2)

## 2018-07-18 LAB — APTT: aPTT: 29 seconds (ref 24–36)

## 2018-07-18 LAB — PLATELET COUNT: PLATELETS: 145 10*3/uL — AB (ref 150–400)

## 2018-07-18 LAB — MAGNESIUM: Magnesium: 3.1 mg/dL — ABNORMAL HIGH (ref 1.7–2.4)

## 2018-07-18 SURGERY — REPLACEMENT, MITRAL VALVE
Anesthesia: General | Site: Chest

## 2018-07-18 MED ORDER — ONDANSETRON HCL 4 MG/2ML IJ SOLN
4.0000 mg | Freq: Four times a day (QID) | INTRAMUSCULAR | Status: DC | PRN
  Administered 2018-07-20 – 2018-07-21 (×2): 4 mg via INTRAVENOUS
  Filled 2018-07-18 (×2): qty 2

## 2018-07-18 MED ORDER — HEPARIN SODIUM (PORCINE) 1000 UNIT/ML IJ SOLN
INTRAMUSCULAR | Status: AC
  Filled 2018-07-18: qty 2

## 2018-07-18 MED ORDER — PHENYLEPHRINE 40 MCG/ML (10ML) SYRINGE FOR IV PUSH (FOR BLOOD PRESSURE SUPPORT)
PREFILLED_SYRINGE | INTRAVENOUS | Status: AC
  Filled 2018-07-18: qty 10

## 2018-07-18 MED ORDER — MAGNESIUM SULFATE 4 GM/100ML IV SOLN
4.0000 g | Freq: Once | INTRAVENOUS | Status: AC
  Administered 2018-07-18: 4 g via INTRAVENOUS
  Filled 2018-07-18: qty 100

## 2018-07-18 MED ORDER — ORAL CARE MOUTH RINSE
15.0000 mL | OROMUCOSAL | Status: DC
  Administered 2018-07-18 – 2018-07-19 (×5): 15 mL via OROMUCOSAL

## 2018-07-18 MED ORDER — SODIUM CHLORIDE 0.9% FLUSH
3.0000 mL | INTRAVENOUS | Status: DC | PRN
  Administered 2018-07-19 – 2018-07-30 (×4): 3 mL via INTRAVENOUS
  Filled 2018-07-18 (×4): qty 3

## 2018-07-18 MED ORDER — LACTATED RINGERS IV SOLN
INTRAVENOUS | Status: DC
  Administered 2018-07-19: 20 mL/h via INTRAVENOUS

## 2018-07-18 MED ORDER — CHLORHEXIDINE GLUCONATE 0.12 % MT SOLN
15.0000 mL | OROMUCOSAL | Status: AC
  Administered 2018-07-18: 15 mL via OROMUCOSAL

## 2018-07-18 MED ORDER — INSULIN REGULAR BOLUS VIA INFUSION
0.0000 [IU] | Freq: Three times a day (TID) | INTRAVENOUS | Status: DC
  Filled 2018-07-18: qty 10

## 2018-07-18 MED ORDER — ACETAMINOPHEN 650 MG RE SUPP
650.0000 mg | Freq: Once | RECTAL | Status: AC
  Administered 2018-07-18: 650 mg via RECTAL

## 2018-07-18 MED ORDER — POTASSIUM CHLORIDE 10 MEQ/50ML IV SOLN
10.0000 meq | INTRAVENOUS | Status: AC | PRN
  Administered 2018-07-18 – 2018-07-19 (×3): 10 meq via INTRAVENOUS

## 2018-07-18 MED ORDER — SODIUM BICARBONATE 8.4 % IV SOLN
50.0000 meq | Freq: Once | INTRAVENOUS | Status: AC
  Administered 2018-07-18: 50 meq via INTRAVENOUS

## 2018-07-18 MED ORDER — MORPHINE SULFATE (PF) 2 MG/ML IV SOLN
1.0000 mg | INTRAVENOUS | Status: AC | PRN
  Administered 2018-07-18 (×2): 4 mg via INTRAVENOUS
  Filled 2018-07-18 (×2): qty 2

## 2018-07-18 MED ORDER — SUCCINYLCHOLINE CHLORIDE 20 MG/ML IJ SOLN
INTRAMUSCULAR | Status: DC | PRN
  Administered 2018-07-18: 200 mg via INTRAVENOUS

## 2018-07-18 MED ORDER — SODIUM CHLORIDE 0.9 % IR SOLN
Status: DC | PRN
  Administered 2018-07-18: 5000 mL

## 2018-07-18 MED ORDER — CHLORHEXIDINE GLUCONATE 4 % EX LIQD: 30.0000 mL | CUTANEOUS | Status: DC

## 2018-07-18 MED ORDER — MIDAZOLAM HCL 10 MG/2ML IJ SOLN
INTRAMUSCULAR | Status: AC
  Filled 2018-07-18: qty 2

## 2018-07-18 MED ORDER — PANTOPRAZOLE SODIUM 40 MG PO TBEC
40.0000 mg | DELAYED_RELEASE_TABLET | Freq: Every day | ORAL | Status: DC
  Administered 2018-07-20 – 2018-07-31 (×12): 40 mg via ORAL
  Filled 2018-07-18 (×12): qty 1

## 2018-07-18 MED ORDER — MIDAZOLAM HCL 5 MG/5ML IJ SOLN
INTRAMUSCULAR | Status: DC | PRN
  Administered 2018-07-18: 5 mg via INTRAVENOUS
  Administered 2018-07-18: 1 mg via INTRAVENOUS
  Administered 2018-07-18: 3 mg via INTRAVENOUS
  Administered 2018-07-18: 1 mg via INTRAVENOUS

## 2018-07-18 MED ORDER — LACTATED RINGERS IV SOLN
INTRAVENOUS | Status: DC
  Administered 2018-07-18: 23:00:00 via INTRAVENOUS

## 2018-07-18 MED ORDER — SODIUM CHLORIDE 0.9 % IV SOLN
INTRAVENOUS | Status: AC
  Administered 2018-07-18: 16:00:00 via INTRAVENOUS

## 2018-07-18 MED ORDER — ALBUMIN HUMAN 5 % IV SOLN
250.0000 mL | INTRAVENOUS | Status: AC | PRN
  Administered 2018-07-18 – 2018-07-19 (×3): 250 mL via INTRAVENOUS
  Filled 2018-07-18: qty 250

## 2018-07-18 MED ORDER — SODIUM CHLORIDE 0.9 % IV SOLN: 250.0000 mL | INTRAVENOUS | Status: DC

## 2018-07-18 MED ORDER — EPINEPHRINE PF 1 MG/ML IJ SOLN
0.0000 ug/min | INTRAVENOUS | Status: DC
  Administered 2018-07-19: 3 ug/min via INTRAVENOUS
  Filled 2018-07-18: qty 4

## 2018-07-18 MED ORDER — ROCURONIUM BROMIDE 50 MG/5ML IV SOSY
PREFILLED_SYRINGE | INTRAVENOUS | Status: AC
  Filled 2018-07-18: qty 10

## 2018-07-18 MED ORDER — CALCIUM CHLORIDE 10 % IV SOLN
INTRAVENOUS | Status: DC | PRN
  Administered 2018-07-18 (×3): 100 mg via INTRAVENOUS

## 2018-07-18 MED ORDER — NITROGLYCERIN 0.2 MG/ML ON CALL CATH LAB
INTRAVENOUS | Status: DC | PRN
  Administered 2018-07-18: 40 ug via INTRAVENOUS
  Administered 2018-07-18: 80 ug via INTRAVENOUS
  Administered 2018-07-18: 120 ug via INTRAVENOUS

## 2018-07-18 MED ORDER — SODIUM CHLORIDE 0.9% FLUSH
10.0000 mL | Freq: Two times a day (BID) | INTRAVENOUS | Status: DC
  Administered 2018-07-18 – 2018-07-22 (×6): 10 mL

## 2018-07-18 MED ORDER — SODIUM CHLORIDE 0.9 % IV SOLN
INTRAVENOUS | Status: DC
  Administered 2018-07-18: 16:00:00 via INTRAVENOUS

## 2018-07-18 MED ORDER — ACETAMINOPHEN 160 MG/5ML PO SOLN: 650.0000 mg | Freq: Once | ORAL | Status: AC

## 2018-07-18 MED ORDER — SODIUM CHLORIDE 0.9% FLUSH: 10.0000 mL | INTRAVENOUS | Status: DC | PRN

## 2018-07-18 MED ORDER — PROPOFOL 10 MG/ML IV BOLUS
INTRAVENOUS | Status: AC
  Filled 2018-07-18: qty 20

## 2018-07-18 MED ORDER — SODIUM CHLORIDE 0.9 % IV SOLN
INTRAVENOUS | Status: DC | PRN
  Administered 2018-07-18: 750 mg via INTRAVENOUS

## 2018-07-18 MED ORDER — SODIUM CHLORIDE 0.9 % IV SOLN
INTRAVENOUS | Status: DC
  Administered 2018-07-18: 11.5 [IU]/h via INTRAVENOUS
  Administered 2018-07-19: 14.9 [IU]/h via INTRAVENOUS
  Administered 2018-07-19: 20.9 [IU]/h via INTRAVENOUS
  Filled 2018-07-18 (×4): qty 1

## 2018-07-18 MED ORDER — ROCURONIUM BROMIDE 50 MG/5ML IV SOSY
PREFILLED_SYRINGE | INTRAVENOUS | Status: AC
  Filled 2018-07-18: qty 5

## 2018-07-18 MED ORDER — TRAMADOL HCL 50 MG PO TABS
50.0000 mg | ORAL_TABLET | ORAL | Status: DC | PRN
  Administered 2018-07-23 – 2018-07-24 (×5): 100 mg via ORAL
  Filled 2018-07-18 (×5): qty 2

## 2018-07-18 MED ORDER — BISACODYL 5 MG PO TBEC
10.0000 mg | DELAYED_RELEASE_TABLET | Freq: Every day | ORAL | Status: DC
  Administered 2018-07-19 – 2018-07-27 (×9): 10 mg via ORAL
  Filled 2018-07-18 (×11): qty 2

## 2018-07-18 MED ORDER — DEXMEDETOMIDINE HCL IN NACL 400 MCG/100ML IV SOLN
0.0000 ug/kg/h | INTRAVENOUS | Status: DC
  Administered 2018-07-18 – 2018-07-19 (×2): 0.7 ug/kg/h via INTRAVENOUS
  Filled 2018-07-18 (×3): qty 100

## 2018-07-18 MED ORDER — SODIUM CHLORIDE 0.9% FLUSH
3.0000 mL | Freq: Two times a day (BID) | INTRAVENOUS | Status: DC
  Administered 2018-07-20 – 2018-07-22 (×4): 3 mL via INTRAVENOUS

## 2018-07-18 MED ORDER — PHENYLEPHRINE HCL-NACL 20-0.9 MG/250ML-% IV SOLN
0.0000 ug/min | INTRAVENOUS | Status: DC
  Administered 2018-07-18: 50 ug/min via INTRAVENOUS
  Administered 2018-07-19: 60 ug/min via INTRAVENOUS
  Administered 2018-07-19: 35 ug/min via INTRAVENOUS
  Filled 2018-07-18 (×4): qty 250

## 2018-07-18 MED ORDER — MILRINONE LACTATE IN DEXTROSE 20-5 MG/100ML-% IV SOLN
0.2500 ug/kg/min | INTRAVENOUS | Status: DC
  Administered 2018-07-18 (×2): 0.375 ug/kg/min via INTRAVENOUS
  Administered 2018-07-19: 0.25 ug/kg/min via INTRAVENOUS
  Administered 2018-07-19: 0.375 ug/kg/min via INTRAVENOUS
  Administered 2018-07-20 – 2018-07-21 (×4): 0.25 ug/kg/min via INTRAVENOUS
  Filled 2018-07-18 (×8): qty 100

## 2018-07-18 MED ORDER — CHLORHEXIDINE GLUCONATE CLOTH 2 % EX PADS
6.0000 | MEDICATED_PAD | Freq: Every day | CUTANEOUS | Status: DC
  Administered 2018-07-18 – 2018-07-28 (×11): 6 via TOPICAL

## 2018-07-18 MED ORDER — CALCIUM CHLORIDE 10 % IV SOLN
INTRAVENOUS | Status: AC
  Filled 2018-07-18: qty 10

## 2018-07-18 MED ORDER — ACETAMINOPHEN 500 MG PO TABS
1000.0000 mg | ORAL_TABLET | Freq: Four times a day (QID) | ORAL | Status: AC
  Administered 2018-07-19 – 2018-07-23 (×19): 1000 mg via ORAL
  Filled 2018-07-18 (×19): qty 2

## 2018-07-18 MED ORDER — CHLORHEXIDINE GLUCONATE 0.12 % MT SOLN
OROMUCOSAL | Status: AC
  Administered 2018-07-18: 15 mL via OROMUCOSAL
  Filled 2018-07-18: qty 15

## 2018-07-18 MED ORDER — SODIUM CHLORIDE 0.9 % IV SOLN
1.5000 g | Freq: Two times a day (BID) | INTRAVENOUS | Status: AC
  Administered 2018-07-18 – 2018-07-20 (×4): 1.5 g via INTRAVENOUS
  Filled 2018-07-18 (×5): qty 1.5

## 2018-07-18 MED ORDER — OXYCODONE HCL 5 MG PO TABS
5.0000 mg | ORAL_TABLET | ORAL | Status: DC | PRN
  Administered 2018-07-19 – 2018-07-25 (×15): 10 mg via ORAL
  Administered 2018-07-25: 5 mg via ORAL
  Filled 2018-07-18: qty 2
  Filled 2018-07-18: qty 1
  Filled 2018-07-18 (×15): qty 2

## 2018-07-18 MED ORDER — MORPHINE SULFATE (PF) 2 MG/ML IV SOLN
1.0000 mg | INTRAVENOUS | Status: DC | PRN
  Administered 2018-07-19 – 2018-07-21 (×9): 2 mg via INTRAVENOUS
  Filled 2018-07-18 (×9): qty 1

## 2018-07-18 MED ORDER — PROTAMINE SULFATE 10 MG/ML IV SOLN
INTRAVENOUS | Status: AC
  Filled 2018-07-18: qty 25

## 2018-07-18 MED ORDER — HEPARIN SODIUM (PORCINE) 1000 UNIT/ML IJ SOLN
INTRAMUSCULAR | Status: AC
  Filled 2018-07-18: qty 1

## 2018-07-18 MED ORDER — TRANEXAMIC ACID 1000 MG/10ML IV SOLN
1.5000 mg/kg/h | INTRAVENOUS | Status: DC
  Filled 2018-07-18: qty 10

## 2018-07-18 MED ORDER — POTASSIUM CHLORIDE 10 MEQ/50ML IV SOLN: 10.0000 meq | INTRAVENOUS | Status: AC

## 2018-07-18 MED ORDER — PHENYLEPHRINE HCL 10 MG/ML IJ SOLN
INTRAMUSCULAR | Status: DC | PRN
  Administered 2018-07-18 (×2): 120 ug via INTRAVENOUS

## 2018-07-18 MED ORDER — ARTIFICIAL TEARS OPHTHALMIC OINT
TOPICAL_OINTMENT | OPHTHALMIC | Status: AC
  Filled 2018-07-18: qty 3.5

## 2018-07-18 MED ORDER — ALBUMIN HUMAN 5 % IV SOLN
INTRAVENOUS | Status: DC | PRN
  Administered 2018-07-18: 14:00:00 via INTRAVENOUS

## 2018-07-18 MED ORDER — ACETAMINOPHEN 160 MG/5ML PO SOLN
1000.0000 mg | Freq: Four times a day (QID) | ORAL | Status: DC
  Administered 2018-07-19: 1000 mg
  Filled 2018-07-18: qty 40.6

## 2018-07-18 MED ORDER — FENTANYL CITRATE (PF) 250 MCG/5ML IJ SOLN
INTRAMUSCULAR | Status: AC
  Filled 2018-07-18: qty 30

## 2018-07-18 MED ORDER — LACTATED RINGERS IV SOLN
INTRAVENOUS | Status: DC | PRN
  Administered 2018-07-18 (×2): via INTRAVENOUS

## 2018-07-18 MED ORDER — CHLORHEXIDINE GLUCONATE 0.12% ORAL RINSE (MEDLINE KIT)
15.0000 mL | Freq: Two times a day (BID) | OROMUCOSAL | Status: DC
  Administered 2018-07-18 – 2018-07-19 (×2): 15 mL via OROMUCOSAL

## 2018-07-18 MED ORDER — SODIUM BICARBONATE 8.4 % IV SOLN
100.0000 meq | Freq: Once | INTRAVENOUS | Status: AC
  Administered 2018-07-18: 100 meq via INTRAVENOUS
  Filled 2018-07-18: qty 100

## 2018-07-18 MED ORDER — ASPIRIN 81 MG PO CHEW: 324.0000 mg | CHEWABLE_TABLET | Freq: Every day | ORAL | Status: DC

## 2018-07-18 MED ORDER — METOPROLOL TARTRATE 5 MG/5ML IV SOLN: 2.5000 mg | INTRAVENOUS | Status: DC | PRN

## 2018-07-18 MED ORDER — FENTANYL CITRATE (PF) 250 MCG/5ML IJ SOLN
INTRAMUSCULAR | Status: DC | PRN
  Administered 2018-07-18 (×5): 50 ug via INTRAVENOUS
  Administered 2018-07-18: 100 ug via INTRAVENOUS
  Administered 2018-07-18: 50 ug via INTRAVENOUS
  Administered 2018-07-18: 100 ug via INTRAVENOUS
  Administered 2018-07-18: 50 ug via INTRAVENOUS
  Administered 2018-07-18: 100 ug via INTRAVENOUS
  Administered 2018-07-18 (×5): 50 ug via INTRAVENOUS
  Administered 2018-07-18: 100 ug via INTRAVENOUS
  Administered 2018-07-18: 50 ug via INTRAVENOUS
  Administered 2018-07-18: 100 ug via INTRAVENOUS
  Administered 2018-07-18: 50 ug via INTRAVENOUS
  Administered 2018-07-18: 150 ug via INTRAVENOUS
  Administered 2018-07-18: 50 ug via INTRAVENOUS
  Administered 2018-07-18: 100 ug via INTRAVENOUS
  Administered 2018-07-18: 50 ug via INTRAVENOUS
  Administered 2018-07-18: 150 ug via INTRAVENOUS
  Administered 2018-07-18: 50 ug via INTRAVENOUS
  Administered 2018-07-18: 150 ug via INTRAVENOUS

## 2018-07-18 MED ORDER — VANCOMYCIN HCL IN DEXTROSE 1-5 GM/200ML-% IV SOLN
1000.0000 mg | Freq: Once | INTRAVENOUS | Status: AC
  Administered 2018-07-19: 1000 mg via INTRAVENOUS
  Filled 2018-07-18: qty 200

## 2018-07-18 MED ORDER — ARTIFICIAL TEARS OPHTHALMIC OINT
TOPICAL_OINTMENT | OPHTHALMIC | Status: DC | PRN
  Administered 2018-07-18: 1 via OPHTHALMIC

## 2018-07-18 MED ORDER — HEMOSTATIC AGENTS (NO CHARGE) OPTIME
TOPICAL | Status: DC | PRN
  Administered 2018-07-18: 1 via TOPICAL
  Administered 2018-07-18: 3 via TOPICAL

## 2018-07-18 MED ORDER — LACTATED RINGERS IV SOLN: 500.0000 mL | Freq: Once | INTRAVENOUS | Status: DC | PRN

## 2018-07-18 MED ORDER — DOCUSATE SODIUM 100 MG PO CAPS
200.0000 mg | ORAL_CAPSULE | Freq: Every day | ORAL | Status: DC
  Administered 2018-07-19 – 2018-07-31 (×11): 200 mg via ORAL
  Filled 2018-07-18 (×12): qty 2

## 2018-07-18 MED ORDER — ROCURONIUM BROMIDE 10 MG/ML (PF) SYRINGE
PREFILLED_SYRINGE | INTRAVENOUS | Status: DC | PRN
  Administered 2018-07-18 (×5): 50 mg via INTRAVENOUS

## 2018-07-18 MED ORDER — NITROGLYCERIN IN D5W 200-5 MCG/ML-% IV SOLN: 0.0000 ug/min | INTRAVENOUS | Status: DC

## 2018-07-18 MED ORDER — SODIUM CHLORIDE 0.45 % IV SOLN: INTRAVENOUS | Status: DC | PRN

## 2018-07-18 MED ORDER — SODIUM CHLORIDE 0.9 % IV SOLN
INTRAVENOUS | Status: DC | PRN
  Administered 2018-07-18: 14:00:00 via INTRAVENOUS

## 2018-07-18 MED ORDER — DEXMEDETOMIDINE HCL IN NACL 200 MCG/50ML IV SOLN
INTRAVENOUS | Status: AC
  Filled 2018-07-18: qty 50

## 2018-07-18 MED ORDER — FAMOTIDINE IN NACL 20-0.9 MG/50ML-% IV SOLN
20.0000 mg | Freq: Two times a day (BID) | INTRAVENOUS | Status: AC
  Administered 2018-07-18 (×2): 20 mg via INTRAVENOUS
  Filled 2018-07-18: qty 50

## 2018-07-18 MED ORDER — NOREPINEPHRINE 4 MG/250ML-% IV SOLN
0.0000 ug/min | INTRAVENOUS | Status: DC
  Administered 2018-07-18: 4 ug/min via INTRAVENOUS
  Administered 2018-07-18: 12 ug/min via INTRAVENOUS
  Administered 2018-07-19: 8 ug/min via INTRAVENOUS
  Administered 2018-07-20 (×2): 12 ug/min via INTRAVENOUS
  Administered 2018-07-20: 13 ug/min via INTRAVENOUS
  Administered 2018-07-21: 8 ug/min via INTRAVENOUS
  Administered 2018-07-21: 12 ug/min via INTRAVENOUS
  Administered 2018-07-21: 14 ug/min via INTRAVENOUS
  Administered 2018-07-22: 3 ug/min via INTRAVENOUS
  Administered 2018-07-22: 6 ug/min via INTRAVENOUS
  Filled 2018-07-18 (×10): qty 250

## 2018-07-18 MED ORDER — BISACODYL 10 MG RE SUPP: 10.0000 mg | Freq: Every day | RECTAL | Status: DC

## 2018-07-18 MED ORDER — CHLORHEXIDINE GLUCONATE 0.12 % MT SOLN
15.0000 mL | Freq: Once | OROMUCOSAL | Status: AC
  Administered 2018-07-18: 15 mL via OROMUCOSAL

## 2018-07-18 MED ORDER — HEPARIN SODIUM (PORCINE) 1000 UNIT/ML IJ SOLN
INTRAMUSCULAR | Status: DC | PRN
  Administered 2018-07-18: 41000 [IU] via INTRAVENOUS

## 2018-07-18 MED ORDER — ASPIRIN EC 325 MG PO TBEC
325.0000 mg | DELAYED_RELEASE_TABLET | Freq: Every day | ORAL | Status: DC
  Administered 2018-07-19 – 2018-07-20 (×2): 325 mg via ORAL
  Filled 2018-07-18 (×2): qty 1

## 2018-07-18 MED ORDER — FENTANYL CITRATE (PF) 250 MCG/5ML IJ SOLN
INTRAMUSCULAR | Status: AC
  Filled 2018-07-18: qty 10

## 2018-07-18 MED ORDER — PROTAMINE SULFATE 10 MG/ML IV SOLN
INTRAVENOUS | Status: DC | PRN
  Administered 2018-07-18: 490 mg via INTRAVENOUS

## 2018-07-18 MED ORDER — METOPROLOL TARTRATE 12.5 MG HALF TABLET: 12.5000 mg | ORAL_TABLET | Freq: Once | ORAL | Status: DC

## 2018-07-18 MED ORDER — PROPOFOL 10 MG/ML IV BOLUS
INTRAVENOUS | Status: DC | PRN
  Administered 2018-07-18: 100 mg via INTRAVENOUS

## 2018-07-18 MED ORDER — MIDAZOLAM HCL 2 MG/2ML IJ SOLN
2.0000 mg | INTRAMUSCULAR | Status: DC | PRN
  Administered 2018-07-18 – 2018-07-19 (×2): 2 mg via INTRAVENOUS
  Filled 2018-07-18 (×2): qty 2

## 2018-07-18 SURGICAL SUPPLY — 130 items
ADAPTER CARDIO PERF ANTE/RETRO (ADAPTER) ×9 IMPLANT
APPLICATOR COTTON TIP 6 STRL (MISCELLANEOUS) ×2 IMPLANT
APPLICATOR COTTON TIP 6IN STRL (MISCELLANEOUS) ×3 IMPLANT
ARTICLIP LAA PROCLIP II 45 (Clip) ×3 IMPLANT
ATTRACTOMAT 16X20 MAGNETIC DRP (DRAPES) ×6 IMPLANT
BAG DECANTER FOR FLEXI CONT (MISCELLANEOUS) ×6 IMPLANT
BLADE STERNUM SYSTEM 6 (BLADE) ×6 IMPLANT
BLADE SURG 11 STRL SS (BLADE) ×9 IMPLANT
BOOT SUTURE AID YELLOW STND (SUTURE) ×3 IMPLANT
CANISTER SUCT 3000ML PPV (MISCELLANEOUS) ×6 IMPLANT
CANN PRFSN 3/8X14X24FR PCFC (MISCELLANEOUS)
CANN PRFSN 3/8XCNCT ST RT ANG (MISCELLANEOUS)
CANNULA EZ GLIDE 8.0 24FR (CANNULA) ×3 IMPLANT
CANNULA EZ GLIDE AORTIC 21FR (CANNULA) ×3 IMPLANT
CANNULA FEM VENOUS REMOTE 22FR (CANNULA) ×3 IMPLANT
CANNULA FEMORAL ART 14 SM (MISCELLANEOUS) ×3 IMPLANT
CANNULA GUNDRY RCSP 15FR (MISCELLANEOUS) ×6 IMPLANT
CANNULA OPTISITE PERFUSION 20F (CANNULA) ×3 IMPLANT
CANNULA PRFSN 3/8X14X24FR PCFC (MISCELLANEOUS) IMPLANT
CANNULA PRFSN 3/8XCNCT RT ANG (MISCELLANEOUS) IMPLANT
CANNULA SUMP PERICARDIAL (CANNULA) ×3 IMPLANT
CANNULA VEN MTL TIP RT (MISCELLANEOUS)
CARDIOBLATE CARDIAC ABLATION (MISCELLANEOUS)
CATH ROBINSON RED A/P 18FR (CATHETERS) ×3 IMPLANT
CATH THORACIC 28FR RT ANG (CATHETERS) IMPLANT
CATH THORACIC 36FR (CATHETERS) ×3 IMPLANT
CLAMP ISOLATOR SYNERGY LG (MISCELLANEOUS) ×3 IMPLANT
CLAMP OLL ABLATION (MISCELLANEOUS) ×3 IMPLANT
CLIP FOGARTY SPRING 6M (CLIP) IMPLANT
CONN 1/2X1/2X1/2  BEN (MISCELLANEOUS) ×2
CONN 1/2X1/2X1/2 BEN (MISCELLANEOUS) ×4 IMPLANT
CONN 3/8X1/2 ST GISH (MISCELLANEOUS) ×12 IMPLANT
CONN ST 1/4X3/8  BEN (MISCELLANEOUS) ×4
CONN ST 1/4X3/8 BEN (MISCELLANEOUS) ×8 IMPLANT
CONNECTOR 1/2X3/8X1/2 3 WAY (MISCELLANEOUS) ×1
CONNECTOR 1/2X3/8X1/2 3WAY (MISCELLANEOUS) ×2 IMPLANT
CONT SPEC 4OZ CLIKSEAL STRL BL (MISCELLANEOUS) ×3 IMPLANT
COVER PROBE W GEL 5X96 (DRAPES) ×3 IMPLANT
COVER SURGICAL LIGHT HANDLE (MISCELLANEOUS) ×12 IMPLANT
CRADLE DONUT ADULT HEAD (MISCELLANEOUS) ×6 IMPLANT
DERMABOND ADVANCED (GAUZE/BANDAGES/DRESSINGS) ×1
DERMABOND ADVANCED .7 DNX12 (GAUZE/BANDAGES/DRESSINGS) ×2 IMPLANT
DEVICE ATRICLIP LAA PRCLPII 45 (Clip) ×2 IMPLANT
DEVICE CARDIOBLATE CARDIAC ABL (MISCELLANEOUS) IMPLANT
DEVICE SUT CK QUICK LOAD INDV (Prosthesis & Implant Heart) ×3 IMPLANT
DEVICE SUT CK QUICK LOAD MINI (Prosthesis & Implant Heart) ×15 IMPLANT
DRAIN CHANNEL 32F RND 10.7 FF (WOUND CARE) ×12 IMPLANT
DRAPE CARDIOVASCULAR INCISE (DRAPES) ×1
DRAPE INCISE IOBAN 66X45 STRL (DRAPES) ×6 IMPLANT
DRAPE SLUSH/WARMER DISC (DRAPES) ×3 IMPLANT
DRAPE SRG 135X102X78XABS (DRAPES) ×2 IMPLANT
DRSG AQUACEL AG ADV 3.5X14 (GAUZE/BANDAGES/DRESSINGS) ×3 IMPLANT
DRSG COVADERM 4X14 (GAUZE/BANDAGES/DRESSINGS) ×6 IMPLANT
ELECT BLADE 4.0 EZ CLEAN MEGAD (MISCELLANEOUS) ×3
ELECT REM PT RETURN 9FT ADLT (ELECTROSURGICAL) ×12
ELECTRODE BLDE 4.0 EZ CLN MEGD (MISCELLANEOUS) ×2 IMPLANT
ELECTRODE REM PT RTRN 9FT ADLT (ELECTROSURGICAL) ×8 IMPLANT
FELT TEFLON 1X6 (MISCELLANEOUS) ×12 IMPLANT
GAUZE SPONGE 4X4 12PLY STRL (GAUZE/BANDAGES/DRESSINGS) ×3 IMPLANT
GLOVE BIO SURGEON STRL SZ 6 (GLOVE) IMPLANT
GLOVE BIO SURGEON STRL SZ 6.5 (GLOVE) IMPLANT
GLOVE BIO SURGEON STRL SZ7 (GLOVE) IMPLANT
GLOVE BIO SURGEON STRL SZ7.5 (GLOVE) IMPLANT
GLOVE BIOGEL PI IND STRL 7.0 (GLOVE) ×6 IMPLANT
GLOVE BIOGEL PI INDICATOR 7.0 (GLOVE) ×3
GLOVE ORTHO TXT STRL SZ7.5 (GLOVE) ×6 IMPLANT
GOWN STRL REUS W/ TWL LRG LVL3 (GOWN DISPOSABLE) ×16 IMPLANT
GOWN STRL REUS W/TWL LRG LVL3 (GOWN DISPOSABLE) ×8
HEMOSTAT POWDER SURGIFOAM 1G (HEMOSTASIS) ×15 IMPLANT
INSERT FOGARTY XLG (MISCELLANEOUS) ×3 IMPLANT
KIT BASIN OR (CUSTOM PROCEDURE TRAY) ×6 IMPLANT
KIT DILATOR VASC 18G NDL (KITS) ×6 IMPLANT
KIT DRAINAGE VACCUM ASSIST (KITS) ×3 IMPLANT
KIT SUCTION CATH 14FR (SUCTIONS) ×12 IMPLANT
KIT SUT CK MINI COMBO 4X17 (Prosthesis & Implant Heart) ×3 IMPLANT
KIT TURNOVER KIT B (KITS) ×6 IMPLANT
LINE VENT (MISCELLANEOUS) ×6 IMPLANT
LOOP VESSEL SUPERMAXI WHITE (MISCELLANEOUS) ×9 IMPLANT
MARKER GRAFT CORONARY BYPASS (MISCELLANEOUS) IMPLANT
NS IRRIG 1000ML POUR BTL (IV SOLUTION) ×27 IMPLANT
PACK OPEN HEART (CUSTOM PROCEDURE TRAY) ×6 IMPLANT
PAD ARMBOARD 7.5X6 YLW CONV (MISCELLANEOUS) ×12 IMPLANT
POWDER SURGICEL 3.0 GRAM (HEMOSTASIS) ×12 IMPLANT
PROBE CRYO2-ABLATION MALLABLE (MISCELLANEOUS) ×3 IMPLANT
RING TRICUSPID T30 (Prosthesis & Implant Heart) ×3 IMPLANT
SET CARDIOPLEGIA MPS 5001102 (MISCELLANEOUS) ×3 IMPLANT
SET IRRIG TUBING LAPAROSCOPIC (IRRIGATION / IRRIGATOR) ×6 IMPLANT
SOLUTION ANTI FOG 6CC (MISCELLANEOUS) ×3 IMPLANT
SPONGE LAP 18X18 X RAY DECT (DISPOSABLE) ×3 IMPLANT
SPONGE LAP 4X18 RFD (DISPOSABLE) ×3 IMPLANT
SUCKER INTRACARDIAC WEIGHTED (SUCKER) ×6 IMPLANT
SURGIFLO W/THROMBIN 8M KIT (HEMOSTASIS) ×3 IMPLANT
SUT BONE WAX W31G (SUTURE) ×3 IMPLANT
SUT ETHIBON 2 0 V 52N 30 (SUTURE) ×3 IMPLANT
SUT ETHIBOND (SUTURE) ×6 IMPLANT
SUT ETHIBOND 2 0 SH (SUTURE) ×13 IMPLANT
SUT ETHIBOND 2 0 SH 36X2 (SUTURE) ×8 IMPLANT
SUT ETHIBOND 2 0 V4 (SUTURE) IMPLANT
SUT ETHIBOND 2 0V4 GREEN (SUTURE) IMPLANT
SUT ETHIBOND 2-0 RB-1 WHT (SUTURE) ×6 IMPLANT
SUT ETHIBOND 4 0 TF (SUTURE) IMPLANT
SUT ETHIBOND 5 0 C 1 30 (SUTURE) ×3 IMPLANT
SUT ETHIBOND X763 2 0 SH 1 (SUTURE) ×12 IMPLANT
SUT MNCRL AB 3-0 PS2 18 (SUTURE) ×6 IMPLANT
SUT PDS AB 1 CTX 36 (SUTURE) ×6 IMPLANT
SUT PROLENE 3 0 SH 1 (SUTURE) ×6 IMPLANT
SUT PROLENE 3 0 SH DA (SUTURE) ×9 IMPLANT
SUT PROLENE 3 0 SH1 36 (SUTURE) ×12 IMPLANT
SUT PROLENE 4 0 RB 1 (SUTURE) ×5
SUT PROLENE 4 0 SH DA (SUTURE) ×15 IMPLANT
SUT PROLENE 4-0 RB1 .5 CRCL 36 (SUTURE) ×10 IMPLANT
SUT SILK  1 MH (SUTURE) ×8
SUT SILK 1 MH (SUTURE) ×16 IMPLANT
SUT STEEL 6MS V (SUTURE) IMPLANT
SUT STEEL STERNAL CCS#1 18IN (SUTURE) IMPLANT
SUT STEEL SZ 6 DBL 3X14 BALL (SUTURE) IMPLANT
SUT VIC AB 1 CTX 18 (SUTURE) ×3 IMPLANT
SUT VIC AB 2-0 CTX 27 (SUTURE) IMPLANT
SYSTEM SAHARA CHEST DRAIN ATS (WOUND CARE) ×6 IMPLANT
TAPE CLOTH SURG 4X10 WHT LF (GAUZE/BANDAGES/DRESSINGS) ×3 IMPLANT
TAPE UMBILICAL COTTON 1/8X30 (MISCELLANEOUS) ×3 IMPLANT
TOWEL GREEN STERILE (TOWEL DISPOSABLE) ×6 IMPLANT
TOWEL GREEN STERILE FF (TOWEL DISPOSABLE) ×6 IMPLANT
TRAY FOLEY SLVR 14FR TEMP STAT (SET/KITS/TRAYS/PACK) ×3 IMPLANT
TRAY FOLEY SLVR 16FR TEMP STAT (SET/KITS/TRAYS/PACK) ×3 IMPLANT
TUBING INSUFFLATION 10FT LAP (TUBING) ×3 IMPLANT
UNDERPAD 30X30 (UNDERPADS AND DIAPERS) ×6 IMPLANT
VALVE MITRAL 33MM (Prosthesis & Implant Heart) ×3 IMPLANT
WATER STERILE IRR 1000ML POUR (IV SOLUTION) ×12 IMPLANT
WIRE EMERALD 3MM-J .035X150CM (WIRE) ×3 IMPLANT

## 2018-07-18 NOTE — Anesthesia Preprocedure Evaluation (Signed)
Anesthesia Evaluation  Patient identified by MRN, date of birth, ID band Patient awake    Reviewed: Allergy & Precautions, NPO status , Patient's Chart, lab work & pertinent test results  Airway Mallampati: III  TM Distance: >3 FB Neck ROM: Full    Dental  (+) Partial Lower, Partial Upper, Dental Advisory Given   Pulmonary former smoker,    breath sounds clear to auscultation       Cardiovascular hypertension,  Rhythm:Irregular Rate:Normal     Neuro/Psych    GI/Hepatic   Endo/Other    Renal/GU      Musculoskeletal   Abdominal (+) + obese,   Peds  Hematology   Anesthesia Other Findings   Reproductive/Obstetrics                             Anesthesia Physical Anesthesia Plan  ASA: III  Anesthesia Plan: General   Post-op Pain Management:    Induction: Intravenous  PONV Risk Score and Plan: Ondansetron  Airway Management Planned: Oral ETT  Additional Equipment: Arterial line, CVP, PA Cath, 3D TEE and Ultrasound Guidance Line Placement  Intra-op Plan:   Post-operative Plan: Post-operative intubation/ventilation  Informed Consent: I have reviewed the patients History and Physical, chart, labs and discussed the procedure including the risks, benefits and alternatives for the proposed anesthesia with the patient or authorized representative who has indicated his/her understanding and acceptance.   Dental advisory given  Plan Discussed with: CRNA and Anesthesiologist  Anesthesia Plan Comments:         Anesthesia Quick Evaluation

## 2018-07-18 NOTE — Interval H&P Note (Signed)
History and Physical Interval Note:  07/18/2018 5:52 AM  Jeffrey Larson  has presented today for surgery, with the diagnosis of Mitral Stenosis/ Regurgitation Atrial Fibrillation Tricuspid Regurgitation  The various methods of treatment have been discussed with the patient and family. After consideration of risks, benefits and other options for treatment, the patient has consented to  Procedure(s) with comments: MITRAL VALVE (MV) REPLACEMENT (N/A) - Glutataldehyde MAZE (N/A) TRICUSPID VALVE REPAIR (N/A) TRANSESOPHAGEAL ECHOCARDIOGRAM (TEE) (N/A) as a surgical intervention .  The patient's history has been reviewed, patient examined, no change in status, stable for surgery.  I have reviewed the patient's chart and labs.  Questions were answered to the patient's satisfaction.     Rexene Alberts

## 2018-07-18 NOTE — Anesthesia Procedure Notes (Signed)
Arterial Line Insertion Start/End8/23/2019 6:45 AM, 07/18/2018 6:55 AM Performed by: Lance Coon, CRNA, CRNA  Patient location: Pre-op. Preanesthetic checklist: patient identified, IV checked, site marked, risks and benefits discussed, surgical consent, monitors and equipment checked, pre-op evaluation, timeout performed and anesthesia consent Lidocaine 1% used for infiltration Left, radial was placed Catheter size: 20 G Hand hygiene performed , maximum sterile barriers used  and Seldinger technique used  Attempts: 1 Procedure performed without using ultrasound guided technique. Following insertion, dressing applied and Biopatch. Post procedure assessment: normal  Patient tolerated the procedure well with no immediate complications.

## 2018-07-18 NOTE — Progress Notes (Signed)
  Echocardiogram Echocardiogram Transesophageal has been performed.  Jeffrey Larson 07/18/2018, 9:13 AM

## 2018-07-18 NOTE — Progress Notes (Addendum)
Pt had a leak around the tube.  ETT pushed in 3cm.  ETT now at 25 at the lips.  Equal bilateral BS heard. Pt is comfortable and getting good volumes.

## 2018-07-18 NOTE — Progress Notes (Signed)
Patient interviewed in the preop area. Patient able to confirm name, DOB, procedure, allergies,npo status and no metal in body. Patient denies any pain at this time.  Lattie Corns, RN

## 2018-07-18 NOTE — Anesthesia Procedure Notes (Signed)
Central Venous Catheter Insertion Performed by: anesthesiologist Start/End8/23/2019 6:50 AM, 07/18/2018 7:00 AM Patient location: Pre-op. Preanesthetic checklist: patient identified, IV checked, site marked, risks and benefits discussed, surgical consent, monitors and equipment checked, pre-op evaluation, timeout performed and anesthesia consent Position: supine Hand hygiene performed  and maximum sterile barriers used  PA cath was placed.Swan type:thermodilution Procedure performed without using ultrasound guided technique. Attempts: 1 Following insertion, line sutured, dressing applied and Biopatch. Patient tolerated the procedure well with no immediate complications.

## 2018-07-18 NOTE — Transfer of Care (Signed)
Immediate Anesthesia Transfer of Care Note  Patient: Jeffrey Larson  Procedure(s) Performed: MITRAL VALVE (MV) REPLACEMENT WITH CARBOMEDICS OPTIFORM MITRAL VALVE SIZE 33. (N/A Chest) MAZE (N/A ) TRICUSPID VALVE REPAIR WITH EDWARDS MC3 TRICUSPID ANNULOPLASTY RING SIZE 30. (N/A Chest) TRANSESOPHAGEAL ECHOCARDIOGRAM (TEE) (N/A )  Patient Location: ICU  Anesthesia Type:General  Level of Consciousness: sedated, comatose and Patient remains intubated per anesthesia plan  Airway & Oxygen Therapy: Patient remains intubated per anesthesia plan and Patient placed on Ventilator (see vital sign flow sheet for setting)  Post-op Assessment: Report given to RN and Post -op Vital signs reviewed and stable  Post vital signs: Reviewed and stable  Last Vitals:  Vitals Value Taken Time  BP 99/65 07/18/2018  3:37 PM  Temp 37.5 C 07/18/2018  3:45 PM  Pulse 80 07/18/2018  3:45 PM  Resp 24 07/18/2018  3:45 PM  SpO2 98 % 07/18/2018  3:45 PM  Vitals shown include unvalidated device data.  Last Pain:  Vitals:   07/18/18 0602  TempSrc:   PainSc: 0-No pain      Patients Stated Pain Goal: 3 (06/00/45 9977)  Complications: No apparent anesthesia complications

## 2018-07-18 NOTE — Progress Notes (Signed)
Per ABG results and Dr Roxy Manns at bedside, VT increased to 10 ml/kg 767ml, and rate increased to 26.  RN at bedside and aware.

## 2018-07-18 NOTE — Progress Notes (Signed)
Paged Roxan Hockey MD about ABG. One amp of bicarb ordered. Will repeat ABG in an hour.

## 2018-07-18 NOTE — Progress Notes (Addendum)
Per CRNA and Dr Dian Situ- pt required higher RR in OR and requested pt set vent RR be placed on 22-24.  RN aware to notify Dr Roxy Manns when he comes to bedside to assess pt post op.   58- Dr Ricard Dillon aware

## 2018-07-18 NOTE — Anesthesia Procedure Notes (Signed)
Central Venous Catheter Insertion Performed by: Roberts Gaudy, MD, anesthesiologist Start/End8/23/2019 6:50 AM, 07/18/2018 7:00 AM Patient location: Pre-op. Preanesthetic checklist: patient identified, IV checked, site marked, risks and benefits discussed, surgical consent, monitors and equipment checked, pre-op evaluation, timeout performed and anesthesia consent Lidocaine 1% used for infiltration and patient sedated Hand hygiene performed  and maximum sterile barriers used  Catheter size: 8.5 Fr Sheath introducer Procedure performed using ultrasound guided technique. Ultrasound Notes:anatomy identified, needle tip was noted to be adjacent to the nerve/plexus identified, no ultrasound evidence of intravascular and/or intraneural injection and image(s) printed for medical record Attempts: 1 Following insertion, line sutured and dressing applied. Post procedure assessment: blood return through all ports, free fluid flow and no air  Patient tolerated the procedure well with no immediate complications.

## 2018-07-18 NOTE — Brief Op Note (Signed)
07/18/2018  3:04 PM  PATIENT:  Jeffrey Larson  62 y.o. male  PRE-OPERATIVE DIAGNOSIS:  1. Severe mitral Stenosis with moderate mitral regurgitation 2. Atrial Fibrillation 3. Moderate tricuspid Regurgitation  POST-OPERATIVE DIAGNOSIS:  1. Severe mitral Stenosis with moderate mitral regurgitation 2. Atrial Fibrillation 3. Moderate tricuspid Regurgitation  PROCEDURE:  TRANSESOPHAGEAL ECHOCARDIOGRAM (TEE), MEDIAN STERNOTOMY for MITRAL VALVE (MV) REPLACEMENT (using a CARBOMEDICS OPTIFORM MECHANICAL MITRAL VALVE, Model# [id number], Serial # [id number], SIZE 33), TRICUSPID VALVE REPAIR (using an Annuloplasty ring, Model# 9753, Serial # [id number], size 30 ), BILATERAL COX CRYO and BIPOLAR MAZE, and LA CL  SURGEON:  Surgeon(s) and Role:    Rexene Alberts, MD - Primary  PHYSICIAN ASSISTANT: Lars Pinks PA-C  ANESTHESIA:   general  EBL:  950 mL   DRAINS: Blake drains and chest tubes placed in the mediastinal and pleural spaces   SPECIMEN:  Source of Specimen:  Mitral valve  DISPOSITION OF SPECIMEN:  PATHOLOGY  COUNTS CORRECT:  YES  DICTATION: .Dragon Dictation  PLAN OF CARE: Admit to inpatient   PATIENT DISPOSITION:  ICU - intubated and hemodynamically stable.   Delay start of Pharmacological VTE agent (>24hrs) due to surgical blood loss or risk of bleeding: yes  BASELINE WEIGHT: 136.2 kg

## 2018-07-18 NOTE — Progress Notes (Signed)
      [address]       [place] [address]             [phone]      Intubated, starting to wake up  BP 101/62   Pulse 89   Temp 99.9 F (37.7 C)   Resp 12   SpO2 99%    Ci= 2.6 on milrinone, epi, norepi  Intake/Output Summary (Last 24 hours) at 07/18/2018 1841 Last data filed at 07/18/2018 1800 Gross per 24 hour  Intake 4485.47 ml  Output 2770 ml  Net 1715.47 ml   7.25/42/124/18- give bicarb  Hct= 46  Continue current care  Hollan Philipp C. Roxan Hockey, MD [facility] [phone]

## 2018-07-18 NOTE — Anesthesia Procedure Notes (Signed)
Procedure Name: Intubation Date/Time: 07/18/2018 8:07 AM Performed by: Lance Coon, CRNA Pre-anesthesia Checklist: Patient identified, Emergency Drugs available, Suction available, Patient being monitored and Timeout performed Patient Re-evaluated:Patient Re-evaluated prior to induction Oxygen Delivery Method: Circle system utilized Preoxygenation: Pre-oxygenation with 100% oxygen Induction Type: IV induction Ventilation: Mask ventilation without difficulty Laryngoscope Size: Miller and 3 Grade View: Grade II Tube type: Oral Tube size: 8.0 mm Number of attempts: 1 Airway Equipment and Method: Stylet Placement Confirmation: ETT inserted through vocal cords under direct vision,  positive ETCO2 and breath sounds checked- equal and bilateral Secured at: 22 cm Tube secured with: Tape Dental Injury: Teeth and Oropharynx as per pre-operative assessment

## 2018-07-18 NOTE — Anesthesia Procedure Notes (Signed)
Central Venous Catheter Insertion Performed by: Roberts Gaudy, MD, anesthesiologist Start/End8/23/2019 6:50 AM, 07/18/2018 7:00 AM Patient location: Pre-op. Preanesthetic checklist: patient identified, IV checked, site marked, risks and benefits discussed, surgical consent, monitors and equipment checked, pre-op evaluation, timeout performed and anesthesia consent Position: Trendelenburg Lidocaine 1% used for infiltration and patient sedated Hand hygiene performed , maximum sterile barriers used  and Seldinger technique used Catheter size: 8 Fr Total catheter length 16. Central line was placed.Double lumen Procedure performed using ultrasound guided technique. Ultrasound Notes:anatomy identified, needle tip was noted to be adjacent to the nerve/plexus identified, no ultrasound evidence of intravascular and/or intraneural injection and image(s) printed for medical record Attempts: 1 Following insertion, dressing applied, line sutured and Biopatch. Post procedure assessment: blood return through all ports  Patient tolerated the procedure well with no immediate complications.

## 2018-07-18 NOTE — Anesthesia Postprocedure Evaluation (Signed)
Anesthesia Post Note  Patient: Jeffrey Larson  Procedure(s) Performed: MITRAL VALVE (MV) REPLACEMENT WITH CARBOMEDICS OPTIFORM MITRAL VALVE SIZE 33. (N/A Chest) MAZE (N/A ) TRICUSPID VALVE REPAIR WITH EDWARDS MC3 TRICUSPID ANNULOPLASTY RING SIZE 30. (N/A Chest) TRANSESOPHAGEAL ECHOCARDIOGRAM (TEE) (N/A )     Patient location during evaluation: SICU Anesthesia Type: General Level of consciousness: sedated and patient remains intubated per anesthesia plan Pain management: pain level controlled Vital Signs Assessment: post-procedure vital signs reviewed and stable Respiratory status: patient remains intubated per anesthesia plan and patient on ventilator - see flowsheet for VS Cardiovascular status: stable Postop Assessment: no apparent nausea or vomiting Anesthetic complications: no    Last Vitals:  Vitals:   07/18/18 1700 07/18/18 1800  BP:  101/62  Pulse: 80 89  Resp: 17 12  Temp: 37.7 C 37.7 C  SpO2: 100% 99%    Last Pain:  Vitals:   07/18/18 1600  TempSrc: Core (Comment)  PainSc:                  Emiliya Chretien COKER

## 2018-07-18 NOTE — Progress Notes (Signed)
Paged Roxan Hockey MD about repeat ABG and notified about ET tube advancements. No new orders at this time. Will continue to monitor.

## 2018-07-18 NOTE — Op Note (Signed)
CARDIOTHORACIC SURGERY OPERATIVE NOTE  Date of Procedure:  07/18/2018  Preoperative Diagnosis:   Severe Mitral Stenosis  Moderate Tricuspid Regurgitation  Longstanding Persistent Atrial Fibrillation  Postoperative Diagnosis: Same  Procedure:   Mitral Valve Replacement   Sorin Carbomedics Optiform bileaflet mechanical valve (size 33 mm, catalog #[id number], serial #[id number])   Tricuspid Valve Repair  Edwards mc3 ring annuloplasty (size 30 mm, model #4900, serial #[id number])   Maze Procedure   complete bilateral atrial lesion set using bipolar radiofrequency and cryothermy ablation  clipping of left atrial appendage (Atricure Pro245 left atrial clip, size 45 mm)    Surgeon: Valentina Gu. 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  Type I tricuspid valve disease with moderate tricuspid regurgitation  Low normal left ventricular systolic function  Severe pulmonary hypertension  Moderate right ventricular chamber enlargement and systolic dysfunction                     BRIEF CLINICAL NOTE AND INDICATIONS FOR SURGERY  Patient is a 62 year old morbidly obese male with history of coronary artery disease status post acute myocardial infarction in 2013 treated with multivessel PCI and stenting, chronic combined systolic and diastolic congestive heart failure, long-standing persistent atrial fibrillation, and COPD with chronic bronchitis who has been referred for surgical consultation to discuss treatment options for management of likely rheumatic heart disease with severe symptomatic mitral stenosis and mitral regurgitation.  The patient states that he first began to experience symptoms of exertional shortness of breath 8 or 9 years ago. In 2013 he suffered an acute myocardial infarction and was treated with multivessel PCI and stenting. He was  noted to be in atrial fibrillation at that time and started on warfarin anticoagulation. He has been followed intermittently ever since by Dr. [name]. He states that symptoms of exertional shortness of breath and lower extremity edema have gradually progressed. Echocardiograms have documented the presence of preserved left ventricular systolic function with likely rheumatic mitral valve disease and severe mitral stenosis. Over the past 2 to 3 years patient symptoms have limited his physical activity considerably, and recently he finds that he cannotdo much of anything without getting short of breath. He denies resting shortness of breath but he gets short of breath with very low level activity, such as just walking to his garage. He denies history of PND, palpitations, dizzy spells, or syncope. He has chronic lower extremity edema and takes Lasix on a daily basis. He denies any history of exertional chest pain or chest tightness although he reports some occasional episodes of tightness across his chest that seem to come and go without specific activities. He was seen in follow-up recently by Dr. [name] and scheduled for elective TEE and catheterization. Catheterization revealed moderate nonobstructive coronary artery disease with continued patency of stents placed previously in the right coronary artery in the left anterior descending coronary artery. There was severe pulmonary hypertension. Mean transvalvular gradient across the mitral valve was estimated 17.5 mmHg corresponding to mitral valve area calculated 0.84 cm. TEE confirmed the presence of likely rheumatic mitral valve disease with severe mitral stenosis and moderate mitral regurgitation. There was moderate left ventricular systolic dysfunction with ejection fraction estimated only 35 to 40%. There appeared to be thrombus in the left atrial appendage. Right ventricular function was mildly reduced. There was moderate tricuspid  regurgitation. Cardiothoracic surgical consultation was requested.  The patient has been seen  in consultation and counseled at length regarding the indications, risks and potential benefits of surgery.  All questions have been answered, and the patient provides full informed consent for the operation as described.    DETAILS OF THE OPERATIVE PROCEDURE  Preparation:  The patient is brought to the operating room on the above mentioned date and central monitoring was established by the anesthesia team including placement of Swan-Ganz catheter and radial arterial line.  The patient was noted to have severe pulmonary hypertension at baseline with PA pressures approaching systolic.  The patient is placed in the supine position on the operating table.  Intravenous antibiotics are administered. General endotracheal anesthesia is induced uneventfully. A Foley catheter is placed.  Baseline transesophageal echocardiogram was performed.  Findings were notable for severe rheumatic mitral stenosis.  There was low normal left ventricular systolic function with left ventricular dysfunction primarily related to dyssynchrony because of septal flattening caused by right ventricular dysfunction.  There was moderate right ventricular chamber enlargement and significant dysfunction.  The tricuspid annulus was dilated 4.6 cm.  There was moderate (3+) tricuspid regurgitation.  The aortic valve is normal.  The patient's chest, abdomen, both groins, and both lower extremities are prepared and draped in a sterile manner. A time out procedure is performed.   Surgical Approach:  A median sternotomy incision was performed and the pericardium is opened. The ascending aorta is short but otherwise in appearance.  There are adhesions obliterating the pericardial space along the anterior surface of the right ventricle.   Extracorporeal Cardiopulmonary Bypass and Myocardial Protection:  The right internal jugular vein is  cannulated using the Seldinger technique with ultrasound guidance and a guidewire advanced into the right atrium.  The right common femoral vein is cannulated using the Seldinger technique and a guidewire advanced into the right atrium using TEE guidance.  The patient is heparinized systemically and the femoral vein cannulated using a 22 Fr long femoral venous cannula.  The right internal jugular vein is cannulated with a 14 [place] pediatric femoral venous cannula.  The ascending aorta is cannulated for cardiopulmonary bypass.  Adequate heparinization is verified.   A retrograde cardioplegia cannula is placed through the right atrium into the coronary sinus.   The entire pre-bypass portion of the operation was notable for stable hemodynamics.  Cardiopulmonary bypass was begun and the surface of the heart is inspected.   Sharp dissection and electrocautery are utilized to take down all adhesions surrounding the epicardial surface of the heart.  A cardioplegia cannula is placed in the ascending aorta.  Umbilical tapes were placed around the superior vena cava and the inferior vena cava.  A temperature probe was placed in the interventricular septum.  The patient is cooled to 28C systemic temperature.  The aortic cross clamp is applied and cardioplegia is delivered initially in an antegrade fashion through the aortic root using modified [place] cold blood cardioplegia (Kennestone blood cardioplegia protocol).   The initial cardioplegic arrest is rapid with early diastolic arrest.  Repeat doses of cardioplegia are administered at 90 minutes and every 30 minutes thereafter through the coronary sinus catheter in order to maintain completely flat electrocardiogram.  Myocardial protection was felt to be excellent.   Maze Procedure (left atrial lesion set):  The AtriCure Synergy bipolar radiofrequency ablation clamp is used for all radiofrequency ablation lesions for the maze procedure.  The Atricure CryoICE  nitrous oxide cryothermy system is utilized for all cryothermy ablation lesions.   The heart is retracted towards the  surgeon's side and the left sided pulmonary veins exposed.  An elliptical ablation lesion is created around the base of the left sided pulmonary veins.  A similar elliptical lesion was created around the base of the left atrial appendage.  The left atrial appendage was obliterated using an Atricure left atrial appendage clip (Atricure Pro245 left atrial clip, size 69mm).  The heart was replaced into the pericardial sac.  An elliptical ablation lesion is created around the base of the right sided pulmonary veins.    A left atriotomy incision was performed through the interatrial groove and extended partially across the back wall of the left atrium after opening the oblique sinus inferiorly.  The floor of the left atrium and the mitral valve were exposed using a self-retaining retractor.  The mitral valve was inspected.  There was classical rheumatic mitral valve disease with critical mitral stenosis.  The entire valve was heavily calcified.  A bipolar ablation lesion was placed across the dome of the left atrium from the cephalad apex of the atriotomy incision to reach the cephalad apex of the elliptical lesion around the left sided pulmonary veins.  A similar bipolar lesion was placed across the back wall of the left atrium from the caudad apex of the atriotomy incision to reach the caudad apex of the elliptical lesion around the left sided pulmonary veins, thereby completing a box.  Finally another bipolar lesion was placed across the back wall of the left atrium from the caudad apex of the atriotomy incision towards the posterior mitral valve annulus.  This lesion was completed along the endocardial surface onto the posterior mitral annulus with a 3 minute duration cryothermy lesion, followed by a second cryothermy lesion along the posterior epicardial surface of the left atrium across the  coronary sinus.  This completes the entire left side lesion set of the Cox maze procedure.   Mitral Valve Replacement:  The anterior leaflet of the mitral valve was excised sharply.  Excising the valve was somewhat technically challenging because of extensive calcification throughout.  The entire subvalvular apparatus was involved with foreshortening and calcification.  None of the subvalvular apparatus could be preserved..   The mitral annulus was sized to accept a 33 mm prosthesis.  The left ventricle was irrigated with copious cold saline solution.  Mitral valve replacement was performed using interrupted horizontal mattress 2-0 Ethibond pledgeted sutures with pledgets in the supraannular position.   A Sorin Carbomedics Optiform bileaflet mechanical valve (size 33 mm, catalog # [id number], serial # [id number]) was implanted uneventfully.  All sutures were secured using a Cor-knot device.  After the valve was implanted valve function was carefully inspected to make sure the both leaflets open and closed without impingement.  There is no sign of any communication around the valve to suggest paravalvular leak.  The atriotomy was closed using a 2-layer closure of running 3-0 Prolene suture after placing a sump drain across the mitral valve to serve as a left ventricular vent.     Maze Procedure (right atrial lesion set):  The inferior vena cava cannula was pulled down until the tip was just below the junction between the right atrium and the inferior vena cava. An oblique incision is made in the right atrium. Traction sutures are placed to facilitate exposure of the tricuspid valve. The tricuspid valve is inspected carefully. The tricuspid valve leaflets appear normal with normal mobility and no sign of any fibrosis or thickening.   The AtriCure Synergy bipolar radiofrequency ablation clamp is  utilized to create a series of linear lesions in the right atrium, each with one limb of the clamp along the  endocardial surface and the other along the epicardial surface. The first lesion is placed from the posterior apex of the atriotomy incision and along the lateral wall of the right atrium to reach the lateral aspect of the superior vena cava. A second lesion is placed in the opposite direction from the posterior apex of the atriotomy incision along the lateral wall to reach the lateral aspect of the inferior vena cava. A third lesion is placed from the midportion of the atriotomy incision extending at a right angle to reach the tip of the right atrial appendage. A fourth lesion is placed from the anterior apex of the atriotomy incision in an anterior and inferior direction to reach the acute margin of the heart. Finally, the cryotherapy probe is utilized to complete the right atrial lesion set by placing the probe along the endocardial surface of the right atrium from the anterior apex of the atriotomy incision to reach the tricuspid annulus at the 2:00 position.   Tricuspid Valve Repair:  Tricuspid ring annuloplasty is performed using interrupted 2-0 Ethibond horizontal mattress sutures placed circumferentially around the tricuspid annulus with exception of the area immediately below the triangle of Koch.  The tricuspid valve was sized to accept a 30 mm annuloplasty ring based upon the overall surface area of the combined anterior and posterior leaflets. An [facility] Cam 3 annuloplasty ring (size 30 mm, model #4900, serial #[id number]) is implanted uneventfully. After completion of the annuloplasty the valve was tested with saline and appears to be competent.   One final dose of warm retrograde "reanimation dose" blood cardioplegia was given and the aortic cross clamp removed after a total cross clamp duration of 156 minutes.  The right atriotomy incision is closed using a 2 layer closure of running 4-0 Prolene suture.   Procedure Completion:  Epicardial pacing wires are fixed to the right ventricular outflow  tract and to the right atrial appendage. The patient is rewarmed to 37C temperature. The aortic and left ventricular vents are removed.  The patient is weaned and disconnected from cardiopulmonary bypass.  The patient's rhythm at separation from bypass was AV paced.  The patient was weaned from cardiopulmonary bypass on low dose epinephrine and milrinone infusions. Total cardiopulmonary bypass time for the operation was 204 minutes.  Followup transesophageal echocardiogram performed after separation from bypass revealed a well-seated mitral valve prosthesis that was functioning normally and without any sign of perivalvular leak.  Left and right ventricular function was unchanged from preoperatively.  There was a well-seated annuloplasty ring the tricuspid position with no residual tricuspid regurgitation.  The aortic cannula was removed uneventfully. Protamine was administered to reverse the anticoagulation. The femoral and right internal jugular venous cannulae were removed and manual pressure held on the groin and neck for 30 minutes.  The mediastinum and pleural space were inspected for hemostasis and irrigated with saline solution. The mediastinum and both pleural spaces were drained using 4 chest tubes placed through separate stab incisions inferiorly.  The soft tissues anterior to the aorta were reapproximated loosely. The sternum is closed with double strength sternal wire. The soft tissues anterior to the sternum were closed in multiple layers and the skin is closed with a running subcuticular skin closure.   The post-bypass portion of the operation was notable for stable rhythm and hemodynamics.   No blood products were administered during the operation.  Patient Disposition:  The patient tolerated the procedure well and is transported to the surgical intensive care in stable condition. There are no intraoperative complications. All sponge instrument and needle counts are verified correct at  completion of the operation.     Valentina Gu. Roxy Manns MD 07/18/2018 3:07 PM

## 2018-07-19 ENCOUNTER — Inpatient Hospital Stay (HOSPITAL_COMMUNITY): Payer: BLUE CROSS/BLUE SHIELD

## 2018-07-19 LAB — POCT I-STAT 3, ART BLOOD GAS (G3+)
ACID-BASE DEFICIT: 2 mmol/L (ref 0.0–2.0)
ACID-BASE DEFICIT: 4 mmol/L — AB (ref 0.0–2.0)
Bicarbonate: 22.5 mmol/L (ref 20.0–28.0)
Bicarbonate: 20.9 mmol/L (ref 20.0–28.0)
O2 SAT: 98 %
O2 SAT: 97 %
PCO2 ART: 36.3 mmHg (ref 32.0–48.0)
PO2 ART: 119 mmHg — AB (ref 83.0–108.0)
TCO2: 24 mmol/L (ref 22–32)
TCO2: 22 mmol/L (ref 22–32)
pCO2 arterial: 37.7 mmHg (ref 32.0–48.0)
pH, Arterial: 7.388 (ref 7.350–7.450)
pH, Arterial: 7.369 (ref 7.350–7.450)
pO2, Arterial: 91 mmHg (ref 83.0–108.0)

## 2018-07-19 LAB — GLUCOSE, CAPILLARY
GLUCOSE-CAPILLARY: 108 mg/dL — AB (ref 70–99)
GLUCOSE-CAPILLARY: 119 mg/dL — AB (ref 70–99)
GLUCOSE-CAPILLARY: 138 mg/dL — AB (ref 70–99)
GLUCOSE-CAPILLARY: 148 mg/dL — AB (ref 70–99)
GLUCOSE-CAPILLARY: 156 mg/dL — AB (ref 70–99)
GLUCOSE-CAPILLARY: 173 mg/dL — AB (ref 70–99)
GLUCOSE-CAPILLARY: 196 mg/dL — AB (ref 70–99)
GLUCOSE-CAPILLARY: 211 mg/dL — AB (ref 70–99)
GLUCOSE-CAPILLARY: 95 mg/dL (ref 70–99)
Glucose-Capillary: 183 mg/dL — ABNORMAL HIGH (ref 70–99)
Glucose-Capillary: 204 mg/dL — ABNORMAL HIGH (ref 70–99)
Glucose-Capillary: 199 mg/dL — ABNORMAL HIGH (ref 70–99)
Glucose-Capillary: 127 mg/dL — ABNORMAL HIGH (ref 70–99)
Glucose-Capillary: 131 mg/dL — ABNORMAL HIGH (ref 70–99)
Glucose-Capillary: 143 mg/dL — ABNORMAL HIGH (ref 70–99)
Glucose-Capillary: 147 mg/dL — ABNORMAL HIGH (ref 70–99)
Glucose-Capillary: 129 mg/dL — ABNORMAL HIGH (ref 70–99)
Glucose-Capillary: 131 mg/dL — ABNORMAL HIGH (ref 70–99)
Glucose-Capillary: 111 mg/dL — ABNORMAL HIGH (ref 70–99)
Glucose-Capillary: 112 mg/dL — ABNORMAL HIGH (ref 70–99)
Glucose-Capillary: 78 mg/dL (ref 70–99)
Glucose-Capillary: 82 mg/dL (ref 70–99)

## 2018-07-19 LAB — BLOOD GAS, ARTERIAL
Acid-base deficit: 1.3 mmol/L (ref 0.0–2.0)
Bicarbonate: 22.6 mmol/L (ref 20.0–28.0)
FIO2: 50
MECHVT: 740 mL
O2 Saturation: 99.2 %
PATIENT TEMPERATURE: 98.6
PCO2 ART: 35.6 mmHg (ref 32.0–48.0)
PEEP: 5 cmH2O
PH ART: 7.419 (ref 7.350–7.450)
Pressure support: 10 cmH2O
RATE: 26 resp/min
pO2, Arterial: 159 mmHg — ABNORMAL HIGH (ref 83.0–108.0)

## 2018-07-19 LAB — BASIC METABOLIC PANEL
Anion gap: 7 (ref 5–15)
BUN: 14 mg/dL (ref 8–23)
CO2: 22 mmol/L (ref 22–32)
CREATININE: 1.15 mg/dL (ref 0.61–1.24)
Calcium: 8 mg/dL — ABNORMAL LOW (ref 8.9–10.3)
Chloride: 110 mmol/L (ref 98–111)
GFR calc Af Amer: >60 mL/min (ref >60)
GLUCOSE: 185 mg/dL — AB (ref 70–99)
Potassium: 4.3 mmol/L (ref 3.5–5.1)
Sodium: 139 mmol/L (ref 135–145)

## 2018-07-19 LAB — POCT I-STAT, CHEM 8
BUN: 14 mg/dL (ref 8–23)
BUN: 12 mg/dL (ref 8–23)
CHLORIDE: 108 mmol/L (ref 98–111)
CHLORIDE: 100 mmol/L (ref 98–111)
CREATININE: 0.9 mg/dL (ref 0.61–1.24)
Calcium, Ion: 1.09 mmol/L — ABNORMAL LOW (ref 1.15–1.40)
Calcium, Ion: 1.15 mmol/L (ref 1.15–1.40)
Creatinine, Ser: 1.1 mg/dL (ref 0.61–1.24)
GLUCOSE: 190 mg/dL — AB (ref 70–99)
Glucose, Bld: 118 mg/dL — ABNORMAL HIGH (ref 70–99)
HEMATOCRIT: 37 % — AB (ref 39.0–52.0)
HEMATOCRIT: 33 % — AB (ref 39.0–52.0)
Hemoglobin: 12.6 g/dL — ABNORMAL LOW (ref 13.0–17.0)
Hemoglobin: 11.2 g/dL — ABNORMAL LOW (ref 13.0–17.0)
POTASSIUM: 3.9 mmol/L (ref 3.5–5.1)
Potassium: 3.6 mmol/L (ref 3.5–5.1)
Sodium: 143 mmol/L (ref 135–145)
Sodium: 138 mmol/L (ref 135–145)
TCO2: 18 mmol/L — ABNORMAL LOW (ref 22–32)
TCO2: 23 mmol/L (ref 22–32)

## 2018-07-19 LAB — CBC
HCT: 40.5 % (ref 39.0–52.0)
HEMATOCRIT: 36.2 % — AB (ref 39.0–52.0)
HEMOGLOBIN: 11.6 g/dL — AB (ref 13.0–17.0)
Hemoglobin: 12.9 g/dL — ABNORMAL LOW (ref 13.0–17.0)
MCH: 28.2 pg (ref 26.0–34.0)
MCH: 28.5 pg (ref 26.0–34.0)
MCHC: 31.9 g/dL (ref 30.0–36.0)
MCHC: 32 g/dL (ref 30.0–36.0)
MCV: 88.4 fL (ref 78.0–100.0)
MCV: 88.9 fL (ref 78.0–100.0)
PLATELETS: 149 10*3/uL — AB (ref 150–400)
Platelets: 116 10*3/uL — ABNORMAL LOW (ref 150–400)
RBC: 4.58 MIL/uL (ref 4.22–5.81)
RBC: 4.07 MIL/uL — AB (ref 4.22–5.81)
RDW: 15.5 % (ref 11.5–15.5)
RDW: 15.9 % — ABNORMAL HIGH (ref 11.5–15.5)
WBC: 19.1 10*3/uL — AB (ref 4.0–10.5)
WBC: 19.8 10*3/uL — AB (ref 4.0–10.5)

## 2018-07-19 LAB — CREATININE, SERUM
Creatinine, Ser: 0.95 mg/dL (ref 0.61–1.24)
GFR calc non Af Amer: >60 mL/min (ref >60)

## 2018-07-19 LAB — COOXEMETRY PANEL
Carboxyhemoglobin: 1.6 % — ABNORMAL HIGH (ref 0.5–1.5)
METHEMOGLOBIN: 1.8 % — AB (ref 0.0–1.5)
O2 SAT: 63.3 %
TOTAL HEMOGLOBIN: 11.9 g/dL — AB (ref 12.0–16.0)

## 2018-07-19 LAB — MAGNESIUM
Magnesium: 2.7 mg/dL — ABNORMAL HIGH (ref 1.7–2.4)
Magnesium: 2.2 mg/dL (ref 1.7–2.4)

## 2018-07-19 MED ORDER — FUROSEMIDE 10 MG/ML IJ SOLN
40.0000 mg | Freq: Two times a day (BID) | INTRAMUSCULAR | Status: DC
  Administered 2018-07-19 – 2018-07-20 (×3): 40 mg via INTRAVENOUS
  Filled 2018-07-19 (×3): qty 4

## 2018-07-19 MED ORDER — METOPROLOL TARTRATE 12.5 MG HALF TABLET
12.5000 mg | ORAL_TABLET | Freq: Two times a day (BID) | ORAL | Status: DC
  Administered 2018-07-19 – 2018-07-20 (×3): 12.5 mg via ORAL
  Filled 2018-07-19 (×3): qty 1

## 2018-07-19 MED ORDER — ORAL CARE MOUTH RINSE
15.0000 mL | Freq: Two times a day (BID) | OROMUCOSAL | Status: DC
  Administered 2018-07-22 – 2018-07-29 (×10): 15 mL via OROMUCOSAL

## 2018-07-19 MED ORDER — CHLORHEXIDINE GLUCONATE 0.12 % MT SOLN
15.0000 mL | Freq: Two times a day (BID) | OROMUCOSAL | Status: DC
  Administered 2018-07-19 (×2): 15 mL via OROMUCOSAL
  Filled 2018-07-19: qty 15

## 2018-07-19 MED ORDER — ENOXAPARIN SODIUM 40 MG/0.4ML ~~LOC~~ SOLN
40.0000 mg | Freq: Every day | SUBCUTANEOUS | Status: DC
  Administered 2018-07-19 – 2018-07-20 (×2): 40 mg via SUBCUTANEOUS
  Filled 2018-07-19 (×2): qty 0.4

## 2018-07-19 MED ORDER — METOPROLOL TARTRATE 12.5 MG HALF TABLET: 12.5000 mg | ORAL_TABLET | Freq: Two times a day (BID) | ORAL | Status: DC

## 2018-07-19 NOTE — Progress Notes (Signed)
Rapid wean initiated.

## 2018-07-19 NOTE — Progress Notes (Signed)
      [address]       [place] [address]             [phone]      Resting comfortably  BP (!) 118/53 (BP Location: Right Arm)   Pulse (!) 103   Temp 99.3 F (37.4 C) (Core (Comment)) Comment (Src): swan  Resp (!) 26   Wt (!) 143.2 kg   SpO2 98%   BMI 44.03 kg/m   Intake/Output Summary (Last 24 hours) at 07/19/2018 1827 Last data filed at 07/19/2018 1800 Gross per 24 hour  Intake 6093.34 ml  Output 2572 ml  Net 3521.34 ml   Off neo and levophed,  CI 2.6 with PA 60/30 on milrinone 0.25, epi @ 2 Frequent 3-5 beat episodes of ventricular tachy- will dc swan K= 3.9- supplement, Mg= 2.2  Overall doing well POD # 1  Marisabel Macpherson C. Roxan Hockey, MD [facility] [phone]

## 2018-07-19 NOTE — Procedures (Signed)
Extubation Procedure Note  Patient Details:   Name: Jeffrey Larson DOB: 1956/11/07 MRN: [id number]   Airway Documentation: Patient extubated to 4 lpm nasal cannula.  VC 1500 ml, NIF -25, patient able to hold head off bed.  Patient able to speak post procedure, no complications.   Vent end date: 07/19/18 Vent end time: 0610   Evaluation  O2 sats: stable throughout Complications: No apparent complications Patient did tolerate procedure well. Bilateral Breath Sounds: Clear, Diminished   Yes  Aviyah Swetz, Elwyn Lade 07/19/2018, 6:16 AM

## 2018-07-19 NOTE — Progress Notes (Signed)
Anesthesiology Follow-up:  Awake and alert, neuro intact, having some incisional pain, sitting in bed.  Hemodynamically stable with milrinone 0.25 mcg/kg/min Epi 3 mcg/min and Norepi 4 mcg/min  VS: T- 37.4 BP- 137/53 HR- 80 (afib) RR- 24 O2 Sat 98% on 4L PA 67/25 CO/CI- 7.1/2.8  K-4.3 Na- 139 BUN/CR 14.1.15 glucose- 128 H/H- 12.9/40.5 Platelets-149,000  Extubated at 06:10 today. 62 year old male one day S/P MVR, Maze procedure, and LAA clipping for severe MS with persistent Afib, and severe pulmonary hypertension.  Stable post-op course, he appears to be in Afib. Otherwise hemodynamically stable.  Jeffrey Larson

## 2018-07-19 NOTE — Progress Notes (Signed)
Minette Brine, RN reported that after calling Dr Roxan Hockey, He stated to keep pt intubated overnight and repeat ABG at 0500. Will continue to monitor pt.

## 2018-07-19 NOTE — Progress Notes (Signed)
1 Day Post-Op Procedure(s) (LRB): MITRAL VALVE (MV) REPLACEMENT WITH CARBOMEDICS OPTIFORM MITRAL VALVE SIZE 33. (N/A) MAZE (N/A) TRICUSPID VALVE REPAIR WITH EDWARDS MC3 TRICUSPID ANNULOPLASTY RING SIZE 30. (N/A) TRANSESOPHAGEAL ECHOCARDIOGRAM (TEE) (N/A) Subjective: C/o soreness Extubated early this AM  Objective: Vital signs in last 24 hours: Temp:  [99.3 F (37.4 C)-101.1 F (38.4 C)] 99.7 F (37.6 C) (08/24 0930) Pulse Rate:  [29-91] 63 (08/24 0930) Cardiac Rhythm: Ventricular paced (08/24 0800) Resp:  [9-28] 24 (08/24 0930) BP: (96-121)/(49-80) 120/56 (08/24 0930) SpO2:  [96 %-100 %] 98 % (08/24 0930) Arterial Line BP: (95-210)/(43-204) 147/55 (08/24 0930) FiO2 (%):  [40 %-50 %] 40 % (08/24 0530) Weight:  [143.2 kg] 143.2 kg (08/24 0656)  Hemodynamic parameters for last 24 hours: PAP: (45-77)/(24-39) 69/27 CO:  [4.1 L/min-8.5 L/min] 7.6 L/min CI:  [1.7 L/min/m2-3.4 L/min/m2] 3 L/min/m2  Intake/Output from previous day: 08/23 0701 - 08/24 0700 In: 8337.6 [I.V.:6211.2; Blood:850; IV Piggyback:1276.4] Out: 1610 [Urine:2460; Blood:950; Chest Tube:538] Intake/Output this shift: Total I/O In: 325.3 [I.V.:311.2; IV Piggyback:14.1] Out: 207 [Urine:127; Chest Tube:80]  General appearance: alert, cooperative and no distress Neurologic: intact Heart: irregularly irregular rhythm Lungs: diminished breath sounds bibasilar Abdomen: distended,nontendr  Lab Results: Recent Labs    07/18/18 2129 07/18/18 2139 07/19/18 0417  WBC 25.3*  --  19.1*  HGB 13.4 12.6* 12.9*  HCT 42.4 37.0* 40.5  PLT 171  --  149*   BMET:  Recent Labs    07/18/18 2139 07/19/18 0417  NA 143 139  K 3.6 4.3  CL 108 110  CO2  --  22  GLUCOSE 190* 185*  BUN 14 14  CREATININE 1.10 1.15  CALCIUM  --  8.0*    PT/INR:  Recent Labs    07/18/18 1643  LABPROT 17.6*  INR 1.46   ABG    Component Value Date/Time   PHART 7.369 07/19/2018 0718   HCO3 20.9 07/19/2018 0718   TCO2 22 07/19/2018  0718   ACIDBASEDEF 4.0 (H) 07/19/2018 0718   O2SAT 97.0 07/19/2018 0718   CBG (last 3)  Recent Labs    07/19/18 0528 07/19/18 0638 07/19/18 0723  GLUCAP 156* 138* 127*    Assessment/Plan: S/P Procedure(s) (LRB): MITRAL VALVE (MV) REPLACEMENT WITH CARBOMEDICS OPTIFORM MITRAL VALVE SIZE 33. (N/A) MAZE (N/A) TRICUSPID VALVE REPAIR WITH EDWARDS MC3 TRICUSPID ANNULOPLASTY RING SIZE 30. (N/A) TRANSESOPHAGEAL ECHOCARDIOGRAM (TEE) (N/A) -  CV- s/p MVR, TV repair, maze  Excellent cardiac index  PA pressures elevated, but less so than preop  Will decrease milrinone to 0.25, wean neo and levophed, keep Epi for now  Keep Swan and A line for now  RESP- left lower lobe atelectasis- IS  RENAL- creatinine and lytes oK  Diurese  ENDO- CBG controlled with insulin drip- still on a high dose- continue glucommander protocol for now  DC mediastinal CT  Advance diet   LOS: 1 day    Jeffrey Larson 07/19/2018

## 2018-07-19 NOTE — Addendum Note (Signed)
Addendum  created 07/19/18 1054 by Roberts Gaudy, MD   Flowsheet accepted, Sign clinical note

## 2018-07-20 ENCOUNTER — Inpatient Hospital Stay (HOSPITAL_COMMUNITY): Payer: BLUE CROSS/BLUE SHIELD

## 2018-07-20 LAB — GLUCOSE, CAPILLARY
GLUCOSE-CAPILLARY: 105 mg/dL — AB (ref 70–99)
GLUCOSE-CAPILLARY: 110 mg/dL — AB (ref 70–99)
GLUCOSE-CAPILLARY: 111 mg/dL — AB (ref 70–99)
GLUCOSE-CAPILLARY: 115 mg/dL — AB (ref 70–99)
GLUCOSE-CAPILLARY: 130 mg/dL — AB (ref 70–99)
GLUCOSE-CAPILLARY: 135 mg/dL — AB (ref 70–99)
GLUCOSE-CAPILLARY: 139 mg/dL — AB (ref 70–99)
GLUCOSE-CAPILLARY: 142 mg/dL — AB (ref 70–99)
GLUCOSE-CAPILLARY: 143 mg/dL — AB (ref 70–99)
Glucose-Capillary: 123 mg/dL — ABNORMAL HIGH (ref 70–99)
Glucose-Capillary: 139 mg/dL — ABNORMAL HIGH (ref 70–99)
Glucose-Capillary: 153 mg/dL — ABNORMAL HIGH (ref 70–99)
Glucose-Capillary: 159 mg/dL — ABNORMAL HIGH (ref 70–99)
Glucose-Capillary: 158 mg/dL — ABNORMAL HIGH (ref 70–99)
Glucose-Capillary: 148 mg/dL — ABNORMAL HIGH (ref 70–99)
Glucose-Capillary: 140 mg/dL — ABNORMAL HIGH (ref 70–99)

## 2018-07-20 LAB — CBC
HEMATOCRIT: 34.6 % — AB (ref 39.0–52.0)
Hemoglobin: 11 g/dL — ABNORMAL LOW (ref 13.0–17.0)
MCH: 28.6 pg (ref 26.0–34.0)
MCHC: 31.8 g/dL (ref 30.0–36.0)
MCV: 89.9 fL (ref 78.0–100.0)
PLATELETS: 122 10*3/uL — AB (ref 150–400)
RBC: 3.85 MIL/uL — ABNORMAL LOW (ref 4.22–5.81)
RDW: 16.5 % — AB (ref 11.5–15.5)
WBC: 21.7 10*3/uL — ABNORMAL HIGH (ref 4.0–10.5)

## 2018-07-20 LAB — COOXEMETRY PANEL
CARBOXYHEMOGLOBIN: 1.8 % — AB (ref 0.5–1.5)
Methemoglobin: 1.7 % — ABNORMAL HIGH (ref 0.0–1.5)
O2 Saturation: 66.7 %
Total hemoglobin: 11.5 g/dL — ABNORMAL LOW (ref 12.0–16.0)

## 2018-07-20 LAB — BASIC METABOLIC PANEL
Anion gap: 7 (ref 5–15)
BUN: 13 mg/dL (ref 8–23)
CHLORIDE: 100 mmol/L (ref 98–111)
CO2: 26 mmol/L (ref 22–32)
CREATININE: 1.06 mg/dL (ref 0.61–1.24)
Calcium: 8.1 mg/dL — ABNORMAL LOW (ref 8.9–10.3)
GFR calc Af Amer: >60 mL/min (ref >60)
GFR calc non Af Amer: >60 mL/min (ref >60)
GLUCOSE: 143 mg/dL — AB (ref 70–99)
POTASSIUM: 4.3 mmol/L (ref 3.5–5.1)
SODIUM: 133 mmol/L — AB (ref 135–145)

## 2018-07-20 MED ORDER — INSULIN ASPART 100 UNIT/ML ~~LOC~~ SOLN
0.0000 [IU] | SUBCUTANEOUS | Status: DC
  Administered 2018-07-20 (×2): 3 [IU] via SUBCUTANEOUS

## 2018-07-20 MED ORDER — ASPIRIN EC 81 MG PO TBEC
81.0000 mg | DELAYED_RELEASE_TABLET | Freq: Every day | ORAL | Status: DC
  Administered 2018-07-21 – 2018-07-31 (×11): 81 mg via ORAL
  Filled 2018-07-20 (×11): qty 1

## 2018-07-20 MED ORDER — INSULIN DETEMIR 100 UNIT/ML ~~LOC~~ SOLN: 25.0000 [IU] | Freq: Every day | SUBCUTANEOUS | Status: DC

## 2018-07-20 MED ORDER — FUROSEMIDE 10 MG/ML IJ SOLN
4.0000 mg/h | INTRAVENOUS | Status: DC
  Administered 2018-07-20: 8 mg/h via INTRAVENOUS
  Administered 2018-07-21 – 2018-07-22 (×2): 10 mg/h via INTRAVENOUS
  Filled 2018-07-20: qty 20
  Filled 2018-07-20 (×3): qty 25

## 2018-07-20 MED ORDER — WARFARIN - PHYSICIAN DOSING INPATIENT
Freq: Every day | Status: DC
  Administered 2018-07-20 – 2018-07-30 (×6)

## 2018-07-20 MED ORDER — INSULIN DETEMIR 100 UNIT/ML ~~LOC~~ SOLN
25.0000 [IU] | Freq: Every day | SUBCUTANEOUS | Status: DC
  Administered 2018-07-20 – 2018-07-22 (×3): 25 [IU] via SUBCUTANEOUS
  Filled 2018-07-20 (×5): qty 0.25

## 2018-07-20 MED ORDER — WARFARIN SODIUM 2 MG PO TABS
4.0000 mg | ORAL_TABLET | Freq: Every day | ORAL | Status: DC
  Administered 2018-07-20 – 2018-07-22 (×3): 4 mg via ORAL
  Filled 2018-07-20 (×4): qty 2

## 2018-07-20 MED ORDER — FUROSEMIDE 10 MG/ML IJ SOLN: 80.0000 mg | Freq: Two times a day (BID) | INTRAMUSCULAR | Status: DC

## 2018-07-20 MED ORDER — ZOLPIDEM TARTRATE 5 MG PO TABS: 5.0000 mg | ORAL_TABLET | Freq: Every evening | ORAL | Status: DC | PRN

## 2018-07-20 MED ORDER — METOLAZONE 5 MG PO TABS
10.0000 mg | ORAL_TABLET | Freq: Once | ORAL | Status: AC
  Administered 2018-07-20: 10 mg via ORAL
  Filled 2018-07-20: qty 2

## 2018-07-20 MED ORDER — FUROSEMIDE 10 MG/ML IJ SOLN
40.0000 mg | Freq: Once | INTRAMUSCULAR | Status: AC
  Administered 2018-07-20: 40 mg via INTRAVENOUS

## 2018-07-20 NOTE — Progress Notes (Signed)
2 Days Post-Op Procedure(s) (LRB): MITRAL VALVE (MV) REPLACEMENT WITH CARBOMEDICS OPTIFORM MITRAL VALVE SIZE 33. (N/A) MAZE (N/A) TRICUSPID VALVE REPAIR WITH EDWARDS MC3 TRICUSPID ANNULOPLASTY RING SIZE 30. (N/A) TRANSESOPHAGEAL ECHOCARDIOGRAM (TEE) (N/A) Subjective: C/o of not being able to sleep last night   Objective: Vital signs in last 24 hours: Temp:  [98.4 F (36.9 C)-99.5 F (37.5 C)] 98.7 F (37.1 C) (08/25 0746) Pulse Rate:  [37-178] 89 (08/25 0800) Cardiac Rhythm: Ventricular paced (08/25 0730) Resp:  [16-29] 26 (08/25 0800) BP: (85-119)/(49-70) 88/61 (08/25 0800) SpO2:  [94 %-100 %] 97 % (08/25 0800) Arterial Line BP: (103-178)/(45-62) 131/54 (08/25 0800) Weight:  [144.4 kg] 144.4 kg (08/25 0600)  Hemodynamic parameters for last 24 hours: PAP: (59-97)/(21-37) 59/31 CVP:  [35 mmHg] 35 mmHg CO:  [5.4 L/min-9.3 L/min] 6.9 L/min CI:  [2.2 L/min/m2-3.7 L/min/m2] 2.8 L/min/m2  Intake/Output from previous day: 08/24 0701 - 08/25 0700 In: 3021.1 [P.O.:820; I.V.:2154.1; IV Piggyback:47] Out: 2489 [Urine:2124; Chest Tube:365] Intake/Output this shift: Total I/O In: -  Out: 220 [Urine:200; Chest Tube:20]  General appearance: alert, cooperative and no distress Neurologic: intact Heart: regular rate and rhythm Lungs: diminished breath sounds bibasilar and increased work of breathing Abdomen: normal findings: soft, non-tender  Lab Results: Recent Labs    07/19/18 1601 07/19/18 1602 07/20/18 0356  WBC 19.8*  --  21.7*  HGB 11.6* 11.2* 11.0*  HCT 36.2* 33.0* 34.6*  PLT 116*  --  122*   BMET:  Recent Labs    07/19/18 0417  07/19/18 1602 07/20/18 0356  NA 139  --  138 133*  K 4.3  --  3.9 4.3  CL 110  --  100 100  CO2 22  --   --  26  GLUCOSE 185*  --  118* 143*  BUN 14  --  12 13  CREATININE 1.15   < > 0.90 1.06  CALCIUM 8.0*  --   --  8.1*   < > = values in this interval not displayed.    PT/INR:  Recent Labs    07/18/18 1643  LABPROT 17.6*  INR  1.46   ABG    Component Value Date/Time   PHART 7.369 07/19/2018 0718   HCO3 20.9 07/19/2018 0718   TCO2 23 07/19/2018 1602   ACIDBASEDEF 4.0 (H) 07/19/2018 0718   O2SAT 66.7 07/20/2018 0408   CBG (last 3)  Recent Labs    07/20/18 0654 07/20/18 0751 07/20/18 0852  GLUCAP 110* 159* 158*    Assessment/Plan: S/P Procedure(s) (LRB): MITRAL VALVE (MV) REPLACEMENT WITH CARBOMEDICS OPTIFORM MITRAL VALVE SIZE 33. (N/A) MAZE (N/A) TRICUSPID VALVE REPAIR WITH EDWARDS MC3 TRICUSPID ANNULOPLASTY RING SIZE 30. (N/A) TRANSESOPHAGEAL ECHOCARDIOGRAM (TEE) (N/A) -Cv- in atrial fib under pacer  Co-ox 67 on milrinone and norepi  Restart coumadin  RESP- has atelectasis and is wet- will diurese as tolerated  IS  RENAL- creatinine is normal and lytes OK  Volume overloaded, will try lasix drip for diuresis  ENDO- still on insulin drip- will transition to levemir + SSI  Mobilize as tolerated   LOS: 2 days    Melrose Nakayama 07/20/2018

## 2018-07-20 NOTE — Progress Notes (Signed)
Hendrickson MD was paged about patient complaining of shortness of breath and crackles upon assessment. Urine output less than 30cc/hr and continued ectopy. MD gave orders for Lasix. Will continue to monitor.

## 2018-07-20 NOTE — Progress Notes (Signed)
      [address]       [place] [address]             [phone]      Stable day, sat up for 6 hours  BP 126/68 (BP Location: Right Arm)   Pulse 98   Temp 97.8 F (36.6 C) (Oral)   Resp (!) 23   Wt (!) 144.4 kg   SpO2 94%   BMI 44.40 kg/m    Intake/Output Summary (Last 24 hours) at 07/20/2018 1851 Last data filed at 07/20/2018 1600 Gross per 24 hour  Intake 1462.91 ml  Output 1635 ml  Net -172.09 ml   Modest response to lasix- will increase to 10 and give a dose of metazolone as well  Remo Lipps C. Roxan Hockey, MD [facility] [phone]

## 2018-07-21 ENCOUNTER — Inpatient Hospital Stay (HOSPITAL_COMMUNITY): Payer: BLUE CROSS/BLUE SHIELD

## 2018-07-21 ENCOUNTER — Inpatient Hospital Stay: Payer: Self-pay

## 2018-07-21 LAB — CBC
HEMATOCRIT: 34.5 % — AB (ref 39.0–52.0)
HEMATOCRIT: 34.2 % — AB (ref 39.0–52.0)
Hemoglobin: 11.1 g/dL — ABNORMAL LOW (ref 13.0–17.0)
Hemoglobin: 11 g/dL — ABNORMAL LOW (ref 13.0–17.0)
MCH: 28.8 pg (ref 26.0–34.0)
MCH: 28.4 pg (ref 26.0–34.0)
MCHC: 32.2 g/dL (ref 30.0–36.0)
MCHC: 32.2 g/dL (ref 30.0–36.0)
MCV: 89.6 fL (ref 78.0–100.0)
MCV: 88.1 fL (ref 78.0–100.0)
Platelets: 124 10*3/uL — ABNORMAL LOW (ref 150–400)
Platelets: 124 10*3/uL — ABNORMAL LOW (ref 150–400)
RBC: 3.85 MIL/uL — ABNORMAL LOW (ref 4.22–5.81)
RBC: 3.88 MIL/uL — ABNORMAL LOW (ref 4.22–5.81)
RDW: 16.5 % — ABNORMAL HIGH (ref 11.5–15.5)
RDW: 16.5 % — AB (ref 11.5–15.5)
WBC: 11.5 10*3/uL — ABNORMAL HIGH (ref 4.0–10.5)
WBC: 8.4 10*3/uL (ref 4.0–10.5)

## 2018-07-21 LAB — POCT I-STAT, CHEM 8
BUN: 22 mg/dL (ref 8–23)
Calcium, Ion: 1.16 mmol/L (ref 1.15–1.40)
Chloride: 92 mmol/L — ABNORMAL LOW (ref 98–111)
Creatinine, Ser: 1.2 mg/dL (ref 0.61–1.24)
Glucose, Bld: 98 mg/dL (ref 70–99)
HCT: 33 % — ABNORMAL LOW (ref 39.0–52.0)
Hemoglobin: 11.2 g/dL — ABNORMAL LOW (ref 13.0–17.0)
Potassium: 3.7 mmol/L (ref 3.5–5.1)
Sodium: 130 mmol/L — ABNORMAL LOW (ref 135–145)
TCO2: 27 mmol/L (ref 22–32)

## 2018-07-21 LAB — BASIC METABOLIC PANEL
Anion gap: 10 (ref 5–15)
Anion gap: 10 (ref 5–15)
BUN: 18 mg/dL (ref 8–23)
BUN: 21 mg/dL (ref 8–23)
CHLORIDE: 95 mmol/L — AB (ref 98–111)
CHLORIDE: 93 mmol/L — AB (ref 98–111)
CO2: 27 mmol/L (ref 22–32)
CO2: 29 mmol/L (ref 22–32)
CREATININE: 1.08 mg/dL (ref 0.61–1.24)
CREATININE: 1.09 mg/dL (ref 0.61–1.24)
Calcium: 8.3 mg/dL — ABNORMAL LOW (ref 8.9–10.3)
Calcium: 8.5 mg/dL — ABNORMAL LOW (ref 8.9–10.3)
GFR calc Af Amer: >60 mL/min (ref >60)
GFR calc non Af Amer: >60 mL/min (ref >60)
GFR calc non Af Amer: >60 mL/min (ref >60)
GLUCOSE: 96 mg/dL (ref 70–99)
Glucose, Bld: 136 mg/dL — ABNORMAL HIGH (ref 70–99)
Potassium: 3.5 mmol/L (ref 3.5–5.1)
Potassium: 3.7 mmol/L (ref 3.5–5.1)
Sodium: 132 mmol/L — ABNORMAL LOW (ref 135–145)
Sodium: 132 mmol/L — ABNORMAL LOW (ref 135–145)

## 2018-07-21 LAB — GLUCOSE, CAPILLARY
Glucose-Capillary: 117 mg/dL — ABNORMAL HIGH (ref 70–99)
Glucose-Capillary: 152 mg/dL — ABNORMAL HIGH (ref 70–99)
Glucose-Capillary: 132 mg/dL — ABNORMAL HIGH (ref 70–99)
Glucose-Capillary: 76 mg/dL (ref 70–99)
Glucose-Capillary: 123 mg/dL — ABNORMAL HIGH (ref 70–99)

## 2018-07-21 LAB — COOXEMETRY PANEL
Carboxyhemoglobin: 2.5 % — ABNORMAL HIGH (ref 0.5–1.5)
Methemoglobin: 1.7 % — ABNORMAL HIGH (ref 0.0–1.5)
O2 Saturation: 68.5 %
TOTAL HEMOGLOBIN: 11.6 g/dL — AB (ref 12.0–16.0)

## 2018-07-21 LAB — PROTIME-INR
INR: 1.43
Prothrombin Time: 17.3 seconds — ABNORMAL HIGH (ref 11.4–15.2)

## 2018-07-21 LAB — MAGNESIUM: Magnesium: 2.1 mg/dL (ref 1.7–2.4)

## 2018-07-21 MED ORDER — SPIRONOLACTONE 25 MG PO TABS
25.0000 mg | ORAL_TABLET | Freq: Every day | ORAL | Status: DC
  Administered 2018-07-21 – 2018-07-31 (×11): 25 mg via ORAL
  Filled 2018-07-21 (×11): qty 1

## 2018-07-21 MED ORDER — SODIUM CHLORIDE 0.9% FLUSH: 10.0000 mL | INTRAVENOUS | Status: DC | PRN

## 2018-07-21 MED ORDER — INSULIN ASPART 100 UNIT/ML ~~LOC~~ SOLN
0.0000 [IU] | Freq: Three times a day (TID) | SUBCUTANEOUS | Status: DC
  Administered 2018-07-21 – 2018-07-24 (×4): 2 [IU] via SUBCUTANEOUS

## 2018-07-21 MED ORDER — POTASSIUM CHLORIDE 10 MEQ/50ML IV SOLN
10.0000 meq | INTRAVENOUS | Status: AC
  Administered 2018-07-21 (×2): 10 meq via INTRAVENOUS
  Filled 2018-07-21 (×2): qty 50

## 2018-07-21 MED ORDER — POTASSIUM CHLORIDE 10 MEQ/50ML IV SOLN
10.0000 meq | INTRAVENOUS | Status: AC | PRN
  Administered 2018-07-21 (×3): 10 meq via INTRAVENOUS
  Filled 2018-07-21 (×2): qty 50

## 2018-07-21 MED ORDER — CHLORHEXIDINE GLUCONATE CLOTH 2 % EX PADS
6.0000 | MEDICATED_PAD | Freq: Every day | CUTANEOUS | Status: DC
  Administered 2018-07-21: 6 via TOPICAL

## 2018-07-21 MED ORDER — POTASSIUM CHLORIDE 10 MEQ/50ML IV SOLN
INTRAVENOUS | Status: AC
  Administered 2018-07-21: 10 meq
  Filled 2018-07-21: qty 100

## 2018-07-21 MED ORDER — MOVING RIGHT ALONG BOOK
Freq: Once | Status: AC
  Administered 2018-07-21: 08:00:00
  Filled 2018-07-21: qty 1

## 2018-07-21 MED ORDER — ATORVASTATIN CALCIUM 40 MG PO TABS
40.0000 mg | ORAL_TABLET | Freq: Every evening | ORAL | Status: DC
  Administered 2018-07-21 – 2018-07-30 (×10): 40 mg via ORAL
  Filled 2018-07-21 (×10): qty 1

## 2018-07-21 MED ORDER — SODIUM CHLORIDE 0.9% FLUSH
10.0000 mL | Freq: Two times a day (BID) | INTRAVENOUS | Status: DC
  Administered 2018-07-22 – 2018-07-30 (×13): 10 mL
  Administered 2018-07-30: 20 mL

## 2018-07-21 MED ORDER — AMIODARONE HCL 200 MG PO TABS
400.0000 mg | ORAL_TABLET | Freq: Two times a day (BID) | ORAL | Status: DC
  Administered 2018-07-21 – 2018-07-22 (×4): 400 mg via ORAL
  Filled 2018-07-21 (×4): qty 2

## 2018-07-21 MED ORDER — POTASSIUM CHLORIDE 10 MEQ/50ML IV SOLN
10.0000 meq | INTRAVENOUS | Status: AC | PRN
  Administered 2018-07-21 – 2018-07-22 (×4): 10 meq via INTRAVENOUS
  Filled 2018-07-21 (×4): qty 50

## 2018-07-21 MED FILL — Sodium Chloride IV Soln 0.9%: INTRAVENOUS | Qty: 2000 | Status: AC

## 2018-07-21 MED FILL — Lidocaine HCl(Cardiac) IV PF Soln Pref Syr 100 MG/5ML (2%): INTRAVENOUS | Qty: 25 | Status: AC

## 2018-07-21 MED FILL — Electrolyte-R (PH 7.4) Solution: INTRAVENOUS | Qty: 3000 | Status: AC

## 2018-07-21 MED FILL — Mannitol IV Soln 20%: INTRAVENOUS | Qty: 500 | Status: AC

## 2018-07-21 MED FILL — Sodium Bicarbonate IV Soln 8.4%: INTRAVENOUS | Qty: 50 | Status: AC

## 2018-07-21 MED FILL — Heparin Sodium (Porcine) Inj 1000 Unit/ML: INTRAMUSCULAR | Qty: 10 | Status: AC

## 2018-07-21 NOTE — Evaluation (Signed)
Physical Therapy Evaluation Patient Details Name: Jeffrey Larson MRN: [id number] DOB: September 11, 1956 Today's Date: 07/21/2018   History of Present Illness  62 yo admitted for MVR and tricuspid valve repair 8/23 with pulmonary HTN. PMhx: CAD, MI, CHF, COPD, HLD, obesity  Clinical Impression  Pt very pleasant in chair on arrival and able to stand with momentum and 2 person assist. Pt walked in hall with gait limited by leaking central line. Pt with decreased strength, transfers, gait and adherence to precautions who will benefit from acute therapy to maximize mobility, function and independence while maintaining sternal precautions. Handout provided and educated for mobility with precautions provided.  HR 68-73 SpO2 100% on 2L, 95% on RA with drop to 88% with walking to door, returned to 2L with sats 95%     Follow Up Recommendations Home health PT;Supervision/Assistance - 24 hour    Equipment Recommendations  3in1 (PT)    Recommendations for Other Services       Precautions / Restrictions Precautions Precautions: Sternal;Fall Precaution Booklet Issued: Yes (comment) Restrictions Weight Bearing Restrictions: Yes(sternal precautions)      Mobility  Bed Mobility               General bed mobility comments: in chair on arrival  Transfers Overall transfer level: Needs assistance   Transfers: Sit to/from Stand Sit to Stand: Mod assist;+2 physical assistance         General transfer comment: mod assist with cues for hand placement to rise from surface and pt able to scoot forward and back in chair without assist or use of bil UE  Ambulation/Gait Ambulation/Gait assistance: Min guard Gait Distance (Feet): 90 Feet Assistive device: Rolling walker (2 wheeled) Gait Pattern/deviations: Step-through pattern;Decreased stride length   Gait velocity interpretation: >2.62 ft/sec, indicative of community ambulatory General Gait Details: pt with cues for posture and RW use, gait  limited by central line leaking and return to room for RN to address. Encouraged additional gait trials later today with nursing  Stairs            Wheelchair Mobility    Modified Rankin (Stroke Patients Only)       Balance Overall balance assessment: Needs assistance   Sitting balance-Leahy Scale: Good       Standing balance-Leahy Scale: Poor                               Pertinent Vitals/Pain Pain Location: incision site  Pain Descriptors / Indicators: Discomfort;Sore Pain Intervention(s): Monitored during session;Repositioned;Limited activity within patient's tolerance    Home Living Family/patient expects to be discharged to:: Private residence Living Arrangements: Spouse/significant other Available Help at Discharge: Family(wife, son also staying with for 3 weeks. 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, newspaper        Extremity/Trunk Assessment   Upper Extremity Assessment Upper Extremity Assessment: Generalized weakness    Lower Extremity Assessment Lower Extremity Assessment: Generalized weakness    Cervical / Trunk Assessment Cervical / Trunk Assessment: Kyphotic  Communication   Communication: No difficulties  Cognition Arousal/Alertness: Awake/alert Behavior During Therapy: WFL for tasks  assessed/performed Overall Cognitive Status: Within Functional Limits for tasks assessed                                 General Comments: pt able to recall keeping arms in and no pushing end of session      General Comments      Exercises     Assessment/Plan    PT Assessment Patient needs continued PT services  PT  Problem List Decreased mobility;Decreased activity tolerance;Decreased balance;Decreased knowledge of use of DME;Cardiopulmonary status limiting activity;Decreased knowledge of precautions;Obesity       PT Treatment Interventions Gait training;Therapeutic exercise;Patient/family education;Stair training;Functional mobility training;DME instruction;Therapeutic activities    PT Goals (Current goals can be found in the Care Plan section)  Acute Rehab PT Goals Patient Stated Goal: return to horseback riding and camping PT Goal Formulation: With patient Time For Goal Achievement: 08/04/18 Potential to Achieve Goals: Good    Frequency Min 3X/week   Barriers to discharge        Co-evaluation               AM-PAC PT "6 Clicks" Daily Activity  Outcome Measure Difficulty turning over in bed (including adjusting bedclothes, sheets and blankets)?: A Lot Difficulty moving from lying on back to sitting on the side of the bed? : Unable Difficulty sitting down on and standing up from a chair with arms (e.g., wheelchair, bedside commode, etc,.)?: Unable Help needed moving to and from a bed to chair (including a wheelchair)?: A Little Help needed walking in hospital room?: A Little Help needed climbing 3-5 steps with a railing? : A Lot 6 Click Score: 12    End of Session Equipment Utilized During Treatment: Gait belt;Oxygen Activity Tolerance: Patient tolerated treatment well Patient left: in chair;with call bell/phone within reach;with family/visitor present;with nursing/sitter in room Nurse Communication: Mobility status;Precautions PT Visit Diagnosis: Other abnormalities of gait and mobility (R26.89);Muscle weakness (generalized) (M62.81)    Time: 9163-8466 PT Time Calculation (min) (ACUTE ONLY): 23 min   Charges:   PT Evaluation $PT Eval Moderate Complexity: 1 Mod PT Treatments $Therapeutic Activity: 8-22 mins        Elwyn Reach, PT [phone]   Sandy Salaam  Adriannah Steinkamp 07/21/2018, 11:52 AM

## 2018-07-21 NOTE — Progress Notes (Signed)
Pt IJ leaking from insertion site. Ricard Dillon MD notified. Orders given to take out introducer and if that does not stop insertion site from leaking then orders given to place PICC line. Will continue to monitor.

## 2018-07-21 NOTE — Progress Notes (Signed)
   07/21/18 1000  CVC Double Lumen 07/18/18 Left Internal jugular 16 cm  Placement Date/Time: 07/18/18 (c) 0930   Time Out: Correct patient;Correct site;Correct procedure  Maximum sterile barrier precautions: Hand hygiene;Cap;Mask;Sterile gown;Sterile gloves;Large sterile sheet  Site Prep: Chlorhexidine  Local Anesthetic: ...  Indication for Insertion or Continuance of Line Vasoactive infusions  Site Assessment Clean;Dry;Intact  Dressing Type Transparent;Occlusive  Dressing Status Clean;Dry;Intact;Antimicrobial disc in place  Line Care Connections checked and tightened;Zeroed and calibrated;Leveled  Dressing Intervention Dressing changed;Antimicrobial disc changed  Introducer 07/18/18 Internal Jugular Left  Placement Date/Time: 07/18/18 (c) [id number]   Site Prep: Chlorhexidine  Maximum sterile barrier precautions: Hand hygiene;Cap;Mask;Sterile gown;Sterile gloves;Large sterile sheet  Location: Internal Jugular  Orientation: Left  Size: 8.5FR Single Lumen  Pos...  Specific Qualities Capped  Dressing Type Transparent;Occlusive  Dressing Status Clean;Dry;Intact  Interventions Dressing changed   While patient was ambulating with PT, central line dressing noted to be saturated. Patient placed in chair and dressing changed with sterile technique. No active leaking noted. After a few minutes, dressing noted to be saturated. Patient moved to bed. CXR ordered. After CXR completed, all lumens flushed and leaking was noticed from IJ central line. Medications moved to IJ introducer line. Dressing changed with sterile technique. Will update MD upon his arrival.

## 2018-07-21 NOTE — Progress Notes (Signed)
Peripherally Inserted Central Catheter/Midline Placement  The IV Nurse has discussed with the patient and/or persons authorized to consent for the patient, the purpose of this procedure and the potential benefits and risks involved with this procedure.  The benefits include less needle sticks, lab draws from the catheter, and the patient may be discharged home with the catheter. Risks include, but not limited to, infection, bleeding, blood clot (thrombus formation), and puncture of an artery; nerve damage and irregular heartbeat and possibility to perform a PICC exchange if needed/ordered by physician.  Alternatives to this procedure were also discussed.  Bard Power PICC patient education guide, fact sheet on infection prevention and patient information card has been provided to patient /or left at bedside.  Consent signed by daughter at patient request.    PICC/Midline Placement Documentation  PICC Double Lumen 33/00/76 PICC Right Basilic 51 cm 0 cm (Active)  Indication for Insertion or Continuance of Line Limited venous access - need for IV therapy >5 days (PICC only) 07/21/2018  6:26 PM  Exposed Catheter (cm) 0 cm 07/21/2018  6:26 PM  Site Assessment Clean;Dry;Intact 07/21/2018  6:26 PM  Lumen #1 Status Flushed;Saline locked;Blood return noted 07/21/2018  6:26 PM  Lumen #2 Status Flushed;Saline locked;Blood return noted 07/21/2018  6:26 PM  Dressing Status Clean;Dry;Intact 07/21/2018  6:26 PM  Dressing Intervention New dressing 07/21/2018  6:26 PM  Dressing Change Due 07/28/18 07/21/2018  6:26 PM       Jolynn Bajorek, Nicolette Bang 07/21/2018, 6:27 PM

## 2018-07-21 NOTE — Progress Notes (Signed)
During assessment pt found to have penial swelling. Ricard Dillon MD notified and orders given to keep foley in place. Will continue to monitor.

## 2018-07-21 NOTE — Progress Notes (Signed)
[address]       [place] [address]             [phone]        CARDIOTHORACIC SURGERY PROGRESS NOTE   R3 Days Post-Op Procedure(s) (LRB): MITRAL VALVE (MV) REPLACEMENT WITH CARBOMEDICS OPTIFORM MITRAL VALVE SIZE 33. (N/A) MAZE (N/A) TRICUSPID VALVE REPAIR WITH EDWARDS MC3 TRICUSPID ANNULOPLASTY RING SIZE 30. (N/A) TRANSESOPHAGEAL ECHOCARDIOGRAM (TEE) (N/A)  Subjective: Feels pretty well.  Minimal pain.  Didn't sleep much.    Objective: Vital signs: BP Readings from Last 1 Encounters:  07/21/18 101/63   Pulse Readings from Last 1 Encounters:  07/21/18 91   Resp Readings from Last 1 Encounters:  07/21/18 16   Temp Readings from Last 1 Encounters:  07/21/18 98.2 F (36.8 C) (Oral)    Hemodynamics: CVP:  [20 mmHg-37 mmHg] 22 mmHg  Mixed venous co-ox 68.5%   Physical Exam:  Rhythm:   Sinus w/ ventricular bigeminy  Breath sounds: Diminished at bases  Heart sounds:  RRR  Incisions:  Dressings dry, intact  Abdomen:  Soft, non-distended, non-tender  Extremities:  Warm, well-perfused    Intake/Output from previous day: 08/25 0701 - 08/26 0700 In: 2877.3 [P.O.:1080; I.V.:1797.3] Out: 2975 [Urine:2935; Chest Tube:40] Intake/Output this shift: No intake/output data recorded.  Lab Results:  CBC: Recent Labs    07/20/18 0356 07/21/18 0435  WBC 21.7* 11.5*  HGB 11.0* 11.1*  HCT 34.6* 34.5*  PLT 122* 124*    BMET:  Recent Labs    07/20/18 0356 07/21/18 0435  NA 133* 132*  K 4.3 3.5  CL 100 95*  CO2 26 27  GLUCOSE 143* 136*  BUN 13 18  CREATININE 1.06 1.08  CALCIUM 8.1* 8.3*     PT/INR:   Recent Labs    07/21/18 0435  LABPROT 17.3*  INR 1.43    CBG (last 3)  Recent Labs    07/20/18 1957 07/20/18 2306 07/21/18 0303  GLUCAP 115* 105* 117*    ABG    Component Value Date/Time   PHART 7.369 07/19/2018 0718   PCO2ART 36.3 07/19/2018 0718   PO2ART 91.0 07/19/2018 0718   HCO3 20.9 07/19/2018 0718   TCO2 23  07/19/2018 1602   ACIDBASEDEF 4.0 (H) 07/19/2018 0718   O2SAT 68.5 07/21/2018 0441    CXR: PORTABLE CHEST 1 VIEW  COMPARISON:  07/20/2018; 07/19/2018; 07/15/2018; 02/27/2018; chest CT-06/13/2018  FINDINGS: Grossly unchanged cardiac silhouette and mediastinal contours post median sternotomy and valve replacement.  Interval removal of bibasilar chest tubes with development of small bilateral pneumothoraces including a loculated component within the left costophrenic angle.  Stable position of remaining support apparatus.  Grossly unchanged bibasilar heterogeneous/consolidative opacities, left greater than right. No pleural effusion. No acute osseus abnormalities.  IMPRESSION: Interval removal of bilateral chest tubes with development of small bilateral pneumothoraces. Further evaluation with dedicated PA and lateral chest radiograph could be performed as indicated.  These results will be called to the ordering clinician or representative by the Radiologist Assistant, and communication documented in the PACS or zVision Dashboard.   Electronically Signed   By: Sandi Mariscal M.D.   On: 07/21/2018 07:46  Assessment/Plan: S/P Procedure(s) (LRB): MITRAL VALVE (MV) REPLACEMENT WITH CARBOMEDICS OPTIFORM MITRAL VALVE SIZE 33. (N/A) MAZE (N/A) TRICUSPID VALVE REPAIR WITH EDWARDS MC3 TRICUSPID ANNULOPLASTY RING SIZE 30. (N/A) TRANSESOPHAGEAL ECHOCARDIOGRAM (TEE) (N/A)  Overall stable POD3 Was on DDD pacing overnight, currently sinus rhythm w/ ventricular bigeminy BP much improved w/  pacer off, currently on levophed and milrinone, co-ox 69% Breathing comfortably w/ O2 sats 94-97% UOP improved on lasix drip, weight 9 kg > preop   Wean milrinone and levophed off  Mobilize  Continue lasix drip  Start amiodarone  Continue Coumadin  Change CBGs to ac/hs  Keep in ICU until stable off drips   Rexene Alberts, MD 07/21/2018 7:56 AM

## 2018-07-21 NOTE — Progress Notes (Signed)
Patient ID: Jeffrey Larson, male   DOB: [birth year], 62 y.o.   MRN: [id number]  [facility] Evening Rounds:  Hemodynamically stable on levophed 7 mcg. Weaning as BP allows. Still on lasix drip 10 with urine output 150-200/hr.  Replacing K+. sats 98%

## 2018-07-22 ENCOUNTER — Encounter (HOSPITAL_COMMUNITY): Payer: Self-pay | Admitting: Thoracic Surgery (Cardiothoracic Vascular Surgery)

## 2018-07-22 LAB — BASIC METABOLIC PANEL
Anion gap: 9 (ref 5–15)
BUN: 23 mg/dL (ref 8–23)
CALCIUM: 8.5 mg/dL — AB (ref 8.9–10.3)
CHLORIDE: 93 mmol/L — AB (ref 98–111)
CO2: 31 mmol/L (ref 22–32)
CREATININE: 1.1 mg/dL (ref 0.61–1.24)
Glucose, Bld: 102 mg/dL — ABNORMAL HIGH (ref 70–99)
Potassium: 5.2 mmol/L — ABNORMAL HIGH (ref 3.5–5.1)
SODIUM: 133 mmol/L — AB (ref 135–145)

## 2018-07-22 LAB — GLUCOSE, CAPILLARY
GLUCOSE-CAPILLARY: 121 mg/dL — AB (ref 70–99)
GLUCOSE-CAPILLARY: 100 mg/dL — AB (ref 70–99)
GLUCOSE-CAPILLARY: 79 mg/dL (ref 70–99)
Glucose-Capillary: 103 mg/dL — ABNORMAL HIGH (ref 70–99)
Glucose-Capillary: 105 mg/dL — ABNORMAL HIGH (ref 70–99)

## 2018-07-22 LAB — PROTIME-INR
INR: 1.74
PROTHROMBIN TIME: 20.2 s — AB (ref 11.4–15.2)

## 2018-07-22 LAB — CBC
HCT: 33.8 % — ABNORMAL LOW (ref 39.0–52.0)
HEMOGLOBIN: 10.7 g/dL — AB (ref 13.0–17.0)
MCH: 28.2 pg (ref 26.0–34.0)
MCHC: 31.7 g/dL (ref 30.0–36.0)
MCV: 88.9 fL (ref 78.0–100.0)
PLATELETS: 147 10*3/uL — AB (ref 150–400)
RBC: 3.8 MIL/uL — ABNORMAL LOW (ref 4.22–5.81)
RDW: 16.5 % — AB (ref 11.5–15.5)
WBC: 10 10*3/uL (ref 4.0–10.5)

## 2018-07-22 MED ORDER — POTASSIUM CHLORIDE 10 MEQ/50ML IV SOLN
10.0000 meq | INTRAVENOUS | Status: AC
  Administered 2018-07-22 (×2): 10 meq via INTRAVENOUS
  Filled 2018-07-22 (×2): qty 50

## 2018-07-22 MED ORDER — AMIODARONE HCL 200 MG PO TABS
200.0000 mg | ORAL_TABLET | Freq: Two times a day (BID) | ORAL | Status: DC
  Administered 2018-07-23 – 2018-07-30 (×16): 200 mg via ORAL
  Filled 2018-07-22 (×17): qty 1

## 2018-07-22 MED FILL — Heparin Sodium (Porcine) Inj 1000 Unit/ML: INTRAMUSCULAR | Qty: 2500 | Status: AC

## 2018-07-22 MED FILL — Potassium Chloride Inj 2 mEq/ML: INTRAVENOUS | Qty: 40 | Status: AC

## 2018-07-22 MED FILL — Magnesium Sulfate Inj 50%: INTRAMUSCULAR | Qty: 10 | Status: AC

## 2018-07-22 MED FILL — Heparin Sodium (Porcine) Inj 1000 Unit/ML: INTRAMUSCULAR | Qty: 30 | Status: AC

## 2018-07-22 NOTE — Plan of Care (Signed)

## 2018-07-22 NOTE — Progress Notes (Signed)
T[facility] BRIEF SICU PROGRESS NOTE  4 Days Post-Op  S/P Procedure(s) (LRB): MITRAL VALVE (MV) REPLACEMENT WITH CARBOMEDICS OPTIFORM MITRAL VALVE SIZE 33. (N/A) MAZE (N/A) TRICUSPID VALVE REPAIR WITH EDWARDS MC3 TRICUSPID ANNULOPLASTY RING SIZE 30. (N/A) TRANSESOPHAGEAL ECHOCARDIOGRAM (TEE) (N/A)   Looks good and feels well.  Ambulated twice today NSR - sinus brady 50-60's, still on very low dose levophed for BP Breathing comfortably w/ O2 sats 92-94% on 1 L/min Excellent UOP > 250 mL/hr  Plan: Will decrease lasix dose.  Continue current plan  Rexene Alberts, MD 07/22/2018 8:27 PM

## 2018-07-22 NOTE — Care Management Note (Addendum)
Case Management Note  Patient Details  Name: Jeffrey Larson MRN: [id number] Date of Birth: 02/22/1956  Subjective/Objective:  61 yo admitted for MVR and tricuspid valve repair 8/23 with pulmonary HTN. PMH: CAD, MI, CHF, COPD, HLD, obesity. PTA patient lived at home with spouse, independent with ADLs, ambulating with no DME, and reported having a walker/cane (not in use).                 Action/Plan: CM met with patient to discuss transitional needs and PT recommendations for HHPT/ BSC and 24hr assistance. HH/DME choice list provided, with no preference. Patient indicated spouse would be available to provide 24hr assistance and transport home. Patient will need orders for HHPT/BSC and F2F once stable to transition home. HH referral given to Donna, [facility] liaison; DME referral given to James, [facility] DME liaison. AVS updated. CM will continue to follow.   Expected Discharge Date:                  Expected Discharge Plan:  Home/Self Care  In-House Referral:  NA  Discharge planning Services  CM Consult  Post Acute Care Choice:  Home Health, Durable Medical Equipment Choice offered to:  Patient  DME Arranged:  3-N-1 DME Agency:  [facility]  HH Arranged:  PT HH Agency:  [facility]  Status of Service:  In process, will continue to follow  If discussed at Long Length of Stay Meetings, dates discussed:    Additional Comments:  Natalie Gay RN, BSN, NCM-BC, ACM-RN [phone] 07/22/2018, 12:15 PM  

## 2018-07-22 NOTE — Progress Notes (Addendum)
TCTS DAILY ICU PROGRESS NOTE                   [address]            [place] [address]          [phone]   4 Days Post-Op Procedure(s) (LRB): MITRAL VALVE (MV) REPLACEMENT WITH CARBOMEDICS OPTIFORM MITRAL VALVE SIZE 33. (N/A) MAZE (N/A) TRICUSPID VALVE REPAIR WITH EDWARDS MC3 TRICUSPID ANNULOPLASTY RING SIZE 30. (N/A) TRANSESOPHAGEAL ECHOCARDIOGRAM (TEE) (N/A)  Total Length of Stay:  LOS: 4 days   Subjective: Feels okay this morning. He plans to walk in the halls later. He is having some incisional pain.   Objective: Vital signs in last 24 hours: Temp:  [98.1 F (36.7 C)-98.9 F (37.2 C)] 98.5 F (36.9 C) (08/27 0700) Pulse Rate:  [36-112] 56 (08/27 0800) Cardiac Rhythm: Sinus bradycardia (08/27 0800) Resp:  [13-24] 18 (08/27 0800) BP: (91-161)/(35-79) 115/50 (08/27 0800) SpO2:  [83 %-100 %] 97 % (08/27 0800) Arterial Line BP: (87-141)/(35-57) 101/42 (08/27 0800) Weight:  [142.9 kg] 142.9 kg (08/27 0500)  Filed Weights   07/20/18 0600 07/21/18 0500 07/22/18 0500  Weight: (!) 144.4 kg (!) 144.8 kg (!) 142.9 kg    Weight change: -1.9 kg   Hemodynamic parameters for last 24 hours:    Intake/Output from previous day: 08/26 0701 - 08/27 0700 In: 1940.9 [P.O.:720; I.V.:739.5; IV Piggyback:481.5] Out: 4075 [Urine:4075]  Intake/Output this shift: Total I/O In: 170 [P.O.:120; I.V.:50] Out: 400 [Urine:400]  Current Meds: Scheduled Meds: . acetaminophen  1,000 mg Oral Q6H  . amiodarone  400 mg Oral BID WC  . aspirin EC  81 mg Oral Daily  . atorvastatin  40 mg Oral QPM  . bisacodyl  10 mg Oral Daily   Or  . bisacodyl  10 mg Rectal Daily  . Chlorhexidine Gluconate Cloth  6 each Topical Daily  . docusate sodium  200 mg Oral Daily  . insulin aspart  0-24 Units Subcutaneous TID AC & HS  . insulin detemir  25 Units Subcutaneous Daily  . mouth rinse  15 mL Mouth Rinse q12n4p  . pantoprazole  40 mg Oral Daily  . sodium chloride flush  10-40 mL  Intracatheter Q12H  . sodium chloride flush  10-40 mL Intracatheter Q12H  . sodium chloride flush  3 mL Intravenous Q12H  . spironolactone  25 mg Oral Daily  . warfarin  4 mg Oral q1800  . Warfarin - Physician Dosing Inpatient   Does not apply q1800   Continuous Infusions: . sodium chloride    . furosemide (LASIX) infusion 10 mg/hr (07/22/18 0800)  . lactated ringers Stopped (07/20/18 1027)  . norepinephrine (LEVOPHED) Adult infusion 4 mcg/min (07/22/18 0800)   PRN Meds:.metoprolol tartrate, morphine injection, ondansetron (ZOFRAN) IV, oxyCODONE, sodium chloride flush, sodium chloride flush, sodium chloride flush, traMADol, zolpidem  General appearance: alert, cooperative and no distress Heart: regular rate and rhythm, S1, S2 normal, no murmur, click, rub or gallop Lungs: clear to auscultation bilaterally Abdomen: soft, non-tender; bowel sounds normal; no masses,  no organomegaly Extremities: 2-3+ non-pitting pedal and upper extremity edema Wound: clean and dry  Lab Results: CBC: Recent Labs    07/21/18 1716 07/22/18 0432  WBC 8.4 10.0  HGB 11.0* 10.7*  HCT 34.2* 33.8*  PLT 124* 147*   BMET:  Recent Labs    07/21/18 1716 07/22/18 0432  NA 132* 133*  K 3.7 5.2*  CL 93* 93*  CO2 29  31  GLUCOSE 96 102*  BUN 21 23  CREATININE 1.09 1.10  CALCIUM 8.5* 8.5*    CMET: Lab Results  Component Value Date   WBC 10.0 07/22/2018   HGB 10.7 (L) 07/22/2018   HCT 33.8 (L) 07/22/2018   PLT 147 (L) 07/22/2018   GLUCOSE 102 (H) 07/22/2018   ALT 31 07/15/2018   AST 26 07/15/2018   NA 133 (L) 07/22/2018   K 5.2 (H) 07/22/2018   CL 93 (L) 07/22/2018   CREATININE 1.10 07/22/2018   BUN 23 07/22/2018   CO2 31 07/22/2018   TSH 2.181 06/15/2012   INR 1.74 07/22/2018   HGBA1C 6.1 (H) 07/15/2018      PT/INR:  Recent Labs    07/22/18 0432  LABPROT 20.2*  INR 1.74   Radiology: Dg Chest Port 1 View  Result Date: 07/21/2018 CLINICAL DATA:  PICC line placement. EXAM:  PORTABLE CHEST 1 VIEW COMPARISON:  Chest radiograph July 21, 2018 at 1026 hours FINDINGS: Interval placement of RIGHT PICC with distal tip at cavoatrial junction. Interval removal of LEFT IJ sheath with similar in position LEFT internal jugular central venous catheter projecting distal inferior vena cava. Small LEFT greater than RIGHT apical pneumothoraces without mediastinal shift. Stable cardiomegaly. Status post median sternotomy with aortic valve replacement and clipping. Similar bronchitic changes and retrocardiac consolidation. Small pleural effusions. Similar bandlike density LEFT lung base, probable scarring. IMPRESSION: 1. New RIGHT PICC distal tip at cavoatrial junction. Similarly positioned LEFT IJ catheter. Interval removal of LEFT IJ sheath. 2. Stable small biapical pneumothoraces. 3. Stable cardiomegaly and retrocardiac consolidation. Similar bronchitic changes. Small pleural effusions. Electronically Signed   By: Elon Alas M.D.   On: 07/21/2018 19:21   Dg Chest Port 1 View  Result Date: 07/21/2018 CLINICAL DATA:  62 year old male with bilateral pneumothoraces and left IJ vascular catheters. Evaluate position of vascular catheters. EXAM: PORTABLE CHEST 1 VIEW COMPARISON:  Prior chest x-ray obtained earlier today at 6:08 a.m. FINDINGS: The left IJ Cordis sheath is kinked where it enters the vein. The left IJ central venous catheter terminates to the left of midline overlying the transverse aorta. This position is unchanged compared to prior. Stable cardiomegaly. Patient is status post median sternotomy with evidence of prior valve replacement and left atrial appendage ligation. Stable bilateral apical pneumothoraces approximately 10% on the right, and 15% on the left. Similar appearance of bilateral layering pleural effusions and bibasilar atelectasis. Mild pulmonary vascular congestion is present. No acute osseous abnormality. IMPRESSION: 1. Unchanged position of the left IJ central  venous catheter. The tip of the catheter remains left midline in overlies the aortic arch. While this may be within the left innominate vein, arterial placement cannot be excluded radiographically. If there is clinical concern, the catheter lumen could be connected to a pressure transducer and venous versus arterial waveforms could be evaluated. 2. Stable kink of the left IJ Cordis sheath likely at the site where the sheath enters the vein. 3. Stable bilateral apical pneumothoraces without significant interval progression. 4. Bilateral layering pleural effusions, bibasilar atelectasis and mild pulmonary edema. These results were called by telephone at the time of interpretation on 07/21/2018 at 10:52 am to the patient's nurse, who verbally acknowledged these results. Electronically Signed   By: Jacqulynn Cadet M.D.   On: 07/21/2018 10:52   [place] Ekg Site Rite  Result Date: 07/21/2018 If Site Rite image not attached, placement could not be confirmed due to current cardiac rhythm.    Assessment/Plan: S/P Procedure(s) (  LRB): MITRAL VALVE (MV) REPLACEMENT WITH CARBOMEDICS OPTIFORM MITRAL VALVE SIZE 33. (N/A) MAZE (N/A) TRICUSPID VALVE REPAIR WITH EDWARDS MC3 TRICUSPID ANNULOPLASTY RING SIZE 30. (N/A) TRANSESOPHAGEAL ECHOCARDIOGRAM (TEE) (N/A)  1. CV-NSR this morning. On Amio 400mg  BID, ASA, statin, and coumadin. BP well controlled but MAPs reading low at times on the a-line (may not be accurate at this point). Remains on some Levo. EPW remain and will keep since he is occasionally brady. 2. Pulm-tolerating 2L [place] with good oxygen saturation. Small and stable biapical pneumothoraces post PICC line insertion yesterday. Small bilateral pleural effusions  3. Renal-creatinine 1.10, on a Lasix drip at 10mg /hr. Hyperkalemia-holding supplementation. -2L yesterday. Weight is trending down.  4. Blood glucose well controlled on current regimen 5. H and H is stable, 10.7/33.8, expected acute blood loss anemia.  Platelets 147k 6. Continue low dose coumadin for valve thrombus prophylaxis, INR is 1.74  Plan: continue to wean drips as tolerated. Encourage incentive spirometer use and weaning oxygen as able. Continue lasix drip for fluid overload. Continue ambulation in the halls. Will remain in the unit for now.   Elgie Collard 07/22/2018 9:50 AM    I have seen and examined the patient and agree with the assessment and plan as outlined.  Still on low dose levophed for BP support.  Overall looks good otherwise  Rexene Alberts, MD 07/22/2018 12:40 PM

## 2018-07-23 ENCOUNTER — Encounter (HOSPITAL_COMMUNITY): Payer: Self-pay | Admitting: Thoracic Surgery (Cardiothoracic Vascular Surgery)

## 2018-07-23 ENCOUNTER — Other Ambulatory Visit: Payer: Self-pay

## 2018-07-23 ENCOUNTER — Inpatient Hospital Stay (HOSPITAL_COMMUNITY): Payer: BLUE CROSS/BLUE SHIELD

## 2018-07-23 DIAGNOSIS — J9311 Primary spontaneous pneumothorax: Secondary | ICD-10-CM

## 2018-07-23 LAB — BASIC METABOLIC PANEL
ANION GAP: 9 (ref 5–15)
BUN: 26 mg/dL — ABNORMAL HIGH (ref 8–23)
CALCIUM: 8.2 mg/dL — AB (ref 8.9–10.3)
CO2: 37 mmol/L — ABNORMAL HIGH (ref 22–32)
Chloride: 85 mmol/L — ABNORMAL LOW (ref 98–111)
Creatinine, Ser: 1.17 mg/dL (ref 0.61–1.24)
Glucose, Bld: 102 mg/dL — ABNORMAL HIGH (ref 70–99)
POTASSIUM: 2.9 mmol/L — AB (ref 3.5–5.1)
SODIUM: 131 mmol/L — AB (ref 135–145)

## 2018-07-23 LAB — GLUCOSE, CAPILLARY
GLUCOSE-CAPILLARY: 116 mg/dL — AB (ref 70–99)
GLUCOSE-CAPILLARY: 79 mg/dL (ref 70–99)
GLUCOSE-CAPILLARY: 75 mg/dL (ref 70–99)
Glucose-Capillary: 90 mg/dL (ref 70–99)

## 2018-07-23 LAB — CBC
HCT: 30.9 % — ABNORMAL LOW (ref 39.0–52.0)
Hemoglobin: 10.1 g/dL — ABNORMAL LOW (ref 13.0–17.0)
MCH: 28.5 pg (ref 26.0–34.0)
MCHC: 32.7 g/dL (ref 30.0–36.0)
MCV: 87 fL (ref 78.0–100.0)
PLATELETS: 197 10*3/uL (ref 150–400)
RBC: 3.55 MIL/uL — AB (ref 4.22–5.81)
RDW: 16.2 % — AB (ref 11.5–15.5)
WBC: 10.1 10*3/uL (ref 4.0–10.5)

## 2018-07-23 LAB — PROTIME-INR
INR: 2.37
Prothrombin Time: 25.7 seconds — ABNORMAL HIGH (ref 11.4–15.2)

## 2018-07-23 MED ORDER — FUROSEMIDE 10 MG/ML IJ SOLN
10.0000 mg/h | INTRAVENOUS | Status: DC
  Administered 2018-07-23: 4 mg/h via INTRAVENOUS
  Administered 2018-07-25 – 2018-07-29 (×5): 10 mg/h via INTRAVENOUS
  Filled 2018-07-23 (×8): qty 25
  Filled 2018-07-23: qty 21
  Filled 2018-07-23 (×3): qty 25

## 2018-07-23 MED ORDER — POTASSIUM CHLORIDE 10 MEQ/50ML IV SOLN
10.0000 meq | INTRAVENOUS | Status: AC | PRN
  Administered 2018-07-23 (×3): 10 meq via INTRAVENOUS
  Filled 2018-07-23 (×5): qty 50

## 2018-07-23 MED ORDER — WARFARIN SODIUM 2 MG PO TABS: 2.0000 mg | ORAL_TABLET | Freq: Every evening | ORAL | Status: DC

## 2018-07-23 MED ORDER — WARFARIN SODIUM 2 MG PO TABS: 4.0000 mg | ORAL_TABLET | Freq: Every evening | ORAL | Status: DC

## 2018-07-23 MED ORDER — POTASSIUM CHLORIDE 10 MEQ/50ML IV SOLN
10.0000 meq | INTRAVENOUS | Status: AC
  Administered 2018-07-23 (×4): 10 meq via INTRAVENOUS
  Filled 2018-07-23 (×3): qty 50

## 2018-07-23 MED ORDER — ALBUTEROL SULFATE (2.5 MG/3ML) 0.083% IN NEBU
INHALATION_SOLUTION | RESPIRATORY_TRACT | Status: AC
  Administered 2018-07-23: 2.5 mg
  Filled 2018-07-23: qty 3

## 2018-07-23 MED ORDER — POTASSIUM CHLORIDE CRYS ER 20 MEQ PO TBCR
40.0000 meq | EXTENDED_RELEASE_TABLET | Freq: Two times a day (BID) | ORAL | Status: DC
  Administered 2018-07-23 – 2018-07-26 (×7): 40 meq via ORAL
  Filled 2018-07-23 (×7): qty 2

## 2018-07-23 MED ORDER — LIDOCAINE HCL (PF) 1 % IJ SOLN
INTRAMUSCULAR | Status: AC
  Filled 2018-07-23: qty 30

## 2018-07-23 MED ORDER — IPRATROPIUM-ALBUTEROL 0.5-2.5 (3) MG/3ML IN SOLN
3.0000 mL | RESPIRATORY_TRACT | Status: DC
  Administered 2018-07-23 – 2018-07-25 (×11): 3 mL via RESPIRATORY_TRACT
  Filled 2018-07-23 (×12): qty 3

## 2018-07-23 NOTE — Op Note (Signed)
CARDIOTHORACIC SURGERY OPERATIVE NOTE  Date of Procedure:  07/23/2018  Preoperative Diagnosis: Left Pneumothorax  Postoperative Diagnosis: Same  Procedure:   Left chest tube placement  Surgeon:   Valentina Gu. Roxy Manns, MD  Anesthesia: 1% lidocaine local     DETAILS OF THE OPERATIVE PROCEDURE  Following full informed consent the patient was monitored for rhythm, BP and oxygen saturation. The left anterior chest was prepared and draped in a sterile manner. 1% lidocaine was utilized to anesthetize the skin and subcutaneous tissues. A small incision was made and a 16 French Cook chest tube was placed through the incision into the pleural space using the Seldinger technique. The tube was secured to the skin and connected to a closed suction collection device. The patient tolerated the procedure well. A portable CXR was ordered. There were no complications.    Valentina Gu. Roxy Manns, MD 07/23/2018 2:35 PM

## 2018-07-23 NOTE — Progress Notes (Signed)
Physical Therapy Treatment Patient Details Name: Jeffrey Larson MRN: [id number] DOB: [birth year] Today's Date: 07/23/2018    History of Present Illness 62 yo admitted for MVR and tricuspid valve repair 8/23 with pulmonary HTN. PMhx: CAD, MI, CHF, COPD, HLD, obesity    PT Comments    Pt pleasant in chair on arrival, reported had already walked the wall with nurses earlier this morning. Pt vitals taken in sitting recliner prior to session: HR 58, SpO2 96 on 4L [place], RR 17, BP 97/71 (82). Pt progressing with activity tolerance, able to ambulate 370 ft. Pt with productive coughing fit during ambulation, with SpO2 decreasing to 85 during. Pt cued for pursed lip breathing with SpO2 returning to 90-92 on 4Lnc through rest of session. Pt vitals taken in recliner after session: HR 73, SpO2 91%, RR 23, BP 118/102 (110). Pt has RW at home and reports does not use it. Pt encouraged to use RW while recovering at home for increased balance and energy conservation. Pt reported having trouble taking deep breaths, but not using spirometer. Pt educated on risks of pneumonia and encouraged to use 10x every hour here and at home. Pt sternal precautions reviewed.      Follow Up Recommendations  Home health PT;Supervision/Assistance - 24 hour     Equipment Recommendations  3in1 (PT)    Recommendations for Other Services       Precautions / Restrictions Precautions Precautions: Sternal;Fall Precaution Comments: sternal precautions reviewed, able to recite no pushing and keep arms in, needs further reinforcement  Restrictions Weight Bearing Restrictions: Yes(sternal )    Mobility  Bed Mobility               General bed mobility comments: in chair on arrival  Transfers Overall transfer level: Needs assistance Equipment used: Rolling walker (2 wheeled) Transfers: Sit to/from Stand Sit to Stand: Min guard         General transfer comment: cues for hand placement on knees to adhere to  precautions, pt performs well with count to three rocking to build momentum prior to standing.   Ambulation/Gait Ambulation/Gait assistance: Min guard Gait Distance (Feet): 370 Feet Assistive device: Rolling walker (2 wheeled) Gait Pattern/deviations: Step-through pattern;Decreased stride length   Gait velocity interpretation: >2.62 ft/sec, indicative of community ambulatory General Gait Details: pt cued for pursed lip breathing when SOB. Encouraged to take standing rest breaks for breathing and energy conservation. Encouraged to continue walking with nursing to increased endurance.    Stairs             Wheelchair Mobility    Modified Rankin (Stroke Patients Only)       Balance Overall balance assessment: Needs assistance Sitting-balance support: Feet supported Sitting balance-Leahy Scale: Good Sitting balance - Comments: cues to keep elbows in at sides to adhere to sternal precautions.      Standing balance-Leahy Scale: Fair Standing balance comment: pt able to release RW, grab pillow for cough                             Cognition Arousal/Alertness: Awake/alert Behavior During Therapy: WFL for tasks assessed/performed Overall Cognitive Status: Within Functional Limits for tasks assessed                                        Exercises      General Comments  General comments (skin integrity, edema, etc.): pt c/o with "sore bottom", sternal incision with dressing intact      Pertinent Vitals/Pain Pain Assessment: Faces Faces Pain Scale: Hurts little more Pain Location: incision site, increased pain w/ coughing  Pain Descriptors / Indicators: Guarding;Grimacing;Discomfort;Sore;Aching Pain Intervention(s): Limited activity within patient's tolerance;Monitored during session;Repositioned(encouraged use of pillow )    Home Living                      Prior Function            PT Goals (current goals can now be found in  the care plan section) Progress towards PT goals: Progressing toward goals    Frequency    Min 3X/week      PT Plan Current plan remains appropriate    Co-evaluation              AM-PAC PT "6 Clicks" Daily Activity  Outcome Measure  Difficulty turning over in bed (including adjusting bedclothes, sheets and blankets)?: A Lot Difficulty moving from lying on back to sitting on the side of the bed? : Unable Difficulty sitting down on and standing up from a chair with arms (e.g., wheelchair, bedside commode, etc,.)?: A Little Help needed moving to and from a bed to chair (including a wheelchair)?: A Little Help needed walking in hospital room?: A Little Help needed climbing 3-5 steps with a railing? : A Lot 6 Click Score: 14    End of Session Equipment Utilized During Treatment: Gait belt;Oxygen Activity Tolerance: Patient tolerated treatment well Patient left: in chair;with call bell/phone within reach;with nursing/sitter in room Nurse Communication: Mobility status;Precautions PT Visit Diagnosis: Other abnormalities of gait and mobility (R26.89);Muscle weakness (generalized) (M62.81)     Time: 2263-3354 PT Time Calculation (min) (ACUTE ONLY): 25 min  Charges:  $Gait Training: 23-37 mins                     Samuella Bruin, [place]  Acute Rehab [phone]    Samuella Bruin 07/23/2018, 10:17 AM

## 2018-07-23 NOTE — Code Documentation (Signed)
Called for code blue.  When arrived at room, patient sitting up, breathing.  Was dismissed by attending.  [place] Constable, D.O.  PGY-1 Family Medicine  07/23/2018 2:48 PM

## 2018-07-23 NOTE — Progress Notes (Signed)
TCTS BRIEF SICU PROGRESS NOTE  5 Days Post-Op  S/P Procedure(s) (LRB): MITRAL VALVE (MV) REPLACEMENT WITH CARBOMEDICS OPTIFORM MITRAL VALVE SIZE 33. (N/A) MAZE (N/A) TRICUSPID VALVE REPAIR WITH EDWARDS MC3 TRICUSPID ANNULOPLASTY RING SIZE 30. (N/A) TRANSESOPHAGEAL ECHOCARDIOGRAM (TEE) (N/A)   Approximately 5-10 minutes after chest tube placement patient tried to sit up on side of the bed and passed out He became apneic and unresponsive w/out change in heart rhythm He was laid back down in bed and woke up within 1-2 minutes He is now awake and alert.  He reports some shortness of breath  Plan: Monitor closely.  Check STAT pCXR  Rexene Alberts, MD 07/23/2018 2:47 PM

## 2018-07-23 NOTE — Progress Notes (Signed)
[address]       [place], [address]             [phone]        CARDIOTHORACIC SURGERY PROGRESS NOTE   R5 Days Post-Op Procedure(s) (LRB): MITRAL VALVE (MV) REPLACEMENT WITH CARBOMEDICS OPTIFORM MITRAL VALVE SIZE 33. (N/A) MAZE (N/A) TRICUSPID VALVE REPAIR WITH EDWARDS MC3 TRICUSPID ANNULOPLASTY RING SIZE 30. (N/A) TRANSESOPHAGEAL ECHOCARDIOGRAM (TEE) (N/A)  Subjective: No complaints.  Minimal pain. Some dyspnea with activity  Objective: Vital signs: BP Readings from Last 1 Encounters:  07/23/18 100/61   Pulse Readings from Last 1 Encounters:  07/23/18 (!) 58   Resp Readings from Last 1 Encounters:  07/23/18 16   Temp Readings from Last 1 Encounters:  07/23/18 98.4 F (36.9 C) (Oral)    Hemodynamics: CVP:  [3 mmHg] 3 mmHg  Physical Exam:  Rhythm:   Sinus vs junctional w/ HR 55-60  Breath sounds: Diminished on left  Heart sounds:  RRR  Incisions:  Clean and dry  Abdomen:  Soft, non-distended, non-tender  Extremities:  Warm, well-perfused, swollen   Intake/Output from previous day: 08/27 0701 - 08/28 0700 In: 875.7 [P.O.:320; I.V.:555.7] Out: 4950 [Urine:4950] Intake/Output this shift: Total I/O In: 867.4 [P.O.:600; I.V.:18.4; IV Piggyback:249] Out: 400 [Urine:400]  Lab Results:  CBC: Recent Labs    07/22/18 0432 07/23/18 0410  WBC 10.0 10.1  HGB 10.7* 10.1*  HCT 33.8* 30.9*  PLT 147* 197    BMET:  Recent Labs    07/22/18 0432 07/23/18 0410  NA 133* 131*  K 5.2* 2.9*  CL 93* 85*  CO2 31 37*  GLUCOSE 102* 102*  BUN 23 26*  CREATININE 1.10 1.17  CALCIUM 8.5* 8.2*     PT/INR:   Recent Labs    07/23/18 0410  LABPROT 25.7*  INR 2.37    CBG (last 3)  Recent Labs    07/22/18 2113 07/23/18 0752 07/23/18 1236  GLUCAP 79 116* 79    ABG    Component Value Date/Time   PHART 7.369 07/19/2018 0718   PCO2ART 36.3 07/19/2018 0718   PO2ART 91.0 07/19/2018 0718   HCO3 20.9 07/19/2018 0718   TCO2 27  07/21/2018 1714   ACIDBASEDEF 4.0 (H) 07/19/2018 0718   O2SAT 68.5 07/21/2018 0441    CXR: PORTABLE CHEST 1 VIEW  COMPARISON:  07/21/2018  FINDINGS: Sequelae of cardiac valve replacement and left atrial appendage clipping are again identified. A right PICC terminates in the region of the cavoatrial junction. A left jugular catheter has been removed. A small right apical pneumothorax has slightly enlarged. A large left pneumothorax has greatly enlarged with resultant left lung collapse. There is no significant shift of the mediastinal structures. Mild bronchitic changes are again noted.  IMPRESSION: 1. Large left pneumothorax, greatly increased in size from the prior study with left lung collapse. 2. Slight enlargement of small right apical pneumothorax.  Critical Value/emergent results were called by telephone at the time of interpretation on 07/23/2018 at 11:59 am to Dr. Darylene Price , who verbally acknowledged these results.   Electronically Signed   By: Logan Bores M.D.   On: 07/23/2018 12:01  Assessment/Plan: S/P Procedure(s) (LRB): MITRAL VALVE (MV) REPLACEMENT WITH CARBOMEDICS OPTIFORM MITRAL VALVE SIZE 33. (N/A) MAZE (N/A) TRICUSPID VALVE REPAIR WITH EDWARDS MC3 TRICUSPID ANNULOPLASTY RING SIZE 30. (N/A) TRANSESOPHAGEAL ECHOCARDIOGRAM (TEE) (N/A)  New large left pneumothorax Otherwise clinically stable Maintaining NSR vs junctional rhythm, BP stable w/ levophed off  Some shortness of breath, but breathing comfortably w/ O2 sats 92-97% on 2 L/min Acute on chronic combined systolic and diastolic CHF with right-sided CHF with expected post-op volume excess, diuresing well on lasix drip but weight still 4 kg > preop Severe pulmonary hypertension Expected post op acute blood loss anemia, mild, stable Expected post op atelectasis, worse w/ PTX Morbid obesity OSA   Needs chest tube  Hold lasix for now  Hold Coumadin today  Decrease amiodarone  dose    Rexene Alberts, MD 07/23/2018 2:14 PM

## 2018-07-23 NOTE — Progress Notes (Signed)
Patient ID: HARVE SPRADLEY, male   DOB: Jun 08, 1956, 62 y.o.   MRN: [id number] EVENING ROUNDS NOTE :     [address]       [place] [address]             [phone]                 5 Days Post-Op Procedure(s) (LRB): MITRAL VALVE (MV) REPLACEMENT WITH CARBOMEDICS OPTIFORM MITRAL VALVE SIZE 33. (N/A) MAZE (N/A) TRICUSPID VALVE REPAIR WITH EDWARDS MC3 TRICUSPID ANNULOPLASTY RING SIZE 30. (N/A) TRANSESOPHAGEAL ECHOCARDIOGRAM (TEE) (N/A)  Total Length of Stay:  LOS: 5 days  BP (!) 100/48   Pulse (!) 52   Temp 97.6 F (36.4 C) (Oral)   Resp 13   Wt (!) 140 kg   SpO2 96%   BMI 43.05 kg/m   .Intake/Output      08/28 0701 - 08/29 0700   P.O. 600   I.V. (mL/kg) 33.9 (0.2)   IV Piggyback 407.8   Total Intake(mL/kg) 1041.7 (7.4)   Urine (mL/kg/hr) 400 (0.2)   Chest Tube 250   Total Output 650   Net +391.7         . sodium chloride    . furosemide (LASIX) infusion 4 mg/hr (07/23/18 1507)  . lactated ringers Stopped (07/20/18 1027)     Lab Results  Component Value Date   WBC 10.1 07/23/2018   HGB 10.1 (L) 07/23/2018   HCT 30.9 (L) 07/23/2018   PLT 197 07/23/2018   GLUCOSE 102 (H) 07/23/2018   ALT 31 07/15/2018   AST 26 07/15/2018   NA 131 (L) 07/23/2018   K 2.9 (L) 07/23/2018   CL 85 (L) 07/23/2018   CREATININE 1.17 07/23/2018   BUN 26 (H) 07/23/2018   CO2 37 (H) 07/23/2018   TSH 2.181 06/15/2012   INR 2.37 07/23/2018   HGBA1C 6.1 (H) 07/15/2018   Stab le now with chest tube in  k replaced today Cr stab le On lasix drip  Grace Isaac MD  Beeper [phone] Office [phone] 07/23/2018 7:22 PM

## 2018-07-24 ENCOUNTER — Inpatient Hospital Stay (HOSPITAL_COMMUNITY): Payer: BLUE CROSS/BLUE SHIELD

## 2018-07-24 LAB — GLUCOSE, CAPILLARY
GLUCOSE-CAPILLARY: 99 mg/dL (ref 70–99)
GLUCOSE-CAPILLARY: 147 mg/dL — AB (ref 70–99)
Glucose-Capillary: 112 mg/dL — ABNORMAL HIGH (ref 70–99)
Glucose-Capillary: 87 mg/dL (ref 70–99)

## 2018-07-24 LAB — PROTIME-INR
INR: 2.25
PROTHROMBIN TIME: 24.7 s — AB (ref 11.4–15.2)

## 2018-07-24 LAB — COMPREHENSIVE METABOLIC PANEL
ALBUMIN: 2.3 g/dL — AB (ref 3.5–5.0)
ALT: 22 U/L (ref 0–44)
AST: 29 U/L (ref 15–41)
Alkaline Phosphatase: 96 U/L (ref 38–126)
Anion gap: 10 (ref 5–15)
BUN: 31 mg/dL — AB (ref 8–23)
CHLORIDE: 86 mmol/L — AB (ref 98–111)
CO2: 35 mmol/L — AB (ref 22–32)
CREATININE: 1.19 mg/dL (ref 0.61–1.24)
Calcium: 8.1 mg/dL — ABNORMAL LOW (ref 8.9–10.3)
GFR calc Af Amer: >60 mL/min (ref >60)
GFR calc non Af Amer: >60 mL/min (ref >60)
GLUCOSE: 130 mg/dL — AB (ref 70–99)
POTASSIUM: 3.5 mmol/L (ref 3.5–5.1)
SODIUM: 131 mmol/L — AB (ref 135–145)
Total Bilirubin: 0.9 mg/dL (ref 0.3–1.2)
Total Protein: 5.6 g/dL — ABNORMAL LOW (ref 6.5–8.1)

## 2018-07-24 MED ORDER — VANCOMYCIN HCL 10 G IV SOLR
1250.0000 mg | Freq: Two times a day (BID) | INTRAVENOUS | Status: DC
  Administered 2018-07-24 – 2018-07-27 (×6): 1250 mg via INTRAVENOUS
  Filled 2018-07-24 (×6): qty 1250

## 2018-07-24 MED ORDER — VANCOMYCIN HCL 10 G IV SOLR
2000.0000 mg | Freq: Once | INTRAVENOUS | Status: AC
  Administered 2018-07-24: 2000 mg via INTRAVENOUS
  Filled 2018-07-24: qty 2000

## 2018-07-24 MED ORDER — WARFARIN SODIUM 2.5 MG PO TABS
2.5000 mg | ORAL_TABLET | Freq: Every day | ORAL | Status: DC
  Administered 2018-07-24: 2.5 mg via ORAL
  Filled 2018-07-24: qty 1

## 2018-07-24 MED ORDER — POTASSIUM CHLORIDE 10 MEQ/50ML IV SOLN
10.0000 meq | INTRAVENOUS | Status: AC
  Administered 2018-07-24 – 2018-07-25 (×7): 10 meq via INTRAVENOUS
  Filled 2018-07-24: qty 50

## 2018-07-24 MED ORDER — SODIUM CHLORIDE 0.9 % IV SOLN
2.0000 g | Freq: Three times a day (TID) | INTRAVENOUS | Status: DC
  Administered 2018-07-24 – 2018-07-26 (×7): 2 g via INTRAVENOUS
  Filled 2018-07-24 (×8): qty 2

## 2018-07-24 NOTE — Progress Notes (Signed)
[address]       [place] [address]             [phone]        CARDIOTHORACIC SURGERY PROGRESS NOTE   R6 Days Post-Op Procedure(s) (LRB): MITRAL VALVE (MV) REPLACEMENT WITH CARBOMEDICS OPTIFORM MITRAL VALVE SIZE 33. (N/A) MAZE (N/A) TRICUSPID VALVE REPAIR WITH EDWARDS MC3 TRICUSPID ANNULOPLASTY RING SIZE 30. (N/A) TRANSESOPHAGEAL ECHOCARDIOGRAM (TEE) (N/A)  Subjective: Feels much better.  Reports breathing improved.  Objective: Vital signs: BP Readings from Last 1 Encounters:  07/24/18 114/68   Pulse Readings from Last 1 Encounters:  07/24/18 64   Resp Readings from Last 1 Encounters:  07/24/18 16   Temp Readings from Last 1 Encounters:  07/24/18 98.2 F (36.8 C) (Oral)    Hemodynamics:    Physical Exam:  Rhythm:   Afib w/ controlled rate  Breath sounds: clear  Heart sounds:  RRR w/out murmur  Incisions:  Clean and dry  Abdomen:  Soft, non-distended, non-tender  Extremities:  Warm, well-perfused, swollen  Chest tubes:  trivial volume thin serosanguinous output, no air leak     Intake/Output from previous day: 08/28 0701 - 08/29 0700 In: 1834.1 [P.O.:1320; I.V.:106.3; IV Piggyback:407.8] Out: 2295 [Urine:1995; Chest Tube:300] Intake/Output this shift: No intake/output data recorded.  Lab Results:  CBC: Recent Labs    07/22/18 0432 07/23/18 0410  WBC 10.0 10.1  HGB 10.7* 10.1*  HCT 33.8* 30.9*  PLT 147* 197    BMET:  Recent Labs    07/23/18 0410 07/24/18 0435  NA 131* 131*  K 2.9* 3.5  CL 85* 86*  CO2 37* 35*  GLUCOSE 102* 130*  BUN 26* 31*  CREATININE 1.17 1.19  CALCIUM 8.2* 8.1*     PT/INR:   Recent Labs    07/24/18 0435  LABPROT 24.7*  INR 2.25    CBG (last 3)  Recent Labs    07/23/18 1236 07/23/18 1552 07/23/18 2141  GLUCAP 79 90 75    ABG    Component Value Date/Time   PHART 7.369 07/19/2018 0718   PCO2ART 36.3 07/19/2018 0718   PO2ART 91.0 07/19/2018 0718   HCO3 20.9 07/19/2018  0718   TCO2 27 07/21/2018 1714   ACIDBASEDEF 4.0 (H) 07/19/2018 0718   O2SAT 68.5 07/21/2018 0441    CXR: Much improved but severe opacity of left lung remains, most c/w reexpansion pulmonary edema / ALI but cannot r/o HCAP  Assessment/Plan: S/P Procedure(s) (LRB): MITRAL VALVE (MV) REPLACEMENT WITH CARBOMEDICS OPTIFORM MITRAL VALVE SIZE 33. (N/A) MAZE (N/A) TRICUSPID VALVE REPAIR WITH EDWARDS MC3 TRICUSPID ANNULOPLASTY RING SIZE 30. (N/A) TRANSESOPHAGEAL ECHOCARDIOGRAM (TEE) (N/A)  Clinically stable, slowly improving Rate-controlled Afib w/ stable BP, anticoagulated on warfarin Breathing comfortably w/ O2 sats  93-97% on 3 L/min via [place] Bilateral pneumothorax after chest tube removal w/ delayed complete collapse of left lung yesterday, s/p chest tube placement - no air leak and left lung reexpanded Reexpansion pulmonary edema vs HCAP left lung Acute on chronic combined systolic and diastolic CHF with right-sided CHF with expected post-op volume excess, diuresing on lasix drip but weight still 4 kg > preop Hypokalemia, induced by loop diuretics Severe pulmonary hypertension Expected post op acute blood loss anemia, mild, stable Morbid obesity OSA   Start empiric Vanc + Maxepime for possible HCAP but will plan only 3 days if clinical course not c/w infection  Increase lasix drip  Leave chest tube in place for now  Restart Coumadin  at reduced dose  Supplement potassium  Mobilize  Rexene Alberts, MD 07/24/2018 7:56 AM

## 2018-07-24 NOTE — Progress Notes (Signed)
CT surgery p.m. Rounds  Breathing comfortably on nasal cannula Improved urine output on Lasix drip Heart rate currently slow A. Fib Continue current care

## 2018-07-24 NOTE — Progress Notes (Signed)
Pharmacy Antibiotic Note  Jeffrey Larson is a 62 y.o. male admitted on 07/18/2018 with valve repair/replacement surgery. Hospital course has been complicated by possible HCAP. Pharmacy has been consulted for vancomycin and cefepime dosing.  Plan: Vancomycin 2,000 mg x 1  Vancomycin 1,250 mg IV every 12 hours.  Goal trough 15-20 mcg/mL.  Cefepime 2 g every 8 hours  Weight: (!) 308 lb 13.8 oz (140.1 kg)  Temp (24hrs), Avg:98.1 F (36.7 C), Min:97.2 F (36.2 C), Max:98.6 F (37 C)  Recent Labs  Lab 07/20/18 0356 07/21/18 0435 07/21/18 1714 07/21/18 1716 07/22/18 0432 07/23/18 0410 07/24/18 0435  WBC 21.7* 11.5*  --  8.4 10.0 10.1  --   CREATININE 1.06 1.08 1.20 1.09 1.10 1.17 1.19    Estimated Creatinine Clearance: 93.3 mL/min (by C-G formula based on SCr of 1.19 mg/dL).    Allergies  Allergen Reactions  . Ramipril Cough    cough    Antimicrobials this admission: Vancomycin 8/29 >>  Cefepime 8/29 >>   Perioperative Antibiotics: vancomycin 8/23>>8/24, cefuroxime 8/23>>8/25  Dose adjustments this admission: none  Microbiology results: none  Thank you for allowing pharmacy to be a part of this patient's care.  Vertis Kelch, PharmD PGY1 Pharmacy Resident Phone [phone] 07/24/2018       9:19 AM

## 2018-07-25 ENCOUNTER — Inpatient Hospital Stay (HOSPITAL_COMMUNITY): Payer: BLUE CROSS/BLUE SHIELD

## 2018-07-25 LAB — BASIC METABOLIC PANEL
ANION GAP: 9 (ref 5–15)
BUN: 25 mg/dL — AB (ref 8–23)
CO2: 36 mmol/L — ABNORMAL HIGH (ref 22–32)
Calcium: 8.1 mg/dL — ABNORMAL LOW (ref 8.9–10.3)
Chloride: 85 mmol/L — ABNORMAL LOW (ref 98–111)
Creatinine, Ser: 1.04 mg/dL (ref 0.61–1.24)
GFR calc Af Amer: >60 mL/min (ref >60)
GFR calc non Af Amer: >60 mL/min (ref >60)
GLUCOSE: 145 mg/dL — AB (ref 70–99)
POTASSIUM: 3.4 mmol/L — AB (ref 3.5–5.1)
SODIUM: 130 mmol/L — AB (ref 135–145)

## 2018-07-25 LAB — CBC
HCT: 32.4 % — ABNORMAL LOW (ref 39.0–52.0)
Hemoglobin: 10.4 g/dL — ABNORMAL LOW (ref 13.0–17.0)
MCH: 28.3 pg (ref 26.0–34.0)
MCHC: 32.1 g/dL (ref 30.0–36.0)
MCV: 88.3 fL (ref 78.0–100.0)
Platelets: 291 10*3/uL (ref 150–400)
RBC: 3.67 MIL/uL — AB (ref 4.22–5.81)
RDW: 16.8 % — ABNORMAL HIGH (ref 11.5–15.5)
WBC: 11.9 10*3/uL — AB (ref 4.0–10.5)

## 2018-07-25 LAB — GLUCOSE, CAPILLARY
Glucose-Capillary: 102 mg/dL — ABNORMAL HIGH (ref 70–99)
Glucose-Capillary: 101 mg/dL — ABNORMAL HIGH (ref 70–99)
Glucose-Capillary: 114 mg/dL — ABNORMAL HIGH (ref 70–99)

## 2018-07-25 LAB — PROTIME-INR
INR: 1.73
PROTHROMBIN TIME: 20.1 s — AB (ref 11.4–15.2)

## 2018-07-25 MED ORDER — IPRATROPIUM-ALBUTEROL 0.5-2.5 (3) MG/3ML IN SOLN: 3.0000 mL | Freq: Four times a day (QID) | RESPIRATORY_TRACT | Status: DC | PRN

## 2018-07-25 MED ORDER — POTASSIUM CHLORIDE 10 MEQ/50ML IV SOLN
INTRAVENOUS | Status: AC
  Administered 2018-07-25: 10 meq via INTRAVENOUS
  Filled 2018-07-25: qty 50

## 2018-07-25 MED ORDER — POTASSIUM CHLORIDE 10 MEQ/50ML IV SOLN
10.0000 meq | INTRAVENOUS | Status: AC
  Administered 2018-07-25 (×3): 10 meq via INTRAVENOUS
  Filled 2018-07-25 (×2): qty 50

## 2018-07-25 MED ORDER — WARFARIN SODIUM 4 MG PO TABS
4.0000 mg | ORAL_TABLET | Freq: Every evening | ORAL | Status: DC
  Administered 2018-07-25 – 2018-07-28 (×4): 4 mg via ORAL
  Filled 2018-07-25 (×3): qty 1
  Filled 2018-07-25: qty 2

## 2018-07-25 MED ORDER — POTASSIUM CHLORIDE 10 MEQ/50ML IV SOLN
10.0000 meq | INTRAVENOUS | Status: AC
  Administered 2018-07-25 (×4): 10 meq via INTRAVENOUS
  Filled 2018-07-25 (×5): qty 50

## 2018-07-25 NOTE — Plan of Care (Signed)

## 2018-07-25 NOTE — Progress Notes (Signed)
[facility] BRIEF SICU PROGRESS NOTE  7 Days Post-Op  S/P Procedure(s) (LRB): MITRAL VALVE (MV) REPLACEMENT WITH CARBOMEDICS OPTIFORM MITRAL VALVE SIZE 33. (N/A) MAZE (N/A) TRICUSPID VALVE REPAIR WITH EDWARDS MC3 TRICUSPID ANNULOPLASTY RING SIZE 30. (N/A) TRANSESOPHAGEAL ECHOCARDIOGRAM (TEE) (N/A)   Stable day Awaiting bed on 4E for transfer  Plan: Continue current plan  Rexene Alberts, MD 07/25/2018 7:34 PM

## 2018-07-25 NOTE — Progress Notes (Signed)
[address]       [place] [address]             [phone]        CARDIOTHORACIC SURGERY PROGRESS NOTE   R7 Days Post-Op Procedure(s) (LRB): MITRAL VALVE (MV) REPLACEMENT WITH CARBOMEDICS OPTIFORM MITRAL VALVE SIZE 33. (N/A) MAZE (N/A) TRICUSPID VALVE REPAIR WITH EDWARDS MC3 TRICUSPID ANNULOPLASTY RING SIZE 30. (N/A) TRANSESOPHAGEAL ECHOCARDIOGRAM (TEE) (N/A)  Subjective: No complaints.  Denies pain, SOB.  Feels stronger. Appetite improving.  Just ambulated around ICU  Objective: Vital signs: BP Readings from Last 1 Encounters:  07/25/18 109/65   Pulse Readings from Last 1 Encounters:  07/25/18 71   Resp Readings from Last 1 Encounters:  07/25/18 20   Temp Readings from Last 1 Encounters:  07/25/18 98.4 F (36.9 C) (Oral)    Hemodynamics:    Physical Exam:  Rhythm:   Afib w/ controlled rate  Breath sounds: clear  Heart sounds:  irregular  Incisions:  Clean and dry  Abdomen:  Soft, non-distended, non-tender  Extremities:  Warm, well-perfused  Chest tubes:  trivial volume thin serosanguinous output, no air leak     Intake/Output from previous day: 08/29 0701 - 08/30 0700 In: 2579.5 [P.O.:1330; I.V.:250; IV Piggyback:999.5] Out: 9373 [SKAJG:8115; Chest Tube:70] Intake/Output this shift: No intake/output data recorded.  Lab Results:  CBC: Recent Labs    07/23/18 0410 07/25/18 0420  WBC 10.1 11.9*  HGB 10.1* 10.4*  HCT 30.9* 32.4*  PLT 197 291    BMET:  Recent Labs    07/24/18 0435 07/25/18 0420  NA 131* 130*  K 3.5 3.4*  CL 86* 85*  CO2 35* 36*  GLUCOSE 130* 145*  BUN 31* 25*  CREATININE 1.19 1.04  CALCIUM 8.1* 8.1*     PT/INR:   Recent Labs    07/25/18 0420  LABPROT 20.1*  INR 1.73    CBG (last 3)  Recent Labs    07/24/18 1616 07/24/18 2129 07/25/18 0702  GLUCAP 87 147* 102*    ABG    Component Value Date/Time   PHART 7.369 07/19/2018 0718   PCO2ART 36.3 07/19/2018 0718   PO2ART 91.0  07/19/2018 0718   HCO3 20.9 07/19/2018 0718   TCO2 27 07/21/2018 1714   ACIDBASEDEF 4.0 (H) 07/19/2018 0718   O2SAT 68.5 07/21/2018 0441    CXR: Much improved opacity left lung.  Small PTX on right stable/decreased.  Small right pleural effusion  Assessment/Plan: S/P Procedure(s) (LRB): MITRAL VALVE (MV) REPLACEMENT WITH CARBOMEDICS OPTIFORM MITRAL VALVE SIZE 33. (N/A) MAZE (N/A) TRICUSPID VALVE REPAIR WITH EDWARDS MC3 TRICUSPID ANNULOPLASTY RING SIZE 30. (N/A) TRANSESOPHAGEAL ECHOCARDIOGRAM (TEE) (N/A)  Clinically stable, improving Rate-controlled Afib w/ stable BP, anticoagulated on warfarin Breathing comfortably w/ O2 sats  97-98% on 3 L/min via [place] Bilateral pneumothorax after chest tube removal w/ delayed complete collapse of left lung yesterday, s/p chest tube placement - no air leak and left lung reexpanded Reexpansion pulmonary edema vs HCAP left lung - CXR much improved this morning Acute on chroniccombined systolic anddiastolic CHF with right-sided CHFwith expected post-op volume excess,diuresing on lasix drip, I/O's negative 4.5 liters yesterday and weight down Hypokalemia, induced by loop diuretics Severe pulmonary hypertension Expected post op acute blood loss anemia,mild, stable Morbid obesity OSA   Continue empiric Vanc + Maxepime for possible HCAP but will plan only 3 days if clinical course not c/w infection  Continue lasix drip  Chest tube to water seal, possibly d/c  tomorrow  D/C pacing wires  Supplement potassium  Coumadin  Mobilize  Transfer step down  Rexene Alberts, MD 07/25/2018 8:36 AM

## 2018-07-25 NOTE — Discharge Summary (Signed)
[address]       [place] [address]             [phone]      Physician Discharge Summary  Patient ID: Jeffrey Larson MRN: [id number] DOB/AGE: 05/12/1956 62 y.o.  Admit date: 07/18/2018 Discharge date: 07/31/2018  Admission Diagnoses: Patient Active Problem List   Diagnosis Date Noted  . Tricuspid valve insufficiency 07/15/2018  . Persistent atrial fibrillation ([place]) 07/15/2018  . Morbid obesity ([place])   . Chronic combined systolic and diastolic congestive heart failure ([place])   . Pulmonary hypertension ([place])   . Cold sore 10/28/2017  . Neck pain 10/01/2017  . Coronary artery disease   . Controlled type 2 diabetes mellitus without complication, without long-term current use of insulin ([place]) 08/02/2017  . Cardiac disease 04/18/2017  . Longstanding persistent atrial fibrillation ([place])   . Acute on chronic combined systolic and diastolic CHF (congestive heart failure) ([place]) 11/12/2016  . Right shoulder pain 07/09/2016  . Bilateral low back pain without sciatica 04/30/2016  . Hematuria, microscopic 04/30/2016  . COPD with chronic bronchitis ([place]) 09/08/2014  . Mitral stenosis with regurgitation   . History of MI (myocardial infarction) 08/04/2014  . Hyperlipidemia 08/04/2014  . Essential hypertension, benign 08/04/2014    Discharge Diagnoses:   Patient Active Problem List   Diagnosis Date Noted  . Pressure injury of skin 07/30/2018  . S/P mitral valve replacement with metallic valve + tricuspid valve repair + maze procedure 07/18/2018  . S/P TVR (tricuspid valve repair) 07/18/2018  . S/P Maze operation for atrial fibrillation 07/18/2018  . Tricuspid valve insufficiency 07/15/2018  . Persistent atrial fibrillation ([place]) 07/15/2018  . Morbid obesity ([place])   . Chronic combined systolic and diastolic congestive heart failure ([place])   . Pulmonary hypertension ([place])   . Cold sore 10/28/2017  . Neck pain 10/01/2017  . Coronary artery disease   .  Controlled type 2 diabetes mellitus without complication, without long-term current use of insulin ([place]) 08/02/2017  . Cardiac disease 04/18/2017  . Longstanding persistent atrial fibrillation ([place])   . Acute on chronic combined systolic and diastolic CHF (congestive heart failure) ([place]) 11/12/2016  . Right shoulder pain 07/09/2016  . Bilateral low back pain without sciatica 04/30/2016  . Hematuria, microscopic 04/30/2016  . COPD with chronic bronchitis ([place]) 09/08/2014  . Mitral stenosis with regurgitation   . History of MI (myocardial infarction) 08/04/2014  . Hyperlipidemia 08/04/2014  . Essential hypertension, benign 08/04/2014   Discharged Condition: good  HPI:   Patient is a 62 year old morbidly obese male with history of coronary artery disease status post acute myocardial infarction in 2013 treated with multivessel PCI and stenting, chronic combined systolic and diastolic congestive heart failure, long-standing persistent atrial fibrillation, and COPD with chronic bronchitis who has been referred for surgical consultation to discuss treatment options for management of likely rheumatic heart disease with severe symptomatic mitral stenosis and mitral regurgitation.  The patient states that he first began to experience symptoms of exertional shortness of breath 8 or 9 years ago. In 2013 he suffered an acute myocardial infarction and was treated with multivessel PCI and stenting. He was noted to be in atrial fibrillation at that time and started on warfarin anticoagulation. He has been followed intermittently ever since by Dr. [name]. He states that symptoms of exertional shortness of breath and lower extremity edema have gradually progressed. Echocardiograms have documented the presence of preserved left ventricular systolic function with likely rheumatic  mitral valve disease and severe mitral stenosis. Over the past 2 to 3 years patient symptoms have limited his physical activity  considerably, and recently he finds that he cannotdo much of anything without getting short of breath. He denies resting shortness of breath but he gets short of breath with very low level activity, such as just walking to his garage. He denies history of PND, palpitations, dizzy spells, or syncope. He has chronic lower extremity edema and takes Lasix on a daily basis. He denies any history of exertional chest pain or chest tightness although he reports some occasional episodes of tightness across his chest that seem to come and go without specific activities. He was seen in follow-up recently by Dr. [name] and scheduled for elective TEE and catheterization. Catheterization revealed moderate nonobstructive coronary artery disease with continued patency of stents placed previously in the right coronary artery in the left anterior descending coronary artery. There was severe pulmonary hypertension. Mean transvalvular gradient across the mitral valve was estimated 17.5 mmHg corresponding to mitral valve area calculated 0.84 cm. TEE confirmed the presence of likely rheumatic mitral valve disease with severe mitral stenosis and moderate mitral regurgitation. There was moderate left ventricular systolic dysfunction with ejection fraction estimated only 35 to 40%. There appeared to be thrombus in the left atrial appendage. Right ventricular function was mildly reduced. There was moderate tricuspid regurgitation. Cardiothoracic surgical consultation was requested.  Patient is married and lives in [place] with his wife. He has 2 adult children, neither of whom live close by. 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 He has no other significant physical limitations and he specifically denies any problems with ambulation. He admits  that he has been overweight for most of his adult life and he lives a sedentary lifestyle. He now gets short of breath with very low level activity. He states that he gets episodes of "bronchitis" 2 or 3 times every year during which time his breathing is "terrible".  Patient is a 62 year old morbidly obese male with history of coronary artery disease status post acute myocardial infarction in 2013 treated with multivessel PCI and stenting, chronic combined systolic and diastolic congestive heart failure, long-standing persistent atrial fibrillation, and COPD with chronic bronchitis whoreturns to the office today for follow-up of likely rheumatic heart disease with severe mitral stenosis, moderate mitral regurgitation, tricuspid regurgitation. The patient was originally seen in consultation on June 16, 2018 during his recent hospitalization for acute exacerbation of chronic combined systolic and diastolic congestive heart failure. At that time we made tentative plans for elective mitral valve replacement, possible tricuspid valve repair, and Maze procedure later this week. He reports that since hospital discharge she has done quite well on his current medical regimen. Lower extremity edema has improved considerably. Weight is been stable. His breathing is much better. He now gets short of breath only with moderate level activity. He denies any productive cough. He has not had fevers or chills. Appetite is stable. He looks forward to proceeding with surgery later this week as originally planned. He stopped taking Coumadin last week and is now on daily Lovenox injections for bridging therapy.   Hospital Course:   On 07/18/2018 Mr. Conely underwent a mitral valve replacement, tricuspid valve repair, and maze procedure with Dr. Roxy Manns.  He tolerated the procedure well and was transferred to the  surgical ICU.  He was extubated in a timely manner.  Postop day 1 he remained on milrinone, neo-,  Levophed, and epinephrine.  We continued his swollen and A-line.  His cardiac index was excellent.  We discontinued his mediastinal chest tube.  His blood glucose still remains high therefore we continued IV insulin drip.  Postop day 2 he was found to be in atrial fibrillation under his pacemaker.  He continued on milrinone and norepinephrine.  We restarted low-dose Coumadin.  He did have some expected postoperative atelectasis therefore we initiated a diuretic regimen.  Postop day 3 he remained paced on DDD.  He was sinus rhythm monitor the pacemaker therefore we turned off the pacemaker.  His blood pressure improved without pacing.  He remained on Levophed and milrinone.  We continue to wean his pressor medication and continue Coumadin.  We started amiodarone p.o.  Postop day 4 he remained in normal sinus rhythm and his blood pressure was well controlled on a small amount of Levophed.  He did have occasional episodes of bradycardia therefore we left his epicardial pacing wires in place.  He was tolerating 2 L nasal cannula with good oxygenation.  We continued a Lasix drip at 10 mg/hr.  On postop day 5 he had a new large left pneumothorax and a pigtail catheter was placed with complete resolution of the pneumothorax.  He was breathing comfortably on 2 L nasal cannula with occasional shortness of breath.  We held his Coumadin for pigtail catheter placement.  We decreased his amiodarone dose to 200 mg twice daily.  He did have a syncopal episode about 5 to 10 minutes after chest tube placement.  He became apneic and unresponsive without a change in heart rhythm and was placed in Trendelenburg position and woke up in 1 to 2 minutes.  On postop day 6 he continued to diuresis and continued to have fluid overload.  Status post chest tube placement yesterday with left lung reexpansion.  We started him on empiric Vanc and Maxipime for possible H CAP.  We increase his Lasix drip.  Continue his chest tube for now.  We  restarted his Coumadin at a reduced dose.  Postop day 7 he remained clinically stable and improving.  He remained in rate controlled atrial fibrillation with stable blood pressure.  He was breathing comfortably on 3 L/min nasal cannula.  We continued antibiotics for H CAP.  Replace his chest tube to waterseal.  We plan to transfer the patient to the telemetry unit for continued care.  He continued to make progress.  He remained significantly volume overloaded.  Echocardiogram was obtained and showed the patient to have right sided heart failure.  The AHF team was consulted and aggressively diuresed the patient and optimized his heart failure medications.  He was taken off Amiodarone as his atrial fibrillation is chronic.  He was started on Digoxin and his BB was resumed at a reduced dose due to bradycardia.  He remains on coumadin for history of chronic Atrial Fibrillation and his recent surgery.  His INR is 1.76, he will be discharged home on 6 mg coumadin daily with a INR goal of 2.0-3.0.  He is ambulating independently.  His incisions are healing without evidence of infection.  He is medically stable for discharge home today.    Consults: None  Significant Diagnostic Studies:   CLINICAL DATA:  Follow-up chest tube  EXAM: CHEST - 2 VIEW  COMPARISON:  07/24/2018  FINDINGS: The left apical pneumothorax seen previously  has resolved in the interval. Small bore chest tube is again noted better visualized on today's exam. Postsurgical changes are seen. Cardiac shadow remains enlarged. Right apical pneumothorax is stable with increasing fluid component inferiorly. Right-sided PICC line is noted and stable.  IMPRESSION: Resolution of previously seen left pneumothorax. Improved aeration in the left lung is noted.  New fluid component of small right apical pneumothorax. The pneumothorax has improved in the interval from the prior exam.   Electronically Signed   By: Inez Catalina M.D.    On: 07/25/2018 09:00  Treatments:    CARDIOTHORACIC SURGERY OPERATIVE NOTE  Date of Procedure:                07/23/2018  Preoperative Diagnosis:      Left Pneumothorax  Postoperative Diagnosis:    Same  Procedure:                             Left chest tube placement  Surgeon:                                Valentina Gu. Roxy Manns, MD  Anesthesia:    1% lidocaine local     DETAILS OF THE OPERATIVE PROCEDURE  Following full informed consent the patient was monitored for rhythm, BP and oxygen saturation. The left anterior chest was prepared and draped in a sterile manner. 1% lidocaine was utilized to anesthetize the skin and subcutaneous tissues. A small incision was made and a 16 French Cook chest tube was placed through the incision into the pleural space using the Seldinger technique. The tube was secured to the skin and connected to a closed suction collection device. The patient tolerated the procedure well. A portable CXR was ordered. There were no complications.    Valentina Gu. Roxy Manns, MD 07/23/2018 2:35 PM   Discharge Exam: Blood pressure 105/70, pulse 68, temperature 98.1 F (36.7 C), temperature source Oral, resp. rate 20, height 5\' 11"  (1.803 m), weight 129.8 kg, SpO2 95 %.   General appearance: alert, cooperative and no distress Heart: irregularly irregular rhythm Lungs: clear to auscultation bilaterally Abdomen: soft, non-tender; bowel sounds normal; no masses,  no organomegaly Extremities: edema trace, improved significantly Wound: clean and dry   Disposition: Home  Discharge Medications:  The patient has been discharged on:   1.Beta Blocker:  Yes [x   ]                              No   [   ]                              If No, reason:  2.Ace Inhibitor/ARB: Yes [ x  ]                                     No  [    ]                                     If No, reason:  3.Statin:   Yes [ x  ]  No  [   ]                  If No,  reason:  4.Ecasa:  Yes  [ x  ]                  No   [   ]                  If No, reason:     Discharge Instructions    Amb Referral to Cardiac Rehabilitation   Complete by:  As directed    Diagnosis:   Valve Replacement Valve Repair     Valve:  Mitral     Allergies as of 07/31/2018      Reactions   Ramipril Cough   cough      Medication List    STOP taking these medications   enoxaparin 150 MG/ML injection Commonly known as:  LOVENOX   furosemide 40 MG tablet Commonly known as:  LASIX   potassium chloride SA 20 MEQ tablet Commonly known as:  K-DUR,KLOR-CON     TAKE these medications   acetaminophen 500 MG tablet Commonly known as:  TYLENOL Take 1,000 mg by mouth 2 (two) times daily.   Albuterol Sulfate 108 (90 Base) MCG/ACT Aepb Inhale 1 puff into the lungs every 6 (six) hours as needed (sob).   ALDACTONE 25 MG tablet Generic drug:  spironolactone Take 1 tablet (25 mg total) by mouth daily.   AMBULATORY NON FORMULARY MEDICATION Medication Name: Glucometer, lancets and strip to test every other day. Dx: Diabetes type 2, E11.9. 100 strip and 100 lancets   aspirin 81 MG tablet Take 81 mg by mouth daily.   atorvastatin 40 MG tablet Commonly known as:  LIPITOR Take 40 mg by mouth every evening.   digoxin 0.125 MG tablet Commonly known as:  LANOXIN Take 1 tablet (0.125 mg total) by mouth daily.   ENTRESTO 24-26 MG Generic drug:  sacubitril-valsartan Take 1 tablet by mouth 2 (two) times daily.   Fish Oil 1200 MG Caps Take 1,200 mg by mouth daily.   Fluticasone-Umeclidin-Vilant 100-62.5-25 MCG/INH Aepb Inhale 1 puff into the lungs daily.   metoprolol succinate 50 MG 24 hr tablet Commonly known as:  TOPROL-XL Take 1 tablet (50 mg total) by mouth daily. Take with or immediately following a meal. What changed:  when to take this   nitroGLYCERIN 0.4 MG SL tablet Commonly known as:  NITROSTAT Place 1 tablet (0.4 mg total) under the tongue every 5  (five) minutes x 3 doses as needed for chest pain.   oxymetazoline 0.05 % nasal spray Commonly known as:  AFRIN Place 1 spray into both nostrils daily.   torsemide 20 MG tablet Commonly known as:  DEMADEX Take 3 tablets (60 mg total) by mouth daily. Start taking on:  08/01/2018   traMADol 50 MG tablet Commonly known as:  ULTRAM Take 1 tablet (50 mg total) by mouth every 6 (six) hours as needed for moderate pain.   warfarin 6 MG tablet Commonly known as:  COUMADIN Take 1 tablet (6 mg total) by mouth every evening. What changed:    medication strength  how much to take      Follow-up Information    [facility]-Home Follow up.   Specialty:  Home Health Services Why:  Physical Therapy Contact information: [address] [place] [address] [facility] - Dme Follow up.   Why:  3-in-1 bedside commode Contact information: [address] [place] [address] [phone]        Rexene Alberts, MD Follow up.   Specialty:  Cardiothoracic Surgery Why:  Your routine follow-up appointment is on 08/12/2018 at 1:00 PM.  Please arrive at 12:30 PM for a chest x-ray located at [facility] imaging which is the first floor of our building. Contact information: [address] [place] [address] [phone]        Arnoldo Lenis, MD Follow up.   Specialty:  Cardiology Contact information: [address] [place] [address] [phone]           Signed: Original Note Dictated by Nicholes Rough PA-C  [place] by: Ellwood Handler 07/31/2018, 8:42 AM

## 2018-07-25 NOTE — Progress Notes (Signed)
Physical Therapy Treatment Patient Details Name: Jeffrey Larson MRN: [id number] DOB: Jun 12, 1956 Today's Date: 07/25/2018    History of Present Illness 62 yo admitted for MVR and tricuspid valve repair 8/23 with pulmonary HTN. HCAP w/ bil PTX, chest tube inserted 8/29. PMhx: CAD, MI, CHF, COPD, HLD, obesity    PT Comments    Pt pleasant on arrival, saying he has been using his spirometer since last visit and walked a full lap w/ nurses this morning. Pt breathing has improved since last visit, he felt "chest tube has made a world of difference." Pt believes will d/c Monday, pt advised we will try to do stairs prior as pt has 3 steps to get inside the home. Pt was not ready to try today.     Follow Up Recommendations  Home health PT;Supervision/Assistance - 24 hour     Equipment Recommendations  3in1 (PT)    Recommendations for Other Services       Precautions / Restrictions Precautions Precautions: Sternal;Fall Precaution Comments: sternal precautions reviewed in beginning and end, able to recall no pushing and keep arms in, needs additional cueing and reinforcement  Restrictions Weight Bearing Restrictions: Yes(sternal )    Mobility  Bed Mobility               General bed mobility comments: in chair on arrival  Transfers Overall transfer level: Needs assistance Equipment used: None Transfers: Sit to/from Stand Sit to Stand: Supervision         General transfer comment: supervision for lines, ableto maintain precautions w/o cues, no momentum needed to stand   Ambulation/Gait Ambulation/Gait assistance: Min guard Gait Distance (Feet): 400 Feet Assistive device: Rolling walker (2 wheeled) Gait Pattern/deviations: Step-through pattern;Decreased stride length   Gait velocity interpretation: >4.37 ft/sec, indicative of normal walking speed General Gait Details: encouraged to take standing rest breaks for energy conservation and breathing. Pt w/ tenedency to walk  very quickly w/ increased unsteadiness, pt encouraged to walk at normal, slower pace for steadiness.    Stairs             Wheelchair Mobility    Modified Rankin (Stroke Patients Only)       Balance Overall balance assessment: Needs assistance Sitting-balance support: Feet supported;No upper extremity supported Sitting balance-Leahy Scale: Good Sitting balance - Comments: cues to keep elbows in at sides to adhere to sternal precautions.    Standing balance support: No upper extremity supported Standing balance-Leahy Scale: Fair Standing balance comment: pt able to release RW                            Cognition Arousal/Alertness: Awake/alert Behavior During Therapy: WFL for tasks assessed/performed Overall Cognitive Status: Within Functional Limits for tasks assessed                                 General Comments: pt able to recall keeping arms in and no pushing end of session      Exercises      General Comments        Pertinent Vitals/Pain Pain Assessment: No/denies pain    Home Living                      Prior Function            PT Goals (current goals can now be found in the care  plan section) Progress towards PT goals: Progressing toward goals    Frequency    Min 3X/week      PT Plan Current plan remains appropriate    Co-evaluation              AM-PAC PT "6 Clicks" Daily Activity  Outcome Measure  Difficulty turning over in bed (including adjusting bedclothes, sheets and blankets)?: A Lot Difficulty moving from lying on back to sitting on the side of the bed? : Unable Difficulty sitting down on and standing up from a chair with arms (e.g., wheelchair, bedside commode, etc,.)?: A Little Help needed moving to and from a bed to chair (including a wheelchair)?: A Little Help needed walking in hospital room?: A Little Help needed climbing 3-5 steps with a railing? : A Lot 6 Click Score: 14     End of Session Equipment Utilized During Treatment: Gait belt;Oxygen Activity Tolerance: Patient tolerated treatment well Patient left: with call bell/phone within reach;in chair Nurse Communication: Mobility status;Precautions PT Visit Diagnosis: Other abnormalities of gait and mobility (R26.89);Muscle weakness (generalized) (M62.81)     Time: 0630-1601 PT Time Calculation (min) (ACUTE ONLY): 21 min  Charges:  $Gait Training: 8-22 mins                     Samuella Bruin, [place]  Acute Rehab [phone]    Samuella Bruin 07/25/2018, 1:50 PM

## 2018-07-26 ENCOUNTER — Inpatient Hospital Stay (HOSPITAL_COMMUNITY): Payer: BLUE CROSS/BLUE SHIELD

## 2018-07-26 LAB — BASIC METABOLIC PANEL
Anion gap: 8 (ref 5–15)
BUN: 23 mg/dL (ref 8–23)
CALCIUM: 8.5 mg/dL — AB (ref 8.9–10.3)
CO2: 35 mmol/L — AB (ref 22–32)
Chloride: 87 mmol/L — ABNORMAL LOW (ref 98–111)
Creatinine, Ser: 1.07 mg/dL (ref 0.61–1.24)
GFR calc non Af Amer: >60 mL/min (ref >60)
GLUCOSE: 130 mg/dL — AB (ref 70–99)
POTASSIUM: 4.1 mmol/L (ref 3.5–5.1)
Sodium: 130 mmol/L — ABNORMAL LOW (ref 135–145)

## 2018-07-26 LAB — PROTIME-INR
INR: 1.74
Prothrombin Time: 20.2 seconds — ABNORMAL HIGH (ref 11.4–15.2)

## 2018-07-26 LAB — MAGNESIUM: Magnesium: 2.1 mg/dL (ref 1.7–2.4)

## 2018-07-26 MED ORDER — SACUBITRIL-VALSARTAN 24-26 MG PO TABS
1.0000 | ORAL_TABLET | Freq: Two times a day (BID) | ORAL | Status: DC
  Administered 2018-07-26 – 2018-07-31 (×11): 1 via ORAL
  Filled 2018-07-26 (×11): qty 1

## 2018-07-26 MED ORDER — POTASSIUM CHLORIDE CRYS ER 20 MEQ PO TBCR
40.0000 meq | EXTENDED_RELEASE_TABLET | Freq: Three times a day (TID) | ORAL | Status: DC
  Administered 2018-07-26 – 2018-07-31 (×15): 40 meq via ORAL
  Filled 2018-07-26 (×15): qty 2

## 2018-07-26 NOTE — Progress Notes (Signed)
Received Jeffrey Larson from 2 Heart @ 1310pm.  Patient is awake, alert and oriented x 4.  VSS.  BBS clear through out.  O2 Sat 96% on room air.  Intermittent congested productive cough with tan secretions.  Heart sounds with a click, Afib with a rate of 66.  Abdomen soft with positive bowel sounds x 4.  Three + pitting edema in the bilateral lower extremities.  Palpable pedal pulses.  Picc line in the right upper medial arm with NS running at 5 cc/hr and Lasix running at 10 cc/hr via purple port and NS at 10 cc/hr via red port via alaris pumps, site CDI.  Saline lock to left hand, site CDI.  Orientation to room and department, verbalized understanding. Placed on cardiac monitor and call placed to CCMD.

## 2018-07-26 NOTE — Progress Notes (Addendum)
[facility] [address]       [place] [address]             [phone]        CARDIOTHORACIC SURGERY PROGRESS NOTE   R8 Days Post-Op Procedure(s) (LRB): MITRAL VALVE (MV) REPLACEMENT WITH CARBOMEDICS OPTIFORM MITRAL VALVE SIZE 33. (N/A) MAZE (N/A) TRICUSPID VALVE REPAIR WITH EDWARDS MC3 TRICUSPID ANNULOPLASTY RING SIZE 30. (N/A) TRANSESOPHAGEAL ECHOCARDIOGRAM (TEE) (N/A)  Subjective: Feels well.  A little better every day.  Some dyspnea w/ activity but improved.  No complaints  Objective: Vital signs: BP Readings from Last 1 Encounters:  07/26/18 (!) 104/52   Pulse Readings from Last 1 Encounters:  07/26/18 66   Resp Readings from Last 1 Encounters:  07/26/18 18   Temp Readings from Last 1 Encounters:  07/26/18 (!) 97.3 F (36.3 C) (Oral)    Hemodynamics:    Physical Exam:  Rhythm:   Afib w/ controlled rate  Breath sounds: clear  Heart sounds:  RRR  Incisions:  Clean and dry  Abdomen:  Soft, non-distended, non-tender  Extremities:  Warm, well-perfused, swollen   Intake/Output from previous day: 08/30 0701 - 08/31 0700 In: 1990.6 [P.O.:984; I.V.:239; IV Piggyback:767.6] Out: 5155 [Urine:5125; Chest Tube:30] Intake/Output this shift: Total I/O In: 763.8 [I.V.:30; IV Piggyback:733.8] Out: 225 [Urine:225]  Lab Results:  CBC: Recent Labs    07/25/18 0420  WBC 11.9*  HGB 10.4*  HCT 32.4*  PLT 291    BMET:  Recent Labs    07/25/18 0420 07/26/18 0550  NA 130* 130*  K 3.4* 4.1  CL 85* 87*  CO2 36* 35*  GLUCOSE 145* 130*  BUN 25* 23  CREATININE 1.04 1.07  CALCIUM 8.1* 8.5*     PT/INR:   Recent Labs    07/26/18 0550  LABPROT 20.2*  INR 1.74    CBG (last 3)  Recent Labs    07/25/18 0702 07/25/18 1156 07/25/18 1633  GLUCAP 102* 101* 114*    ABG    Component Value Date/Time   PHART 7.369 07/19/2018 0718   PCO2ART 36.3 07/19/2018 0718   PO2ART 91.0 07/19/2018 0718   HCO3 20.9 07/19/2018 0718   TCO2 27 07/21/2018  1714   ACIDBASEDEF 4.0 (H) 07/19/2018 0718   O2SAT 68.5 07/21/2018 0441    CXR: CHEST - 2 VIEW  COMPARISON:  Yesterday  FINDINGS: Small biapical pneumothorax without change from yesterday. Left-sided chest tube is in similar position. Right upper extremity PICC with tip at the SVC. Marked cardiomegaly with mitral valve replacement and left atrial clipping. Small pleural effusions and interstitial coarsening.  IMPRESSION: 1. Trace left and small right apical pneumothorax. No change from yesterday. 2. Cardiomegaly and vascular congestion with small effusions.   Electronically Signed   By: Monte Fantasia M.D.   On: 07/26/2018 08:20   Assessment/Plan: S/P Procedure(s) (LRB): MITRAL VALVE (MV) REPLACEMENT WITH CARBOMEDICS OPTIFORM MITRAL VALVE SIZE 33. (N/A) MAZE (N/A) TRICUSPID VALVE REPAIR WITH EDWARDS MC3 TRICUSPID ANNULOPLASTY RING SIZE 30. 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

## 2018-07-26 NOTE — Progress Notes (Signed)
CARDIAC REHAB PHASE I   PRE:  Rate/Rhythm: 61 Afib  BP:  Supine:   Sitting: 91/43  Standing:    SaO2: 97% RA  MODE:  Ambulation: 450 ft   POST:  Rate/Rhythm: 89 Afib  BP:  Supine:   Sitting: 112/65  Standing:    SaO2: 95% RA  1445-1540 Patient tolerated ambulation fair with assist x1 and pushing rolling walker. Gait slow, steady, pt c/o mild SOB/wheezing after walk, initial SaO2 was 92% increased to 95% room air. To chair after walk with call bell within reach, IV intact. OHS education reviewed including sternal precautions, IS use, deep breathing/coughing, restrictions, risk factor modification, heart healthy diet, and activity progression. Safe Movement After Surgery handout reviewed and given to pt along with instructions on viewing the Recovering from Surgery video. Pt verbalizes understanding of instructions given. Discussed phase 2 cardiac rehab, and pt is interested in the program at [facility], referral sent.  Sol Passer, MS, ACSM CEP

## 2018-07-27 ENCOUNTER — Inpatient Hospital Stay (HOSPITAL_COMMUNITY): Payer: BLUE CROSS/BLUE SHIELD

## 2018-07-27 ENCOUNTER — Other Ambulatory Visit (HOSPITAL_COMMUNITY): Payer: BLUE CROSS/BLUE SHIELD

## 2018-07-27 LAB — BASIC METABOLIC PANEL
Anion gap: 9 (ref 5–15)
BUN: 26 mg/dL — AB (ref 8–23)
CALCIUM: 8.4 mg/dL — AB (ref 8.9–10.3)
CO2: 32 mmol/L (ref 22–32)
CREATININE: 1.12 mg/dL (ref 0.61–1.24)
Chloride: 90 mmol/L — ABNORMAL LOW (ref 98–111)
GFR calc Af Amer: >60 mL/min (ref >60)
GLUCOSE: 106 mg/dL — AB (ref 70–99)
Potassium: 4 mmol/L (ref 3.5–5.1)
Sodium: 131 mmol/L — ABNORMAL LOW (ref 135–145)

## 2018-07-27 LAB — PROTIME-INR
INR: 1.64
Prothrombin Time: 19.2 seconds — ABNORMAL HIGH (ref 11.4–15.2)

## 2018-07-27 MED ORDER — FUROSEMIDE 40 MG PO TABS: 40.0000 mg | ORAL_TABLET | Freq: Two times a day (BID) | ORAL | Status: DC

## 2018-07-27 NOTE — Progress Notes (Addendum)
[address]       ,[place] [address]             [phone]      9 Days Post-Op Procedure(s) (LRB): MITRAL VALVE (MV) REPLACEMENT WITH CARBOMEDICS OPTIFORM MITRAL VALVE SIZE 33. (N/A) MAZE (N/A) TRICUSPID VALVE REPAIR WITH EDWARDS MC3 TRICUSPID ANNULOPLASTY RING SIZE 30. (N/A) TRANSESOPHAGEAL ECHOCARDIOGRAM (TEE) (N/A) Subjective: He feels good today. He feels like he is walking better and is less short of breath.   Objective: Vital signs in last 24 hours: Temp:  [97.8 F (36.6 C)-98.5 F (36.9 C)] 98 F (36.7 C) (09/01 0748) Pulse Rate:  [52-68] 52 (09/01 0800) Cardiac Rhythm: Atrial fibrillation (09/01 0800) Resp:  [16-22] 22 (09/01 0800) BP: (89-109)/(51-55) 94/52 (09/01 0800) SpO2:  [93 %-97 %] 95 % (09/01 0800) Weight:  [742 kg] 136 kg (09/01 0505)     Intake/Output from previous day: 08/31 0701 - 09/01 0700 In: 2245.8 [P.O.:780; I.V.:232; IV Piggyback:1233.8] Out: 2225 [Urine:2225] Intake/Output this shift: Total I/O In: 557.5 [P.O.:240; I.V.:67.5; IV Piggyback:250] Out: 1050 [Urine:1050]  General appearance: alert, cooperative and no distress Heart:irregularly irregular Lungs: clear to auscultation bilaterally Abdomen: soft, non-tender; bowel sounds normal; no masses,  no organomegaly Extremities: 1-2+ edema nonpitting and bilateral Wound: clean and dry  Lab Results: Recent Labs    07/25/18 0420  WBC 11.9*  HGB 10.4*  HCT 32.4*  PLT 291   BMET:  Recent Labs    07/26/18 0550 07/27/18 0322  NA 130* 131*  K 4.1 4.0  CL 87* 90*  CO2 35* 32  GLUCOSE 130* 106*  BUN 23 26*  CREATININE 1.07 1.12  CALCIUM 8.5* 8.4*    PT/INR:  Recent Labs    07/27/18 0322  LABPROT 19.2*  INR 1.64   ABG    Component Value Date/Time   PHART 7.369 07/19/2018 0718   HCO3 20.9 07/19/2018 0718   TCO2 27 07/21/2018 1714   ACIDBASEDEF 4.0 (H) 07/19/2018 0718   O2SAT 68.5 07/21/2018 0441   CBG (last 3)  Recent Labs    07/25/18 0702  07/25/18 1156 07/25/18 1633  GLUCAP 102* 101* 114*    Assessment/Plan: S/P Procedure(s) (LRB): MITRAL VALVE (MV) REPLACEMENT WITH CARBOMEDICS OPTIFORM MITRAL VALVE SIZE 33. (N/A) MAZE (N/A) TRICUSPID VALVE REPAIR WITH EDWARDS MC3 TRICUSPID ANNULOPLASTY RING SIZE 30. (N/A) TRANSESOPHAGEAL ECHOCARDIOGRAM (TEE) (N/A)  1. CV-rate controlled atrial fibrillation, on coumadin 4mg  with INR 1.64. BP soft at times. On Amio 200mg  BID, asa, and statin. Also on Entresto at his home dose.  2. Pulm-tolerating room air with good oxygen saturation. CXR showed no left pneumothorax but there was a stable very small right pneumothorax. Bilateral small pleural effusions.  3. 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 Also on Spironolactone.  4. Renal-creatinine 1.12, electrolytes okay.  5. Expected post op acute blood loss anemia, stable 6. Blood glucose well controlled on current regimen  Plan: Wean off lasix drip. Will need to switch to oral dosing before discharge. He is ambulating well with less shortness of breath. He is on room air. Continue coumadin-remains in rate-controlled afib.     LOS: 9 days    Elgie Collard 07/27/2018  I have seen and examined the patient and agree with the assessment and plan as outlined.  However, still needs more diuresis.  He was in severe volume overload w/ acute on chronic diastolic  and right heart failure preop - dry baseline weight unknown.  Continue lasix drip and Spironolactone.  ECHO ordered yesterday, still pending.  Will check co-ox with labs tomorrow  Rexene Alberts, MD 07/27/2018 4:54 PM

## 2018-07-27 NOTE — Plan of Care (Signed)
Care plans reviewed and patient is progressing.  

## 2018-07-27 NOTE — Progress Notes (Signed)
Pt ambulated in hall with front wheel walker 470 ft on room air. Pt tolerated ambulation well. Will continue to follow.

## 2018-07-28 ENCOUNTER — Inpatient Hospital Stay (HOSPITAL_COMMUNITY): Payer: BLUE CROSS/BLUE SHIELD

## 2018-07-28 LAB — COMPREHENSIVE METABOLIC PANEL
ALBUMIN: 2.5 g/dL — AB (ref 3.5–5.0)
ALK PHOS: 198 U/L — AB (ref 38–126)
ALT: 34 U/L (ref 0–44)
ANION GAP: 10 (ref 5–15)
AST: 33 U/L (ref 15–41)
BUN: 28 mg/dL — ABNORMAL HIGH (ref 8–23)
CHLORIDE: 91 mmol/L — AB (ref 98–111)
CO2: 29 mmol/L (ref 22–32)
Calcium: 8.4 mg/dL — ABNORMAL LOW (ref 8.9–10.3)
Creatinine, Ser: 1.18 mg/dL (ref 0.61–1.24)
GFR calc non Af Amer: >60 mL/min (ref >60)
GLUCOSE: 153 mg/dL — AB (ref 70–99)
Potassium: 4.2 mmol/L (ref 3.5–5.1)
SODIUM: 130 mmol/L — AB (ref 135–145)
Total Bilirubin: 1.4 mg/dL — ABNORMAL HIGH (ref 0.3–1.2)
Total Protein: 6 g/dL — ABNORMAL LOW (ref 6.5–8.1)

## 2018-07-28 LAB — CBC
HEMATOCRIT: 33.7 % — AB (ref 39.0–52.0)
Hemoglobin: 10.7 g/dL — ABNORMAL LOW (ref 13.0–17.0)
MCH: 28.1 pg (ref 26.0–34.0)
MCHC: 31.8 g/dL (ref 30.0–36.0)
MCV: 88.5 fL (ref 78.0–100.0)
Platelets: 519 10*3/uL — ABNORMAL HIGH (ref 150–400)
RBC: 3.81 MIL/uL — ABNORMAL LOW (ref 4.22–5.81)
RDW: 17 % — AB (ref 11.5–15.5)
WBC: 18.6 10*3/uL — AB (ref 4.0–10.5)

## 2018-07-28 LAB — COOXEMETRY PANEL
CARBOXYHEMOGLOBIN: 1.6 % — AB (ref 0.5–1.5)
Methemoglobin: 1.6 % — ABNORMAL HIGH (ref 0.0–1.5)
O2 SAT: 58.5 %
Total hemoglobin: 11.2 g/dL — ABNORMAL LOW (ref 12.0–16.0)

## 2018-07-28 LAB — ECHOCARDIOGRAM COMPLETE
HEIGHTINCHES: 71 in
WEIGHTICAEL: 4747.83 [oz_av]

## 2018-07-28 LAB — PROTIME-INR
INR: 1.59
PROTHROMBIN TIME: 18.8 s — AB (ref 11.4–15.2)

## 2018-07-28 LAB — BRAIN NATRIURETIC PEPTIDE: B NATRIURETIC PEPTIDE 5: 159.7 pg/mL — AB (ref 0.0–100.0)

## 2018-07-28 MED ORDER — PERFLUTREN LIPID MICROSPHERE
1.0000 mL | INTRAVENOUS | Status: AC | PRN
  Administered 2018-07-28: 3 mL via INTRAVENOUS
  Filled 2018-07-28: qty 10

## 2018-07-28 MED ORDER — ENOXAPARIN SODIUM 40 MG/0.4ML ~~LOC~~ SOLN
40.0000 mg | SUBCUTANEOUS | Status: DC
  Administered 2018-07-28 – 2018-07-30 (×3): 40 mg via SUBCUTANEOUS
  Filled 2018-07-28 (×3): qty 0.4

## 2018-07-28 NOTE — Plan of Care (Signed)
Care plans reviewed and patient is progressing.  

## 2018-07-28 NOTE — Progress Notes (Signed)
cvp of  9

## 2018-07-28 NOTE — Progress Notes (Signed)
Physical Therapy Treatment Patient Details Name: [place] MRN: [id number] DOB: 1956-07-31 Today's Date: 07/28/2018    History of Present Illness 62 yo admitted for MVR and tricuspid valve repair 8/23 with pulmonary HTN. HCAP w/ bil PTX, chest tube inserted 8/29. PMhx: CAD, MI, CHF, COPD, HLD, obesity    PT Comments    Pt doing very well with mobility. No longer needing a rolling walker for ambulation. Do not feel pt will need PT after DC.   Follow Up Recommendations  No PT follow up     Equipment Recommendations  3in1 (PT)    Recommendations for Other Services       Precautions / Restrictions Precautions Precautions: Sternal Precaution Comments: sternal precautions reviewed in beginning and end, able to recall no pushing and keep arms in, needs additional cueing and reinforcement     Mobility  Bed Mobility               General bed mobility comments: Pt standing with nurse on arrival  Transfers                 General transfer comment: Pt standing with nursing on arrival  Ambulation/Gait Ambulation/Gait assistance: Modified independent (Device/Increase time) Gait Distance (Feet): 200 Feet Assistive device: None Gait Pattern/deviations: Step-through pattern;Decreased stride length   Gait velocity interpretation: >4.37 ft/sec, indicative of normal walking speed General Gait Details: Pt amb with steady gait. Distance limited by pt being taken to test   Stairs Stairs: Yes Stairs assistance: Modified independent (Device/Increase time) Stair Management: One rail Left;Step to pattern;Forwards Number of Stairs: 3 General stair comments: Went over 1 step x 3 using sink counter to simulate rail   Wheelchair Mobility    Modified Rankin (Stroke Patients Only)       Balance Overall balance assessment: Independent                                          Cognition Arousal/Alertness: Awake/alert Behavior During Therapy: WFL  for tasks assessed/performed Overall Cognitive Status: Within Functional Limits for tasks assessed                                        Exercises      General Comments        Pertinent Vitals/Pain Pain Assessment: No/denies pain    Home Living                      Prior Function            PT Goals (current goals can now be found in the care plan section) Progress towards PT goals: Progressing toward goals    Frequency    Min 3X/week      PT Plan Discharge plan needs to be updated    Co-evaluation              AM-PAC PT "6 Clicks" Daily Activity  Outcome Measure  Difficulty turning over in bed (including adjusting bedclothes, sheets and blankets)?: A Little Difficulty moving from lying on back to sitting on the side of the bed? : A Little Difficulty sitting down on and standing up from a chair with arms (e.g., wheelchair, bedside commode, etc,.)?: A Little Help needed moving to and from a bed to chair (  including a wheelchair)?: None Help needed walking in hospital room?: None Help needed climbing 3-5 steps with a railing? : None 6 Click Score: 21    End of Session   Activity Tolerance: Patient tolerated treatment well Patient left: Other (comment)(in transport chair for test) Nurse Communication: Mobility status PT Visit Diagnosis: Other abnormalities of gait and mobility (R26.89)     Time: 1364-3837 PT Time Calculation (min) (ACUTE ONLY): 10 min  Charges:  $Gait Training: 8-22 mins                     [place] Pager [phone] Office [place] 07/28/2018, 10:20 AM

## 2018-07-28 NOTE — Progress Notes (Addendum)
[address]       [place] [address]             [phone]      10 Days Post-Op Procedure(s) (LRB): MITRAL VALVE (MV) REPLACEMENT WITH CARBOMEDICS OPTIFORM MITRAL VALVE SIZE 33. (N/A) MAZE (N/A) TRICUSPID VALVE REPAIR WITH EDWARDS MC3 TRICUSPID ANNULOPLASTY RING SIZE 30. (N/A) TRANSESOPHAGEAL ECHOCARDIOGRAM (TEE) (N/A) Subjective: He feels less short of breath today when walking. Had his first bowel movement since surgery  Objective: Vital signs in last 24 hours: Temp:  [98 F (36.7 C)-98.6 F (37 C)] 98 F (36.7 C) (09/02 0849) Pulse Rate:  [59-69] 61 (09/02 0849) Cardiac Rhythm: Atrial fibrillation (09/02 0709) Resp:  [18-22] 18 (09/02 0849) BP: (95-103)/(49-70) 99/49 (09/02 0849) SpO2:  [93 %-96 %] 93 % (09/02 0849) Weight:  [134.6 kg] 134.6 kg (09/02 0425)     Intake/Output from previous day: 09/01 0701 - 09/02 0700 In: 1517.5 [P.O.:1200; I.V.:67.5; IV Piggyback:250] Out: 5329 [Urine:3725] Intake/Output this shift: Total I/O In: -  Out: 200 [Urine:200]  General appearance: alert, cooperative and no distress Heart: regular rate and rhythm, S1, S2 normal, no murmur, click, rub or gallop Lungs: clear to auscultation bilaterally Abdomen: soft, non-tender; bowel sounds normal; no masses,  no organomegaly Extremities: 2-3+ pitting pedal edema Wound: clean and dry. Stable to palpation  Lab Results: Recent Labs    07/28/18 0501  WBC 18.6*  HGB 10.7*  HCT 33.7*  PLT 519*   BMET:  Recent Labs    07/27/18 0322 07/28/18 0501  NA 131* 130*  K 4.0 4.2  CL 90* 91*  CO2 32 29  GLUCOSE 106* 153*  BUN 26* 28*  CREATININE 1.12 1.18  CALCIUM 8.4* 8.4*    PT/INR:  Recent Labs    07/28/18 0501  LABPROT 18.8*  INR 1.59   ABG    Component Value Date/Time   PHART 7.369 07/19/2018 0718   HCO3 20.9 07/19/2018 0718   TCO2 27 07/21/2018 1714   ACIDBASEDEF 4.0 (H) 07/19/2018 0718   O2SAT 58.5 07/28/2018 0500   CBG (last 3)  Recent  Labs    07/25/18 1156 07/25/18 1633  GLUCAP 101* 114*    Assessment/Plan: S/P Procedure(s) (LRB): MITRAL VALVE (MV) REPLACEMENT WITH CARBOMEDICS OPTIFORM MITRAL VALVE SIZE 33. (N/A) MAZE (N/A) TRICUSPID VALVE REPAIR WITH EDWARDS MC3 TRICUSPID ANNULOPLASTY RING SIZE 30. (N/A) TRANSESOPHAGEAL ECHOCARDIOGRAM (TEE) (N/A)  1. CV-rate controlled atrial fibrillation, on coumadin 4mg  with INR 1.64. BP soft at times. On Amio 200mg  BID, asa, and statin. Also on Entresto at his home dose. Echo done today-right heart failure. Consult will be placed to HF for medication optimization.  2. Pulm-tolerating room air with good oxygen saturation. CXR showed no left pneumothorax but there was a stable very small right pneumothorax. Bilateral small pleural effusions.  3. 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 Also on Spironolactone.  4. Renal-creatinine 1.18, electrolytes okay.  5. Expected post op acute blood loss anemia, stable 6. Blood glucose well controlled on current regimen 7. WBC 18.6-no signs of infection with his incision and no UTI symptoms. Encouraged incentive spirometer and pulm toilet. Will continue to monitor. No fever.   Plan: Remains fluid overloaded but improving. HF consult for medication optimization. Overall, making slow forward progress.    LOS: 10 days    Jeffrey Larson 07/28/2018   I have seen and examined  the patient and agree with the assessment and plan as outlined.  Continues to make slow but steady progress.  I have personally reviewed his follow up ECHO which has not yet been officially read.  I think it looks okay except for significant RV dysfunction, as expected.  This causes septal flattening and likely significant impact on LV function as well.  Both valves look good.  Will check f/u co-ox and CVP and ask for the Advanced Heart Failure team to see  in consult.  He may need sildenafil and he will definitely need close f/u after hospital d/c  Jeffrey Alberts, MD 07/28/2018 12:06 PM

## 2018-07-28 NOTE — Progress Notes (Signed)
  Echocardiogram 2D Echocardiogram has been performed.  Johny Chess 07/28/2018, 11:13 AM

## 2018-07-29 ENCOUNTER — Inpatient Hospital Stay (HOSPITAL_COMMUNITY): Payer: BLUE CROSS/BLUE SHIELD

## 2018-07-29 DIAGNOSIS — I482 Chronic atrial fibrillation: Secondary | ICD-10-CM

## 2018-07-29 DIAGNOSIS — Z954 Presence of other heart-valve replacement: Secondary | ICD-10-CM

## 2018-07-29 DIAGNOSIS — I342 Nonrheumatic mitral (valve) stenosis: Secondary | ICD-10-CM

## 2018-07-29 DIAGNOSIS — I5043 Acute on chronic combined systolic (congestive) and diastolic (congestive) heart failure: Secondary | ICD-10-CM

## 2018-07-29 LAB — CBC
HCT: 31.8 % — ABNORMAL LOW (ref 39.0–52.0)
Hemoglobin: 10.1 g/dL — ABNORMAL LOW (ref 13.0–17.0)
MCH: 28.1 pg (ref 26.0–34.0)
MCHC: 31.8 g/dL (ref 30.0–36.0)
MCV: 88.6 fL (ref 78.0–100.0)
Platelets: 478 10*3/uL — ABNORMAL HIGH (ref 150–400)
RBC: 3.59 MIL/uL — AB (ref 4.22–5.81)
RDW: 16.9 % — AB (ref 11.5–15.5)
WBC: 18 10*3/uL — ABNORMAL HIGH (ref 4.0–10.5)

## 2018-07-29 LAB — COOXEMETRY PANEL
Carboxyhemoglobin: 1.7 % — ABNORMAL HIGH (ref 0.5–1.5)
METHEMOGLOBIN: 0.8 % (ref 0.0–1.5)
O2 Saturation: 58.3 %
Total hemoglobin: 10.6 g/dL — ABNORMAL LOW (ref 12.0–16.0)

## 2018-07-29 LAB — BASIC METABOLIC PANEL
Anion gap: 11 (ref 5–15)
BUN: 27 mg/dL — AB (ref 8–23)
CALCIUM: 8.4 mg/dL — AB (ref 8.9–10.3)
CO2: 28 mmol/L (ref 22–32)
CREATININE: 1.31 mg/dL — AB (ref 0.61–1.24)
Chloride: 91 mmol/L — ABNORMAL LOW (ref 98–111)
GFR calc non Af Amer: 57 mL/min — ABNORMAL LOW (ref >60)
Glucose, Bld: 141 mg/dL — ABNORMAL HIGH (ref 70–99)
Potassium: 4.1 mmol/L (ref 3.5–5.1)
Sodium: 130 mmol/L — ABNORMAL LOW (ref 135–145)

## 2018-07-29 LAB — PROTIME-INR
INR: 1.74
Prothrombin Time: 20.2 seconds — ABNORMAL HIGH (ref 11.4–15.2)

## 2018-07-29 MED ORDER — ACETAMINOPHEN 325 MG PO TABS
650.0000 mg | ORAL_TABLET | Freq: Four times a day (QID) | ORAL | Status: DC | PRN
  Administered 2018-07-29 – 2018-07-31 (×3): 650 mg via ORAL
  Filled 2018-07-29 (×3): qty 2

## 2018-07-29 MED ORDER — WARFARIN SODIUM 5 MG PO TABS
5.0000 mg | ORAL_TABLET | Freq: Every evening | ORAL | Status: DC
  Administered 2018-07-29 – 2018-07-30 (×2): 5 mg via ORAL
  Filled 2018-07-29 (×2): qty 1

## 2018-07-29 MED ORDER — DIGOXIN 125 MCG PO TABS
0.1250 mg | ORAL_TABLET | Freq: Every day | ORAL | Status: DC
  Administered 2018-07-29 – 2018-07-31 (×3): 0.125 mg via ORAL
  Filled 2018-07-29 (×3): qty 1

## 2018-07-29 NOTE — Progress Notes (Signed)
CARDIAC REHAB PHASE I   Pt just back from walking with IV pole and his son. Sts he feels well. Discussed HF booklet and low sodium with pt and son. Good reception. Encouraged reading materials and watching HF video. He would like to continue to walk on his own today. Will f/u tomorrow to make sure no questions. [phone]  [place], ACSM 07/29/2018 11:40 AM

## 2018-07-29 NOTE — Progress Notes (Addendum)
[facility] [address]       [place] [address]             [phone]      11 Days Post-Op Procedure(s) (LRB): MITRAL VALVE (MV) REPLACEMENT WITH CARBOMEDICS OPTIFORM MITRAL VALVE SIZE 33. (N/A) MAZE (N/A) TRICUSPID VALVE REPAIR WITH EDWARDS MC3 TRICUSPID ANNULOPLASTY RING SIZE 30. (N/A) TRANSESOPHAGEAL ECHOCARDIOGRAM (TEE) (N/A)   Subjective:  Patient states feels alright.  He does have some shortness of breath. + ambulation  + BM  Objective: Vital signs in last 24 hours: Temp:  [97.6 F (36.4 C)-98.7 F (37.1 C)] 97.9 F (36.6 C) (09/03 0815) Pulse Rate:  [56-85] 85 (09/03 0815) Cardiac Rhythm: Atrial fibrillation (09/03 0714) Resp:  [11-39] 20 (09/03 0815) BP: (91-101)/(49-61) 93/50 (09/03 0815) SpO2:  [93 %-100 %] 100 % (09/03 0815) Weight:  [133.1 kg] 133.1 kg (09/03 0540)  Hemodynamic parameters for last 24 hours: CVP:  [5 mmHg-12 mmHg] 5 mmHg  Intake/Output from previous day: 09/02 0701 - 09/03 0700 In: 960 [P.O.:960] Out: 3330 [Urine:3330]  General appearance: alert, cooperative and no distress Heart: regular rate and rhythm Lungs: clear to auscultation bilaterally Abdomen: soft, non-tender; bowel sounds normal; no masses,  no organomegaly Extremities: edema 1-2 + pitting Wound: clean and dry  Lab Results: Recent Labs    07/28/18 0501 07/29/18 0500  WBC 18.6* 18.0*  HGB 10.7* 10.1*  HCT 33.7* 31.8*  PLT 519* 478*   BMET:  Recent Labs    07/28/18 0501 07/29/18 0500  NA 130* 130*  K 4.2 4.1  CL 91* 91*  CO2 29 28  GLUCOSE 153* 141*  BUN 28* 27*  CREATININE 1.18 1.31*  CALCIUM 8.4* 8.4*    PT/INR:  Recent Labs    07/29/18 0500  LABPROT 20.2*  INR 1.74   ABG    Component Value Date/Time   PHART 7.369 07/19/2018 0718   HCO3 20.9 07/19/2018 0718   TCO2 27 07/21/2018 1714   ACIDBASEDEF 4.0 (H) 07/19/2018 0718   O2SAT 58.3 07/29/2018 0500   CBG (last 3)  No results for input(s): GLUCAP in the last 72  hours.  Assessment/Plan: S/P Procedure(s) (LRB): MITRAL VALVE (MV) REPLACEMENT WITH CARBOMEDICS OPTIFORM MITRAL VALVE SIZE 33. (N/A) MAZE (N/A) TRICUSPID VALVE REPAIR WITH EDWARDS MC3 TRICUSPID ANNULOPLASTY RING SIZE 30. (N/A) TRANSESOPHAGEAL ECHOCARDIOGRAM (TEE) (N/A)  1. CV- A. Fib, rate controlled, Right sided heart failure, Hypotensive- continue Amiodarone, Entresto, AHF to see today 2. INR 1.74, will continue coumadin at 4 mg daily 3. Pulm- no acute issues, off oxygen, continue IS... CXR shows cardiomegaly, minimal left pleural effusion 4. Renal-creatinine at 1.13, small increase likley due to diuretic usage.. Remains hypervolemic on exam, continue Lasix drip, Spironolactone for now 5. ID- leukocytosis stable, continues to have no signs of infection 6. Dispo-patient stable, AHF to see today, likely ready for d/c soon, once heart failure medications are optimized   LOS: 11 days    Erin Barrett 07/29/2018   I have seen and examined the patient and agree with the assessment and plan as outlined.  Mr Benak remains quite stable.  CVP low and co-ox stable 58%.    I have personally reviewed his follow up ECHO which has not yet been officially read.  I think it looks okay except for significant RV dysfunction, as expected.  This causes septal flattening and likely significant impact on LV function as well.  Both valves look good.  Continue amiodarone, Entresto, low dose digoxin.  Increase Coumadin 5 mg/day.   Will consider stopping lasix drip and starting torsemide.  Consider adding sildenafil.  Looks about ready for hospital d/c but await input from Advanced Heart Failure team regarding meds for d/c and plans for follow up.    Rexene Alberts, MD 07/29/2018 9:16 AM

## 2018-07-29 NOTE — Progress Notes (Signed)
Orthopedic Tech Progress Note Patient Details:  Jeffrey Larson 07-Nov-1956 [id number]  Ortho Devices Type of Ortho Device: Haematologist Ortho Device/Splint Interventions: Application   Post Interventions Patient Tolerated: Well Instructions Provided: Care of device   [place] Pink 07/29/2018, 10:54 AM

## 2018-07-29 NOTE — Consult Note (Addendum)
Advanced Heart Failure Team Consult Note   Primary Physician: [name], DO PCP-Cardiologist:Kd Harwani Reason for Consultation: Heart Failure   HPI:    Jeffrey Larson is seen today for evaluation of right heart failure at the request of Dr Roxy Manns.    Jeffrey Larson is a 62 year old with history of obesity, COPD, chronic a fib, CAD, rheumatic mitral stenosis, TR, and chronic combined systolic/diastoic heart failure. In 2013 he suffered an acute myocardial infarction and was treated with multivessel PCI and stenting.   In June of this year he had sleep study with mild sleep apnea (AHI 11.5) and moderate desaturation with oxygen saturations down to 78%. CPAP was not recommended unless he was symptomatic.   Admitted in July with increased dyspnea. He underwent RHC/LHC. Dr Roxy Manns was consulted for severe mitral stenosis. Diuresed with IV lasix and later transitioned to lasix 40 mg three times a day. He was discharged to home with follow up at CT surgery.   On 8/23 he underwent scheduled MVR with mechanical MV, TVR using ring, and Maze. Post operatively he was placed on vanc + maxepime for possible HCAP. All drips gradually weaned off. Had CT placed on 8/28 for left pneumothorax.  CT removed on 8/31. Diuresed with lasix drip. Home entresto dose was restarted.  Weight has gone back down to preop weight but per Dr Roxy Manns he had significant volume overload on admit. CXR today with vascular congestion and edema.   Todays CO-OX is 58%. He continues to diurese with lasix drip 10 mg per hour.    Complaining of fatigue and increased leg edema. Able to walk a little more.   LHC [facility] 06/10/2018 PA  72/42(53) PWP 31   PVR 5.2 CO/CI 4.12/27/65   Prox RCA lesion is 10% stenosed.  Mid RCA lesion is 50% stenosed.  Post Atrio lesion is 30% stenosed.  Ost LAD to Prox LAD lesion is 20% stenosed.  Prox Cx lesion is 20% stenosed.  Hemodynamic findings consistent with severe pulmonary  hypertension.  LV end diastolic pressure is normal.  Severe MV calcification. MV area 0.84 cm2.  TEE 05/2018  LVEF 35-40%  Moderate TR, Severe mitral stenosis  ECHO 2018  EF 50-55%  MV - severely calcified.  Review of Systems: [y] = yes, [ ]  = no   General: Weight gain [ ] ; Weight loss [ ] ; Anorexia [ ] ; Fatigue [Y ]; Fever [ ] ; Chills [ ] ; Weakness [ ]   Cardiac: Chest pain/pressure [ ] ; Resting SOB [ ] ; Exertional SOB [Y ]; Orthopnea [ ] ; Pedal Edema [ Y]; Palpitations [ ] ; Syncope [ ] ; Presyncope [ ] ; Paroxysmal nocturnal dyspnea[ ]   Pulmonary: Cough [ ] ; Wheezing[ ] ; Hemoptysis[ ] ; Sputum [ ] ; Snoring [ ]   GI: Vomiting[ ] ; Dysphagia[ ] ; Melena[ ] ; Hematochezia [ ] ; Heartburn[ ] ; Abdominal pain [ ] ; Constipation [ ] ; Diarrhea [ ] ; BRBPR [ ]   GU: Hematuria[ ] ; Dysuria [ ] ; Nocturia[ ]   Vascular: Pain in legs with walking [ ] ; Pain in feet with lying flat [ ] ; Non-healing sores [ ] ; Stroke [ ] ; TIA [ ] ; Slurred speech [ ] ;  Neuro: Headaches[ ] ; Vertigo[ ] ; Seizures[ ] ; Paresthesias[ ] ;Blurred vision [ ] ; Diplopia [ ] ; Vision changes [ ]   Ortho/Skin: Arthritis [ ] ; Joint pain [ Y]; Muscle pain [ ] ; Joint swelling [ ] ; Back Pain [Y ]; Rash [ ]   Psych: Depression[ ] ; Anxiety[ ]   Heme: Bleeding problems [ ] ; Clotting disorders [ ] ; Anemia [ ]   Endocrine: Diabetes [ ] ; Thyroid dysfunction[ ]   Home Medications Prior to Admission medications   Medication Sig Start Date End Date Taking? Authorizing Provider  acetaminophen (TYLENOL) 500 MG tablet Take 1,000 mg by mouth 2 (two) times daily.   Yes [provider]  Albuterol Sulfate (PROAIR RESPICLICK) 195 (90 Base) MCG/ACT AEPB Inhale 1 puff into the lungs every 6 (six) hours as needed (sob). 08/02/17  Yes Hali Marry, MD  aspirin 81 MG tablet Take 81 mg by mouth daily.   Yes [provider]  atorvastatin (LIPITOR) 40 MG tablet Take 40 mg by mouth every evening.  09/29/17  Yes [provider]  digoxin (LANOXIN)  0.125 MG tablet Take 1 tablet (0.125 mg total) by mouth daily. 06/18/18  Yes [name], MD  enoxaparin (LOVENOX) 150 MG/ML injection Inject 0.9 mLs (135 mg total) into the skin daily for 8 days. 07/10/18 07/18/18 Yes Rexene Alberts, MD  Fluticasone-Umeclidin-Vilant (TRELEGY ELLIPTA) 100-62.5-25 MCG/INH AEPB Inhale 1 puff into the lungs daily. 01/27/18  Yes [name], DO  furosemide (LASIX) 40 MG tablet Take 40-80 mg by mouth See admin instructions. Take 80 mg in the morning and 40 mg in the evening   Yes [provider]  metoprolol succinate (TOPROL-XL) 50 MG 24 hr tablet Take 50 mg by mouth 2 (two) times daily. Take with or immediately following a meal.   Yes [provider]  nitroGLYCERIN (NITROSTAT) 0.4 MG SL tablet Place 1 tablet (0.4 mg total) under the tongue every 5 (five) minutes x 3 doses as needed for chest pain. 06/18/12 07/15/18 Yes [name], MD  Omega-3 Fatty Acids (FISH OIL) 1200 MG CAPS Take 1,200 mg by mouth daily.   Yes [provider]  oxymetazoline (AFRIN) 0.05 % nasal spray Place 1 spray into both nostrils daily.   Yes [provider]  potassium chloride SA (K-DUR,KLOR-CON) 20 MEQ tablet Take 20 mEq by mouth 2 (two) times daily.  09/16/16  Yes [provider]  sacubitril-valsartan (ENTRESTO) 24-26 MG Take 1 tablet by mouth 2 (two) times daily.   Yes [provider]  spironolactone (ALDACTONE) 25 MG tablet Take 1 tablet (25 mg total) by mouth daily. 08/12/14  Yes Hommel, Sean, DO  warfarin (COUMADIN) 4 MG tablet Take 4 mg by mouth every evening.    Yes [provider]  AMBULATORY NON FORMULARY MEDICATION Medication Name: Glucometer, lancets and strip to test every other day. Dx: Diabetes type 2, E11.9. 100 strip and 100 lancets 08/02/17   Hali Marry, MD    Past Medical History: Past Medical History:  Diagnosis Date  . Chronic combined systolic and diastolic congestive heart failure ([place])   .  COPD with chronic bronchitis ([place])   . Coronary artery disease   . Essential hypertension, benign    Ramipril to losartan Sept 2015   . History of pneumonia    RML on CXR 07/20/14  . Hyperlipidemia   . Longstanding persistent atrial fibrillation ([place])   . Mitral stenosis with regurgitation   . Myocardial infarction ([place])   . Pulmonary hypertension ([place])   . Renal disorder    history kidney stone  . S/P Maze operation for atrial fibrillation 07/18/2018   Complete bilateral atrial lesion set using bipolar radiofrequency and cryothermy ablation with clipping of LA appendage  . S/P mitral valve replacement with metallic valve 0/93/2671   Sorin [employer]omedics Optiform bileaflet mechanical valve, size 33 mm  . S/P TVR (tricuspid valve repair) 07/18/2018  Edwards mc3 ring annuloplasty, size 30 mm    Past Surgical History: Past Surgical History:  Procedure Laterality Date  . APPENDECTOMY    . CARDIOVASCULAR STRESS TEST  03/2014   Borderline reversible ischemic changes at the apex.  Normal LV contractility/EF 52%.  . CORONARY STENT PLACEMENT  7/12013  . LEFT HEART CATHETERIZATION WITH CORONARY ANGIOGRAM N/A 06/12/2012   Procedure: LEFT HEART CATHETERIZATION WITH CORONARY ANGIOGRAM;  Surgeon: Clent Demark, MD;  Location: [facility] CATH LAB;  Service: Cardiovascular;  Laterality: N/A;  . MAZE N/A 07/18/2018   Procedure: MAZE;  Surgeon: Rexene Alberts, MD;  Location: [place];  Service: Open Heart Surgery;  Laterality: N/A;  . MITRAL VALVE REPLACEMENT N/A 07/18/2018   Procedure: MITRAL VALVE (MV) REPLACEMENT WITH CARBOMEDICS OPTIFORM MITRAL VALVE SIZE 33.;  Surgeon: Rexene Alberts, MD;  Location: [place];  Service: Open Heart Surgery;  Laterality: N/A;  . PERCUTANEOUS CORONARY STENT INTERVENTION (PCI-S) N/A 06/17/2012   Procedure: PERCUTANEOUS CORONARY STENT INTERVENTION (PCI-S);  Surgeon: Clent Demark, MD;  Location: [facility] CATH LAB;  Service: Cardiovascular;  Laterality: N/A;  . RIGHT/LEFT HEART CATH AND  CORONARY ANGIOGRAPHY N/A 06/10/2018   Procedure: RIGHT/LEFT HEART CATH AND CORONARY ANGIOGRAPHY;  Surgeon: Dixie Dials, MD;  Location: [place] CV LAB;  Service: Cardiovascular;  Laterality: N/A;  . TEE WITHOUT CARDIOVERSION N/A 06/10/2018   Procedure: TRANSESOPHAGEAL ECHOCARDIOGRAM (TEE) with bubble study;  Surgeon: Dixie Dials, MD;  Location: [name] ENDOSCOPY;  Service: Cardiovascular;  Laterality: N/A;  . TEE WITHOUT CARDIOVERSION N/A 07/18/2018   Procedure: TRANSESOPHAGEAL ECHOCARDIOGRAM (TEE);  Surgeon: Rexene Alberts, MD;  Location: [place];  Service: Open Heart Surgery;  Laterality: N/A;  . TRICUSPID VALVE REPLACEMENT N/A 07/18/2018   Procedure: TRICUSPID VALVE REPAIR WITH EDWARDS MC3 TRICUSPID ANNULOPLASTY RING SIZE 30.;  Surgeon: Rexene Alberts, MD;  Location: [place];  Service: Open Heart Surgery;  Laterality: N/A;    Family History: Family History  Problem Relation Age of Onset  . Heart disease Mother   . Heart disease Father     Social History: Social History   Socioeconomic History  . Marital status: Married    Spouse name: Not on file  . Number of children: Not on file  . Years of education: Not on file  . Highest education level: Not on file  Occupational History  . Not on file  Social Needs  . Financial resource strain: Not on file  . Food insecurity:    Worry: Not on file    Inability: Not on file  . Transportation needs:    Medical: Not on file    Non-medical: Not on file  Tobacco Use  . Smoking status: Former Smoker    Packs/day: 2.00    Years: 15.00    Pack years: 30.00    Types: Cigarettes    Last attempt to quit: 11/26/1996    Years since quitting: 21.6  . Smokeless tobacco: Never Used  Substance and Sexual Activity  . Alcohol use: No  . Drug use: No  . Sexual activity: Yes  Lifestyle  . Physical activity:    Days per week: Not on file    Minutes per session: Not on file  . Stress: Not on file  Relationships  . Social connections:    Talks on  phone: Not on file    Gets together: Not on file    Attends religious service: Not on file    Active member of club or organization: Not on  file    Attends meetings of clubs or organizations: Not on file    Relationship status: Not on file  Other Topics Concern  . Not on file  Social History Narrative  . Not on file    Allergies:  Allergies  Allergen Reactions  . Ramipril Cough    cough    Objective:    Vital Signs:   Temp:  [97.6 F (36.4 C)-98.7 F (37.1 C)] 97.9 F (36.6 C) (09/03 0815) Pulse Rate:  [56-85] 85 (09/03 0815) Resp:  [11-39] 20 (09/03 0815) BP: (91-101)/(49-61) 93/50 (09/03 0815) SpO2:  [93 %-100 %] 100 % (09/03 0815) Weight:  [133.1 kg] 133.1 kg (09/03 0540) Last BM Date: 07/28/18  Weight change: Filed Weights   07/27/18 0505 07/28/18 0425 07/29/18 0540  Weight: 136 kg 134.6 kg 133.1 kg    Intake/Output:   Intake/Output Summary (Last 24 hours) at 07/29/2018 0834 Last data filed at 07/29/2018 0629 Gross per 24 hour  Intake 720 ml  Output 3330 ml  Net -2610 ml      Physical Exam   CVP 7-8  General:  No resp difficulty Sitting in the chair.  HEENT: normal anicteric Neck: supple. JVP 7-8 . Carotids 2+ bilat; no bruits. No lymphadenopathy or thyromegaly appreciated. Cor: PMI nondisplaced. Irregular Regular rate & rhythm. No rubs, gallops or murmurs. Lungs: Decreased in the bases on room air no wheeze Abdomen: obese soft, nontender, distended. No hepatosplenomegaly. No bruits or masses. Good bowel sounds. Extremities: no cyanosis, clubbing, rash, R and LLE 3+ edema. RUE PICC Neuro: alert & oriented x 3, cranial nerves grossly intact. moves all 4 extremities w/o difficulty. Affect pleasant   Telemetry    A Fib 60s Personally reviewed   EKG   07/23/18: AFIB 55 with bifascicular bloc (RBBB and LAFB)  Personally reviewed   Labs   Basic Metabolic Panel: Recent Labs  Lab 07/25/18 0420 07/26/18 0550 07/27/18 0322 07/28/18 0501  07/29/18 0500  NA 130* 130* 131* 130* 130*  K 3.4* 4.1 4.0 4.2 4.1  CL 85* 87* 90* 91* 91*  CO2 36* 35* 32 29 28  GLUCOSE 145* 130* 106* 153* 141*  BUN 25* 23 26* 28* 27*  CREATININE 1.04 1.07 1.12 1.18 1.31*  CALCIUM 8.1* 8.5* 8.4* 8.4* 8.4*  MG  --  2.1  --   --   --     Liver Function Tests: Recent Labs  Lab 07/24/18 0435 07/28/18 0501  AST 29 33  ALT 22 34  ALKPHOS 96 198*  BILITOT 0.9 1.4*  PROT 5.6* 6.0*  ALBUMIN 2.3* 2.5*   No results for input(s): LIPASE, AMYLASE in the last 168 hours. No results for input(s): AMMONIA in the last 168 hours.  CBC: Recent Labs  Lab 07/23/18 0410 07/25/18 0420 07/28/18 0501 07/29/18 0500  WBC 10.1 11.9* 18.6* 18.0*  HGB 10.1* 10.4* 10.7* 10.1*  HCT 30.9* 32.4* 33.7* 31.8*  MCV 87.0 88.3 88.5 88.6  PLT 197 291 519* 478*    Cardiac Enzymes: No results for input(s): CKTOTAL, CKMB, CKMBINDEX, TROPONINI in the last 168 hours.  BNP: BNP (last 3 results) Recent Labs    02/28/18 1643 03/11/18 0850 07/28/18 0501  BNP 457* 406* 159.7*    ProBNP (last 3 results) No results for input(s): PROBNP in the last 8760 hours.   CBG: Recent Labs  Lab 07/24/18 1616 07/24/18 2129 07/25/18 0702 07/25/18 1156 07/25/18 1633  GLUCAP 87 147* 102* 101* 114*    Coagulation Studies: Recent Labs  07/27/18 0322 07/28/18 0501 07/29/18 0500  LABPROT 19.2* 18.8* 20.2*  INR 1.64 1.59 1.74     Imaging   Dg Chest 2 View  Result Date: 07/29/2018 CLINICAL DATA:  Congestive heart failure. History of mitral and tricuspid valve replacement. COPD. EXAM: CHEST - 2 VIEW COMPARISON:  PA and lateral chest x-ray of July 27, 2018 FINDINGS: The lungs are adequately inflated. No pneumothorax is evident. The interstitial markings are mildly prominent. Confluent density persists peripherally in the left mid lung. There is a small right and trace left pleural effusion which are stable. The cardiac silhouette remains enlarged. The left atrial  appendage clip and the prosthetic mitral and tricuspid valve rings are stable in appearance. There are post median sternotomy changes. The right-sided PICC line tip projects over the midportion of the SVC. IMPRESSION: Fairly stable appearance of the chest. Cardiomegaly with mild pulmonary vascular congestion and interstitial edema. Atelectasis, infiltrate, or scarring persist peripherally in the left mid lung. There is a small right and trace left pleural effusion which are stable. No recurrent left-sided pneumothorax. Electronically Signed   By: David  Martinique M.D.   On: 07/29/2018 07:10      Medications:     Current Medications: . amiodarone  200 mg Oral BID WC  . aspirin EC  81 mg Oral Daily  . atorvastatin  40 mg Oral QPM  . bisacodyl  10 mg Oral Daily   Or  . bisacodyl  10 mg Rectal Daily  . Chlorhexidine Gluconate Cloth  6 each Topical Daily  . docusate sodium  200 mg Oral Daily  . enoxaparin (LOVENOX) injection  40 mg Subcutaneous Q24H  . mouth rinse  15 mL Mouth Rinse q12n4p  . pantoprazole  40 mg Oral Daily  . potassium chloride  40 mEq Oral TID WC  . sacubitril-valsartan  1 tablet Oral BID  . sodium chloride flush  10-40 mL Intracatheter Q12H  . spironolactone  25 mg Oral Daily  . warfarin  4 mg Oral QPM  . Warfarin - Physician Dosing Inpatient   Does not apply q1800     Infusions: . sodium chloride    . furosemide (LASIX) infusion 10 mg/hr (07/27/18 1423)  . lactated ringers Stopped (07/20/18 1027)       Patient Profile   Jeffrey Larson is a 62 year old with history of obesity, COPD, chronic a fib, CAD, and chronic combined systolic/diastoic heart failure.   Admitted for scheduled MVR and TVR.   Assessment/Plan   1. Severe Mitral Stenosis --> S/P Mitral Valve Replacement on 8/23  2. Moderated TR ---> S/P TVR with ring on 8/23   3. Pneumothorax S/P L chest placed 8/28. CT removed 8/31  4. A/C Combined Systolic/Diastolic HF---> R >L ECHO pending.  CVP 7-8.  Volume status elevated with significant lower extremity edema. . Continue lasix drip @ 10 mg per hour.  Brisk diuresis noted. Once diuresed will need to place on torsemide.  No bb for now. Continue entresto 24-26 mg twice daily. He was on this prior to admit. SBP soft may need to stop.  Continue spironolactone 25 mg daily Follow renal function closely. Creatinine trending up 1.1>1.3  Add unna boots.   5. Chronic A fib  Rate controlled. On amio 200 mg twice a day.  On coumadin. INR 1.7   6. CAD No S/S ischemia. On statin/asa.  LHC 06/10/2018  Prox RCA lesion is 10% stenosed.  Mid RCA lesion is 50% stenosed.  Ost LAD to Prox  LAD lesion is 20% stenosed.  Prox Cx lesion is 20% stenosed.  7. Morbid Obesity Body mass index is 40.93 kg/m.  Needs to lose weight.   8. Pulmonary HTN Elevated PVR on RHC 05/2018  ECHO pending.  Had sleep study 04/2018. Mild sleep apnea with desaturations down to 78%. He does not use oxygen at night.    Medication concerns reviewed with patient and pharmacy team. Barriers identified: none   Length of Stay: [place], NP  07/29/2018, 8:34 AM  Advanced Heart Failure Team Pager [phone] (M-F; [place])  Please contact [facility] for night-coverage after hours (4p -7a ) and weekends on [email or web address]   Patient seen and examined with Darrick Grinder, NP. We discussed all aspects of the encounter. I agree with the assessment and plan as stated above.   62 y/o male with obesity, OSA, rheumatic mitral stenosis with pulmonary HTN, chronic AF and RV failure. Now s/p MVR and TV ring and Maze. Overall doing well post-op but remains markedly volume overloaded. Co-ox stable at 58% Creatinine 1.2-> 1.3. Echo reviewed personally today. LVEF 50-55% RV markedly dilated with severe HK. Agree with continuing IV diuresis as BP and renal function tolerate. Place UNNA boots. Consider digoxin and sildenafil as HR and BP tolerates. Will need CPAP at home.   Glori Bickers, MD   1:51 PM

## 2018-07-30 DIAGNOSIS — L8990 Pressure ulcer of unspecified site, unspecified stage: Secondary | ICD-10-CM

## 2018-07-30 LAB — PROTIME-INR
INR: 1.76
Prothrombin Time: 20.4 seconds — ABNORMAL HIGH (ref 11.4–15.2)

## 2018-07-30 LAB — COOXEMETRY PANEL
Carboxyhemoglobin: 1.4 % (ref 0.5–1.5)
METHEMOGLOBIN: 1.4 % (ref 0.0–1.5)
O2 Saturation: 59.1 %
Total hemoglobin: 11.1 g/dL — ABNORMAL LOW (ref 12.0–16.0)

## 2018-07-30 LAB — BASIC METABOLIC PANEL
ANION GAP: 10 (ref 5–15)
BUN: 27 mg/dL — ABNORMAL HIGH (ref 8–23)
CALCIUM: 9.1 mg/dL (ref 8.9–10.3)
CO2: 27 mmol/L (ref 22–32)
Chloride: 94 mmol/L — ABNORMAL LOW (ref 98–111)
Creatinine, Ser: 1.35 mg/dL — ABNORMAL HIGH (ref 0.61–1.24)
GFR, EST NON AFRICAN AMERICAN: 55 mL/min — AB (ref >60)
Glucose, Bld: 146 mg/dL — ABNORMAL HIGH (ref 70–99)
Potassium: 4.1 mmol/L (ref 3.5–5.1)
SODIUM: 131 mmol/L — AB (ref 135–145)

## 2018-07-30 LAB — MAGNESIUM: Magnesium: 2.5 mg/dL — ABNORMAL HIGH (ref 1.7–2.4)

## 2018-07-30 MED ORDER — TORSEMIDE 20 MG PO TABS
60.0000 mg | ORAL_TABLET | Freq: Every day | ORAL | Status: DC
  Administered 2018-07-30 – 2018-07-31 (×2): 60 mg via ORAL
  Filled 2018-07-30 (×2): qty 3

## 2018-07-30 MED ORDER — INFLUENZA VAC SPLIT QUAD 0.5 ML IM SUSY
0.5000 mL | PREFILLED_SYRINGE | INTRAMUSCULAR | Status: AC
  Administered 2018-07-31: 0.5 mL via INTRAMUSCULAR
  Filled 2018-07-30: qty 0.5

## 2018-07-30 NOTE — Progress Notes (Signed)
Physical Therapy Discharge Patient Details Name: Jeffrey Larson MRN: [id number] DOB: [birth year] Today's Date: 07/30/2018 Time:  -     Patient discharged from PT services secondary to goals met and no further PT needs identified.  Please see latest therapy progress note for current level of functioning and progress toward goals.  Pt independent with all mobility.  Progress and discharge plan discussed with patient and/or caregiver: Patient/Caregiver agrees with plan  GP     Shary Decamp [facility] 07/30/2018, 11:43 AM   [place] Pager [phone] Office [phone]

## 2018-07-30 NOTE — Progress Notes (Addendum)
      [address]       [place] [address]             [phone]      12 Days Post-Op Procedure(s) (LRB): MITRAL VALVE (MV) REPLACEMENT WITH CARBOMEDICS OPTIFORM MITRAL VALVE SIZE 33. (N/A) MAZE (N/A) TRICUSPID VALVE REPAIR WITH EDWARDS MC3 TRICUSPID ANNULOPLASTY RING SIZE 30. (N/A) TRANSESOPHAGEAL ECHOCARDIOGRAM (TEE) (N/A)   Subjective:  No new complaints.  He feels like his legs are a little less swollen today.  Objective: Vital signs in last 24 hours: Temp:  [97.9 F (36.6 C)-98.5 F (36.9 C)] 97.9 F (36.6 C) (09/04 0322) Pulse Rate:  [57-67] 57 (09/04 0049) Cardiac Rhythm: Atrial fibrillation (09/03 2200) Resp:  [16-25] 25 (09/04 0322) BP: (93-119)/(40-61) 105/61 (09/04 0322) SpO2:  [93 %-95 %] 93 % (09/04 0322) Weight:  [132 kg] 132 kg (09/04 0322)  Hemodynamic parameters for last 24 hours: CVP:  [3 mmHg-10 mmHg] 7 mmHg  Intake/Output from previous day: 09/03 0701 - 09/04 0700 In: 1490 [P.O.:960; I.V.:530] Out: 2560 [Urine:2560] Intake/Output this shift: Total I/O In: -  Out: 720 [Urine:720]  General appearance: alert, cooperative and no distress Heart: irregularly irregular rhythm Lungs: clear to auscultation bilaterally Abdomen: soft, non-tender; bowel sounds normal; no masses,  no organomegaly Extremities: edema + Pitting edema, BLE wrapped in compression dressing Wound: clean and dry  Lab Results: Recent Labs    07/28/18 0501 07/29/18 0500  WBC 18.6* 18.0*  HGB 10.7* 10.1*  HCT 33.7* 31.8*  PLT 519* 478*   BMET:  Recent Labs    07/28/18 0501 07/29/18 0500  NA 130* 130*  K 4.2 4.1  CL 91* 91*  CO2 29 28  GLUCOSE 153* 141*  BUN 28* 27*  CREATININE 1.18 1.31*  CALCIUM 8.4* 8.4*    PT/INR:  Recent Labs    07/30/18 0517  LABPROT 20.4*  INR 1.76   ABG    Component Value Date/Time   PHART 7.369 07/19/2018 0718   HCO3 20.9 07/19/2018 0718   TCO2 27 07/21/2018 1714   ACIDBASEDEF 4.0 (H) 07/19/2018 0718   O2SAT 59.1 07/30/2018 0559   CBG (last 3)  No results for input(s): GLUCAP in the last 72 hours.  Assessment/Plan: S/P Procedure(s) (LRB): MITRAL VALVE (MV) REPLACEMENT WITH CARBOMEDICS OPTIFORM MITRAL VALVE SIZE 33. (N/A) MAZE (N/A) TRICUSPID VALVE REPAIR WITH EDWARDS MC3 TRICUSPID ANNULOPLASTY RING SIZE 30. (N/A) TRANSESOPHAGEAL ECHOCARDIOGRAM (TEE) (N/A)  1. CV- rate controlled A. Fib rate controlled, right sided heart failure- AHF following, continue Amiodarone, Entresto, Digoxin 2. INR 1.76, will repeat 5 mg tonight, if no rise will increase to 7.5 mg daily 3. Pulm- no acute issues, off oxygen, continue IS 4. Renal- creatinine, remains stable, on lasix gtt, spironolactone- diuretics per AHF, will repeat BMET in AM 5. Dispo- patient stable, appreciate AHF input, remains on Lasix gtt, continue titration of AHF medications as tolerated, for discharge once medication regimen optimized   LOS: 12 days    Erin Barrett 07/30/2018  I have seen and examined the patient and agree with the assessment and plan as outlined.  Possibly ready for d/c soon.  Continue Coumadin 5 mg/day for now.  Goal INR 2.5-3.5  Rexene Alberts, MD 07/30/2018 9:11 AM

## 2018-07-30 NOTE — Progress Notes (Addendum)
Advanced Heart Failure Rounding Note  PCP-Cardiologist: No primary care provider on file.   Subjective:    Diuresing with lasix drip 10 mg per hour. Negative 1 liter. Weight down another 2 pounds.   Feeling much better. Denies SOB. Abdomen less distended. Less LE edema. No orthopnea or PND.    Objective:   Weight Range: 132 kg Body mass index is 40.59 kg/m.   Vital Signs:   Temp:  [97.9 F (36.6 C)-98.5 F (36.9 C)] 97.9 F (36.6 C) (09/04 0322) Pulse Rate:  [57-67] 57 (09/04 0049) Resp:  [16-25] 25 (09/04 0322) BP: (93-119)/(40-61) 105/61 (09/04 0322) SpO2:  [93 %-95 %] 93 % (09/04 0322) Weight:  [132 kg] 132 kg (09/04 0322) Last BM Date: 07/29/18  Weight change: Filed Weights   07/28/18 0425 07/29/18 0540 07/30/18 0322  Weight: 134.6 kg 133.1 kg 132 kg    Intake/Output:   Intake/Output Summary (Last 24 hours) at 07/30/2018 0822 Last data filed at 07/30/2018 0754 Gross per 24 hour  Intake 1250 ml  Output 2980 ml  Net -1730 ml      Physical Exam   CVP 3-4  personally checked.  General:  Lying flat. No resp difficulty HEENT: Normal anicteric Neck: Supple. JVP flat  . Carotids 2+ bilat; no bruits. No lymphadenopathy or thyromegaly appreciated. Cor: PMI nondisplaced. Irregular  rate & rhythm. No rubs, gallops or murmurs. Mechanical s1 Lungs: Clear Abdomen: Obese Soft, nontender, nondistended. No hepatosplenomegaly. No bruits or masses. Good bowel sounds. Extremities: No cyanosis, clubbing, rash, R and LLE unna boots. RUE PICC Neuro: alert & oriented x 3, cranial nerves grossly intact. moves all 4 extremities w/o difficulty. Affect pleasant   Telemetry   A fib 50-60s   EKG    N/A   Labs    CBC Recent Labs    07/28/18 0501 07/29/18 0500  WBC 18.6* 18.0*  HGB 10.7* 10.1*  HCT 33.7* 31.8*  MCV 88.5 88.6  PLT 519* 220*   Basic Metabolic Panel Recent Labs    07/28/18 0501 07/29/18 0500 07/30/18 0517  NA 130* 130*  --   K 4.2 4.1  --   CL  91* 91*  --   CO2 29 28  --   GLUCOSE 153* 141*  --   BUN 28* 27*  --   CREATININE 1.18 1.31*  --   CALCIUM 8.4* 8.4*  --   MG  --   --  2.5*   Liver Function Tests Recent Labs    07/28/18 0501  AST 33  ALT 34  ALKPHOS 198*  BILITOT 1.4*  PROT 6.0*  ALBUMIN 2.5*   No results for input(s): LIPASE, AMYLASE in the last 72 hours. Cardiac Enzymes No results for input(s): CKTOTAL, CKMB, CKMBINDEX, TROPONINI in the last 72 hours.  BNP: BNP (last 3 results) Recent Labs    02/28/18 1643 03/11/18 0850 07/28/18 0501  BNP 457* 406* 159.7*    ProBNP (last 3 results) No results for input(s): PROBNP in the last 8760 hours.   D-Dimer No results for input(s): DDIMER in the last 72 hours. Hemoglobin A1C No results for input(s): HGBA1C in the last 72 hours. Fasting Lipid Panel No results for input(s): CHOL, HDL, LDLCALC, TRIG, CHOLHDL, LDLDIRECT in the last 72 hours. Thyroid Function Tests No results for input(s): TSH, T4TOTAL, T3FREE, THYROIDAB in the last 72 hours.  Invalid input(s): FREET3  Other results:   Imaging     No results found.   Medications:  Scheduled Medications: . amiodarone  200 mg Oral BID WC  . aspirin EC  81 mg Oral Daily  . atorvastatin  40 mg Oral QPM  . bisacodyl  10 mg Oral Daily   Or  . bisacodyl  10 mg Rectal Daily  . Chlorhexidine Gluconate Cloth  6 each Topical Daily  . digoxin  0.125 mg Oral Daily  . docusate sodium  200 mg Oral Daily  . enoxaparin (LOVENOX) injection  40 mg Subcutaneous Q24H  . mouth rinse  15 mL Mouth Rinse q12n4p  . pantoprazole  40 mg Oral Daily  . potassium chloride  40 mEq Oral TID WC  . sacubitril-valsartan  1 tablet Oral BID  . sodium chloride flush  10-40 mL Intracatheter Q12H  . spironolactone  25 mg Oral Daily  . warfarin  5 mg Oral QPM  . Warfarin - Physician Dosing Inpatient   Does not apply q1800     Infusions: . sodium chloride    . furosemide (LASIX) infusion 10 mg/hr (07/29/18 1928)  .  lactated ringers Stopped (07/20/18 1027)     PRN Medications:  acetaminophen, ipratropium-albuterol, metoprolol tartrate, morphine injection, ondansetron (ZOFRAN) IV, oxyCODONE, sodium chloride flush, traMADol, zolpidem    Patient Profile   Jeffrey Larson is a 62 year old with history of obesity, COPD, chronic a fib, CAD, and chronic combined systolic/diastoic heart failure.   Admitted for scheduled MVR and TVR.   Assessment/Plan  1. Severe Mitral Stenosis --> S/P Mitral Valve Replacement on 8/23  2. Moderated TR ---> S/P TVR with ring on 8/23   3. Pneumothorax S/P L chest placed 8/28. CT removed 8/31  4. A/C Combined Systolic/Diastolic HF---> R >L ECHO LVEF 50-55% RV marked dilitation.  CVP down to CVP 3-4.  - stop lasix drip. Check BMET now.  - Start torsemide 60 mg daily after we see BMET results.  No bb for now. No room for digoxin with bradycardia.  Continue entresto 24-26 mg twice daily. He was on this prior to admit. SBP soft may need to stop.  Continue spironolactone 25 mg daily Follow renal function closely. Creatinine trending up 1.1>1.3 >pending  Continue unna boots. Leg edema much improved.   5. Chronic A fib  Rate controlled. On amio 200 mg twice a day.  On coumadin. INR 1.7   6. CAD No s/s ischemia.  On statin/asa.  LHC 06/10/2018  Prox RCA lesion is 10% stenosed.  Mid RCA lesion is 50% stenosed.  Ost LAD to Prox LAD lesion is 20% stenosed.  Prox Cx lesion is 20% stenosed.  7. Morbid Obesity Body mass index is 40.93 kg/m.  Needs to lose weight.   8. Pulmonary HTN Elevated PVR on RHC 05/2018  Per Dr Haroldine Laws --> ECHO LVEF 50-55% RV markedly dilated with severe HK.  Had sleep study 04/2018. Mild sleep apnea with desaturations down to 78%. He does not use oxygen at night.  Check O2 saturations with ambulation.  He will need follow up with Dr Elsworth Soho for CPAP.    Discussed low salt food choices and limiting fluid intake to < 2 liters per day.     Medication concerns reviewed with patient and pharmacy team. Barriers identified: none   Length of Stay: [place], NP  07/30/2018, 8:22 AM  Advanced Heart Failure Team Pager [phone] (M-F; [place])  Please contact [facility] for night-coverage after hours (4p -7a ) and weekends on [email or web address]   BMET stable. Start torsemide 60 mg daily.  Should be able to d/c in am.   Darrick Grinder NP-C  10:42 AM  Patient seen and examined with Darrick Grinder, NP. We discussed all aspects of the encounter. I agree with the assessment and plan as stated above.   He is doing very well. Volume status looks great. Co-ox stable. Agree with switching to po torsemide. Continue to ambulate. Agree with probable d/c in am with close f/u in HF Clinic.   Glori Bickers, MD  11:09 PM

## 2018-07-30 NOTE — Progress Notes (Signed)
patient ambulated three times in the hall on this shift on room air, ambulation well tolerated will monitor.

## 2018-07-30 NOTE — Plan of Care (Signed)
  Problem: Education: Goal: Knowledge of General Education information will improve Description Including pain rating scale, medication(s)/side effects and non-pharmacologic comfort measures Outcome: Progressing   Problem: Health Behavior/Discharge Planning: Goal: Ability to manage health-related needs will improve Outcome: Progressing   Problem: Clinical Measurements: Goal: Will remain free from infection Outcome: Progressing Goal: Respiratory complications will improve Outcome: Progressing   Problem: Nutrition: Goal: Adequate nutrition will be maintained Outcome: Progressing   Problem: Pain Managment: Goal: General experience of comfort will improve Outcome: Progressing   Problem: Skin Integrity: Goal: Risk for impaired skin integrity will decrease Outcome: Progressing   Problem: Elimination: Goal: Will not experience complications related to bowel motility Outcome: Completed/Met Goal: Will not experience complications related to urinary retention Outcome: Completed/Met

## 2018-07-30 NOTE — Progress Notes (Signed)
CARDIAC REHAB PHASE I   PRE:  Rate/Rhythm: 58 afib    BP: sitting 109/60    SaO2: 96 RA  MODE:  Ambulation: 750 ft   POST:  Rate/Rhythm: 80 afib    BP: sitting 102/73     SaO2: 95 RA  Pt feeling well. Able to walk independently. SaO2 maintained mid 90s, mostly 95 RA. No c/o except some congestion. He is practicing his IS. He sts he did not wear O2 last night. Asked him and RN to monitor SAO2 while asleep tonight. However pt sts he is not actually sleeping much. Reviewed ed, no questions.  [id number]   [place], ACSM 07/30/2018 10:24 AM

## 2018-07-31 LAB — CBC
HEMATOCRIT: 33.9 % — AB (ref 39.0–52.0)
Hemoglobin: 10.7 g/dL — ABNORMAL LOW (ref 13.0–17.0)
MCH: 28.1 pg (ref 26.0–34.0)
MCHC: 31.6 g/dL (ref 30.0–36.0)
MCV: 89 fL (ref 78.0–100.0)
Platelets: 462 10*3/uL — ABNORMAL HIGH (ref 150–400)
RBC: 3.81 MIL/uL — AB (ref 4.22–5.81)
RDW: 16.7 % — ABNORMAL HIGH (ref 11.5–15.5)
WBC: 15.9 10*3/uL — AB (ref 4.0–10.5)

## 2018-07-31 LAB — BASIC METABOLIC PANEL
ANION GAP: 9 (ref 5–15)
BUN: 23 mg/dL (ref 8–23)
CHLORIDE: 99 mmol/L (ref 98–111)
CO2: 25 mmol/L (ref 22–32)
Calcium: 8.6 mg/dL — ABNORMAL LOW (ref 8.9–10.3)
Creatinine, Ser: 1.21 mg/dL (ref 0.61–1.24)
Glucose, Bld: 102 mg/dL — ABNORMAL HIGH (ref 70–99)
POTASSIUM: 4.7 mmol/L (ref 3.5–5.1)
SODIUM: 133 mmol/L — AB (ref 135–145)

## 2018-07-31 LAB — PROTIME-INR
INR: 1.75
Prothrombin Time: 20.3 seconds — ABNORMAL HIGH (ref 11.4–15.2)

## 2018-07-31 MED ORDER — TORSEMIDE 20 MG PO TABS: 60.0000 mg | ORAL_TABLET | Freq: Every day | ORAL | 3 refills | Status: DC

## 2018-07-31 MED ORDER — METOPROLOL SUCCINATE ER 50 MG PO TB24: 50.0000 mg | ORAL_TABLET | Freq: Every day | ORAL | 3 refills | Status: DC

## 2018-07-31 MED ORDER — WARFARIN SODIUM 6 MG PO TABS: 6.0000 mg | ORAL_TABLET | Freq: Every evening | ORAL | 1 refills | Status: DC

## 2018-07-31 MED ORDER — TRAMADOL HCL 50 MG PO TABS: 50.0000 mg | ORAL_TABLET | Freq: Four times a day (QID) | ORAL | 0 refills | Status: DC | PRN

## 2018-07-31 MED ORDER — METOPROLOL SUCCINATE ER 50 MG PO TB24
50.0000 mg | ORAL_TABLET | Freq: Every day | ORAL | Status: DC
  Administered 2018-07-31: 50 mg via ORAL
  Filled 2018-07-31: qty 1

## 2018-07-31 MED ORDER — POTASSIUM CHLORIDE CRYS ER 20 MEQ PO TBCR: 40.0000 meq | EXTENDED_RELEASE_TABLET | Freq: Every day | ORAL | Status: DC

## 2018-07-31 NOTE — Progress Notes (Addendum)
Advanced Heart Failure Rounding Note  PCP-Cardiologist: No primary care provider on file.   Subjective:    Yesterday lasix drip stopped and torsemide was started. Weight down another 5 pounds.   Feels much better. Wants to go home.    Objective:   Weight Range: 129.8 kg Body mass index is 39.91 kg/m.   Vital Signs:   Temp:  [97.3 F (36.3 C)-98.3 F (36.8 C)] 98.1 F (36.7 C) (09/05 0749) Pulse Rate:  [61-157] 68 (09/05 0749) Resp:  [16-21] 20 (09/05 0749) BP: (96-110)/(53-75) 105/70 (09/05 0749) SpO2:  [94 %-100 %] 95 % (09/05 0749) Weight:  [129.8 kg] 129.8 kg (09/05 0529) Last BM Date: 07/30/18  Weight change: Filed Weights   07/29/18 0540 07/30/18 0322 07/31/18 0529  Weight: 133.1 kg 132 kg 129.8 kg    Intake/Output:   Intake/Output Summary (Last 24 hours) at 07/31/2018 0810 Last data filed at 07/31/2018 0747 Gross per 24 hour  Intake 880 ml  Output 1905 ml  Net -1025 ml      Physical Exam  General:  Well appearing. No resp difficulty HEENT: normal Neck: supple. JVP 5-6 . Carotids 2+ bilat; no bruits. No lymphadenopathy or thryomegaly appreciated. Cor: PMI nondisplaced. Irregular  rate & rhythm. No rubs, gallops or murmurs. Sternal incision approximated.  Lungs: clear Abdomen: soft, nontender, nondistended. No hepatosplenomegaly. No bruits or masses. Good bowel sounds. Extremities: no cyanosis, clubbing, rash, R and LLE unna boots. RUE PICC  Neuro: alert & orientedx3, cranial nerves grossly intact. moves all 4 extremities w/o difficulty. Affect pleasant      Telemetry   A fib 50-60s personally reviewed.   EKG    N/A   Labs    CBC Recent Labs    07/29/18 0500 07/31/18 0403  WBC 18.0* 15.9*  HGB 10.1* 10.7*  HCT 31.8* 33.9*  MCV 88.6 89.0  PLT 478* 751*   Basic Metabolic Panel Recent Labs    07/30/18 0517 07/30/18 0900 07/31/18 0403  NA  --  131* 133*  K  --  4.1 4.7  CL  --  94* 99  CO2  --  27 25  GLUCOSE  --  146* 102*    BUN  --  27* 23  CREATININE  --  1.35* 1.21  CALCIUM  --  9.1 8.6*  MG 2.5*  --   --    Liver Function Tests No results for input(s): AST, ALT, ALKPHOS, BILITOT, PROT, ALBUMIN in the last 72 hours. No results for input(s): LIPASE, AMYLASE in the last 72 hours. Cardiac Enzymes No results for input(s): CKTOTAL, CKMB, CKMBINDEX, TROPONINI in the last 72 hours.  BNP: BNP (last 3 results) Recent Labs    02/28/18 1643 03/11/18 0850 07/28/18 0501  BNP 457* 406* 159.7*    ProBNP (last 3 results) No results for input(s): PROBNP in the last 8760 hours.   D-Dimer No results for input(s): DDIMER in the last 72 hours. Hemoglobin A1C No results for input(s): HGBA1C in the last 72 hours. Fasting Lipid Panel No results for input(s): CHOL, HDL, LDLCALC, TRIG, CHOLHDL, LDLDIRECT in the last 72 hours. Thyroid Function Tests No results for input(s): TSH, T4TOTAL, T3FREE, THYROIDAB in the last 72 hours.  Invalid input(s): FREET3  Other results:   Imaging    No results found.   Medications:     Scheduled Medications: . amiodarone  200 mg Oral BID WC  . aspirin EC  81 mg Oral Daily  . atorvastatin  40 mg  Oral QPM  . bisacodyl  10 mg Oral Daily   Or  . bisacodyl  10 mg Rectal Daily  . Chlorhexidine Gluconate Cloth  6 each Topical Daily  . digoxin  0.125 mg Oral Daily  . docusate sodium  200 mg Oral Daily  . enoxaparin (LOVENOX) injection  40 mg Subcutaneous Q24H  . Influenza vac split quadrivalent PF  0.5 mL Intramuscular Tomorrow-1000  . mouth rinse  15 mL Mouth Rinse q12n4p  . pantoprazole  40 mg Oral Daily  . potassium chloride  40 mEq Oral TID WC  . sacubitril-valsartan  1 tablet Oral BID  . sodium chloride flush  10-40 mL Intracatheter Q12H  . spironolactone  25 mg Oral Daily  . torsemide  60 mg Oral Daily  . warfarin  5 mg Oral QPM  . Warfarin - Physician Dosing Inpatient   Does not apply q1800    Infusions: . sodium chloride    . lactated ringers Stopped  (07/20/18 1027)    PRN Medications: acetaminophen, ipratropium-albuterol, metoprolol tartrate, morphine injection, ondansetron (ZOFRAN) IV, oxyCODONE, sodium chloride flush, traMADol, zolpidem    Patient Profile   Jeffrey Larson is a 62 year old with history of obesity, COPD, chronic a fib, CAD, and chronic combined systolic/diastoic heart failure.   Admitted for scheduled MVR and TVR.   Assessment/Plan  1. Severe Mitral Stenosis --> S/P Mitral Valve Replacement on 8/23  2. Moderated TR ---> S/P TVR with ring on 8/23   3. Pneumothorax S/P L chest placed 8/28. CT removed 8/31  4. A/C Combined Systolic/Diastolic HF---> R >L ECHO LVEF 50-55% RV marked dilitation.  Volume status stable. Continue torsemide 60 mg daily.  - Stop amio and starting Toprol xl 50 mg daily. He was on Toprol XL 50 mg twice a day prior to admit.  - Continue dig 0.125 mg daily for RV failure.   Continue entresto 24-26 mg twice daily. He was on this prior to admit. SBP soft may need to stop.  Continue spironolactone 25 mg daily Follow renal function closely. Creatinine stable.  - He can remove unna boots. I have asked him to use compression socks at home. He has some at home.  5. Chronic A fib On amio for rate control only. Stop amio. Prior to admit he was taking Toprol XL 50 mg twice a day.    On coumadin. INR INR 1.75  6. CAD No s/s ischemia.  On statin/asa.  LHC 06/10/2018  Prox RCA lesion is 10% stenosed.  Mid RCA lesion is 50% stenosed.  Ost LAD to Prox LAD lesion is 20% stenosed.  Prox Cx lesion is 20% stenosed.  7. Morbid Obesity Body mass index is 39.91 kg/m. Needs to lose weight.   8. Pulmonary HTN Elevated PVR on RHC 05/2018  Per Dr Haroldine Laws --> ECHO LVEF 50-55% RV markedly dilated with severe HK.  Had sleep study 04/2018. Mild sleep apnea with desaturations down to 78%. He does not use oxygen at night.  Sats >90s with ambulation.  He will need follow up with Dr Elsworth Soho for CPAP.      Torsemide 60 mg daily K dur 40 meq daily  Toprol XL 50 mg daily  Spironolactone 25 mg daily  Digoxin 0.125 mg daily  Spironolactone 24-36 mg twice a day    HF follow up has been set.   Medication concerns reviewed with patient and pharmacy team. Barriers identified: none   Length of Stay: [place], NP  07/31/2018, 8:10  AM  Advanced Heart Failure Team Pager [phone] (M-F; 7a - 4p)  Please contact [facility] for night-coverage after hours (4p -7a ) and weekends on [email or web address]  Patient seen and examined with Darrick Grinder, NP. We discussed all aspects of the encounter. I agree with the assessment and plan as stated above.   Volume status looks great. BP and renal function stable. Agree with d/c home today on above medications. We will arrange f/u in HF Clinic. We will sign off.  Glori Bickers, MD  12:11 PM

## 2018-07-31 NOTE — Care Management Note (Addendum)
Case Management Note Previous CM note completed by Midge Minium RN, BSN, NCM-BC, ACM-RN [phone] 07/22/2018, 12:15 PM  Patient Details  Name: XZAIVER VAYDA MRN: [id number] Date of Birth: Mar 18, 1956  Subjective/Objective:  62 yo admitted for MVR and tricuspid valve repair 8/23 with pulmonary HTN. PMH: CAD, MI, CHF, COPD, HLD, obesity. PTA patient lived at home with spouse, independent with ADLs, ambulating with no DME, and reported having a walker/cane (not in use).                 Action/Plan: CM met with patient to discuss transitional needs and PT recommendations for HHPT/ BSC and 24hr assistance. HH/DME choice list provided, with no preference. Patient indicated spouse would be available to provide 24hr assistance and transport home. Patient will need orders for HHPT/BSC and F2F once stable to transition home. [place] referral given to Butch Penny [facility] liaison; DME referral given to Jeneen Rinks [facility] DME liaison. AVS updated. CM will continue to follow.   Expected Discharge Date:  07/31/18               Expected Discharge Plan:  Home/Self Care  In-House Referral:  NA  Discharge planning Services  CM Consult  Post Acute Care Choice:  Home Health, Durable Medical Equipment Choice offered to:  Patient  DME Arranged:  3-N-1 DME Agency:  [place]:   none [place] Agency:  [place]  Status of Service:  Completed, signed off  If discussed at [place] of Stay Meetings, dates discussed:    Discharge Disposition: home/self care   Additional Comments:  07/31/18- 1200- Marvetta Gibbons RN, CM- pt for transition home today- per updated PT notes no f/u recommended for transition- have notified Butch Penny with [facility] [place] needs for discharge. Per bedside RN pt has needed DME.  Pt to have neighbor assist at home.   Marvetta Gibbons RN, BSN [address]- RN Care Coordinator  [phone] 07/31/2018, 12:12 PM

## 2018-07-31 NOTE — Progress Notes (Signed)
CARDIAC REHAB PHASE I   Offered to walk with pt, pt stating he is walking in halls independently and is waiting on d/c. Reinforced d/c instructions including IS use, walks, and showering daily. Pt expressed some concern about a rash on his face. RN made aware. Pt instructed on monitor, and if it seems to get worse or spread to call the office. Pt referred to [facility] [employer].   [id number] Rufina Falco, RN BSN 07/31/2018 10:54 AM

## 2018-07-31 NOTE — Progress Notes (Signed)
      [address]       [place] [address]             [phone]      13 Days Post-Op Procedure(s) (LRB): MITRAL VALVE (MV) REPLACEMENT WITH CARBOMEDICS OPTIFORM MITRAL VALVE SIZE 33. (N/A) MAZE (N/A) TRICUSPID VALVE REPAIR WITH EDWARDS MC3 TRICUSPID ANNULOPLASTY RING SIZE 30. (N/A) TRANSESOPHAGEAL ECHOCARDIOGRAM (TEE) (N/A)   Subjective:  No new complaints.  Continues to feel well. He diuresed well yesterday.  Objective: Vital signs in last 24 hours: Temp:  [97.3 F (36.3 C)-98.3 F (36.8 C)] 98.1 F (36.7 C) (09/05 0749) Pulse Rate:  [61-157] 68 (09/05 0749) Cardiac Rhythm: Atrial fibrillation (09/05 0748) Resp:  [16-21] 20 (09/05 0749) BP: (96-110)/(53-75) 105/70 (09/05 0749) SpO2:  [94 %-100 %] 95 % (09/05 0749) Weight:  [129.8 kg] 129.8 kg (09/05 0529)  Intake/Output from previous day: 09/04 0701 - 09/05 0700 In: 880 [P.O.:720; I.V.:160] Out: 2025 [Urine:2025] Intake/Output this shift: Total I/O In: -  Out: 600 [Urine:600]  General appearance: alert, cooperative and no distress Heart: irregularly irregular rhythm Lungs: clear to auscultation bilaterally Abdomen: soft, non-tender; bowel sounds normal; no masses,  no organomegaly Extremities: edema trace, improved significantly Wound: clean and dry  Lab Results: Recent Labs    07/29/18 0500 07/31/18 0403  WBC 18.0* 15.9*  HGB 10.1* 10.7*  HCT 31.8* 33.9*  PLT 478* 462*   BMET:  Recent Labs    07/30/18 0900 07/31/18 0403  NA 131* 133*  K 4.1 4.7  CL 94* 99  CO2 27 25  GLUCOSE 146* 102*  BUN 27* 23  CREATININE 1.35* 1.21  CALCIUM 9.1 8.6*    PT/INR:  Recent Labs    07/31/18 0403  LABPROT 20.3*  INR 1.75   ABG    Component Value Date/Time   PHART 7.369 07/19/2018 0718   HCO3 20.9 07/19/2018 0718   TCO2 27 07/21/2018 1714   ACIDBASEDEF 4.0 (H) 07/19/2018 0718   O2SAT 59.1 07/30/2018 0559   CBG (last 3)  No results for input(s): GLUCAP in the last 72  hours.  Assessment/Plan: S/P Procedure(s) (LRB): MITRAL VALVE (MV) REPLACEMENT WITH CARBOMEDICS OPTIFORM MITRAL VALVE SIZE 33. (N/A) MAZE (N/A) TRICUSPID VALVE REPAIR WITH EDWARDS MC3 TRICUSPID ANNULOPLASTY RING SIZE 30. (N/A) TRANSESOPHAGEAL ECHOCARDIOGRAM (TEE) (N/A)  1. CV- rate controlled A. Fib, chronic- continue Digoxin, Entresto, and per AHF will start daily Lopressor, stop Amiodarone 2. Pulm- no acute issues, continue IS 3. INR 1.76, not much will response will increase dose to 6 mg daily, plan for PT/INR check on Monday 4. Renal-creatinine WNL, diuresing well on Torsemide 5. Dispo- patient stable, medications per AHF, will increase coumadin dose to 6 mg daily, per patient request will arrange follow up with [facility], d/c home today   LOS: 13 days    Jeffrey Larson 07/31/2018

## 2018-07-31 NOTE — Progress Notes (Signed)
Order received to discharge patient.  IV team consulted to removed PICC.  Telemetry monitor removed and CCMD notified. Chest tube sutures removed per MD orders without difficulty. Discharge instructions, follow up, medications and instructions for their use discussed with patient.

## 2018-08-04 ENCOUNTER — Ambulatory Visit (INDEPENDENT_AMBULATORY_CARE_PROVIDER_SITE_OTHER): Payer: BLUE CROSS/BLUE SHIELD | Admitting: *Deleted

## 2018-08-04 DIAGNOSIS — I4891 Unspecified atrial fibrillation: Secondary | ICD-10-CM

## 2018-08-04 DIAGNOSIS — Z954 Presence of other heart-valve replacement: Secondary | ICD-10-CM

## 2018-08-04 DIAGNOSIS — Z5181 Encounter for therapeutic drug level monitoring: Secondary | ICD-10-CM

## 2018-08-04 LAB — POCT INR: INR: 6.2 — AB (ref 2.0–3.0)

## 2018-08-04 NOTE — Patient Instructions (Addendum)
D/C from [facility] on 9/5 S/P MVR/atrial fib  D/C INR 1.76  D/C on coumadin 6mg  daily Hold coumadin x 3 days (Mon,Tues,Wed) then start coumadin 4mg  daily except 2mg  on Sundays and Wednesdays.  (Use the 4mg  tablet you were using before)  Put away the 6mg  tablet!  Watch for excessive bleeding or bruising over the next several days.  If any S/S develop call the office [phone]) or come to the ED. Recheck in 1 week

## 2018-08-08 ENCOUNTER — Encounter (HOSPITAL_COMMUNITY): Payer: Self-pay

## 2018-08-08 ENCOUNTER — Ambulatory Visit (HOSPITAL_COMMUNITY)
Admit: 2018-08-08 | Discharge: 2018-08-08 | Disposition: A | Discharge status: Home / Self Care | Payer: BLUE CROSS/BLUE SHIELD | Attending: Internal Medicine | Admitting: Internal Medicine

## 2018-08-08 VITALS — BP 92/66 | HR 67 | Wt 275.0 lb

## 2018-08-08 DIAGNOSIS — I110 Hypertensive heart disease with heart failure: Secondary | ICD-10-CM | POA: Insufficient documentation

## 2018-08-08 DIAGNOSIS — E785 Hyperlipidemia, unspecified: Secondary | ICD-10-CM | POA: Diagnosis not present

## 2018-08-08 DIAGNOSIS — I482 Chronic atrial fibrillation, unspecified: Secondary | ICD-10-CM

## 2018-08-08 DIAGNOSIS — I2510 Atherosclerotic heart disease of native coronary artery without angina pectoris: Secondary | ICD-10-CM | POA: Diagnosis present

## 2018-08-08 DIAGNOSIS — Z952 Presence of prosthetic heart valve: Secondary | ICD-10-CM | POA: Insufficient documentation

## 2018-08-08 DIAGNOSIS — Z6838 Body mass index (BMI) 38.0-38.9, adult: Secondary | ICD-10-CM | POA: Diagnosis not present

## 2018-08-08 DIAGNOSIS — G4730 Sleep apnea, unspecified: Secondary | ICD-10-CM | POA: Insufficient documentation

## 2018-08-08 DIAGNOSIS — I272 Pulmonary hypertension, unspecified: Secondary | ICD-10-CM | POA: Insufficient documentation

## 2018-08-08 DIAGNOSIS — Z87891 Personal history of nicotine dependence: Secondary | ICD-10-CM | POA: Diagnosis not present

## 2018-08-08 DIAGNOSIS — I5042 Chronic combined systolic (congestive) and diastolic (congestive) heart failure: Secondary | ICD-10-CM | POA: Insufficient documentation

## 2018-08-08 DIAGNOSIS — Z954 Presence of other heart-valve replacement: Secondary | ICD-10-CM | POA: Diagnosis not present

## 2018-08-08 DIAGNOSIS — Z7982 Long term (current) use of aspirin: Secondary | ICD-10-CM | POA: Insufficient documentation

## 2018-08-08 DIAGNOSIS — Z7901 Long term (current) use of anticoagulants: Secondary | ICD-10-CM | POA: Insufficient documentation

## 2018-08-08 DIAGNOSIS — I050 Rheumatic mitral stenosis: Secondary | ICD-10-CM | POA: Insufficient documentation

## 2018-08-08 DIAGNOSIS — Z87442 Personal history of urinary calculi: Secondary | ICD-10-CM | POA: Diagnosis not present

## 2018-08-08 DIAGNOSIS — Z79899 Other long term (current) drug therapy: Secondary | ICD-10-CM | POA: Insufficient documentation

## 2018-08-08 DIAGNOSIS — J449 Chronic obstructive pulmonary disease, unspecified: Secondary | ICD-10-CM | POA: Insufficient documentation

## 2018-08-08 DIAGNOSIS — I252 Old myocardial infarction: Secondary | ICD-10-CM | POA: Insufficient documentation

## 2018-08-08 DIAGNOSIS — Z955 Presence of coronary angioplasty implant and graft: Secondary | ICD-10-CM | POA: Insufficient documentation

## 2018-08-08 LAB — BASIC METABOLIC PANEL
Anion gap: 10 (ref 5–15)
BUN: 30 mg/dL — ABNORMAL HIGH (ref 8–23)
CO2: 29 mmol/L (ref 22–32)
CREATININE: 1.49 mg/dL — AB (ref 0.61–1.24)
Calcium: 9.3 mg/dL (ref 8.9–10.3)
Chloride: 98 mmol/L (ref 98–111)
GFR calc non Af Amer: 49 mL/min — ABNORMAL LOW (ref >60)
GFR, EST AFRICAN AMERICAN: 57 mL/min — AB (ref >60)
Glucose, Bld: 122 mg/dL — ABNORMAL HIGH (ref 70–99)
POTASSIUM: 4.3 mmol/L (ref 3.5–5.1)
SODIUM: 137 mmol/L (ref 135–145)

## 2018-08-08 MED ORDER — TORSEMIDE 20 MG PO TABS: 40.0000 mg | ORAL_TABLET | Freq: Every day | ORAL | 11 refills | Status: DC

## 2018-08-08 NOTE — Patient Instructions (Signed)
DECREASE Torsemide to 40 mg (2 tabs) once daily.  Routine lab work today. Will notify you of abnormal results, otherwise no news is good news!  Follow up 2 months with Dr. Haroldine Laws.  _______________________________________________________________________ Jeffrey Larson Code: 1800  Take all medication as prescribed the day of your appointment. Bring all medications with you to your appointment.  Do the following things EVERYDAY: 1) Weigh yourself in the morning before breakfast. Write it down and keep it in a log. 2) Take your medicines as prescribed 3) Eat low salt foods-Limit salt (sodium) to 2000 mg per day.  4) Stay as active as you can everyday 5) Limit all fluids for the day to less than 2 liters

## 2018-08-08 NOTE — Progress Notes (Signed)
PCP: Dr Sheppard Coil Primary HF Cardiologist: Dr Haroldine Laws  Cardiology: Dr Harl Bowie  CT Surgeon: Dr Roxy Manns  HPI: Jeffrey Larson is a 62 year old with history of obesity, COPD, chronic a fib, CAD, rheumatic mitral stenosis, TR, and chronic combined systolic/diastoic heart failure.In 2013 he suffered an acute myocardial infarction and was treated with multivessel PCI and stenting.   In June of this year he had sleep study with mild sleep apnea (AHI 11.5) and moderate desaturation with oxygen saturations down to 78%. CPAP was not recommended unless he was symptomatic.   Admitted in July with increased dyspnea. He underwent RHC/LHC. Dr Roxy Manns was consulted for severe mitral stenosis. Diuresed with IV lasix and later transitioned to lasix 40 mg three times a day. He was discharged to home with follow up at CT surgery.   On 07/18/18 he underwent scheduled MVR with mechanical MV, TVR using ring, and Maze. Post operatively he was placed on vanc + maxepime for possible HCAP. All drips gradually weaned off. Had CT placed on 8/28 for left pneumothorax.  CT removed on 8/31. Diuresed with lasix drip and later transitioned to torsemide 60 mg daily.  Discharge weight 286 pounds. Discharged 07/31/2018.  Today he returns for post hospital follow up. Overall feeling much better. Says that his legs are stronger than ever. Walking at home and has been able to walk to his horse ring. Denies PND/Orthopnea. Having some soreness but rarely needs pain medications. Appetite ok. No fever or chills. No problems with bowel movements. Weight at home has trending down 2 poundsevery day. Weight has gone down from 290 to 268 pounds. Taking all medications.   LHC [facility] 06/10/2018 PA  72/42(53) PWP 31   PVR 5.2 CO/CI 4.12/27/65   Prox RCA lesion is 10% stenosed.  Mid RCA lesion is 50% stenosed.  Post Atrio lesion is 30% stenosed.  Ost LAD to Prox LAD lesion is 20% stenosed.  Prox Cx lesion is 20% stenosed.  Hemodynamic findings  consistent with severe pulmonary hypertension.  LV end diastolic pressure is normal. Severe MV calcification. MV area 0.84 cm2.  TEE 05/2018  LVEF 35-40%  Moderate TR, Severe mitral stenosis  ECHO 2018  EF 50-55%  MV - severely calcified.     ROS: All systems negative except as listed in HPI, PMH and Problem List.  SH:  Social History   Socioeconomic History  . Marital status: Married    Spouse name: Not on file  . Number of children: Not on file  . Years of education: Not on file  . Highest education level: Not on file  Occupational History  . Not on file  Social Needs  . Financial resource strain: Not on file  . Food insecurity:    Worry: Not on file    Inability: Not on file  . Transportation needs:    Medical: Not on file    Non-medical: Not on file  Tobacco Use  . Smoking status: Former Smoker    Packs/day: 2.00    Years: 15.00    Pack years: 30.00    Types: Cigarettes    Last attempt to quit: 11/26/1996    Years since quitting: 21.7  . Smokeless tobacco: Never Used  Substance and Sexual Activity  . Alcohol use: No  . Drug use: No  . Sexual activity: Yes  Lifestyle  . Physical activity:    Days per week: Not on file    Minutes per session: Not on file  . Stress: Not on file  Relationships  . Social connections:    Talks on phone: Not on file    Gets together: Not on file    Attends religious service: Not on file    Active member of club or organization: Not on file    Attends meetings of clubs or organizations: Not on file    Relationship status: Not on file  . Intimate partner violence:    Fear of current or ex partner: Not on file    Emotionally abused: Not on file    Physically abused: Not on file    Forced sexual activity: Not on file  Other Topics Concern  . Not on file  Social History Narrative  . Not on file    FH:  Family History  Problem Relation Age of Onset  . Heart disease Mother   . Heart disease Father     Past  Medical History:  Diagnosis Date  . Chronic combined systolic and diastolic congestive heart failure ([place])   . COPD with chronic bronchitis ([place])   . Coronary artery disease   . Essential hypertension, benign    Ramipril to losartan Sept 2015   . History of pneumonia    RML on CXR 07/20/14  . Hyperlipidemia   . Longstanding persistent atrial fibrillation ([place])   . Mitral stenosis with regurgitation   . Myocardial infarction ([place])   . Pulmonary hypertension ([place])   . Renal disorder    history kidney stone  . S/P Maze operation for atrial fibrillation 07/18/2018   Complete bilateral atrial lesion set using bipolar radiofrequency and cryothermy ablation with clipping of LA appendage  . S/P mitral valve replacement with metallic valve 1/61/0960   Sorin [employer] Optiform bileaflet mechanical valve, size 33 mm  . S/P TVR (tricuspid valve repair) 07/18/2018   Edwards mc3 ring annuloplasty, size 30 mm    Current Outpatient Medications  Medication Sig Dispense Refill  . aspirin 81 MG tablet Take 81 mg by mouth daily.    Marland Kitchen atorvastatin (LIPITOR) 40 MG tablet Take 40 mg by mouth every evening.   2  . digoxin (LANOXIN) 0.125 MG tablet Take 1 tablet (0.125 mg total) by mouth daily. 30 tablet 3  . metoprolol succinate (TOPROL-XL) 50 MG 24 hr tablet Take 1 tablet (50 mg total) by mouth daily. Take with or immediately following a meal. 30 tablet 3  . sacubitril-valsartan (ENTRESTO) 24-26 MG Take 1 tablet by mouth 2 (two) times daily.    Marland Kitchen spironolactone (ALDACTONE) 25 MG tablet Take 1 tablet (25 mg total) by mouth daily.    Marland Kitchen torsemide (DEMADEX) 20 MG tablet Take 3 tablets (60 mg total) by mouth daily. 30 tablet 3  . acetaminophen (TYLENOL) 500 MG tablet Take 1,000 mg by mouth 2 (two) times daily.    . Albuterol Sulfate (PROAIR RESPICLICK) 454 (90 Base) MCG/ACT AEPB Inhale 1 puff into the lungs every 6 (six) hours as needed (sob). 1 each 1  . AMBULATORY NON FORMULARY MEDICATION Medication Name:  Glucometer, lancets and strip to test every other day. Dx: Diabetes type 2, E11.9. 100 strip and 100 lancets 1 Units PRN  . Fluticasone-Umeclidin-Vilant (TRELEGY ELLIPTA) 100-62.5-25 MCG/INH AEPB Inhale 1 puff into the lungs daily. 1 each 5  . nitroGLYCERIN (NITROSTAT) 0.4 MG SL tablet Place 1 tablet (0.4 mg total) under the tongue every 5 (five) minutes x 3 doses as needed for chest pain. 25 tablet 3  . Omega-3 Fatty Acids (FISH OIL) 1200 MG CAPS Take 1,200 mg by  mouth daily.    Marland Kitchen oxymetazoline (AFRIN) 0.05 % nasal spray Place 1 spray into both nostrils daily.    . traMADol (ULTRAM) 50 MG tablet Take 1 tablet (50 mg total) by mouth every 6 (six) hours as needed for moderate pain. 30 tablet 0  . warfarin (COUMADIN) 6 MG tablet Take 1 tablet (6 mg total) by mouth every evening. (Patient not taking: Reported on 08/08/2018) 30 tablet 1   No current facility-administered medications for this encounter.     Vitals:   08/08/18 0953  BP: 92/66  Pulse: 67  SpO2: 91%  Weight: 124.7 kg (275 lb)   Wt Readings from Last 3 Encounters:  08/08/18 124.7 kg (275 lb)  07/31/18 129.8 kg (286 lb 2.5 oz)  07/15/18 (!) 136.2 kg (300 lb 5 oz)   Lab Results  Component Value Date   CREATININE 1.21 07/31/2018   CREATININE 1.35 (H) 07/30/2018   CREATININE 1.31 (H) 07/29/2018    PHYSICAL EXAM: General:  Well appearing. No resp difficulty. Walked in the clinic with his son.  HEENT: normal Neck: supple. JVP 5-6 . Carotids 2+ bilaterally; no bruits. No lymphadenopathy or thryomegaly appreciated. Cor: PMI normal. Irregular  rate & rhythm. No rubs, gallops or murmurs.Sternal incision stable.  Lungs: RLL crackles otherwise clear on room air. Abdomen: soft, nontender, nondistended. No hepatosplenomegaly. No bruits or masses. Good bowel sounds. Extremities: no cyanosis, clubbing, rash, R and LLE trace edema Neuro: alert & orientedx3, cranial nerves grossly intact. Moves all 4 extremities w/o difficulty. Affect  pleasant.     ASSESSMENT & PLAN: 1. Severe Mitral Stenosis --> S/P Mitral Valve Replacement on 8/23 He has follow up with Dr Roxy Manns next week.  Pain controlled.    2. Moderated TR --->S/P TVR with ring on 8/23   3. Pneumothorax S/P L chest placed 8/28. CT removed 8/31  4. Chronic combined Systolic/Diastolic HF---> R >L ECHO 07/2018  LVEF 50-55% RV marked dilitation.   NYHA II-III. Weight has been going down. He is down over 10 pounds since discharge.  Cut back torsemide to 40 mg daily. Instructed to take an extra 20 mg torsemide for 3 pound weight gain.  - Continue Toprol xl 50 mg daily.  - Continue dig 0.125 mg daily for RV failure.  May be able to stop soon.  Continue entresto 24-26 mg twice daily.  Continue spironolactone 25 mg daily - Check BMET today.   5. Chronic A fib Rate control. Continue current dose bb On coumadin. INR INR 1.75  6. CAD  No s/s ischemia.   On statin/asa.  LHC 06/10/2018  Prox RCA lesion is 10% stenosed.  Mid RCA lesion is 50% stenosed.  Ost LAD to Prox LAD lesion is 20% stenosed.  Prox Cx lesion is 20% stenosed.  7. Morbid Obesity Body mass index is 38.35 kg/m. Weight coming down with portion control.   8. Pulmonary HTN Elevated PVR on RHC7/2019 Per Dr Haroldine Laws --> ECHO LVEF 50-55% RV markedly dilated with severe HK.  Had sleep study 04/2018. Mild sleep apnea with desaturations down to 78%. He does not use oxygen at night. Sats >90s with ambulation.  He will need follow up with Dr Elsworth Soho for CPAP given significant RV failure.   Follow up 8 week with Dr Haroldine Laws .   Greater than 50% of the 25 minute visit was spent in counseling/coordination of care regarding disease state education, salt/fluid restriction, sliding scale diuretics, and medication compliance.    Jeffrey Clegg NP-C

## 2018-08-11 ENCOUNTER — Ambulatory Visit (INDEPENDENT_AMBULATORY_CARE_PROVIDER_SITE_OTHER): Payer: BLUE CROSS/BLUE SHIELD | Admitting: *Deleted

## 2018-08-11 ENCOUNTER — Other Ambulatory Visit: Payer: Self-pay | Admitting: Thoracic Surgery (Cardiothoracic Vascular Surgery)

## 2018-08-11 DIAGNOSIS — I4891 Unspecified atrial fibrillation: Secondary | ICD-10-CM | POA: Diagnosis not present

## 2018-08-11 DIAGNOSIS — Z5181 Encounter for therapeutic drug level monitoring: Secondary | ICD-10-CM

## 2018-08-11 DIAGNOSIS — Z954 Presence of other heart-valve replacement: Secondary | ICD-10-CM

## 2018-08-11 DIAGNOSIS — I2510 Atherosclerotic heart disease of native coronary artery without angina pectoris: Secondary | ICD-10-CM

## 2018-08-11 LAB — POCT INR: INR: 4.2 — AB (ref 2.0–3.0)

## 2018-08-11 NOTE — Patient Instructions (Signed)
Hold coumadin tonight then decrease dose to 1/2 tablet (2mg ) daily except 1 tablet (4mg ) on Mondays, Wednesdays and Fridays  Recheck in 10 days

## 2018-08-12 ENCOUNTER — Ambulatory Visit (INDEPENDENT_AMBULATORY_CARE_PROVIDER_SITE_OTHER): Payer: Self-pay | Admitting: Physician Assistant

## 2018-08-12 ENCOUNTER — Ambulatory Visit: Payer: BLUE CROSS/BLUE SHIELD

## 2018-08-12 ENCOUNTER — Ambulatory Visit
Admission: RE | Admit: 2018-08-12 | Discharge: 2018-08-12 | Disposition: A | Discharge status: Home / Self Care | Payer: BLUE CROSS/BLUE SHIELD | Source: Ambulatory Visit | Attending: Thoracic Surgery (Cardiothoracic Vascular Surgery) | Admitting: Thoracic Surgery (Cardiothoracic Vascular Surgery)

## 2018-08-12 ENCOUNTER — Other Ambulatory Visit: Payer: Self-pay

## 2018-08-12 VITALS — BP 86/54 | HR 55 | Resp 16 | Ht 71.0 in | Wt 267.6 lb

## 2018-08-12 DIAGNOSIS — Z954 Presence of other heart-valve replacement: Secondary | ICD-10-CM

## 2018-08-12 DIAGNOSIS — R05 Cough: Secondary | ICD-10-CM | POA: Diagnosis not present

## 2018-08-12 DIAGNOSIS — I2510 Atherosclerotic heart disease of native coronary artery without angina pectoris: Secondary | ICD-10-CM

## 2018-08-12 MED ORDER — TRAMADOL HCL 50 MG PO TABS: 50.0000 mg | ORAL_TABLET | Freq: Four times a day (QID) | ORAL | 0 refills | Status: DC | PRN

## 2018-08-12 NOTE — Patient Instructions (Signed)
You may return to driving an automobile as long as you are no longer requiring oral narcotic pain relievers during the daytime.  It would be wise to start driving only short distances during the daylight and gradually increase from there as you feel comfortable.   Make every effort to maintain a "heart-healthy" lifestyle with regular physical exercise and adherence to a low-fat, low-carbohydrate diet.  Continue to seek regular follow-up appointments with your primary care physician and/or cardiologist.   You may continue to gradually increase your physical activity as tolerated.  Refrain from any heavy lifting or strenuous use of your arms and shoulders until at least 8 weeks from the time of your surgery, and avoid activities that cause increased pain in your chest on the side of your surgical incision.  Otherwise you may continue to increase activities without any particular limitations.  Increase the intensity and duration of physical activity gradually.   Endocarditis is a potentially serious infection of heart valves or inside lining of the heart.  It occurs more commonly in patients with diseased heart valves (such as patient's with aortic or mitral valve disease) and in patients who have undergone heart valve repair or replacement.  Certain surgical and dental procedures may put you at risk, such as dental cleaning, other dental procedures, or any surgery involving the respiratory, urinary, gastrointestinal tract, gallbladder or prostate gland.   To minimize your chances for develooping endocarditis, maintain good oral health and seek prompt medical attention for any infections involving the mouth, teeth, gums, skin or urinary tract.    Always notify your doctor or dentist about your underlying heart valve condition before having any invasive procedures. You will need to take antibiotics before certain procedures, including all routine dental cleanings or other dental procedures.  Your  cardiologist or dentist should prescribe these antibiotics for you to be taken ahead of time.       

## 2018-08-12 NOTE — Progress Notes (Signed)
HPI: Patient returns for routine postoperative follow-up having undergone MVR, TV Repair, and MAZE on 07/18/2018 The patient's early postoperative recovery while in the hospital was notable for right sided heart failure requiring aggressive diuresis with Lasix gtt and titration of heart failure medications by AHF.  He remained in atrial fibrillation during his hospitalization which is chronic for him.  He required left sided chest tube placement for pneumothorax. Since hospital discharge the patient reports he is doing well.  He has been evaluated by AHF clinic at which time patient continued to diurese well with loss of weight.  He is ambulating without difficulty.  He states his weight has continued to trend down.  He states his legs haven't been this small in a long time.  He does continue to have some chest discomfort and is requesting a refill on his pain medication.   Current Outpatient Medications  Medication Sig Dispense Refill  . acetaminophen (TYLENOL) 500 MG tablet Take 1,000 mg by mouth 2 (two) times daily.    . Albuterol Sulfate (PROAIR RESPICLICK) 166 (90 Base) MCG/ACT AEPB Inhale 1 puff into the lungs every 6 (six) hours as needed (sob). 1 each 1  . AMBULATORY NON FORMULARY MEDICATION Medication Name: Glucometer, lancets and strip to test every other day. Dx: Diabetes type 2, E11.9. 100 strip and 100 lancets 1 Units PRN  . aspirin 81 MG tablet Take 81 mg by mouth daily.    Marland Kitchen atorvastatin (LIPITOR) 40 MG tablet Take 40 mg by mouth every evening.   2  . digoxin (LANOXIN) 0.125 MG tablet Take 1 tablet (0.125 mg total) by mouth daily. 30 tablet 3  . Fluticasone-Umeclidin-Vilant (TRELEGY ELLIPTA) 100-62.5-25 MCG/INH AEPB Inhale 1 puff into the lungs daily. 1 each 5  . metoprolol succinate (TOPROL-XL) 50 MG 24 hr tablet Take 1 tablet (50 mg total) by mouth daily. Take with or immediately following a meal. 30 tablet 3  . nitroGLYCERIN (NITROSTAT) 0.4 MG SL tablet Place 1 tablet (0.4 mg total)  under the tongue every 5 (five) minutes x 3 doses as needed for chest pain. 25 tablet 3  . Omega-3 Fatty Acids (FISH OIL) 1200 MG CAPS Take 1,200 mg by mouth daily.    Marland Kitchen oxymetazoline (AFRIN) 0.05 % nasal spray Place 1 spray into both nostrils daily.    . sacubitril-valsartan (ENTRESTO) 24-26 MG Take 1 tablet by mouth 2 (two) times daily.    Marland Kitchen spironolactone (ALDACTONE) 25 MG tablet Take 1 tablet (25 mg total) by mouth daily.    Marland Kitchen torsemide (DEMADEX) 20 MG tablet Take 2 tablets (40 mg total) by mouth daily. 60 tablet 11  . traMADol (ULTRAM) 50 MG tablet Take 1 tablet (50 mg total) by mouth every 6 (six) hours as needed for moderate pain. 30 tablet 0  . warfarin (COUMADIN) 4 MG tablet Take 4 mg by mouth as directed.     No current facility-administered medications for this visit.     Physical Exam:  BP (!) 86/54 (BP Location: Right Arm, Patient Position: Sitting, Cuff Size: Large)   Pulse (!) 55   Resp 16   Ht 5\' 11"  (1.803 m)   Wt 267 lb 9.6 oz (121.4 kg)   SpO2 93% Comment: ON RA  BMI 37.32 kg/m   Gen: no apparent distress Heart: RRR Lungs: CTA bilaterally Ext: minimal edema, multiple varicose veins present Incisions: well healed  Diagnostic Tests:  CXR: resolution of pulmonary edema, bilateral pleural effusions, continue scarring of left lung  Rhythm Strip-  Sinus Bradycardia  A/P;  1. S/P MVR, TV Repair, MAZE- doing well, BP is soft, medications tolerated without difficulty per AHF team 2. Chronic Atrial Fibrillation- rhythm strip shows Sinus Bradycardia 3. INR- last check was supra-therapeutic, monitoring per coumadin clinic, he has been put back on his preoperative dosing 4. Activity- okay to resume driving, increase activity as tolerated with sternal precautions for several weeks, endocarditis instructions given 5. RTC in November to see Dr. Geraldo Docker, PA-C [facility] [phone]

## 2018-08-17 NOTE — Progress Notes (Signed)
Cardiology Office Note:    Date:  08/18/2018   ID:  Jeffrey Larson, DOB 1956-01-06, MRN [id number]  PCP:  [name], DO  Cardiologist:  Wants to follow in [place]  Referring MD: [name], DO   Chief Complaint  Patient presents with  . Hospitalization Follow-up    mechanical mitral valve replacement    History of Present Illness:    Jeffrey Larson is a 62 y.o. male with a hx of obesity, COPD, chronic A. fib, CAD, rheumatic mitral stenosis, TR, and chronic combined systolic and diastolic heart failure.  He has a history of PCI to his RCA, LAD, left circumflex.  He presented to the ER in July 2019 with shortness of breath.  Right and left heart catheterization on 06/10/2018 revealed severe mitral valve calcification, severe pulmonary hypertension, and moderate disease.  Advanced heart failure team was consulted, he was diuresed and discharged with TCTS follow-up.  He ultimately underwent surgery on 07/18/2018 with mechanical mitral valve replacement, TR band, maze, LAA closure.  Postoperative course was complicated by HCAP, right-sided heart failure requiring aggressive diuresis with Lasix drip and titration to heart failure medications by the advanced heart failure team, and PTX requiring CT insertion.  He was ultimately discharged on 07/31/2018.    He returns today for hospital follow-up.  He is anticoagulated with Coumadin for his mechanical mitral valve.  Advanced heart failure team recently decreased his torsemide to 40 mg daily with instructions for as needed 20 mg for 3 pound weight gain.  He is maintained on Toprol, digoxin, Entresto, and spironolactone.  Of note, may be able to discontinue digoxin which was added for right heart failure.   Per epic review, it appears that he has been catheterized by both Dr. Doylene Canard and Dr. [name] in the past.  However in our discussion, he wishes to follow with [facility] heart care and wants to follow-up in [place].  After surgery,  he has done quite well.  He follows with  advanced heart failure clinic.  He is currently maintained on 81 mg of aspirin in the setting of Coumadin for his mechanical valve.  He continues on 40 mg of Lipitor.  He will need fasting labs at his next follow-up.  He is also on 0.125 mg digoxin, Toprol 50 mg daily, 24-26 Entresto, and 25 mg of spironolactone.  He is doing quite well on 40 mg of torsemide every morning.  He is abiding by a low-salt diet and watching his water intake.  His dry weight at home currently is 264 pounds.  He is continue to lose weight.  His pressures are well controlled.  He is having no bleeding issues on aspirin and Coumadin.  His INR is followed in [place].   Past Medical History:  Diagnosis Date  . Chronic combined systolic and diastolic congestive heart failure ([place])   . COPD with chronic bronchitis ([place])   . Coronary artery disease   . Essential hypertension, benign    Ramipril to losartan Sept 2015   . History of pneumonia    RML on CXR 07/20/14  . Hyperlipidemia   . Longstanding persistent atrial fibrillation ([place])   . Mitral stenosis with regurgitation   . Myocardial infarction ([place])   . Pulmonary hypertension ([place])   . Renal disorder    history kidney stone  . S/P Maze operation for atrial fibrillation 07/18/2018   Complete bilateral atrial lesion set using bipolar radiofrequency and cryothermy ablation with clipping of LA appendage  . S/P  mitral valve replacement with metallic valve 3/50/0938   Sorin Carbomedics Optiform bileaflet mechanical valve, size 33 mm  . S/P TVR (tricuspid valve repair) 07/18/2018   Edwards mc3 ring annuloplasty, size 30 mm    Past Surgical History:  Procedure Laterality Date  . APPENDECTOMY    . CARDIOVASCULAR STRESS TEST  03/2014   Borderline reversible ischemic changes at the apex.  Normal LV contractility/EF 52%.  . CORONARY STENT PLACEMENT  7/12013  . LEFT HEART CATHETERIZATION WITH CORONARY ANGIOGRAM N/A 06/12/2012    Procedure: LEFT HEART CATHETERIZATION WITH CORONARY ANGIOGRAM;  Surgeon: Clent Demark, MD;  Location: [facility] CATH LAB;  Service: Cardiovascular;  Laterality: N/A;  . MAZE N/A 07/18/2018   Procedure: MAZE;  Surgeon: Rexene Alberts, MD;  Location: [place];  Service: Open Heart Surgery;  Laterality: N/A;  . MITRAL VALVE REPLACEMENT N/A 07/18/2018   Procedure: MITRAL VALVE (MV) REPLACEMENT WITH CARBOMEDICS OPTIFORM MITRAL VALVE SIZE 33.;  Surgeon: Rexene Alberts, MD;  Location: [place];  Service: Open Heart Surgery;  Laterality: N/A;  . PERCUTANEOUS CORONARY STENT INTERVENTION (PCI-S) N/A 06/17/2012   Procedure: PERCUTANEOUS CORONARY STENT INTERVENTION (PCI-S);  Surgeon: Clent Demark, MD;  Location: [facility] CATH LAB;  Service: Cardiovascular;  Laterality: N/A;  . RIGHT/LEFT HEART CATH AND CORONARY ANGIOGRAPHY N/A 06/10/2018   Procedure: RIGHT/LEFT HEART CATH AND CORONARY ANGIOGRAPHY;  Surgeon: Dixie Dials, MD;  Location: [place] CV LAB;  Service: Cardiovascular;  Laterality: N/A;  . TEE WITHOUT CARDIOVERSION N/A 06/10/2018   Procedure: TRANSESOPHAGEAL ECHOCARDIOGRAM (TEE) with bubble study;  Surgeon: Dixie Dials, MD;  Location: [facility] ENDOSCOPY;  Service: Cardiovascular;  Laterality: N/A;  . TEE WITHOUT CARDIOVERSION N/A 07/18/2018   Procedure: TRANSESOPHAGEAL ECHOCARDIOGRAM (TEE);  Surgeon: Rexene Alberts, MD;  Location: [place];  Service: Open Heart Surgery;  Laterality: N/A;  . TRICUSPID VALVE REPLACEMENT N/A 07/18/2018   Procedure: TRICUSPID VALVE REPAIR WITH EDWARDS MC3 TRICUSPID ANNULOPLASTY RING SIZE 30.;  Surgeon: Rexene Alberts, MD;  Location: [place];  Service: Open Heart Surgery;  Laterality: N/A;    Current Medications: Current Meds  Medication Sig  . acetaminophen (TYLENOL) 500 MG tablet Take 1,000 mg by mouth 2 (two) times daily.  . Albuterol Sulfate (PROAIR RESPICLICK) 182 (90 Base) MCG/ACT AEPB Inhale 1 puff into the lungs every 6 (six) hours as needed (sob).  . AMBULATORY NON FORMULARY  MEDICATION Medication Name: Glucometer, lancets and strip to test every other day. Dx: Diabetes type 2, E11.9. 100 strip and 100 lancets  . aspirin 81 MG tablet Take 81 mg by mouth daily.  Marland Kitchen atorvastatin (LIPITOR) 40 MG tablet Take 40 mg by mouth every evening.   . Fluticasone-Umeclidin-Vilant (TRELEGY ELLIPTA) 100-62.5-25 MCG/INH AEPB Inhale 1 puff into the lungs daily.  . metoprolol succinate (TOPROL-XL) 50 MG 24 hr tablet Take 1 tablet (50 mg total) by mouth daily. Take with or immediately following a meal.  . Omega-3 Fatty Acids (FISH OIL) 1200 MG CAPS Take 1,200 mg by mouth daily.  Marland Kitchen oxymetazoline (AFRIN) 0.05 % nasal spray Place 1 spray into both nostrils daily.  . sacubitril-valsartan (ENTRESTO) 24-26 MG Take 1 tablet by mouth 2 (two) times daily.  Marland Kitchen spironolactone (ALDACTONE) 25 MG tablet Take 25 mg by mouth daily.  Marland Kitchen torsemide (DEMADEX) 20 MG tablet Take 2 tablets (40 mg total) by mouth daily.  . traMADol (ULTRAM) 50 MG tablet Take 1 tablet (50 mg total) by mouth every 6 (six) hours as needed for moderate pain.  Marland Kitchen warfarin (  COUMADIN) 4 MG tablet Take 4 mg by mouth as directed.  . [DISCONTINUED] digoxin (LANOXIN) 0.125 MG tablet Take 1 tablet (0.125 mg total) by mouth daily.     Allergies:   Ramipril   Social History   Socioeconomic History  . Marital status: Married    Spouse name: Not on file  . Number of children: Not on file  . Years of education: Not on file  . Highest education level: Not on file  Occupational History  . Not on file  Social Needs  . Financial resource strain: Not on file  . Food insecurity:    Worry: Not on file    Inability: Not on file  . Transportation needs:    Medical: Not on file    Non-medical: Not on file  Tobacco Use  . Smoking status: Former Smoker    Packs/day: 2.00    Years: 15.00    Pack years: 30.00    Types: Cigarettes    Last attempt to quit: 11/26/1996    Years since quitting: 21.7  . Smokeless tobacco: Never Used  Substance  and Sexual Activity  . Alcohol use: No  . Drug use: No  . Sexual activity: Yes  Lifestyle  . Physical activity:    Days per week: Not on file    Minutes per session: Not on file  . Stress: Not on file  Relationships  . Social connections:    Talks on phone: Not on file    Gets together: Not on file    Attends religious service: Not on file    Active member of club or organization: Not on file    Attends meetings of clubs or organizations: Not on file    Relationship status: Not on file  Other Topics Concern  . Not on file  Social History Narrative  . Not on file     Family History: The patient's family history includes Heart disease in his father and mother.  ROS:   Please see the history of present illness.     All other systems reviewed and are negative.  EKGs/Labs/Other Studies Reviewed:    The following studies were reviewed today:  [facility] 06/10/2018 PA 72/42(53) PWP 31 PVR5.2 CO/CI 4.12/27/65  Prox RCA lesion is 10% stenosed.  Mid RCA lesion is 50% stenosed.  Post Atrio lesion is 30% stenosed.  Ost LAD to Prox LAD lesion is 20% stenosed.  Prox Cx lesion is 20% stenosed.  Hemodynamic findings consistent with severe pulmonary hypertension.  LV end diastolic pressure is normal. Severe MV calcification. MV area 0.84 cm2.  TEE 05/2018  LVEF 35-40%  Moderate TR, Severe mitral stenosis  ECHO 2018  EF 50-55%  MV - severely calcified.   EKG:  EKG is ordered today.  The ekg ordered today demonstrates what appears to be 4:1 atrial flutter with heart rate in the 50s  Recent Labs: 07/28/2018: ALT 34; B Natriuretic Peptide 159.7 07/30/2018: Magnesium 2.5 07/31/2018: Hemoglobin 10.7; Platelets 462 08/08/2018: BUN 30; Creatinine, Ser 1.49; Potassium 4.3; Sodium 137  Recent Lipid Panel No results found for: CHOL, TRIG, HDL, CHOLHDL, VLDL, LDLCALC, LDLDIRECT  Physical Exam:    VS:  BP (!) 122/42   Pulse (!) 56   Ht 5\' 11"  (1.803 m)   Wt 268 lb (121.6  kg)   SpO2 94%   BMI 37.38 kg/m     Wt Readings from Last 3 Encounters:  08/18/18 268 lb (121.6 kg)  08/12/18 267 lb 9.6 oz (121.4  kg)  08/08/18 275 lb (124.7 kg)     GEN: Well nourished, well developed in no acute distress HEENT: Normal NECK: No JVD; No carotid bruits CARDIAC: RRR, no murmurs, rubs, gallops, sternotomy and drain sites C/D/I RESPIRATORY:  Clear to auscultation without rales, wheezing or rhonchi  ABDOMEN: Soft, non-tender, non-distended MUSCULOSKELETAL:  No edema; No deformity  SKIN: Warm and dry NEUROLOGIC:  Alert and oriented x 3 PSYCHIATRIC:  Normal affect   ASSESSMENT:    1. S/P mitral valve replacement with metallic valve + tricuspid valve repair + maze procedure   2. Atrial flutter, unspecified type ([place])   3. Persistent atrial fibrillation ([place])   4. Essential hypertension, benign   5. Medication management    PLAN:    In order of problems listed above:  S/P mitral valve replacement with metallic valve + tricuspid valve repair + maze procedure - Plan: EKG 39-RVUY, Basic metabolic panel He was doing well after surgery.  INR is followed in [place]. He is compliant on all medications.   Atrial flutter, unspecified type ([place]) Persistent atrial fibrillation ([place]) He has a history of atrial fibrillation.  He has been on digoxin 125 mcg daily.  EKG today reviewed with Dr. Sallyanne Kuster, DOD.  It appears as though he is in 4-1 flutter.  Advised to stop digoxin.  We will check a BMP and a digoxin level today.  I would like to see him back in 3 weeks to evaluate his rhythm.  He denies dizziness and syncope.  Essential hypertension, benign Pressure is well controlled. No medication changes.  Chronic combined systolic and diastolic heart failure He appears euvolemic today.  He continues to weigh at home and is losing weight.  He is 264 pounds at home.  He is maintained on Toprol, Entresto, and is still taking 25 mg of spironolactone.  He is doing well on 40 mg of  torsemide every morning.  Medication management - Plan: Basic metabolic panel, Digoxin level Stopping digoxin as above.   I would like to see him back in 3 weeks to check his rhythm.  He would like to follow-up in refill after that visit.   Medication Adjustments/Labs and Tests Ordered: Current medicines are reviewed at length with the patient today.  Concerns regarding medicines are outlined above.  Orders Placed This Encounter  Procedures  . Basic metabolic panel  . Digoxin level  . EKG 12-Lead   No orders of the defined types were placed in this encounter.   Signed, Ledora Bottcher, PA  08/18/2018 3:52 PM    [facility]

## 2018-08-18 ENCOUNTER — Ambulatory Visit: Payer: BLUE CROSS/BLUE SHIELD | Admitting: Physician Assistant

## 2018-08-18 ENCOUNTER — Encounter: Payer: Self-pay | Admitting: Physician Assistant

## 2018-08-18 VITALS — BP 122/42 | HR 56 | Ht 71.0 in | Wt 268.0 lb

## 2018-08-18 DIAGNOSIS — I4819 Other persistent atrial fibrillation: Secondary | ICD-10-CM

## 2018-08-18 DIAGNOSIS — I10 Essential (primary) hypertension: Secondary | ICD-10-CM

## 2018-08-18 DIAGNOSIS — Z954 Presence of other heart-valve replacement: Secondary | ICD-10-CM | POA: Diagnosis not present

## 2018-08-18 DIAGNOSIS — I4892 Unspecified atrial flutter: Secondary | ICD-10-CM | POA: Diagnosis not present

## 2018-08-18 DIAGNOSIS — Z79899 Other long term (current) drug therapy: Secondary | ICD-10-CM

## 2018-08-18 DIAGNOSIS — I481 Persistent atrial fibrillation: Secondary | ICD-10-CM

## 2018-08-18 NOTE — Patient Instructions (Addendum)
Medication Instructions:  1. STOP DIGOXIN.  If you need a refill on your cardiac medications before your next appointment, please call your pharmacy.  Labwork: TODAY: BMET, DIGOXIN LEVEL  Testing/Procedures: None Ordered  Follow-Up: Your physician wants you to follow-up with ANGELA DUKE, PA ON October 14TH AT 3 PM. PLEASE ARRIVE 15 MINUTES PRIOR TO YOUR APPOINTMENT TIME.     Thank you for choosing [facility] at [facility]!!

## 2018-08-19 LAB — DIGOXIN LEVEL: Digoxin, Serum: 0.8 ng/mL (ref 0.5–0.9)

## 2018-08-20 ENCOUNTER — Telehealth: Payer: Self-pay | Admitting: Pulmonary Disease

## 2018-08-20 ENCOUNTER — Telehealth: Payer: Self-pay | Admitting: Physician Assistant

## 2018-08-20 NOTE — Telephone Encounter (Signed)
Noted. Thanks.

## 2018-08-20 NOTE — Telephone Encounter (Signed)
Spoke with pt. Pt sts that he has d/c digoxin as instructed. Adv pt of lab results and Doreene Adas, PA recommendation.  Notes recorded by Ledora Bottcher, PA on 08/19/2018 at 1:28 PM EDT Your digoxin level is elevated. Please discontinue as we discussed. Please keep your follow up appt. Call if you start having palpitations or other symptoms  Pt verbalized understanding to the  instructions given.

## 2018-08-20 NOTE — Telephone Encounter (Signed)
Dr. Vaughan Browner please advise, will patient need to be scheduled with a sleep doctor, thank you.

## 2018-08-20 NOTE — Telephone Encounter (Signed)
New Message:    Patient is calling for a medication change but patient is not sure of the medication

## 2018-08-21 NOTE — Telephone Encounter (Signed)
I have called the patient and left a detailed message to see why the spilt night has not been schedule and also why he has followed up with doctor Mannam.  Patient will need to schedule a split night and a follow appt.

## 2018-08-21 NOTE — Telephone Encounter (Signed)
No. We ordered a split night sleep study not a referral for office visit He will need to complete the test and follow with me back in clinic after PFTs

## 2018-08-22 NOTE — Telephone Encounter (Signed)
Attempted to call pt. I did not receive an answer. I have left a message for pt to return our call.  

## 2018-08-25 ENCOUNTER — Ambulatory Visit (INDEPENDENT_AMBULATORY_CARE_PROVIDER_SITE_OTHER): Payer: BLUE CROSS/BLUE SHIELD | Admitting: *Deleted

## 2018-08-25 DIAGNOSIS — Z5181 Encounter for therapeutic drug level monitoring: Secondary | ICD-10-CM | POA: Diagnosis not present

## 2018-08-25 DIAGNOSIS — Z954 Presence of other heart-valve replacement: Secondary | ICD-10-CM | POA: Diagnosis not present

## 2018-08-25 DIAGNOSIS — I4891 Unspecified atrial fibrillation: Secondary | ICD-10-CM | POA: Diagnosis not present

## 2018-08-25 LAB — POCT INR: INR: 2.7 (ref 2.0–3.0)

## 2018-08-25 NOTE — Patient Instructions (Signed)
Continue coumadin 1/2 tablet (2mg ) daily except 1 tablet (4mg ) on Mondays, Wednesdays and Fridays  Recheck in 2 weeks

## 2018-08-25 NOTE — Telephone Encounter (Signed)
Attempted to call pt. I did not receive an answer. I have left a message for pt to return our call.  

## 2018-08-26 NOTE — Telephone Encounter (Signed)
We have attempted to contact pt several times with no success or call back from pt. Per triage protocol, message will be closed.  

## 2018-08-27 ENCOUNTER — Other Ambulatory Visit: Payer: Self-pay | Admitting: Osteopathic Medicine

## 2018-08-27 NOTE — Telephone Encounter (Signed)
Seen by [name], DO at [facility] not Primary Care at [facility].   This came to [facility] which does not cover Primary Care at [facility].

## 2018-09-04 ENCOUNTER — Encounter: Payer: Self-pay | Admitting: Osteopathic Medicine

## 2018-09-04 ENCOUNTER — Ambulatory Visit (INDEPENDENT_AMBULATORY_CARE_PROVIDER_SITE_OTHER): Payer: BLUE CROSS/BLUE SHIELD | Admitting: Osteopathic Medicine

## 2018-09-04 DIAGNOSIS — I509 Heart failure, unspecified: Secondary | ICD-10-CM | POA: Diagnosis not present

## 2018-09-04 DIAGNOSIS — Z952 Presence of prosthetic heart valve: Secondary | ICD-10-CM

## 2018-09-04 DIAGNOSIS — J449 Chronic obstructive pulmonary disease, unspecified: Secondary | ICD-10-CM | POA: Diagnosis not present

## 2018-09-04 NOTE — Progress Notes (Signed)
HPI: Jeffrey Larson is a 62 y.o. male who  has a past medical history of Chronic combined systolic and diastolic congestive heart failure ([place]), COPD with chronic bronchitis ([place]), Coronary artery disease, Essential hypertension, benign, History of pneumonia, Hyperlipidemia, Longstanding persistent atrial fibrillation, Mitral stenosis with regurgitation, Myocardial infarction [facility]), Pulmonary hypertension ([place]), Renal disorder, S/P Maze operation for atrial fibrillation (07/18/2018), S/P mitral valve replacement with metallic valve (1/93/7902), and S/P TVR (tricuspid valve repair) (07/18/2018).  he presents to [facility] today, 09/05/18,  for chief complaint of:  Follow-up status post mitral valve replacement, recheck COPD  Following with cardiology, recent mitral valve replacement, patient states he is feeling really energetic and is recovering well from surgery.  No complaints today.  Compliant with Trelegy, has not had need to use rescue inhaler.  Immunization History  Administered Date(s) Administered  . Influenza,inj,Quad PF,6+ Mos 11/08/2016, 08/26/2017, 07/31/2018  . Tdap 07/20/2015     Past medical history, surgical history, and family history reviewed.  Current medication list and allergy/intolerance information reviewed.   (See remainder of HPI, ROS, Phys Exam below)    ASSESSMENT/PLAN: Diagnoses of Congestive heart failure, unspecified HF chronicity, unspecified heart failure type ([place]), Mitral valve replaced, and COPD with chronic bronchitis ([place]) were pertinent to this visit.  Chronic medical conditions are stable, patient is doing well status post mitral valve replacement.  Can defer care to specialists and see me as needed and for routine preventive care     Follow-up plan: Return in about 3 months (around 12/05/2018) for [place] - SEE ME SOONER IF NEEDED  .                       ############################################ ############################################ ############################################ ############################################    Outpatient Encounter Medications as of 09/04/2018  Medication Sig  . acetaminophen (TYLENOL) 500 MG tablet Take 1,000 mg by mouth 2 (two) times daily.  . Albuterol Sulfate (PROAIR RESPICLICK) 409 (90 Base) MCG/ACT AEPB Inhale 1 puff into the lungs every 6 (six) hours as needed (sob).  . AMBULATORY NON FORMULARY MEDICATION Medication Name: Glucometer, lancets and strip to test every other day. Dx: Diabetes type 2, E11.9. 100 strip and 100 lancets  . aspirin 81 MG tablet Take 81 mg by mouth daily.  Marland Kitchen atorvastatin (LIPITOR) 40 MG tablet Take 40 mg by mouth every evening.   . Fluticasone-Umeclidin-Vilant (TRELEGY ELLIPTA) 100-62.5-25 MCG/INH AEPB Inhale 1 puff into the lungs daily.  . metoprolol succinate (TOPROL-XL) 50 MG 24 hr tablet Take 1 tablet (50 mg total) by mouth daily. Take with or immediately following a meal.  . Omega-3 Fatty Acids (FISH OIL) 1200 MG CAPS Take 1,200 mg by mouth daily.  Marland Kitchen oxymetazoline (AFRIN) 0.05 % nasal spray Place 1 spray into both nostrils daily.  . sacubitril-valsartan (ENTRESTO) 24-26 MG Take 1 tablet by mouth 2 (two) times daily.  Marland Kitchen spironolactone (ALDACTONE) 25 MG tablet Take 25 mg by mouth daily.  Marland Kitchen torsemide (DEMADEX) 20 MG tablet Take 2 tablets (40 mg total) by mouth daily.  . traMADol (ULTRAM) 50 MG tablet Take 1 tablet (50 mg total) by mouth every 6 (six) hours as needed for moderate pain.  Marland Kitchen warfarin (COUMADIN) 4 MG tablet Take 4 mg by mouth as directed.  . nitroGLYCERIN (NITROSTAT) 0.4 MG SL tablet Place 1 tablet (0.4 mg total) under the tongue every 5 (five) minutes x 3 doses as needed for chest pain.   No facility-administered encounter medications  on file as of 09/04/2018.    Allergies  Allergen Reactions  . Ramipril  Cough    cough  . Percocet  [Oxycodone-Acetaminophen]       Review of Systems:  Constitutional: No recent illness  HEENT: No  headache, no vision change  Cardiac: No  chest pain, No  pressure, No palpitations  Respiratory:  No  shortness of breath. No  Cough  Gastrointestinal: No  abdominal pain, no change on bowel habits  Musculoskeletal: No new myalgia/arthralgia  Skin: No  Rash  Hem/Onc: No  easy bruising/bleeding, No  abnormal lumps/bumps  Neurologic: No  weakness, No  Dizziness  Psychiatric: No  concerns with depression, No  concerns with anxiety  Exam:  BP 97/62 (BP Location: Left Arm, Patient Position: Sitting, Cuff Size: Large)   Pulse 74   Temp 98.2 F (36.8 C) (Oral)   Wt 270 lb 3.2 oz (122.6 kg)   BMI 37.69 kg/m   Constitutional: VS see above. General Appearance: alert, well-developed, well-nourished, NAD  Eyes: Normal lids and conjunctive, non-icteric sclera  Ears, Nose, Mouth, Throat: MMM, Normal external inspection ears/nares/mouth/lips/gums.  Neck: No masses, trachea midline.   Respiratory: Normal respiratory effort. no wheeze, no rhonchi, no rales  Cardiovascular: S1/S2 normal, mitral valve artificial click is audible, no rub/gallop auscultated. RRR.   Musculoskeletal: Gait normal. Symmetric and independent movement of all extremities  Neurological: Normal balance/coordination. No tremor.  Skin: warm, dry, intact.   Psychiatric: Normal judgment/insight. Normal mood and affect. Oriented x3.   Visit summary with medication list and pertinent instructions was printed for patient to review, advised to alert [place] if any changes needed. All questions at time of visit were answered - patient instructed to contact office with any additional concerns. ER/RTC precautions were reviewed with the patient and understanding verbalized.   Follow-up plan: Return in about 3 months (around 12/05/2018) for [place] - SEE ME SOONER IF NEEDED  .  Note: Total time spent 15 minutes, greater than 50% of the visit was spent face-to-face counseling and coordinating care for the following: Diagnoses of Congestive heart failure, unspecified HF chronicity, unspecified heart failure type ([place]), Mitral valve replaced, and COPD with chronic bronchitis ([place]) were pertinent to this visit.Marland Kitchen  Please note: voice recognition software was used to produce this document, and typos may escape review. Please contact Dr. Sheppard Coil for any needed clarifications.

## 2018-09-05 ENCOUNTER — Encounter: Payer: Self-pay | Admitting: Osteopathic Medicine

## 2018-09-07 NOTE — Progress Notes (Signed)
Cardiology Office Note:    Date:  09/08/2018   ID:  Jeffrey Larson, DOB 1956-05-16, MRN [id number]  PCP:  [name], DO  Cardiologist:  [name], MD   Referring MD: [name], DO   Chief Complaint  Patient presents with  . Follow-up    3 months    History of Present Illness:    Jeffrey Larson is a 62 y.o. male with a hx of obesity, COPD, chronic A. fib, CAD, rheumatic mitral stenosis, TR, and chronic combined systolic and diastolic heart failure.  He has a history of PCI to his RCA, LAD, left circumflex.  He presented to the ER in July 2019 with shortness of breath.  Right and left heart catheterization on 06/10/2018 revealed severe mitral valve calcification, severe pulmonary hypertension, and moderate disease.  Advanced heart failure team was consulted, he was diuresed and discharged with TCTS follow-up.  He ultimately underwent surgery on 07/18/2018 with mechanical mitral valve replacement, TR band, maze, LAA closure.  Postoperative course was complicated by HCAP, right-sided heart failure requiring aggressive diuresis with Lasix drip and titration to heart failure medications by the advanced heart failure team, and PTX requiring CT insertion.  He was ultimately discharged on 07/31/2018.  I saw him 08/18/18 for hospital follow up. He was doing well on coumadin (checked in [place]) for his mechanical mitral valve. EKG was 4:1 flutter. He was doing well on 40 mg torsemide daily, toprol, entresto, and spiro. Digoxin was D/C'ed.   He presents today for follow up. He is doing well and continues to walk outside. Overall he feels well. He occasionally gets lightheaded when standing from a sitting position, but this dissipates quickly and is not bothering him. He denies chest pain, SOB, lower extremity swelling, and syncope. He denies palpitations. EKG today with 4:1 atrial flutter, rate controlled.  He sometimes has some DOE when walking up hills, but states he thinks  this is due to his obesity. His main concern is his insurance stopped paying for his entresto.  He is doing well on coumadin for his mechanical mitral valve. He had some dietary indiscretion (restaurant eating) when family was in town. He is very compliant on his medications. NYHA class II-III, but doing very well on entresto.    Past Medical History:  Diagnosis Date  . Chronic combined systolic and diastolic congestive heart failure ([place])   . COPD with chronic bronchitis ([place])   . Coronary artery disease   . Essential hypertension, benign    Ramipril to losartan Sept 2015   . History of pneumonia    RML on CXR 07/20/14  . Hyperlipidemia   . Longstanding persistent atrial fibrillation   . Mitral stenosis with regurgitation   . Myocardial infarction ([place])   . Pulmonary hypertension ([place])   . Renal disorder    history kidney stone  . S/P Maze operation for atrial fibrillation 07/18/2018   Complete bilateral atrial lesion set using bipolar radiofrequency and cryothermy ablation with clipping of LA appendage  . S/P mitral valve replacement with metallic valve 2/77/4128   Sorin Carbomedics Optiform bileaflet mechanical valve, size 33 mm  . S/P TVR (tricuspid valve repair) 07/18/2018   Edwards mc3 ring annuloplasty, size 30 mm    Past Surgical History:  Procedure Laterality Date  . APPENDECTOMY    . CARDIOVASCULAR STRESS TEST  03/2014   Borderline reversible ischemic changes at the apex.  Normal LV contractility/EF 52%.  . CORONARY STENT PLACEMENT  7/12013  . LEFT  HEART CATHETERIZATION WITH CORONARY ANGIOGRAM N/A 06/12/2012   Procedure: LEFT HEART CATHETERIZATION WITH CORONARY ANGIOGRAM;  Surgeon: Clent Demark, MD;  Location: [facility] CATH LAB;  Service: Cardiovascular;  Laterality: N/A;  . MAZE N/A 07/18/2018   Procedure: MAZE;  Surgeon: Rexene Alberts, MD;  Location: [facility];  Service: Open Heart Surgery;  Laterality: N/A;  . MITRAL VALVE REPLACEMENT N/A 07/18/2018   Procedure: MITRAL VALVE  (MV) REPLACEMENT WITH CARBOMEDICS OPTIFORM MITRAL VALVE SIZE 33.;  Surgeon: Rexene Alberts, MD;  Location: [facility];  Service: Open Heart Surgery;  Laterality: N/A;  . PERCUTANEOUS CORONARY STENT INTERVENTION (PCI-S) N/A 06/17/2012   Procedure: PERCUTANEOUS CORONARY STENT INTERVENTION (PCI-S);  Surgeon: Clent Demark, MD;  Location: [facility] CATH LAB;  Service: Cardiovascular;  Laterality: N/A;  . RIGHT/LEFT HEART CATH AND CORONARY ANGIOGRAPHY N/A 06/10/2018   Procedure: RIGHT/LEFT HEART CATH AND CORONARY ANGIOGRAPHY;  Surgeon: Dixie Dials, MD;  Location: [facility] CV LAB;  Service: Cardiovascular;  Laterality: N/A;  . TEE WITHOUT CARDIOVERSION N/A 06/10/2018   Procedure: TRANSESOPHAGEAL ECHOCARDIOGRAM (TEE) with bubble study;  Surgeon: Dixie Dials, MD;  Location: [facility] ENDOSCOPY;  Service: Cardiovascular;  Laterality: N/A;  . TEE WITHOUT CARDIOVERSION N/A 07/18/2018   Procedure: TRANSESOPHAGEAL ECHOCARDIOGRAM (TEE);  Surgeon: Rexene Alberts, MD;  Location: [place];  Service: Open Heart Surgery;  Laterality: N/A;  . TRICUSPID VALVE REPLACEMENT N/A 07/18/2018   Procedure: TRICUSPID VALVE REPAIR WITH EDWARDS MC3 TRICUSPID ANNULOPLASTY RING SIZE 30.;  Surgeon: Rexene Alberts, MD;  Location: [place];  Service: Open Heart Surgery;  Laterality: N/A;    Current Medications: Current Meds  Medication Sig  . acetaminophen (TYLENOL) 500 MG tablet Take 1,000 mg by mouth 2 (two) times daily.  . Albuterol Sulfate (PROAIR RESPICLICK) 765 (90 Base) MCG/ACT AEPB Inhale 1 puff into the lungs every 6 (six) hours as needed (sob).  . AMBULATORY NON FORMULARY MEDICATION Medication Name: Glucometer, lancets and strip to test every other day. Dx: Diabetes type 2, E11.9. 100 strip and 100 lancets  . aspirin 81 MG tablet Take 81 mg by mouth daily.  Marland Kitchen atorvastatin (LIPITOR) 40 MG tablet Take 40 mg by mouth every evening.   . Fluticasone-Umeclidin-Vilant (TRELEGY ELLIPTA) 100-62.5-25 MCG/INH AEPB Inhale 1 puff into the lungs  daily.  . metoprolol succinate (TOPROL-XL) 50 MG 24 hr tablet Take 1 tablet (50 mg total) by mouth daily. Take with or immediately following a meal.  . Omega-3 Fatty Acids (FISH OIL) 1200 MG CAPS Take 1,200 mg by mouth daily.  Marland Kitchen oxymetazoline (AFRIN) 0.05 % nasal spray Place 1 spray into both nostrils daily.  . sacubitril-valsartan (ENTRESTO) 24-26 MG Take 1 tablet by mouth 2 (two) times daily.  Marland Kitchen spironolactone (ALDACTONE) 25 MG tablet Take 25 mg by mouth daily.  Marland Kitchen torsemide (DEMADEX) 20 MG tablet Take 2 tablets (40 mg total) by mouth daily.  . traMADol (ULTRAM) 50 MG tablet Take 1 tablet (50 mg total) by mouth every 6 (six) hours as needed for moderate pain.  Marland Kitchen warfarin (COUMADIN) 4 MG tablet Take 4 mg by mouth as directed.  . [DISCONTINUED] sacubitril-valsartan (ENTRESTO) 24-26 MG Take 1 tablet by mouth 2 (two) times daily.     Allergies:   Ramipril and Percocet  [oxycodone-acetaminophen]   Social History   Socioeconomic History  . Marital status: Married    Spouse name: Not on file  . Number of children: Not on file  . Years of education: Not on file  . Highest education level:  Not on file  Occupational History  . Not on file  Social Needs  . Financial resource strain: Not on file  . Food insecurity:    Worry: Not on file    Inability: Not on file  . Transportation needs:    Medical: Not on file    Non-medical: Not on file  Tobacco Use  . Smoking status: Former Smoker    Packs/day: 2.00    Years: 15.00    Pack years: 30.00    Types: Cigarettes    Last attempt to quit: 11/26/1996    Years since quitting: 21.7  . Smokeless tobacco: Never Used  Substance and Sexual Activity  . Alcohol use: No  . Drug use: No  . Sexual activity: Yes  Lifestyle  . Physical activity:    Days per week: Not on file    Minutes per session: Not on file  . Stress: Not on file  Relationships  . Social connections:    Talks on phone: Not on file    Gets together: Not on file    Attends  religious service: Not on file    Active member of club or organization: Not on file    Attends meetings of clubs or organizations: Not on file    Relationship status: Not on file  Other Topics Concern  . Not on file  Social History Narrative  . Not on file     Family History: The patient's family history includes Heart disease in his father and mother.  ROS:   Please see the history of present illness.     All other systems reviewed and are negative.  EKGs/Labs/Other Studies Reviewed:    The following studies were reviewed today:  Right and left heart cath 06/10/18:  Prox RCA lesion is 10% stenosed.  Mid RCA lesion is 50% stenosed.  Post Atrio lesion is 30% stenosed.  Ost LAD to Prox LAD lesion is 20% stenosed.  Prox Cx lesion is 20% stenosed.  Hemodynamic findings consistent with severe pulmonary hypertension.  LV end diastolic pressure is normal.   Severe MV calcification. MV area 0.84 cm2. TEE and CVTS consult for MV surgery. Life-style modification for CAD.   EKG:  EKG is  ordered today.  The ekg ordered today and appears to be in 3:1 flutter, rate controlled  Recent Labs: 07/28/2018: ALT 34; B Natriuretic Peptide 159.7 07/30/2018: Magnesium 2.5 07/31/2018: Hemoglobin 10.7; Platelets 462 08/08/2018: BUN 30; Creatinine, Ser 1.49; Potassium 4.3; Sodium 137  Recent Lipid Panel No results found for: CHOL, TRIG, HDL, CHOLHDL, VLDL, LDLCALC, LDLDIRECT  Physical Exam:    VS:  BP 112/68 (BP Location: Left Arm, Patient Position: Sitting, Cuff Size: Normal)   Pulse 88   Ht 5\' 11"  (1.803 m)   Wt 276 lb (125.2 kg)   BMI 38.49 kg/m     Wt Readings from Last 3 Encounters:  09/08/18 276 lb (125.2 kg)  09/04/18 270 lb 3.2 oz (122.6 kg)  08/18/18 268 lb (121.6 kg)     GEN: Well nourished, well developed in no acute distress HEENT: Normal NECK: No JVD; No carotid bruits CARDIAC: irregular rhythm, regular rate, no murmurs, rubs, gallops RESPIRATORY:  Clear to  auscultation without rales, wheezing or rhonchi  ABDOMEN: Soft, non-tender, non-distended MUSCULOSKELETAL: trace B LE edema with chronic erythema; No deformity  SKIN: Warm and dry NEUROLOGIC:  Alert and oriented x 3 PSYCHIATRIC:  Normal affect   ASSESSMENT:    1. Chronic combined systolic and diastolic congestive heart  failure ([place])   2. Coronary artery disease involving native coronary artery of native heart without angina pectoris   3. Mitral stenosis with insufficiency, unspecified etiology   4. S/P mitral valve replacement with metallic valve + tricuspid valve repair + maze procedure   5. Persistent atrial fibrillation    PLAN:    In order of problems listed above:  Chronic combined systolic and diastolic congestive heart failure ([place]) He admitted to dietary indiscretion when family was visiting. He is compliant on all medications. He is doing well. He weighs daily. He is euvolemic today. He was given 2 weeks of samples, PAN foundation information, and a coupon card. He will call our office if a prior authorization is needed.    Coronary artery disease involving native coronary artery of native heart without angina pectoris Mitral stenosis with insufficiency, unspecified etiology S/P mitral valve replacement with metallic valve + tricuspid valve repair + maze procedure Persistent atrial fibrillation He continues to be asymptomatic. He is walking almost daily. No palpitations and no syncope. He is compliant on coumadin.    He wishes to follow up here and will now be a Dr. Sallyanne Kuster patient. Follow up in 3 months.   Medication Adjustments/Labs and Tests Ordered: Current medicines are reviewed at length with the patient today.  Concerns regarding medicines are outlined above.  Orders Placed This Encounter  Procedures  . Basic metabolic panel  . EKG 12-Lead   Meds ordered this encounter  Medications  . sacubitril-valsartan (ENTRESTO) 24-26 MG    Sig: Take 1 tablet by mouth 2  (two) times daily.    Dispense:  180 tablet    Refill:  1    Signed, Ledora Bottcher, [place]  09/08/2018 3:44 PM    [facility]

## 2018-09-08 ENCOUNTER — Ambulatory Visit: Payer: BLUE CROSS/BLUE SHIELD | Admitting: Thoracic Surgery (Cardiothoracic Vascular Surgery)

## 2018-09-08 ENCOUNTER — Ambulatory Visit: Payer: BLUE CROSS/BLUE SHIELD | Admitting: Physician Assistant

## 2018-09-08 ENCOUNTER — Ambulatory Visit (INDEPENDENT_AMBULATORY_CARE_PROVIDER_SITE_OTHER): Payer: BLUE CROSS/BLUE SHIELD | Admitting: *Deleted

## 2018-09-08 ENCOUNTER — Encounter: Payer: Self-pay | Admitting: Physician Assistant

## 2018-09-08 VITALS — BP 112/68 | HR 88 | Ht 71.0 in | Wt 276.0 lb

## 2018-09-08 DIAGNOSIS — Z5181 Encounter for therapeutic drug level monitoring: Secondary | ICD-10-CM

## 2018-09-08 DIAGNOSIS — I5042 Chronic combined systolic (congestive) and diastolic (congestive) heart failure: Secondary | ICD-10-CM

## 2018-09-08 DIAGNOSIS — I4819 Other persistent atrial fibrillation: Secondary | ICD-10-CM

## 2018-09-08 DIAGNOSIS — Z954 Presence of other heart-valve replacement: Secondary | ICD-10-CM

## 2018-09-08 DIAGNOSIS — I052 Rheumatic mitral stenosis with insufficiency: Secondary | ICD-10-CM | POA: Diagnosis not present

## 2018-09-08 DIAGNOSIS — I2510 Atherosclerotic heart disease of native coronary artery without angina pectoris: Secondary | ICD-10-CM | POA: Diagnosis not present

## 2018-09-08 DIAGNOSIS — I4891 Unspecified atrial fibrillation: Secondary | ICD-10-CM | POA: Diagnosis not present

## 2018-09-08 LAB — POCT INR: INR: 2.4 (ref 2.0–3.0)

## 2018-09-08 MED ORDER — SACUBITRIL-VALSARTAN 24-26 MG PO TABS: 1.0000 | ORAL_TABLET | Freq: Two times a day (BID) | ORAL | 1 refills | Status: DC

## 2018-09-08 NOTE — Patient Instructions (Addendum)
Medication Instructions:  No changes.  If you need a refill on your cardiac medications before your next appointment, please call your pharmacy.   Lab work: BMET If you have labs (blood work) drawn today and your tests are completely normal, you will receive your results only by: Marland Kitchen MyChart Message (if you have MyChart) OR . A paper copy in the mail If you have any lab test that is abnormal or we need to change your treatment, we will call you to review the results.  Testing/Procedures: None Ordered  Follow-Up: At [facility], you and your health needs are our priority.  As part of our continuing mission to provide you with exceptional heart care, we have created designated Provider Care Teams.  These Care Teams include your primary Cardiologist (physician) and Advanced Practice Providers (APPs -  Physician Assistants and Nurse Practitioners) who all work together to provide you with the care you need, when you need it. You will need a follow up appointment in 3 months.  Please call our office 2 months in advance to schedule this appointment.  You may see [name], MD or one of the following Advanced Practice Providers on your designated Care Team: [place], [place] . Fabian Sharp, PA-C  Any Other Special Instructions Will Be Listed Below (If Applicable). N/A

## 2018-09-08 NOTE — Patient Instructions (Signed)
Increase coumadin to 1 tablet daily except 1/2 tablet on Sundays, Tuesdays and Thursdays Recheck in 3 weeks

## 2018-09-08 NOTE — Progress Notes (Signed)
Thanks MCr 

## 2018-09-09 LAB — BASIC METABOLIC PANEL
BUN/Creatinine Ratio: 22 (ref 10–24)
BUN: 20 mg/dL (ref 8–27)
CALCIUM: 9.4 mg/dL (ref 8.6–10.2)
CHLORIDE: 102 mmol/L (ref 96–106)
CO2: 23 mmol/L (ref 20–29)
Creatinine, Ser: 0.9 mg/dL (ref 0.76–1.27)
GFR calc Af Amer: 106 mL/min/{1.73_m2} (ref >59)
GFR, EST NON AFRICAN AMERICAN: 92 mL/min/{1.73_m2} (ref >59)
GLUCOSE: 73 mg/dL (ref 65–99)
Potassium: 4.8 mmol/L (ref 3.5–5.2)
SODIUM: 141 mmol/L (ref 134–144)

## 2018-09-11 ENCOUNTER — Telehealth: Payer: Self-pay | Admitting: Physician Assistant

## 2018-09-11 NOTE — Telephone Encounter (Signed)
New Message      [facility] Pre-operative Risk Assessment    Request for surgical clearance:  1. What type of surgery is being performed? Three teet extracted   2. When is this surgery scheduled? TBD   3. What type of clearance is required (medical clearance vs. Pharmacy clearance to hold med vs. Both)? Both  4. Are there any medications that need to be held prior to surgery and how long? Stop Coumadin physician recommendation    5. Practice name and name of physician performing surgery?Abbie S. Luan Pulling DDS   6. What is your office phone number [phone]    7.   What is your office fax number [phone]  8.   Anesthesia type (None, local, MAC, general) ? [place]   Jeffrey Larson 09/11/2018, 3:41 PM  _________________________________________________________________   (provider comments below)

## 2018-09-15 NOTE — Telephone Encounter (Signed)
Follow up     Almyra Free is calling from Dr. Luan Pulling office to find out about clearance for having extractions.

## 2018-09-16 MED ORDER — AMOXICILLIN 500 MG PO CAPS: ORAL_CAPSULE | ORAL | 3 refills | Status: DC

## 2018-09-16 NOTE — Telephone Encounter (Signed)
   Primary Cardiologist: [name], MD  Chart reviewed as part of pre-operative protocol coverage. Patient was contacted 09/16/2018 in reference to pre-operative risk assessment for pending surgery as outlined below.  Jeffrey Larson was last seen on 09/08/2018 by Fabian Sharp. He was doing well at that time.  Therefore, based on ACC/AHA guidelines, the patient would be at acceptable risk for the planned procedure without further cardiovascular testing.   I will route this recommendation to the requesting party via Epic fax function and remove from pre-op pool.  Please call with questions.  Rosaria Ferries, PA-C 09/16/2018, 3:46 PM

## 2018-09-16 NOTE — Telephone Encounter (Signed)
Pt takes warfarin for mechanical MVR and afib with higher INR goal of 2.5 - 3.5. Pt will require Lovenox bridging if he needs to hold warfarin for dental extractions. Most recent INR was a bit low for pt at 2.4. Would discuss with dentist office to see if there is a certain INR they are comfortable performing extractions under. Pt is followed by Edrick Oh, RN in [place] office - she can help coordinate Lovenox bridge if they require pt to hold Coumadin pre op (may be able to hold warfarin for shorter duration ~3 days or so which would shorten length of Lovenox bridge as well).  He will also require amoxicillin 2g 1 hour prior to dental work due to history of MVR. Please advise pt that rx has been sent to [place] on [address].

## 2018-09-16 NOTE — Telephone Encounter (Signed)
   Primary Cardiologist: [name], MD  See note from pharmacist regarding the need to cross cover with Lovenox due to his mechanical mitral valve.  Spoke with Otila Kluver, Dr. Luan Pulling office manager.  There are 2 options available to the patient. 1) he can stop the Coumadin, to the Lovenox bridging which can be arranged by Lattie Haw with the [place] office, and continue the Lovenox until his Coumadin level is therapeutic postprocedure. 2) he can agree to get the procedure done without stopping the Coumadin, being aware that the bleeding risk is higher.  Otila Kluver is willing to call the patient and discussed both options with him.  If he decides to pursue the Lovenox bridging, he will need to contact Lattie Haw at the [place] office.  Please call with questions.  Rosaria Ferries, PA-C 09/16/2018, 4:53 PM

## 2018-09-24 ENCOUNTER — Telehealth: Payer: Self-pay

## 2018-09-24 ENCOUNTER — Other Ambulatory Visit: Payer: Self-pay

## 2018-09-24 MED ORDER — FLUTICASONE-UMECLIDIN-VILANT 100-62.5-25 MCG/INH IN AEPB: 1.0000 | INHALATION_SPRAY | Freq: Every day | RESPIRATORY_TRACT | 5 refills | Status: DC

## 2018-09-24 NOTE — Telephone Encounter (Signed)
Disregard opened in error °

## 2018-09-29 ENCOUNTER — Ambulatory Visit (INDEPENDENT_AMBULATORY_CARE_PROVIDER_SITE_OTHER): Payer: BLUE CROSS/BLUE SHIELD | Admitting: *Deleted

## 2018-09-29 DIAGNOSIS — I4891 Unspecified atrial fibrillation: Secondary | ICD-10-CM

## 2018-09-29 DIAGNOSIS — Z5181 Encounter for therapeutic drug level monitoring: Secondary | ICD-10-CM | POA: Diagnosis not present

## 2018-09-29 DIAGNOSIS — Z954 Presence of other heart-valve replacement: Secondary | ICD-10-CM

## 2018-09-29 LAB — POCT INR: INR: 1.5 — AB (ref 2.0–3.0)

## 2018-09-29 NOTE — Patient Instructions (Signed)
Increase coumadin to 1 tablet daily except 1/2 tablet on Sundays Recheck in 1 week

## 2018-10-02 ENCOUNTER — Encounter (HOSPITAL_COMMUNITY): Payer: BLUE CROSS/BLUE SHIELD | Admitting: Internal Medicine

## 2018-10-06 ENCOUNTER — Encounter (HOSPITAL_COMMUNITY): Payer: BLUE CROSS/BLUE SHIELD | Admitting: Internal Medicine

## 2018-10-08 ENCOUNTER — Ambulatory Visit (INDEPENDENT_AMBULATORY_CARE_PROVIDER_SITE_OTHER): Payer: BLUE CROSS/BLUE SHIELD | Admitting: *Deleted

## 2018-10-08 DIAGNOSIS — Z954 Presence of other heart-valve replacement: Secondary | ICD-10-CM

## 2018-10-08 DIAGNOSIS — I4891 Unspecified atrial fibrillation: Secondary | ICD-10-CM

## 2018-10-08 DIAGNOSIS — Z5181 Encounter for therapeutic drug level monitoring: Secondary | ICD-10-CM | POA: Diagnosis not present

## 2018-10-08 LAB — POCT INR: INR: 3.3 — AB (ref 2.0–3.0)

## 2018-10-08 NOTE — Patient Instructions (Signed)
Continue coumadin 1 tablet daily except 1/2 tablet on Sundays Recheck in 2 weeks

## 2018-10-20 ENCOUNTER — Ambulatory Visit: Payer: BLUE CROSS/BLUE SHIELD | Admitting: Thoracic Surgery (Cardiothoracic Vascular Surgery)

## 2018-10-20 ENCOUNTER — Other Ambulatory Visit: Payer: Self-pay

## 2018-10-20 ENCOUNTER — Encounter: Payer: Self-pay | Admitting: Thoracic Surgery (Cardiothoracic Vascular Surgery)

## 2018-10-20 VITALS — BP 106/58 | HR 93 | Resp 18 | Ht 71.0 in | Wt 272.0 lb

## 2018-10-20 DIAGNOSIS — Z9889 Other specified postprocedural states: Secondary | ICD-10-CM | POA: Diagnosis not present

## 2018-10-20 DIAGNOSIS — Z8679 Personal history of other diseases of the circulatory system: Secondary | ICD-10-CM

## 2018-10-20 DIAGNOSIS — Z954 Presence of other heart-valve replacement: Secondary | ICD-10-CM

## 2018-10-20 NOTE — Progress Notes (Signed)
Does he have follow-up? Echo first, then visit please. MCr

## 2018-10-20 NOTE — Patient Instructions (Signed)

## 2018-10-20 NOTE — Progress Notes (Signed)
[address]       [place] [address]             [phone]     CARDIOTHORACIC SURGERY OFFICE NOTE  Referring Provider Clotilde Dieter, MD Advanced Heart Failure Cardiologist is Bensimhon, Shaune Pascal, MD   Primary Cardiologist is [name], MD PCP is [name], DO   HPI:  Patient is a 62 year old morbidly obese male with history of coronary artery disease status post acute myocardial infarction in 2013 treated with multivessel PCI and stenting, chronic combined systolic and diastolic congestive heart failure, long-standing persistent atrial fibrillation, and COPD with chronic bronchitis who returns to the office today for follow-up status post mitral valve replacement using a bileaflet mechanical prosthetic valve, tricuspid valve repair, and Maze procedure on July 18, 2018 for rheumatic heart disease with severe symptomatic mitral stenosis, moderate tricuspid regurgitation, and long-standing persistent atrial fibrillation.  The patient's early postoperative recovery was notable for right-sided heart failure requiring aggressive diuresis and titration of heart failure medications by the advanced heart failure team.  He went back into persistent, rate controlled atrial fibrillation during his hospitalization and was discharged home on warfarin anticoagulation.  He was last seen in follow-up in our office on August 12, 2018 at which time he was doing well.  Since then he has been followed carefully in the cardiology office where most recently he saw Fabian Sharp on September 08, 2018.  Clinically he has done remarkably well and he returns to our office today for routine follow-up.  He states that he feels dramatically better than he did prior to surgery.  He states that his breathing is much better than it has been in years.  He is back working and getting around physically without any significant problems or limitations.  The soreness in his chest has resolved.   He denies any palpitations or dizzy spells.  Weight has been stable on current diuretic therapy.  Overall he is delighted with his progress.   Current Outpatient Medications  Medication Sig Dispense Refill  . acetaminophen (TYLENOL) 500 MG tablet Take 1,000 mg by mouth 2 (two) times daily.    . Albuterol Sulfate (PROAIR RESPICLICK) 494 (90 Base) MCG/ACT AEPB Inhale 1 puff into the lungs every 6 (six) hours as needed (sob). 1 each 1  . AMBULATORY NON FORMULARY MEDICATION Medication Name: Glucometer, lancets and strip to test every other day. Dx: Diabetes type 2, E11.9. 100 strip and 100 lancets 1 Units PRN  . amoxicillin (AMOXIL) 500 MG capsule Take 4 capsules by mouth 1 hour prior to dental work. 4 capsule 3  . aspirin 81 MG tablet Take 81 mg by mouth daily.    Marland Kitchen atorvastatin (LIPITOR) 40 MG tablet Take 40 mg by mouth every evening.   2  . Fluticasone-Umeclidin-Vilant (TRELEGY ELLIPTA) 100-62.5-25 MCG/INH AEPB Inhale 1 puff into the lungs daily. 1 each 5  . metoprolol succinate (TOPROL-XL) 50 MG 24 hr tablet Take 1 tablet (50 mg total) by mouth daily. Take with or immediately following a meal. 30 tablet 3  . Omega-3 Fatty Acids (FISH OIL) 1200 MG CAPS Take 1,200 mg by mouth daily.    Marland Kitchen oxymetazoline (AFRIN) 0.05 % nasal spray Place 1 spray into both nostrils daily.    . sacubitril-valsartan (ENTRESTO) 24-26 MG Take 1 tablet by mouth 2 (two) times daily. 180 tablet 1  . spironolactone (ALDACTONE) 25 MG tablet Take 25 mg by mouth daily.    Marland Kitchen torsemide (  DEMADEX) 20 MG tablet Take 2 tablets (40 mg total) by mouth daily. 60 tablet 11  . traMADol (ULTRAM) 50 MG tablet Take 1 tablet (50 mg total) by mouth every 6 (six) hours as needed for moderate pain. 20 tablet 0  . warfarin (COUMADIN) 4 MG tablet Take 4 mg by mouth as directed.    . nitroGLYCERIN (NITROSTAT) 0.4 MG SL tablet Place 1 tablet (0.4 mg total) under the tongue every 5 (five) minutes x 3 doses as needed for chest pain. 25 tablet 3   No  current facility-administered medications for this visit.       Physical Exam:   BP (!) 106/58 (BP Location: Right Arm, Patient Position: Sitting, Cuff Size: Normal)   Pulse 93   Resp 18   Ht 5\' 11"  (1.803 m)   Wt 272 lb (123.4 kg)   SpO2 97% Comment: RA  BMI 37.94 kg/m   General:  Obese but well-appearing  Chest:   Clear to auscultation  CV:   Irregular rate and rhythm with mechanical heart valve sounds  Incisions:  Completely healed, sternum is stable  Abdomen:  Soft nontender  Extremities:  Warm and well-perfused, mild edema  Diagnostic Tests:  n/a   Impression:  Patient is doing very well approximately 3 months status post mitral valve replacement using a bileaflet mechanical prosthetic valve, tricuspid valve repair, and Maze procedure.  Postoperatively he has gone into rate controlled persistent atrial fibrillation but he has been quite stable on medical therapy.  Early following surgery he had some significant right ventricular dysfunction, but clinically he has done remarkably well.  He has not had a follow-up echocardiogram since hospital discharge.  Plan:  We have not recommended any change the patient's current medications.  I have encouraged the patient to continue to gradually increase his physical activity without any particular limitations at this time.  I have reminded the patient that he will need to continue to monitor his weight on a regular basis and to touch base with the advanced heart failure clinic or cardiology office with any questions regarding adjustment to his diuretic therapy or other medications for heart failure.  The patient has been reminded regarding the importance of dental hygiene and the lifelong need for antibiotic prophylaxis for all dental cleanings and other related invasive procedures.  The patient will continue to follow-up regularly in the advanced heart failure clinic and with Dr. Sallyanne Kuster.  He will return to our office next August,  approximately 1 year following his surgery for routine follow-up and rhythm check.  He will call and return sooner should specific problems or questions arise.  I spent in excess of 15 minutes during the conduct of this office consultation and >50% of this time involved direct face-to-face encounter with the patient for counseling and/or coordination of their care.    Valentina Gu. Roxy Manns, MD 10/20/2018 2:43 PM

## 2018-10-22 ENCOUNTER — Ambulatory Visit (INDEPENDENT_AMBULATORY_CARE_PROVIDER_SITE_OTHER): Payer: BLUE CROSS/BLUE SHIELD | Admitting: *Deleted

## 2018-10-22 DIAGNOSIS — I4891 Unspecified atrial fibrillation: Secondary | ICD-10-CM | POA: Diagnosis not present

## 2018-10-22 DIAGNOSIS — Z954 Presence of other heart-valve replacement: Secondary | ICD-10-CM

## 2018-10-22 DIAGNOSIS — Z5181 Encounter for therapeutic drug level monitoring: Secondary | ICD-10-CM

## 2018-10-22 LAB — POCT INR: INR: 2.1 (ref 2.0–3.0)

## 2018-10-22 NOTE — Patient Instructions (Signed)
Take coumadin 8mg  tonight, take 6mg  tomorrow night then increase dose to 4mg  daily Recheck in 2 weeks

## 2018-11-05 ENCOUNTER — Ambulatory Visit (INDEPENDENT_AMBULATORY_CARE_PROVIDER_SITE_OTHER): Payer: BLUE CROSS/BLUE SHIELD | Admitting: *Deleted

## 2018-11-05 DIAGNOSIS — Z5181 Encounter for therapeutic drug level monitoring: Secondary | ICD-10-CM

## 2018-11-05 DIAGNOSIS — Z954 Presence of other heart-valve replacement: Secondary | ICD-10-CM

## 2018-11-05 DIAGNOSIS — I4891 Unspecified atrial fibrillation: Secondary | ICD-10-CM | POA: Diagnosis not present

## 2018-11-05 LAB — POCT INR: INR: 2.9 (ref 2.0–3.0)

## 2018-11-05 NOTE — Patient Instructions (Signed)
Continue coumadin 4mg  daily Recheck in 2 weeks Pending 3 dental extractions.  Not scheduled yet. ([place]) Planning on January. Will need Lovenox bridge

## 2018-11-07 ENCOUNTER — Telehealth: Payer: Self-pay

## 2018-11-07 DIAGNOSIS — I5042 Chronic combined systolic (congestive) and diastolic (congestive) heart failure: Secondary | ICD-10-CM

## 2018-11-07 DIAGNOSIS — I4811 Longstanding persistent atrial fibrillation: Secondary | ICD-10-CM

## 2018-11-07 DIAGNOSIS — Z954 Presence of other heart-valve replacement: Secondary | ICD-10-CM

## 2018-11-07 DIAGNOSIS — Z9889 Other specified postprocedural states: Secondary | ICD-10-CM

## 2018-11-07 DIAGNOSIS — I052 Rheumatic mitral stenosis with insufficiency: Secondary | ICD-10-CM

## 2018-11-07 DIAGNOSIS — I071 Rheumatic tricuspid insufficiency: Secondary | ICD-10-CM

## 2018-11-07 NOTE — Telephone Encounter (Signed)
Does he have follow-up? Echo first, then visit please. MCr

## 2018-11-07 NOTE — Telephone Encounter (Deleted)
-----   Message from [name], MD sent at 10/20/2018  6:37 PM EST -----   ----- Message ----- From: Rexene Alberts, MD Sent: 10/20/2018   3:06 PM EST To: Jolaine Artist, MD, [name], MD, #  Please see the attached note regarding our mutual patient from their visit in our office today.  CHO

## 2018-11-10 ENCOUNTER — Telehealth: Payer: Self-pay | Admitting: Cardiovascular Disease

## 2018-11-10 NOTE — Telephone Encounter (Signed)
Called patient to schedule his echo and LVM.

## 2018-11-10 NOTE — Telephone Encounter (Signed)
Called the patient to schedule his echo and left a VM to call back to schedule.

## 2018-11-24 ENCOUNTER — Telehealth: Payer: Self-pay | Admitting: Physician Assistant

## 2018-11-24 MED ORDER — SPIRONOLACTONE 25 MG PO TABS: 25.0000 mg | ORAL_TABLET | Freq: Every day | ORAL | 6 refills | Status: DC

## 2018-11-24 NOTE — Telephone Encounter (Signed)
New Message    *STAT* If patient is at the pharmacy, call can be transferred to refill team.   1. Which medications need to be refilled? (please list name of each medication and dose if known) spironolactone (ALDACTONE) 25 MG tablet  2. Which pharmacy/location (including street and city if local pharmacy) is medication to be sent to? [facility] [place]  3. Do they need a 30 day or 90 day supply? 30 day  Previous Cardiologist prescribed medication

## 2018-11-24 NOTE — Telephone Encounter (Signed)
Rx request sent to pharmacy.  

## 2018-12-01 ENCOUNTER — Ambulatory Visit (HOSPITAL_COMMUNITY)
Admission: RE | Admit: 2018-12-01 | Discharge: 2018-12-01 | Disposition: A | Discharge status: Home / Self Care | Payer: BLUE CROSS/BLUE SHIELD | Source: Ambulatory Visit | Attending: Cardiovascular Disease | Admitting: Cardiovascular Disease

## 2018-12-01 ENCOUNTER — Ambulatory Visit (INDEPENDENT_AMBULATORY_CARE_PROVIDER_SITE_OTHER): Payer: BLUE CROSS/BLUE SHIELD | Admitting: Pharmacist

## 2018-12-01 DIAGNOSIS — I4811 Longstanding persistent atrial fibrillation: Secondary | ICD-10-CM | POA: Insufficient documentation

## 2018-12-01 DIAGNOSIS — I5042 Chronic combined systolic (congestive) and diastolic (congestive) heart failure: Secondary | ICD-10-CM

## 2018-12-01 DIAGNOSIS — I4891 Unspecified atrial fibrillation: Secondary | ICD-10-CM | POA: Diagnosis not present

## 2018-12-01 DIAGNOSIS — Z9889 Other specified postprocedural states: Secondary | ICD-10-CM | POA: Insufficient documentation

## 2018-12-01 DIAGNOSIS — Z5181 Encounter for therapeutic drug level monitoring: Secondary | ICD-10-CM

## 2018-12-01 DIAGNOSIS — Z954 Presence of other heart-valve replacement: Secondary | ICD-10-CM | POA: Insufficient documentation

## 2018-12-01 LAB — POCT INR: INR: 2.2 (ref 2.0–3.0)

## 2018-12-01 MED ORDER — PERFLUTREN LIPID MICROSPHERE
1.0000 mL | INTRAVENOUS | Status: AC | PRN
  Administered 2018-12-01 (×2): 1 mL via INTRAVENOUS
  Administered 2018-12-01: 2 mL via INTRAVENOUS
  Filled 2018-12-01: qty 10

## 2018-12-01 NOTE — Progress Notes (Signed)
*  PRELIMINARY RESULTS* Echocardiogram 2D Echocardiogram has been performed with Definity.  Samuel Germany 12/01/2018, 3:25 PM

## 2018-12-01 NOTE — Patient Instructions (Signed)
Description   Take 1.5 tablet today then continue coumadin 4mg  daily Recheck in 2 weeks Pending 3 dental extractions.  Not scheduled yet. ([place]) Planning on January. Will need Lovenox bridge

## 2018-12-03 ENCOUNTER — Ambulatory Visit (HOSPITAL_COMMUNITY)
Admission: RE | Admit: 2018-12-03 | Discharge: 2018-12-03 | Disposition: A | Discharge status: Home / Self Care | Payer: BLUE CROSS/BLUE SHIELD | Source: Ambulatory Visit | Attending: Internal Medicine | Admitting: Internal Medicine

## 2018-12-03 ENCOUNTER — Encounter (HOSPITAL_COMMUNITY): Payer: Self-pay | Admitting: Internal Medicine

## 2018-12-03 VITALS — BP 152/78 | HR 52 | Wt 298.6 lb

## 2018-12-03 DIAGNOSIS — Z79899 Other long term (current) drug therapy: Secondary | ICD-10-CM | POA: Diagnosis not present

## 2018-12-03 DIAGNOSIS — I252 Old myocardial infarction: Secondary | ICD-10-CM | POA: Diagnosis not present

## 2018-12-03 DIAGNOSIS — I052 Rheumatic mitral stenosis with insufficiency: Secondary | ICD-10-CM | POA: Diagnosis not present

## 2018-12-03 DIAGNOSIS — Z8249 Family history of ischemic heart disease and other diseases of the circulatory system: Secondary | ICD-10-CM | POA: Insufficient documentation

## 2018-12-03 DIAGNOSIS — Z952 Presence of prosthetic heart valve: Secondary | ICD-10-CM | POA: Diagnosis not present

## 2018-12-03 DIAGNOSIS — Z954 Presence of other heart-valve replacement: Secondary | ICD-10-CM | POA: Diagnosis not present

## 2018-12-03 DIAGNOSIS — I110 Hypertensive heart disease with heart failure: Secondary | ICD-10-CM | POA: Diagnosis not present

## 2018-12-03 DIAGNOSIS — I050 Rheumatic mitral stenosis: Secondary | ICD-10-CM | POA: Insufficient documentation

## 2018-12-03 DIAGNOSIS — Z6841 Body Mass Index (BMI) 40.0 and over, adult: Secondary | ICD-10-CM | POA: Diagnosis not present

## 2018-12-03 DIAGNOSIS — J449 Chronic obstructive pulmonary disease, unspecified: Secondary | ICD-10-CM | POA: Diagnosis not present

## 2018-12-03 DIAGNOSIS — I2510 Atherosclerotic heart disease of native coronary artery without angina pectoris: Secondary | ICD-10-CM | POA: Diagnosis not present

## 2018-12-03 DIAGNOSIS — I482 Chronic atrial fibrillation, unspecified: Secondary | ICD-10-CM | POA: Insufficient documentation

## 2018-12-03 DIAGNOSIS — Z955 Presence of coronary angioplasty implant and graft: Secondary | ICD-10-CM | POA: Insufficient documentation

## 2018-12-03 DIAGNOSIS — I5042 Chronic combined systolic (congestive) and diastolic (congestive) heart failure: Secondary | ICD-10-CM | POA: Diagnosis not present

## 2018-12-03 DIAGNOSIS — I2721 Secondary pulmonary arterial hypertension: Secondary | ICD-10-CM

## 2018-12-03 DIAGNOSIS — E785 Hyperlipidemia, unspecified: Secondary | ICD-10-CM | POA: Insufficient documentation

## 2018-12-03 DIAGNOSIS — Z9889 Other specified postprocedural states: Secondary | ICD-10-CM | POA: Diagnosis not present

## 2018-12-03 DIAGNOSIS — J939 Pneumothorax, unspecified: Secondary | ICD-10-CM | POA: Diagnosis not present

## 2018-12-03 DIAGNOSIS — G4730 Sleep apnea, unspecified: Secondary | ICD-10-CM | POA: Insufficient documentation

## 2018-12-03 DIAGNOSIS — Z87891 Personal history of nicotine dependence: Secondary | ICD-10-CM | POA: Diagnosis not present

## 2018-12-03 DIAGNOSIS — Z7982 Long term (current) use of aspirin: Secondary | ICD-10-CM | POA: Diagnosis not present

## 2018-12-03 DIAGNOSIS — Z7901 Long term (current) use of anticoagulants: Secondary | ICD-10-CM | POA: Diagnosis not present

## 2018-12-03 DIAGNOSIS — I4819 Other persistent atrial fibrillation: Secondary | ICD-10-CM | POA: Diagnosis not present

## 2018-12-03 DIAGNOSIS — I272 Pulmonary hypertension, unspecified: Secondary | ICD-10-CM | POA: Diagnosis not present

## 2018-12-03 LAB — BASIC METABOLIC PANEL
Anion gap: 6 (ref 5–15)
BUN: 26 mg/dL — ABNORMAL HIGH (ref 8–23)
CHLORIDE: 108 mmol/L (ref 98–111)
CO2: 25 mmol/L (ref 22–32)
CREATININE: 1.07 mg/dL (ref 0.61–1.24)
Calcium: 9.3 mg/dL (ref 8.9–10.3)
GFR calc non Af Amer: >60 mL/min (ref >60)
Glucose, Bld: 115 mg/dL — ABNORMAL HIGH (ref 70–99)
POTASSIUM: 4.6 mmol/L (ref 3.5–5.1)
Sodium: 139 mmol/L (ref 135–145)

## 2018-12-03 MED ORDER — NITROGLYCERIN 0.4 MG SL SUBL: 0.4000 mg | SUBLINGUAL_TABLET | SUBLINGUAL | 3 refills | Status: DC | PRN

## 2018-12-03 MED ORDER — SILDENAFIL CITRATE 20 MG PO TABS: 20.0000 mg | ORAL_TABLET | Freq: Three times a day (TID) | ORAL | 0 refills | Status: DC

## 2018-12-03 NOTE — Progress Notes (Signed)
PCP: Dr Sheppard Coil Primary HF Cardiologist: Dr Haroldine Laws  CT Surgeon: Dr Roxy Manns  HPI: Mr Jeffrey Larson is a 63 year old with history of obesity, COPD, chronic a fib, CAD, rheumatic mitral stenosis, TR, and chronic combined systolic/diastoic heart failure.In 2013 he suffered an acute myocardial infarction and was treated with multivessel PCI and stenting.   In June of this year he had sleep study with mild sleep apnea (AHI 11.5) and moderate desaturation with oxygen saturations down to 78%. CPAP was not recommended unless he was symptomatic.   Admitted in July with increased dyspnea. He underwent RHC/LHC. Dr Roxy Manns was consulted for severe mitral stenosis. Diuresed with IV lasix and later transitioned to lasix 40 mg three times a day. He was discharged to home with follow up at CT surgery.   On 07/18/18 he underwent scheduled MVR with mechanical MV, TVR using ring, and Maze. Post operatively he was placed on vanc + maxepime for possible HCAP. All drips gradually weaned off. Had CT placed on 8/28 for left pneumothorax.  CT removed on 8/31. Diuresed with lasix drip and later transitioned to torsemide 60 mg daily.  Discharge weight 286 pounds. Discharged 07/31/2018.  Today he returns for follow up. Overall feeling fine. Denies SOB/PND/Orthopnea. Appetite ok. No fever or chills. He has not been weighing consistently. No BRBPR.  Taking all medications but he did not take eny medications this morning.     L[facility] 06/10/2018 PA  72/42(53) PWP 31   PVR 5.2 CO/CI 4.12/27/65   Prox RCA lesion is 10% stenosed.  Mid RCA lesion is 50% stenosed.  Post Atrio lesion is 30% stenosed.  Ost LAD to Prox LAD lesion is 20% stenosed.  Prox Cx lesion is 20% stenosed.  Hemodynamic findings consistent with severe pulmonary hypertension.  LV end diastolic pressure is normal. Severe MV calcification. MV area 0.84 cm2.  ECHO 11/2018 EF 55-60 %. Normal prosthetic valve. RV moderately reduced.   TEE 05/2018  LVEF  35-40%  Moderate TR, Severe mitral stenosis  ECHO 2018  EF 50-55%  MV - severely calcified.     ROS: All systems negative except as listed in HPI, PMH and Problem List.  SH:  Social History   Socioeconomic History  . Marital status: Married    Spouse name: Not on file  . Number of children: Not on file  . Years of education: Not on file  . Highest education level: Not on file  Occupational History  . Not on file  Social Needs  . Financial resource strain: Not on file  . Food insecurity:    Worry: Not on file    Inability: Not on file  . Transportation needs:    Medical: Not on file    Non-medical: Not on file  Tobacco Use  . Smoking status: Former Smoker    Packs/day: 2.00    Years: 15.00    Pack years: 30.00    Types: Cigarettes    Last attempt to quit: 11/26/1996    Years since quitting: 22.0  . Smokeless tobacco: Never Used  Substance and Sexual Activity  . Alcohol use: No  . Drug use: No  . Sexual activity: Yes  Lifestyle  . Physical activity:    Days per week: Not on file    Minutes per session: Not on file  . Stress: Not on file  Relationships  . Social connections:    Talks on phone: Not on file    Gets together: Not on file  Attends religious service: Not on file    Active member of club or organization: Not on file    Attends meetings of clubs or organizations: Not on file    Relationship status: Not on file  . Intimate partner violence:    Fear of current or ex partner: Not on file    Emotionally abused: Not on file    Physically abused: Not on file    Forced sexual activity: Not on file  Other Topics Concern  . Not on file  Social History Narrative  . Not on file    FH:  Family History  Problem Relation Age of Onset  . Heart disease Mother   . Heart disease Father     Past Medical History:  Diagnosis Date  . Chronic combined systolic and diastolic congestive heart failure ([place])   . COPD with chronic bronchitis ([place])   .  Coronary artery disease   . Essential hypertension, benign    Ramipril to losartan Sept 2015   . History of pneumonia    RML on CXR 07/20/14  . Hyperlipidemia   . Longstanding persistent atrial fibrillation   . Mitral stenosis with regurgitation   . Myocardial infarction ([place])   . Pulmonary hypertension ([place])   . Renal disorder    history kidney stone  . S/P Maze operation for atrial fibrillation 07/18/2018   Complete bilateral atrial lesion set using bipolar radiofrequency and cryothermy ablation with clipping of LA appendage  . S/P mitral valve replacement with metallic valve 7/62/8315   Sorin Carbomedics Optiform bileaflet mechanical valve, size 33 mm  . S/P TVR (tricuspid valve repair) 07/18/2018   Edwards mc3 ring annuloplasty, size 30 mm    Current Outpatient Medications  Medication Sig Dispense Refill  . acetaminophen (TYLENOL) 500 MG tablet Take 1,000 mg by mouth 2 (two) times daily.    . Albuterol Sulfate (PROAIR RESPICLICK) 176 (90 Base) MCG/ACT AEPB Inhale 1 puff into the lungs every 6 (six) hours as needed (sob). 1 each 1  . AMBULATORY NON FORMULARY MEDICATION Medication Name: Glucometer, lancets and strip to test every other day. Dx: Diabetes type 2, E11.9. 100 strip and 100 lancets 1 Units PRN  . aspirin 81 MG tablet Take 81 mg by mouth daily.    Marland Kitchen atorvastatin (LIPITOR) 40 MG tablet Take 40 mg by mouth every evening.   2  . Fluticasone-Umeclidin-Vilant (TRELEGY ELLIPTA) 100-62.5-25 MCG/INH AEPB Inhale 1 puff into the lungs daily. 1 each 5  . metoprolol succinate (TOPROL-XL) 50 MG 24 hr tablet Take 1 tablet (50 mg total) by mouth daily. Take with or immediately following a meal. 30 tablet 3  . Omega-3 Fatty Acids (FISH OIL) 1200 MG CAPS Take 1,200 mg by mouth daily.    Marland Kitchen oxymetazoline (AFRIN) 0.05 % nasal spray Place 1 spray into both nostrils daily.    . sacubitril-valsartan (ENTRESTO) 24-26 MG Take 1 tablet by mouth 2 (two) times daily. 180 tablet 1  . spironolactone  (ALDACTONE) 25 MG tablet Take 1 tablet (25 mg total) by mouth daily. 30 tablet 6  . torsemide (DEMADEX) 20 MG tablet Take 2 tablets (40 mg total) by mouth daily. 60 tablet 11  . traMADol (ULTRAM) 50 MG tablet Take 1 tablet (50 mg total) by mouth every 6 (six) hours as needed for moderate pain. 20 tablet 0  . warfarin (COUMADIN) 4 MG tablet Take 4 mg by mouth as directed.    Marland Kitchen amoxicillin (AMOXIL) 500 MG capsule Take 4 capsules by  mouth 1 hour prior to dental work. (Patient not taking: Reported on 12/03/2018) 4 capsule 3  . nitroGLYCERIN (NITROSTAT) 0.4 MG SL tablet Place 1 tablet (0.4 mg total) under the tongue every 5 (five) minutes x 3 doses as needed for chest pain. 25 tablet 3   No current facility-administered medications for this encounter.     Vitals:   12/03/18 0943  BP: (!) 152/78  Pulse: (!) 52  SpO2: 98%  Weight: 135.4 kg (298 lb 9.6 oz)   Wt Readings from Last 3 Encounters:  12/03/18 135.4 kg (298 lb 9.6 oz)  10/20/18 123.4 kg (272 lb)  09/08/18 125.2 kg (276 lb)   Lab Results  Component Value Date   CREATININE 0.90 09/08/2018   CREATININE 1.49 (H) 08/08/2018   CREATININE 1.21 07/31/2018    PHYSICAL EXAM: General:  Well appearing. No resp difficulty HEENT: normal Neck: supple. no JVD. Carotids 2+ bilat; no bruits. No lymphadenopathy or thryomegaly appreciated. Cor: PMI nondisplaced. Irregular rate & rhythm. Mechanical s1 No rubs, gallops or murmurs. Lungs: clear Abdomen: obese soft, nontender, nondistended. No hepatosplenomegaly. No bruits or masses. Good bowel sounds. Extremities: no cyanosis, clubbing, rash, trace edema Neuro: alert & orientedx3, cranial nerves grossly intact. moves all 4 extremities w/o difficulty. Affect pleasant    ASSESSMENT & PLAN: 1. Severe Mitral Stenosis --> S/P Mitral Valve Replacement on 07/18/18 He has seen Dr Roxy Manns in follow up.   2. Moderate TR --->S/P TVR with ring on 07/18/18  3. Pneumothorax S/P L chest placed 8/28. CT  removed 8/31  4. Chronic combined Systolic/Diastolic HF---> R >L ECHO 07/2018  LVEF 50-55% RV marked dilitation.  ECHO 12/01/2018 LVEF 55-60%.  NYHA I. Continue torsemide to 40 mg daily. Instructed to take an extra 20 mg torsemide for 3 pound weight gain.  - Continue Toprol xl 50 mg daily. Continue entresto 24-26 mg twice daily.  Continue spironolactone 25 mg daily   5. Chronic A fib Rate control. Continue current dose bb On coumadin.[facility]  6. CAD  NO S/S ischemia    On statin/asa.  LHC 06/10/2018  Prox RCA lesion is 10% stenosed.  Mid RCA lesion is 50% stenosed.  Ost LAD to Prox LAD lesion is 20% stenosed.  Prox Cx lesion is 20% stenosed.  7. Morbid Obesity Body mass index is 41.65 kg/m. Discussed portion control.   8. Pulmonary HTN Elevated PVR on RHC7/2019 Had sleep study 04/2018. Mild sleep apnea with desaturations down to 78%. He does not use oxygen at night and doesn't want to pursue.   Refer to Dr Elvia Collum   Darrick Grinder NP-C    Patient seen and examined with the above-signed Advanced Practice Provider and/or Housestaff. I personally reviewed laboratory data, imaging studies and relevant notes. I independently examined the patient and formulated the important aspects of the plan. I have edited the note to reflect any of my changes or salient points. I have personally discussed the plan with the patient and/or family.  Doing well s/p MVR and TV ring. Echo reviewed personally LVEF is normal . Valves look good however remain dilated and at least moderately HK. Stressed need to participate in CR and lose weight. Will start sildenafil 20 tid. Volume status minimally up. Stressed need for SBE prophylaxis with any invasive procedures.   Glori Bickers, MD  10:44 AM

## 2018-12-03 NOTE — Patient Instructions (Signed)
Labs done today  START Sildenafil 20mg  (1 tab) three times daily  You have been referred to Dr. Domenic Polite at [facility]. They will call you in order to set up an appointment.  You have been referred to Cardiac Rehab at [facility]. They will contact you in order to set your appointment.  REFILLED Nitro  Follow up with the [facility] as needed.

## 2018-12-03 NOTE — Addendum Note (Signed)
Encounter addended by: Marlise Eves, RN on: 12/03/2018 10:59 AM  Actions taken: Visit diagnoses modified, Order list changed, Diagnosis association updated, Charge Capture section accepted, Clinical Note Signed

## 2018-12-08 ENCOUNTER — Ambulatory Visit (INDEPENDENT_AMBULATORY_CARE_PROVIDER_SITE_OTHER): Payer: BLUE CROSS/BLUE SHIELD | Admitting: Osteopathic Medicine

## 2018-12-08 ENCOUNTER — Other Ambulatory Visit (HOSPITAL_COMMUNITY): Payer: Self-pay

## 2018-12-08 ENCOUNTER — Encounter: Payer: Self-pay | Admitting: Osteopathic Medicine

## 2018-12-08 VITALS — BP 111/68 | HR 71 | Temp 98.4°F | Wt 297.1 lb

## 2018-12-08 DIAGNOSIS — I272 Pulmonary hypertension, unspecified: Secondary | ICD-10-CM

## 2018-12-08 DIAGNOSIS — I10 Essential (primary) hypertension: Secondary | ICD-10-CM

## 2018-12-08 DIAGNOSIS — I4811 Longstanding persistent atrial fibrillation: Secondary | ICD-10-CM

## 2018-12-08 DIAGNOSIS — I2510 Atherosclerotic heart disease of native coronary artery without angina pectoris: Secondary | ICD-10-CM

## 2018-12-08 DIAGNOSIS — Z0000 Encounter for general adult medical examination without abnormal findings: Secondary | ICD-10-CM

## 2018-12-08 DIAGNOSIS — Z954 Presence of other heart-valve replacement: Secondary | ICD-10-CM

## 2018-12-08 DIAGNOSIS — E782 Mixed hyperlipidemia: Secondary | ICD-10-CM | POA: Diagnosis not present

## 2018-12-08 DIAGNOSIS — E119 Type 2 diabetes mellitus without complications: Secondary | ICD-10-CM | POA: Diagnosis not present

## 2018-12-08 DIAGNOSIS — Z1159 Encounter for screening for other viral diseases: Secondary | ICD-10-CM

## 2018-12-08 LAB — POCT GLYCOSYLATED HEMOGLOBIN (HGB A1C)

## 2018-12-08 MED ORDER — PREDNISONE 20 MG PO TABS: 20.0000 mg | ORAL_TABLET | Freq: Two times a day (BID) | ORAL | 0 refills | Status: DC

## 2018-12-08 MED ORDER — BENZONATATE 200 MG PO CAPS: 200.0000 mg | ORAL_CAPSULE | Freq: Three times a day (TID) | ORAL | 0 refills | Status: DC | PRN

## 2018-12-08 MED ORDER — IPRATROPIUM BROMIDE 0.06 % NA SOLN: 2.0000 | Freq: Four times a day (QID) | NASAL | 1 refills | Status: DC

## 2018-12-08 MED ORDER — SILDENAFIL CITRATE 20 MG PO TABS: 20.0000 mg | ORAL_TABLET | Freq: Three times a day (TID) | ORAL | 3 refills | Status: DC

## 2018-12-08 MED ORDER — ZOSTER VAC RECOMB ADJUVANTED 50 MCG/0.5ML IM SUSR: 0.5000 mL | Freq: Once | INTRAMUSCULAR | 1 refills | Status: AC

## 2018-12-08 NOTE — Patient Instructions (Addendum)
General Preventive Care  Most recent routine screening lipids/other labs: ordered today. Cholesterol screening recommended annually.  Tobacco: don't!   Alcohol: responsible moderation is ok for most adults - if you have concerns about your alcohol intake, please talk to me!   Exercise: as tolerated to reduce risk of cardiovascular disease and diabetes. Strength training will also prevent osteoporosis.   Mental health: if need for mental health care (medicines, counseling, other), or concerns about moods, please let me know!   Sexual health: if need for STD testing, or if concerns with libido/pain problems, please let me know! If you need to discuss your birth control options, please let me know!   Advanced Directive: Living Will and/or Healthcare Power of Attorney recommended for all adults, regardless of age or health. See printed information.  Vaccines  Flu vaccine: recommended for almost everyone, every fall. Will confirm w/ [facility].  Shingles vaccine: Shingrix recommended after age 45. Rx printed for pharmacy.   Pneumonia vaccines: Pneumovax recommended prior to age 81 given your cardiac history. Will confirm w/ [facility].   Tetanus booster: Tdap recommended every 10 years. Done 08/2015 Cancer screenings   Colon cancer screening: next Cologuard due 02/2020  Prostate cancer screening: PSA blood test around age 64  Lung cancer screening: not needed since you quit smoking more than 15 years ago  Infection screenings . HIV: recommended screening at least once age 72-65, more often as needed. . Gonorrhea/Chlamydia: screening as needed . Hepatitis C: recommended for anyone born 76-1965, will order this  . TB: certain at-risk populations, or depending on work requirements and/or travel history Other . Bone Density Test: recommended for men at age 25, sooner depending on risk factors       Medications & Home Remedies for Upper Respiratory Illness   Aches/Pains, Fever,  Headache OTC Acetaminophen (Tylenol) 500 mg tablets - take max 2 tablets (1000 mg) every 6 hours (4 times per day)    Sinus Congestion Prescription Atrovent as directed OTC Nasal Saline if desired to rinse OTC Oxymetolazone (Afrin, others) sparing use due to rebound congestion, NEVER use in kids. Use maximum of 5 days in a row.  OTC Phenylephrine (Sudafed) 10 mg tablets every 4 hours (or the 12-hour formulation) OTC Diphenhydramine (Benadryl) 25 mg tablets - take max 2 tablets every 4 hours   Cough & Sore Throat Prescription cough pills or syrups as directed OTC Dextromethorphan (Robitussin, others) - cough suppressant OTC Guaifenesin (Robitussin, Mucinex, others) - expectorant (helps cough up mucus) (Dextromethorphan and Guaifenesin also come in a combination tablet/syrup) OTC Lozenges w/ Benzocaine + Menthol (Cepacol) Honey - as much as you want! Teas which "coat the throat" - look for ingredients Elm Bark, Licorice Root, Marshmallow Root   Other Prescription Oral Steroids to decrease inflammation and improve energy OTC Zinc Lozenges within 24 hours of symptoms onset - mixed evidence this shortens the duration of the common cold Don't waste your money on Vitamin C or Echinacea in acute illness - it's already too late!

## 2018-12-08 NOTE — Progress Notes (Signed)
HPI: Jeffrey Larson is a 63 y.o. male who  has a past medical history of Chronic combined systolic and diastolic congestive heart failure ([place]), COPD with chronic bronchitis ([place]), Coronary artery disease, Essential hypertension, benign, History of pneumonia, Hyperlipidemia, Longstanding persistent atrial fibrillation, Mitral stenosis with regurgitation, Myocardial infarction [facility]), Pulmonary hypertension ([place]), Renal disorder, S/P Maze operation for atrial fibrillation (07/18/2018), S/P mitral valve replacement with metallic valve (05/22/349), and S/P TVR (tricuspid valve repair) (07/18/2018).  he presents to [facility] today, 12/08/18,  for chief complaint of: Annual Physical     Patient here for annual physical / wellness exam.  See preventive care reviewed as below.  Recent labs reviewed.   Additional concerns today include:  Getting over a cold, sinus congestion, cough, has been taking Afrin (which he has been taking long term daily...), robitussin, mucinex. No fever or SOB.      Past medical, surgical, social and family history reviewed:  Patient Active Problem List   Diagnosis Date Noted  . Encounter for therapeutic drug monitoring 08/04/2018  . Pressure injury of skin 07/30/2018  . S/P mitral valve replacement with metallic valve + tricuspid valve repair + maze procedure 07/18/2018  . S/P TVR (tricuspid valve repair) 07/18/2018  . S/P Maze operation for atrial fibrillation 07/18/2018  . Tricuspid valve insufficiency 07/15/2018  . Persistent atrial fibrillation 07/15/2018  . Morbid obesity ([place])   . Chronic combined systolic and diastolic congestive heart failure ([place])   . Pulmonary hypertension ([place])   . Cold sore 10/28/2017  . Neck pain 10/01/2017  . Coronary artery disease   . Controlled type 2 diabetes mellitus without complication, without long-term current use of insulin ([place]) 08/02/2017  . Cardiac disease 04/18/2017  .  Longstanding persistent atrial fibrillation   . Chronic combined systolic and diastolic heart failure ([place]) 11/12/2016  . Right shoulder pain 07/09/2016  . Bilateral low back pain without sciatica 04/30/2016  . Hematuria, microscopic 04/30/2016  . COPD with chronic bronchitis ([place]) 09/08/2014  . Mitral stenosis with regurgitation   . History of MI (myocardial infarction) 08/04/2014  . Hyperlipidemia 08/04/2014  . Essential hypertension, benign 08/04/2014    Past Surgical History:  Procedure Laterality Date  . APPENDECTOMY    . CARDIOVASCULAR STRESS TEST  03/2014   Borderline reversible ischemic changes at the apex.  Normal LV contractility/EF 52%.  . CORONARY STENT PLACEMENT  7/12013  . LEFT HEART CATHETERIZATION WITH CORONARY ANGIOGRAM N/A 06/12/2012   Procedure: LEFT HEART CATHETERIZATION WITH CORONARY ANGIOGRAM;  Surgeon: Clent Demark, MD;  Location: [facility] CATH LAB;  Service: Cardiovascular;  Laterality: N/A;  . MAZE N/A 07/18/2018   Procedure: MAZE;  Surgeon: Rexene Alberts, MD;  Location: [place];  Service: Open Heart Surgery;  Laterality: N/A;  . MITRAL VALVE REPLACEMENT N/A 07/18/2018   Procedure: MITRAL VALVE (MV) REPLACEMENT WITH CARBOMEDICS OPTIFORM MITRAL VALVE SIZE 33.;  Surgeon: Rexene Alberts, MD;  Location: [place];  Service: Open Heart Surgery;  Laterality: N/A;  . PERCUTANEOUS CORONARY STENT INTERVENTION (PCI-S) N/A 06/17/2012   Procedure: PERCUTANEOUS CORONARY STENT INTERVENTION (PCI-S);  Surgeon: Clent Demark, MD;  Location: [facility] CATH LAB;  Service: Cardiovascular;  Laterality: N/A;  . RIGHT/LEFT HEART CATH AND CORONARY ANGIOGRAPHY N/A 06/10/2018   Procedure: RIGHT/LEFT HEART CATH AND CORONARY ANGIOGRAPHY;  Surgeon: Dixie Dials, MD;  Location: [place] CV LAB;  Service: Cardiovascular;  Laterality: N/A;  . TEE WITHOUT CARDIOVERSION N/A 06/10/2018   Procedure: TRANSESOPHAGEAL ECHOCARDIOGRAM (TEE) with  bubble study;  Surgeon: Dixie Dials, MD;  Location: [facility] ENDOSCOPY;   Service: Cardiovascular;  Laterality: N/A;  . TEE WITHOUT CARDIOVERSION N/A 07/18/2018   Procedure: TRANSESOPHAGEAL ECHOCARDIOGRAM (TEE);  Surgeon: Rexene Alberts, MD;  Location: [place];  Service: Open Heart Surgery;  Laterality: N/A;  . TRICUSPID VALVE REPLACEMENT N/A 07/18/2018   Procedure: TRICUSPID VALVE REPAIR WITH EDWARDS MC3 TRICUSPID ANNULOPLASTY RING SIZE 30.;  Surgeon: Rexene Alberts, MD;  Location: [place];  Service: Open Heart Surgery;  Laterality: N/A;    Social History   Tobacco Use  . Smoking status: Former Smoker    Packs/day: 2.00    Years: 15.00    Pack years: 30.00    Types: Cigarettes    Last attempt to quit: 11/26/1996    Years since quitting: 22.0  . Smokeless tobacco: Never Used  Substance Use Topics  . Alcohol use: No    Family History  Problem Relation Age of Onset  . Heart disease Mother   . Heart disease Father      Current medication list and allergy/intolerance information reviewed:    Current Outpatient Medications  Medication Sig Dispense Refill  . acetaminophen (TYLENOL) 500 MG tablet Take 1,000 mg by mouth 2 (two) times daily.    . Albuterol Sulfate (PROAIR RESPICLICK) 540 (90 Base) MCG/ACT AEPB Inhale 1 puff into the lungs every 6 (six) hours as needed (sob). 1 each 1  . AMBULATORY NON FORMULARY MEDICATION Medication Name: Glucometer, lancets and strip to test every other day. Dx: Diabetes type 2, E11.9. 100 strip and 100 lancets 1 Units PRN  . amoxicillin (AMOXIL) 500 MG capsule Take 4 capsules by mouth 1 hour prior to dental work. 4 capsule 3  . aspirin 81 MG tablet Take 81 mg by mouth daily.    Marland Kitchen atorvastatin (LIPITOR) 40 MG tablet Take 40 mg by mouth every evening.   2  . Fluticasone-Umeclidin-Vilant (TRELEGY ELLIPTA) 100-62.5-25 MCG/INH AEPB Inhale 1 puff into the lungs daily. 1 each 5  . metoprolol succinate (TOPROL-XL) 50 MG 24 hr tablet Take 1 tablet (50 mg total) by mouth daily. Take with or immediately following a meal. 30 tablet 3  .  nitroGLYCERIN (NITROSTAT) 0.4 MG SL tablet Place 1 tablet (0.4 mg total) under the tongue every 5 (five) minutes x 3 doses as needed for chest pain. 25 tablet 3  . Omega-3 Fatty Acids (FISH OIL) 1200 MG CAPS Take 1,200 mg by mouth daily.    Marland Kitchen oxymetazoline (AFRIN) 0.05 % nasal spray Place 1 spray into both nostrils daily.    . sacubitril-valsartan (ENTRESTO) 24-26 MG Take 1 tablet by mouth 2 (two) times daily. 180 tablet 1  . sildenafil (REVATIO) 20 MG tablet Take 1 tablet (20 mg total) by mouth 3 (three) times daily. 90 tablet 3  . spironolactone (ALDACTONE) 25 MG tablet Take 1 tablet (25 mg total) by mouth daily. 30 tablet 6  . torsemide (DEMADEX) 20 MG tablet Take 2 tablets (40 mg total) by mouth daily. 60 tablet 11  . traMADol (ULTRAM) 50 MG tablet Take 1 tablet (50 mg total) by mouth every 6 (six) hours as needed for moderate pain. 20 tablet 0  . warfarin (COUMADIN) 4 MG tablet Take 4 mg by mouth as directed.    . benzonatate (TESSALON) 200 MG capsule Take 1 capsule (200 mg total) by mouth 3 (three) times daily as needed for cough. 30 capsule 0  . ipratropium (ATROVENT) 0.06 % nasal spray Place 2 sprays into  both nostrils 4 (four) times daily. 15 mL 1  . predniSONE (DELTASONE) 20 MG tablet Take 1 tablet (20 mg total) by mouth 2 (two) times daily with a meal. 10 tablet 0  . Zoster Vaccine Adjuvanted [facility]) injection Inject 0.5 mLs into the muscle once for 1 dose. Repeat in 2 to 6 months. [facility] records to [phone] Dr Sheppard Coil 0.5 mL 1   No current facility-administered medications for this visit.     Allergies  Allergen Reactions  . Ramipril Cough    cough  . Percocet  [Oxycodone-Acetaminophen]       Review of Systems:  Constitutional:  No  fever, no chills, No recent illness, No unintentional weight changes. No significant fatigue.   HEENT: No  headache, no vision change, no hearing change, +sore throat, +sinus pressure  Cardiac: No  chest pain, No  pressure, No  palpitations, No  Orthopnea  Respiratory:  No  shortness of breath. +Cough  Gastrointestinal: No  abdominal pain, No  nausea, No  vomiting,  No  blood in stool, No  diarrhea, No  constipation   Musculoskeletal: No new myalgia/arthralgia  Skin: No  Rash  Genitourinary: No  incontinence, No  abnormal genital bleeding, No abnormal genital discharge  Hem/Onc: No  easy bruising/bleeding, No  abnormal lymph node  Endocrine: No cold intolerance,  No heat intolerance. No polyuria/polydipsia/polyphagia   Neurologic: No  weakness, No  dizziness  Psychiatric: No  concerns with depression, No  concerns with anxiety, No sleep problems, No mood problems  Exam:  BP 111/68   Pulse 71   Temp 98.4 F (36.9 C) (Oral)   Wt 297 lb 1.9 oz (134.8 kg)   BMI 41.44 kg/m   Constitutional: VS see above. General Appearance: alert, well-developed, well-nourished, NAD  Eyes: Normal lids and conjunctive, non-icteric sclera  Ears, Nose, Mouth, Throat: MMM, Normal external inspection ears/nares/mouth/lips/gums. TM normal bilaterally. Pharynx/tonsils +erythema, no exudate. Nasal mucosa normal.   Neck: No masses, trachea midline. No thyroid enlargement. No tenderness/mass appreciated. No lymphadenopathy  Respiratory: Normal respiratory effort. no wheeze, no rhonchi, no rales  Cardiovascular: S1/S2 normal, no murmur, no rub/gallop auscultated. Irreg/irreg, click of valve is audible. No lower extremity edema. .  Gastrointestinal: Nontender, no masses. No hepatomegaly, no splenomegaly. No hernia appreciated. Bowel sounds normal. Rectal exam deferred.   Musculoskeletal: Gait normal. No clubbing/cyanosis of digits.   Neurological: Normal balance/coordination. No tremor. No cranial nerve deficit on limited exam. Motor and sensation intact and symmetric. Cerebellar reflexes intact.   Skin: warm, dry, intact. No rash/ulcer. No concerning nevi or subq nodules on limited exam.    Psychiatric: Normal  judgment/insight. Normal mood and affect. Oriented x3.    Results for orders placed or performed in visit on 12/08/18 (from the past 72 hour(s))  POCT HgB A1C     Status: Abnormal   Collection Time: 12/08/18  9:31 AM  Result Value Ref Range   Hemoglobin A1C     HbA1c POC (<> result, manual entry)      Comment: high volume hgb to be repeated. Will obtain labs   HbA1c, POC (prediabetic range)     HbA1c, POC (controlled diabetic range)      No results found.       ASSESSMENT/PLAN: The primary encounter diagnosis was Annual physical exam. Diagnoses of Controlled type 2 diabetes mellitus without complication, without long-term current use of insulin ([place]), Essential hypertension, benign, Longstanding persistent atrial fibrillation, Coronary artery disease involving native coronary artery of native heart without  angina pectoris, Pulmonary hypertension (HCC), S/P mitral valve replacement with metallic valve + tricuspid valve repair + maze procedure, Mixed hyperlipidemia, and Need for hepatitis C screening test were also pertinent to this visit.   Orders Placed This Encounter  Procedures  . CBC  . COMPLETE METABOLIC PANEL WITH GFR  . Lipid panel  . TSH  . Hepatitis C antibody  . Hemoglobin A1c  . POCT HgB A1C    Meds ordered this encounter  Medications  . Zoster Vaccine Adjuvanted [facility]) injection    Sig: Inject 0.5 mLs into the muscle once for 1 dose. Repeat in 2 to 6 months. [facility] records to [phone] Dr Sheppard Coil    Dispense:  0.5 mL    Refill:  1  . ipratropium (ATROVENT) 0.06 % nasal spray    Sig: Place 2 sprays into both nostrils 4 (four) times daily.    Dispense:  15 mL    Refill:  1  . benzonatate (TESSALON) 200 MG capsule    Sig: Take 1 capsule (200 mg total) by mouth 3 (three) times daily as needed for cough.    Dispense:  30 capsule    Refill:  0  . predniSONE (DELTASONE) 20 MG tablet    Sig: Take 1 tablet (20 mg total) by mouth 2 (two) times daily with a  meal.    Dispense:  10 tablet    Refill:  0    Patient Instructions  General Preventive Care  Most recent routine screening lipids/other labs: ordered today. Cholesterol screening recommended annually.  Tobacco: don't!   Alcohol: responsible moderation is ok for most adults - if you have concerns about your alcohol intake, please talk to me!   Exercise: as tolerated to reduce risk of cardiovascular disease and diabetes. Strength training will also prevent osteoporosis.   Mental health: if need for mental health care (medicines, counseling, other), or concerns about moods, please let me know!   Sexual health: if need for STD testing, or if concerns with libido/pain problems, please let me know! If you need to discuss your birth control options, please let me know!   Advanced Directive: Living Will and/or Healthcare Power of Attorney recommended for all adults, regardless of age or health. See printed information.  Vaccines  Flu vaccine: recommended for almost everyone, every fall. Will confirm w/ [facility].  Shingles vaccine: Shingrix recommended after age 66. Rx printed for pharmacy.   Pneumonia vaccines: Pneumovax recommended prior to age 57 given your cardiac history. Will confirm w/ [facility].   Tetanus booster: Tdap recommended every 10 years. Done 08/2015 Cancer screenings   Colon cancer screening: next Cologuard due 02/2020  Prostate cancer screening: PSA blood test around age 54  Lung cancer screening: not needed since you quit smoking more than 15 years ago  Infection screenings . HIV: recommended screening at least once age 51-65, more often as needed. . Gonorrhea/Chlamydia: screening as needed . Hepatitis C: recommended for anyone born 52-1965, will order this  . TB: certain at-risk populations, or depending on work requirements and/or travel history Other . Bone Density Test: recommended for men at age 35, sooner depending on risk  factors       Medications & Home Remedies for Upper Respiratory Illness   Aches/Pains, Fever, Headache OTC Acetaminophen (Tylenol) 500 mg tablets - take max 2 tablets (1000 mg) every 6 hours (4 times per day)    Sinus Congestion Prescription Atrovent as directed OTC Nasal Saline if desired to rinse OTC Oxymetolazone (Afrin, others)  sparing use due to rebound congestion, NEVER use in kids. Use maximum of 5 days in a row.  OTC Phenylephrine (Sudafed) 10 mg tablets every 4 hours (or the 12-hour formulation) OTC Diphenhydramine (Benadryl) 25 mg tablets - take max 2 tablets every 4 hours   Cough & Sore Throat Prescription cough pills or syrups as directed OTC Dextromethorphan (Robitussin, others) - cough suppressant OTC Guaifenesin (Robitussin, Mucinex, others) - expectorant (helps cough up mucus) (Dextromethorphan and Guaifenesin also come in a combination tablet/syrup) OTC Lozenges w/ Benzocaine + Menthol (Cepacol) Honey - as much as you want! Teas which "coat the throat" - look for ingredients Elm Bark, Licorice Root, Marshmallow Root   Other Prescription Oral Steroids to decrease inflammation and improve energy OTC Zinc Lozenges within 24 hours of symptoms onset - mixed evidence this shortens the duration of the common cold Don't waste your money on Vitamin C or Echinacea in acute illness - it's already too late!                  Visit summary with medication list and pertinent instructions was printed for patient to review. All questions at time of visit were answered - patient instructed to contact office with any additional concerns or updates. ER/RTC precautions were reviewed with the patient.      Please note: voice recognition software was used to produce this document, and typos may escape review. Please contact Dr. Sheppard Coil for any needed clarifications.     Follow-up plan: Return for recheck sugars in 3-4 months depending on today's blood work results  .

## 2018-12-09 LAB — COMPLETE METABOLIC PANEL WITH GFR
AG Ratio: 1.6 (calc) (ref 1.0–2.5)
ALBUMIN MSPROF: 4.1 g/dL (ref 3.6–5.1)
ALKALINE PHOSPHATASE (APISO): 94 U/L (ref 40–115)
ALT: 18 U/L (ref 9–46)
AST: 16 U/L (ref 10–35)
BUN: 23 mg/dL (ref 7–25)
CALCIUM: 9.2 mg/dL (ref 8.6–10.3)
CHLORIDE: 102 mmol/L (ref 98–110)
CO2: 25 mmol/L (ref 20–32)
Creat: 1.02 mg/dL (ref 0.70–1.25)
GFR, EST AFRICAN AMERICAN: 91 mL/min/{1.73_m2} (ref >=60)
GFR, Est Non African American: 78 mL/min/{1.73_m2} (ref >=60)
GLUCOSE: 113 mg/dL — AB (ref 65–99)
Globulin: 2.5 g/dL (calc) (ref 1.9–3.7)
POTASSIUM: 4.5 mmol/L (ref 3.5–5.3)
Sodium: 139 mmol/L (ref 135–146)
Total Bilirubin: 0.5 mg/dL (ref 0.2–1.2)
Total Protein: 6.6 g/dL (ref 6.1–8.1)

## 2018-12-09 LAB — HEMOGLOBIN A1C
EAG (MMOL/L): 6.3 (calc)
HEMOGLOBIN A1C: 5.6 %{Hb} (ref <5.7)
MEAN PLASMA GLUCOSE: 114 (calc)

## 2018-12-09 LAB — CBC
HEMATOCRIT: 38.8 % (ref 38.5–50.0)
HEMOGLOBIN: 12.9 g/dL — AB (ref 13.2–17.1)
MCH: 28.9 pg (ref 27.0–33.0)
MCHC: 33.2 g/dL (ref 32.0–36.0)
MCV: 87 fL (ref 80.0–100.0)
MPV: 10.1 fL (ref 7.5–12.5)
PLATELETS: 315 10*3/uL (ref 140–400)
RBC: 4.46 10*6/uL (ref 4.20–5.80)
RDW: 13.8 % (ref 11.0–15.0)
WBC: 14.4 10*3/uL — ABNORMAL HIGH (ref 3.8–10.8)

## 2018-12-09 LAB — HEPATITIS C ANTIBODY
HEP C AB: NONREACTIVE
SIGNAL TO CUT-OFF: 0.03 (ref <1.00)

## 2018-12-09 LAB — LIPID PANEL
CHOLESTEROL: 139 mg/dL (ref <200)
HDL: 33 mg/dL — ABNORMAL LOW (ref >40)
LDL Cholesterol (Calc): 77 mg/dL (calc)
Non-HDL Cholesterol (Calc): 106 mg/dL (calc) (ref <130)
Total CHOL/HDL Ratio: 4.2 (calc) (ref <5.0)
Triglycerides: 194 mg/dL — ABNORMAL HIGH (ref <150)

## 2018-12-09 LAB — TSH: TSH: 1.3 m[IU]/L (ref 0.40–4.50)

## 2018-12-15 ENCOUNTER — Other Ambulatory Visit (HOSPITAL_COMMUNITY): Payer: Self-pay

## 2018-12-15 MED ORDER — SILDENAFIL CITRATE 20 MG PO TABS: 20.0000 mg | ORAL_TABLET | Freq: Three times a day (TID) | ORAL | 3 refills | Status: DC

## 2018-12-15 NOTE — Telephone Encounter (Signed)
Rx for Sildenafil 20mg  TID faxed to mail order pharmacy [facility] @ [phone]

## 2018-12-23 ENCOUNTER — Other Ambulatory Visit (HOSPITAL_COMMUNITY): Payer: Self-pay

## 2018-12-23 MED ORDER — SILDENAFIL CITRATE 20 MG PO TABS: 20.0000 mg | ORAL_TABLET | Freq: Three times a day (TID) | ORAL | 3 refills | Status: DC

## 2018-12-24 ENCOUNTER — Ambulatory Visit (INDEPENDENT_AMBULATORY_CARE_PROVIDER_SITE_OTHER): Payer: BLUE CROSS/BLUE SHIELD | Admitting: Pharmacist

## 2018-12-24 DIAGNOSIS — Z954 Presence of other heart-valve replacement: Secondary | ICD-10-CM | POA: Diagnosis not present

## 2018-12-24 DIAGNOSIS — I4891 Unspecified atrial fibrillation: Secondary | ICD-10-CM

## 2018-12-24 DIAGNOSIS — Z5181 Encounter for therapeutic drug level monitoring: Secondary | ICD-10-CM

## 2018-12-24 LAB — POCT INR: INR: 3.5 — AB (ref 2.0–3.0)

## 2018-12-24 NOTE — Patient Instructions (Signed)
Description   Continue coumadin 4mg  daily Recheck in 3 weeks Pending 3 dental extractions.  Not scheduled yet. ([place]) Planning on January. Will need Lovenox bridge

## 2018-12-25 ENCOUNTER — Telehealth (HOSPITAL_COMMUNITY): Payer: Self-pay

## 2018-12-25 NOTE — Telephone Encounter (Signed)
Prior authorization through [place] was APPROVED for Sildenafil Citrate and will expire on 12/16/2021.

## 2019-01-14 ENCOUNTER — Encounter (HOSPITAL_COMMUNITY): Payer: Self-pay

## 2019-01-14 ENCOUNTER — Ambulatory Visit (INDEPENDENT_AMBULATORY_CARE_PROVIDER_SITE_OTHER): Payer: BLUE CROSS/BLUE SHIELD | Admitting: *Deleted

## 2019-01-14 ENCOUNTER — Encounter (HOSPITAL_COMMUNITY)
Admission: RE | Admit: 2019-01-14 | Discharge: 2019-01-14 | Disposition: A | Discharge status: Home / Self Care | Payer: BLUE CROSS/BLUE SHIELD | Source: Ambulatory Visit | Attending: Internal Medicine | Admitting: Internal Medicine

## 2019-01-14 VITALS — BP 112/78 | HR 78 | Ht 71.0 in | Wt 295.6 lb

## 2019-01-14 DIAGNOSIS — Z954 Presence of other heart-valve replacement: Secondary | ICD-10-CM

## 2019-01-14 DIAGNOSIS — I4891 Unspecified atrial fibrillation: Secondary | ICD-10-CM | POA: Diagnosis not present

## 2019-01-14 DIAGNOSIS — Z952 Presence of prosthetic heart valve: Secondary | ICD-10-CM | POA: Diagnosis not present

## 2019-01-14 DIAGNOSIS — Z9889 Other specified postprocedural states: Secondary | ICD-10-CM | POA: Diagnosis not present

## 2019-01-14 DIAGNOSIS — Z5181 Encounter for therapeutic drug level monitoring: Secondary | ICD-10-CM | POA: Diagnosis not present

## 2019-01-14 LAB — POCT INR: INR: 2.2 (ref 2.0–3.0)

## 2019-01-14 NOTE — Progress Notes (Signed)
Cardiac Individual Treatment Plan  Patient Details  Name: Jeffrey Larson MRN: [id number] Date of Birth: Apr 17, 1956 Referring Provider:     CARDIAC REHAB [place] from 01/14/2019 in [place]  Referring Provider  Adin      Initial Encounter Date:    CARDIAC REHAB PHASE II ORIENTATION from 01/14/2019 in [place]  Date  01/14/19      Visit Diagnosis: S/P mitral valve replacement  S/P tricuspid valve repair  Patient's Home Medications on Admission:  Current Outpatient Medications:  .  acetaminophen (TYLENOL) 500 MG tablet, Take 1,000 mg by mouth 2 (two) times daily., Disp: , Rfl:  .  Albuterol Sulfate (PROAIR RESPICLICK) 673 (90 Base) MCG/ACT AEPB, Inhale 1 puff into the lungs every 6 (six) hours as needed (sob)., Disp: 1 each, Rfl: 1 .  AMBULATORY NON FORMULARY MEDICATION, Medication Name: Glucometer, lancets and strip to test every other day. Dx: Diabetes type 2, E11.9. 100 strip and 100 lancets, Disp: 1 Units, Rfl: PRN .  amoxicillin (AMOXIL) 500 MG capsule, Take 4 capsules by mouth 1 hour prior to dental work., Disp: 4 capsule, Rfl: 3 .  aspirin 81 MG tablet, Take 81 mg by mouth daily., Disp: , Rfl:  .  atorvastatin (LIPITOR) 40 MG tablet, Take 40 mg by mouth every evening. , Disp: , Rfl: 2 .  benzonatate (TESSALON) 200 MG capsule, Take 1 capsule (200 mg total) by mouth 3 (three) times daily as needed for cough., Disp: 30 capsule, Rfl: 0 .  Fluticasone-Umeclidin-Vilant (TRELEGY ELLIPTA) 100-62.5-25 MCG/INH AEPB, Inhale 1 puff into the lungs daily., Disp: 1 each, Rfl: 5 .  ipratropium (ATROVENT) 0.06 % nasal spray, Place 2 sprays into both nostrils 4 (four) times daily. (Patient not taking: Reported on 01/14/2019), Disp: 15 mL, Rfl: 1 .  metoprolol succinate (TOPROL-XL) 50 MG 24 hr tablet, Take 1 tablet (50 mg total) by mouth daily. Take with or immediately following a meal., Disp: 30 tablet, Rfl: 3 .  nitroGLYCERIN  (NITROSTAT) 0.4 MG SL tablet, Place 1 tablet (0.4 mg total) under the tongue every 5 (five) minutes x 3 doses as needed for chest pain., Disp: 25 tablet, Rfl: 3 .  Omega-3 Fatty Acids (FISH OIL) 1200 MG CAPS, Take 1,200 mg by mouth daily., Disp: , Rfl:  .  oxymetazoline (AFRIN) 0.05 % nasal spray, Place 1 spray into both nostrils daily., Disp: , Rfl:  .  predniSONE (DELTASONE) 20 MG tablet, Take 1 tablet (20 mg total) by mouth 2 (two) times daily with a meal. (Patient not taking: Reported on 01/14/2019), Disp: 10 tablet, Rfl: 0 .  sacubitril-valsartan (ENTRESTO) 24-26 MG, Take 1 tablet by mouth 2 (two) times daily., Disp: 180 tablet, Rfl: 1 .  sildenafil (REVATIO) 20 MG tablet, Take 1 tablet (20 mg total) by mouth 3 (three) times daily., Disp: 270 tablet, Rfl: 3 .  spironolactone (ALDACTONE) 25 MG tablet, Take 1 tablet (25 mg total) by mouth daily., Disp: 30 tablet, Rfl: 6 .  torsemide (DEMADEX) 20 MG tablet, Take 2 tablets (40 mg total) by mouth daily., Disp: 60 tablet, Rfl: 11 .  traMADol (ULTRAM) 50 MG tablet, Take 1 tablet (50 mg total) by mouth every 6 (six) hours as needed for moderate pain. (Patient not taking: Reported on 01/14/2019), Disp: 20 tablet, Rfl: 0 .  warfarin (COUMADIN) 4 MG tablet, Take 4 mg by mouth as directed., Disp: , Rfl:   Past Medical History: Past Medical History:  Diagnosis Date  .  Chronic combined systolic and diastolic congestive heart failure ([place])   . COPD with chronic bronchitis ([place])   . Coronary artery disease   . Essential hypertension, benign    Ramipril to losartan Sept 2015   . History of pneumonia    RML on CXR 07/20/14  . Hyperlipidemia   . Longstanding persistent atrial fibrillation   . Mitral stenosis with regurgitation   . Myocardial infarction ([place])   . Pulmonary hypertension ([place])   . Renal disorder    history kidney stone  . S/P Maze operation for atrial fibrillation 07/18/2018   Complete bilateral atrial lesion set using bipolar radiofrequency  and cryothermy ablation with clipping of LA appendage  . S/P mitral valve replacement with metallic valve 03/04/8118   Sorin [employer]ics Optiform bileaflet mechanical valve, size 33 mm  . S/P TVR (tricuspid valve repair) 07/18/2018   [facility]wards mc3 ring annuloplasty, size 30 mm    Tobacco Use: Social History   Tobacco Use  Smoking Status Former Smoker  . Packs/day: 2.00  . Years: 15.00  . Pack years: 30.00  . Types: Cigarettes  . Last attempt to quit: 11/26/1996  . Years since quitting: 22.1  Smokeless Tobacco Never Used    Labs: Recent Review Flowsheet Data    Labs for ITP Cardiac and Pulmonary Rehab Latest Ref Rng & Units 07/21/2018 07/28/2018 07/29/2018 07/30/2018 12/08/2018   Cholestrol <200 mg/dL - - - - 139   LDLCALC mg/dL (calc) - - - - 77   HDL >40 mg/dL - - - - 33(L)   Trlycerides <150 mg/dL - - - - 194(H)   Hemoglobin A1c <5.7 % of total Hgb - - - - 5.6   PHART 7.350 - 7.450 - - - - -   PCO2ART 32.0 - 48.0 mmHg - - - - -   HCO3 20.0 - 28.0 mmol/L - - - - -   TCO2 22 - 32 mmol/L 27 - - - -   ACIDBASEDEF 0.0 - 2.0 mmol/L - - - - -   O2SAT % 68.5 58.5 58.3 59.1 -      Capillary Blood Glucose: Lab Results  Component Value Date   GLUCAP 114 (H) 07/25/2018   GLUCAP 101 (H) 07/25/2018   GLUCAP 102 (H) 07/25/2018   GLUCAP 147 (H) 07/24/2018   GLUCAP 87 07/24/2018     Exercise Target Goals: Exercise Program Goal: Individual exercise prescription set using results from initial 6 min walk test and THRR while considering  patient's activity barriers and safety.   Exercise Prescription Goal: Starting with aerobic activity 30 plus minutes a day, 3 days per week for initial exercise prescription. Provide home exercise prescription and guidelines that participant acknowledges understanding prior to discharge.  Activity Barriers & Risk Stratification: Activity Barriers & Cardiac Risk Stratification - 01/14/19 1348      Activity Barriers & Cardiac Risk Stratification   Activity  Barriers  None    Cardiac Risk Stratification  High       6 Minute Walk: 6 Minute Walk    Row Name 01/14/19 1346         6 Minute Walk   Phase  Initial     Distance  1300 feet     Walk Time  6 minutes     # of Rest Breaks  0     MPH  2.46     METS  2.88     RPE  9     Perceived Dyspnea   7  VO2 Peak  9.23     Symptoms  No     Resting HR  78 bpm     Resting BP  112/78     Resting Oxygen Saturation   97 %     Exercise Oxygen Saturation  during 6 min walk  97 %     Max Ex. HR  116 bpm     Max Ex. BP  128/74     2 Minute Post BP  114/76        Oxygen Initial Assessment:   Oxygen Re-Evaluation:   Oxygen Discharge (Final Oxygen Re-Evaluation):   Initial Exercise Prescription: Initial Exercise Prescription - 01/14/19 1300      Date of Initial Exercise RX and Referring Provider   Date  01/14/19    Referring Provider  Bensimhon    Expected Discharge Date  04/14/19      Treadmill   MPH  1.4    Grade  0    Minutes  17    METs  2.07      Recumbant Elliptical   Level  1    RPM  40    Watts  35    Minutes  17    METs  2      Prescription Details   Frequency (times per week)  3    Duration  Progress to 30 minutes of continuous aerobic without signs/symptoms of physical distress      Intensity   THRR 40-80% of Max Heartrate  [phone]    Ratings of Perceived Exertion  11-13    Perceived Dyspnea  0-4      Progression   Progression  Continue to progress workloads to maintain intensity without signs/symptoms of physical distress.      Resistance Training   Training Prescription  Yes    Weight  1    Reps  10-15       Perform Capillary Blood Glucose checks as needed.  Exercise Prescription Changes:  Exercise Prescription Changes    Row Name 01/14/19 1300             Response to Exercise   Blood Pressure (Admit)  112/78       Blood Pressure (Exercise)  128/74       Blood Pressure (Exit)  114/76       Heart Rate (Admit)  78 bpm        Heart Rate (Exercise)  116 bpm       Heart Rate (Exit)  99 bpm       Oxygen Saturation (Admit)  97 %       Oxygen Saturation (Exercise)  97 %       Oxygen Saturation (Exit)  98 %       Rating of Perceived Exertion (Exercise)  9       Perceived Dyspnea (Exercise)  7       Comments  6 minute walk test        Duration  Progress to 30 minutes of  aerobic without signs/symptoms of physical distress       Intensity  THRR New [phone]         Home Exercise Plan   Plans to continue exercise at  Home (comment)       Frequency  Add 2 additional days to program exercise sessions.       Initial Home Exercises Provided  01/14/19          Exercise Comments:   Exercise Goals  and Review:  Exercise Goals    Row Name 01/14/19 1352             Exercise Goals   Increase Physical Activity  Yes       Intervention  Develop an individualized exercise prescription for aerobic and resistive training based on initial evaluation findings, risk stratification, comorbidities and participant's personal goals.;Provide advice, education, support and counseling about physical activity/exercise needs.       Expected Outcomes  Short Term: Attend rehab on a regular basis to increase amount of physical activity.;Long Term: Add in home exercise to make exercise part of routine and to increase amount of physical activity.;Long Term: Exercising regularly at least 3-5 days a week.       Increase Strength and Stamina  Yes       Intervention  Provide advice, education, support and counseling about physical activity/exercise needs.;Develop an individualized exercise prescription for aerobic and resistive training based on initial evaluation findings, risk stratification, comorbidities and participant's personal goals.       Expected Outcomes  Short Term: Increase workloads from initial exercise prescription for resistance, speed, and METs.;Short Term: Perform resistance training exercises routinely during rehab and add in  resistance training at home;Long Term: Improve cardiorespiratory fitness, muscular endurance and strength as measured by increased METs and functional capacity (6MWT)       Able to understand and use rate of perceived exertion (RPE) scale  Yes       Intervention  Provide education and explanation on how to use RPE scale       Expected Outcomes  Short Term: Able to use RPE daily in rehab to express subjective intensity level;Long Term:  Able to use RPE to guide intensity level when exercising independently       Able to understand and use Dyspnea scale  Yes       Intervention  Provide education and explanation on how to use Dyspnea scale       Expected Outcomes  Short Term: Able to use Dyspnea scale daily in rehab to express subjective sense of shortness of breath during exertion;Long Term: Able to use Dyspnea scale to guide intensity level when exercising independently       Knowledge and understanding of Target Heart Rate Range (THRR)  Yes       Intervention  Provide education and explanation of THRR including how the numbers were predicted and where they are located for reference       Expected Outcomes  Short Term: Able to state/look up THRR       Able to check pulse independently  Yes       Intervention  Provide education and demonstration on how to check pulse in carotid and radial arteries.;Review the importance of being able to check your own pulse for safety during independent exercise       Expected Outcomes  Short Term: Able to explain why pulse checking is important during independent exercise;Long Term: Able to check pulse independently and accurately       Understanding of Exercise Prescription  Yes       Intervention  Provide education, explanation, and written materials on patient's individual exercise prescription       Expected Outcomes  Short Term: Able to explain program exercise prescription;Long Term: Able to explain home exercise prescription to exercise independently           Exercise Goals Re-Evaluation :    Discharge Exercise Prescription (Final Exercise Prescription Changes): Exercise Prescription  Changes - 01/14/19 1300      Response to Exercise   Blood Pressure (Admit)  112/78    Blood Pressure (Exercise)  128/74    Blood Pressure (Exit)  114/76    Heart Rate (Admit)  78 bpm    Heart Rate (Exercise)  116 bpm    Heart Rate (Exit)  99 bpm    Oxygen Saturation (Admit)  97 %    Oxygen Saturation (Exercise)  97 %    Oxygen Saturation (Exit)  98 %    Rating of Perceived Exertion (Exercise)  9    Perceived Dyspnea (Exercise)  7    Comments  6 minute walk test     Duration  Progress to 30 minutes of  aerobic without signs/symptoms of physical distress    Intensity  THRR New   [phone]     Home Exercise Plan   Plans to continue exercise at  Home (comment)    Frequency  Add 2 additional days to program exercise sessions.    Initial Home Exercises Provided  01/14/19       Nutrition:  Target Goals: Understanding of nutrition guidelines, daily intake of sodium 1500mg , cholesterol 200mg , calories 30% from fat and 7% or less from saturated fats, daily to have 5 or more servings of fruits and vegetables.  Biometrics: Pre Biometrics - 01/14/19 1352      Pre Biometrics   Height  5\' 11"  (1.803 m)    Waist Circumference  55 inches    Hip Circumference  46 inches    Waist to Hip Ratio  1.2 %    Triceps Skinfold  15 mm    % Body Fat  39.1 %    Grip Strength  14.8 kg    Single Leg Stand  60 seconds        Nutrition Therapy Plan and Nutrition Goals: Nutrition Therapy & Goals - 01/14/19 1446      Personal Nutrition Goals   Personal Goal #2  Patient is still working on eating more heart healthy. Handout given to help with better food choices. Patient expressed an understanding.     Additional Goals?  No       Nutrition Assessments: Nutrition Assessments - 01/14/19 1447      MEDFICTS Scores   Pre Score  108       Nutrition Goals  Re-Evaluation:   Nutrition Goals Discharge (Final Nutrition Goals Re-Evaluation):   Psychosocial: Target Goals: Acknowledge presence or absence of significant depression and/or stress, maximize coping skills, provide positive support system. Participant is able to verbalize types and ability to use techniques and skills needed for reducing stress and depression.  Initial Review & Psychosocial Screening: Initial Psych Review & Screening - 01/14/19 1445      Initial Review   Current issues with  None Identified      Family Dynamics   Good Support System?  Yes      Barriers   Psychosocial barriers to participate in program  There are no identifiable barriers or psychosocial needs.      Screening Interventions   Interventions  Encouraged to exercise    Expected Outcomes  Short Term goal: Identification and review with participant of any Quality of Life or Depression concerns found by scoring the questionnaire.;Long Term goal: The participant improves quality of Life and PHQ9 Scores as seen by post scores and/or verbalization of changes       Quality of Life Scores: Quality of Life - 01/14/19 1354  Quality of Life   Select  Quality of Life      Quality of Life Scores   Health/Function Pre  29.6 %    Socioeconomic Pre  28.93 %    Psych/Spiritual Pre  28.29 %    Family Pre  28.8 %    GLOBAL Pre  29.07 %      Scores of 19 and below usually indicate a poorer quality of life in these areas.  A difference of  2-3 points is a clinically meaningful difference.  A difference of 2-3 points in the total score of the Quality of Life Index has been associated with significant improvement in overall quality of life, self-image, physical symptoms, and general health in studies assessing change in quality of life.  PHQ-9: Recent Review Flowsheet Data    Depression screen [facility] 2/9 01/14/2019 08/02/2017   Decreased Interest 0 0   Down, Depressed, Hopeless 0 0   PHQ - 2 Score 0 0   Altered  sleeping 0 -   Tired, decreased energy 0 -   Change in appetite 0 -   Feeling bad or failure about yourself  0 -   Trouble concentrating 0 -   Moving slowly or fidgety/restless 0 -   Suicidal thoughts 0 -   PHQ-9 Score 0 -   Difficult doing work/chores Not difficult at all -     Interpretation of Total Score  Total Score Depression Severity:  1-4 = Minimal depression, 5-9 = Mild depression, 10-14 = Moderate depression, 15-19 = Moderately severe depression, 20-27 = Severe depression   Psychosocial Evaluation and Intervention: Psychosocial Evaluation - 01/14/19 1445      Psychosocial Evaluation & Interventions   Interventions  Encouraged to exercise with the program and follow exercise prescription    Continue Psychosocial Services   No Follow up required       Psychosocial Re-Evaluation:   Psychosocial Discharge (Final Psychosocial Re-Evaluation):   Vocational Rehabilitation: Provide vocational rehab assistance to qualifying candidates.   Vocational Rehab Evaluation & Intervention: Vocational Rehab - 01/14/19 1448      Initial Vocational Rehab Evaluation & Intervention   Assessment shows need for Vocational Rehabilitation  No       Education: Education Goals: Education classes will be provided on a weekly basis, covering required topics. Participant will state understanding/return demonstration of topics presented.  Learning Barriers/Preferences: Learning Barriers/Preferences - 01/14/19 1447      Learning Barriers/Preferences   Learning Barriers  None    Learning Preferences  Audio;Group Instruction;Skilled Demonstration;Individual Instruction       Education Topics: Hypertension, Hypertension Reduction -Define heart disease and high blood pressure. Discus how high blood pressure affects the body and ways to reduce high blood pressure.   Exercise and Your Heart -Discuss why it is important to exercise, the FITT principles of exercise, normal and abnormal  responses to exercise, and how to exercise safely.   Angina -Discuss definition of angina, causes of angina, treatment of angina, and how to decrease risk of having angina.   Cardiac Medications -Review what the following cardiac medications are used for, how they affect the body, and side effects that may occur when taking the medications.  Medications include Aspirin, Beta blockers, calcium channel blockers, ACE Inhibitors, angiotensin receptor blockers, diuretics, digoxin, and antihyperlipidemics.   Congestive Heart Failure -Discuss the definition of CHF, how to live with CHF, the signs and symptoms of CHF, and how keep track of weight and sodium intake.   Heart Disease and  Intimacy -Discus the effect sexual activity has on the heart, how changes occur during intimacy as we age, and safety during sexual activity.   Smoking Cessation / COPD -Discuss different methods to quit smoking, the health benefits of quitting smoking, and the definition of COPD.   Nutrition I: Fats -Discuss the types of cholesterol, what cholesterol does to the heart, and how cholesterol levels can be controlled.   Nutrition II: Labels -Discuss the different components of food labels and how to read food label   Heart Parts/Heart Disease and PAD -Discuss the anatomy of the heart, the pathway of blood circulation through the heart, and these are affected by heart disease.   Stress I: Signs and Symptoms -Discuss the causes of stress, how stress may lead to anxiety and depression, and ways to limit stress.   Stress II: Relaxation -Discuss different types of relaxation techniques to limit stress.   Warning Signs of Stroke / TIA -Discuss definition of a stroke, what the signs and symptoms are of a stroke, and how to identify when someone is having stroke.   Knowledge Questionnaire Score: Knowledge Questionnaire Score - 01/14/19 1447      Knowledge Questionnaire Score   Pre Score  23/24        Core Components/Risk Factors/Patient Goals at Admission: Personal Goals and Risk Factors at Admission - 01/14/19 1448      Core Components/Risk Factors/Patient Goals on Admission    Weight Management  Yes    Admit Weight  295 lb 9.6 oz (134.1 kg)    Goal Weight: Short Term  285 lb 9.6 oz (129.5 kg)    Goal Weight: Long Term  275 lb 9.6 oz (125 kg)    Expected Outcomes  Short Term: Continue to assess and modify interventions until short term weight is achieved;Long Term: Adherence to nutrition and physical activity/exercise program aimed toward attainment of established weight goal    Personal Goal Other  Yes    Personal Goal  Lose 50lbs. and keep it off, gain heart health    Intervention  Attend program 3 x week and supplement with at home exercise 2 x week.     Expected Outcomes  Reach personal goals       Core Components/Risk Factors/Patient Goals Review:    Core Components/Risk Factors/Patient Goals at Discharge (Final Review):    ITP Comments:   Comments: Patient arrived for 1st visit/orientation/education at 1230. Patient was referred to CR by Dr. Haroldine Laws due to MVR (Z95.4) and TVR (Z98.890). During orientation advised patient on arrival and appointment times what to wear, what to do before, during and after exercise. Reviewed attendance and class policy. Talked about inclement weather and class consultation policy. Pt is scheduled to return Cardiac Rehab on 01/19/2019 at 0815. Pt was advised to come to class 15 minutes before class starts. Patient was also given instructions on meeting with the dietician and attending the Family Structure classes. Discussed RPE/Dpysnea scales. Discussed initial THR and how to find their radial and/or carotid pulse. Discussed the initial exercise prescription and how this effects their progress. Pt is eager to get started. Patient participated in warm up stretches followed by light weights and resistance bands. Patient was able to complete 6  minute walk test. Patient did not c/o pain. Patient was measured for the equipment. Discussed equipment safety with patient. Took patient pre-anthropometric measurements. Patient finished visit at 1415.

## 2019-01-14 NOTE — Progress Notes (Signed)
Cardiac/Pulmonary Rehab Medication Review by a Pharmacist  Does the patient  feel that his/her medications are working for him/her?  yes  Has the patient been experiencing any side effects to the medications prescribed?  no  Does the patient measure his/her own blood pressure or blood glucose at home?  yes   Does the patient have any problems obtaining medications due to transportation or finances?   no  Understanding of regimen: excellent Understanding of indications: good Potential of compliance: excellent  Questions asked to Determine Patient Understanding of Medication Regimen:  1. What is the name of the medication?  2. What is the medication used for?  3. When should it be taken?  4. How much should be taken?  5. How will you take it?  6. What side effects should you report?  Understanding Defined as: Excellent: All questions above are correct Good: Questions 1-4 are correct Fair: Questions 1-2 are correct  Poor: 1 or none of the above questions are correct   Pharmacist comments: Jeffrey Larson presents today for cardiac rehav. We reviewed his medication and he is tolerating current regimen. He monitors his BP and BS. He said he is feeling so much better than he has in a several years. He was concerned about chronic tylenol usage BID for arthritic pain. Will have to continue to monitor LFTS and with atorvastatin, will monitor.  Continue current regimen.  Thanks for the opportunity to participate in the care of this patient,  Isac Sarna, BS [place], [place] Pharmacist Pager [phone] 01/14/2019 2:01 PM

## 2019-01-14 NOTE — Patient Instructions (Signed)
Take 8mg  tonight then resume coumadin 4mg  daily Recheck in 3 weeks Pending 3 dental extractions.  Not scheduled yet. (Pump Back) Will need Lovenox bridge

## 2019-01-19 ENCOUNTER — Encounter (HOSPITAL_COMMUNITY)
Admission: RE | Admit: 2019-01-19 | Discharge: 2019-01-19 | Disposition: A | Discharge status: Home / Self Care | Payer: BLUE CROSS/BLUE SHIELD | Source: Ambulatory Visit | Attending: Internal Medicine | Admitting: Internal Medicine

## 2019-01-19 DIAGNOSIS — Z952 Presence of prosthetic heart valve: Secondary | ICD-10-CM

## 2019-01-19 DIAGNOSIS — Z9889 Other specified postprocedural states: Secondary | ICD-10-CM | POA: Diagnosis not present

## 2019-01-19 NOTE — Progress Notes (Signed)
Daily Session Note  Patient Details  Name: TYE VIGO MRN: [id number] Date of Birth: 03-12-1956 Referring Provider:     CARDIAC REHAB PHASE II ORIENTATION from 01/14/2019 in [place]  Referring Provider  Bensimhon      Encounter Date: 01/19/2019  Check In: Session Check In - 01/19/19 0815      Check-In   Supervising physician immediately available to respond to emergencies  See telemetry face sheet for immediately available MD    Location  [facility]    Staff Present  Benay Pike, Exercise Physiologist;Debra Wynetta Emery, RN, BSN    Medication changes reported      No    Fall or balance concerns reported     No    Warm-up and Cool-down  Performed as group-led instruction    Resistance Training Performed  Yes    VAD Patient?  No    PAD/SET Patient?  No      Pain Assessment   Currently in Pain?  No/denies    Pain Score  0-No pain    Multiple Pain Sites  No       Capillary Blood Glucose: No results found for this or any previous visit (from the past 24 hour(s)).    Social History   Tobacco Use  Smoking Status Former Smoker  . Packs/day: 2.00  . Years: 15.00  . Pack years: 30.00  . Types: Cigarettes  . Last attempt to quit: 11/26/1996  . Years since quitting: 22.1  Smokeless Tobacco Never Used    Goals Met:  Proper associated with RPD/PD & O2 Sat Independence with exercise equipment Exercise tolerated well No report of cardiac concerns or symptoms Strength training completed today  Goals Unmet:  Not Applicable  Comments: Pt able to follow exercise prescription today without complaint.  Will continue to monitor for progression. Check out 0915.   Dr. Kate Sable is Medical Director for [facility] Cardiac and Pulmonary Rehab.

## 2019-01-20 ENCOUNTER — Ambulatory Visit (INDEPENDENT_AMBULATORY_CARE_PROVIDER_SITE_OTHER): Payer: BLUE CROSS/BLUE SHIELD | Admitting: Cardiovascular Disease

## 2019-01-20 ENCOUNTER — Other Ambulatory Visit: Payer: Self-pay

## 2019-01-20 ENCOUNTER — Encounter: Payer: Self-pay | Admitting: Cardiovascular Disease

## 2019-01-20 VITALS — BP 140/76 | HR 78 | Ht 71.0 in | Wt 302.0 lb

## 2019-01-20 DIAGNOSIS — I2721 Secondary pulmonary arterial hypertension: Secondary | ICD-10-CM

## 2019-01-20 DIAGNOSIS — Z9889 Other specified postprocedural states: Secondary | ICD-10-CM

## 2019-01-20 DIAGNOSIS — Z954 Presence of other heart-valve replacement: Secondary | ICD-10-CM

## 2019-01-20 DIAGNOSIS — E785 Hyperlipidemia, unspecified: Secondary | ICD-10-CM

## 2019-01-20 DIAGNOSIS — I25118 Atherosclerotic heart disease of native coronary artery with other forms of angina pectoris: Secondary | ICD-10-CM | POA: Diagnosis not present

## 2019-01-20 DIAGNOSIS — I4821 Permanent atrial fibrillation: Secondary | ICD-10-CM

## 2019-01-20 DIAGNOSIS — I5032 Chronic diastolic (congestive) heart failure: Secondary | ICD-10-CM

## 2019-01-20 DIAGNOSIS — I10 Essential (primary) hypertension: Secondary | ICD-10-CM

## 2019-01-20 MED ORDER — WARFARIN SODIUM 4 MG PO TABS: 4.0000 mg | ORAL_TABLET | ORAL | 3 refills | Status: DC

## 2019-01-20 MED ORDER — ATORVASTATIN CALCIUM 40 MG PO TABS: 40.0000 mg | ORAL_TABLET | Freq: Every evening | ORAL | 3 refills | Status: DC

## 2019-01-20 NOTE — Progress Notes (Signed)
SUBJECTIVE: The patient presents to reestablish care with me.  I last saw him while hospitalized in December 2017.  He was seen by Dr. Haroldine Laws in the office on 12/03/2018.  In summary, past medical history includes obesity, COPD, permanent atrial fibrillation, coronary artery disease, rheumatic mitral stenosis, tricuspid regurgitation, and chronic combined systolic and diastolic heart failure.  In 2013 he suffered an acute MI and was treated with multivessel PCI and stenting.  He underwent a sleep study which showed mild sleep apnea.  CPAP was not recommended.  He underwent cardiac catheterization on 06/10/2018.  He had nonobstructive disease with a mid RCA 50% stenosis and otherwise 20% stenosis seen in the proximal left circumflex and ostial to proximal LAD.  On 07/18/2018 he underwent mitral valve replacement with mechanical mitral valve, tricuspid valve repair using an Edwards mc3 ring annuloplasty , and Maze procedure.  Echocardiogram on 12/01/2018 showed normal left ventricular systolic function, LVEF 55 to 60%, mild LVH, normally functioning Sorin Carbomedics Optiform bileaflet 33 mm mechanical valve, severe left atrial dilatation, and moderate right ventricular dilatation with moderately reduced systolic function.  The tricuspid valve was not well visualized.  He is doing very well and said he feels like he is 42 years younger after undergoing mitral valve replacement surgery.  He denies chest pain.  Chronic exertional dyspnea from COPD is stable.  He is participating in cardiac rehabilitation and enjoying it.  He wants to lose weight.  He denies leg swelling, orthopnea, paroxysmal nocturnal dyspnea.      Review of Systems: As per "subjective", otherwise negative.  Allergies  Allergen Reactions  . Ramipril Cough    cough  . Percocet [Oxycodone-Acetaminophen]     In ICU it contributed to hallucinations. Can take by itself    Current Outpatient Medications  Medication Sig  Dispense Refill  . acetaminophen (TYLENOL) 500 MG tablet Take 1,000 mg by mouth 2 (two) times daily.    Marland Kitchen aspirin 81 MG tablet Take 81 mg by mouth daily.    Marland Kitchen atorvastatin (LIPITOR) 40 MG tablet Take 40 mg by mouth every evening.   2  . Fluticasone-Umeclidin-Vilant (TRELEGY ELLIPTA) 100-62.5-25 MCG/INH AEPB Inhale 1 puff into the lungs daily. 1 each 5  . metoprolol succinate (TOPROL-XL) 50 MG 24 hr tablet Take 1 tablet (50 mg total) by mouth daily. Take with or immediately following a meal. 30 tablet 3  . nitroGLYCERIN (NITROSTAT) 0.4 MG SL tablet Place 1 tablet (0.4 mg total) under the tongue every 5 (five) minutes x 3 doses as needed for chest pain. 25 tablet 3  . Omega-3 Fatty Acids (FISH OIL) 1200 MG CAPS Take 1,200 mg by mouth daily.    Marland Kitchen oxymetazoline (AFRIN) 0.05 % nasal spray Place 1 spray into both nostrils daily.    . sacubitril-valsartan (ENTRESTO) 24-26 MG Take 1 tablet by mouth 2 (two) times daily. 180 tablet 1  . sildenafil (REVATIO) 20 MG tablet Take 1 tablet (20 mg total) by mouth 3 (three) times daily. 270 tablet 3  . spironolactone (ALDACTONE) 25 MG tablet Take 1 tablet (25 mg total) by mouth daily. 30 tablet 6  . torsemide (DEMADEX) 20 MG tablet Take 2 tablets (40 mg total) by mouth daily. 60 tablet 11  . warfarin (COUMADIN) 4 MG tablet Take 4 mg by mouth as directed.    . AMBULATORY NON FORMULARY MEDICATION Medication Name: Glucometer, lancets and strip to test every other day. Dx: Diabetes type 2, E11.9. 100 strip and 100  lancets 1 Units PRN  . benzonatate (TESSALON) 200 MG capsule Take 1 capsule (200 mg total) by mouth 3 (three) times daily as needed for cough. (Patient not taking: Reported on 01/20/2019) 30 capsule 0   No current facility-administered medications for this visit.     Past Medical History:  Diagnosis Date  . Chronic combined systolic and diastolic congestive heart failure ([place])   . COPD with chronic bronchitis ([place])   . Coronary artery disease   .  Essential hypertension, benign    Ramipril to losartan Sept 2015   . History of pneumonia    RML on CXR 07/20/14  . Hyperlipidemia   . Longstanding persistent atrial fibrillation   . Mitral stenosis with regurgitation   . Myocardial infarction ([place])   . Pulmonary hypertension ([place])   . Renal disorder    history kidney stone  . S/P Maze operation for atrial fibrillation 07/18/2018   Complete bilateral atrial lesion set using bipolar radiofrequency and cryothermy ablation with clipping of LA appendage  . S/P mitral valve replacement with metallic valve 6/60/6301   Sorin Carbomedics Optiform bileaflet mechanical valve, size 33 mm  . S/P TVR (tricuspid valve repair) 07/18/2018   Edwards mc3 ring annuloplasty, size 30 mm    Past Surgical History:  Procedure Laterality Date  . APPENDECTOMY    . CARDIOVASCULAR STRESS TEST  03/2014   Borderline reversible ischemic changes at the apex.  Normal LV contractility/EF 52%.  . CORONARY STENT PLACEMENT  7/12013  . LEFT HEART CATHETERIZATION WITH CORONARY ANGIOGRAM N/A 06/12/2012   Procedure: LEFT HEART CATHETERIZATION WITH CORONARY ANGIOGRAM;  Surgeon: Clent Demark, MD;  Location: [facility] CATH LAB;  Service: Cardiovascular;  Laterality: N/A;  . MAZE N/A 07/18/2018   Procedure: MAZE;  Surgeon: Rexene Alberts, MD;  Location: [place];  Service: Open Heart Surgery;  Laterality: N/A;  . MITRAL VALVE REPLACEMENT N/A 07/18/2018   Procedure: MITRAL VALVE (MV) REPLACEMENT WITH CARBOMEDICS OPTIFORM MITRAL VALVE SIZE 33.;  Surgeon: Rexene Alberts, MD;  Location: [place];  Service: Open Heart Surgery;  Laterality: N/A;  . PERCUTANEOUS CORONARY STENT INTERVENTION (PCI-S) N/A 06/17/2012   Procedure: PERCUTANEOUS CORONARY STENT INTERVENTION (PCI-S);  Surgeon: Clent Demark, MD;  Location: [facility] CATH LAB;  Service: Cardiovascular;  Laterality: N/A;  . RIGHT/LEFT HEART CATH AND CORONARY ANGIOGRAPHY N/A 06/10/2018   Procedure: RIGHT/LEFT HEART CATH AND CORONARY ANGIOGRAPHY;   Surgeon: Dixie Dials, MD;  Location: [place] CV LAB;  Service: Cardiovascular;  Laterality: N/A;  . TEE WITHOUT CARDIOVERSION N/A 06/10/2018   Procedure: TRANSESOPHAGEAL ECHOCARDIOGRAM (TEE) with bubble study;  Surgeon: Dixie Dials, MD;  Location: [facility] ENDOSCOPY;  Service: Cardiovascular;  Laterality: N/A;  . TEE WITHOUT CARDIOVERSION N/A 07/18/2018   Procedure: TRANSESOPHAGEAL ECHOCARDIOGRAM (TEE);  Surgeon: Rexene Alberts, MD;  Location: [place];  Service: Open Heart Surgery;  Laterality: N/A;  . TRICUSPID VALVE REPLACEMENT N/A 07/18/2018   Procedure: TRICUSPID VALVE REPAIR WITH EDWARDS MC3 TRICUSPID ANNULOPLASTY RING SIZE 30.;  Surgeon: Rexene Alberts, MD;  Location: [place];  Service: Open Heart Surgery;  Laterality: N/A;    Social History   Socioeconomic History  . Marital status: Married    Spouse name: Not on file  . Number of children: Not on file  . Years of education: Not on file  . Highest education level: Not on file  Occupational History  . Not on file  Social Needs  . Financial resource strain: Not on file  . Food insecurity:  Worry: Not on file    Inability: Not on file  . Transportation needs:    Medical: Not on file    Non-medical: Not on file  Tobacco Use  . Smoking status: Former Smoker    Packs/day: 2.00    Years: 15.00    Pack years: 30.00    Types: Cigarettes    Last attempt to quit: 11/26/1996    Years since quitting: 22.1  . Smokeless tobacco: Never Used  Substance and Sexual Activity  . Alcohol use: No  . Drug use: No  . Sexual activity: Yes  Lifestyle  . Physical activity:    Days per week: Not on file    Minutes per session: Not on file  . Stress: Not on file  Relationships  . Social connections:    Talks on phone: Not on file    Gets together: Not on file    Attends religious service: Not on file    Active member of club or organization: Not on file    Attends meetings of clubs or organizations: Not on file    Relationship status: Not  on file  . Intimate partner violence:    Fear of current or ex partner: Not on file    Emotionally abused: Not on file    Physically abused: Not on file    Forced sexual activity: Not on file  Other Topics Concern  . Not on file  Social History Narrative  . Not on file     Vitals:   01/20/19 0903  BP: 140/76  Pulse: 78  SpO2: 98%  Weight: (!) 302 lb (137 kg)  Height: 5\' 11"  (1.803 m)    Wt Readings from Last 3 Encounters:  01/20/19 (!) 302 lb (137 kg)  01/14/19 295 lb 9.6 oz (134.1 kg)  12/08/18 297 lb 1.9 oz (134.8 kg)     PHYSICAL EXAM General: NAD HEENT: Normal. Neck: No JVD, no thyromegaly. Lungs: Clear to auscultation bilaterally with normal respiratory effort. CV: Regular rate and irregular rhythm, normal mechanical S1/S2, no S3, no murmur. No pretibial or periankle edema.  No carotid bruit.   Abdomen: Soft, nontender, no distention.  Neurologic: Alert and oriented.  Psych: Normal affect. Skin: Normal. Musculoskeletal: No gross deformities.    ECG: Reviewed above under Subjective   Labs: Lab Results  Component Value Date/Time   K 4.5 12/08/2018 09:56 AM   BUN 23 12/08/2018 09:56 AM   BUN 20 09/08/2018 03:45 PM   CREATININE 1.02 12/08/2018 09:56 AM   ALT 18 12/08/2018 09:56 AM   TSH 1.30 12/08/2018 09:56 AM   HGB 12.9 (L) 12/08/2018 09:56 AM     Lipids: Lab Results  Component Value Date/Time   LDLCALC 77 12/08/2018 09:56 AM   CHOL 139 12/08/2018 09:56 AM   TRIG 194 (H) 12/08/2018 09:56 AM   HDL 33 (L) 12/08/2018 09:56 AM       ASSESSMENT AND PLAN: 1.  Severe mitral stenosis: Status post mitral valve replacement with a Sorin Carbomedics Optiform bileaflet 33 mm mechanical valve on 07/18/2018.  Stable and functioning normally by echocardiogram in January 2020.  2.  Tricuspid regurgitation: Status post tricuspid valve repair on 07/18/2018 using an Edwards mc3 ring annuloplasty.  3.  Chronic diastolic heart failure: LVEF normal by echocardiogram  on 12/01/2018.  Euvolemic.  Continue Toprol-XL, Entresto, torsemide, and spironolactone.  4.  Pulmonary hypertension: Currently on sildenafil and torsemide.  Mild sleep apnea noted.  5.  Permanent atrial fibrillation: Heart rate is  controlled on Toprol-XL.  Anticoagulated with warfarin.  6.  Coronary artery disease: Symptomatically stable.  Currently on aspirin, atorvastatin, and Toprol-XL.  Symptomatically stable.  History of multivessel stenting.  Most recent coronary angiogram in July 2019 detailed above with nonobstructive disease noted.  7.  Morbid obesity: He needs significant weight loss.  He is participating in cardiac rehabilitation.  8.  Hyperlipidemia: Lipids reviewed above.  Continue atorvastatin.  9.  Hypertension: Blood pressure is borderline.  No changes to therapy today.   Disposition: Follow up 6 months  Time spent: 40 minutes, of which greater than 50% was spent reviewing symptoms, relevant blood tests and studies, and discussing management plan with the patient.   Kate Sable, M.D., F.A.C.C.

## 2019-01-20 NOTE — Addendum Note (Signed)
Addended by: Barbarann Ehlers A on: 01/20/2019 09:45 AM   Modules accepted: Orders

## 2019-01-20 NOTE — Patient Instructions (Signed)
Medication Instructions:  Your physician recommends that you continue on your current medications as directed. Please refer to the Current Medication list given to you today.  If you need a refill on your cardiac medications before your next appointment, please call your pharmacy.   Lab work: None today If you have labs (blood work) drawn today and your tests are completely normal, you will receive your results only by: . MyChart Message (if you have MyChart) OR . A paper copy in the mail If you have any lab test that is abnormal or we need to change your treatment, we will call you to review the results.  Testing/Procedures: None today  Follow-Up: At [facility], you and your health needs are our priority.  As part of our continuing mission to provide you with exceptional heart care, we have created designated Provider Care Teams.  These Care Teams include your primary Cardiologist (physician) and Advanced Practice Providers (APPs -  Physician Assistants and Nurse Practitioners) who all work together to provide you with the care you need, when you need it. You will need a follow up appointment in 6 months.  Please call our office 2 months in advance to schedule this appointment.  You may see Suresh Koneswaran, MD or one of the following Advanced Practice Providers on your designated Care Team:   Brittany Strader, PA-C ([place] Office) . Michele Lenze, PA-C ([place] Office)  Any Other Special Instructions Will Be Listed Below (If Applicable). None   

## 2019-01-21 ENCOUNTER — Encounter (HOSPITAL_COMMUNITY)
Admission: RE | Admit: 2019-01-21 | Discharge: 2019-01-21 | Disposition: A | Discharge status: Home / Self Care | Payer: BLUE CROSS/BLUE SHIELD | Source: Ambulatory Visit | Attending: Internal Medicine | Admitting: Internal Medicine

## 2019-01-21 DIAGNOSIS — Z952 Presence of prosthetic heart valve: Secondary | ICD-10-CM

## 2019-01-21 DIAGNOSIS — Z9889 Other specified postprocedural states: Secondary | ICD-10-CM | POA: Diagnosis not present

## 2019-01-21 NOTE — Progress Notes (Signed)
Daily Session Note  Patient Details  Name: Jeffrey Larson MRN: [id number] Date of Birth: 05/07/1956 Referring Provider:     CARDIAC REHAB PHASE II ORIENTATION from 01/14/2019 in [place]  Referring Provider  Bensimhon      Encounter Date: 01/21/2019  Check In: Session Check In - 01/21/19 0815      Check-In   Supervising physician immediately available to respond to emergencies  See telemetry face sheet for immediately available MD    Location  [facility]    Staff Present  Benay Pike, Exercise Physiologist;Debra Wynetta Emery, RN, BSN    Medication changes reported      No    Fall or balance concerns reported     No    Warm-up and Cool-down  Performed as group-led instruction    Resistance Training Performed  Yes    VAD Patient?  No    PAD/SET Patient?  No      Pain Assessment   Currently in Pain?  No/denies    Pain Score  0-No pain    Multiple Pain Sites  No       Capillary Blood Glucose: No results found for this or any previous visit (from the past 24 hour(s)).    Social History   Tobacco Use  Smoking Status Former Smoker  . Packs/day: 2.00  . Years: 15.00  . Pack years: 30.00  . Types: Cigarettes  . Last attempt to quit: 11/26/1996  . Years since quitting: 22.1  Smokeless Tobacco Never Used    Goals Met:  Proper associated with RPD/PD & O2 Sat Independence with exercise equipment Exercise tolerated well No report of cardiac concerns or symptoms Strength training completed today  Goals Unmet:  Not Applicable  Comments: Pt able to follow exercise prescription today without complaint.  Will continue to monitor for progression. Check out 0915.   Dr. Kate Sable is Medical Director for [facility] Cardiac and Pulmonary Rehab.

## 2019-01-23 ENCOUNTER — Encounter (HOSPITAL_COMMUNITY)
Admission: RE | Admit: 2019-01-23 | Discharge: 2019-01-23 | Disposition: A | Discharge status: Home / Self Care | Payer: BLUE CROSS/BLUE SHIELD | Source: Ambulatory Visit | Attending: Internal Medicine | Admitting: Internal Medicine

## 2019-01-23 DIAGNOSIS — Z9889 Other specified postprocedural states: Secondary | ICD-10-CM

## 2019-01-23 DIAGNOSIS — Z952 Presence of prosthetic heart valve: Secondary | ICD-10-CM | POA: Diagnosis not present

## 2019-01-23 NOTE — Progress Notes (Signed)
Daily Session Note  Patient Details  Name: Jeffrey Larson MRN: [id number] Date of Birth: [birth year] Referring Provider:     [place] from 01/14/2019 in [place]  Referring Provider  Bensimhon      Encounter Date: 01/23/2019  Check In: Session Check In - 01/23/19 0815      Check-In   Supervising physician immediately available to respond to emergencies  See telemetry face sheet for immediately available MD    Location  [facility]    Staff Present  Aundra Dubin, RN, BSN;Diane Coad, MS, EP, [facility], Exercise Physiologist;Other    Medication changes reported      No    Fall or balance concerns reported     No    Warm-up and Cool-down  Performed as group-led instruction    Resistance Training Performed  Yes    VAD Patient?  No    PAD/SET Patient?  No      Pain Assessment   Currently in Pain?  No/denies    Pain Score  0-No pain    Multiple Pain Sites  No       Capillary Blood Glucose: No results found for this or any previous visit (from the past 24 hour(s)).    Social History   Tobacco Use  Smoking Status Former Smoker  . Packs/day: 2.00  . Years: 15.00  . Pack years: 30.00  . Types: Cigarettes  . Last attempt to quit: 11/26/1996  . Years since quitting: 22.1  Smokeless Tobacco Never Used    Goals Met:  Independence with exercise equipment Exercise tolerated well No report of cardiac concerns or symptoms Strength training completed today  Goals Unmet:  Not Applicable  Comments: Pt able to follow exercise prescription today without complaint.  Will continue to monitor for progression. Check out 915.   Dr. Kate Sable is Medical Director for [facility] Cardiac and Pulmonary Rehab.

## 2019-01-26 ENCOUNTER — Encounter (HOSPITAL_COMMUNITY)
Admission: RE | Admit: 2019-01-26 | Discharge: 2019-01-26 | Disposition: A | Discharge status: Home / Self Care | Payer: BLUE CROSS/BLUE SHIELD | Source: Ambulatory Visit | Attending: Internal Medicine | Admitting: Internal Medicine

## 2019-01-26 DIAGNOSIS — Z9889 Other specified postprocedural states: Secondary | ICD-10-CM | POA: Diagnosis not present

## 2019-01-26 DIAGNOSIS — Z952 Presence of prosthetic heart valve: Secondary | ICD-10-CM | POA: Diagnosis not present

## 2019-01-26 NOTE — Progress Notes (Signed)
Daily Session Note  Patient Details  Name: Jeffrey Larson MRN: [id number] Date of Birth: Mar 31, 1956 Referring Provider:     CARDIAC REHAB PHASE II ORIENTATION from 01/14/2019 in [place]  Referring Provider  [place]      Encounter Date: 01/26/2019  Check In: Session Check In - 01/26/19 0845      Check-In   Supervising physician immediately available to respond to emergencies  See telemetry face sheet for immediately available MD    Location  [facility]    Staff Present  Aundra Dubin, RN, BSN;Other;Bettylee Feig Zachery Conch, Exercise Physiologist    Medication changes reported      No    Fall or balance concerns reported     No    Warm-up and Cool-down  Performed as group-led instruction    Resistance Training Performed  Yes    VAD Patient?  No    PAD/SET Patient?  No      Pain Assessment   Currently in Pain?  No/denies    Pain Score  0-No pain    Multiple Pain Sites  No       Capillary Blood Glucose: No results found for this or any previous visit (from the past 24 hour(s)).    Social History   Tobacco Use  Smoking Status Former Smoker  . Packs/day: 2.00  . Years: 15.00  . Pack years: 30.00  . Types: Cigarettes  . Last attempt to quit: 11/26/1996  . Years since quitting: 22.1  Smokeless Tobacco Never Used    Goals Met:  Proper associated with RPD/PD & O2 Sat Independence with exercise equipment Exercise tolerated well No report of cardiac concerns or symptoms Strength training completed today  Goals Unmet:  Not Applicable  Comments: Pt able to follow exercise prescription today without complaint.  Will continue to monitor for progression. Check out 0915.   Dr. Kate Sable is Medical Director for [facility] Cardiac and Pulmonary Rehab.

## 2019-01-28 ENCOUNTER — Encounter (HOSPITAL_COMMUNITY)
Admission: RE | Admit: 2019-01-28 | Discharge: 2019-01-28 | Disposition: A | Discharge status: Home / Self Care | Payer: BLUE CROSS/BLUE SHIELD | Source: Ambulatory Visit | Attending: Internal Medicine | Admitting: Internal Medicine

## 2019-01-28 DIAGNOSIS — Z9889 Other specified postprocedural states: Secondary | ICD-10-CM | POA: Diagnosis not present

## 2019-01-28 DIAGNOSIS — Z952 Presence of prosthetic heart valve: Secondary | ICD-10-CM | POA: Diagnosis not present

## 2019-01-28 NOTE — Progress Notes (Signed)
Daily Session Note  Patient Details  Name: ABDULLAHI Larson MRN: [id number] Date of Birth: [birth year] Referring Provider:     CARDIAC REHAB PHASE II ORIENTATION from 01/14/2019 in [place]  Referring Provider  Bensimhon      Encounter Date: 01/28/2019  Check In: Session Check In - 01/28/19 0815      Check-In   Supervising physician immediately available to respond to emergencies  See telemetry face sheet for immediately available MD    Location  [facility]    Staff Present  Aundra Dubin, RN, Cory Munch, Exercise Physiologist;Diane Coad, MS, EP, [facility], Exercise Physiologist    Medication changes reported      No    Fall or balance concerns reported     No    Warm-up and Cool-down  Performed as group-led instruction    Resistance Training Performed  Yes    VAD Patient?  No    PAD/SET Patient?  No      Pain Assessment   Currently in Pain?  No/denies    Pain Score  0-No pain    Multiple Pain Sites  No       Capillary Blood Glucose: No results found for this or any previous visit (from the past 24 hour(s)).    Social History   Tobacco Use  Smoking Status Former Smoker  . Packs/day: 2.00  . Years: 15.00  . Pack years: 30.00  . Types: Cigarettes  . Last attempt to quit: 11/26/1996  . Years since quitting: 22.1  Smokeless Tobacco Never Used    Goals Met:  Independence with exercise equipment Exercise tolerated well No report of cardiac concerns or symptoms Strength training completed today  Goals Unmet:  Not Applicable  Comments: Pt able to follow exercise prescription today without complaint.  Will continue to monitor for progression. Check out 915.   Dr. Kate Sable is Medical Director for [facility] Cardiac and Pulmonary Rehab.

## 2019-01-28 NOTE — Progress Notes (Signed)
Cardiac Individual Treatment Plan  Patient Details  Name: Jeffrey Larson MRN: [id number] Date of Birth: 06-20-1956 Referring Provider:     CARDIAC REHAB [place] from 01/14/2019 in [place]  Referring Provider  [place]      Initial Encounter Date:    CARDIAC REHAB PHASE II ORIENTATION from 01/14/2019 in [facility]  Date  01/14/19      Visit Diagnosis: S/P mitral valve replacement  S/P tricuspid valve repair  Patient's Home Medications on Admission:  Current Outpatient Medications:  .  acetaminophen (TYLENOL) 500 MG tablet, Take 1,000 mg by mouth 2 (two) times daily., Disp: , Rfl:  .  AMBULATORY NON FORMULARY MEDICATION, Medication Name: Glucometer, lancets and strip to test every other day. Dx: Diabetes type 2, E11.9. 100 strip and 100 lancets, Disp: 1 Units, Rfl: PRN .  aspirin 81 MG tablet, Take 81 mg by mouth daily., Disp: , Rfl:  .  atorvastatin (LIPITOR) 40 MG tablet, Take 1 tablet (40 mg total) by mouth every evening., Disp: 90 tablet, Rfl: 3 .  benzonatate (TESSALON) 200 MG capsule, Take 1 capsule (200 mg total) by mouth 3 (three) times daily as needed for cough. (Patient not taking: Reported on 01/20/2019), Disp: 30 capsule, Rfl: 0 .  Fluticasone-Umeclidin-Vilant (TRELEGY ELLIPTA) 100-62.5-25 MCG/INH AEPB, Inhale 1 puff into the lungs daily., Disp: 1 each, Rfl: 5 .  metoprolol succinate (TOPROL-XL) 50 MG 24 hr tablet, Take 1 tablet (50 mg total) by mouth daily. Take with or immediately following a meal., Disp: 30 tablet, Rfl: 3 .  nitroGLYCERIN (NITROSTAT) 0.4 MG SL tablet, Place 1 tablet (0.4 mg total) under the tongue every 5 (five) minutes x 3 doses as needed for chest pain., Disp: 25 tablet, Rfl: 3 .  Omega-3 Fatty Acids (FISH OIL) 1200 MG CAPS, Take 1,200 mg by mouth daily., Disp: , Rfl:  .  oxymetazoline (AFRIN) 0.05 % nasal spray, Place 1 spray into both nostrils daily., Disp: , Rfl:  .  sacubitril-valsartan  (ENTRESTO) 24-26 MG, Take 1 tablet by mouth 2 (two) times daily., Disp: 180 tablet, Rfl: 1 .  sildenafil (REVATIO) 20 MG tablet, Take 1 tablet (20 mg total) by mouth 3 (three) times daily., Disp: 270 tablet, Rfl: 3 .  spironolactone (ALDACTONE) 25 MG tablet, Take 1 tablet (25 mg total) by mouth daily., Disp: 30 tablet, Rfl: 6 .  torsemide (DEMADEX) 20 MG tablet, Take 2 tablets (40 mg total) by mouth daily., Disp: 60 tablet, Rfl: 11 .  warfarin (COUMADIN) 4 MG tablet, Take 1 tablet (4 mg total) by mouth as directed., Disp: 60 tablet, Rfl: 3  Past Medical History: Past Medical History:  Diagnosis Date  . Chronic combined systolic and diastolic congestive heart failure ([place])   . COPD with chronic bronchitis ([place])   . Coronary artery disease   . Essential hypertension, benign    Ramipril to losartan Sept 2015   . History of pneumonia    RML on CXR 07/20/14  . Hyperlipidemia   . Longstanding persistent atrial fibrillation   . Mitral stenosis with regurgitation   . Myocardial infarction ([place])   . Pulmonary hypertension ([place])   . Renal disorder    history kidney stone  . S/P Maze operation for atrial fibrillation 07/18/2018   Complete bilateral atrial lesion set using bipolar radiofrequency and cryothermy ablation with clipping of LA appendage  . S/P mitral valve replacement with metallic valve 7/86/7672   Sorin [employer] Optiform bileaflet mechanical valve, size  33 mm  . S/P TVR (tricuspid valve repair) 07/18/2018   Edwards mc3 ring annuloplasty, size 30 mm    Tobacco Use: Social History   Tobacco Use  Smoking Status Former Smoker  . Packs/day: 2.00  . Years: 15.00  . Pack years: 30.00  . Types: Cigarettes  . Last attempt to quit: 11/26/1996  . Years since quitting: 22.1  Smokeless Tobacco Never Used    Labs: Recent Review Flowsheet Data    Labs for ITP Cardiac and Pulmonary Rehab Latest Ref Rng & Units 07/21/2018 07/28/2018 07/29/2018 07/30/2018 12/08/2018   Cholestrol <200 mg/dL - -  - - 139   LDLCALC mg/dL (calc) - - - - 77   HDL >40 mg/dL - - - - 33(L)   Trlycerides <150 mg/dL - - - - 194(H)   Hemoglobin A1c <5.7 % of total Hgb - - - - 5.6   PHART 7.350 - 7.450 - - - - -   PCO2ART 32.0 - 48.0 mmHg - - - - -   HCO3 20.0 - 28.0 mmol/L - - - - -   TCO2 22 - 32 mmol/L 27 - - - -   ACIDBASEDEF 0.0 - 2.0 mmol/L - - - - -   O2SAT % 68.5 58.5 58.3 59.1 -      Capillary Blood Glucose: Lab Results  Component Value Date   GLUCAP 114 (H) 07/25/2018   GLUCAP 101 (H) 07/25/2018   GLUCAP 102 (H) 07/25/2018   GLUCAP 147 (H) 07/24/2018   GLUCAP 87 07/24/2018     Exercise Target Goals: Exercise Program Goal: Individual exercise prescription set using results from initial 6 min walk test and THRR while considering  patient's activity barriers and safety.   Exercise Prescription Goal: Starting with aerobic activity 30 plus minutes a day, 3 days per week for initial exercise prescription. Provide home exercise prescription and guidelines that participant acknowledges understanding prior to discharge.  Activity Barriers & Risk Stratification: Activity Barriers & Cardiac Risk Stratification - 01/14/19 1348      Activity Barriers & Cardiac Risk Stratification   Activity Barriers  None    Cardiac Risk Stratification  High       6 Minute Walk: 6 Minute Walk    Row Name 01/14/19 1346         6 Minute Walk   Phase  Initial     Distance  1300 feet     Walk Time  6 minutes     # of Rest Breaks  0     MPH  2.46     METS  2.88     RPE  9     Perceived Dyspnea   7     VO2 Peak  9.23     Symptoms  No     Resting HR  78 bpm     Resting BP  112/78     Resting Oxygen Saturation   97 %     Exercise Oxygen Saturation  during 6 min walk  97 %     Max Ex. HR  116 bpm     Max Ex. BP  128/74     2 Minute Post BP  114/76        Oxygen Initial Assessment:   Oxygen Re-Evaluation:   Oxygen Discharge (Final Oxygen Re-Evaluation):   Initial Exercise  Prescription: Initial Exercise Prescription - 01/14/19 1300      Date of Initial Exercise RX and Referring Provider   Date  01/14/19    Referring Provider  Bensimhon    Expected Discharge Date  04/14/19      Treadmill   MPH  1.4    Grade  0    Minutes  17    METs  2.07      Recumbant Elliptical   Level  1    RPM  40    Watts  35    Minutes  17    METs  2      Prescription Details   Frequency (times per week)  3    Duration  Progress to 30 minutes of continuous aerobic without signs/symptoms of physical distress      Intensity   THRR 40-80% of Max Heartrate  110-126-142    Ratings of Perceived Exertion  11-13    Perceived Dyspnea  0-4      Progression   Progression  Continue to progress workloads to maintain intensity without signs/symptoms of physical distress.      Resistance Training   Training Prescription  Yes    Weight  1    Reps  10-15       Perform Capillary Blood Glucose checks as needed.  Exercise Prescription Changes: Exercise Prescription Changes    Row Name 01/14/19 1300             Response to Exercise   Blood Pressure (Admit)  112/78       Blood Pressure (Exercise)  128/74       Blood Pressure (Exit)  114/76       Heart Rate (Admit)  78 bpm       Heart Rate (Exercise)  116 bpm       Heart Rate (Exit)  99 bpm       Oxygen Saturation (Admit)  97 %       Oxygen Saturation (Exercise)  97 %       Oxygen Saturation (Exit)  98 %       Rating of Perceived Exertion (Exercise)  9       Perceived Dyspnea (Exercise)  7       Comments  6 minute walk test        Duration  Progress to 30 minutes of  aerobic without signs/symptoms of physical distress       Intensity  THRR New [phone]         Home Exercise Plan   Plans to continue exercise at  Home (comment)       Frequency  Add 2 additional days to program exercise sessions.       Initial Home Exercises Provided  01/14/19          Exercise Comments: Exercise Comments    Row Name 01/27/19  0800           Exercise Comments  Pt. is new to CR, he has attended 5 sessions. He is walking on the TM and riding the XR. Both machines he has tolerated well and is pushing himself to grow stronger on. We will begin to increase his workloads as tolerated.           Exercise Goals and Review: Exercise Goals    Row Name 01/14/19 1352             Exercise Goals   Increase Physical Activity  Yes       Intervention  Develop an individualized exercise prescription for aerobic and resistive training based on initial evaluation findings, risk stratification, comorbidities and participant's  personal goals.;Provide advice, education, support and counseling about physical activity/exercise needs.       Expected Outcomes  Short Term: Attend rehab on a regular basis to increase amount of physical activity.;Long Term: Add in home exercise to make exercise part of routine and to increase amount of physical activity.;Long Term: Exercising regularly at least 3-5 days a week.       Increase Strength and Stamina  Yes       Intervention  Provide advice, education, support and counseling about physical activity/exercise needs.;Develop an individualized exercise prescription for aerobic and resistive training based on initial evaluation findings, risk stratification, comorbidities and participant's personal goals.       Expected Outcomes  Short Term: Increase workloads from initial exercise prescription for resistance, speed, and METs.;Short Term: Perform resistance training exercises routinely during rehab and add in resistance training at home;Long Term: Improve cardiorespiratory fitness, muscular endurance and strength as measured by increased METs and functional capacity (6MWT)       Able to understand and use rate of perceived exertion (RPE) scale  Yes       Intervention  Provide education and explanation on how to use RPE scale       Expected Outcomes  Short Term: Able to use RPE daily in rehab to express  subjective intensity level;Long Term:  Able to use RPE to guide intensity level when exercising independently       Able to understand and use Dyspnea scale  Yes       Intervention  Provide education and explanation on how to use Dyspnea scale       Expected Outcomes  Short Term: Able to use Dyspnea scale daily in rehab to express subjective sense of shortness of breath during exertion;Long Term: Able to use Dyspnea scale to guide intensity level when exercising independently       Knowledge and understanding of Target Heart Rate Range (THRR)  Yes       Intervention  Provide education and explanation of THRR including how the numbers were predicted and where they are located for reference       Expected Outcomes  Short Term: Able to state/look up THRR       Able to check pulse independently  Yes       Intervention  Provide education and demonstration on how to check pulse in carotid and radial arteries.;Review the importance of being able to check your own pulse for safety during independent exercise       Expected Outcomes  Short Term: Able to explain why pulse checking is important during independent exercise;Long Term: Able to check pulse independently and accurately       Understanding of Exercise Prescription  Yes       Intervention  Provide education, explanation, and written materials on patient's individual exercise prescription       Expected Outcomes  Short Term: Able to explain program exercise prescription;Long Term: Able to explain home exercise prescription to exercise independently          Exercise Goals Re-Evaluation : Exercise Goals Re-Evaluation    Row Name 01/27/19 0758             Exercise Goal Re-Evaluation   Exercise Goals Review  Increase Physical Activity;Increase Strength and Stamina;Able to understand and use rate of perceived exertion (RPE) scale;Knowledge and understanding of Target Heart Rate Range (THRR);Able to check pulse independently;Understanding of  Exercise Prescription       Comments  Pt. is new  to the program, he has attended 5 sessions so far. He has tolerated the exercise well. We will begin in increase his workloads as tolerated.        Expected Outcomes  lose weight and develop heart healthy habits.            Discharge Exercise Prescription (Final Exercise Prescription Changes): Exercise Prescription Changes - 01/14/19 1300      Response to Exercise   Blood Pressure (Admit)  112/78    Blood Pressure (Exercise)  128/74    Blood Pressure (Exit)  114/76    Heart Rate (Admit)  78 bpm    Heart Rate (Exercise)  116 bpm    Heart Rate (Exit)  99 bpm    Oxygen Saturation (Admit)  97 %    Oxygen Saturation (Exercise)  97 %    Oxygen Saturation (Exit)  98 %    Rating of Perceived Exertion (Exercise)  9    Perceived Dyspnea (Exercise)  7    Comments  6 minute walk test     Duration  Progress to 30 minutes of  aerobic without signs/symptoms of physical distress    Intensity  THRR New   [phone]     Home Exercise Plan   Plans to continue exercise at  Home (comment)    Frequency  Add 2 additional days to program exercise sessions.    Initial Home Exercises Provided  01/14/19       Nutrition:  Target Goals: Understanding of nutrition guidelines, daily intake of sodium 1500mg , cholesterol 200mg , calories 30% from fat and 7% or less from saturated fats, daily to have 5 or more servings of fruits and vegetables.  Biometrics: Pre Biometrics - 01/14/19 1352      Pre Biometrics   Height  5\' 11"  (1.803 m)    Waist Circumference  55 inches    Hip Circumference  46 inches    Waist to Hip Ratio  1.2 %    Triceps Skinfold  15 mm    % Body Fat  39.1 %    Grip Strength  14.8 kg    Single Leg Stand  60 seconds        Nutrition Therapy Plan and Nutrition Goals: Nutrition Therapy & Goals - 01/28/19 1507      Nutrition Therapy   RD appointment deferred  Yes      Personal Nutrition Goals   Comments  Patient was reminded  about RD appointment tomorrow. Will continue to monitor for progress.       Intervention Plan   Intervention  Nutrition handout(s) given to patient.       Nutrition Assessments: Nutrition Assessments - 01/14/19 1447      MEDFICTS Scores   Pre Score  108       Nutrition Goals Re-Evaluation:   Nutrition Goals Discharge (Final Nutrition Goals Re-Evaluation):   Psychosocial: Target Goals: Acknowledge presence or absence of significant depression and/or stress, maximize coping skills, provide positive support system. Participant is able to verbalize types and ability to use techniques and skills needed for reducing stress and depression.  Initial Review & Psychosocial Screening: Initial Psych Review & Screening - 01/14/19 1445      Initial Review   Current issues with  None Identified      Family Dynamics   Good Support System?  Yes      Barriers   Psychosocial barriers to participate in program  There are no identifiable barriers or psychosocial needs.  Screening Interventions   Interventions  Encouraged to exercise    Expected Outcomes  Short Term goal: Identification and review with participant of any Quality of Life or Depression concerns found by scoring the questionnaire.;Long Term goal: The participant improves quality of Life and PHQ9 Scores as seen by post scores and/or verbalization of changes       Quality of Life Scores: Quality of Life - 01/14/19 1354      Quality of Life   Select  Quality of Life      Quality of Life Scores   Health/Function Pre  29.6 %    Socioeconomic Pre  28.93 %    Psych/Spiritual Pre  28.29 %    Family Pre  28.8 %    GLOBAL Pre  29.07 %      Scores of 19 and below usually indicate a poorer quality of life in these areas.  A difference of  2-3 points is a clinically meaningful difference.  A difference of 2-3 points in the total score of the Quality of Life Index has been associated with significant improvement in overall  quality of life, self-image, physical symptoms, and general health in studies assessing change in quality of life.  PHQ-9: Recent Review Flowsheet Data    Depression screen [facility] 2/9 01/14/2019 08/02/2017   Decreased Interest 0 0   Down, Depressed, Hopeless 0 0   PHQ - 2 Score 0 0   Altered sleeping 0 -   Tired, decreased energy 0 -   Change in appetite 0 -   Feeling bad or failure about yourself  0 -   Trouble concentrating 0 -   Moving slowly or fidgety/restless 0 -   Suicidal thoughts 0 -   PHQ-9 Score 0 -   Difficult doing work/chores Not difficult at all -     Interpretation of Total Score  Total Score Depression Severity:  1-4 = Minimal depression, 5-9 = Mild depression, 10-14 = Moderate depression, 15-19 = Moderately severe depression, 20-27 = Severe depression   Psychosocial Evaluation and Intervention: Psychosocial Evaluation - 01/14/19 1445      Psychosocial Evaluation & Interventions   Interventions  Encouraged to exercise with the program and follow exercise prescription    Continue Psychosocial Services   No Follow up required       Psychosocial Re-Evaluation: Psychosocial Re-Evaluation    [place] Name 01/28/19 1509             Psychosocial Re-Evaluation   Current issues with  None Identified       Comments  Patient's initial QOL score was 29.07 and his PHQ-9 score was 0 with no psychosocial issues identified.        Expected Outcomes  Patient will have no psychosocial issues identified at discharge.        Interventions  Stress management education;Encouraged to attend Cardiac Rehabilitation for the exercise;Relaxation education       Continue Psychosocial Services   No Follow up required          Psychosocial Discharge (Final Psychosocial Re-Evaluation): Psychosocial Re-Evaluation - 01/28/19 1509      Psychosocial Re-Evaluation   Current issues with  None Identified    Comments  Patient's initial QOL score was 29.07 and his PHQ-9 score was 0 with no  psychosocial issues identified.     Expected Outcomes  Patient will have no psychosocial issues identified at discharge.     Interventions  Stress management education;Encouraged to attend Cardiac Rehabilitation for the exercise;Relaxation  education    Continue Psychosocial Services   No Follow up required       Vocational Rehabilitation: Provide vocational rehab assistance to qualifying candidates.   Vocational Rehab Evaluation & Intervention: Vocational Rehab - 01/14/19 1448      Initial Vocational Rehab Evaluation & Intervention   Assessment shows need for Vocational Rehabilitation  No       Education: Education Goals: Education classes will be provided on a weekly basis, covering required topics. Participant will state understanding/return demonstration of topics presented.  Learning Barriers/Preferences: Learning Barriers/Preferences - 01/14/19 1447      Learning Barriers/Preferences   Learning Barriers  None    Learning Preferences  Audio;Group Instruction;Skilled Demonstration;Individual Instruction       Education Topics: Hypertension, Hypertension Reduction -Define heart disease and high blood pressure. Discus how high blood pressure affects the body and ways to reduce high blood pressure.   Exercise and Your Heart -Discuss why it is important to exercise, the FITT principles of exercise, normal and abnormal responses to exercise, and how to exercise safely.   Angina -Discuss definition of angina, causes of angina, treatment of angina, and how to decrease risk of having angina.   Cardiac Medications -Review what the following cardiac medications are used for, how they affect the body, and side effects that may occur when taking the medications.  Medications include Aspirin, Beta blockers, calcium channel blockers, ACE Inhibitors, angiotensin receptor blockers, diuretics, digoxin, and antihyperlipidemics.   CARDIAC REHAB PHASE II EXERCISE from 01/28/2019 in [place]  Date  01/21/19  Educator  Wynetta Emery  Instruction Review Code  2- Demonstrated Understanding      Congestive Heart Failure -Discuss the definition of CHF, how to live with CHF, the signs and symptoms of CHF, and how keep track of weight and sodium intake.   CARDIAC REHAB PHASE II EXERCISE from 01/28/2019 in [place]  Date  01/28/19  Educator  Orlinda Blalock  Instruction Review Code  2- Demonstrated Understanding      Heart Disease and Intimacy -Discus the effect sexual activity has on the heart, how changes occur during intimacy as we age, and safety during sexual activity.   Smoking Cessation / COPD -Discuss different methods to quit smoking, the health benefits of quitting smoking, and the definition of COPD.   Nutrition I: Fats -Discuss the types of cholesterol, what cholesterol does to the heart, and how cholesterol levels can be controlled.   Nutrition II: Labels -Discuss the different components of food labels and how to read food label   Heart Parts/Heart Disease and PAD -Discuss the anatomy of the heart, the pathway of blood circulation through the heart, and these are affected by heart disease.   Stress I: Signs and Symptoms -Discuss the causes of stress, how stress may lead to anxiety and depression, and ways to limit stress.   Stress II: Relaxation -Discuss different types of relaxation techniques to limit stress.   Warning Signs of Stroke / TIA -Discuss definition of a stroke, what the signs and symptoms are of a stroke, and how to identify when someone is having stroke.   Knowledge Questionnaire Score: Knowledge Questionnaire Score - 01/14/19 1447      Knowledge Questionnaire Score   Pre Score  23/24       Core Components/Risk Factors/Patient Goals at Admission: Personal Goals and Risk Factors at Admission - 01/14/19 1448      Core Components/Risk Factors/Patient Goals on Admission    Weight Management  Yes    Admit Weight  295 lb 9.6 oz (134.1 kg)    Goal Weight: Short Term  285 lb 9.6 oz (129.5 kg)    Goal Weight: Long Term  275 lb 9.6 oz (125 kg)    Expected Outcomes  Short Term: Continue to assess and modify interventions until short term weight is achieved;Long Term: Adherence to nutrition and physical activity/exercise program aimed toward attainment of established weight goal    Personal Goal Other  Yes    Personal Goal  Lose 50lbs. and keep it off, gain heart health    Intervention  Attend program 3 x week and supplement with at home exercise 2 x week.     Expected Outcomes  Reach personal goals       Core Components/Risk Factors/Patient Goals Review:  Goals and Risk Factor Review    Row Name 01/28/19 1507             Core Components/Risk Factors/Patient Goals Review   Personal Goals Review  Weight Management/Obesity Lose 50 lbs; get heart healthy.        Review  Patient has completed 6 sessions gaining 2 lbs since his orientation visit. He says he has not attended enough to notice any progress. Will continue to monitor for progress.        Expected Outcomes  Patient will continue to attend sessions and complete the program meeting his personal goals.           Core Components/Risk Factors/Patient Goals at Discharge (Final Review):  Goals and Risk Factor Review - 01/28/19 1507      Core Components/Risk Factors/Patient Goals Review   Personal Goals Review  Weight Management/Obesity   Lose 50 lbs; get heart healthy.    Review  Patient has completed 6 sessions gaining 2 lbs since his orientation visit. He says he has not attended enough to notice any progress. Will continue to monitor for progress.     Expected Outcomes  Patient will continue to attend sessions and complete the program meeting his personal goals.        ITP Comments:   Comments: ITP REVIEW Patient doing well in the program. Will continue to monitor for progress.

## 2019-01-30 ENCOUNTER — Encounter (HOSPITAL_COMMUNITY)
Admission: RE | Admit: 2019-01-30 | Discharge: 2019-01-30 | Disposition: A | Discharge status: Home / Self Care | Payer: BLUE CROSS/BLUE SHIELD | Source: Ambulatory Visit | Attending: Internal Medicine | Admitting: Internal Medicine

## 2019-01-30 DIAGNOSIS — Z952 Presence of prosthetic heart valve: Secondary | ICD-10-CM

## 2019-01-30 DIAGNOSIS — Z9889 Other specified postprocedural states: Secondary | ICD-10-CM

## 2019-01-30 NOTE — Progress Notes (Signed)
Daily Session Note  Patient Details  Name: Jeffrey Larson MRN: [id number] Date of Birth: May 31, 1956 Referring Provider:     CARDIAC REHAB PHASE II ORIENTATION from 01/14/2019 in [place]  Referring Provider  Bensimhon      Encounter Date: 01/30/2019  Check In: Session Check In - 01/30/19 0815      Check-In   Supervising physician immediately available to respond to emergencies  See telemetry face sheet for immediately available MD    Location  [facility]    Staff Present  Benay Pike, Exercise Physiologist;Diane Coad, MS, EP, [facility], Exercise Physiologist;Other    Medication changes reported      No    Fall or balance concerns reported     No    Warm-up and Cool-down  Performed as group-led instruction    Resistance Training Performed  Yes    VAD Patient?  No    PAD/SET Patient?  No      Pain Assessment   Currently in Pain?  No/denies    Pain Score  0-No pain    Multiple Pain Sites  No       Capillary Blood Glucose: No results found for this or any previous visit (from the past 24 hour(s)).    Social History   Tobacco Use  Smoking Status Former Smoker  . Packs/day: 2.00  . Years: 15.00  . Pack years: 30.00  . Types: Cigarettes  . Last attempt to quit: 11/26/1996  . Years since quitting: 22.1  Smokeless Tobacco Never Used    Goals Met:  Proper associated with RPD/PD & O2 Sat Independence with exercise equipment Exercise tolerated well No report of cardiac concerns or symptoms Strength training completed today  Goals Unmet:  Not Applicable  Comments: Pt able to follow exercise prescription today without complaint.  Will continue to monitor for progression. Check out 0915.   Dr. Kate Sable is Medical Director for [facility] Cardiac and Pulmonary Rehab.

## 2019-02-02 ENCOUNTER — Encounter (HOSPITAL_COMMUNITY): Payer: BLUE CROSS/BLUE SHIELD

## 2019-02-04 ENCOUNTER — Ambulatory Visit (HOSPITAL_COMMUNITY)
Admission: RE | Admit: 2019-02-04 | Discharge: 2019-02-04 | Disposition: A | Discharge status: Home / Self Care | Payer: BLUE CROSS/BLUE SHIELD | Source: Ambulatory Visit | Attending: Nurse Practitioner | Admitting: Nurse Practitioner

## 2019-02-04 ENCOUNTER — Encounter (HOSPITAL_COMMUNITY)
Admission: RE | Admit: 2019-02-04 | Discharge: 2019-02-04 | Disposition: A | Discharge status: Home / Self Care | Payer: BLUE CROSS/BLUE SHIELD | Source: Ambulatory Visit | Attending: Internal Medicine | Admitting: Internal Medicine

## 2019-02-04 ENCOUNTER — Encounter (HOSPITAL_COMMUNITY): Payer: Self-pay | Admitting: Physician Assistant

## 2019-02-04 ENCOUNTER — Other Ambulatory Visit: Payer: Self-pay

## 2019-02-04 VITALS — BP 114/72 | HR 78 | Ht 71.0 in | Wt 304.0 lb

## 2019-02-04 DIAGNOSIS — Z6841 Body Mass Index (BMI) 40.0 and over, adult: Secondary | ICD-10-CM | POA: Diagnosis not present

## 2019-02-04 DIAGNOSIS — Z9889 Other specified postprocedural states: Secondary | ICD-10-CM | POA: Diagnosis not present

## 2019-02-04 DIAGNOSIS — J449 Chronic obstructive pulmonary disease, unspecified: Secondary | ICD-10-CM | POA: Diagnosis not present

## 2019-02-04 DIAGNOSIS — Z79899 Other long term (current) drug therapy: Secondary | ICD-10-CM | POA: Diagnosis not present

## 2019-02-04 DIAGNOSIS — Z885 Allergy status to narcotic agent status: Secondary | ICD-10-CM | POA: Diagnosis not present

## 2019-02-04 DIAGNOSIS — Z7901 Long term (current) use of anticoagulants: Secondary | ICD-10-CM | POA: Diagnosis not present

## 2019-02-04 DIAGNOSIS — Z952 Presence of prosthetic heart valve: Secondary | ICD-10-CM | POA: Diagnosis not present

## 2019-02-04 DIAGNOSIS — E669 Obesity, unspecified: Secondary | ICD-10-CM | POA: Insufficient documentation

## 2019-02-04 DIAGNOSIS — I110 Hypertensive heart disease with heart failure: Secondary | ICD-10-CM | POA: Insufficient documentation

## 2019-02-04 DIAGNOSIS — Z7951 Long term (current) use of inhaled steroids: Secondary | ICD-10-CM | POA: Diagnosis not present

## 2019-02-04 DIAGNOSIS — I5032 Chronic diastolic (congestive) heart failure: Secondary | ICD-10-CM | POA: Diagnosis not present

## 2019-02-04 DIAGNOSIS — Z955 Presence of coronary angioplasty implant and graft: Secondary | ICD-10-CM | POA: Insufficient documentation

## 2019-02-04 DIAGNOSIS — Z7982 Long term (current) use of aspirin: Secondary | ICD-10-CM | POA: Diagnosis not present

## 2019-02-04 DIAGNOSIS — Z888 Allergy status to other drugs, medicaments and biological substances status: Secondary | ICD-10-CM | POA: Diagnosis not present

## 2019-02-04 DIAGNOSIS — Z87891 Personal history of nicotine dependence: Secondary | ICD-10-CM | POA: Insufficient documentation

## 2019-02-04 DIAGNOSIS — Z8249 Family history of ischemic heart disease and other diseases of the circulatory system: Secondary | ICD-10-CM | POA: Insufficient documentation

## 2019-02-04 DIAGNOSIS — I252 Old myocardial infarction: Secondary | ICD-10-CM | POA: Insufficient documentation

## 2019-02-04 DIAGNOSIS — I2510 Atherosclerotic heart disease of native coronary artery without angina pectoris: Secondary | ICD-10-CM | POA: Insufficient documentation

## 2019-02-04 DIAGNOSIS — I4821 Permanent atrial fibrillation: Secondary | ICD-10-CM | POA: Diagnosis not present

## 2019-02-04 DIAGNOSIS — E785 Hyperlipidemia, unspecified: Secondary | ICD-10-CM | POA: Insufficient documentation

## 2019-02-04 NOTE — Progress Notes (Signed)
Daily Session Note  Patient Details  Name: Jeffrey Larson MRN: [id number] Date of Birth: 06/05/1956 Referring Provider:     CARDIAC REHAB PHASE II ORIENTATION from 01/14/2019 in [place]  Referring Provider  Bensimhon      Encounter Date: 02/04/2019  Check In: Session Check In - 02/04/19 0815      Check-In   Supervising physician immediately available to respond to emergencies  See telemetry face sheet for immediately available MD    Location  [facility]    Staff Present  Benay Pike, Exercise Physiologist;Debra Wynetta Emery, RN, BSN;Other    Medication changes reported      No    Fall or balance concerns reported     No    Warm-up and Cool-down  Performed as group-led instruction    Resistance Training Performed  Yes    VAD Patient?  No    PAD/SET Patient?  No      Pain Assessment   Currently in Pain?  No/denies    Pain Score  0-No pain    Multiple Pain Sites  No       Capillary Blood Glucose: No results found for this or any previous visit (from the past 24 hour(s)).    Social History   Tobacco Use  Smoking Status Former Smoker  . Packs/day: 2.00  . Years: 15.00  . Pack years: 30.00  . Types: Cigarettes  . Last attempt to quit: 11/26/1996  . Years since quitting: 22.2  Smokeless Tobacco Never Used    Goals Met:  Proper associated with RPD/PD & O2 Sat Independence with exercise equipment Exercise tolerated well No report of cardiac concerns or symptoms Strength training completed today  Goals Unmet:  Not Applicable  Comments: Pt able to follow exercise prescription today without complaint.  Will continue to monitor for progression. Check out 0915.   Dr. Kate Sable is Medical Director for [facility] Cardiac and Pulmonary Rehab.

## 2019-02-04 NOTE — Progress Notes (Signed)
Primary Care Physician: Jeffrey Reeve, DO Primary Cardiologist: Dr Bronson Ing Referring Physician: Dr Percival [name] is a 63 y.o. male with a history of permanent atrial fibrillation, mitral stenosis s/p replacement, chronic diastolic CHF, pulmonary HTN, CAD, HLD, and HTN who presents for consultation in the [name].  The patient was initially diagnosed with atrial fibrillation several years ago and has been permanent for years s/p MAZE 06/2018. He appears to be asymptomatic of his arrhythmia. Ever since his valve replacement, he "feels like a new man" with improved energy and exercise tolerance.  Today, he denies symptoms of palpitations, chest pain, shortness of breath, orthopnea, PND, lower extremity edema, dizziness, presyncope, syncope, snoring, daytime somnolence, bleeding, or neurologic sequela. The patient is tolerating medications without difficulties and is otherwise without complaint today.    Atrial Fibrillation Risk Factors:  he does not have symptoms or diagnosis of sleep apnea. he does not have a history of rheumatic fever. he does not have a history of alcohol use. The patient does not have a history of early familial atrial fibrillation or other arrhythmias.  he has a BMI of Body mass index is 42.4 kg/m.Marland Kitchen Filed Weights   02/04/19 1340  Weight: (!) 137.9 kg    Family History  Problem Relation Age of Onset  . Heart disease Mother   . Heart disease Father      Atrial Fibrillation Management history:  Previous antiarrhythmic drugs: none Previous cardioversions: none Previous ablations: MAZE 06/2018 CHADS2VASC score: 2 (HTN, CAD) Anticoagulation history: Warfarin   Past Medical History:  Diagnosis Date  . Chronic combined systolic and diastolic congestive heart failure ([place])   . COPD with chronic bronchitis ([place])   . Coronary artery disease   . Essential hypertension, benign    Ramipril to losartan Sept 2015    . History of pneumonia    RML on CXR 07/20/14  . Hyperlipidemia   . Longstanding persistent atrial fibrillation   . Mitral stenosis with regurgitation   . Myocardial infarction ([place])   . Pulmonary hypertension ([place])   . Renal disorder    history kidney stone  . S/P Maze operation for atrial fibrillation 07/18/2018   Complete bilateral atrial lesion set using bipolar radiofrequency and cryothermy ablation with clipping of LA appendage  . S/P mitral valve replacement with metallic valve 05/28/5008   Sorin Carbomedics Optiform bileaflet mechanical valve, size 33 mm  . S/P TVR (tricuspid valve repair) 07/18/2018   Edwards mc3 ring annuloplasty, size 30 mm   Past Surgical History:  Procedure Laterality Date  . APPENDECTOMY    . CARDIOVASCULAR STRESS TEST  03/2014   Borderline reversible ischemic changes at the apex.  Normal LV contractility/EF 52%.  . CORONARY STENT PLACEMENT  7/12013  . LEFT HEART CATHETERIZATION WITH CORONARY ANGIOGRAM N/A 06/12/2012   Procedure: LEFT HEART CATHETERIZATION WITH CORONARY ANGIOGRAM;  Surgeon: Clent Demark, MD;  Location: [facility] CATH LAB;  Service: Cardiovascular;  Laterality: N/A;  . MAZE N/A 07/18/2018   Procedure: MAZE;  Surgeon: Rexene Alberts, MD;  Location: [place];  Service: Open Heart Surgery;  Laterality: N/A;  . MITRAL VALVE REPLACEMENT N/A 07/18/2018   Procedure: MITRAL VALVE (MV) REPLACEMENT WITH CARBOMEDICS OPTIFORM MITRAL VALVE SIZE 33.;  Surgeon: Rexene Alberts, MD;  Location: [place];  Service: Open Heart Surgery;  Laterality: N/A;  . PERCUTANEOUS CORONARY STENT INTERVENTION (PCI-S) N/A 06/17/2012   Procedure: PERCUTANEOUS CORONARY STENT INTERVENTION (PCI-S);  Surgeon: Clent Demark,  MD;  Location: [place] CATH LAB;  Service: Cardiovascular;  Laterality: N/A;  . RIGHT/LEFT HEART CATH AND CORONARY ANGIOGRAPHY N/A 06/10/2018   Procedure: RIGHT/LEFT HEART CATH AND CORONARY ANGIOGRAPHY;  Surgeon: Dixie Dials, MD;  Location: [place] CV LAB;  Service:  Cardiovascular;  Laterality: N/A;  . TEE WITHOUT CARDIOVERSION N/A 06/10/2018   Procedure: TRANSESOPHAGEAL ECHOCARDIOGRAM (TEE) with bubble study;  Surgeon: Dixie Dials, MD;  Location: [facility] ENDOSCOPY;  Service: Cardiovascular;  Laterality: N/A;  . TEE WITHOUT CARDIOVERSION N/A 07/18/2018   Procedure: TRANSESOPHAGEAL ECHOCARDIOGRAM (TEE);  Surgeon: Rexene Alberts, MD;  Location: [place];  Service: Open Heart Surgery;  Laterality: N/A;  . TRICUSPID VALVE REPLACEMENT N/A 07/18/2018   Procedure: TRICUSPID VALVE REPAIR WITH EDWARDS MC3 TRICUSPID ANNULOPLASTY RING SIZE 30.;  Surgeon: Rexene Alberts, MD;  Location: [place];  Service: Open Heart Surgery;  Laterality: N/A;    Current Outpatient Medications  Medication Sig Dispense Refill  . acetaminophen (TYLENOL) 500 MG tablet Take 1,000 mg by mouth 2 (two) times daily.    . AMBULATORY NON FORMULARY MEDICATION Medication Name: Glucometer, lancets and strip to test every other day. Dx: Diabetes type 2, E11.9. 100 strip and 100 lancets 1 Units PRN  . aspirin 81 MG tablet Take 81 mg by mouth daily.    Marland Kitchen atorvastatin (LIPITOR) 40 MG tablet Take 1 tablet (40 mg total) by mouth every evening. 90 tablet 3  . Fluticasone-Umeclidin-Vilant (TRELEGY ELLIPTA) 100-62.5-25 MCG/INH AEPB Inhale 1 puff into the lungs daily. 1 each 5  . metoprolol succinate (TOPROL-XL) 50 MG 24 hr tablet Take 1 tablet (50 mg total) by mouth daily. Take with or immediately following a meal. 30 tablet 3  . Omega-3 Fatty Acids (FISH OIL) 1200 MG CAPS Take 1,200 mg by mouth daily.    Marland Kitchen oxymetazoline (AFRIN) 0.05 % nasal spray Place 1 spray into both nostrils daily.    . sacubitril-valsartan (ENTRESTO) 24-26 MG Take 1 tablet by mouth 2 (two) times daily. 180 tablet 1  . sildenafil (REVATIO) 20 MG tablet Take 1 tablet (20 mg total) by mouth 3 (three) times daily. 270 tablet 3  . spironolactone (ALDACTONE) 25 MG tablet Take 1 tablet (25 mg total) by mouth daily. 30 tablet 6  . torsemide (DEMADEX)  20 MG tablet Take 2 tablets (40 mg total) by mouth daily. 60 tablet 11  . warfarin (COUMADIN) 4 MG tablet Take 1 tablet (4 mg total) by mouth as directed. 60 tablet 3  . benzonatate (TESSALON) 200 MG capsule Take 1 capsule (200 mg total) by mouth 3 (three) times daily as needed for cough. (Patient not taking: Reported on 01/20/2019) 30 capsule 0  . nitroGLYCERIN (NITROSTAT) 0.4 MG SL tablet Place 1 tablet (0.4 mg total) under the tongue every 5 (five) minutes x 3 doses as needed for chest pain. (Patient not taking: Reported on 02/04/2019) 25 tablet 3   No current facility-administered medications for this encounter.     Allergies  Allergen Reactions  . Ramipril Cough    cough  . Percocet [Oxycodone-Acetaminophen]     In ICU it contributed to hallucinations. Can take by itself    Social History   Socioeconomic History  . Marital status: Married    Spouse name: Not on file  . Number of children: Not on file  . Years of education: Not on file  . Highest education level: Not on file  Occupational History  . Not on file  Social Needs  . Financial resource strain: Not  on file  . Food insecurity:    Worry: Not on file    Inability: Not on file  . Transportation needs:    Medical: Not on file    Non-medical: Not on file  Tobacco Use  . Smoking status: Former Smoker    Packs/day: 2.00    Years: 15.00    Pack years: 30.00    Types: Cigarettes    Last attempt to quit: 11/26/1996    Years since quitting: 22.2  . Smokeless tobacco: Never Used  Substance and Sexual Activity  . Alcohol use: No  . Drug use: No  . Sexual activity: Yes  Lifestyle  . Physical activity:    Days per week: Not on file    Minutes per session: Not on file  . Stress: Not on file  Relationships  . Social connections:    Talks on phone: Not on file    Gets together: Not on file    Attends religious service: Not on file    Active member of club or organization: Not on file    Attends meetings of clubs or  organizations: Not on file    Relationship status: Not on file  . Intimate partner violence:    Fear of current or ex partner: Not on file    Emotionally abused: Not on file    Physically abused: Not on file    Forced sexual activity: Not on file  Other Topics Concern  . Not on file  Social History Narrative  . Not on file     ROS- All systems are reviewed and negative except as per the HPI above.  Physical Exam: Vitals:   02/04/19 1340  BP: 114/72  Pulse: 78  Weight: (!) 137.9 kg  Height: 5\' 11"  (1.803 m)    GEN- The patient is well appearing obese male, alert and oriented x 3 today.   Head- normocephalic, atraumatic Eyes-  Sclera clear, conjunctiva pink Ears- hearing intact Oropharynx- clear Neck- supple  Lungs- Clear to ausculation bilaterally, normal work of breathing Heart- irregular rate and rhythm, no murmurs, rubs or gallops  GI- soft, NT, ND, + BS Extremities- no clubbing, cyanosis, or edema MS- no significant deformity or atrophy Skin- no rash or lesion Psych- euthymic mood, full affect Neuro- strength and sensation are intact  Wt Readings from Last 3 Encounters:  02/04/19 (!) 137.9 kg  01/20/19 (!) 137 kg  01/14/19 134.1 kg    EKG today demonstrates Jeffrey HR 78, inc RBBB, QRS 108, QTc 460  Echo 12/01/18 demonstrated  - Left ventricle: The cavity size was normal. Wall thickness was   increased in a pattern of mild LVH. Systolic function was normal.   The estimated ejection fraction was in the range of 55% to 60%. - Aortic valve: Valve area (VTI): 3.01 cm^2. Valve area (Vmax):   2.46 cm^2. Valve area (Vmean): 2.9 cm^2. - Mitral valve: There is a Sorin Carbomedics Optiform bileaflet 33   mm mechanical valve in the MV position. There was no evidence for   stenosis. There was no significant regurgitation. Mean gradient   (D): 4 mm Hg. Valve area by pressure half-time: 2.06 cm^2. Valve   area by continuity equation (using LVOT flow): 1.59 cm^2. - Left  atrium: The atrium was severely dilated. - Right ventricle: The cavity size was moderately dilated. Systolic   function was moderately reduced. - Technically difficult study. Echoconrast was used to enhance   visualization.  Epic records are reviewed at length today  Assessment and Plan:  1. Permanent atrial fibrillation The patient has permanent atrial fibrillation. He is asymptomatic. Continue Toprol 50 mg for rate control. Continue warfarin for anticoagulation.   This patients CHA2DS2-VASc Score and unadjusted Ischemic Stroke Rate (% per year) is equal to 2.2 % stroke rate/year from a score of 2  Above score calculated as 1 point each if present [CHF, HTN, DM, Vascular=MI/PAD/Aortic Plaque, Age if 65-74, or Male] Above score calculated as 2 points each if present [Age > 75, or Stroke/TIA/TE]   2. Obesity Body mass index is 42.4 kg/m. Lifestyle modification was discussed at length including regular exercise and weight reduction.  3. HTN Stable, no changes today.  4. Mitral stenosis S/p MVR. Function per echo 11/2018.   Follow up in the [facility] in 1 year.  [facility] [address] [place], [place] [address] [phone] 02/04/2019 2:12 PM

## 2019-02-06 ENCOUNTER — Other Ambulatory Visit: Payer: Self-pay

## 2019-02-06 ENCOUNTER — Encounter (HOSPITAL_COMMUNITY)
Admission: RE | Admit: 2019-02-06 | Discharge: 2019-02-06 | Disposition: A | Discharge status: Home / Self Care | Payer: BLUE CROSS/BLUE SHIELD | Source: Ambulatory Visit | Attending: Internal Medicine | Admitting: Internal Medicine

## 2019-02-06 DIAGNOSIS — Z952 Presence of prosthetic heart valve: Secondary | ICD-10-CM | POA: Diagnosis not present

## 2019-02-06 DIAGNOSIS — Z9889 Other specified postprocedural states: Secondary | ICD-10-CM

## 2019-02-06 NOTE — Progress Notes (Signed)
Daily Session Note  Patient Details  Name: Jeffrey L[facility] MRN: [id number] Date of Birth: [birth year] Referring Provider:     [place] from 01/14/2019 in [place]  Referring Provider  [place]      Encounter Date: 02/06/2019  Check In: Session Check In - 02/06/19 0815      Check-In   Supervising physician immediately available to respond to emergencies  See telemetry face sheet for immediately available MD    Location  [facility]    Staff Present  Benay Pike, Exercise Physiologist;Sigourney Portillo Wynetta Emery, RN, BSN;Diane Coad, MS, EP, [facility], Exercise Physiologist    Medication changes reported      No    Fall or balance concerns reported     No    Warm-up and Cool-down  Performed as group-led instruction    Resistance Training Performed  Yes    VAD Patient?  No    PAD/SET Patient?  No      Pain Assessment   Currently in Pain?  No/denies    Pain Score  0-No pain    Multiple Pain Sites  No       Capillary Blood Glucose: No results found for this or any previous visit (from the past 24 hour(s)).    Social History   Tobacco Use  Smoking Status Former Smoker  . Packs/day: 2.00  . Years: 15.00  . Pack years: 30.00  . Types: Cigarettes  . Last attempt to quit: 11/26/1996  . Years since quitting: 22.2  Smokeless Tobacco Never Used    Goals Met:  Independence with exercise equipment Exercise tolerated well No report of cardiac concerns or symptoms Strength training completed today  Goals Unmet:  Not Applicable  Comments: Pt able to follow exercise prescription today without complaint.  Will continue to monitor for progression. Check out 915.   Dr. Kate Sable is Medical Director for [facility] Cardiac and Pulmonary Rehab.

## 2019-02-09 ENCOUNTER — Other Ambulatory Visit: Payer: Self-pay

## 2019-02-09 ENCOUNTER — Encounter (HOSPITAL_COMMUNITY)
Admission: RE | Admit: 2019-02-09 | Discharge: 2019-02-09 | Disposition: A | Discharge status: Home / Self Care | Payer: BLUE CROSS/BLUE SHIELD | Source: Ambulatory Visit | Attending: Internal Medicine | Admitting: Internal Medicine

## 2019-02-09 ENCOUNTER — Ambulatory Visit (INDEPENDENT_AMBULATORY_CARE_PROVIDER_SITE_OTHER): Payer: BLUE CROSS/BLUE SHIELD | Admitting: *Deleted

## 2019-02-09 ENCOUNTER — Encounter

## 2019-02-09 DIAGNOSIS — I4891 Unspecified atrial fibrillation: Secondary | ICD-10-CM

## 2019-02-09 DIAGNOSIS — Z5181 Encounter for therapeutic drug level monitoring: Secondary | ICD-10-CM

## 2019-02-09 DIAGNOSIS — Z952 Presence of prosthetic heart valve: Secondary | ICD-10-CM

## 2019-02-09 DIAGNOSIS — Z954 Presence of other heart-valve replacement: Secondary | ICD-10-CM

## 2019-02-09 DIAGNOSIS — Z9889 Other specified postprocedural states: Secondary | ICD-10-CM | POA: Diagnosis not present

## 2019-02-09 LAB — POCT INR: INR: 2.8 (ref 2.0–3.0)

## 2019-02-09 MED ORDER — WARFARIN SODIUM 4 MG PO TABS: 4.0000 mg | ORAL_TABLET | ORAL | 3 refills | Status: DC

## 2019-02-09 NOTE — Progress Notes (Signed)
Daily Session Note  Patient Details  Name: Jeffrey [facility] MRN: [id number] Date of Birth: 02-Jul-1956 Referring Provider:     [place] from 01/14/2019 in [place]  Referring Provider  Bensimhon      Encounter Date: 02/09/2019  Check In: Session Check In - 02/09/19 0815      Check-In   Supervising physician immediately available to respond to emergencies  See telemetry face sheet for immediately available MD    Location  [facility]    Staff Present  Benay Pike, Exercise Physiologist;Debra Wynetta Emery, RN, BSN;Diane Coad, MS, EP, [facility], Exercise Physiologist;Other    Medication changes reported      No    Fall or balance concerns reported     No    Warm-up and Cool-down  Performed as group-led instruction    Resistance Training Performed  Yes    VAD Patient?  No    PAD/SET Patient?  No      Pain Assessment   Currently in Pain?  No/denies    Pain Score  0-No pain    Multiple Pain Sites  No       Capillary Blood Glucose: No results found for this or any previous visit (from the past 24 hour(s)).    Social History   Tobacco Use  Smoking Status Former Smoker  . Packs/day: 2.00  . Years: 15.00  . Pack years: 30.00  . Types: Cigarettes  . Last attempt to quit: 11/26/1996  . Years since quitting: 22.2  Smokeless Tobacco Never Used    Goals Met:  Proper associated with RPD/PD & O2 Sat Independence with exercise equipment Exercise tolerated well No report of cardiac concerns or symptoms Strength training completed today  Goals Unmet:  Not Applicable  Comments: Pt able to follow exercise prescription today without complaint.  Will continue to monitor for progression. Check out 0915.   Dr. Kate Sable is Medical Director for [facility] Cardiac and Pulmonary Rehab.

## 2019-02-09 NOTE — Patient Instructions (Signed)
Continue coumadin 4mg daily Recheck in 4 weeks Pending 3 dental extractions.  Not scheduled yet. ([facility]) Will need Lovenox bridge 

## 2019-02-11 ENCOUNTER — Encounter (HOSPITAL_COMMUNITY): Payer: BLUE CROSS/BLUE SHIELD

## 2019-02-13 ENCOUNTER — Encounter (HOSPITAL_COMMUNITY): Payer: BLUE CROSS/BLUE SHIELD

## 2019-02-13 NOTE — Progress Notes (Addendum)
Cardiac Individual Treatment Plan  Patient Details  Name: Jeffrey Larson MRN: [id number] Date of Birth: October 28, 1956 Referring Provider:     CARDIAC REHAB [place] from 01/14/2019 in [place]  Referring Provider  [place]      Initial Encounter Date:    CARDIAC REHAB PHASE II ORIENTATION from 01/14/2019 in [place]  Date  01/14/19      Visit Diagnosis: S/P mitral valve replacement  S/P tricuspid valve repair  Patient's Home Medications on Admission:  Current Outpatient Medications:  .  acetaminophen (TYLENOL) 500 MG tablet, Take 1,000 mg by mouth 2 (two) times daily., Disp: , Rfl:  .  AMBULATORY NON FORMULARY MEDICATION, Medication Name: Glucometer, lancets and strip to test every other day. Dx: Diabetes type 2, E11.9. 100 strip and 100 lancets, Disp: 1 Units, Rfl: PRN .  aspirin 81 MG tablet, Take 81 mg by mouth daily., Disp: , Rfl:  .  atorvastatin (LIPITOR) 40 MG tablet, Take 1 tablet (40 mg total) by mouth every evening., Disp: 90 tablet, Rfl: 3 .  benzonatate (TESSALON) 200 MG capsule, Take 1 capsule (200 mg total) by mouth 3 (three) times daily as needed for cough. (Patient not taking: Reported on 01/20/2019), Disp: 30 capsule, Rfl: 0 .  Fluticasone-Umeclidin-Vilant (TRELEGY ELLIPTA) 100-62.5-25 MCG/INH AEPB, Inhale 1 puff into the lungs daily., Disp: 1 each, Rfl: 5 .  metoprolol succinate (TOPROL-XL) 50 MG 24 hr tablet, Take 1 tablet (50 mg total) by mouth daily. Take with or immediately following a meal., Disp: 30 tablet, Rfl: 3 .  nitroGLYCERIN (NITROSTAT) 0.4 MG SL tablet, Place 1 tablet (0.4 mg total) under the tongue every 5 (five) minutes x 3 doses as needed for chest pain. (Patient not taking: Reported on 02/04/2019), Disp: 25 tablet, Rfl: 3 .  Omega-3 Fatty Acids (FISH OIL) 1200 MG CAPS, Take 1,200 mg by mouth daily., Disp: , Rfl:  .  oxymetazoline (AFRIN) 0.05 % nasal spray, Place 1 spray into both nostrils  daily., Disp: , Rfl:  .  sacubitril-valsartan (ENTRESTO) 24-26 MG, Take 1 tablet by mouth 2 (two) times daily., Disp: 180 tablet, Rfl: 1 .  sildenafil (REVATIO) 20 MG tablet, Take 1 tablet (20 mg total) by mouth 3 (three) times daily., Disp: 270 tablet, Rfl: 3 .  spironolactone (ALDACTONE) 25 MG tablet, Take 1 tablet (25 mg total) by mouth daily., Disp: 30 tablet, Rfl: 6 .  torsemide (DEMADEX) 20 MG tablet, Take 2 tablets (40 mg total) by mouth daily., Disp: 60 tablet, Rfl: 11 .  warfarin (COUMADIN) 4 MG tablet, Take 1 tablet (4 mg total) by mouth as directed., Disp: 90 tablet, Rfl: 3  Past Medical History: Past Medical History:  Diagnosis Date  . Chronic combined systolic and diastolic congestive heart failure ([place])   . COPD with chronic bronchitis ([place])   . Coronary artery disease   . Essential hypertension, benign    Ramipril to losartan Sept 2015   . History of pneumonia    RML on CXR 07/20/14  . Hyperlipidemia   . Longstanding persistent atrial fibrillation   . Mitral stenosis with regurgitation   . Myocardial infarction ([place])   . Pulmonary hypertension ([place])   . Renal disorder    history kidney stone  . S/P Maze operation for atrial fibrillation 07/18/2018   Complete bilateral atrial lesion set using bipolar radiofrequency and cryothermy ablation with clipping of LA appendage  . S/P mitral valve replacement with metallic valve 7/42/5956   Sorin  [employer] Optiform bileaflet mechanical valve, size 33 mm  . S/P TVR (tricuspid valve repair) 07/18/2018   [facility]wards mc3 ring annuloplasty, size 30 mm    Tobacco Use: Social History   Tobacco Use  Smoking Status Former Smoker  . Packs/day: 2.00  . Years: 15.00  . Pack years: 30.00  . Types: Cigarettes  . Last attempt to quit: 11/26/1996  . Years since quitting: 22.2  Smokeless Tobacco Never Used    Labs: Recent Review Flowsheet Data    Labs for ITP Cardiac and Pulmonary Rehab Latest Ref Rng & Units 07/21/2018 07/28/2018 07/29/2018  07/30/2018 12/08/2018   Cholestrol <200 mg/dL - - - - 139   LDLCALC mg/dL (calc) - - - - 77   HDL >40 mg/dL - - - - 33(L)   Trlycerides <150 mg/dL - - - - 194(H)   Hemoglobin A1c <5.7 % of total Hgb - - - - 5.6   PHART 7.350 - 7.450 - - - - -   PCO2ART 32.0 - 48.0 mmHg - - - - -   HCO3 20.0 - 28.0 mmol/L - - - - -   TCO2 22 - 32 mmol/L 27 - - - -   ACIDBASEDEF 0.0 - 2.0 mmol/L - - - - -   O2SAT % 68.5 58.5 58.3 59.1 -      Capillary Blood Glucose: Lab Results  Component Value Date   GLUCAP 114 (H) 07/25/2018   GLUCAP 101 (H) 07/25/2018   GLUCAP 102 (H) 07/25/2018   GLUCAP 147 (H) 07/24/2018   GLUCAP 87 07/24/2018     Exercise Target Goals: Exercise Program Goal: Individual exercise prescription set using results from initial 6 min walk test and THRR while considering  patient's activity barriers and safety.   Exercise Prescription Goal: Starting with aerobic activity 30 plus minutes a day, 3 days per week for initial exercise prescription. Provide home exercise prescription and guidelines that participant acknowledges understanding prior to discharge.  Activity Barriers & Risk Stratification: Activity Barriers & Cardiac Risk Stratification - 01/14/19 1348      Activity Barriers & Cardiac Risk Stratification   Activity Barriers  None    Cardiac Risk Stratification  High       6 Minute Walk: 6 Minute Walk    Row Name 01/14/19 1346         6 Minute Walk   Phase  Initial     Distance  1300 feet     Walk Time  6 minutes     # of Rest Breaks  0     MPH  2.46     METS  2.88     RPE  9     Perceived Dyspnea   7     VO2 Peak  9.23     Symptoms  No     Resting HR  78 bpm     Resting BP  112/78     Resting Oxygen Saturation   97 %     Exercise Oxygen Saturation  during 6 min walk  97 %     Max Ex. HR  116 bpm     Max Ex. BP  128/74     2 Minute Post BP  114/76        Oxygen Initial Assessment:   Oxygen Re-Evaluation:   Oxygen Discharge (Final Oxygen  Re-Evaluation):   Initial Exercise Prescription: Initial Exercise Prescription - 01/14/19 1300      Date of Initial Exercise RX and  Referring Provider   Date  01/14/19    Referring Provider  Bensimhon    Expected Discharge Date  04/14/19      Treadmill   MPH  1.4    Grade  0    Minutes  17    METs  2.07      Recumbant Elliptical   Level  1    RPM  40    Watts  35    Minutes  17    METs  2      Prescription Details   Frequency (times per week)  3    Duration  Progress to 30 minutes of continuous aerobic without signs/symptoms of physical distress      Intensity   THRR 40-80% of Max Heartrate  [phone]    Ratings of Perceived Exertion  11-13    Perceived Dyspnea  0-4      Progression   Progression  Continue to progress workloads to maintain intensity without signs/symptoms of physical distress.      Resistance Training   Training Prescription  Yes    Weight  1    Reps  10-15       Perform Capillary Blood Glucose checks as needed.  Exercise Prescription Changes:  Exercise Prescription Changes    Row Name 01/14/19 1300 02/03/19 0700 02/16/19 1000         Response to Exercise   Blood Pressure (Admit)  112/78  110/62  106/70     Blood Pressure (Exercise)  128/74  134/62  108/70     Blood Pressure (Exit)  114/76  104/68  110/62     Heart Rate (Admit)  78 bpm  83 bpm  85 bpm     Heart Rate (Exercise)  116 bpm  118 bpm  115 bpm     Heart Rate (Exit)  99 bpm  116 bpm  117 bpm     Oxygen Saturation (Admit)  97 %  -  -     Oxygen Saturation (Exercise)  97 %  -  -     Oxygen Saturation (Exit)  98 %  -  -     Rating of Perceived Exertion (Exercise)  9  11  11      Perceived Dyspnea (Exercise)  7  -  -     Comments  6 minute walk test   first two weeks of exercose   -     Duration  Progress to 30 minutes of  aerobic without signs/symptoms of physical distress  Continue with 30 min of aerobic exercise without signs/symptoms of physical distress.  Continue with 30  min of aerobic exercise without signs/symptoms of physical distress.     Intensity  THRR New 110-126-142  THRR unchanged  THRR unchanged       Progression   Progression  -  Continue to progress workloads to maintain intensity without signs/symptoms of physical distress.  Continue to progress workloads to maintain intensity without signs/symptoms of physical distress.     Average METs  -  3.66  3.45       Resistance Training   Training Prescription  -  Yes  Yes     Weight  -  2  2     Reps  -  10-15  10-15       Treadmill   MPH  -  1.6  1.9     Grade  -  0  0     Minutes  -  17  17     METs  -  2.22  2.45       Recumbant Elliptical   Level  -  1  1     RPM  -  63  58     Watts  -  90  80     Minutes  -  22  22     METs  -  5.1  4.4       Home Exercise Plan   Plans to continue exercise at  Home (comment)  Home (comment)  Home (comment)     Frequency  Add 2 additional days to program exercise sessions.  Add 2 additional days to program exercise sessions.  Add 2 additional days to program exercise sessions.     Initial Home Exercises Provided  01/14/19  01/14/19  01/14/19        Exercise Comments:  Exercise Comments    Row Name 01/27/19 0800 02/16/19 1010         Exercise Comments  Pt. is new to CR, he has attended 5 sessions. He is walking on the TM and riding the XR. Both machines he has tolerated well and is pushing himself to grow stronger on. We will begin to increase his workloads as tolerated.   Pt. has attended 10 sessions so far. He continues to tolerate the exercise well on both machines and is up to 1.9 MPH on the TM.          Exercise Goals and Review:  Exercise Goals    Row Name 01/14/19 1352             Exercise Goals   Increase Physical Activity  Yes       Intervention  Develop an individualized exercise prescription for aerobic and resistive training based on initial evaluation findings, risk stratification, comorbidities and participant's personal  goals.;Provide advice, education, support and counseling about physical activity/exercise needs.       Expected Outcomes  Short Term: Attend rehab on a regular basis to increase amount of physical activity.;Long Term: Add in home exercise to make exercise part of routine and to increase amount of physical activity.;Long Term: Exercising regularly at least 3-5 days a week.       Increase Strength and Stamina  Yes       Intervention  Provide advice, education, support and counseling about physical activity/exercise needs.;Develop an individualized exercise prescription for aerobic and resistive training based on initial evaluation findings, risk stratification, comorbidities and participant's personal goals.       Expected Outcomes  Short Term: Increase workloads from initial exercise prescription for resistance, speed, and METs.;Short Term: Perform resistance training exercises routinely during rehab and add in resistance training at home;Long Term: Improve cardiorespiratory fitness, muscular endurance and strength as measured by increased METs and functional capacity (6MWT)       Able to understand and use rate of perceived exertion (RPE) scale  Yes       Intervention  Provide education and explanation on how to use RPE scale       Expected Outcomes  Short Term: Able to use RPE daily in rehab to express subjective intensity level;Long Term:  Able to use RPE to guide intensity level when exercising independently       Able to understand and use Dyspnea scale  Yes       Intervention  Provide education and explanation on how to use Dyspnea scale  Expected Outcomes  Short Term: Able to use Dyspnea scale daily in rehab to express subjective sense of shortness of breath during exertion;Long Term: Able to use Dyspnea scale to guide intensity level when exercising independently       Knowledge and understanding of Target Heart Rate Range (THRR)  Yes       Intervention  Provide education and explanation of  THRR including how the numbers were predicted and where they are located for reference       Expected Outcomes  Short Term: Able to state/look up THRR       Able to check pulse independently  Yes       Intervention  Provide education and demonstration on how to check pulse in carotid and radial arteries.;Review the importance of being able to check your own pulse for safety during independent exercise       Expected Outcomes  Short Term: Able to explain why pulse checking is important during independent exercise;Long Term: Able to check pulse independently and accurately       Understanding of Exercise Prescription  Yes       Intervention  Provide education, explanation, and written materials on patient's individual exercise prescription       Expected Outcomes  Short Term: Able to explain program exercise prescription;Long Term: Able to explain home exercise prescription to exercise independently          Exercise Goals Re-Evaluation : Exercise Goals Re-Evaluation    Row Name 01/27/19 0758 02/16/19 1009           Exercise Goal Re-Evaluation   Exercise Goals Review  Increase Physical Activity;Increase Strength and Stamina;Able to understand and use rate of perceived exertion (RPE) scale;Knowledge and understanding of Target Heart Rate Range (THRR);Able to check pulse independently;Understanding of Exercise Prescription  Increase Physical Activity;Increase Strength and Stamina;Able to understand and use rate of perceived exertion (RPE) scale;Knowledge and understanding of Target Heart Rate Range (THRR);Able to check pulse independently;Understanding of Exercise Prescription      Comments  Pt. is new to the program, he has attended 5 sessions so far. He has tolerated the exercise well. We will begin in increase his workloads as tolerated.   Pt. is still fairly new to CR. He has now attended 10 sessions. We have progressed him to 1.9 MPH on the TM and he has tolerated it well.       Expected  Outcomes  lose weight and develop heart healthy habits.   lose weight and develop heart healthy habits.           Discharge Exercise Prescription (Final Exercise Prescription Changes): Exercise Prescription Changes - 02/16/19 1000      Response to Exercise   Blood Pressure (Admit)  106/70    Blood Pressure (Exercise)  108/70    Blood Pressure (Exit)  110/62    Heart Rate (Admit)  85 bpm    Heart Rate (Exercise)  115 bpm    Heart Rate (Exit)  117 bpm    Rating of Perceived Exertion (Exercise)  11    Duration  Continue with 30 min of aerobic exercise without signs/symptoms of physical distress.    Intensity  THRR unchanged      Progression   Progression  Continue to progress workloads to maintain intensity without signs/symptoms of physical distress.    Average METs  3.45      Resistance Training   Training Prescription  Yes    Weight  2  Reps  10-15      Treadmill   MPH  1.9    Grade  0    Minutes  17    METs  2.45      Recumbant Elliptical   Level  1    RPM  58    Watts  80    Minutes  22    METs  4.4      Home Exercise Plan   Plans to continue exercise at  Home (comment)    Frequency  Add 2 additional days to program exercise sessions.    Initial Home Exercises Provided  01/14/19       Nutrition:  Target Goals: Understanding of nutrition guidelines, daily intake of sodium 1500mg , cholesterol 200mg , calories 30% from fat and 7% or less from saturated fats, daily to have 5 or more servings of fruits and vegetables.  Biometrics: Pre Biometrics - 01/14/19 1352      Pre Biometrics   Height  5\' 11"  (1.803 m)    Waist Circumference  55 inches    Hip Circumference  46 inches    Waist to Hip Ratio  1.2 %    Triceps Skinfold  15 mm    % Body Fat  39.1 %    Grip Strength  14.8 kg    Single Leg Stand  60 seconds        Nutrition Therapy Plan and Nutrition Goals: Nutrition Therapy & Goals - 02/16/19 1115      Nutrition Therapy   RD appointment deferred   Yes      Personal Nutrition Goals   Comments  Patient was encouraged to attend RD meeting. Will continue to monitor for progress.       Intervention Plan   Intervention  Nutrition handout(s) given to patient.       Nutrition Assessments: Nutrition Assessments - 01/14/19 1447      MEDFICTS Scores   Pre Score  108       Nutrition Goals Re-Evaluation:   Nutrition Goals Discharge (Final Nutrition Goals Re-Evaluation):   Psychosocial: Target Goals: Acknowledge presence or absence of significant depression and/or stress, maximize coping skills, provide positive support system. Participant is able to verbalize types and ability to use techniques and skills needed for reducing stress and depression.  Initial Review & Psychosocial Screening: Initial Psych Review & Screening - 01/14/19 1445      Initial Review   Current issues with  None Identified      Family Dynamics   Good Support System?  Yes      Barriers   Psychosocial barriers to participate in program  There are no identifiable barriers or psychosocial needs.      Screening Interventions   Interventions  Encouraged to exercise    Expected Outcomes  Short Term goal: Identification and review with participant of any Quality of Life or Depression concerns found by scoring the questionnaire.;Long Term goal: The participant improves quality of Life and PHQ9 Scores as seen by post scores and/or verbalization of changes       Quality of Life Scores: Quality of Life - 01/14/19 1354      Quality of Life   Select  Quality of Life      Quality of Life Scores   Health/Function Pre  29.6 %    Socioeconomic Pre  28.93 %    Psych/Spiritual Pre  28.29 %    Family Pre  28.8 %    GLOBAL Pre  29.07 %  Scores of 19 and below usually indicate a poorer quality of life in these areas.  A difference of  2-3 points is a clinically meaningful difference.  A difference of 2-3 points in the total score of the Quality of Life Index  has been associated with significant improvement in overall quality of life, self-image, physical symptoms, and general health in studies assessing change in quality of life.  PHQ-9: Recent Review Flowsheet Data    Depression screen [facility] 2/9 01/14/2019 08/02/2017   Decreased Interest 0 0   Down, Depressed, Hopeless 0 0   PHQ - 2 Score 0 0   Altered sleeping 0 -   Tired, decreased energy 0 -   Change in appetite 0 -   Feeling bad or failure about yourself  0 -   Trouble concentrating 0 -   Moving slowly or fidgety/restless 0 -   Suicidal thoughts 0 -   PHQ-9 Score 0 -   Difficult doing work/chores Not difficult at all -     Interpretation of Total Score  Total Score Depression Severity:  1-4 = Minimal depression, 5-9 = Mild depression, 10-14 = Moderate depression, 15-19 = Moderately severe depression, 20-27 = Severe depression   Psychosocial Evaluation and Intervention: Psychosocial Evaluation - 01/14/19 1445      Psychosocial Evaluation & Interventions   Interventions  Encouraged to exercise with the program and follow exercise prescription    Continue Psychosocial Services   No Follow up required       Psychosocial Re-Evaluation: Psychosocial Re-Evaluation    Row Name 01/28/19 1509 02/16/19 1119           Psychosocial Re-Evaluation   Current issues with  None Identified  None Identified      Comments  Patient's initial QOL score was 29.07 and his PHQ-9 score was 0 with no psychosocial issues identified.   Patient's initial QOL score was 29.07 and his PHQ-9 score was 0 with no psychosocial issues identified.       Expected Outcomes  Patient will have no psychosocial issues identified at discharge.   Patient will have no psychosocial issues identified at discharge.       Interventions  Stress management education;Encouraged to attend Cardiac Rehabilitation for the exercise;Relaxation education  Stress management education;Encouraged to attend Cardiac Rehabilitation for the  exercise;Relaxation education      Continue Psychosocial Services   No Follow up required  No Follow up required         Psychosocial Discharge (Final Psychosocial Re-Evaluation): Psychosocial Re-Evaluation - 02/16/19 1119      Psychosocial Re-Evaluation   Current issues with  None Identified    Comments  Patient's initial QOL score was 29.07 and his PHQ-9 score was 0 with no psychosocial issues identified.     Expected Outcomes  Patient will have no psychosocial issues identified at discharge.     Interventions  Stress management education;Encouraged to attend Cardiac Rehabilitation for the exercise;Relaxation education    Continue Psychosocial Services   No Follow up required       Vocational Rehabilitation: Provide vocational rehab assistance to qualifying candidates.   Vocational Rehab Evaluation & Intervention: Vocational Rehab - 01/14/19 1448      Initial Vocational Rehab Evaluation & Intervention   Assessment shows need for Vocational Rehabilitation  No       Education: Education Goals: Education classes will be provided on a weekly basis, covering required topics. Participant will state understanding/return demonstration of topics presented.  Learning Barriers/Preferences: Learning Barriers/Preferences -  01/14/19 1447      Learning Barriers/Preferences   Learning Barriers  None    Learning Preferences  Audio;Group Instruction;Skilled Demonstration;Individual Instruction       Education Topics: Hypertension, Hypertension Reduction -Define heart disease and high blood pressure. Discus how high blood pressure affects the body and ways to reduce high blood pressure.   Exercise and Your Heart -Discuss why it is important to exercise, the FITT principles of exercise, normal and abnormal responses to exercise, and how to exercise safely.   Angina -Discuss definition of angina, causes of angina, treatment of angina, and how to decrease risk of having  angina.   Cardiac Medications -Review what the following cardiac medications are used for, how they affect the body, and side effects that may occur when taking the medications.  Medications include Aspirin, Beta blockers, calcium channel blockers, ACE Inhibitors, angiotensin receptor blockers, diuretics, digoxin, and antihyperlipidemics.   CARDIAC REHAB PHASE II EXERCISE from 02/04/2019 in [place]  Date  01/21/19  Educator  Wynetta Emery  Instruction Review Code  2- Demonstrated Understanding      Congestive Heart Failure -Discuss the definition of CHF, how to live with CHF, the signs and symptoms of CHF, and how keep track of weight and sodium intake.   CARDIAC REHAB PHASE II EXERCISE from 02/04/2019 in [place]  Date  01/28/19  Educator  Orlinda Blalock  Instruction Review Code  2- Demonstrated Understanding      Heart Disease and Intimacy -Discus the effect sexual activity has on the heart, how changes occur during intimacy as we age, and safety during sexual activity.   CARDIAC REHAB PHASE II EXERCISE from 02/04/2019 in [place]  Date  02/04/19  Educator  Wynetta Emery  Instruction Review Code  2- Demonstrated Understanding      Smoking Cessation / COPD -Discuss different methods to quit smoking, the health benefits of quitting smoking, and the definition of COPD.   Nutrition I: Fats -Discuss the types of cholesterol, what cholesterol does to the heart, and how cholesterol levels can be controlled.   Nutrition II: Labels -Discuss the different components of food labels and how to read food label   Heart Parts/Heart Disease and PAD -Discuss the anatomy of the heart, the pathway of blood circulation through the heart, and these are affected by heart disease.   Stress I: Signs and Symptoms -Discuss the causes of stress, how stress may lead to anxiety and depression, and ways to limit stress.   Stress II:  Relaxation -Discuss different types of relaxation techniques to limit stress.   Warning Signs of Stroke / TIA -Discuss definition of a stroke, what the signs and symptoms are of a stroke, and how to identify when someone is having stroke.   Knowledge Questionnaire Score: Knowledge Questionnaire Score - 01/14/19 1447      Knowledge Questionnaire Score   Pre Score  23/24       Core Components/Risk Factors/Patient Goals at Admission: Personal Goals and Risk Factors at Admission - 01/14/19 1448      Core Components/Risk Factors/Patient Goals on Admission    Weight Management  Yes    Admit Weight  295 lb 9.6 oz (134.1 kg)    Goal Weight: Short Term  285 lb 9.6 oz (129.5 kg)    Goal Weight: Long Term  275 lb 9.6 oz (125 kg)    Expected Outcomes  Short Term: Continue to assess and modify interventions until short term weight is achieved;Long  Term: Adherence to nutrition and physical activity/exercise program aimed toward attainment of established weight goal    Personal Goal Other  Yes    Personal Goal  Lose 50lbs. and keep it off, gain heart health    Intervention  Attend program 3 x week and supplement with at home exercise 2 x week.     Expected Outcomes  Reach personal goals       Core Components/Risk Factors/Patient Goals Review:  Goals and Risk Factor Review    Row Name 01/28/19 1507 02/16/19 1116           Core Components/Risk Factors/Patient Goals Review   Personal Goals Review  Weight Management/Obesity Lose 50 lbs; get heart healthy.   Weight Management/Obesity Lose 50 lbs long term. Get heart healthy; keep weight off.       Review  Patient has completed 6 sessions gaining 2 lbs since his orientation visit. He says he has not attended enough to notice any progress. Will continue to monitor for progress.   Patient has completed 10 sessions gaining 7 lbs since last 30 day review. He admits to not taking his diuretic as prescribed especially on weekends due to side effects of  frequent urination. He was encouraged to comply with all medications prescribed. He is doing well in the program with progression. He says he does feel stronger and feels the program is helping him meet his goals. Outpatient cardiac rehab services have been suspended due to the COVID-19 restrictions. Will continue to montior.       Expected Outcomes  Patient will continue to attend sessions and complete the program meeting his personal goals.   Patient will continue to attend sessions and complete the program meeting his personal goals.          Core Components/Risk Factors/Patient Goals at Discharge (Final Review):  Goals and Risk Factor Review - 02/16/19 1116      Core Components/Risk Factors/Patient Goals Review   Personal Goals Review  Weight Management/Obesity   Lose 50 lbs long term. Get heart healthy; keep weight off.    Review  Patient has completed 10 sessions gaining 7 lbs since last 30 day review. He admits to not taking his diuretic as prescribed especially on weekends due to side effects of frequent urination. He was encouraged to comply with all medications prescribed. He is doing well in the program with progression. He says he does feel stronger and feels the program is helping him meet his goals. Outpatient cardiac rehab services have been suspended due to the COVID-19 restrictions. Will continue to montior.     Expected Outcomes  Patient will continue to attend sessions and complete the program meeting his personal goals.        ITP Comments: ITP Comments    Row Name 02/13/19 1311           ITP Comments  Cardiac Rehab services have been suspended due to the COVID-19 restrictions starting 02/09/2019. Patient will restart the progam when restrictions have been lifted.          Comments: ITP REVIEW Patient doing well in the program. Cardiac Rehab services have been suspended due to the COVID-19 restrictions starting 02/09/2019. Patient will restart the progam when  restrictions have been lifted. Will continue to monitor.

## 2019-02-16 ENCOUNTER — Encounter (HOSPITAL_COMMUNITY): Payer: BLUE CROSS/BLUE SHIELD

## 2019-02-18 ENCOUNTER — Encounter (HOSPITAL_COMMUNITY): Payer: BLUE CROSS/BLUE SHIELD

## 2019-02-20 ENCOUNTER — Encounter (HOSPITAL_COMMUNITY): Payer: BLUE CROSS/BLUE SHIELD

## 2019-02-23 ENCOUNTER — Encounter (HOSPITAL_COMMUNITY): Payer: BLUE CROSS/BLUE SHIELD

## 2019-02-25 ENCOUNTER — Encounter (HOSPITAL_COMMUNITY): Payer: BLUE CROSS/BLUE SHIELD

## 2019-02-27 ENCOUNTER — Encounter (HOSPITAL_COMMUNITY): Payer: BLUE CROSS/BLUE SHIELD

## 2019-03-02 ENCOUNTER — Encounter (HOSPITAL_COMMUNITY): Payer: BLUE CROSS/BLUE SHIELD

## 2019-03-04 ENCOUNTER — Encounter (HOSPITAL_COMMUNITY): Payer: BLUE CROSS/BLUE SHIELD

## 2019-03-06 ENCOUNTER — Other Ambulatory Visit: Payer: Self-pay

## 2019-03-06 ENCOUNTER — Encounter (HOSPITAL_COMMUNITY): Payer: BLUE CROSS/BLUE SHIELD

## 2019-03-06 ENCOUNTER — Telehealth: Payer: Self-pay | Admitting: *Deleted

## 2019-03-06 MED ORDER — SACUBITRIL-VALSARTAN 24-26 MG PO TABS: 1.0000 | ORAL_TABLET | Freq: Two times a day (BID) | ORAL | 1 refills | Status: DC

## 2019-03-06 NOTE — Telephone Encounter (Signed)
° °  COVID-19 Pre-Screening Questions: ° °• Do you currently have a fever?NO ° ° °• Have you recently travelled on a cruise, internationally, or to [place], [place], [place], [place], [place], or [place], [place] ([employer]) ? NO °•  °• Have you been in contact with someone that is currently pending confirmation of Covid19 testing or has been confirmed to have the Covid19 virus?  NO °•  °Are you currently experiencing fatigue or cough? NO ° ° °   ° ° ° ° °

## 2019-03-09 ENCOUNTER — Ambulatory Visit (INDEPENDENT_AMBULATORY_CARE_PROVIDER_SITE_OTHER): Payer: BLUE CROSS/BLUE SHIELD | Admitting: *Deleted

## 2019-03-09 ENCOUNTER — Other Ambulatory Visit: Payer: Self-pay

## 2019-03-09 ENCOUNTER — Telehealth (HOSPITAL_COMMUNITY): Payer: Self-pay

## 2019-03-09 ENCOUNTER — Encounter (HOSPITAL_COMMUNITY): Payer: BLUE CROSS/BLUE SHIELD

## 2019-03-09 DIAGNOSIS — Z5181 Encounter for therapeutic drug level monitoring: Secondary | ICD-10-CM | POA: Diagnosis not present

## 2019-03-09 DIAGNOSIS — Z954 Presence of other heart-valve replacement: Secondary | ICD-10-CM | POA: Diagnosis not present

## 2019-03-09 DIAGNOSIS — I4891 Unspecified atrial fibrillation: Secondary | ICD-10-CM | POA: Diagnosis not present

## 2019-03-09 LAB — POCT INR: INR: 2.3 (ref 2.0–3.0)

## 2019-03-09 NOTE — Patient Instructions (Signed)
Take 6mg  tonight then resume 4mg  daily Recheck in 4 weeks Pending 3 dental extractions.  Not scheduled yet. ([place]) Will need Lovenox bridge

## 2019-03-09 NOTE — Telephone Encounter (Signed)
Called patient to verify his e-mail address and to check on his home exercise progress since our office closure due to COVID-19. Patient says he is doing well. He is trying to walk when he can and is staying active around the house. He has a treadmill but is not using it for exercise at this time. He is looking forward to returning to the program. Will follow-up soon.

## 2019-03-11 ENCOUNTER — Encounter (HOSPITAL_COMMUNITY): Payer: BLUE CROSS/BLUE SHIELD

## 2019-03-12 ENCOUNTER — Other Ambulatory Visit: Payer: Self-pay | Admitting: Osteopathic Medicine

## 2019-03-13 ENCOUNTER — Encounter (HOSPITAL_COMMUNITY): Payer: BLUE CROSS/BLUE SHIELD

## 2019-03-16 ENCOUNTER — Encounter (HOSPITAL_COMMUNITY): Payer: BLUE CROSS/BLUE SHIELD

## 2019-03-17 NOTE — Telephone Encounter (Signed)
Called patient to see if he would be interested in participating in virtual CR until we reopen. He said he would be interested and thinks this would be great for him. Will follow up soon.

## 2019-03-18 ENCOUNTER — Encounter (HOSPITAL_COMMUNITY): Payer: BLUE CROSS/BLUE SHIELD

## 2019-03-20 ENCOUNTER — Encounter (HOSPITAL_COMMUNITY): Payer: BLUE CROSS/BLUE SHIELD

## 2019-03-23 ENCOUNTER — Encounter (HOSPITAL_COMMUNITY): Payer: BLUE CROSS/BLUE SHIELD

## 2019-03-25 ENCOUNTER — Encounter (HOSPITAL_COMMUNITY): Payer: BLUE CROSS/BLUE SHIELD

## 2019-03-27 ENCOUNTER — Encounter (HOSPITAL_COMMUNITY): Payer: BLUE CROSS/BLUE SHIELD

## 2019-03-30 ENCOUNTER — Encounter (HOSPITAL_COMMUNITY): Payer: BLUE CROSS/BLUE SHIELD

## 2019-03-30 IMAGING — DX DG CHEST 2V
2 series · 2 of 2 positions shown · non-contrast
Comparison: Chest x-ray of July 29, 2018

CLINICAL DATA: Status post mitral valve replacement and Maze
procedure on July 18, 2018. Persistent nonproductive cough and
shortness of breath. History of COPD and coronary artery disease.

EXAM:
CHEST - 2 VIEW

[dg chest 2 view (1 of 2)]
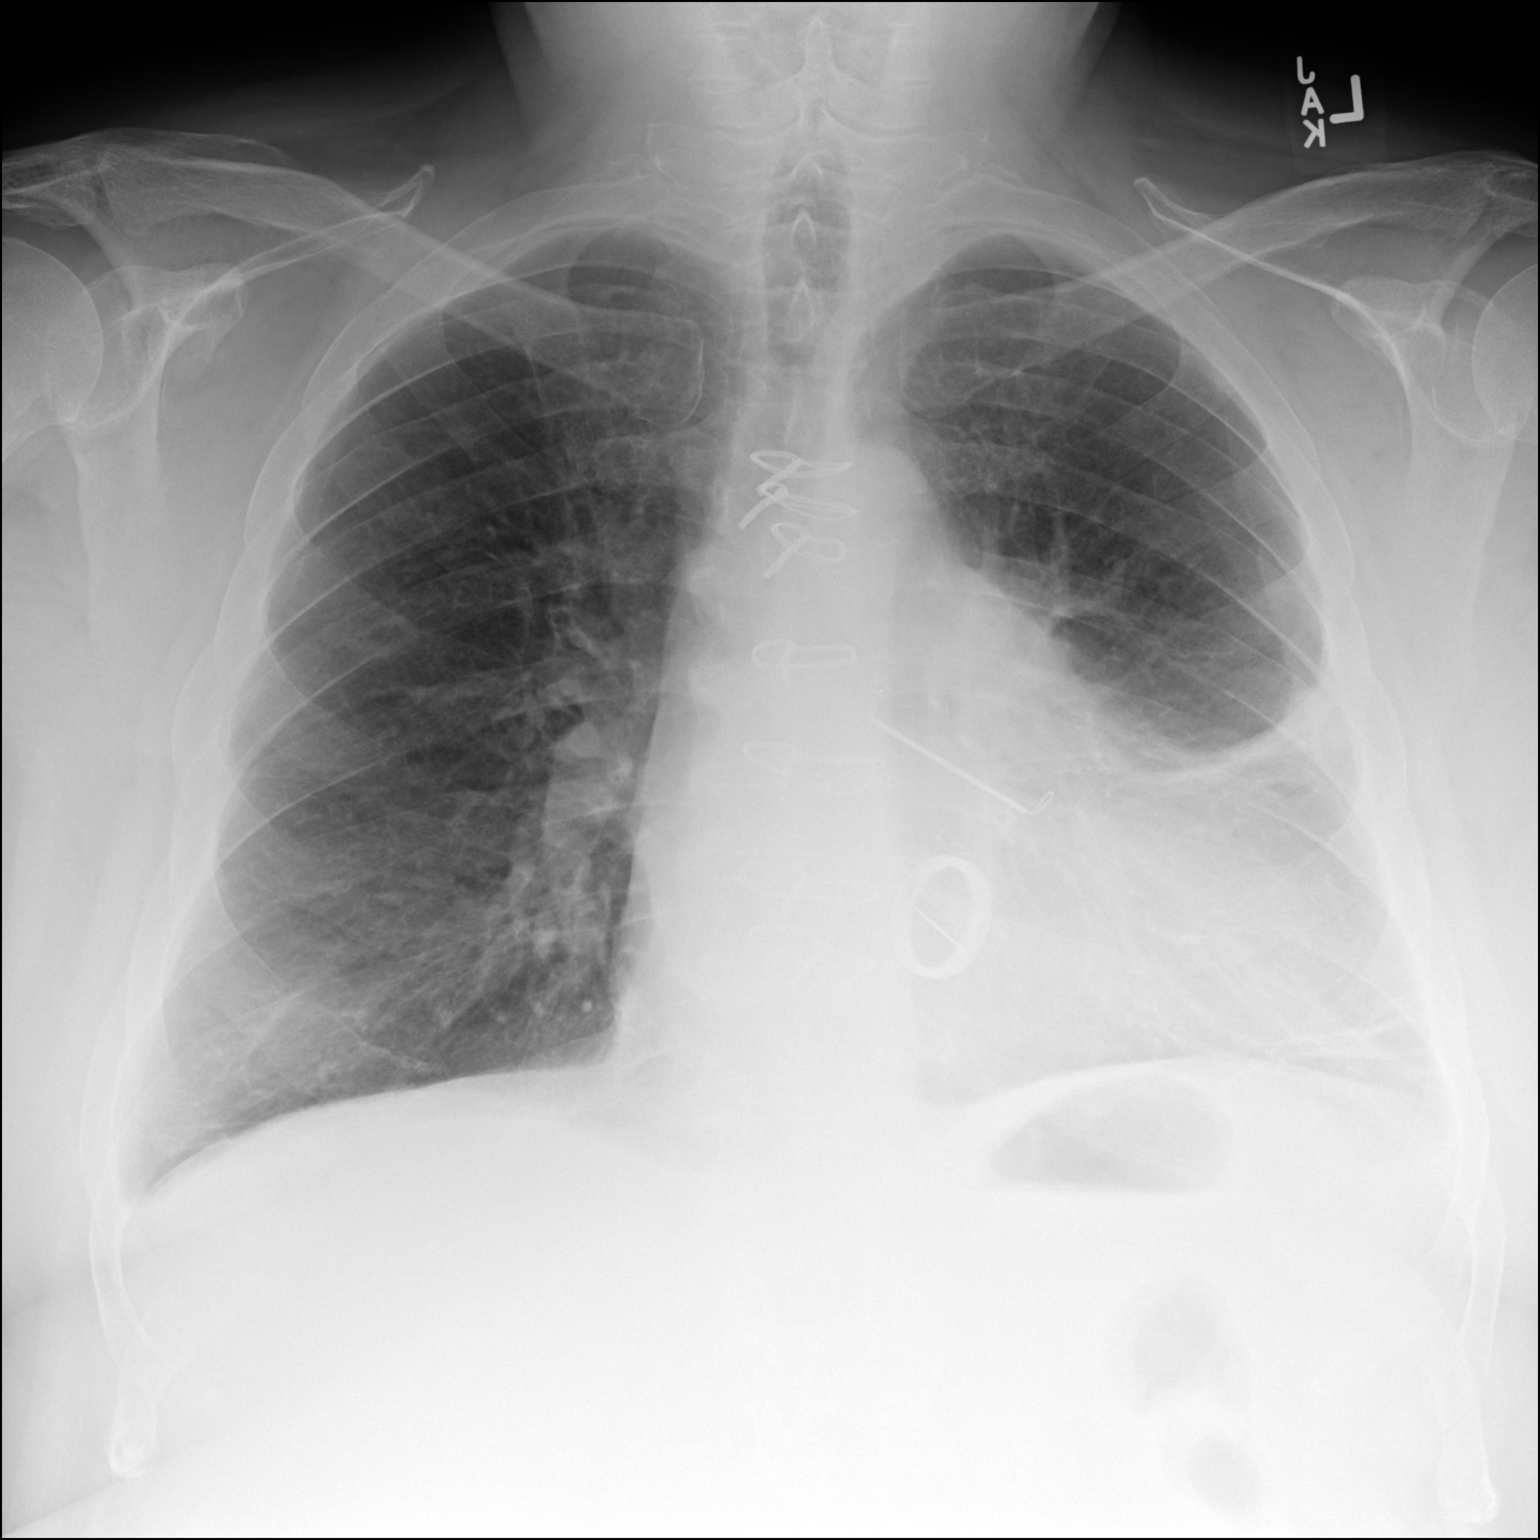

[dg chest 2 view (2 of 2)]
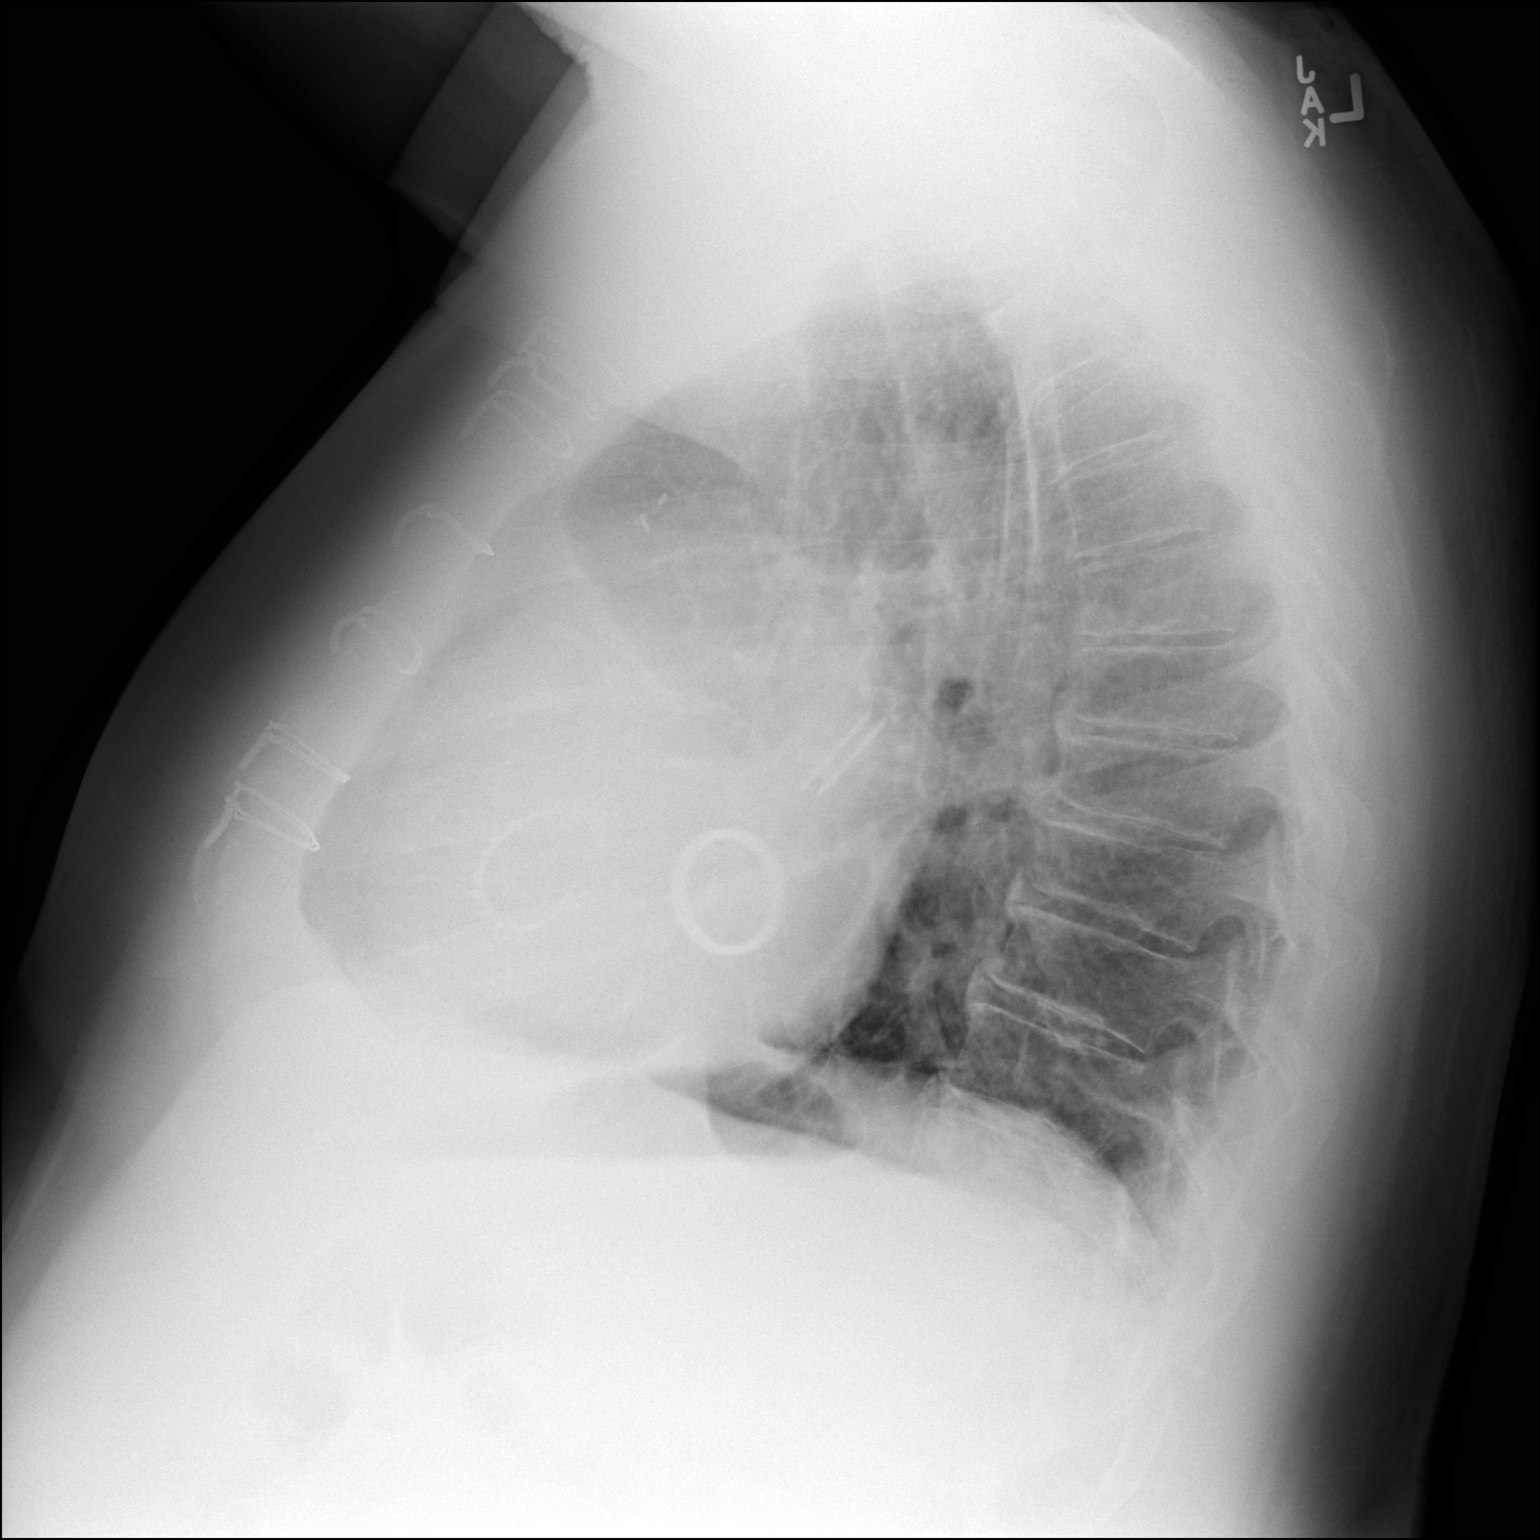

[2 of 2 positions shown; findings below may reference images not displayed]

FINDINGS: The right lung is well-expanded and clear. On the left there is
persistent patchy density in the mid and lower lung. The
interstitial markings of both lungs have improved. The cardiac
silhouette remains mildly enlarged. The pulmonary vascularity is
less engorged and more distinct. The prosthetic mitral and tricuspid
valve rings as well as the left atrial appendage clip appear stable
in position. The small pleural effusions have resolved. The sternal
wires are intact. The retrosternal soft tissues are normal. The bony
thorax exhibits no acute abnormality.
IMPRESSION: Interval resolution of pulmonary interstitial edema and bilateral
pleural effusions. Persistent subsegmental atelectasis or scarring
in the left mid and lower lung.

## 2019-04-01 ENCOUNTER — Encounter (HOSPITAL_COMMUNITY): Payer: BLUE CROSS/BLUE SHIELD

## 2019-04-01 ENCOUNTER — Telehealth: Payer: Self-pay | Admitting: *Deleted

## 2019-04-01 NOTE — Telephone Encounter (Signed)
Per Mr.Glomb,he will have his wife call the office back.

## 2019-04-02 ENCOUNTER — Telehealth (HOSPITAL_COMMUNITY): Payer: Self-pay

## 2019-04-02 NOTE — Telephone Encounter (Signed)
Called patient to check in on him. Left voice mail informing him we are still closed but would be starting our virtual Cardiac rehab program next week. Will follow-up next week.

## 2019-04-03 ENCOUNTER — Encounter (HOSPITAL_COMMUNITY): Payer: BLUE CROSS/BLUE SHIELD

## 2019-04-06 ENCOUNTER — Encounter (HOSPITAL_COMMUNITY): Payer: BLUE CROSS/BLUE SHIELD

## 2019-04-06 ENCOUNTER — Other Ambulatory Visit: Payer: Self-pay

## 2019-04-06 ENCOUNTER — Ambulatory Visit (INDEPENDENT_AMBULATORY_CARE_PROVIDER_SITE_OTHER): Payer: BLUE CROSS/BLUE SHIELD | Admitting: *Deleted

## 2019-04-06 DIAGNOSIS — Z954 Presence of other heart-valve replacement: Secondary | ICD-10-CM

## 2019-04-06 DIAGNOSIS — Z5181 Encounter for therapeutic drug level monitoring: Secondary | ICD-10-CM

## 2019-04-06 DIAGNOSIS — I4891 Unspecified atrial fibrillation: Secondary | ICD-10-CM

## 2019-04-06 LAB — POCT INR: INR: 2.4 (ref 2.0–3.0)

## 2019-04-06 NOTE — Patient Instructions (Signed)
Continue coumadin 4mg  daily Recheck in 4 weeks Pending 3 dental extractions.  Not scheduled yet. ([place]) Will need Lovenox bridge

## 2019-04-08 ENCOUNTER — Encounter (HOSPITAL_COMMUNITY): Payer: BLUE CROSS/BLUE SHIELD

## 2019-04-10 ENCOUNTER — Encounter (HOSPITAL_COMMUNITY): Payer: BLUE CROSS/BLUE SHIELD

## 2019-04-23 ENCOUNTER — Ambulatory Visit (HOSPITAL_COMMUNITY): Payer: BLUE CROSS/BLUE SHIELD

## 2019-04-27 ENCOUNTER — Ambulatory Visit (INDEPENDENT_AMBULATORY_CARE_PROVIDER_SITE_OTHER): Payer: BLUE CROSS/BLUE SHIELD | Admitting: Family Medicine

## 2019-04-27 ENCOUNTER — Encounter: Payer: Self-pay | Admitting: Family Medicine

## 2019-04-27 VITALS — BP 148/76 | HR 114 | Temp 98.1°F | Wt 312.0 lb

## 2019-04-27 DIAGNOSIS — T792XXA Traumatic secondary and recurrent hemorrhage and seroma, initial encounter: Secondary | ICD-10-CM | POA: Diagnosis not present

## 2019-04-27 MED ORDER — DOXYCYCLINE HYCLATE 100 MG PO TABS: 100.0000 mg | ORAL_TABLET | Freq: Two times a day (BID) | ORAL | 0 refills | Status: DC

## 2019-04-27 NOTE — Patient Instructions (Signed)
Thank you for coming in today. Take the doxycycline for 1 week.  Continue current medicines.  If not better we will recheck your leg and also recheck warfarin level.  Keep me updated.    Seroma A seroma is a collection of fluid on the body that looks like swelling or a mass. Seromas form where tissue has been injured or cut. Seromas vary in size. Some are small and painless. Others may become large and cause pain or discomfort. Many seromas go away on their own as the fluid is naturally absorbed by the body, and some seromas need to be drained. What are the causes? Seromas form as the result of damage to tissue or the removal of tissue. This tissue damage may occur during surgery or because of an injury or trauma. When tissue is disrupted or removed, empty space is created. The body's natural defense system (immune system) causes fluid to enter the empty space and form a seroma. What are the signs or symptoms? Symptoms of this condition include:  Swelling at the site of a surgical cut (incision) or an injury.  Drainage of clear fluid at the surgery or injury site.  Discomfort or pain. How is this diagnosed? This condition is diagnosed based on your symptoms, your medical history, and a physical exam. During the exam, your health care provider will press on the seroma. You may also have tests, including:  Blood tests.  Imaging tests, such as an ultrasound or CT scan. How is this treated? Some seromas go away (resolve) on their own. Your health care provider may monitor you to make sure the seroma does not cause any complications. If your seroma does not resolve on its own, treatment may include:  Using a needle to drain the fluid from the seroma (needle aspiration).  Inserting a flexible tube (catheter) to drain the fluid.  Applying a bandage (dressing), such as an elastic bandage or binder.  Antibiotic medicines, if the seroma becomes infected. In rare cases, surgery may be done to  remove the seroma and repair the area. Follow these instructions at home:   If you were prescribed an antibiotic medicine, take it as told by your health care provider. Do not stop taking the antibiotic even if you start to feel better.  Return to your normal activities as told by your health care provider. Ask your health care provider what activities are safe for you.  Take over-the-counter and prescription medicines only as told by your health care provider.  Check your seroma every day for signs of infection. Check for: ? Redness or pain. ? Fluid or pus. ? More swelling. ? Warmth.  Keep all follow-up visits as told by your health care provider. This is important. Contact a health care provider if:  You have a fever.  You have redness or pain at the site of the seroma.  You have fluid or pus coming from the seroma.  Your seroma is more swollen or is getting bigger.  Your seroma is warm to the touch. This information is not intended to replace advice given to you by your health care provider. Make sure you discuss any questions you have with your health care provider. Document Released: 03/09/2013 Document Revised: 08/24/2016 Document Reviewed: 08/24/2016 Elsevier Interactive Patient Education  2019 [employer]    Cellulitis, Adult  Cellulitis is a skin infection. The infected area is usually warm, red, swollen, and tender. This condition occurs most often in the arms and lower legs. The infection can  travel to the muscles, blood, and underlying tissue and become serious. It is very important to get treated for this condition. What are the causes? Cellulitis is caused by bacteria. The bacteria enter through a break in the skin, such as a cut, burn, insect bite, open sore, or crack. What increases the risk? This condition is more likely to occur in people who:  Have a weak body defense system (immune system).  Have open wounds on the skin, such as cuts, burns, bites,  and scrapes. Bacteria can enter the body through these open wounds.  Are older than 63 years of age.  Have diabetes.  Have a type of long-lasting (chronic) liver disease (cirrhosis) or kidney disease.  Are obese.  Have a skin condition such as: ? Itchy rash (eczema). ? Slow movement of blood in the veins (venous stasis). ? Fluid buildup below the skin (edema).  Have had radiation therapy.  Use IV drugs. What are the signs or symptoms? Symptoms of this condition include:  Redness, streaking, or spotting on the skin.  Swollen area of the skin.  Tenderness or pain when an area of the skin is touched.  Warm skin.  A fever.  Chills.  Blisters. How is this diagnosed? This condition is diagnosed based on a medical history and physical exam. You may also have tests, including:  Blood tests.  Imaging tests. How is this treated? Treatment for this condition may include:  Medicines, such as antibiotic medicines or medicines to treat allergies (antihistamines).  Supportive care, such as rest and application of cold or warm cloths (compresses) to the skin.  Hospital care, if the condition is severe. The infection usually starts to get better within 1-2 days of treatment. Follow these instructions at home:  Medicines  Take over-the-counter and prescription medicines only as told by your health care provider.  If you were prescribed an antibiotic medicine, take it as told by your health care provider. Do not stop taking the antibiotic even if you start to feel better. General instructions  Drink enough fluid to keep your urine pale yellow.  Do not touch or rub the infected area.  Raise (elevate) the infected area above the level of your heart while you are sitting or lying down.  Apply warm or cold compresses to the affected area as told by your health care provider.  Keep all follow-up visits as told by your health care provider. This is important. These visits let  your health care provider make sure a more serious infection is not developing. Contact a health care provider if:  You have a fever.  Your symptoms do not begin to improve within 1-2 days of starting treatment.  Your bone or joint underneath the infected area becomes painful after the skin has healed.  Your infection returns in the same area or another area.  You notice a swollen bump in the infected area.  You develop new symptoms.  You have a general ill feeling (malaise) with muscle aches and pains. Get help right away if:  Your symptoms get worse.  You feel very sleepy.  You develop vomiting or diarrhea that persists.  You notice red streaks coming from the infected area.  Your red area gets larger or turns dark in color. These symptoms may represent a serious problem that is an emergency. Do not wait to see if the symptoms will go away. Get medical help right away. Call your local emergency services (911 in the [place] Do not drive yourself to  the hospital. Summary  Cellulitis is a skin infection. This condition occurs most often in the arms and lower legs.  Treatment for this condition may include medicines, such as antibiotic medicines or antihistamines.  Take over-the-counter and prescription medicines only as told by your health care provider. If you were prescribed an antibiotic medicine, do not stop taking the antibiotic even if you start to feel better.  Contact a health care provider if your symptoms do not begin to improve within 1-2 days of starting treatment or your symptoms get worse.  Keep all follow-up visits as told by your health care provider. This is important. These visits let your health care provider make sure that a more serious infection is not developing. This information is not intended to replace advice given to you by your health care provider. Make sure you discuss any questions you have with your health care provider. Document Released:  08/22/2005 Document Revised: 04/03/2018 Document Reviewed: 04/03/2018 Elsevier Interactive Patient Education  2019 [employer].

## 2019-04-27 NOTE — Progress Notes (Signed)
Jeffrey Larson is a 63 y.o. male who presents to [facility] today for left leg pain and swelling.  Patient dropped a piece of firewood on his left shin about a week ago.  He notes continued pain and swelling at the anterior aspect of the proximal tibia.  He denies any fevers chills nausea vomiting or diarrhea.  Has not had much treatment yet.  He notes that it is mildly painful.    ROS:  As above  Exam:  BP (!) 148/76   Pulse (!) 114   Temp 98.1 F (36.7 C) (Oral)   Wt (!) 312 lb (141.5 kg)   BMI 43.52 kg/m  Wt Readings from Last 5 Encounters:  04/27/19 (!) 312 lb (141.5 kg)  02/04/19 (!) 304 lb (137.9 kg)  01/20/19 (!) 302 lb (137 kg)  01/14/19 295 lb 9.6 oz (134.1 kg)  12/08/18 297 lb 1.9 oz (134.8 kg)   General: Well Developed, well nourished, and in no acute distress.  Neuro/Psych: Alert and oriented x3, extra-ocular muscles intact, able to move all 4 extremities, sensation grossly intact. Skin: Warm and dry, no rashes noted.  Respiratory: Not using accessory muscles, speaking in full sentences, trachea midline.  Cardiovascular: Pulses palpable, no extremity edema. Abdomen: Does not appear distended. MSK: Left proximal anterior tibia erythematous with slight swelling.  Small abrasion present skin anterior tibia.  Mildly tender to touch.    Lab and Radiology Results Limited musculoskeletal ultrasound of swelling overlying the proximal anterior tibia reveals hypoechoic fluid tracking superficial to the bone consistent appearance with seroma.  Normal bony structures otherwise.  Normal patellar tendon structure. Impression: Seroma    Assessment and Plan: 63 y.o. male with left anterior proximal tibia swelling and pain following trauma about 1 week ago.  Very likely seroma.  I am concerned for possibility of secondary infection.  Patient has an artificial heart valve and cannot tolerate uncontrolled infections.  We will go  ahead and treat for possible cellulitis with doxycycline.  Recheck if not improving.  Will treat seroma with compression.   PDMP not reviewed this encounter. No orders of the defined types were placed in this encounter.  Meds ordered this encounter  Medications  . doxycycline (VIBRA-TABS) 100 MG tablet    Sig: Take 1 tablet (100 mg total) by mouth 2 (two) times daily.    Dispense:  14 tablet    Refill:  0    Historical information moved to improve visibility of documentation.  Past Medical History:  Diagnosis Date  . Chronic combined systolic and diastolic congestive heart failure ([place])   . COPD with chronic bronchitis ([place])   . Coronary artery disease   . Essential hypertension, benign    Ramipril to losartan Sept 2015   . History of pneumonia    RML on CXR 07/20/14  . Hyperlipidemia   . Longstanding persistent atrial fibrillation   . Mitral stenosis with regurgitation   . Myocardial infarction ([place])   . Pulmonary hypertension ([place])   . Renal disorder    history kidney stone  . S/P Maze operation for atrial fibrillation 07/18/2018   Complete bilateral atrial lesion set using bipolar radiofrequency and cryothermy ablation with clipping of LA appendage  . S/P mitral valve replacement with metallic valve 9/60/4540   Sorin Carbomedics Optiform bileaflet mechanical valve, size 33 mm  . S/P TVR (tricuspid valve repair) 07/18/2018   Edwards mc3 ring annuloplasty, size 30 mm   Past Surgical History:  Procedure Laterality Date  . APPENDECTOMY    . CARDIOVASCULAR STRESS TEST  03/2014   Borderline reversible ischemic changes at the apex.  Normal LV contractility/EF 52%.  . CORONARY STENT PLACEMENT  7/12013  . LEFT HEART CATHETERIZATION WITH CORONARY ANGIOGRAM N/A 06/12/2012   Procedure: LEFT HEART CATHETERIZATION WITH CORONARY ANGIOGRAM;  Surgeon: Clent Demark, MD;  Location: [facility] CATH LAB;  Service: Cardiovascular;  Laterality: N/A;  . MAZE N/A 07/18/2018   Procedure: MAZE;  Surgeon:  Rexene Alberts, MD;  Location: [facility];  Service: Open Heart Surgery;  Laterality: N/A;  . MITRAL VALVE REPLACEMENT N/A 07/18/2018   Procedure: MITRAL VALVE (MV) REPLACEMENT WITH CARBOMEDICS OPTIFORM MITRAL VALVE SIZE 33.;  Surgeon: Rexene Alberts, MD;  Location: [facility];  Service: Open Heart Surgery;  Laterality: N/A;  . PERCUTANEOUS CORONARY STENT INTERVENTION (PCI-S) N/A 06/17/2012   Procedure: PERCUTANEOUS CORONARY STENT INTERVENTION (PCI-S);  Surgeon: Clent Demark, MD;  Location: [facility] CATH LAB;  Service: Cardiovascular;  Laterality: N/A;  . RIGHT/LEFT HEART CATH AND CORONARY ANGIOGRAPHY N/A 06/10/2018   Procedure: RIGHT/LEFT HEART CATH AND CORONARY ANGIOGRAPHY;  Surgeon: Dixie Dials, MD;  Location: [facility] CV LAB;  Service: Cardiovascular;  Laterality: N/A;  . TEE WITHOUT CARDIOVERSION N/A 06/10/2018   Procedure: TRANSESOPHAGEAL ECHOCARDIOGRAM (TEE) with bubble study;  Surgeon: Dixie Dials, MD;  Location[facility] ENDOSCOPY;  Service: Cardiovascular;  Laterality: N/A;  . TEE WITHOUT CARDIOVERSION N/A 07/18/2018   Procedure: TRANSESOPHAGEAL ECHOCARDIOGRAM (TEE);  Surgeon: Rexene Alberts, MD;  Location: [facility];  Service: Open Heart Surgery;  Laterality: N/A;  . TRICUSPID VALVE REPLACEMENT N/A 07/18/2018   Procedure: TRICUSPID VALVE REPAIR WITH EDWARDS MC3 TRICUSPID ANNULOPLASTY RING SIZE 30.;  Surgeon: Rexene Alberts, MD;  Location: [facility];  Service: Open Heart Surgery;  Laterality: N/A;   Social History   Tobacco Use  . Smoking status: Former Smoker    Packs/day: 2.00    Years: 15.00    Pack years: 30.00    Types: Cigarettes    Last attempt to quit: 11/26/1996    Years since quitting: 22.4  . Smokeless tobacco: Never Used  Substance Use Topics  . Alcohol use: No   family history includes Heart disease in his father and mother.  Medications: Current Outpatient Medications  Medication Sig Dispense Refill  . acetaminophen (TYLENOL) 500 MG tablet Take 1,000 mg by mouth 2 (two) times  daily.    . AMBULATORY NON FORMULARY MEDICATION Medication Name: Glucometer, lancets and strip to test every other day. Dx: Diabetes type 2, E11.9. 100 strip and 100 lancets 1 Units PRN  . aspirin 81 MG tablet Take 81 mg by mouth daily.    Marland Kitchen atorvastatin (LIPITOR) 40 MG tablet Take 1 tablet (40 mg total) by mouth every evening. 90 tablet 3  . benzonatate (TESSALON) 200 MG capsule Take 1 capsule (200 mg total) by mouth 3 (three) times daily as needed for cough. 30 capsule 0  . metoprolol succinate (TOPROL-XL) 50 MG 24 hr tablet Take 1 tablet (50 mg total) by mouth daily. Take with or immediately following a meal. 30 tablet 3  . nitroGLYCERIN (NITROSTAT) 0.4 MG SL tablet Place 1 tablet (0.4 mg total) under the tongue every 5 (five) minutes x 3 doses as needed for chest pain. 25 tablet 3  . Omega-3 Fatty Acids (FISH OIL) 1200 MG CAPS Take 1,200 mg by mouth daily.    Marland Kitchen oxymetazoline (AFRIN) 0.05 % nasal spray Place 1 spray into both nostrils daily.    Marland Kitchen  sacubitril-valsartan (ENTRESTO) 24-26 MG Take 1 tablet by mouth 2 (two) times daily. 180 tablet 1  . sildenafil (REVATIO) 20 MG tablet Take 1 tablet (20 mg total) by mouth 3 (three) times daily. 270 tablet 3  . spironolactone (ALDACTONE) 25 MG tablet Take 1 tablet (25 mg total) by mouth daily. 30 tablet 6  . torsemide (DEMADEX) 20 MG tablet Take 2 tablets (40 mg total) by mouth daily. 60 tablet 11  . TRELEGY ELLIPTA 100-62.5-25 MCG/INH AEPB INHALE 1 PUFF INTO THE LUNGS DAILY 60 each 1  . warfarin (COUMADIN) 4 MG tablet Take 1 tablet (4 mg total) by mouth as directed. 90 tablet 3  . doxycycline (VIBRA-TABS) 100 MG tablet Take 1 tablet (100 mg total) by mouth 2 (two) times daily. 14 tablet 0   No current facility-administered medications for this visit.    Allergies  Allergen Reactions  . Ramipril Cough    cough  . Percocet [Oxycodone-Acetaminophen]     In ICU it contributed to hallucinations. Can take by itself      Discussed warning signs or  symptoms. Please see discharge instructions. Patient expresses understanding.

## 2019-04-30 ENCOUNTER — Telehealth: Payer: Self-pay | Admitting: Cardiovascular Disease

## 2019-04-30 NOTE — Telephone Encounter (Signed)
Pt is on antibiotics from his PCP, please call [phone]

## 2019-04-30 NOTE — Telephone Encounter (Signed)
Started doxycycline 100mg  bid on 6/1 by PCP for leg wound.  [facility] INR was 2.4 with goal of 2.5 - 3.5.  Has INR check scheduled for 6/8.  Told pt to Continue coumadin 4mg  daily except only take 2mg  on Saturday night.  Increase greens/salads over the weekend and keep INR appt for Monday.  He verbalized understanding.

## 2019-05-01 ENCOUNTER — Telehealth (HOSPITAL_COMMUNITY): Payer: Self-pay

## 2019-05-01 NOTE — Telephone Encounter (Signed)
Called patient to see if he would be returning to Cardiac Rehab when we reopen. Left voice mail to return call.

## 2019-05-04 ENCOUNTER — Ambulatory Visit (INDEPENDENT_AMBULATORY_CARE_PROVIDER_SITE_OTHER): Payer: BC Managed Care – PPO | Admitting: *Deleted

## 2019-05-04 DIAGNOSIS — Z954 Presence of other heart-valve replacement: Secondary | ICD-10-CM

## 2019-05-04 DIAGNOSIS — I4891 Unspecified atrial fibrillation: Secondary | ICD-10-CM

## 2019-05-04 DIAGNOSIS — Z5181 Encounter for therapeutic drug level monitoring: Secondary | ICD-10-CM

## 2019-05-04 LAB — POCT INR: INR: 2.4 (ref 2.0–3.0)

## 2019-05-04 NOTE — Patient Instructions (Signed)
Take 6mg  tonight then resume 4mg  daily Recheck in 4 weeks Pending 3 dental extractions.  Not scheduled yet. ([place]) Will need Lovenox bridge

## 2019-05-06 ENCOUNTER — Other Ambulatory Visit: Payer: Self-pay | Admitting: Osteopathic Medicine

## 2019-05-06 NOTE — Telephone Encounter (Signed)
Please review

## 2019-05-19 ENCOUNTER — Telehealth (HOSPITAL_COMMUNITY): Payer: Self-pay | Admitting: Physical Therapy

## 2019-05-19 NOTE — Telephone Encounter (Signed)
Called pt. Asked if they planned on returning to rehab on Monday June 29. He said he is so I explained the new procedures to him. Told him he would be required to wear a mask, we would be checking temperatures as well as screening patients for COVID. Then explained the new gym checkin procedures as well as how the class was going to run. He agreed and stated he was excited to return.

## 2019-05-25 ENCOUNTER — Encounter (HOSPITAL_COMMUNITY)
Admission: RE | Admit: 2019-05-25 | Discharge: 2019-05-25 | Disposition: A | Discharge status: Home / Self Care | Payer: BC Managed Care – PPO | Source: Ambulatory Visit | Attending: Internal Medicine | Admitting: Internal Medicine

## 2019-05-25 ENCOUNTER — Other Ambulatory Visit: Payer: Self-pay

## 2019-05-25 DIAGNOSIS — Z9889 Other specified postprocedural states: Secondary | ICD-10-CM

## 2019-05-25 DIAGNOSIS — Z952 Presence of prosthetic heart valve: Secondary | ICD-10-CM

## 2019-05-25 NOTE — Progress Notes (Signed)
Daily Session Note  Patient Details  Name: BANDY HONAKER MRN: [id number] Date of Birth: 06/30/1956 Referring Provider:     CARDIAC REHAB PHASE II ORIENTATION from 01/14/2019 in [place]  Referring Provider  Bensimhon      Encounter Date: 05/25/2019  Check In: Session Check In - 05/25/19 0815      Check-In   Supervising physician immediately available to respond to emergencies  See telemetry face sheet for immediately available MD    Location  [facility]    Staff Present  Russella Dar, MS, EP, [facility], Exercise Physiologist;Cassadie Pankonin Wynetta Emery, RN, Cory Munch, Exercise Physiologist    Virtual Visit  No    Medication changes reported      No    Fall or balance concerns reported     No    Tobacco Cessation  No Change    Warm-up and Cool-down  Performed as group-led instruction    Resistance Training Performed  No    VAD Patient?  No    PAD/SET Patient?  No      Pain Assessment   Currently in Pain?  No/denies    Pain Score  0-No pain    Multiple Pain Sites  No       Capillary Blood Glucose: No results found for this or any previous visit (from the past 24 hour(s)).    Social History   Tobacco Use  Smoking Status Former Smoker  . Packs/day: 2.00  . Years: 15.00  . Pack years: 30.00  . Types: Cigarettes  . Quit date: 11/26/1996  . Years since quitting: 22.5  Smokeless Tobacco Never Used    Goals Met:  Independence with exercise equipment Exercise tolerated well No report of cardiac concerns or symptoms Strength training completed today  Goals Unmet:  Not Applicable  Comments: Pt able to follow exercise prescription today without complaint.  Will continue to monitor for progression. Check out 915.   Dr. Kate Sable is Medical Director for [facility] Cardiac and Pulmonary Rehab.

## 2019-05-27 ENCOUNTER — Other Ambulatory Visit: Payer: Self-pay

## 2019-05-27 ENCOUNTER — Encounter (HOSPITAL_COMMUNITY)
Admission: RE | Admit: 2019-05-27 | Discharge: 2019-05-27 | Disposition: A | Discharge status: Home / Self Care | Payer: BC Managed Care – PPO | Source: Ambulatory Visit | Attending: Internal Medicine | Admitting: Internal Medicine

## 2019-05-27 DIAGNOSIS — Z9889 Other specified postprocedural states: Secondary | ICD-10-CM

## 2019-05-27 DIAGNOSIS — Z952 Presence of prosthetic heart valve: Secondary | ICD-10-CM

## 2019-05-27 NOTE — Progress Notes (Signed)
Daily Session Note  Patient Details  Name: TAEVION SIKORA MRN: [id number] Date of Birth: 1956-04-21 Referring Provider:     CARDIAC REHAB PHASE II ORIENTATION from 01/14/2019 in [place]  Referring Provider  Bensimhon      Encounter Date: 05/27/2019  Check In: Session Check In - 05/27/19 0830      Check-In   Supervising physician immediately available to respond to emergencies  See telemetry face sheet for immediately available MD    Location  [facility]    Staff Present  Russella Dar, MS, EP, [facility], Exercise Physiologist;Debra Wynetta Emery, RN, Cory Munch, Exercise Physiologist    Virtual Visit  No    Medication changes reported      No    Fall or balance concerns reported     No    Tobacco Cessation  No Change    Warm-up and Cool-down  Performed as group-led instruction    Resistance Training Performed  No    VAD Patient?  No    PAD/SET Patient?  No      Pain Assessment   Currently in Pain?  No/denies    Pain Score  0-No pain    Multiple Pain Sites  No       Capillary Blood Glucose: No results found for this or any previous visit (from the past 24 hour(s)).    Social History   Tobacco Use  Smoking Status Former Smoker  . Packs/day: 2.00  . Years: 15.00  . Pack years: 30.00  . Types: Cigarettes  . Quit date: 11/26/1996  . Years since quitting: 22.5  Smokeless Tobacco Never Used    Goals Met:  Independence with exercise equipment Exercise tolerated well Personal goals reviewed No report of cardiac concerns or symptoms Strength training completed today  Goals Unmet:  Not Applicable  Comments: Check out: 0915   Dr. Kate Sable is Medical Director for [place] and Pulmonary Rehab.

## 2019-05-29 ENCOUNTER — Encounter (HOSPITAL_COMMUNITY): Payer: BC Managed Care – PPO

## 2019-06-01 ENCOUNTER — Other Ambulatory Visit: Payer: Self-pay

## 2019-06-01 ENCOUNTER — Ambulatory Visit (INDEPENDENT_AMBULATORY_CARE_PROVIDER_SITE_OTHER): Payer: BC Managed Care – PPO | Admitting: *Deleted

## 2019-06-01 ENCOUNTER — Encounter (HOSPITAL_COMMUNITY): Payer: BC Managed Care – PPO

## 2019-06-01 DIAGNOSIS — I4891 Unspecified atrial fibrillation: Secondary | ICD-10-CM

## 2019-06-01 DIAGNOSIS — Z5181 Encounter for therapeutic drug level monitoring: Secondary | ICD-10-CM

## 2019-06-01 DIAGNOSIS — Z954 Presence of other heart-valve replacement: Secondary | ICD-10-CM | POA: Diagnosis not present

## 2019-06-01 LAB — POCT INR: INR: 2.5 (ref 2.0–3.0)

## 2019-06-01 NOTE — Patient Instructions (Signed)
Increase coumadin to 1 tablet daily except 1 1/2 tablets on Mondays and Thursdays Recheck in 4 weeks Pending 3 dental extractions.  Not scheduled yet. ([place]) Will need Lovenox bridge

## 2019-06-02 ENCOUNTER — Telehealth: Payer: Self-pay | Admitting: Cardiovascular Disease

## 2019-06-02 ENCOUNTER — Other Ambulatory Visit: Payer: Self-pay | Admitting: Osteopathic Medicine

## 2019-06-02 ENCOUNTER — Other Ambulatory Visit: Payer: Self-pay | Admitting: Cardiovascular Disease

## 2019-06-02 MED ORDER — AMOXICILLIN 500 MG PO CAPS: ORAL_CAPSULE | ORAL | 3 refills | Status: DC

## 2019-06-02 NOTE — Telephone Encounter (Signed)
I agree that he requires Lovenox bridging given mechanical mitral valve and SBE prophylaxis.  He has a history of multivessel coronary artery stenting.  I would prefer he remain on aspirin but if it needs to temporarily be held, that would be permissible.

## 2019-06-02 NOTE — Telephone Encounter (Signed)
PharmD to review. Please see previous clearance letter dated 09/11/2018, I suspect the recommendation will be the same including lovenox bridging given mechanical mitral valve and SBE prophylaxis.   Per Otila Kluver from dentist office, patient was cleared last year, however neglected to present for the procedure. It was planned to extract 3 teeth at the time, but he never went for the procedure. He recently reached out to the dentist office regarding worsening teeth pain, at this time, the number of extraction is undeterrmined, but it will be at least 3 extractions in review of last year's finding. Overall, it is low risk procedure, however if at all possible, need to proceed while on aspirin. Just in case the procedure cannot be done on aspirin, I will forward to Dr. Bronson Ing to review if ok to temporarily hold aspirin. (note, last cath in 05/2018 showed nonobstructive disease, he is on both aspirin and coumadin for mechanical valve)

## 2019-06-02 NOTE — Telephone Encounter (Signed)
   Primary Cardiologist: Kate Sable, MD  Chart reviewed as part of pre-operative protocol coverage. Simple dental extractions are considered low risk procedures per guidelines and generally do not require any specific cardiac clearance. It is also generally accepted that for simple extractions and dental cleanings, there is no need to interrupt blood thinner therapy.   SBE prophylaxis is required for the patient. Amoxicillin has been sent to his pharmacy.  I will route this recommendation to the requesting party via Epic fax function and remove from pre-op pool.  Please call with questions. See previous note from Dr. Bronson Ing regarding aspirin. If absolutely need to hold aspirin, it will need to held for 5 days prior to the procedure. Ideally, we prefer the patient to proceed with the procedure on aspirin if at all possible. Our coumadin clinic in [place] will coordinate lovenox bridging. Please inform us once you have a date for the procedure.   [place], [place] 06/02/2019, 6:33 PM

## 2019-06-02 NOTE — Telephone Encounter (Signed)
New message   1. What dental office are you calling from? Dr. Jacqulynn Cadet  2. What is your office phone number? [phone]  3. What is your fax number? [phone]  4. What type of procedure is the patient having performed? Teeth Extractions   5. What date is procedure scheduled or is the patient there now? TBD  (if the patient is at the dentist's office question goes to their cardiologist if he/she is in the office.  If not, question should go to the DOD).   6. What is your question (ex. Antibiotics prior to procedure, holding medication-we need to know how long dentist wants pt to hold med)? Per Otila Kluver waiting on the doctor to decide what needs to be held.

## 2019-06-02 NOTE — Telephone Encounter (Signed)
Agree, pt will require Lovenox bridge due to history of mechanical mitral valve replacement. He will also require pre op antibiotics - amoxicillin has been sent to his [facility].  Will route to Edrick Oh, RN who manages patient's warfarin to help coordinate Lovenox bridge. Does not look as though procedure date has been set.

## 2019-06-02 NOTE — Telephone Encounter (Signed)
Please advise 

## 2019-06-02 NOTE — Telephone Encounter (Signed)
Rx for warfarin sent in.

## 2019-06-03 ENCOUNTER — Encounter (HOSPITAL_COMMUNITY)
Admission: RE | Admit: 2019-06-03 | Discharge: 2019-06-03 | Disposition: A | Discharge status: Home / Self Care | Payer: BC Managed Care – PPO | Source: Ambulatory Visit | Attending: Internal Medicine | Admitting: Internal Medicine

## 2019-06-03 ENCOUNTER — Other Ambulatory Visit: Payer: Self-pay

## 2019-06-03 DIAGNOSIS — Z952 Presence of prosthetic heart valve: Secondary | ICD-10-CM

## 2019-06-03 DIAGNOSIS — Z9889 Other specified postprocedural states: Secondary | ICD-10-CM | POA: Diagnosis not present

## 2019-06-03 NOTE — Telephone Encounter (Signed)
Called the patient Jeffrey Larson and informed him that he was cleared for the dental procedure and that the antibiotic Amoxicillin has been sent to his local pharmacy for him to take 30-60 minutes prior to the procedure. Patient verbalized understanding and all if any questions were answered.

## 2019-06-03 NOTE — Progress Notes (Signed)
Daily Session Note  Patient Details  Name: Jeffrey Larson MRN: [id number] Date of Birth: 06/09/1956 Referring Provider:     CARDIAC REHAB PHASE II ORIENTATION from 01/14/2019 in [place]  Referring Provider  Bensimhon      Encounter Date: 06/03/2019  Check In: Session Check In - 06/03/19 0809      Check-In   Supervising physician immediately available to respond to emergencies  See telemetry face sheet for immediately available MD    Location  [facility]    Staff Present  Russella Dar, MS, EP, [facility], Exercise Physiologist;Dmitriy Gair Wynetta Emery, RN, Cory Munch, Exercise Physiologist    Virtual Visit  No    Medication changes reported      No    Fall or balance concerns reported     No    Tobacco Cessation  No Change    Warm-up and Cool-down  Performed as group-led instruction    Resistance Training Performed  No    VAD Patient?  No    PAD/SET Patient?  No      Pain Assessment   Pain Score  0-No pain    Multiple Pain Sites  No       Capillary Blood Glucose: No results found for this or any previous visit (from the past 24 hour(s)).    Social History   Tobacco Use  Smoking Status Former Smoker  . Packs/day: 2.00  . Years: 15.00  . Pack years: 30.00  . Types: Cigarettes  . Quit date: 11/26/1996  . Years since quitting: 22.5  Smokeless Tobacco Never Used    Goals Met:  Independence with exercise equipment Exercise tolerated well No report of cardiac concerns or symptoms Strength training completed today  Goals Unmet:  Not Applicable  Comments: Pt able to follow exercise prescription today without complaint.  Will continue to monitor for progression. Check out 915.   Dr. Kate Sable is Medical Director for [facility] Cardiac and Pulmonary Rehab.

## 2019-06-05 ENCOUNTER — Other Ambulatory Visit: Payer: Self-pay

## 2019-06-05 ENCOUNTER — Telehealth: Payer: Self-pay

## 2019-06-05 ENCOUNTER — Other Ambulatory Visit: Payer: BC Managed Care – PPO

## 2019-06-05 ENCOUNTER — Encounter (HOSPITAL_COMMUNITY)
Admission: RE | Admit: 2019-06-05 | Discharge: 2019-06-05 | Disposition: A | Discharge status: Home / Self Care | Payer: BC Managed Care – PPO | Source: Ambulatory Visit | Attending: Internal Medicine | Admitting: Internal Medicine

## 2019-06-05 DIAGNOSIS — R6889 Other general symptoms and signs: Secondary | ICD-10-CM | POA: Diagnosis not present

## 2019-06-05 DIAGNOSIS — Z20822 Contact with and (suspected) exposure to covid-19: Secondary | ICD-10-CM

## 2019-06-05 DIAGNOSIS — Z9889 Other specified postprocedural states: Secondary | ICD-10-CM

## 2019-06-05 DIAGNOSIS — Z952 Presence of prosthetic heart valve: Secondary | ICD-10-CM

## 2019-06-05 NOTE — Telephone Encounter (Signed)
Pt left a vm msg that he currently has cold like symptoms. As per pt, he was tested today for COVID-19. However believes he has a cold and that testing will be negative. Requesting recommendation from provider. Pls advise, thanks.

## 2019-06-05 NOTE — Telephone Encounter (Signed)
We are assuming that any kind of respiratory infection can potentially be COVID.  COVID can look like anything from a mild illness to very severe respiratory disease.  I would not have him come into the office, regardless of what test results show since false negatives are still a possibility.

## 2019-06-05 NOTE — Progress Notes (Signed)
Incomplete Session Note  Patient Details  Name: Jeffrey Larson MRN: [id number] Date of Birth: 09-Sep-1956 Referring Provider:     CARDIAC REHAB PHASE II ORIENTATION from 01/14/2019 in [place]  Referring Provider  [name] did not complete his rehab session.  Presented to rehab with a cough and congestion. Was sent to be tested for COVID-19.

## 2019-06-08 ENCOUNTER — Encounter (HOSPITAL_COMMUNITY): Payer: BC Managed Care – PPO

## 2019-06-08 NOTE — Telephone Encounter (Signed)
Attempted to contact pt regarding provider's recommendation. Phone keeps ringing, no option to leave a vm msg.

## 2019-06-09 ENCOUNTER — Telehealth: Payer: Self-pay

## 2019-06-09 NOTE — Telephone Encounter (Signed)
Called and was able to leave pt msg of providers recommendations

## 2019-06-09 NOTE — Telephone Encounter (Signed)
Pt returned a call back requesting for the office to give him a call. He's still not feeling well.  Attempted to contact pt, no answer. Left a second vm informing pt I will try to call him later on today for an update. Direct call back info was provided.

## 2019-06-10 ENCOUNTER — Encounter (HOSPITAL_COMMUNITY): Payer: BC Managed Care – PPO

## 2019-06-11 LAB — NOVEL CORONAVIRUS, NAA: SARS-CoV-2, NAA: NOT DETECTED

## 2019-06-12 ENCOUNTER — Encounter (HOSPITAL_COMMUNITY): Payer: BC Managed Care – PPO

## 2019-06-12 ENCOUNTER — Ambulatory Visit (INDEPENDENT_AMBULATORY_CARE_PROVIDER_SITE_OTHER): Payer: BC Managed Care – PPO | Admitting: Osteopathic Medicine

## 2019-06-12 ENCOUNTER — Encounter: Payer: Self-pay | Admitting: Osteopathic Medicine

## 2019-06-12 DIAGNOSIS — J209 Acute bronchitis, unspecified: Secondary | ICD-10-CM

## 2019-06-12 DIAGNOSIS — J440 Chronic obstructive pulmonary disease with acute lower respiratory infection: Secondary | ICD-10-CM | POA: Diagnosis not present

## 2019-06-12 MED ORDER — BENZONATATE 200 MG PO CAPS: 200.0000 mg | ORAL_CAPSULE | Freq: Three times a day (TID) | ORAL | 0 refills | Status: DC | PRN

## 2019-06-12 MED ORDER — PREDNISONE 20 MG PO TABS: 20.0000 mg | ORAL_TABLET | Freq: Two times a day (BID) | ORAL | 0 refills | Status: DC

## 2019-06-12 MED ORDER — LEVOFLOXACIN 500 MG PO TABS: 500.0000 mg | ORAL_TABLET | Freq: Every day | ORAL | 0 refills | Status: DC

## 2019-06-12 NOTE — Progress Notes (Signed)
Virtual Visit via Video (App used: Doximity) Note  I connected with      Jeffrey Larson on 06/12/19 at 12:00 PM  by a telemedicine application and verified that I am speaking with the correct person using two identifiers.  Patient is at home I am in office    I discussed the limitations of evaluation and management by telemedicine and the availability of in person appointments. The patient expressed understanding and agreed to proceed.  History of Present Illness: Jeffrey Larson is a 63 y.o. male who would like to discuss Cough  Cough ongoing for over 2 weeks weeks, seems to be getting worse. Feels like deep in the chest. Breathing is pretty good, denies SOB. Has been taking the inhaler, using nebulizer. COVID negative test a week ago.      Observations/Objective: There were no vitals taken for this visit. BP Readings from Last 3 Encounters:  04/27/19 (!) 148/76  02/04/19 114/72  01/20/19 140/76   Exam: Normal Speech.  NAD  Lab and Radiology Results No results found for this or any previous visit (from the past 72 hour(s)). No results found.     Assessment and Plan: 63 y.o. male with The encounter diagnosis was Acute bronchitis with COPD ([place]).   PDMP not reviewed this encounter. No orders of the defined types were placed in this encounter.  Meds ordered this encounter  Medications  . benzonatate (TESSALON) 200 MG capsule    Sig: Take 1 capsule (200 mg total) by mouth 3 (three) times daily as needed for cough.    Dispense:  30 capsule    Refill:  0  . levofloxacin (LEVAQUIN) 500 MG tablet    Sig: Take 1 tablet (500 mg total) by mouth daily.    Dispense:  7 tablet    Refill:  0  . predniSONE (DELTASONE) 20 MG tablet    Sig: Take 1 tablet (20 mg total) by mouth 2 (two) times daily with a meal.    Dispense:  10 tablet    Refill:  0   There are no Patient Instructions on file for this visit.  Instructions sent via MyChart. If MyChart not available, pt  was given option for info via personal e-mail w/ no guarantee of protected health info over unsecured e-mail communication, and MyChart sign-up instructions were included.   Follow Up Instructions: Return if symptoms worsen or fail to improve.    I discussed the assessment and treatment plan with the patient. The patient was provided an opportunity to ask questions and all were answered. The patient agreed with the plan and demonstrated an understanding of the instructions.   The patient was advised to call back or seek an in-person evaluation if any new concerns, if symptoms worsen or if the condition fails to improve as anticipated.  15 minutes of non-face-to-face time was provided during this encounter.                      Historical information moved to improve visibility of documentation.  Past Medical History:  Diagnosis Date  . Chronic combined systolic and diastolic congestive heart failure ([name])   . COPD with chronic bronchitis ([name])   . Coronary artery disease   . Essential hypertension, benign    Ramipril to losartan Sept 2015   . History of pneumonia    RML on CXR 07/20/14  . Hyperlipidemia   . Longstanding persistent atrial fibrillation   . Mitral stenosis with  regurgitation   . Myocardial infarction ([name])   . Pulmonary hypertension ([name])   . Renal disorder    history kidney stone  . S/P Maze operation for atrial fibrillation 07/18/2018   Complete bilateral atrial lesion set using bipolar radiofrequency and cryothermy ablation with clipping of LA appendage  . S/P mitral valve replacement with metallic valve 08/20/4627   Sorin Carbomedics Optiform bileaflet mechanical valve, size 33 mm  . S/P TVR (tricuspid valve repair) 07/18/2018   Edwards mc3 ring annuloplasty, size 30 mm   Past Surgical History:  Procedure Laterality Date  . APPENDECTOMY    . CARDIOVASCULAR STRESS TEST  03/2014   Borderline reversible ischemic changes at the apex.  Normal LV  contractility/EF 52%.  . CORONARY STENT PLACEMENT  7/12013  . LEFT HEART CATHETERIZATION WITH CORONARY ANGIOGRAM N/A 06/12/2012   Procedure: LEFT HEART CATHETERIZATION WITH CORONARY ANGIOGRAM;  Surgeon: Clent Demark, MD;  Location: [facility] CATH LAB;  Service: Cardiovascular;  Laterality: N/A;  . MAZE N/A 07/18/2018   Procedure: MAZE;  Surgeon: Rexene Alberts, MD;  Location: [place];  Service: Open Heart Surgery;  Laterality: N/A;  . MITRAL VALVE REPLACEMENT N/A 07/18/2018   Procedure: MITRAL VALVE (MV) REPLACEMENT WITH CARBOMEDICS OPTIFORM MITRAL VALVE SIZE 33.;  Surgeon: Rexene Alberts, MD;  Location: [place];  Service: Open Heart Surgery;  Laterality: N/A;  . PERCUTANEOUS CORONARY STENT INTERVENTION (PCI-S) N/A 06/17/2012   Procedure: PERCUTANEOUS CORONARY STENT INTERVENTION (PCI-S);  Surgeon: Clent Demark, MD;  Location: [facility] CATH LAB;  Service: Cardiovascular;  Laterality: N/A;  . RIGHT/LEFT HEART CATH AND CORONARY ANGIOGRAPHY N/A 06/10/2018   Procedure: RIGHT/LEFT HEART CATH AND CORONARY ANGIOGRAPHY;  Surgeon: Dixie Dials, MD;  Location: [place] CV LAB;  Service: Cardiovascular;  Laterality: N/A;  . TEE WITHOUT CARDIOVERSION N/A 06/10/2018   Procedure: TRANSESOPHAGEAL ECHOCARDIOGRAM (TEE) with bubble study;  Surgeon: Dixie Dials, MD;  Location: [facility] ENDOSCOPY;  Service: Cardiovascular;  Laterality: N/A;  . TEE WITHOUT CARDIOVERSION N/A 07/18/2018   Procedure: TRANSESOPHAGEAL ECHOCARDIOGRAM (TEE);  Surgeon: Rexene Alberts, MD;  Location: [place];  Service: Open Heart Surgery;  Laterality: N/A;  . TRICUSPID VALVE REPLACEMENT N/A 07/18/2018   Procedure: TRICUSPID VALVE REPAIR WITH EDWARDS MC3 TRICUSPID ANNULOPLASTY RING SIZE 30.;  Surgeon: Rexene Alberts, MD;  Location: [place];  Service: Open Heart Surgery;  Laterality: N/A;   Social History   Tobacco Use  . Smoking status: Former Smoker    Packs/day: 2.00    Years: 15.00    Pack years: 30.00    Types: Cigarettes    Quit date: 11/26/1996     Years since quitting: 22.5  . Smokeless tobacco: Never Used  Substance Use Topics  . Alcohol use: No   family history includes Heart disease in his father and mother.  Medications: Current Outpatient Medications  Medication Sig Dispense Refill  . acetaminophen (TYLENOL) 500 MG tablet Take 1,000 mg by mouth 2 (two) times daily.    . AMBULATORY NON FORMULARY MEDICATION Medication Name: Glucometer, lancets and strip to test every other day. Dx: Diabetes type 2, E11.9. 100 strip and 100 lancets 1 Units PRN  . aspirin 81 MG tablet Take 81 mg by mouth daily.    Marland Kitchen atorvastatin (LIPITOR) 40 MG tablet Take 1 tablet (40 mg total) by mouth every evening. 90 tablet 3  . Fluticasone-Umeclidin-Vilant (TRELEGY ELLIPTA) 100-62.5-25 MCG/INH AEPB Inhale 1 puff into the lungs daily. NEEDS TO SCHEDULE OFFICE VISIT 60 each 0  . metoprolol succinate (  TOPROL-XL) 50 MG 24 hr tablet Take 1 tablet (50 mg total) by mouth daily. Take with or immediately following a meal. 30 tablet 3  . nitroGLYCERIN (NITROSTAT) 0.4 MG SL tablet Place 1 tablet (0.4 mg total) under the tongue every 5 (five) minutes x 3 doses as needed for chest pain. 25 tablet 3  . Omega-3 Fatty Acids (FISH OIL) 1200 MG CAPS Take 1,200 mg by mouth daily.    Marland Kitchen oxymetazoline (AFRIN) 0.05 % nasal spray Place 1 spray into both nostrils daily.    . sacubitril-valsartan (ENTRESTO) 24-26 MG Take 1 tablet by mouth 2 (two) times daily. 180 tablet 1  . sildenafil (REVATIO) 20 MG tablet Take 1 tablet (20 mg total) by mouth 3 (three) times daily. 270 tablet 3  . spironolactone (ALDACTONE) 25 MG tablet Take 1 tablet (25 mg total) by mouth daily. 30 tablet 6  . torsemide (DEMADEX) 20 MG tablet Take 2 tablets (40 mg total) by mouth daily. 60 tablet 11  . warfarin (COUMADIN) 4 MG tablet Take 1 tablet daily except 1 1/2 tablets on Mondays and Thursdays or as directed 40 tablet 4  . amoxicillin (AMOXIL) 500 MG capsule Take 4 capsules by mouth 30-60 minutes prior to dental  procedure. (Patient not taking: Reported on 06/12/2019) 4 capsule 3  . benzonatate (TESSALON) 200 MG capsule Take 1 capsule (200 mg total) by mouth 3 (three) times daily as needed for cough. 30 capsule 0  . levofloxacin (LEVAQUIN) 500 MG tablet Take 1 tablet (500 mg total) by mouth daily. 7 tablet 0  . predniSONE (DELTASONE) 20 MG tablet Take 1 tablet (20 mg total) by mouth 2 (two) times daily with a meal. 10 tablet 0   No current facility-administered medications for this visit.    Allergies  Allergen Reactions  . Ramipril Cough    cough  . Percocet [Oxycodone-Acetaminophen]     In ICU it contributed to hallucinations. Can take by itself    PDMP not reviewed this encounter. No orders of the defined types were placed in this encounter.  Meds ordered this encounter  Medications  . benzonatate (TESSALON) 200 MG capsule    Sig: Take 1 capsule (200 mg total) by mouth 3 (three) times daily as needed for cough.    Dispense:  30 capsule    Refill:  0  . levofloxacin (LEVAQUIN) 500 MG tablet    Sig: Take 1 tablet (500 mg total) by mouth daily.    Dispense:  7 tablet    Refill:  0  . predniSONE (DELTASONE) 20 MG tablet    Sig: Take 1 tablet (20 mg total) by mouth 2 (two) times daily with a meal.    Dispense:  10 tablet    Refill:  0

## 2019-06-15 ENCOUNTER — Encounter (HOSPITAL_COMMUNITY): Payer: BC Managed Care – PPO

## 2019-06-15 NOTE — Progress Notes (Signed)
Cardiac Individual Treatment Plan  Patient Details  Name: Jeffrey Larson MRN: [id number] Date of Birth: 1956/03/26 Referring Provider:     CARDIAC REHAB Jeffrey Larson from 01/14/2019 in [place]  Referring Provider  Jeffrey Larson      Initial Encounter Date:    CARDIAC REHAB PHASE II ORIENTATION from 01/14/2019 in [place]  Date  01/14/19      Visit Diagnosis: S/P mitral valve replacement   S/P tricuspid valve repair   Patient's Home Medications on Admission:  Current Outpatient Medications:  .  acetaminophen (TYLENOL) 500 MG tablet, Take 1,000 mg by mouth 2 (two) times daily., Disp: , Rfl:  .  AMBULATORY NON FORMULARY MEDICATION, Medication Name: Glucometer, lancets and strip to test every other day. Dx: Diabetes type 2, E11.9. 100 strip and 100 lancets, Disp: 1 Units, Rfl: PRN .  amoxicillin (AMOXIL) 500 MG capsule, Take 4 capsules by mouth 30-60 minutes prior to dental procedure. (Patient not taking: Reported on 06/12/2019), Disp: 4 capsule, Rfl: 3 .  aspirin 81 MG tablet, Take 81 mg by mouth daily., Disp: , Rfl:  .  atorvastatin (LIPITOR) 40 MG tablet, Take 1 tablet (40 mg total) by mouth every evening., Disp: 90 tablet, Rfl: 3 .  benzonatate (TESSALON) 200 MG capsule, Take 1 capsule (200 mg total) by mouth 3 (three) times daily as needed for cough., Disp: 30 capsule, Rfl: 0 .  Fluticasone-Umeclidin-Vilant (TRELEGY ELLIPTA) 100-62.5-25 MCG/INH AEPB, Inhale 1 puff into the lungs daily. NEEDS TO SCHEDULE OFFICE VISIT, Disp: 60 each, Rfl: 0 .  levofloxacin (LEVAQUIN) 500 MG tablet, Take 1 tablet (500 mg total) by mouth daily., Disp: 7 tablet, Rfl: 0 .  metoprolol succinate (TOPROL-XL) 50 MG 24 hr tablet, Take 1 tablet (50 mg total) by mouth daily. Take with or immediately following a meal., Disp: 30 tablet, Rfl: 3 .  nitroGLYCERIN (NITROSTAT) 0.4 MG SL tablet, Place 1 tablet (0.4 mg total) under the tongue every 5 (five) minutes x  3 doses as needed for chest pain., Disp: 25 tablet, Rfl: 3 .  Omega-3 Fatty Acids (FISH OIL) 1200 MG CAPS, Take 1,200 mg by mouth daily., Disp: , Rfl:  .  oxymetazoline (AFRIN) 0.05 % nasal spray, Place 1 spray into both nostrils daily., Disp: , Rfl:  .  predniSONE (DELTASONE) 20 MG tablet, Take 1 tablet (20 mg total) by mouth 2 (two) times daily with a meal., Disp: 10 tablet, Rfl: 0 .  sacubitril-valsartan (ENTRESTO) 24-26 MG, Take 1 tablet by mouth 2 (two) times daily., Disp: 180 tablet, Rfl: 1 .  sildenafil (REVATIO) 20 MG tablet, Take 1 tablet (20 mg total) by mouth 3 (three) times daily., Disp: 270 tablet, Rfl: 3 .  spironolactone (ALDACTONE) 25 MG tablet, Take 1 tablet (25 mg total) by mouth daily., Disp: 30 tablet, Rfl: 6 .  torsemide (DEMADEX) 20 MG tablet, Take 2 tablets (40 mg total) by mouth daily., Disp: 60 tablet, Rfl: 11 .  warfarin (COUMADIN) 4 MG tablet, Take 1 tablet daily except 1 1/2 tablets on Mondays and Thursdays or as directed, Disp: 40 tablet, Rfl: 4  Past Medical History: Past Medical History:  Diagnosis Date  . Chronic combined systolic and diastolic congestive heart failure ([place])   . COPD with chronic bronchitis ([place])   . Coronary artery disease   . Essential hypertension, benign    Ramipril to losartan Sept 2015   . History of pneumonia    RML on CXR 07/20/14  . Hyperlipidemia   .  Longstanding persistent atrial fibrillation   . Mitral stenosis with regurgitation   . Myocardial infarction ([place])   . Pulmonary hypertension ([place])   . Renal disorder    history kidney stone  . S/P Maze operation for atrial fibrillation 07/18/2018   Complete bilateral atrial lesion set using bipolar radiofrequency and cryothermy ablation with clipping of LA appendage  . S/P mitral valve replacement with metallic valve 3/47/4259   Sorin [employer]omedics Optiform bileaflet mechanical valve, size 33 mm  . S/P TVR (tricuspid valve repair) 07/18/2018   Edwards mc3 ring annuloplasty, size 30 mm     Tobacco Use: Social History   Tobacco Use  Smoking Status Former Smoker  . Packs/day: 2.00  . Years: 15.00  . Pack years: 30.00  . Types: Cigarettes  . Quit date: 11/26/1996  . Years since quitting: 22.5  Smokeless Tobacco Never Used    Labs: Recent Review Flowsheet Data    Labs for ITP Cardiac and Pulmonary Rehab Latest Ref Rng & Units 07/21/2018 07/28/2018 07/29/2018 07/30/2018 12/08/2018   Cholestrol <200 mg/dL - - - - 139   LDLCALC mg/dL (calc) - - - - 77   HDL >40 mg/dL - - - - 33(L)   Trlycerides <150 mg/dL - - - - 194(H)   Hemoglobin A1c <5.7 % of total Hgb - - - - 5.6   PHART 7.350 - 7.450 - - - - -   PCO2ART 32.0 - 48.0 mmHg - - - - -   HCO3 20.0 - 28.0 mmol/L - - - - -   TCO2 22 - 32 mmol/L 27 - - - -   ACIDBASEDEF 0.0 - 2.0 mmol/L - - - - -   O2SAT % 68.5 58.5 58.3 59.1 -      Capillary Blood Glucose: Lab Results  Component Value Date   GLUCAP 114 (H) 07/25/2018   GLUCAP 101 (H) 07/25/2018   GLUCAP 102 (H) 07/25/2018   GLUCAP 147 (H) 07/24/2018   GLUCAP 87 07/24/2018     Exercise Target Goals: Exercise Program Goal: Individual exercise prescription set using results from initial 6 min walk test and THRR while considering  patient's activity barriers and safety.   Exercise Prescription Goal: Starting with aerobic activity 30 plus minutes a day, 3 days per week for initial exercise prescription. Provide home exercise prescription and guidelines that participant acknowledges understanding prior to discharge.  Activity Barriers & Risk Stratification: Activity Barriers & Cardiac Risk Stratification - 01/14/19 1348      Activity Barriers & Cardiac Risk Stratification   Activity Barriers  None    Cardiac Risk Stratification  High       6 Minute Walk: 6 Minute Walk    Row Name 01/14/19 1346         6 Minute Walk   Phase  Initial     Distance  1300 feet     Walk Time  6 minutes     # of Rest Breaks  0     MPH  2.46     METS  2.88     RPE  9      Perceived Dyspnea   7     VO2 Peak  9.23     Symptoms  No     Resting HR  78 bpm     Resting BP  112/78     Resting Oxygen Saturation   97 %     Exercise Oxygen Saturation  during 6 min walk  97 %  Max Ex. HR  116 bpm     Max Ex. BP  128/74     2 Minute Post BP  114/76        Oxygen Initial Assessment:   Oxygen Re-Evaluation:   Oxygen Discharge (Final Oxygen Re-Evaluation):   Initial Exercise Prescription: Initial Exercise Prescription - 01/14/19 1300      Date of Initial Exercise RX and Referring Provider   Date  01/14/19    Referring Provider  Bensimhon    Expected Discharge Date  04/14/19      Treadmill   MPH  1.4    Grade  0    Minutes  17    METs  2.07      Recumbant Elliptical   Level  1    RPM  40    Watts  35    Minutes  17    METs  2      Prescription Details   Frequency (times per week)  3    Duration  Progress to 30 minutes of continuous aerobic without signs/symptoms of physical distress      Intensity   THRR 40-80% of Max Heartrate  [id number]    Ratings of Perceived Exertion  11-13    Perceived Dyspnea  0-4      Progression   Progression  Continue to progress workloads to maintain intensity without signs/symptoms of physical distress.      Resistance Training   Training Prescription  Yes    Weight  1    Reps  10-15       Perform Capillary Blood Glucose checks as needed.  Exercise Prescription Changes:  Exercise Prescription Changes    Row Name 01/14/19 1300 02/03/19 0700 02/16/19 1000 06/11/19 1200       Response to Exercise   Blood Pressure (Admit)  112/78  110/62  106/70  106/68    Blood Pressure (Exercise)  128/74  134/62  108/70  144/70    Blood Pressure (Exit)  114/76  104/68  110/62  110/68    Heart Rate (Admit)  78 bpm  83 bpm  85 bpm  73 bpm    Heart Rate (Exercise)  116 bpm  118 bpm  115 bpm  115 bpm    Heart Rate (Exit)  99 bpm  116 bpm  117 bpm  105 bpm    Oxygen Saturation (Admit)  97 %  -  -  -    Oxygen  Saturation (Exercise)  97 %  -  -  -    Oxygen Saturation (Exit)  98 %  -  -  -    Rating of Perceived Exertion (Exercise)  9  11  11  12     Perceived Dyspnea (Exercise)  7  -  -  -    Comments  6 minute walk test   first two weeks of exercose   -  back after COVID    Duration  Progress to 30 minutes of  aerobic without signs/symptoms of physical distress  Continue with 30 min of aerobic exercise without signs/symptoms of physical distress.  Continue with 30 min of aerobic exercise without signs/symptoms of physical distress.  Continue with 30 min of aerobic exercise without signs/symptoms of physical distress.    Intensity  THRR New 110-126-142  THRR unchanged  THRR unchanged  THRR unchanged      Progression   Progression  -  Continue to progress workloads to maintain intensity without signs/symptoms of physical distress.  Continue to progress workloads to maintain intensity without signs/symptoms of physical distress.  Continue to progress workloads to maintain intensity without signs/symptoms of physical distress.    Average METs  -  3.66  3.45  3.25      Resistance Training   Training Prescription  -  Yes  Yes  Yes    Weight  -  2  2  3     Reps  -  10-15  10-15  10-15      Treadmill   MPH  -  1.6  1.9  2.2    Grade  -  0  0  0    Minutes  -  17  17  17     METs  -  2.22  2.45  2.68      NuStep   Level  -  -  -  2    SPM  -  -  -  137    Minutes  -  -  -  22    METs  -  -  -  3.82      Recumbant Elliptical   Level  -  1  1  -    RPM  -  63  58  -    Watts  -  90  80  -    Minutes  -  22  22  -    METs  -  5.1  4.4  -      Home Exercise Plan   Plans to continue exercise at  Home (comment)  Home (comment)  Home (comment)  Home (comment)    Frequency  Add 2 additional days to program exercise sessions.  Add 2 additional days to program exercise sessions.  Add 2 additional days to program exercise sessions.  Add 2 additional days to program exercise sessions.    Initial Home  Exercises Provided  01/14/19  01/14/19  01/14/19  01/14/19       Exercise Comments:  Exercise Comments    Row Name 01/27/19 0800 02/16/19 1010 06/11/19 1240 06/15/19 1344     Exercise Comments  Pt. is new to CR, he has attended 5 sessions. He is walking on the TM and riding the XR. Both machines he has tolerated well and is pushing himself to grow stronger on. We will begin to increase his workloads as tolerated.   Pt. has attended 10 sessions so far. He continues to tolerate the exercise well on both machines and is up to 1.9 MPH on the TM.   Pt. returned after being out due to [place] 19. He remained active at home so was able to come back to CR and pick up right where he left off. We will continue to monitor and progress him as needed.  Pt. has tolerated the exercise well since returning. He is out right now due to sickness but we will adjust his prescription as needed when he is able to come back.       Exercise Goals and Review:  Exercise Goals    Row Name 01/14/19 1352             Exercise Goals   Increase Physical Activity  Yes       Intervention  Develop an individualized exercise prescription for aerobic and resistive training based on initial evaluation findings, risk stratification, comorbidities and participant's personal goals.;Provide advice, education, support and counseling about physical activity/exercise needs.       Expected Outcomes  Short Term: Attend rehab on a regular basis to increase amount of physical activity.;Long Term: Add in home exercise to make exercise part of routine and to increase amount of physical activity.;Long Term: Exercising regularly at least 3-5 days a week.       Increase Strength and Stamina  Yes       Intervention  Provide advice, education, support and counseling about physical activity/exercise needs.;Develop an individualized exercise prescription for aerobic and resistive training based on initial evaluation findings, risk stratification,  comorbidities and participant's personal goals.       Expected Outcomes  Short Term: Increase workloads from initial exercise prescription for resistance, speed, and METs.;Short Term: Perform resistance training exercises routinely during rehab and add in resistance training at home;Long Term: Improve cardiorespiratory fitness, muscular endurance and strength as measured by increased METs and functional capacity (6MWT)       Able to understand and use rate of perceived exertion (RPE) scale  Yes       Intervention  Provide education and explanation on how to use RPE scale       Expected Outcomes  Short Term: Able to use RPE daily in rehab to express subjective intensity level;Long Term:  Able to use RPE to guide intensity level when exercising independently       Able to understand and use Dyspnea scale  Yes       Intervention  Provide education and explanation on how to use Dyspnea scale       Expected Outcomes  Short Term: Able to use Dyspnea scale daily in rehab to express subjective sense of shortness of breath during exertion;Long Term: Able to use Dyspnea scale to guide intensity level when exercising independently       Knowledge and understanding of Target Heart Rate Range (THRR)  Yes       Intervention  Provide education and explanation of THRR including how the numbers were predicted and where they are located for reference       Expected Outcomes  Short Term: Able to state/look up THRR       Able to check pulse independently  Yes       Intervention  Provide education and demonstration on how to check pulse in carotid and radial arteries.;Review the importance of being able to check your own pulse for safety during independent exercise       Expected Outcomes  Short Term: Able to explain why pulse checking is important during independent exercise;Long Term: Able to check pulse independently and accurately       Understanding of Exercise Prescription  Yes       Intervention  Provide education,  explanation, and written materials on patient's individual exercise prescription       Expected Outcomes  Short Term: Able to explain program exercise prescription;Long Term: Able to explain home exercise prescription to exercise independently          Exercise Goals Re-Evaluation : Exercise Goals Re-Evaluation    Row Name 01/27/19 0758 02/16/19 1009 06/15/19 1342         Exercise Goal Re-Evaluation   Exercise Goals Review  Increase Physical Activity;Increase Strength and Stamina;Able to understand and use rate of perceived exertion (RPE) scale;Knowledge and understanding of Target Heart Rate Range (THRR);Able to check pulse independently;Understanding of Exercise Prescription  Increase Physical Activity;Increase Strength and Stamina;Able to understand and use rate of perceived exertion (RPE) scale;Knowledge and understanding of Target Heart Rate Range (THRR);Able to check pulse independently;Understanding of Exercise  Prescription  Increase Physical Activity;Increase Strength and Stamina;Able to understand and use rate of perceived exertion (RPE) scale;Knowledge and understanding of Target Heart Rate Range (THRR);Able to check pulse independently;Understanding of Exercise Prescription     Comments  Pt. is new to the program, he has attended 5 sessions so far. He has tolerated the exercise well. We will begin in increase his workloads as tolerated.   Pt. is still fairly new to CR. He has now attended 10 sessions. We have progressed him to 1.9 MPH on the TM and he has tolerated it well.   Pt. was doing well after returning to Cardiac Rehab. He has been out for 2 weeks due to being sick. When he returns we will reevaluate his exercise prescription based on what he can tolerate.     Expected Outcomes  lose weight and develop heart healthy habits.   lose weight and develop heart healthy habits.   Short: get back in to a regular exercise routine. Long: lose weight and keep it off.         Discharge  Exercise Prescription (Final Exercise Prescription Changes): Exercise Prescription Changes - 06/11/19 1200      Response to Exercise   Blood Pressure (Admit)  106/68    Blood Pressure (Exercise)  144/70    Blood Pressure (Exit)  110/68    Heart Rate (Admit)  73 bpm    Heart Rate (Exercise)  115 bpm    Heart Rate (Exit)  105 bpm    Rating of Perceived Exertion (Exercise)  12    Comments  back after COVID    Duration  Continue with 30 min of aerobic exercise without signs/symptoms of physical distress.    Intensity  THRR unchanged      Progression   Progression  Continue to progress workloads to maintain intensity without signs/symptoms of physical distress.    Average METs  3.25      Resistance Training   Training Prescription  Yes    Weight  3    Reps  10-15      Treadmill   MPH  2.2    Grade  0    Minutes  17    METs  2.68      NuStep   Level  2    SPM  137    Minutes  22    METs  3.82      Home Exercise Plan   Plans to continue exercise at  Home (comment)    Frequency  Add 2 additional days to program exercise sessions.    Initial Home Exercises Provided  01/14/19       Nutrition:  Target Goals: Understanding of nutrition guidelines, daily intake of sodium 1500mg , cholesterol 200mg , calories 30% from fat and 7% or less from saturated fats, daily to have 5 or more servings of fruits and vegetables.  Biometrics: Pre Biometrics - 01/14/19 1352      Pre Biometrics   Height  5\' 11"  (1.803 m)    Waist Circumference  55 inches    Hip Circumference  46 inches    Waist to Hip Ratio  1.2 %    Triceps Skinfold  15 mm    % Body Fat  39.1 %    Grip Strength  14.8 kg    Single Leg Stand  60 seconds        Nutrition Therapy Plan and Nutrition Goals: Nutrition Therapy & Goals - 06/15/19 1540  Nutrition Therapy   RD appointment deferred  Yes      Intervention Plan   Intervention  Nutrition handout(s) given to patient.       Nutrition  Assessments: Nutrition Assessments - 01/14/19 1447      MEDFICTS Scores   Pre Score  108       Nutrition Goals Re-Evaluation:   Nutrition Goals Discharge (Final Nutrition Goals Re-Evaluation):   Psychosocial: Target Goals: Acknowledge presence or absence of significant depression and/or stress, maximize coping skills, provide positive support system. Participant is able to verbalize types and ability to use techniques and skills needed for reducing stress and depression.  Initial Review & Psychosocial Screening: Initial Psych Review & Screening - 01/14/19 1445      Initial Review   Current issues with  None Identified      Family Dynamics   Good Support System?  Yes      Barriers   Psychosocial barriers to participate in program  There are no identifiable barriers or psychosocial needs.      Screening Interventions   Interventions  Encouraged to exercise    Expected Outcomes  Short Term goal: Identification and review with participant of any Quality of Life or Depression concerns found by scoring the questionnaire.;Long Term goal: The participant improves quality of Life and PHQ9 Scores as seen by post scores and/or verbalization of changes       Quality of Life Scores: Quality of Life - 01/14/19 1354      Quality of Life   Select  Quality of Life      Quality of Life Scores   Health/Function Pre  29.6 %    Socioeconomic Pre  28.93 %    Psych/Spiritual Pre  28.29 %    Family Pre  28.8 %    GLOBAL Pre  29.07 %      Scores of 19 and below usually indicate a poorer quality of life in these areas.  A difference of  2-3 points is a clinically meaningful difference.  A difference of 2-3 points in the total score of the Quality of Life Index has been associated with significant improvement in overall quality of life, self-image, physical symptoms, and general health in studies assessing change in quality of life.  PHQ-9: Recent Review Flowsheet Data    Depression screen  [facility] 2/9 01/14/2019 08/02/2017   Decreased Interest 0 0   Down, Depressed, Hopeless 0 0   PHQ - 2 Score 0 0   Altered sleeping 0 -   Tired, decreased energy 0 -   Change in appetite 0 -   Feeling bad or failure about yourself  0 -   Trouble concentrating 0 -   Moving slowly or fidgety/restless 0 -   Suicidal thoughts 0 -   PHQ-9 Score 0 -   Difficult doing work/chores Not difficult at all -     Interpretation of Total Score  Total Score Depression Severity:  1-4 = Minimal depression, 5-9 = Mild depression, 10-14 = Moderate depression, 15-19 = Moderately severe depression, 20-27 = Severe depression   Psychosocial Evaluation and Intervention: Psychosocial Evaluation - 01/14/19 1445      Psychosocial Evaluation & Interventions   Interventions  Encouraged to exercise with the program and follow exercise prescription    Continue Psychosocial Services   No Follow up required       Psychosocial Re-Evaluation: Psychosocial Re-Evaluation    Row Name 01/28/19 1509 02/16/19 1119 06/15/19 1540  Psychosocial Re-Evaluation   Current issues with  None Identified  None Identified  None Identified     Comments  Patient's initial QOL score was 29.07 and his PHQ-9 score was 0 with no psychosocial issues identified.   Patient's initial QOL score was 29.07 and his PHQ-9 score was 0 with no psychosocial issues identified.   Patient's initial QOL score was 29.07 and his PHQ-9 score was 0 with no psychosocial issues identified.      Expected Outcomes  Patient will have no psychosocial issues identified at discharge.   Patient will have no psychosocial issues identified at discharge.   Patient will have no psychosocial issues identified at discharge.      Interventions  Stress management education;Encouraged to attend Cardiac Rehabilitation for the exercise;Relaxation education  Stress management education;Encouraged to attend Cardiac Rehabilitation for the exercise;Relaxation education  Stress  management education;Encouraged to attend Cardiac Rehabilitation for the exercise;Relaxation education     Continue Psychosocial Services   No Follow up required  No Follow up required  No Follow up required        Psychosocial Discharge (Final Psychosocial Re-Evaluation): Psychosocial Re-Evaluation - 06/15/19 1540      Psychosocial Re-Evaluation   Current issues with  None Identified    Comments  Patient's initial QOL score was 29.07 and his PHQ-9 score was 0 with no psychosocial issues identified.     Expected Outcomes  Patient will have no psychosocial issues identified at discharge.     Interventions  Stress management education;Encouraged to attend Cardiac Rehabilitation for the exercise;Relaxation education    Continue Psychosocial Services   No Follow up required       Vocational Rehabilitation: Provide vocational rehab assistance to qualifying candidates.   Vocational Rehab Evaluation & Intervention: Vocational Rehab - 01/14/19 1448      Initial Vocational Rehab Evaluation & Intervention   Assessment shows need for Vocational Rehabilitation  No       Education: Education Goals: Education classes will be provided on a weekly basis, covering required topics. Participant will state understanding/return demonstration of topics presented.  Learning Barriers/Preferences: Learning Barriers/Preferences - 01/14/19 1447      Learning Barriers/Preferences   Learning Barriers  None    Learning Preferences  Audio;Group Instruction;Skilled Demonstration;Individual Instruction       Education Topics: Hypertension, Hypertension Reduction -Define heart disease and high blood pressure. Discus how high blood pressure affects the body and ways to reduce high blood pressure.   CARDIAC REHAB PHASE II EXERCISE from 06/03/2019 in [place]  Date  05/27/19  Educator  DC  Instruction Review Code  2- Demonstrated Understanding      Exercise and Your  Heart -Discuss why it is important to exercise, the FITT principles of exercise, normal and abnormal responses to exercise, and how to exercise safely.   CARDIAC REHAB PHASE II EXERCISE from 06/03/2019 in [place]  Date  06/03/19  Educator  D. Coad  Instruction Review Code  2- Demonstrated Understanding      Angina -Discuss definition of angina, causes of angina, treatment of angina, and how to decrease risk of having angina.   Cardiac Medications -Review what the following cardiac medications are used for, how they affect the body, and side effects that may occur when taking the medications.  Medications include Aspirin, Beta blockers, calcium channel blockers, ACE Inhibitors, angiotensin receptor blockers, diuretics, digoxin, and antihyperlipidemics.   CARDIAC REHAB PHASE II EXERCISE from 06/03/2019 in [place]  Date  01/21/19  Educator  Wynetta Emery  Instruction Review Code  2- Demonstrated Understanding      Congestive Heart Failure -Discuss the definition of CHF, how to live with CHF, the signs and symptoms of CHF, and how keep track of weight and sodium intake.   CARDIAC REHAB PHASE II EXERCISE from 06/03/2019 in [place]  Date  01/28/19  Educator  Orlinda Blalock  Instruction Review Code  2- Demonstrated Understanding      Heart Disease and Intimacy -Discus the effect sexual activity has on the heart, how changes occur during intimacy as we age, and safety during sexual activity.   CARDIAC REHAB PHASE II EXERCISE from 06/03/2019 in [place]  Date  02/04/19  Educator  Wynetta Emery  Instruction Review Code  2- Demonstrated Understanding      Smoking Cessation / COPD -Discuss different methods to quit smoking, the health benefits of quitting smoking, and the definition of COPD.   Nutrition I: Fats -Discuss the types of cholesterol, what cholesterol does to the heart, and how cholesterol levels  can be controlled.   Nutrition II: Labels -Discuss the different components of food labels and how to read food label   Heart Parts/Heart Disease and PAD -Discuss the anatomy of the heart, the pathway of blood circulation through the heart, and these are affected by heart disease.   Stress I: Signs and Symptoms -Discuss the causes of stress, how stress may lead to anxiety and depression, and ways to limit stress.   Stress II: Relaxation -Discuss different types of relaxation techniques to limit stress.   Warning Signs of Stroke / TIA -Discuss definition of a stroke, what the signs and symptoms are of a stroke, and how to identify when someone is having stroke.   Knowledge Questionnaire Score: Knowledge Questionnaire Score - 01/14/19 1447      Knowledge Questionnaire Score   Pre Score  23/24       Core Components/Risk Factors/Patient Goals at Admission: Personal Goals and Risk Factors at Admission - 01/14/19 1448      Core Components/Risk Factors/Patient Goals on Admission    Weight Management  Yes    Admit Weight  295 lb 9.6 oz (134.1 kg)    Goal Weight: Short Term  285 lb 9.6 oz (129.5 kg)    Goal Weight: Long Term  275 lb 9.6 oz (125 kg)    Expected Outcomes  Short Term: Continue to assess and modify interventions until short term weight is achieved;Long Term: Adherence to nutrition and physical activity/exercise program aimed toward attainment of established weight goal    Personal Goal Other  Yes    Personal Goal  Lose 50lbs. and keep it off, gain heart health    Intervention  Attend program 3 x week and supplement with at home exercise 2 x week.     Expected Outcomes  Reach personal goals       Core Components/Risk Factors/Patient Goals Review:  Goals and Risk Factor Review    Row Name 01/28/19 1507 02/16/19 1116 06/15/19 1540         Core Components/Risk Factors/Patient Goals Review   Personal Goals Review  Weight Management/Obesity Lose 50 lbs; get heart  healthy.   Weight Management/Obesity Lose 50 lbs long term. Get heart healthy; keep weight off.   Weight Management/Obesity Lose 50 lbs long term/get heart healthy; keep weight off.     Review  Patient has completed 6 sessions gaining 2 lbs since his orientation visit.  He says he has not attended enough to notice any progress. Will continue to monitor for progress.   Patient has completed 10 sessions gaining 7 lbs since last 30 day review. He admits to not taking his diuretic as prescribed especially on weekends due to side effects of frequent urination. He was encouraged to comply with all medications prescribed. He is doing well in the program with progression. He says he does feel stronger and feels the program is helping him meet his goals. Outpatient cardiac rehab services have been suspended due to the COVID-19 restrictions. Will continue to montior.   Cardiac Rehab closed 02/09/19 and reopened 05/25/19. Patient returned to the program gaining 20 lbs during the closure. He blames his weight gain on inactivity. Patient was sent for COVID-19 testing 06/05/19 due to respiratory symptoms. Test was negative but he can not return until he is symptom free per our Medical directors adivse. Patient has a televisit 06/12/19 with his PCP and was diagnosed with acute bronchitis and COPD exacerbation. He prescribed Prednisone, Levaquin, and Tessalon. Patient was told to call 06/25/19 which is 14 days after his test to see if he is symptom free. Will continue to monitor for progress.     Expected Outcomes  Patient will continue to attend sessions and complete the program meeting his personal goals.   Patient will continue to attend sessions and complete the program meeting his personal goals.   Patient will continue to attend sessions and complete the program meeting his personal goals.         Core Components/Risk Factors/Patient Goals at Discharge (Final Review):  Goals and Risk Factor Review - 06/15/19 1540      Core  Components/Risk Factors/Patient Goals Review   Personal Goals Review  Weight Management/Obesity   Lose 50 lbs long term/get heart healthy; keep weight off.   Review  Cardiac Rehab closed 02/09/19 and reopened 05/25/19. Patient returned to the program gaining 20 lbs during the closure. He blames his weight gain on inactivity. Patient was sent for COVID-19 testing 06/05/19 due to respiratory symptoms. Test was negative but he can not return until he is symptom free per our Medical directors adivse. Patient has a televisit 06/12/19 with his PCP and was diagnosed with acute bronchitis and COPD exacerbation. He prescribed Prednisone, Levaquin, and Tessalon. Patient was told to call 06/25/19 which is 14 days after his test to see if he is symptom free. Will continue to monitor for progress.    Expected Outcomes  Patient will continue to attend sessions and complete the program meeting his personal goals.        ITP Comments: ITP Comments    Row Name 02/13/19 1311           ITP Comments  Cardiac Rehab services have been suspended due to the COVID-19 restrictions starting 02/09/2019. Patient will restart the progam when restrictions have been lifted.          Comments: ITP REVIEW Patient is doing well in the program. Will continue to monitor for progress.

## 2019-06-17 ENCOUNTER — Other Ambulatory Visit: Payer: Self-pay | Admitting: Cardiovascular Disease

## 2019-06-17 ENCOUNTER — Encounter (HOSPITAL_COMMUNITY): Payer: BC Managed Care – PPO

## 2019-06-19 ENCOUNTER — Encounter (HOSPITAL_COMMUNITY): Payer: BC Managed Care – PPO

## 2019-06-22 ENCOUNTER — Encounter (HOSPITAL_COMMUNITY): Payer: BC Managed Care – PPO

## 2019-06-23 ENCOUNTER — Other Ambulatory Visit: Payer: BC Managed Care – PPO

## 2019-06-23 ENCOUNTER — Other Ambulatory Visit: Payer: Self-pay

## 2019-06-23 DIAGNOSIS — Z20822 Contact with and (suspected) exposure to covid-19: Secondary | ICD-10-CM

## 2019-06-23 DIAGNOSIS — R6889 Other general symptoms and signs: Secondary | ICD-10-CM | POA: Diagnosis not present

## 2019-06-24 ENCOUNTER — Ambulatory Visit (INDEPENDENT_AMBULATORY_CARE_PROVIDER_SITE_OTHER): Payer: BC Managed Care – PPO | Admitting: Osteopathic Medicine

## 2019-06-24 ENCOUNTER — Telehealth (HOSPITAL_COMMUNITY): Payer: Self-pay | Admitting: *Deleted

## 2019-06-24 ENCOUNTER — Encounter: Payer: Self-pay | Admitting: Osteopathic Medicine

## 2019-06-24 ENCOUNTER — Encounter (HOSPITAL_COMMUNITY): Payer: BC Managed Care – PPO

## 2019-06-24 DIAGNOSIS — J209 Acute bronchitis, unspecified: Secondary | ICD-10-CM

## 2019-06-24 DIAGNOSIS — J449 Chronic obstructive pulmonary disease, unspecified: Secondary | ICD-10-CM

## 2019-06-24 DIAGNOSIS — J440 Chronic obstructive pulmonary disease with acute lower respiratory infection: Secondary | ICD-10-CM | POA: Diagnosis not present

## 2019-06-24 MED ORDER — ALBUTEROL SULFATE HFA 108 (90 BASE) MCG/ACT IN AERS: 2.0000 | INHALATION_SPRAY | Freq: Two times a day (BID) | RESPIRATORY_TRACT | 99 refills | Status: DC

## 2019-06-24 MED ORDER — LEVOFLOXACIN 500 MG PO TABS: 500.0000 mg | ORAL_TABLET | Freq: Every day | ORAL | 0 refills | Status: DC

## 2019-06-24 MED ORDER — TRELEGY ELLIPTA 100-62.5-25 MCG/INH IN AEPB: 1.0000 | INHALATION_SPRAY | Freq: Every day | RESPIRATORY_TRACT | 0 refills | Status: DC

## 2019-06-24 MED ORDER — PREDNISONE 20 MG PO TABS: 20.0000 mg | ORAL_TABLET | Freq: Two times a day (BID) | ORAL | 0 refills | Status: DC

## 2019-06-24 MED ORDER — ALBUTEROL SULFATE (5 MG/ML) 0.5% IN NEBU: 2.5000 mg | INHALATION_SOLUTION | RESPIRATORY_TRACT | 99 refills | Status: DC | PRN

## 2019-06-24 NOTE — Progress Notes (Signed)
Virtual Visit via Video (App used: Doximity) Note  I connected with      Jeffrey Larson on 06/24/19 at 1:46 PM  by a telemedicine application and verified that I am speaking with the correct person using two identifiers.  Patient is at home I am in office    I discussed the limitations of evaluation and management by telemedicine and the availability of in person appointments. The patient expressed understanding and agreed to proceed.  History of Present Illness: Jeffrey Larson is a 63 y.o. male who would like to discuss COPD   Known COPD. 02/2018 visit w/ Pulmonology, Was advised continue Trelegy, albuterol, RTC for pulmonary function testing.  Treated w/ Levaquin and prednisone 06/12/19 for COPD exacerbation.  States he was feeling a lot better shortly thereafter, but chest tightness seems to be bothering him again.  He just feels congested, there is some cough, denies shortness of breath.    Observations/Objective: There were no vitals taken for this visit. BP Readings from Last 3 Encounters:  04/27/19 (!) 148/76  02/04/19 114/72  01/20/19 140/76   Exam: Normal Speech.  NAD Speaking in full sentences.  Lab and Radiology Results No results found for this or any previous visit (from the past 72 hour(s)). No results found.     Assessment and Plan: 63 y.o. male with The primary encounter diagnosis was Acute bronchitis with COPD ([place]). A diagnosis of COPD with chronic bronchitis ([place]) was also pertinent to this visit.   PDMP not reviewed this encounter. No orders of the defined types were placed in this encounter.  Meds ordered this encounter  Medications  . Fluticasone-Umeclidin-Vilant (TRELEGY ELLIPTA) 100-62.5-25 MCG/INH AEPB    Sig: Inhale 1 puff into the lungs daily. NEEDS TO SCHEDULE OFFICE VISIT    Dispense:  60 each    Refill:  0  . levofloxacin (LEVAQUIN) 500 MG tablet    Sig: Take 1 tablet (500 mg total) by mouth daily.    Dispense:  7 tablet   Refill:  0  . predniSONE (DELTASONE) 20 MG tablet    Sig: Take 1 tablet (20 mg total) by mouth 2 (two) times daily with a meal.    Dispense:  10 tablet    Refill:  0  . albuterol (VENTOLIN HFA) 108 (90 Base) MCG/ACT inhaler    Sig: Inhale 2 puffs into the lungs 2 (two) times daily.    Dispense:  18 g    Refill:  99  . albuterol (PROVENTIL) (5 MG/ML) 0.5% nebulizer solution    Sig: Take 0.5 mLs (2.5 mg total) by nebulization every 2 (two) hours as needed for wheezing or shortness of breath.    Dispense:  40 mL    Refill:  99   Patient Instructions  Plan: We will repeat round of antibiotics plus steroids.  Will refill inhalers and nebulizer medication.  If these measures are not helping, or if you get worse or symptoms change, please let me know or seek emergency medical attention if needed.  If chest tightness feeling persists but does not get worse or really changed with medicines, I would definitely want you to follow-up with pulmonology to see if they might recommend putting you on chronic steroid therapy, or otherwise changing our management of the COPD.    Instructions sent via MyChart. If MyChart not available, pt was given option for info via personal e-mail w/ no guarantee of protected health info over unsecured e-mail communication, and MyChart sign-up instructions  were included.   Follow Up Instructions: Return if COPD symptoms worsen or fail to improve.    I discussed the assessment and treatment plan with the patient. The patient was provided an opportunity to ask questions and all were answered. The patient agreed with the plan and demonstrated an understanding of the instructions.   The patient was advised to call back or seek an in-person evaluation if any new concerns, if symptoms worsen or if the condition fails to improve as anticipated.  25 minutes of non-face-to-face time was provided during this encounter.                      Historical  information moved to improve visibility of documentation.  Past Medical History:  Diagnosis Date  . Chronic combined systolic and diastolic congestive heart failure ([place])   . COPD with chronic bronchitis ([place])   . Coronary artery disease   . Essential hypertension, benign    Ramipril to losartan Sept 2015   . History of pneumonia    RML on CXR 07/20/14  . Hyperlipidemia   . Longstanding persistent atrial fibrillation   . Mitral stenosis with regurgitation   . Myocardial infarction ([place])   . Pulmonary hypertension ([place])   . Renal disorder    history kidney stone  . S/P Maze operation for atrial fibrillation 07/18/2018   Complete bilateral atrial lesion set using bipolar radiofrequency and cryothermy ablation with clipping of LA appendage  . S/P mitral valve replacement with metallic valve 2/95/2841   Sorin Carbomedics Optiform bileaflet mechanical valve, size 33 mm  . S/P TVR (tricuspid valve repair) 07/18/2018   Edwards mc3 ring annuloplasty, size 30 mm   Past Surgical History:  Procedure Laterality Date  . APPENDECTOMY    . CARDIOVASCULAR STRESS TEST  03/2014   Borderline reversible ischemic changes at the apex.  Normal LV contractility/EF 52%.  . CORONARY STENT PLACEMENT  7/12013  . LEFT HEART CATHETERIZATION WITH CORONARY ANGIOGRAM N/A 06/12/2012   Procedure: LEFT HEART CATHETERIZATION WITH CORONARY ANGIOGRAM;  Surgeon: Clent Demark, MD;  Location[facility] CATH LAB;  Service: Cardiovascular;  Laterality: N/A;  . MAZE N/A 07/18/2018   Procedure: MAZE;  Surgeon: Rexene Alberts, MD;  Location: [place];  Service: Open Heart Surgery;  Laterality: N/A;  . MITRAL VALVE REPLACEMENT N/A 07/18/2018   Procedure: MITRAL VALVE (MV) REPLACEMENT WITH CARBOMEDICS OPTIFORM MITRAL VALVE SIZE 33.;  Surgeon: Rexene Alberts, MD;  Location: [place];  Service: Open Heart Surgery;  Laterality: N/A;  . PERCUTANEOUS CORONARY STENT INTERVENTION (PCI-S) N/A 06/17/2012   Procedure: PERCUTANEOUS CORONARY STENT  INTERVENTION (PCI-S);  Surgeon: Clent Demark, MD;  Location: [facility] CATH LAB;  Service: Cardiovascular;  Laterality: N/A;  . RIGHT/LEFT HEART CATH AND CORONARY ANGIOGRAPHY N/A 06/10/2018   Procedure: RIGHT/LEFT HEART CATH AND CORONARY ANGIOGRAPHY;  Surgeon: Dixie Dials, MD;  Location: [place] CV LAB;  Service: Cardiovascular;  Laterality: N/A;  . TEE WITHOUT CARDIOVERSION N/A 06/10/2018   Procedure: TRANSESOPHAGEAL ECHOCARDIOGRAM (TEE) with bubble study;  Surgeon: Dixie Dials, MD;  Location: [facility] ENDOSCOPY;  Service: Cardiovascular;  Laterality: N/A;  . TEE WITHOUT CARDIOVERSION N/A 07/18/2018   Procedure: TRANSESOPHAGEAL ECHOCARDIOGRAM (TEE);  Surgeon: Rexene Alberts, MD;  Location: [place];  Service: Open Heart Surgery;  Laterality: N/A;  . TRICUSPID VALVE REPLACEMENT N/A 07/18/2018   Procedure: TRICUSPID VALVE REPAIR WITH EDWARDS MC3 TRICUSPID ANNULOPLASTY RING SIZE 30.;  Surgeon: Rexene Alberts, MD;  Location: [place];  Service:  Open Heart Surgery;  Laterality: N/A;   Social History   Tobacco Use  . Smoking status: Former Smoker    Packs/day: 2.00    Years: 15.00    Pack years: 30.00    Types: Cigarettes    Quit date: 11/26/1996    Years since quitting: 22.5  . Smokeless tobacco: Never Used  Substance Use Topics  . Alcohol use: No   family history includes Heart disease in his father and mother.  Medications: Current Outpatient Medications  Medication Sig Dispense Refill  . acetaminophen (TYLENOL) 500 MG tablet Take 1,000 mg by mouth 2 (two) times daily.    Marland Kitchen albuterol (PROVENTIL) (5 MG/ML) 0.5% nebulizer solution Take 0.5 mLs (2.5 mg total) by nebulization every 2 (two) hours as needed for wheezing or shortness of breath. 40 mL 99  . albuterol (VENTOLIN HFA) 108 (90 Base) MCG/ACT inhaler Inhale 2 puffs into the lungs 2 (two) times daily. 18 g 99  . AMBULATORY NON FORMULARY MEDICATION Medication Name: Glucometer, lancets and strip to test every other day. Dx: Diabetes type 2,  E11.9. 100 strip and 100 lancets 1 Units PRN  . amoxicillin (AMOXIL) 500 MG capsule Take 4 capsules by mouth 30-60 minutes prior to dental procedure. (Patient not taking: Reported on 06/12/2019) 4 capsule 3  . aspirin 81 MG tablet Take 81 mg by mouth daily.    Marland Kitchen atorvastatin (LIPITOR) 40 MG tablet Take 1 tablet (40 mg total) by mouth every evening. 90 tablet 3  . benzonatate (TESSALON) 200 MG capsule Take 1 capsule (200 mg total) by mouth 3 (three) times daily as needed for cough. 30 capsule 0  . Fluticasone-Umeclidin-Vilant (TRELEGY ELLIPTA) 100-62.5-25 MCG/INH AEPB Inhale 1 puff into the lungs daily. NEEDS TO SCHEDULE OFFICE VISIT 60 each 0  . levofloxacin (LEVAQUIN) 500 MG tablet Take 1 tablet (500 mg total) by mouth daily. 7 tablet 0  . metoprolol succinate (TOPROL-XL) 50 MG 24 hr tablet Take 1 tablet (50 mg total) by mouth daily. Take with or immediately following a meal. 30 tablet 3  . nitroGLYCERIN (NITROSTAT) 0.4 MG SL tablet Place 1 tablet (0.4 mg total) under the tongue every 5 (five) minutes x 3 doses as needed for chest pain. 25 tablet 3  . Omega-3 Fatty Acids (FISH OIL) 1200 MG CAPS Take 1,200 mg by mouth daily.    Marland Kitchen oxymetazoline (AFRIN) 0.05 % nasal spray Place 1 spray into both nostrils daily.    . predniSONE (DELTASONE) 20 MG tablet Take 1 tablet (20 mg total) by mouth 2 (two) times daily with a meal. 10 tablet 0  . sacubitril-valsartan (ENTRESTO) 24-26 MG Take 1 tablet by mouth 2 (two) times daily. 180 tablet 1  . sildenafil (REVATIO) 20 MG tablet Take 1 tablet (20 mg total) by mouth 3 (three) times daily. 270 tablet 3  . spironolactone (ALDACTONE) 25 MG tablet TAKE 1 TABLET(25 MG) BY MOUTH DAILY 30 tablet 6  . torsemide (DEMADEX) 20 MG tablet Take 2 tablets (40 mg total) by mouth daily. 60 tablet 11  . warfarin (COUMADIN) 4 MG tablet Take 1 tablet daily except 1 1/2 tablets on Mondays and Thursdays or as directed 40 tablet 4   No current facility-administered medications for this  visit.    Allergies  Allergen Reactions  . Ramipril Cough    cough  . Percocet [Oxycodone-Acetaminophen]     In ICU it contributed to hallucinations. Can take by itself    PDMP not reviewed this encounter. No orders of  the defined types were placed in this encounter.  Meds ordered this encounter  Medications  . Fluticasone-Umeclidin-Vilant (TRELEGY ELLIPTA) 100-62.5-25 MCG/INH AEPB    Sig: Inhale 1 puff into the lungs daily. NEEDS TO SCHEDULE OFFICE VISIT    Dispense:  60 each    Refill:  0  . levofloxacin (LEVAQUIN) 500 MG tablet    Sig: Take 1 tablet (500 mg total) by mouth daily.    Dispense:  7 tablet    Refill:  0  . predniSONE (DELTASONE) 20 MG tablet    Sig: Take 1 tablet (20 mg total) by mouth 2 (two) times daily with a meal.    Dispense:  10 tablet    Refill:  0  . albuterol (VENTOLIN HFA) 108 (90 Base) MCG/ACT inhaler    Sig: Inhale 2 puffs into the lungs 2 (two) times daily.    Dispense:  18 g    Refill:  99  . albuterol (PROVENTIL) (5 MG/ML) 0.5% nebulizer solution    Sig: Take 0.5 mLs (2.5 mg total) by nebulization every 2 (two) hours as needed for wheezing or shortness of breath.    Dispense:  40 mL    Refill:  99

## 2019-06-24 NOTE — Patient Instructions (Signed)
Plan: We will repeat round of antibiotics plus steroids.  Will refill inhalers and nebulizer medication.  If these measures are not helping, or if you get worse or symptoms change, please let me know or seek emergency medical attention if needed.  If chest tightness feeling persists but does not get worse or really changed with medicines, I would definitely want you to follow-up with pulmonology to see if they might recommend putting you on chronic steroid therapy, or otherwise changing our management of the COPD.

## 2019-06-24 NOTE — Telephone Encounter (Signed)
Called patient to discuss how he is feeling ater being tested for covid-19. He shared he was tested again on 06/23/19 due to possible exposure at church. He also shared that his wife did test positive with symptoms. I let him know our policy for his return to cardiac rehab. Patient expressed an understanding and will call us when everyone is symptom free and has tested negative.

## 2019-06-25 LAB — NOVEL CORONAVIRUS, NAA: SARS-CoV-2, NAA: NOT DETECTED

## 2019-06-26 ENCOUNTER — Encounter (HOSPITAL_COMMUNITY): Payer: BC Managed Care – PPO

## 2019-06-29 ENCOUNTER — Encounter (HOSPITAL_COMMUNITY): Payer: BC Managed Care – PPO

## 2019-07-01 ENCOUNTER — Encounter (HOSPITAL_COMMUNITY): Payer: BC Managed Care – PPO

## 2019-07-03 ENCOUNTER — Encounter (HOSPITAL_COMMUNITY): Payer: BC Managed Care – PPO

## 2019-07-06 ENCOUNTER — Other Ambulatory Visit: Payer: Self-pay

## 2019-07-06 ENCOUNTER — Encounter (HOSPITAL_COMMUNITY): Payer: BC Managed Care – PPO

## 2019-07-06 ENCOUNTER — Telehealth: Payer: Self-pay

## 2019-07-06 DIAGNOSIS — Z20822 Contact with and (suspected) exposure to covid-19: Secondary | ICD-10-CM

## 2019-07-06 NOTE — Telephone Encounter (Signed)
Pt left a vm msg stating that his wife has been submitted last week into the hospital for COVID 19. As per pt, continues to have similar symptoms of COVID 19 even though his test was negative. He is having diarrhea, fever and O/2 is 92%-94%. Pt has been taking ibuprofen every 4 hrs to keep fever down. Requesting recommendation from provider. Spoke with provider -since ptt is still unwell, he should report to nearest ER. Attempted to contact pt, call went straight to vm. Left a detailed vm msg for pt regarding provider's note. Direct contact info provided.

## 2019-07-07 LAB — NOVEL CORONAVIRUS, NAA: SARS-CoV-2, NAA: DETECTED — AB

## 2019-07-07 NOTE — Progress Notes (Signed)
Cardiac Individual Treatment Plan  Patient Details  Name: Jeffrey Larson MRN: [id number] Date of Birth: [birth year] Referring Provider:     CARDIAC REHAB [place] from 01/14/2019 in [place]  Referring Provider  [place]      Initial Encounter Date:    CARDIAC REHAB PHASE II ORIENTATION from 01/14/2019 in [place]  Date  01/14/19      Visit Diagnosis: S/P mitral valve replacement   S/P tricuspid valve repair   Patient's Home Medications on Admission:  Current Outpatient Medications:  .  acetaminophen (TYLENOL) 500 MG tablet, Take 1,000 mg by mouth 2 (two) times daily., Disp: , Rfl:  .  albuterol (PROVENTIL) (5 MG/ML) 0.5% nebulizer solution, Take 0.5 mLs (2.5 mg total) by nebulization every 2 (two) hours as needed for wheezing or shortness of breath., Disp: 40 mL, Rfl: 99 .  albuterol (VENTOLIN HFA) 108 (90 Base) MCG/ACT inhaler, Inhale 2 puffs into the lungs 2 (two) times daily., Disp: 18 g, Rfl: 99 .  AMBULATORY NON FORMULARY MEDICATION, Medication Name: Glucometer, lancets and strip to test every other day. Dx: Diabetes type 2, E11.9. 100 strip and 100 lancets, Disp: 1 Units, Rfl: PRN .  amoxicillin (AMOXIL) 500 MG capsule, Take 4 capsules by mouth 30-60 minutes prior to dental procedure. (Patient not taking: Reported on 06/12/2019), Disp: 4 capsule, Rfl: 3 .  aspirin 81 MG tablet, Take 81 mg by mouth daily., Disp: , Rfl:  .  atorvastatin (LIPITOR) 40 MG tablet, Take 1 tablet (40 mg total) by mouth every evening., Disp: 90 tablet, Rfl: 3 .  benzonatate (TESSALON) 200 MG capsule, Take 1 capsule (200 mg total) by mouth 3 (three) times daily as needed for cough., Disp: 30 capsule, Rfl: 0 .  Fluticasone-Umeclidin-Vilant (TRELEGY ELLIPTA) 100-62.5-25 MCG/INH AEPB, Inhale 1 puff into the lungs daily. NEEDS TO SCHEDULE OFFICE VISIT, Disp: 60 each, Rfl: 0 .  levofloxacin (LEVAQUIN) 500 MG tablet, Take 1 tablet (500 mg total) by  mouth daily., Disp: 7 tablet, Rfl: 0 .  metoprolol succinate (TOPROL-XL) 50 MG 24 hr tablet, Take 1 tablet (50 mg total) by mouth daily. Take with or immediately following a meal., Disp: 30 tablet, Rfl: 3 .  nitroGLYCERIN (NITROSTAT) 0.4 MG SL tablet, Place 1 tablet (0.4 mg total) under the tongue every 5 (five) minutes x 3 doses as needed for chest pain., Disp: 25 tablet, Rfl: 3 .  Omega-3 Fatty Acids (FISH OIL) 1200 MG CAPS, Take 1,200 mg by mouth daily., Disp: , Rfl:  .  oxymetazoline (AFRIN) 0.05 % nasal spray, Place 1 spray into both nostrils daily., Disp: , Rfl:  .  predniSONE (DELTASONE) 20 MG tablet, Take 1 tablet (20 mg total) by mouth 2 (two) times daily with a meal., Disp: 10 tablet, Rfl: 0 .  sacubitril-valsartan (ENTRESTO) 24-26 MG, Take 1 tablet by mouth 2 (two) times daily., Disp: 180 tablet, Rfl: 1 .  sildenafil (REVATIO) 20 MG tablet, Take 1 tablet (20 mg total) by mouth 3 (three) times daily., Disp: 270 tablet, Rfl: 3 .  spironolactone (ALDACTONE) 25 MG tablet, TAKE 1 TABLET(25 MG) BY MOUTH DAILY, Disp: 30 tablet, Rfl: 6 .  torsemide (DEMADEX) 20 MG tablet, Take 2 tablets (40 mg total) by mouth daily., Disp: 60 tablet, Rfl: 11 .  warfarin (COUMADIN) 4 MG tablet, Take 1 tablet daily except 1 1/2 tablets on Mondays and Thursdays or as directed, Disp: 40 tablet, Rfl: 4  Past Medical History: Past Medical History:  Diagnosis Date  . Chronic combined systolic and diastolic congestive heart failure ([place])   . COPD with chronic bronchitis ([place])   . Coronary artery disease   . Essential hypertension, benign    Ramipril to losartan Sept 2015   . History of pneumonia    RML on CXR 07/20/14  . Hyperlipidemia   . Longstanding persistent atrial fibrillation   . Mitral stenosis with regurgitation   . Myocardial infarction ([place])   . Pulmonary hypertension ([place])   . Renal disorder    history kidney stone  . S/P M[facility] operation for atrial fibrillation 07/18/2018   Complete bilateral atrial  lesion set using bipolar radiofrequency and cryothermy ablation with clipping of LA appendage  . S/P mitral valve replacement with metallic valve 05/21/9484   Sorin [employer]ics Optiform bileaflet mechanical valve, size 33 mm  . S/P TVR (tricuspid valve repair) 07/18/2018   [facility]wards mc3 ring annuloplasty, size 30 mm    Tobacco Use: Social History   Tobacco Use  Smoking Status Former Smoker  . Packs/day: 2.00  . Years: 15.00  . Pack years: 30.00  . Types: Cigarettes  . Quit date: 11/26/1996  . Years since quitting: 22.6  Smokeless Tobacco Never Used    Labs: Recent Review Flowsheet Data    Labs for ITP Cardiac and Pulmonary Rehab Latest Ref Rng & Units 07/21/2018 07/28/2018 07/29/2018 07/30/2018 12/08/2018   Cholestrol <200 mg/dL - - - - 139   LDLCALC mg/dL (calc) - - - - 77   HDL >40 mg/dL - - - - 33(L)   Trlycerides <150 mg/dL - - - - 194(H)   Hemoglobin A1c <5.7 % of total Hgb - - - - 5.6   PHART 7.350 - 7.450 - - - - -   PCO2ART 32.0 - 48.0 mmHg - - - - -   HCO3 20.0 - 28.0 mmol/L - - - - -   TCO2 22 - 32 mmol/L 27 - - - -   ACIDBASEDEF 0.0 - 2.0 mmol/L - - - - -   O2SAT % 68.5 58.5 58.3 59.1 -      Capillary Blood Glucose: Lab Results  Component Value Date   GLUCAP 114 (H) 07/25/2018   GLUCAP 101 (H) 07/25/2018   GLUCAP 102 (H) 07/25/2018   GLUCAP 147 (H) 07/24/2018   GLUCAP 87 07/24/2018     Exercise Target Goals: Exercise Program Goal: Individual exercise prescription set using results from initial 6 min walk test and THRR while considering  patient's activity barriers and safety.   Exercise Prescription Goal: Starting with aerobic activity 30 plus minutes a day, 3 days per week for initial exercise prescription. Provide home exercise prescription and guidelines that participant acknowledges understanding prior to discharge.  Activity Barriers & Risk Stratification: Activity Barriers & Cardiac Risk Stratification - 01/14/19 1348      Activity Barriers & Cardiac Risk  Stratification   Activity Barriers  None    Cardiac Risk Stratification  High       6 Minute Walk: 6 Minute Walk    Row Name 01/14/19 1346         6 Minute Walk   Phase  Initial     Distance  1300 feet     Walk Time  6 minutes     # of Rest Breaks  0     MPH  2.46     METS  2.88     RPE  9     Perceived Dyspnea  7     VO2 Peak  9.23     Symptoms  No     Resting HR  78 bpm     Resting BP  112/78     Resting Oxygen Saturation   97 %     Exercise Oxygen Saturation  during 6 min walk  97 %     Max Ex. HR  116 bpm     Max Ex. BP  128/74     2 Minute Post BP  114/76        Oxygen Initial Assessment:   Oxygen Re-Evaluation:   Oxygen Discharge (Final Oxygen Re-Evaluation):   Initial Exercise Prescription: Initial Exercise Prescription - 01/14/19 1300      Date of Initial Exercise RX and Referring Provider   Date  01/14/19    Referring Provider  Bensimhon    Expected Discharge Date  04/14/19      Treadmill   MPH  1.4    Grade  0    Minutes  17    METs  2.07      Recumbant Elliptical   Level  1    RPM  40    Watts  35    Minutes  17    METs  2      Prescription Details   Frequency (times per week)  3    Duration  Progress to 30 minutes of continuous aerobic without signs/symptoms of physical distress      Intensity   THRR 40-80% of Max Heartrate  [phone]    Ratings of Perceived Exertion  11-13    Perceived Dyspnea  0-4      Progression   Progression  Continue to progress workloads to maintain intensity without signs/symptoms of physical distress.      Resistance Training   Training Prescription  Yes    Weight  1    Reps  10-15       Perform Capillary Blood Glucose checks as needed.  Exercise Prescription Changes:  Exercise Prescription Changes    Row Name 01/14/19 1300 02/03/19 0700 02/16/19 1000 06/11/19 1200       Response to Exercise   Blood Pressure (Admit)  112/78  110/62  106/70  106/68    Blood Pressure (Exercise)  128/74   134/62  108/70  144/70    Blood Pressure (Exit)  114/76  104/68  110/62  110/68    Heart Rate (Admit)  78 bpm  83 bpm  85 bpm  73 bpm    Heart Rate (Exercise)  116 bpm  118 bpm  115 bpm  115 bpm    Heart Rate (Exit)  99 bpm  116 bpm  117 bpm  105 bpm    Oxygen Saturation (Admit)  97 %  -  -  -    Oxygen Saturation (Exercise)  97 %  -  -  -    Oxygen Saturation (Exit)  98 %  -  -  -    Rating of Perceived Exertion (Exercise)  9  11  11  12     Perceived Dyspnea (Exercise)  7  -  -  -    Comments  6 minute walk test   first two weeks of exercose   -  back after COVID    Duration  Progress to 30 minutes of  aerobic without signs/symptoms of physical distress  Continue with 30 min of aerobic exercise without signs/symptoms of physical distress.  Continue with 30 min  of aerobic exercise without signs/symptoms of physical distress.  Continue with 30 min of aerobic exercise without signs/symptoms of physical distress.    Intensity  THRR New 110-126-142  THRR unchanged  THRR unchanged  THRR unchanged      Progression   Progression  -  Continue to progress workloads to maintain intensity without signs/symptoms of physical distress.  Continue to progress workloads to maintain intensity without signs/symptoms of physical distress.  Continue to progress workloads to maintain intensity without signs/symptoms of physical distress.    Average METs  -  3.66  3.45  3.25      Resistance Training   Training Prescription  -  Yes  Yes  Yes    Weight  -  2  2  3     Reps  -  10-15  10-15  10-15      Treadmill   MPH  -  1.6  1.9  2.2    Grade  -  0  0  0    Minutes  -  17  17  17     METs  -  2.22  2.45  2.68      NuStep   Level  -  -  -  2    SPM  -  -  -  137    Minutes  -  -  -  22    METs  -  -  -  3.82      Recumbant Elliptical   Level  -  1  1  -    RPM  -  63  58  -    Watts  -  90  80  -    Minutes  -  22  22  -    METs  -  5.1  4.4  -      Home Exercise Plan   Plans to continue exercise at   Home (comment)  Home (comment)  Home (comment)  Home (comment)    Frequency  Add 2 additional days to program exercise sessions.  Add 2 additional days to program exercise sessions.  Add 2 additional days to program exercise sessions.  Add 2 additional days to program exercise sessions.    Initial Home Exercises Provided  01/14/19  01/14/19  01/14/19  01/14/19       Exercise Comments:  Exercise Comments    Row Name 01/27/19 0800 02/16/19 1010 06/11/19 1240 06/15/19 1344     Exercise Comments  Pt. is new to CR, he has attended 5 sessions. He is walking on the TM and riding the XR. Both machines he has tolerated well and is pushing himself to grow stronger on. We will begin to increase his workloads as tolerated.   Pt. has attended 10 sessions so far. He continues to tolerate the exercise well on both machines and is up to 1.9 MPH on the TM.   Pt. returned after being out due to [place] 19. He remained active at home so was able to come back to CR and pick up right where he left off. We will continue to monitor and progress him as needed.  Pt. has tolerated the exercise well since returning. He is out right now due to sickness but we will adjust his prescription as needed when he is able to come back.       Exercise Goals and Review:  Exercise Goals    Row Name 01/14/19 1352  Exercise Goals   Increase Physical Activity  Yes       Intervention  Develop an individualized exercise prescription for aerobic and resistive training based on initial evaluation findings, risk stratification, comorbidities and participant's personal goals.;Provide advice, education, support and counseling about physical activity/exercise needs.       Expected Outcomes  Short Term: Attend rehab on a regular basis to increase amount of physical activity.;Long Term: Add in home exercise to make exercise part of routine and to increase amount of physical activity.;Long Term: Exercising regularly at least 3-5 days  a week.       Increase Strength and Stamina  Yes       Intervention  Provide advice, education, support and counseling about physical activity/exercise needs.;Develop an individualized exercise prescription for aerobic and resistive training based on initial evaluation findings, risk stratification, comorbidities and participant's personal goals.       Expected Outcomes  Short Term: Increase workloads from initial exercise prescription for resistance, speed, and METs.;Short Term: Perform resistance training exercises routinely during rehab and add in resistance training at home;Long Term: Improve cardiorespiratory fitness, muscular endurance and strength as measured by increased METs and functional capacity (6MWT)       Able to understand and use rate of perceived exertion (RPE) scale  Yes       Intervention  Provide education and explanation on how to use RPE scale       Expected Outcomes  Short Term: Able to use RPE daily in rehab to express subjective intensity level;Long Term:  Able to use RPE to guide intensity level when exercising independently       Able to understand and use Dyspnea scale  Yes       Intervention  Provide education and explanation on how to use Dyspnea scale       Expected Outcomes  Short Term: Able to use Dyspnea scale daily in rehab to express subjective sense of shortness of breath during exertion;Long Term: Able to use Dyspnea scale to guide intensity level when exercising independently       Knowledge and understanding of Target Heart Rate Range (THRR)  Yes       Intervention  Provide education and explanation of THRR including how the numbers were predicted and where they are located for reference       Expected Outcomes  Short Term: Able to state/look up THRR       Able to check pulse independently  Yes       Intervention  Provide education and demonstration on how to check pulse in carotid and radial arteries.;Review the importance of being able to check your own pulse  for safety during independent exercise       Expected Outcomes  Short Term: Able to explain why pulse checking is important during independent exercise;Long Term: Able to check pulse independently and accurately       Understanding of Exercise Prescription  Yes       Intervention  Provide education, explanation, and written materials on patient's individual exercise prescription       Expected Outcomes  Short Term: Able to explain program exercise prescription;Long Term: Able to explain home exercise prescription to exercise independently          Exercise Goals Re-Evaluation : Exercise Goals Re-Evaluation    Row Name 01/27/19 0758 02/16/19 1009 06/15/19 1342         Exercise Goal Re-Evaluation   Exercise Goals Review  Increase Physical Activity;Increase Strength and  Stamina;Able to understand and use rate of perceived exertion (RPE) scale;Knowledge and understanding of Target Heart Rate Range (THRR);Able to check pulse independently;Understanding of Exercise Prescription  Increase Physical Activity;Increase Strength and Stamina;Able to understand and use rate of perceived exertion (RPE) scale;Knowledge and understanding of Target Heart Rate Range (THRR);Able to check pulse independently;Understanding of Exercise Prescription  Increase Physical Activity;Increase Strength and Stamina;Able to understand and use rate of perceived exertion (RPE) scale;Knowledge and understanding of Target Heart Rate Range (THRR);Able to check pulse independently;Understanding of Exercise Prescription     Comments  Pt. is new to the program, he has attended 5 sessions so far. He has tolerated the exercise well. We will begin in increase his workloads as tolerated.   Pt. is still fairly new to CR. He has now attended 10 sessions. We have progressed him to 1.9 MPH on the TM and he has tolerated it well.   Pt. was doing well after returning to Cardiac Rehab. He has been out for 2 weeks due to being sick. When he returns we  will reevaluate his exercise prescription based on what he can tolerate.     Expected Outcomes  lose weight and develop heart healthy habits.   lose weight and develop heart healthy habits.   Short: get back in to a regular exercise routine. Long: lose weight and keep it off.         Discharge Exercise Prescription (Final Exercise Prescription Changes): Exercise Prescription Changes - 06/11/19 1200      Response to Exercise   Blood Pressure (Admit)  106/68    Blood Pressure (Exercise)  144/70    Blood Pressure (Exit)  110/68    Heart Rate (Admit)  73 bpm    Heart Rate (Exercise)  115 bpm    Heart Rate (Exit)  105 bpm    Rating of Perceived Exertion (Exercise)  12    Comments  back after COVID    Duration  Continue with 30 min of aerobic exercise without signs/symptoms of physical distress.    Intensity  THRR unchanged      Progression   Progression  Continue to progress workloads to maintain intensity without signs/symptoms of physical distress.    Average METs  3.25      Resistance Training   Training Prescription  Yes    Weight  3    Reps  10-15      Treadmill   MPH  2.2    Grade  0    Minutes  17    METs  2.68      NuStep   Level  2    SPM  137    Minutes  22    METs  3.82      Home Exercise Plan   Plans to continue exercise at  Home (comment)    Frequency  Add 2 additional days to program exercise sessions.    Initial Home Exercises Provided  01/14/19       Nutrition:  Target Goals: Understanding of nutrition guidelines, daily intake of sodium 1500mg , cholesterol 200mg , calories 30% from fat and 7% or less from saturated fats, daily to have 5 or more servings of fruits and vegetables.  Biometrics: Pre Biometrics - 01/14/19 1352      Pre Biometrics   Height  5\' 11"  (1.803 m)    Waist Circumference  55 inches    Hip Circumference  46 inches    Waist to Hip Ratio  1.2 %  Triceps Skinfold  15 mm    % Body Fat  39.1 %    Grip Strength  14.8 kg     Single Leg Stand  60 seconds        Nutrition Therapy Plan and Nutrition Goals: Nutrition Therapy & Goals - 06/15/19 1540      Nutrition Therapy   RD appointment deferred  Yes      Intervention Plan   Intervention  Nutrition handout(s) given to patient.       Nutrition Assessments: Nutrition Assessments - 01/14/19 1447      MEDFICTS Scores   Pre Score  108       Nutrition Goals Re-Evaluation:   Nutrition Goals Discharge (Final Nutrition Goals Re-Evaluation):   Psychosocial: Target Goals: Acknowledge presence or absence of significant depression and/or stress, maximize coping skills, provide positive support system. Participant is able to verbalize types and ability to use techniques and skills needed for reducing stress and depression.  Initial Review & Psychosocial Screening: Initial Psych Review & Screening - 01/14/19 1445      Initial Review   Current issues with  None Identified      Family Dynamics   Good Support System?  Yes      Barriers   Psychosocial barriers to participate in program  There are no identifiable barriers or psychosocial needs.      Screening Interventions   Interventions  Encouraged to exercise    Expected Outcomes  Short Term goal: Identification and review with participant of any Quality of Life or Depression concerns found by scoring the questionnaire.;Long Term goal: The participant improves quality of Life and PHQ9 Scores as seen by post scores and/or verbalization of changes       Quality of Life Scores: Quality of Life - 01/14/19 1354      Quality of Life   Select  Quality of Life      Quality of Life Scores   Health/Function Pre  29.6 %    Socioeconomic Pre  28.93 %    Psych/Spiritual Pre  28.29 %    Family Pre  28.8 %    GLOBAL Pre  29.07 %      Scores of 19 and below usually indicate a poorer quality of life in these areas.  A difference of  2-3 points is a clinically meaningful difference.  A difference of 2-3 points  in the total score of the Quality of Life Index has been associated with significant improvement in overall quality of life, self-image, physical symptoms, and general health in studies assessing change in quality of life.  PHQ-9: Recent Review Flowsheet Data    Depression screen [facility] 2/9 01/14/2019 08/02/2017   Decreased Interest 0 0   Down, Depressed, Hopeless 0 0   PHQ - 2 Score 0 0   Altered sleeping 0 -   Tired, decreased energy 0 -   Change in appetite 0 -   Feeling bad or failure about yourself  0 -   Trouble concentrating 0 -   Moving slowly or fidgety/restless 0 -   Suicidal thoughts 0 -   PHQ-9 Score 0 -   Difficult doing work/chores Not difficult at all -     Interpretation of Total Score  Total Score Depression Severity:  1-4 = Minimal depression, 5-9 = Mild depression, 10-14 = Moderate depression, 15-19 = Moderately severe depression, 20-27 = Severe depression   Psychosocial Evaluation and Intervention: Psychosocial Evaluation - 01/14/19 1445  Psychosocial Evaluation & Interventions   Interventions  Encouraged to exercise with the program and follow exercise prescription    Continue Psychosocial Services   No Follow up required       Psychosocial Re-Evaluation: Psychosocial Re-Evaluation    [place] Name 01/28/19 1509 02/16/19 1119 06/15/19 1540         Psychosocial Re-Evaluation   Current issues with  None Identified  None Identified  None Identified     Comments  Patient's initial QOL score was 29.07 and his PHQ-9 score was 0 with no psychosocial issues identified.   Patient's initial QOL score was 29.07 and his PHQ-9 score was 0 with no psychosocial issues identified.   Patient's initial QOL score was 29.07 and his PHQ-9 score was 0 with no psychosocial issues identified.      Expected Outcomes  Patient will have no psychosocial issues identified at discharge.   Patient will have no psychosocial issues identified at discharge.   Patient will have no psychosocial  issues identified at discharge.      Interventions  Stress management education;Encouraged to attend Cardiac Rehabilitation for the exercise;Relaxation education  Stress management education;Encouraged to attend Cardiac Rehabilitation for the exercise;Relaxation education  Stress management education;Encouraged to attend Cardiac Rehabilitation for the exercise;Relaxation education     Continue Psychosocial Services   No Follow up required  No Follow up required  No Follow up required        Psychosocial Discharge (Final Psychosocial Re-Evaluation): Psychosocial Re-Evaluation - 06/15/19 1540      Psychosocial Re-Evaluation   Current issues with  None Identified    Comments  Patient's initial QOL score was 29.07 and his PHQ-9 score was 0 with no psychosocial issues identified.     Expected Outcomes  Patient will have no psychosocial issues identified at discharge.     Interventions  Stress management education;Encouraged to attend Cardiac Rehabilitation for the exercise;Relaxation education    Continue Psychosocial Services   No Follow up required       Vocational Rehabilitation: Provide vocational rehab assistance to qualifying candidates.   Vocational Rehab Evaluation & Intervention: Vocational Rehab - 01/14/19 1448      Initial Vocational Rehab Evaluation & Intervention   Assessment shows need for Vocational Rehabilitation  No       Education: Education Goals: Education classes will be provided on a weekly basis, covering required topics. Participant will state understanding/return demonstration of topics presented.  Learning Barriers/Preferences: Learning Barriers/Preferences - 01/14/19 1447      Learning Barriers/Preferences   Learning Barriers  None    Learning Preferences  Audio;Group Instruction;Skilled Demonstration;Individual Instruction       Education Topics: Hypertension, Hypertension Reduction -Define heart disease and high blood pressure. Discus how high blood  pressure affects the body and ways to reduce high blood pressure.   CARDIAC REHAB PHASE II EXERCISE from 06/03/2019 in [place]  Date  05/27/19  Educator  DC  Instruction Review Code  2- Demonstrated Understanding      Exercise and Your Heart -Discuss why it is important to exercise, the FITT principles of exercise, normal and abnormal responses to exercise, and how to exercise safely.   CARDIAC REHAB PHASE II EXERCISE from 06/03/2019 in [place]  Date  06/03/19  Educator  D. Coad  Instruction Review Code  2- Demonstrated Understanding      Angina -Discuss definition of angina, causes of angina, treatment of angina, and how to decrease risk of having angina.  Cardiac Medications -Review what the following cardiac medications are used for, how they affect the body, and side effects that may occur when taking the medications.  Medications include Aspirin, Beta blockers, calcium channel blockers, ACE Inhibitors, angiotensin receptor blockers, diuretics, digoxin, and antihyperlipidemics.   CARDIAC REHAB PHASE II EXERCISE from 06/03/2019 in [place] [place]  Date  01/21/19  Educator  Wynetta Emery  Instruction Review Code  2- Demonstrated Understanding      Congestive Heart Failure -Discuss the definition of CHF, how to live with CHF, the signs and symptoms of CHF, and how keep track of weight and sodium intake.   CARDIAC REHAB PHASE II EXERCISE from 06/03/2019 in [place]  Date  01/28/19  Educator  Orlinda Blalock  Instruction Review Code  2- Demonstrated Understanding      Heart Disease and Intimacy -Discus the effect sexual activity has on the heart, how changes occur during intimacy as we age, and safety during sexual activity.   CARDIAC REHAB PHASE II EXERCISE from 06/03/2019 in [place]  Date  02/04/19  Educator  Wynetta Emery  Instruction Review Code  2- Demonstrated Understanding       Smoking Cessation / COPD -Discuss different methods to quit smoking, the health benefits of quitting smoking, and the definition of COPD.   Nutrition I: Fats -Discuss the types of cholesterol, what cholesterol does to the heart, and how cholesterol levels can be controlled.   Nutrition II: Labels -Discuss the different components of food labels and how to read food label   Heart Parts/Heart Disease and PAD -Discuss the anatomy of the heart, the pathway of blood circulation through the heart, and these are affected by heart disease.   Stress I: Signs and Symptoms -Discuss the causes of stress, how stress may lead to anxiety and depression, and ways to limit stress.   Stress II: Relaxation -Discuss different types of relaxation techniques to limit stress.   Warning Signs of Stroke / TIA -Discuss definition of a stroke, what the signs and symptoms are of a stroke, and how to identify when someone is having stroke.   Knowledge Questionnaire Score: Knowledge Questionnaire Score - 01/14/19 1447      Knowledge Questionnaire Score   Pre Score  23/24       Core Components/Risk Factors/Patient Goals at Admission: Personal Goals and Risk Factors at Admission - 01/14/19 1448      Core Components/Risk Factors/Patient Goals on Admission    Weight Management  Yes    Admit Weight  295 lb 9.6 oz (134.1 kg)    Goal Weight: Short Term  285 lb 9.6 oz (129.5 kg)    Goal Weight: Long Term  275 lb 9.6 oz (125 kg)    Expected Outcomes  Short Term: Continue to assess and modify interventions until short term weight is achieved;Long Term: Adherence to nutrition and physical activity/exercise program aimed toward attainment of established weight goal    Personal Goal Other  Yes    Personal Goal  Lose 50lbs. and keep it off, gain heart health    Intervention  Attend program 3 x week and supplement with at home exercise 2 x week.     Expected Outcomes  Reach personal goals       Core  Components/Risk Factors/Patient Goals Review:  Goals and Risk Factor Review    Row Name 01/28/19 1507 02/16/19 1116 06/15/19 1540         Core Components/Risk Factors/Patient Goals Review  Personal Goals Review  Weight Management/Obesity Lose 50 lbs; get heart healthy.   Weight Management/Obesity Lose 50 lbs long term. Get heart healthy; keep weight off.   Weight Management/Obesity Lose 50 lbs long term/get heart healthy; keep weight off.     Review  Patient has completed 6 sessions gaining 2 lbs since his orientation visit. He says he has not attended enough to notice any progress. Will continue to monitor for progress.   Patient has completed 10 sessions gaining 7 lbs since last 30 day review. He admits to not taking his diuretic as prescribed especially on weekends due to side effects of frequent urination. He was encouraged to comply with all medications prescribed. He is doing well in the program with progression. He says he does feel stronger and feels the program is helping him meet his goals. Outpatient cardiac rehab services have been suspended due to the COVID-19 restrictions. Will continue to montior.   Cardiac Rehab closed 02/09/19 and reopened 05/25/19. Patient returned to the program gaining 20 lbs during the closure. He blames his weight gain on inactivity. Patient was sent for COVID-19 testing 06/05/19 due to respiratory symptoms. Test was negative but he can not return until he is symptom free per our Medical directors adivse. Patient has a televisit 06/12/19 with his PCP and was diagnosed with acute bronchitis and COPD exacerbation. He prescribed Prednisone, Levaquin, and Tessalon. Patient was told to call 06/25/19 which is 14 days after his test to see if he is symptom free. Will continue to monitor for progress.     Expected Outcomes  Patient will continue to attend sessions and complete the program meeting his personal goals.   Patient will continue to attend sessions and complete the  program meeting his personal goals.   Patient will continue to attend sessions and complete the program meeting his personal goals.         Core Components/Risk Factors/Patient Goals at Discharge (Final Review):  Goals and Risk Factor Review - 06/15/19 1540      Core Components/Risk Factors/Patient Goals Review   Personal Goals Review  Weight Management/Obesity   Lose 50 lbs long term/get heart healthy; keep weight off.   Review  Cardiac Rehab closed 02/09/19 and reopened 05/25/19. Patient returned to the program gaining 20 lbs during the closure. He blames his weight gain on inactivity. Patient was sent for COVID-19 testing 06/05/19 due to respiratory symptoms. Test was negative but he can not return until he is symptom free per our Medical directors adivse. Patient has a televisit 06/12/19 with his PCP and was diagnosed with acute bronchitis and COPD exacerbation. He prescribed Prednisone, Levaquin, and Tessalon. Patient was told to call 06/25/19 which is 14 days after his test to see if he is symptom free. Will continue to monitor for progress.    Expected Outcomes  Patient will continue to attend sessions and complete the program meeting his personal goals.        ITP Comments: ITP Comments    Row Name 02/13/19 1311 07/07/19 1526         ITP Comments  Cardiac Rehab services have been suspended due to the COVID-19 restrictions starting 02/09/2019. Patient will restart the progam when restrictions have been lifted.  Patient is still out with COVID-19 symptoms. His wife had tested postive and is currently hospitalized. Will continue to monitor.         Comments: ITP REVIEW Patient continues to be out with COVID concerns. Will continue to monitor.

## 2019-07-07 NOTE — Addendum Note (Signed)
Encounter addended by: Dwana Melena, RN on: 07/07/2019 3:31 PM  Actions taken: Clinical Note Signed

## 2019-07-08 ENCOUNTER — Inpatient Hospital Stay (HOSPITAL_COMMUNITY)
Admission: EM | Admit: 2019-07-08 | Discharge: 2019-07-18 | DRG: 177 | Disposition: A | Discharge status: Home / Self Care | Payer: BC Managed Care – PPO | Attending: Family Medicine | Admitting: Family Medicine

## 2019-07-08 ENCOUNTER — Encounter (HOSPITAL_COMMUNITY): Payer: BC Managed Care – PPO

## 2019-07-08 ENCOUNTER — Emergency Department (HOSPITAL_COMMUNITY): Payer: BC Managed Care – PPO

## 2019-07-08 ENCOUNTER — Encounter (HOSPITAL_COMMUNITY): Payer: Self-pay | Admitting: Emergency Medicine

## 2019-07-08 ENCOUNTER — Ambulatory Visit: Payer: Self-pay | Admitting: *Deleted

## 2019-07-08 DIAGNOSIS — R069 Unspecified abnormalities of breathing: Secondary | ICD-10-CM | POA: Diagnosis not present

## 2019-07-08 DIAGNOSIS — J9601 Acute respiratory failure with hypoxia: Secondary | ICD-10-CM

## 2019-07-08 DIAGNOSIS — I10 Essential (primary) hypertension: Secondary | ICD-10-CM | POA: Diagnosis present

## 2019-07-08 DIAGNOSIS — Z952 Presence of prosthetic heart valve: Secondary | ICD-10-CM

## 2019-07-08 DIAGNOSIS — J449 Chronic obstructive pulmonary disease, unspecified: Secondary | ICD-10-CM | POA: Diagnosis present

## 2019-07-08 DIAGNOSIS — E861 Hypovolemia: Secondary | ICD-10-CM | POA: Diagnosis present

## 2019-07-08 DIAGNOSIS — I2510 Atherosclerotic heart disease of native coronary artery without angina pectoris: Secondary | ICD-10-CM | POA: Diagnosis present

## 2019-07-08 DIAGNOSIS — N179 Acute kidney failure, unspecified: Secondary | ICD-10-CM | POA: Diagnosis not present

## 2019-07-08 DIAGNOSIS — Z8701 Personal history of pneumonia (recurrent): Secondary | ICD-10-CM

## 2019-07-08 DIAGNOSIS — I272 Pulmonary hypertension, unspecified: Secondary | ICD-10-CM | POA: Diagnosis present

## 2019-07-08 DIAGNOSIS — R0602 Shortness of breath: Secondary | ICD-10-CM | POA: Diagnosis not present

## 2019-07-08 DIAGNOSIS — Z954 Presence of other heart-valve replacement: Secondary | ICD-10-CM

## 2019-07-08 DIAGNOSIS — E119 Type 2 diabetes mellitus without complications: Secondary | ICD-10-CM | POA: Diagnosis not present

## 2019-07-08 DIAGNOSIS — J441 Chronic obstructive pulmonary disease with (acute) exacerbation: Secondary | ICD-10-CM | POA: Diagnosis present

## 2019-07-08 DIAGNOSIS — E785 Hyperlipidemia, unspecified: Secondary | ICD-10-CM | POA: Diagnosis present

## 2019-07-08 DIAGNOSIS — Z8249 Family history of ischemic heart disease and other diseases of the circulatory system: Secondary | ICD-10-CM

## 2019-07-08 DIAGNOSIS — R0789 Other chest pain: Secondary | ICD-10-CM | POA: Diagnosis not present

## 2019-07-08 DIAGNOSIS — I959 Hypotension, unspecified: Secondary | ICD-10-CM | POA: Diagnosis not present

## 2019-07-08 DIAGNOSIS — I4819 Other persistent atrial fibrillation: Secondary | ICD-10-CM | POA: Diagnosis not present

## 2019-07-08 DIAGNOSIS — G4733 Obstructive sleep apnea (adult) (pediatric): Secondary | ICD-10-CM | POA: Diagnosis present

## 2019-07-08 DIAGNOSIS — Z955 Presence of coronary angioplasty implant and graft: Secondary | ICD-10-CM | POA: Diagnosis not present

## 2019-07-08 DIAGNOSIS — I4811 Longstanding persistent atrial fibrillation: Secondary | ICD-10-CM | POA: Diagnosis not present

## 2019-07-08 DIAGNOSIS — M7989 Other specified soft tissue disorders: Secondary | ICD-10-CM | POA: Diagnosis not present

## 2019-07-08 DIAGNOSIS — Z7901 Long term (current) use of anticoagulants: Secondary | ICD-10-CM | POA: Diagnosis not present

## 2019-07-08 DIAGNOSIS — I252 Old myocardial infarction: Secondary | ICD-10-CM | POA: Diagnosis not present

## 2019-07-08 DIAGNOSIS — J440 Chronic obstructive pulmonary disease with acute lower respiratory infection: Secondary | ICD-10-CM | POA: Diagnosis not present

## 2019-07-08 DIAGNOSIS — J1289 Other viral pneumonia: Secondary | ICD-10-CM | POA: Diagnosis not present

## 2019-07-08 DIAGNOSIS — E871 Hypo-osmolality and hyponatremia: Secondary | ICD-10-CM | POA: Diagnosis present

## 2019-07-08 DIAGNOSIS — I5042 Chronic combined systolic (congestive) and diastolic (congestive) heart failure: Secondary | ICD-10-CM | POA: Diagnosis not present

## 2019-07-08 DIAGNOSIS — R0902 Hypoxemia: Secondary | ICD-10-CM | POA: Diagnosis not present

## 2019-07-08 DIAGNOSIS — Z87442 Personal history of urinary calculi: Secondary | ICD-10-CM | POA: Diagnosis not present

## 2019-07-08 DIAGNOSIS — I110 Hypertensive heart disease with heart failure: Secondary | ICD-10-CM | POA: Diagnosis present

## 2019-07-08 DIAGNOSIS — Z6841 Body Mass Index (BMI) 40.0 and over, adult: Secondary | ICD-10-CM

## 2019-07-08 DIAGNOSIS — R791 Abnormal coagulation profile: Secondary | ICD-10-CM | POA: Diagnosis present

## 2019-07-08 DIAGNOSIS — J4489 Other specified chronic obstructive pulmonary disease: Secondary | ICD-10-CM | POA: Diagnosis present

## 2019-07-08 DIAGNOSIS — J069 Acute upper respiratory infection, unspecified: Secondary | ICD-10-CM | POA: Diagnosis not present

## 2019-07-08 DIAGNOSIS — E875 Hyperkalemia: Secondary | ICD-10-CM | POA: Diagnosis present

## 2019-07-08 DIAGNOSIS — U071 COVID-19: Secondary | ICD-10-CM | POA: Diagnosis present

## 2019-07-08 DIAGNOSIS — E860 Dehydration: Secondary | ICD-10-CM | POA: Diagnosis present

## 2019-07-08 DIAGNOSIS — Z209 Contact with and (suspected) exposure to unspecified communicable disease: Secondary | ICD-10-CM | POA: Diagnosis not present

## 2019-07-08 LAB — COMPREHENSIVE METABOLIC PANEL
ALT: 78 U/L — ABNORMAL HIGH (ref 0–44)
AST: 116 U/L — ABNORMAL HIGH (ref 15–41)
Albumin: 3.3 g/dL — ABNORMAL LOW (ref 3.5–5.0)
Alkaline Phosphatase: 62 U/L (ref 38–126)
Anion gap: 13 (ref 5–15)
BUN: 37 mg/dL — ABNORMAL HIGH (ref 8–23)
CO2: 25 mmol/L (ref 22–32)
Calcium: 8 mg/dL — ABNORMAL LOW (ref 8.9–10.3)
Chloride: 91 mmol/L — ABNORMAL LOW (ref 98–111)
Creatinine, Ser: 1.65 mg/dL — ABNORMAL HIGH (ref 0.61–1.24)
GFR calc Af Amer: 51 mL/min — ABNORMAL LOW (ref >60)
GFR calc non Af Amer: 44 mL/min — ABNORMAL LOW (ref >60)
Glucose, Bld: 95 mg/dL (ref 70–99)
Potassium: 3.8 mmol/L (ref 3.5–5.1)
Sodium: 129 mmol/L — ABNORMAL LOW (ref 135–145)
Total Bilirubin: 1.5 mg/dL — ABNORMAL HIGH (ref 0.3–1.2)
Total Protein: 6.7 g/dL (ref 6.5–8.1)

## 2019-07-08 LAB — CBC WITH DIFFERENTIAL/PLATELET
Abs Immature Granulocytes: 0.09 10*3/uL — ABNORMAL HIGH (ref 0.00–0.07)
Basophils Absolute: 0 10*3/uL (ref 0.0–0.1)
Basophils Relative: 0 %
Eosinophils Absolute: 0 10*3/uL (ref 0.0–0.5)
Eosinophils Relative: 0 %
HCT: 36.1 % — ABNORMAL LOW (ref 39.0–52.0)
Hemoglobin: 12.1 g/dL — ABNORMAL LOW (ref 13.0–17.0)
Immature Granulocytes: 1 %
Lymphocytes Relative: 6 %
Lymphs Abs: 0.6 10*3/uL — ABNORMAL LOW (ref 0.7–4.0)
MCH: 28.9 pg (ref 26.0–34.0)
MCHC: 33.5 g/dL (ref 30.0–36.0)
MCV: 86.2 fL (ref 80.0–100.0)
Monocytes Absolute: 0.2 10*3/uL (ref 0.1–1.0)
Monocytes Relative: 2 %
Neutro Abs: 9.3 10*3/uL — ABNORMAL HIGH (ref 1.7–7.7)
Neutrophils Relative %: 91 %
Platelets: 300 10*3/uL (ref 150–400)
RBC: 4.19 MIL/uL — ABNORMAL LOW (ref 4.22–5.81)
RDW: 15.3 % (ref 11.5–15.5)
WBC: 10.1 10*3/uL (ref 4.0–10.5)
nRBC: 0 % (ref 0.0–0.2)

## 2019-07-08 LAB — FERRITIN: Ferritin: 6323 ng/mL — ABNORMAL HIGH (ref 24–336)

## 2019-07-08 LAB — PROTIME-INR
INR: 1.8 — ABNORMAL HIGH (ref 0.8–1.2)
Prothrombin Time: 20.2 seconds — ABNORMAL HIGH (ref 11.4–15.2)

## 2019-07-08 LAB — TRIGLYCERIDES: Triglycerides: 180 mg/dL — ABNORMAL HIGH (ref <150)

## 2019-07-08 LAB — FIBRINOGEN: Fibrinogen: >800 mg/dL — ABNORMAL HIGH (ref 210–475)

## 2019-07-08 LAB — LACTIC ACID, PLASMA: Lactic Acid, Venous: 1.5 mmol/L (ref 0.5–1.9)

## 2019-07-08 LAB — LACTATE DEHYDROGENASE: LDH: 906 U/L — ABNORMAL HIGH (ref 98–192)

## 2019-07-08 LAB — C-REACTIVE PROTEIN: CRP: 25.2 mg/dL — ABNORMAL HIGH (ref <1.0)

## 2019-07-08 LAB — PROCALCITONIN: Procalcitonin: 0.14 ng/mL

## 2019-07-08 LAB — D-DIMER, QUANTITATIVE: D-Dimer, Quant: 3.5 ug/mL-FEU — ABNORMAL HIGH (ref 0.00–0.50)

## 2019-07-08 MED ORDER — ALBUTEROL SULFATE HFA 108 (90 BASE) MCG/ACT IN AERS
2.0000 | INHALATION_SPRAY | Freq: Four times a day (QID) | RESPIRATORY_TRACT | Status: DC
  Administered 2019-07-09 – 2019-07-18 (×33): 2 via RESPIRATORY_TRACT
  Filled 2019-07-08 (×2): qty 6.7

## 2019-07-08 MED ORDER — SODIUM CHLORIDE 0.9 % IV BOLUS
500.0000 mL | Freq: Once | INTRAVENOUS | Status: AC
  Administered 2019-07-08: 500 mL via INTRAVENOUS

## 2019-07-08 MED ORDER — FLUTICASONE-UMECLIDIN-VILANT 100-62.5-25 MCG/INH IN AEPB: 1.0000 | INHALATION_SPRAY | Freq: Every day | RESPIRATORY_TRACT | Status: DC

## 2019-07-08 MED ORDER — WARFARIN - PHARMACIST DOSING INPATIENT
Freq: Every day | Status: DC
  Administered 2019-07-15 – 2019-07-16 (×2): 1

## 2019-07-08 MED ORDER — ACETAMINOPHEN 325 MG PO TABS: 650.0000 mg | ORAL_TABLET | Freq: Four times a day (QID) | ORAL | Status: DC | PRN

## 2019-07-08 MED ORDER — BENZONATATE 100 MG PO CAPS
200.0000 mg | ORAL_CAPSULE | Freq: Three times a day (TID) | ORAL | Status: DC | PRN
  Administered 2019-07-10 – 2019-07-17 (×3): 200 mg via ORAL
  Filled 2019-07-08 (×3): qty 2

## 2019-07-08 MED ORDER — ACETAMINOPHEN 325 MG PO TABS
650.0000 mg | ORAL_TABLET | Freq: Once | ORAL | Status: AC
  Administered 2019-07-08: 650 mg via ORAL
  Filled 2019-07-08: qty 2

## 2019-07-08 MED ORDER — DEXAMETHASONE SODIUM PHOSPHATE 10 MG/ML IJ SOLN
6.0000 mg | Freq: Every day | INTRAMUSCULAR | Status: DC
  Administered 2019-07-08 – 2019-07-09 (×2): 6 mg via INTRAVENOUS
  Filled 2019-07-08 (×2): qty 1

## 2019-07-08 MED ORDER — SODIUM CHLORIDE 0.9 % IV SOLN
INTRAVENOUS | Status: AC
  Administered 2019-07-08 (×2): via INTRAVENOUS

## 2019-07-08 MED ORDER — SODIUM CHLORIDE 0.9 % IV SOLN
200.0000 mg | Freq: Once | INTRAVENOUS | Status: AC
  Administered 2019-07-08: 200 mg via INTRAVENOUS
  Filled 2019-07-08: qty 40

## 2019-07-08 MED ORDER — WARFARIN SODIUM 6 MG PO TABS: 6.0000 mg | ORAL_TABLET | Freq: Once | ORAL | Status: DC

## 2019-07-08 MED ORDER — TOCILIZUMAB 400 MG/20ML IV SOLN
800.0000 mg | Freq: Once | INTRAVENOUS | Status: AC
  Administered 2019-07-09: 800 mg via INTRAVENOUS
  Filled 2019-07-08: qty 40

## 2019-07-08 MED ORDER — FLUTICASONE FUROATE-VILANTEROL 100-25 MCG/INH IN AEPB
1.0000 | INHALATION_SPRAY | Freq: Every day | RESPIRATORY_TRACT | Status: DC
  Administered 2019-07-09 – 2019-07-18 (×10): 1 via RESPIRATORY_TRACT
  Filled 2019-07-08: qty 28

## 2019-07-08 MED ORDER — SODIUM CHLORIDE 0.9 % IV SOLN
100.0000 mg | INTRAVENOUS | Status: AC
  Administered 2019-07-09 – 2019-07-12 (×4): 100 mg via INTRAVENOUS
  Filled 2019-07-08 (×4): qty 20

## 2019-07-08 MED ORDER — ASPIRIN EC 81 MG PO TBEC
81.0000 mg | DELAYED_RELEASE_TABLET | Freq: Every day | ORAL | Status: DC
  Administered 2019-07-09 – 2019-07-18 (×10): 81 mg via ORAL
  Filled 2019-07-08 (×12): qty 1

## 2019-07-08 MED ORDER — WARFARIN SODIUM 5 MG PO TABS
7.5000 mg | ORAL_TABLET | Freq: Once | ORAL | Status: AC
  Administered 2019-07-08: 7.5 mg via ORAL
  Filled 2019-07-08: qty 1.5
  Filled 2019-07-08: qty 2

## 2019-07-08 MED ORDER — BISACODYL 5 MG PO TBEC: 5.0000 mg | DELAYED_RELEASE_TABLET | Freq: Every day | ORAL | Status: DC | PRN

## 2019-07-08 MED ORDER — ALBUTEROL SULFATE HFA 108 (90 BASE) MCG/ACT IN AERS
4.0000 | INHALATION_SPRAY | RESPIRATORY_TRACT | Status: DC | PRN
  Administered 2019-07-08 – 2019-07-10 (×2): 4 via RESPIRATORY_TRACT
  Filled 2019-07-08: qty 6.7

## 2019-07-08 MED ORDER — UMECLIDINIUM BROMIDE 62.5 MCG/INH IN AEPB
1.0000 | INHALATION_SPRAY | Freq: Every day | RESPIRATORY_TRACT | Status: DC
  Administered 2019-07-09 – 2019-07-18 (×9): 1 via RESPIRATORY_TRACT
  Filled 2019-07-08 (×2): qty 7

## 2019-07-08 MED ORDER — ATORVASTATIN CALCIUM 40 MG PO TABS
40.0000 mg | ORAL_TABLET | Freq: Every evening | ORAL | Status: DC
  Administered 2019-07-09 – 2019-07-18 (×10): 40 mg via ORAL
  Filled 2019-07-08 (×2): qty 1
  Filled 2019-07-08: qty 4
  Filled 2019-07-08 (×7): qty 1

## 2019-07-08 NOTE — Progress Notes (Addendum)
ANTICOAGULATION CONSULT NOTE - Initial Consult  Pharmacy Consult for warfarin Indication: mechanical valve  Allergies  Allergen Reactions  . Ramipril Cough    cough  . Percocet [Oxycodone-Acetaminophen]     In ICU it contributed to hallucinations. Can take by itself    Patient Measurements: Height: 5\' 11"  (180.3 cm) Weight: 300 lb (136.1 kg) IBW/kg (Calculated) : 75.3   Vital Signs: Temp: 98.8 F (37.1 C) (08/12 1734) Temp Source: Oral (08/12 1734) BP: 84/47 (08/12 1700) Pulse Rate: 80 (08/12 1715)  Labs: Recent Labs    07/08/19 1456  HGB 12.1*  HCT 36.1*  PLT 300  LABPROT 20.2*  INR 1.8*  CREATININE 1.65*    Estimated Creatinine Clearance: 65.4 mL/min (A) (by C-G formula based on SCr of 1.65 mg/dL (H)).   Medical History: Past Medical History:  Diagnosis Date  . Chronic combined systolic and diastolic congestive heart failure ([place])   . COPD with chronic bronchitis ([place])   . Coronary artery disease   . Essential hypertension, benign    Ramipril to losartan Sept 2015   . History of pneumonia    RML on CXR 07/20/14  . Hyperlipidemia   . Longstanding persistent atrial fibrillation   . Mitral stenosis with regurgitation   . Myocardial infarction ([place])   . Pulmonary hypertension ([place])   . Renal disorder    history kidney stone  . S/P Maze operation for atrial fibrillation 07/18/2018   Complete bilateral atrial lesion set using bipolar radiofrequency and cryothermy ablation with clipping of LA appendage  . S/P mitral valve replacement with metallic valve 4/82/5003   Sorin Carbomedics Optiform bileaflet mechanical valve, size 33 mm  . S/P TVR (tricuspid valve repair) 07/18/2018   Edwards mc3 ring annuloplasty, size 30 mm    Medications:  (Not in a hospital admission)   Assessment: Pharmacy consulted to dose warfarin in patient with mechanical mitral valve.  Home dose listed as 6 mg Mon/Thur and 4 mg ROW  INR on admission is 1.8  Goal of Therapy:  INR  2.5-3.5 Monitor platelets by anticoagulation protocol: Yes   Plan:  Warfarin 7.5 mg x 1 dose. Monitor daily INR and s/s of bleeding  Ramond Craver 07/08/2019,7:50 PM

## 2019-07-08 NOTE — Telephone Encounter (Signed)
Noted, I believe my assistant was trying to get in touch with him yesterday, 911/ER evaluation seems appropriate to me.

## 2019-07-08 NOTE — ED Triage Notes (Signed)
PT c/o SOB with any exertion, fever and is known to be Covid positive which resulted today from outpatient lab. PT states his wife is covid positive and is a patient at [facility]. PT 78% on room air and has tachypnea.

## 2019-07-08 NOTE — H&P (Signed)
History and Physical    Jeffrey Larson JSH:[id number] DOB: Apr 15, 1956 DOA: 07/08/2019  I have briefly reviewed the patient's prior medical records in [place]  PCP: [name], DO  Patient coming from: home  Chief Complaint: Shortness of breath  HPI: Jeffrey Larson is a 63 y.o. male with medical history significant of persistent A. fib with history of maze operation, mitral valve replacement with metallic valve, tricuspid valve repair, hypertension, COPD, CAD, hyperlipidemia, obesity, chronic diastolic CHF, presents to the hospital with complaints of shortness of breath.  Patient's wife has been tested positive for Covid and she has been hospitalized at [facility] where she is recovering.  He has been feeling sick for the past 5 days with fever, chills, fatigue, malaise, and over the last 2 days he has had progressive shortness of breath.  Today his breathing became quite "tight" and he decided to come to the hospital.  He denies any chest pain, denies any abdominal pain, no nausea vomiting or diarrhea.  He thinks that he may have contracted Covid from church.  ED Course: In the emergency room he is febrile to 103.2, tachypneic with respiratory rate into the 20s-30s, blood pressure resolved in the 80s to 90s, and he is requiring 4 L nasal cannula to maintain O2 sats above 90%.  His blood work reveals sodium of 129, creatinine of 1.65, slightly elevated LFTs with AST 116 ALT 78, total bilirubin 1.5, his white count is normal at 10.1.  His lactic acid is 1.5.  D-dimer is 3.5.  Covid was +2 days ago as an outpatient.  Procalcitonin and CRP are pending  Review of Systems: As per HPI otherwise 10 point review of systems negative.   Past Medical History:  Diagnosis Date  . Chronic combined systolic and diastolic congestive heart failure ([place])   . COPD with chronic bronchitis ([place])   . Coronary artery disease   . Essential hypertension, benign    Ramipril to losartan  Sept 2015   . History of pneumonia    RML on CXR 07/20/14  . Hyperlipidemia   . Longstanding persistent atrial fibrillation   . Mitral stenosis with regurgitation   . Myocardial infarction ([place])   . Pulmonary hypertension ([place])   . Renal disorder    history kidney stone  . S/P Maze operation for atrial fibrillation 07/18/2018   Complete bilateral atrial lesion set using bipolar radiofrequency and cryothermy ablation with clipping of LA appendage  . S/P mitral valve replacement with metallic valve 8/50/2774   Sorin Carbomedics Optiform bileaflet mechanical valve, size 33 mm  . S/P TVR (tricuspid valve repair) 07/18/2018   Edwards mc3 ring annuloplasty, size 30 mm    Past Surgical History:  Procedure Laterality Date  . APPENDECTOMY    . CARDIOVASCULAR STRESS TEST  03/2014   Borderline reversible ischemic changes at the apex.  Normal LV contractility/EF 52%.  . CORONARY STENT PLACEMENT  7/12013  . LEFT HEART CATHETERIZATION WITH CORONARY ANGIOGRAM N/A 06/12/2012   Procedure: LEFT HEART CATHETERIZATION WITH CORONARY ANGIOGRAM;  Surgeon: Clent Demark, MD;  Location: [facility] CATH LAB;  Service: Cardiovascular;  Laterality: N/A;  . MAZE N/A 07/18/2018   Procedure: MAZE;  Surgeon: Rexene Alberts, MD;  Location: [place];  Service: Open Heart Surgery;  Laterality: N/A;  . MITRAL VALVE REPLACEMENT N/A 07/18/2018   Procedure: MITRAL VALVE (MV) REPLACEMENT WITH CARBOMEDICS OPTIFORM MITRAL VALVE SIZE 33.;  Surgeon: Rexene Alberts, MD;  Location: [place];  Service:  Open Heart Surgery;  Laterality: N/A;  . PERCUTANEOUS CORONARY STENT INTERVENTION (PCI-S) N/A 06/17/2012   Procedure: PERCUTANEOUS CORONARY STENT INTERVENTION (PCI-S);  Surgeon: Clent Demark, MD;  Location: [facility] CATH LAB;  Service: Cardiovascular;  Laterality: N/A;  . RIGHT/LEFT HEART CATH AND CORONARY ANGIOGRAPHY N/A 06/10/2018   Procedure: RIGHT/LEFT HEART CATH AND CORONARY ANGIOGRAPHY;  Surgeon: Dixie Dials, MD;  Location: [place] CV LAB;   Service: Cardiovascular;  Laterality: N/A;  . TEE WITHOUT CARDIOVERSION N/A 06/10/2018   Procedure: TRANSESOPHAGEAL ECHOCARDIOGRAM (TEE) with bubble study;  Surgeon: Dixie Dials, MD;  Location: [facility] ENDOSCOPY;  Service: Cardiovascular;  Laterality: N/A;  . TEE WITHOUT CARDIOVERSION N/A 07/18/2018   Procedure: TRANSESOPHAGEAL ECHOCARDIOGRAM (TEE);  Surgeon: Rexene Alberts, MD;  Location: [place];  Service: Open Heart Surgery;  Laterality: N/A;  . TRICUSPID VALVE REPLACEMENT N/A 07/18/2018   Procedure: TRICUSPID VALVE REPAIR WITH EDWARDS MC3 TRICUSPID ANNULOPLASTY RING SIZE 30.;  Surgeon: Rexene Alberts, MD;  Location: [place];  Service: Open Heart Surgery;  Laterality: N/A;     reports that he quit smoking about 22 years ago. His smoking use included cigarettes. He has a 30.00 pack-year smoking history. He has never used smokeless tobacco. He reports that he does not drink alcohol or use drugs.  Allergies  Allergen Reactions  . Ramipril Cough    cough  . Percocet [Oxycodone-Acetaminophen]     In ICU it contributed to hallucinations. Can take by itself    Family History  Problem Relation Age of Onset  . Heart disease Mother   . Heart disease Father     Prior to Admission medications   Medication Sig Start Date End Date Taking? Authorizing Provider  acetaminophen (TYLENOL) 500 MG tablet Take 1,000 mg by mouth 2 (two) times daily.    [provider]  albuterol (PROVENTIL) (5 MG/ML) 0.5% nebulizer solution Take 0.5 mLs (2.5 mg total) by nebulization every 2 (two) hours as needed for wheezing or shortness of breath. 06/24/19   [name], DO  albuterol (VENTOLIN HFA) 108 (90 Base) MCG/ACT inhaler Inhale 2 puffs into the lungs 2 (two) times daily. 06/24/19   [name], DO  AMBULATORY NON FORMULARY MEDICATION Medication Name: Glucometer, lancets and strip to test every other day. Dx: Diabetes type 2, E11.9. 100 strip and 100 lancets 08/02/17   Hali Marry, MD   amoxicillin (AMOXIL) 500 MG capsule Take 4 capsules by mouth 30-60 minutes prior to dental procedure. Patient not taking: Reported on 06/12/2019 06/02/19   Herminio Commons, MD  aspirin 81 MG tablet Take 81 mg by mouth daily.    [provider]  atorvastatin (LIPITOR) 40 MG tablet Take 1 tablet (40 mg total) by mouth every evening. 01/20/19   Herminio Commons, MD  benzonatate (TESSALON) 200 MG capsule Take 1 capsule (200 mg total) by mouth 3 (three) times daily as needed for cough. 06/12/19   [name], DO  Fluticasone-Umeclidin-Vilant (TRELEGY ELLIPTA) 100-62.5-25 MCG/INH AEPB Inhale 1 puff into the lungs daily. NEEDS TO SCHEDULE OFFICE VISIT 06/24/19   [name], DO  levofloxacin (LEVAQUIN) 500 MG tablet Take 1 tablet (500 mg total) by mouth daily. 06/24/19   [name], DO  metoprolol succinate (TOPROL-XL) 50 MG 24 hr tablet Take 1 tablet (50 mg total) by mouth daily. Take with or immediately following a meal. 07/31/18   Barrett, Erin R, PA-C  nitroGLYCERIN (NITROSTAT) 0.4 MG SL tablet Place 1 tablet (0.4 mg total) under the tongue every 5 (  five) minutes x 3 doses as needed for chest pain. 12/03/18 01/26/25  Bensimhon, Shaune Pascal, MD  Omega-3 Fatty Acids (FISH OIL) 1200 MG CAPS Take 1,200 mg by mouth daily.    [provider]  oxymetazoline (AFRIN) 0.05 % nasal spray Place 1 spray into both nostrils daily.    [provider]  predniSONE (DELTASONE) 20 MG tablet Take 1 tablet (20 mg total) by mouth 2 (two) times daily with a meal. 06/24/19   [name], DO  sacubitril-valsartan (ENTRESTO) 24-26 MG Take 1 tablet by mouth 2 (two) times daily. 03/06/19   Duke, Tami Lin, PA  sildenafil (REVATIO) 20 MG tablet Take 1 tablet (20 mg total) by mouth 3 (three) times daily. 12/23/18   Bensimhon, Shaune Pascal, MD  spironolactone (ALDACTONE) 25 MG tablet TAKE 1 TABLET(25 MG) BY MOUTH DAILY 06/17/19   Herminio Commons, MD  torsemide (DEMADEX) 20 MG tablet  Take 2 tablets (40 mg total) by mouth daily. 08/08/18   Darrick Grinder D, NP  warfarin (COUMADIN) 4 MG tablet Take 1 tablet daily except 1 1/2 tablets on Mondays and Thursdays or as directed 06/02/19   Herminio Commons, MD    Physical Exam: Vitals:   07/08/19 1600 07/08/19 1615 07/08/19 1630 07/08/19 1645  BP: (!) 81/71  98/61   Pulse: 88 87 83 90  Resp: (!) 34 (!) 26 (!) 28 (!) 28  Temp:      TempSrc:      SpO2: 90% 91% 92% 94%  Weight:      Height:        Constitutional: Appears tachypneic but overall in no significant distress, laying in bed Eyes: PERRL, lids and conjunctivae normal ENMT: Mucous membranes are dry Neck: normal, supple, no masses, no thyromegaly Respiratory: Diffuse bilateral end expiratory wheezing, no crackles.  Moves air well.  Increased respiratory effort and tachypneic Cardiovascular: Regular rate and rhythm, no murmurs / rubs / gallops. No extremity edema. 2+ pedal pulses.  Abdomen: no tenderness, no masses palpated. Bowel sounds positive.  Musculoskeletal: no clubbing / cyanosis. Normal muscle tone.  Skin: no rashes, lesions, ulcers. No induration Neurologic: CN 2-12 grossly intact. Strength 5/5 in all 4.  Psychiatric: Normal judgment and insight. Alert and oriented x 3. Normal mood.   Labs on Admission: I have personally reviewed following labs and imaging studies  CBC: Recent Labs  Lab 07/08/19 1456  WBC 10.1  NEUTROABS 9.3*  HGB 12.1*  HCT 36.1*  MCV 86.2  PLT 712   Basic Metabolic Panel: Recent Labs  Lab 07/08/19 1456  NA 129*  K 3.8  CL 91*  CO2 25  GLUCOSE 95  BUN 37*  CREATININE 1.65*  CALCIUM 8.0*   GFR: Estimated Creatinine Clearance: 65.4 mL/min (A) (by C-G formula based on SCr of 1.65 mg/dL (H)). Liver Function Tests: Recent Labs  Lab 07/08/19 1456  AST 116*  ALT 78*  ALKPHOS 62  BILITOT 1.5*  PROT 6.7  ALBUMIN 3.3*   No results for input(s): LIPASE, AMYLASE in the last 168 hours. No results for input(s): AMMONIA in  the last 168 hours. Coagulation Profile: No results for input(s): INR, PROTIME in the last 168 hours. Cardiac Enzymes: No results for input(s): CKTOTAL, CKMB, CKMBINDEX, TROPONINI in the last 168 hours. BNP (last 3 results) No results for input(s): PROBNP in the last 8760 hours. HbA1C: No results for input(s): HGBA1C in the last 72 hours. CBG: No results for input(s): GLUCAP in the last 168 hours. Lipid Profile:  Recent Labs    07/08/19 1529  TRIG 180*   Thyroid Function Tests: No results for input(s): TSH, T4TOTAL, FREET4, T3FREE, THYROIDAB in the last 72 hours. Anemia Panel: No results for input(s): VITAMINB12, FOLATE, FERRITIN, TIBC, IRON, RETICCTPCT in the last 72 hours. Urine analysis:    Component Value Date/Time   COLORURINE YELLOW 07/15/2018 1402   APPEARANCEUR CLEAR 07/15/2018 1402   LABSPEC 1.013 07/15/2018 1402   PHURINE 5.0 07/15/2018 1402   GLUCOSEU NEGATIVE 07/15/2018 1402   HGBUR SMALL (A) 07/15/2018 1402   BILIRUBINUR NEGATIVE 07/15/2018 1402   BILIRUBINUR NEGATIVE 04/30/2016 1516   KETONESUR NEGATIVE 07/15/2018 1402   PROTEINUR NEGATIVE 07/15/2018 1402   UROBILINOGEN 4.0 04/30/2016 1516   NITRITE NEGATIVE 07/15/2018 1402   LEUKOCYTESUR NEGATIVE 07/15/2018 1402     Radiological Exams on Admission: Dg Chest Port 1 View  Result Date: 07/08/2019 CLINICAL DATA:  Dyspnea.  COVID-19 pneumonia. EXAM: PORTABLE CHEST 1 VIEW COMPARISON:  08/12/2018 chest radiograph. FINDINGS: Intact sternotomy wires. Cardiac valve prosthesis in place. Stable cardiomediastinal silhouette with mild cardiomegaly. No pneumothorax. No pleural effusion. Patchy opacities throughout lungs bilaterally, most prominent in peripheral lower lungs bilaterally. IMPRESSION: Patchy peripheral lower lung opacities bilaterally, compatible with COVID-19 pneumonia. Cardiomegaly. Electronically Signed   By: Ilona Sorrel M.D.   On: 07/08/2019 15:59    EKG: Independently reviewed.  Appears sinus with PACs   Assessment/Plan Active Problems:   2019 novel coronavirus disease (COVID-19)   Principal Problem Acute hypoxic respiratory failure in the setting of COVID-19 pneumonia -Patient will be admitted to progressive unit, will admit to [facility] since [place] has no beds available, he will give Remdesivir given hypoxia, bilateral infiltrates and positive testing, place patient on Decadron 6 mg daily for 10 days.  He will also be given Actemra. The patient has hypoxia and is high-risk for intubation , but expected to survive >48 hours and has good baseline functional status.  She is not known to be on immunomodulators, anti-rejection medications, or cancer chemotherapy, has no history of TB or latent TB, and no history of diverticulitis or intestinal perforation.  Platelets are >50K, ANC is >500, and ALT/AST are below 5x ULN with no known hepatitis B infection. The investigational nature of this medication was discussed with the patient/HCPOA and they agree the use of this medication. -Procalcitonin is low, hold off on additional antibiotics  Active Problems Acute kidney injury -Patient's prior creatinine has been 0.9-1.0 range although he did have few elevated values in the past but no known diagnosis of chronic kidney disease -Currently appears slightly dehydrated, will give IV fluids over the next 10 hours and reassess fluid status, blood pressure and renal function in the morning  Hyponatremia -Likely in the setting of dehydration, will give IV fluids  COPD exacerbation -Patient carries a diagnosis of COPD, he is aware but not sure that he believes this diagnosis.  He has wheezing on exam, will provide inhalers as well as steroids as above  History of A. fib status post maze repair/valve replacement/repair -Currently rate controlled, continue Coumadin, hold metoprolol given hypotension  History of chronic combined CHF -Most recent 2D echo showed normal EF.  He is on Entresto which will  be hold due to hypotension  Hypotension -Clinically appears dehydrated, hold torsemide, spironolactone, Entresto, metoprolol.  Resume as indicated  Hyperlipidemia -Continue atorvastatin  Coronary artery disease -Continue aspirin, no chest pain    DVT prophylaxis: Lovenox Code Status: Full code Family Communication: Discussed with patient Disposition Plan:  Admit to [facility], [facility] [address]. Consults called: None   Marzetta Board, MD, PhD [facility]  Contact via [email or web address]  [facility] Office Info P: [phone] F: [phone]   07/08/2019, 5:05 PM

## 2019-07-08 NOTE — ED Provider Notes (Signed)
[facility] Provider Note   CSN: [id number] Arrival date & time: 07/08/19  1412     History   Chief Complaint Chief Complaint  Patient presents with   Shortness of Breath    HPI Jeffrey Larson is a 63 y.o. male.     The history is provided by the patient. No language interpreter was used.  Shortness of Breath    63 year old male with history of CAD, COPD, hypertension, prior MI, pulmonary hypertension recently test positive for COVID-19 presented to ED for shortness of breath.  Patient report for the past 4 to 5 days he has been feeling bad, he has fever, chills, body aches, decrease in appetite, shortness of breath, chest tightness increase shortness of breath with exertion.  His wife test positive for COVID-19 and is currently at [facility].  Patient report he had an outpatient lab test for COVID-19 which came back positive today.  He denies tobacco use.  He does have an inhaler and a nebulizer at home that he uses infrequently.  No report of nausea vomiting diarrhea or loss of taste or smell.  No dysuria.  Past Medical History:  Diagnosis Date   Chronic combined systolic and diastolic congestive heart failure (HCC)    COPD with chronic bronchitis (HCC)    Coronary artery disease    Essential hypertension, benign    Ramipril to losartan Sept 2015    History of pneumonia    RML on CXR 07/20/14   Hyperlipidemia    Longstanding persistent atrial fibrillation    Mitral stenosis with regurgitation    Myocardial infarction [facility])    Pulmonary hypertension ([place])    Renal disorder    history kidney stone   S/P Maze operation for atrial fibrillation 07/18/2018   Complete bilateral atrial lesion set using bipolar radiofrequency and cryothermy ablation with clipping of LA appendage   S/P mitral valve replacement with metallic valve 8/00/3491   Sorin Carbomedics Optiform bileaflet mechanical valve, size 33 mm   S/P TVR (tricuspid valve  repair) 07/18/2018   Edwards mc3 ring annuloplasty, size 30 mm    Patient Active Problem List   Diagnosis Date Noted   Encounter for therapeutic drug monitoring 08/04/2018   Pressure injury of skin 07/30/2018   S/P mitral valve replacement with metallic valve + tricuspid valve repair + maze procedure 07/18/2018   S/P TVR (tricuspid valve repair) 07/18/2018   S/P Maze operation for atrial fibrillation 07/18/2018   Tricuspid valve insufficiency 07/15/2018   Persistent atrial fibrillation 07/15/2018   Morbid obesity ([place])    Chronic combined systolic and diastolic congestive heart failure ([place])    Pulmonary hypertension ([place])    Cold sore 10/28/2017   Neck pain 10/01/2017   Coronary artery disease    Controlled type 2 diabetes mellitus without complication, without long-term current use of insulin ([place]) 08/02/2017   Cardiac disease 04/18/2017   Longstanding persistent atrial fibrillation    Chronic combined systolic and diastolic heart failure ([place]) 11/12/2016   Right shoulder pain 07/09/2016   Bilateral low back pain without sciatica 04/30/2016   Hematuria, microscopic 04/30/2016   COPD with chronic bronchitis ([place]) 09/08/2014   Mitral stenosis with regurgitation    History of MI (myocardial infarction) 08/04/2014   Hyperlipidemia 08/04/2014   Essential hypertension, benign 08/04/2014    Past Surgical History:  Procedure Laterality Date   APPENDECTOMY     CARDIOVASCULAR STRESS TEST  03/2014   Borderline reversible ischemic changes at the apex.  Normal LV contractility/EF 52%.   CORONARY STENT PLACEMENT  7/12013   LEFT HEART CATHETERIZATION WITH CORONARY ANGIOGRAM N/A 06/12/2012   Procedure: LEFT HEART CATHETERIZATION WITH CORONARY ANGIOGRAM;  Surgeon: Clent Demark, MD;  Location: [facility] CATH LAB;  Service: Cardiovascular;  Laterality: N/A;   MAZE N/A 07/18/2018   Procedure: MAZE;  Surgeon: Rexene Alberts, MD;  Location: [facility];  Service: Open Heart  Surgery;  Laterality: N/A;   MITRAL VALVE REPLACEMENT N/A 07/18/2018   Procedure: MITRAL VALVE (MV) REPLACEMENT WITH CARBOMEDICS OPTIFORM MITRAL VALVE SIZE 33.;  Surgeon: Rexene Alberts, MD;  Location: [facility];  Service: Open Heart Surgery;  Laterality: N/A;   PERCUTANEOUS CORONARY STENT INTERVENTION (PCI-S) N/A 06/17/2012   Procedure: PERCUTANEOUS CORONARY STENT INTERVENTION (PCI-S);  Surgeon: Clent Demark, MD;  Location: [facility] CATH LAB;  Service: Cardiovascular;  Laterality: N/A;   RIGHT/LEFT HEART CATH AND CORONARY ANGIOGRAPHY N/A 06/10/2018   Procedure: RIGHT/LEFT HEART CATH AND CORONARY ANGIOGRAPHY;  Surgeon: Dixie Dials, MD;  Location: [facility] CV LAB;  Service: Cardiovascular;  Laterality: N/A;   TEE WITHOUT CARDIOVERSION N/A 06/10/2018   Procedure: TRANSESOPHAGEAL ECHOCARDIOGRAM (TEE) with bubble study;  Surgeon: Dixie Dials, MD;  Location: [facility] ENDOSCOPY;  Service: Cardiovascular;  Laterality: N/A;   TEE WITHOUT CARDIOVERSION N/A 07/18/2018   Procedure: TRANSESOPHAGEAL ECHOCARDIOGRAM (TEE);  Surgeon: Rexene Alberts, MD;  Location: [place];  Service: Open Heart Surgery;  Laterality: N/A;   TRICUSPID VALVE REPLACEMENT N/A 07/18/2018   Procedure: TRICUSPID VALVE REPAIR WITH EDWARDS MC3 TRICUSPID ANNULOPLASTY RING SIZE 30.;  Surgeon: Rexene Alberts, MD;  Location: [facility] [place];  Service: Open Heart Surgery;  Laterality: N/A;        Home Medications    Prior to Admission medications   Medication Sig Start Date End Date Taking? Authorizing Provider  acetaminophen (TYLENOL) 500 MG tablet Take 1,000 mg by mouth 2 (two) times daily.    [provider]  albuterol (PROVENTIL) (5 MG/ML) 0.5% nebulizer solution Take 0.5 mLs (2.5 mg total) by nebulization every 2 (two) hours as needed for wheezing or shortness of breath. 06/24/19   [name], DO  albuterol (VENTOLIN HFA) 108 (90 Base) MCG/ACT inhaler Inhale 2 puffs into the lungs 2 (two) times daily. 06/24/19   [name],  DO  AMBULATORY NON FORMULARY MEDICATION Medication Name: Glucometer, lancets and strip to test every other day. Dx: Diabetes type 2, E11.9. 100 strip and 100 lancets 08/02/17   Hali Marry, MD  amoxicillin (AMOXIL) 500 MG capsule Take 4 capsules by mouth 30-60 minutes prior to dental procedure. Patient not taking: Reported on 06/12/2019 06/02/19   Herminio Commons, MD  aspirin 81 MG tablet Take 81 mg by mouth daily.    [provider]  atorvastatin (LIPITOR) 40 MG tablet Take 1 tablet (40 mg total) by mouth every evening. 01/20/19   Herminio Commons, MD  benzonatate (TESSALON) 200 MG capsule Take 1 capsule (200 mg total) by mouth 3 (three) times daily as needed for cough. 06/12/19   [name], DO  Fluticasone-Umeclidin-Vilant (TRELEGY ELLIPTA) 100-62.5-25 MCG/INH AEPB Inhale 1 puff into the lungs daily. NEEDS TO SCHEDULE OFFICE VISIT 06/24/19   [name], DO  levofloxacin (LEVAQUIN) 500 MG tablet Take 1 tablet (500 mg total) by mouth daily. 06/24/19   [name], DO  metoprolol succinate (TOPROL-XL) 50 MG 24 hr tablet Take 1 tablet (50 mg total) by mouth daily. Take with or immediately following a meal. 07/31/18   Barrett, Junie Panning  R, PA-C  nitroGLYCERIN (NITROSTAT) 0.4 MG SL tablet Place 1 tablet (0.4 mg total) under the tongue every 5 (five) minutes x 3 doses as needed for chest pain. 12/03/18 01/26/25  Bensimhon, Shaune Pascal, MD  Omega-3 Fatty Acids (FISH OIL) 1200 MG CAPS Take 1,200 mg by mouth daily.    [provider]  oxymetazoline (AFRIN) 0.05 % nasal spray Place 1 spray into both nostrils daily.    [provider]  predniSONE (DELTASONE) 20 MG tablet Take 1 tablet (20 mg total) by mouth 2 (two) times daily with a meal. 06/24/19   [name], DO  sacubitril-valsartan (ENTRESTO) 24-26 MG Take 1 tablet by mouth 2 (two) times daily. 03/06/19   Duke, Tami Lin, PA  sildenafil (REVATIO) 20 MG tablet Take 1 tablet (20 mg total) by mouth 3  (three) times daily. 12/23/18   Bensimhon, Shaune Pascal, MD  spironolactone (ALDACTONE) 25 MG tablet TAKE 1 TABLET(25 MG) BY MOUTH DAILY 06/17/19   Herminio Commons, MD  torsemide (DEMADEX) 20 MG tablet Take 2 tablets (40 mg total) by mouth daily. 08/08/18   Darrick Grinder D, NP  warfarin (COUMADIN) 4 MG tablet Take 1 tablet daily except 1 1/2 tablets on Mondays and Thursdays or as directed 06/02/19   Herminio Commons, MD    Family History Family History  Problem Relation Age of Onset   Heart disease Mother    Heart disease Father     Social History Social History   Tobacco Use   Smoking status: Former Smoker    Packs/day: 2.00    Years: 15.00    Pack years: 30.00    Types: Cigarettes    Quit date: 11/26/1996    Years since quitting: 22.6   Smokeless tobacco: Never Used  Substance Use Topics   Alcohol use: No   Drug use: No     Allergies   Ramipril and Percocet [oxycodone-acetaminophen]   Review of Systems Review of Systems  Respiratory: Positive for shortness of breath.   All other systems reviewed and are negative.    Physical Exam Updated Vital Signs BP (!) 88/52 (BP Location: Left Arm)    Pulse 93    Temp (!) 103.2 F (39.6 C) (Oral)    Resp (!) 30    Ht 5\' 11"  (1.803 m)    Wt 136.1 kg    SpO2 92%    BMI 41.84 kg/m   Physical Exam Vitals signs and nursing note reviewed.  Constitutional:      Appearance: He is well-developed. He is obese.     Comments: Appears uncomfortable but nontoxic  HENT:     Head: Atraumatic.  Eyes:     Conjunctiva/sclera: Conjunctivae normal.  Neck:     Musculoskeletal: Neck supple.  Cardiovascular:     Rate and Rhythm: Normal rate and regular rhythm.  Pulmonary:     Breath sounds: Decreased breath sounds and rhonchi present. No wheezing or rales.  Chest:     Chest wall: No tenderness.  Skin:    General: Skin is warm.     Findings: No rash.  Neurological:     Mental Status: He is alert and oriented to person, place, and  time.  Psychiatric:        Mood and Affect: Mood normal.      ED Treatments / Results  Labs (all labs ordered are listed, but only abnormal results are displayed) Labs Reviewed  CBC WITH DIFFERENTIAL/PLATELET - Abnormal; Notable for the following components:  Result Value   RBC 4.19 (*)    Hemoglobin 12.1 (*)    HCT 36.1 (*)    Neutro Abs 9.3 (*)    Lymphs Abs 0.6 (*)    Abs Immature Granulocytes 0.09 (*)    All other components within normal limits  COMPREHENSIVE METABOLIC PANEL - Abnormal; Notable for the following components:   Sodium 129 (*)    Chloride 91 (*)    BUN 37 (*)    Creatinine, Ser 1.65 (*)    Calcium 8.0 (*)    Albumin 3.3 (*)    AST 116 (*)    ALT 78 (*)    Total Bilirubin 1.5 (*)    GFR calc non Af Amer 44 (*)    GFR calc Af Amer 51 (*)    All other components within normal limits  D-DIMER, QUANTITATIVE (NOT AT [facility]) - Abnormal; Notable for the following components:   D-Dimer, Quant 3.50 (*)    All other components within normal limits  LACTATE DEHYDROGENASE - Abnormal; Notable for the following components:   LDH 906 (*)    All other components within normal limits  TRIGLYCERIDES - Abnormal; Notable for the following components:   Triglycerides 180 (*)    All other components within normal limits  FIBRINOGEN - Abnormal; Notable for the following components:   Fibrinogen >800 (*)    All other components within normal limits  CULTURE, BLOOD (ROUTINE X 2)  CULTURE, BLOOD (ROUTINE X 2)  LACTIC ACID, PLASMA  LACTIC ACID, PLASMA  PROCALCITONIN  FERRITIN  C-REACTIVE PROTEIN    EKG EKG Interpretation  Date/Time:  Wednesday July 08 2019 14:41:11 EDT Ventricular Rate:  93 PR Interval:    QRS Duration: 118 QT Interval:  369 QTC Calculation: 459 R Axis:   113 Text Interpretation:  Sinus or ectopic atrial rhythm Incomplete right bundle branch block Confirmed by Virgel Manifold [phone]) on 07/08/2019 4:37:08 PM   ED ECG REPORT   Date:  07/08/2019  Rate: 93  Rhythm: normal sinus rhythm  QRS Axis: right  Intervals: normal  ST/T Wave abnormalities: normal  Conduction Disutrbances:right bundle branch block  Narrative Interpretation:   Old EKG Reviewed: unchanged  I have personally reviewed the EKG tracing and agree with the computerized printout as noted.   Radiology Dg Chest Port 1 View  Result Date: 07/08/2019 CLINICAL DATA:  Dyspnea.  COVID-19 pneumonia. EXAM: PORTABLE CHEST 1 VIEW COMPARISON:  08/12/2018 chest radiograph. FINDINGS: Intact sternotomy wires. Cardiac valve prosthesis in place. Stable cardiomediastinal silhouette with mild cardiomegaly. No pneumothorax. No pleural effusion. Patchy opacities throughout lungs bilaterally, most prominent in peripheral lower lungs bilaterally. IMPRESSION: Patchy peripheral lower lung opacities bilaterally, compatible with COVID-19 pneumonia. Cardiomegaly. Electronically Signed   By: Ilona Sorrel M.D.   On: 07/08/2019 15:59    Procedures .Critical Care Performed by: Domenic Moras, PA-C Authorized by: Domenic Moras, PA-C   Critical care provider statement:    Critical care time (minutes):  45   Critical care was time spent personally by me on the following activities:  Discussions with consultants, evaluation of patient's response to treatment, examination of patient, ordering and performing treatments and interventions, ordering and review of laboratory studies, ordering and review of radiographic studies, pulse oximetry, re-evaluation of patient's condition, obtaining history from patient or surrogate and review of old charts   (including critical care time)  Medications Ordered in ED Medications  acetaminophen (TYLENOL) tablet 650 mg (has no administration in time range)  sodium chloride 0.9 %  bolus 500 mL (500 mLs Intravenous New Bag/Given 07/08/19 1503)     Initial Impression / Assessment and Plan / ED Course  I have reviewed the triage vital signs and the nursing  notes.  Pertinent labs & imaging results that were available during my care of the patient were reviewed by me and considered in my medical decision making (see chart for details).        BP (!) 88/52 (BP Location: Left Arm)    Pulse 93    Temp (!) 103.2 F (39.6 C) (Oral)    Resp (!) 30    Ht 5\' 11"  (1.803 m)    Wt 136.1 kg    SpO2 92%    BMI 41.84 kg/m    Final Clinical Impressions(s) / ED Diagnoses   Final diagnoses:  Acute respiratory disease due to COVID-19 virus    ED Discharge Orders    None     3:19 PM Patient here with COVID-19 symptoms.  Test positive for COVID-19 in outpatient setting.  He came in with a fever of 103.2, hypotensive with blood pressure of 88/52, tachypneic with respiratory rate of 30 and hypoxic with an O2 sats of 78.  O2 sats improved to 95% on 4 L of supplemental oxygen. Tylenol given for fever. He has decreased breath sounds on exam. Albuterol inhaler provided PRN.  Patient will need to be admitted and transferred to [facility] for further management service infection.  Fortunately at this time he does not require intubation.  Encourage patient to lie prone.  4:14 PM Chest x-ray demonstrate patchy peripheral lower lung opacity bilaterally compatible with COVID-19 pneumonia.  Evidence of cardiomegaly.  Labs remarkable for elevated inflammatory markers such as elevated d-dimer of 3.5, LDH 906, fibrinogen greater than 800, triglyceride 180.  White count is normal at 10.1, normal lactic acid of 1.5 elevated liver enzyme with AST 116, ALT 78.  Mild AKI with creatinine of 1.65.  Care discussed with Dr. Wilson Singer.   Appreciate consultation from tried hospitalist who will see and evaluate patient and will admit patient for further management of his COVID-19 infection.   Domenic Moras, PA-C 07/08/19 1644    Virgel Manifold, MD 07/09/19 [phone]

## 2019-07-08 NOTE — ED Notes (Signed)
ED TO INPATIENT HANDOFF REPORT  ED Nurse Name and Phone #: Mateusz Neilan [phone]  S Name/Age/Gender Jeffrey Larson 63 y.o. male Room/Bed: APA10/APA10  Code Status   Code Status: Prior  Home/SNF/Other Home Patient oriented to: self, place, time and situation Is this baseline? Yes   Triage Complete: Triage complete  Chief Complaint Shortness of Breath  Triage Note PT c/o SOB with any exertion, fever and is known to be Covid positive which resulted today from outpatient lab. PT states his wife is covid positive and is a patient at [facility]. PT 78% on room air and has tachypnea.    Allergies Allergies  Allergen Reactions  . Ramipril Cough    cough  . Percocet [Oxycodone-Acetaminophen]     In ICU it contributed to hallucinations. Can take by itself    Level of Care/Admitting Diagnosis ED Disposition    ED Disposition Condition [facility] Area: [facility] [[id number]]  Level of Care: Progressive [102]  Covid Evaluation: Confirmed COVID Positive  Diagnosis: COVID-19 virus infection [[id number]]  Admitting Physician: Caren Griffins [phone]  Attending Physician: Caren Griffins [phone]  Estimated length of stay: past midnight tomorrow  Certification:: I certify this patient will need inpatient services for at least 2 midnights  PT Class (Do Not Modify): Inpatient [101]  PT Acc Code (Do Not Modify): Private [1]       B Medical/Surgery History Past Medical History:  Diagnosis Date  . Chronic combined systolic and diastolic congestive heart failure ([place])   . COPD with chronic bronchitis ([place])   . Coronary artery disease   . Essential hypertension, benign    Ramipril to losartan Sept 2015   . History of pneumonia    RML on CXR 07/20/14  . Hyperlipidemia   . Longstanding persistent atrial fibrillation   . Mitral stenosis with regurgitation   . Myocardial infarction ([place])   . Pulmonary hypertension ([place])   . Renal disorder     history kidney stone  . S/P Maze operation for atrial fibrillation 07/18/2018   Complete bilateral atrial lesion set using bipolar radiofrequency and cryothermy ablation with clipping of LA appendage  . S/P mitral valve replacement with metallic valve 5/78/4696   Sorin Carbomedics Optiform bileaflet mechanical valve, size 33 mm  . S/P TVR (tricuspid valve repair) 07/18/2018   Edwards mc3 ring annuloplasty, size 30 mm   Past Surgical History:  Procedure Laterality Date  . APPENDECTOMY    . CARDIOVASCULAR STRESS TEST  03/2014   Borderline reversible ischemic changes at the apex.  Normal LV contractility/EF 52%.  . CORONARY STENT PLACEMENT  7/12013  . LEFT HEART CATHETERIZATION WITH CORONARY ANGIOGRAM N/A 06/12/2012   Procedure: LEFT HEART CATHETERIZATION WITH CORONARY ANGIOGRAM;  Surgeon: Clent Demark, MD;  Location: [facility] CATH LAB;  Service: Cardiovascular;  Laterality: N/A;  . MAZE N/A 07/18/2018   Procedure: MAZE;  Surgeon: Rexene Alberts, MD;  Location: [place];  Service: Open Heart Surgery;  Laterality: N/A;  . MITRAL VALVE REPLACEMENT N/A 07/18/2018   Procedure: MITRAL VALVE (MV) REPLACEMENT WITH CARBOMEDICS OPTIFORM MITRAL VALVE SIZE 33.;  Surgeon: Rexene Alberts, MD;  Location: [place];  Service: Open Heart Surgery;  Laterality: N/A;  . PERCUTANEOUS CORONARY STENT INTERVENTION (PCI-S) N/A 06/17/2012   Procedure: PERCUTANEOUS CORONARY STENT INTERVENTION (PCI-S);  Surgeon: Clent Demark, MD;  Location: [facility] CATH LAB;  Service: Cardiovascular;  Laterality: N/A;  . RIGHT/LEFT HEART CATH AND CORONARY ANGIOGRAPHY N/A  06/10/2018   Procedure: RIGHT/LEFT HEART CATH AND CORONARY ANGIOGRAPHY;  Surgeon: Dixie Dials, MD;  Location: [place] CV LAB;  Service: Cardiovascular;  Laterality: N/A;  . TEE WITHOUT CARDIOVERSION N/A 06/10/2018   Procedure: TRANSESOPHAGEAL ECHOCARDIOGRAM (TEE) with bubble study;  Surgeon: Dixie Dials, MD;  Location: [facility] ENDOSCOPY;  Service: Cardiovascular;  Laterality: N/A;   . TEE WITHOUT CARDIOVERSION N/A 07/18/2018   Procedure: TRANSESOPHAGEAL ECHOCARDIOGRAM (TEE);  Surgeon: Rexene Alberts, MD;  Location: [place];  Service: Open Heart Surgery;  Laterality: N/A;  . TRICUSPID VALVE REPLACEMENT N/A 07/18/2018   Procedure: TRICUSPID VALVE REPAIR WITH EDWARDS MC3 TRICUSPID ANNULOPLASTY RING SIZE 30.;  Surgeon: Rexene Alberts, MD;  Location: [place];  Service: Open Heart Surgery;  Laterality: N/A;     A IV Location/Drains/Wounds Patient Lines/Drains/Airways Status   Active Line/Drains/Airways    Name:   Placement date:   Placement time:   Site:   Days:   Peripheral IV 07/08/19 Right Antecubital   07/08/19    1502    Antecubital   less than 1   Peripheral IV 07/08/19 Right Wrist   07/08/19    1523    Wrist   less than 1   Incision (Closed) 07/18/18 Chest   07/18/18    1034     355   Incision (Closed) 07/18/18 Neck Right   07/18/18    2330     355   Pressure Injury 07/29/18  redness and blisters on the buttocks   07/29/18    0800     344   Wound / Incision (Open or Dehisced) 07/19/18 Other (Comment) Back Right discovered tear after removing defib pad   07/19/18    0830    Back   354          Intake/Output Last 24 hours No intake or output data in the 24 hours ending 07/08/19 1958  Labs/Imaging Results for orders placed or performed during the hospital encounter of 07/08/19 (from the past 48 hour(s))  Lactic acid, plasma     Status: None   Collection Time: 07/08/19  2:56 PM  Result Value Ref Range   Lactic Acid, Venous 1.5 0.5 - 1.9 mmol/L    Comment: Performed at [facility], [address], [place], [place] [address]  Blood Culture (routine x 2)     Status: None (Preliminary result)   Collection Time: 07/08/19  2:56 PM   Specimen: Right Antecubital; Blood  Result Value Ref Range   Specimen Description RIGHT ANTECUBITAL    Special Requests      BOTTLES DRAWN AEROBIC AND ANAEROBIC Blood Culture adequate volume Performed at [facility], [address] [place], [place] [address]    Culture PENDING    Report Status PENDING   CBC WITH DIFFERENTIAL     Status: Abnormal   Collection Time: 07/08/19  2:56 PM  Result Value Ref Range   WBC 10.1 4.0 - 10.5 K/uL   RBC 4.19 (L) 4.22 - 5.81 MIL/uL   Hemoglobin 12.1 (L) 13.0 - 17.0 g/dL   HCT 36.1 (L) 39.0 - 52.0 %   MCV 86.2 80.0 - 100.0 fL   MCH 28.9 26.0 - 34.0 pg   MCHC 33.5 30.0 - 36.0 g/dL   RDW 15.3 11.5 - 15.5 %   Platelets 300 150 - 400 K/uL   nRBC 0.0 0.0 - 0.2 %   Neutrophils Relative % 91 %   Neutro Abs 9.3 (H) 1.7 - 7.7 K/uL  Lymphocytes Relative 6 %   Lymphs Abs 0.6 (L) 0.7 - 4.0 K/uL   Monocytes Relative 2 %   Monocytes Absolute 0.2 0.1 - 1.0 K/uL   Eosinophils Relative 0 %   Eosinophils Absolute 0.0 0.0 - 0.5 K/uL   Basophils Relative 0 %   Basophils Absolute 0.0 0.0 - 0.1 K/uL   Immature Granulocytes 1 %   Abs Immature Granulocytes 0.09 (H) 0.00 - 0.07 K/uL    Comment: Performed at [facility], [address] [place], [place] [address]  Comprehensive metabolic panel     Status: Abnormal   Collection Time: 07/08/19  2:56 PM  Result Value Ref Range   Sodium 129 (L) 135 - 145 mmol/L   Potassium 3.8 3.5 - 5.1 mmol/L   Chloride 91 (L) 98 - 111 mmol/L   CO2 25 22 - 32 mmol/L   Glucose, Bld 95 70 - 99 mg/dL   BUN 37 (H) 8 - 23 mg/dL   Creatinine, Ser 1.65 (H) 0.61 - 1.24 mg/dL   Calcium 8.0 (L) 8.9 - 10.3 mg/dL   Total Protein 6.7 6.5 - 8.1 g/dL   Albumin 3.3 (L) 3.5 - 5.0 g/dL   AST 116 (H) 15 - 41 U/L   ALT 78 (H) 0 - 44 U/L   Alkaline Phosphatase 62 38 - 126 U/L   Total Bilirubin 1.5 (H) 0.3 - 1.2 mg/dL   GFR calc non Af Amer 44 (L) >60 mL/min   GFR calc Af Amer 51 (L) >60 mL/min   Anion gap 13 5 - 15    Comment: Performed at [facility], [address] [place], [place] [address]  D-dimer, quantitative     Status: Abnormal   Collection Time: 07/08/19  2:56 PM  Result Value Ref Range   D-Dimer, Quant 3.50 (H) 0.00 - 0.50 ug/mL-FEU    Comment: (NOTE) At the  manufacturer cut-off of 0.50 ug/mL FEU, this assay has been documented to exclude PE with a sensitivity and negative predictive value of 97 to 99%.  At this time, this assay has not been approved by the FDA to exclude DVT/VTE. Results should be correlated with clinical presentation. Performed at [facility], [address], [place], [place] [address]   Procalcitonin     Status: None   Collection Time: 07/08/19  2:56 PM  Result Value Ref Range   Procalcitonin 0.14 ng/mL    Comment:        Interpretation: PCT (Procalcitonin) <= 0.5 ng/mL: Systemic infection (sepsis) is not likely. Local bacterial infection is possible. (NOTE)       Sepsis PCT Algorithm           Lower Respiratory Tract                                      Infection PCT Algorithm    ----------------------------     ----------------------------         PCT < 0.25 ng/mL                PCT < 0.10 ng/mL         Strongly encourage             Strongly discourage   discontinuation of antibiotics    initiation of antibiotics    ----------------------------     -----------------------------       PCT 0.25 - 0.50 ng/mL  PCT 0.10 - 0.25 ng/mL               OR       >80% decrease in PCT            Discourage initiation of                                            antibiotics      Encourage discontinuation           of antibiotics    ----------------------------     -----------------------------         PCT >= 0.50 ng/mL              PCT 0.26 - 0.50 ng/mL               AND        <80% decrease in PCT             Encourage initiation of                                             antibiotics       Encourage continuation           of antibiotics    ----------------------------     -----------------------------        PCT >= 0.50 ng/mL                  PCT > 0.50 ng/mL               AND         increase in PCT                  Strongly encourage                                      initiation of antibiotics     Strongly encourage escalation           of antibiotics                                     -----------------------------                                           PCT <= 0.25 ng/mL                                                 OR                                        > 80% decrease in PCT  Discontinue / Do not initiate                                             antibiotics Performed at [facility], [address], [place], [place] [address]   Lactate dehydrogenase     Status: Abnormal   Collection Time: 07/08/19  2:56 PM  Result Value Ref Range   LDH 906 (H) 98 - 192 U/L    Comment: Performed at [facility], [address], [place], [place] [address]  Fibrinogen     Status: Abnormal   Collection Time: 07/08/19  2:56 PM  Result Value Ref Range   Fibrinogen >800 (H) 210 - 475 mg/dL    Comment: Performed at [facility], [address], [place], [place] [address]  Protime-INR     Status: Abnormal   Collection Time: 07/08/19  2:56 PM  Result Value Ref Range   Prothrombin Time 20.2 (H) 11.4 - 15.2 seconds   INR 1.8 (H) 0.8 - 1.2    Comment: (NOTE) INR goal varies based on device and disease states. Performed at [facility], [address] [place], [place] [address]   Blood Culture (routine x 2)     Status: None (Preliminary result)   Collection Time: 07/08/19  3:29 PM   Specimen: BLOOD RIGHT HAND  Result Value Ref Range   Specimen Description BLOOD RIGHT HAND    Special Requests      BOTTLES DRAWN AEROBIC AND ANAEROBIC Blood Culture adequate volume Performed at [facility], [address], [place], [place] [address]    Culture PENDING    Report Status PENDING   Triglycerides     Status: Abnormal   Collection Time: 07/08/19  3:29 PM  Result Value Ref Range   Triglycerides 180 (H) <150 mg/dL    Comment: Performed at [facility], [address], [place],  [address]   Dg Chest Port 1 View  Result Date: 07/08/2019 CLINICAL DATA:   Dyspnea.  COVID-19 pneumonia. EXAM: PORTABLE CHEST 1 VIEW COMPARISON:  08/12/2018 chest radiograph. FINDINGS: Intact sternotomy wires. Cardiac valve prosthesis in place. Stable cardiomediastinal silhouette with mild cardiomegaly. No pneumothorax. No pleural effusion. Patchy opacities throughout lungs bilaterally, most prominent in peripheral lower lungs bilaterally. IMPRESSION: Patchy peripheral lower lung opacities bilaterally, compatible with COVID-19 pneumonia. Cardiomegaly. Electronically Signed   By: Ilona Sorrel M.D.   On: 07/08/2019 15:59    Pending Labs Unresulted Labs (From admission, onward)    Start     Ordered   07/09/19 0500  Protime-INR  Daily,   R     07/08/19 1957   07/08/19 1453  Ferritin  Once,   STAT     07/08/19 1454   07/08/19 1453  C-reactive protein  Once,   STAT     07/08/19 1454   Signed and Held  HIV antibody (Routine Testing)  Once,   R     Signed and Held   Signed and Held  ABO/Rh  Once,   R     Signed and Held   Signed and Held  CBC with Differential/Platelet  Daily,   R     Signed and Held   Signed and Held  Comprehensive metabolic panel  Daily,   R     Signed and Held   Signed and Held  C-reactive protein  Daily,   R  Signed and Held   Signed and Held  D-dimer, quantitative (not at [facility])  Daily,   R     Signed and Held   Signed and Held  Ferritin  Daily,   R     Signed and Held   Signed and Held  Magnesium  Daily,   R     Signed and Held   Signed and Held  Phosphorus  Daily,   R     Signed and Held          Vitals/Pain Today's Vitals   07/08/19 1700 07/08/19 1715 07/08/19 1734 07/08/19 1825  BP: (!) 84/47     Pulse: 85 80    Resp: (!) 28 20    Temp:   98.8 F (37.1 C)   TempSrc:   Oral   SpO2: 92% 93%    Weight:      Height:      PainSc:    0-No pain    Isolation Precautions Airborne and Contact precautions  Medications Medications  albuterol (VENTOLIN HFA) 108 (90 Base) MCG/ACT inhaler 4 puff (4 puffs Inhalation Given 07/08/19  1617)  dexamethasone (DECADRON) injection 6 mg (6 mg Intravenous Given 07/08/19 1732)  tocilizumab (ACTEMRA) 8 mg/kg = 1,088 mg in sodium chloride 0.9 % 100 mL infusion (has no administration in time range)  0.9 %  sodium chloride infusion ( Intravenous New Bag/Given 07/08/19 1729)  warfarin (COUMADIN) tablet 6 mg (has no administration in time range)  Warfarin - Pharmacist Dosing Inpatient (has no administration in time range)  acetaminophen (TYLENOL) tablet 650 mg (650 mg Oral Given 07/08/19 1520)  sodium chloride 0.9 % bolus 500 mL (0 mLs Intravenous Stopped 07/08/19 1640)    Mobility walks Low fall risk   Focused Assessments    R Recommendations: See Admitting Provider Note  Report given to:   Additional Notes:

## 2019-07-08 NOTE — Telephone Encounter (Signed)
Pt stated he had been trying to call his pcp but never got to speak with anyone. Pt stated that he was having some shortness of breath when he walks. Stated his oxygen saturation was 83, and speaking in phrases. Advised to call 911. He voiced understanding and will be doing that.

## 2019-07-08 NOTE — Progress Notes (Signed)
MEDICATION RELATED CONSULT NOTE - INITIAL   Pharmacy Consult for remdesivir Indication: COVID-19  Allergies  Allergen Reactions  . Ramipril Cough    cough  . Percocet [Oxycodone-Acetaminophen]     In ICU it contributed to hallucinations. Can take by itself   Patient Measurements: Height: 5\' 11"  (180.3 cm) Weight: 300 lb (136.1 kg) IBW/kg (Calculated) : 75.3  Vital Signs: Temp: 98.8 F (37.1 C) (08/12 1734) Temp Source: Oral (08/12 1734) BP: 107/55 (08/12 2000) Pulse Rate: 73 (08/12 2000)   Assessment: 63 yo M presents with SOB. Found to be COVID-19 positive. CXR shows infiltrates and on 4L of [place]. ALT slightly elevated at 78  Plan:  Give remdesivir 200mg  IV x 1, then start remdesivir 100mg  IV x 4 days Monitor clinical progress and ALT  Elenor Quinones, PharmD, BCPS, BCIDP Clinical Pharmacist 07/08/2019 10:36 PM

## 2019-07-08 NOTE — Progress Notes (Signed)
Report received from [facility]. RN.

## 2019-07-09 ENCOUNTER — Other Ambulatory Visit: Payer: Self-pay

## 2019-07-09 DIAGNOSIS — E119 Type 2 diabetes mellitus without complications: Secondary | ICD-10-CM

## 2019-07-09 DIAGNOSIS — I4819 Other persistent atrial fibrillation: Secondary | ICD-10-CM

## 2019-07-09 DIAGNOSIS — I5042 Chronic combined systolic (congestive) and diastolic (congestive) heart failure: Secondary | ICD-10-CM

## 2019-07-09 DIAGNOSIS — I10 Essential (primary) hypertension: Secondary | ICD-10-CM

## 2019-07-09 LAB — CBC WITH DIFFERENTIAL/PLATELET
Abs Immature Granulocytes: 0.1 10*3/uL — ABNORMAL HIGH (ref 0.00–0.07)
Basophils Absolute: 0 10*3/uL (ref 0.0–0.1)
Basophils Relative: 0 %
Eosinophils Absolute: 0 10*3/uL (ref 0.0–0.5)
Eosinophils Relative: 0 %
HCT: 32.3 % — ABNORMAL LOW (ref 39.0–52.0)
Hemoglobin: 10.7 g/dL — ABNORMAL LOW (ref 13.0–17.0)
Immature Granulocytes: 1 %
Lymphocytes Relative: 4 %
Lymphs Abs: 0.4 10*3/uL — ABNORMAL LOW (ref 0.7–4.0)
MCH: 29.4 pg (ref 26.0–34.0)
MCHC: 33.1 g/dL (ref 30.0–36.0)
MCV: 88.7 fL (ref 80.0–100.0)
Monocytes Absolute: 0.1 10*3/uL (ref 0.1–1.0)
Monocytes Relative: 1 %
Neutro Abs: 9.2 10*3/uL — ABNORMAL HIGH (ref 1.7–7.7)
Neutrophils Relative %: 94 %
Platelets: 258 10*3/uL (ref 150–400)
RBC: 3.64 MIL/uL — ABNORMAL LOW (ref 4.22–5.81)
RDW: 15.3 % (ref 11.5–15.5)
WBC: 9.8 10*3/uL (ref 4.0–10.5)
nRBC: 0 % (ref 0.0–0.2)

## 2019-07-09 LAB — C-REACTIVE PROTEIN: CRP: 21.7 mg/dL — ABNORMAL HIGH (ref <1.0)

## 2019-07-09 LAB — PHOSPHORUS: Phosphorus: 3 mg/dL (ref 2.5–4.6)

## 2019-07-09 LAB — COMPREHENSIVE METABOLIC PANEL
ALT: 71 U/L — ABNORMAL HIGH (ref 0–44)
AST: 102 U/L — ABNORMAL HIGH (ref 15–41)
Albumin: 2.9 g/dL — ABNORMAL LOW (ref 3.5–5.0)
Alkaline Phosphatase: 61 U/L (ref 38–126)
Anion gap: 13 (ref 5–15)
BUN: 36 mg/dL — ABNORMAL HIGH (ref 8–23)
CO2: 26 mmol/L (ref 22–32)
Calcium: 7.6 mg/dL — ABNORMAL LOW (ref 8.9–10.3)
Chloride: 94 mmol/L — ABNORMAL LOW (ref 98–111)
Creatinine, Ser: 1.29 mg/dL — ABNORMAL HIGH (ref 0.61–1.24)
GFR calc Af Amer: >60 mL/min (ref >60)
GFR calc non Af Amer: 59 mL/min — ABNORMAL LOW (ref >60)
Glucose, Bld: 138 mg/dL — ABNORMAL HIGH (ref 70–99)
Potassium: 3.8 mmol/L (ref 3.5–5.1)
Sodium: 133 mmol/L — ABNORMAL LOW (ref 135–145)
Total Bilirubin: 1.3 mg/dL — ABNORMAL HIGH (ref 0.3–1.2)
Total Protein: 6.2 g/dL — ABNORMAL LOW (ref 6.5–8.1)

## 2019-07-09 LAB — PROTIME-INR
INR: 1.9 — ABNORMAL HIGH (ref 0.8–1.2)
Prothrombin Time: 21.6 seconds — ABNORMAL HIGH (ref 11.4–15.2)

## 2019-07-09 LAB — FERRITIN: Ferritin: 6943 ng/mL — ABNORMAL HIGH (ref 24–336)

## 2019-07-09 LAB — D-DIMER, QUANTITATIVE: D-Dimer, Quant: 2.92 ug/mL-FEU — ABNORMAL HIGH (ref 0.00–0.50)

## 2019-07-09 LAB — ABO/RH: ABO/RH(D): A POS

## 2019-07-09 LAB — MAGNESIUM: Magnesium: 2.5 mg/dL — ABNORMAL HIGH (ref 1.7–2.4)

## 2019-07-09 LAB — GLUCOSE, CAPILLARY
Glucose-Capillary: 148 mg/dL — ABNORMAL HIGH (ref 70–99)
Glucose-Capillary: 141 mg/dL — ABNORMAL HIGH (ref 70–99)

## 2019-07-09 LAB — HEMOGLOBIN A1C
Hgb A1c MFr Bld: 6.1 % — ABNORMAL HIGH (ref 4.8–5.6)
Mean Plasma Glucose: 128.37 mg/dL

## 2019-07-09 MED ORDER — METOPROLOL SUCCINATE ER 25 MG PO TB24
50.0000 mg | ORAL_TABLET | Freq: Every day | ORAL | Status: DC
  Administered 2019-07-09 – 2019-07-18 (×10): 50 mg via ORAL
  Filled 2019-07-09 (×11): qty 2

## 2019-07-09 MED ORDER — INSULIN ASPART 100 UNIT/ML ~~LOC~~ SOLN
0.0000 [IU] | Freq: Three times a day (TID) | SUBCUTANEOUS | Status: DC
  Administered 2019-07-09 – 2019-07-13 (×8): 2 [IU] via SUBCUTANEOUS
  Administered 2019-07-14 (×2): 3 [IU] via SUBCUTANEOUS
  Administered 2019-07-15: 09:00:00 2 [IU] via SUBCUTANEOUS
  Administered 2019-07-15: 3 [IU] via SUBCUTANEOUS
  Administered 2019-07-15: 2 [IU] via SUBCUTANEOUS
  Administered 2019-07-16: 5 [IU] via SUBCUTANEOUS
  Administered 2019-07-16 – 2019-07-18 (×4): 2 [IU] via SUBCUTANEOUS
  Administered 2019-07-18: 3 [IU] via SUBCUTANEOUS

## 2019-07-09 MED ORDER — WARFARIN SODIUM 10 MG PO TABS
10.0000 mg | ORAL_TABLET | Freq: Once | ORAL | Status: AC
  Administered 2019-07-09: 10 mg via ORAL
  Filled 2019-07-09: qty 1

## 2019-07-09 MED ORDER — INSULIN ASPART 100 UNIT/ML ~~LOC~~ SOLN
4.0000 [IU] | Freq: Three times a day (TID) | SUBCUTANEOUS | Status: DC
  Administered 2019-07-09 – 2019-07-18 (×26): 4 [IU] via SUBCUTANEOUS

## 2019-07-09 MED ORDER — INSULIN ASPART 100 UNIT/ML ~~LOC~~ SOLN
0.0000 [IU] | Freq: Every day | SUBCUTANEOUS | Status: DC
  Administered 2019-07-14: 2 [IU] via SUBCUTANEOUS

## 2019-07-09 MED ORDER — ENOXAPARIN SODIUM 150 MG/ML ~~LOC~~ SOLN
130.0000 mg | Freq: Two times a day (BID) | SUBCUTANEOUS | Status: DC
  Administered 2019-07-09 – 2019-07-10 (×2): 130 mg via SUBCUTANEOUS
  Filled 2019-07-09 (×3): qty 0.87

## 2019-07-09 NOTE — Progress Notes (Signed)
[facility] PROGRESS NOTE    Progress Note  Jeffrey Larson  ZTI:[id number] DOB: 1956/09/16 DOA: 07/08/2019 PCP: [name], DO     Brief Narrative:   Jeffrey Larson is an 63 y.o. male past medical history significant for atrial fibrillation status post Maze procedure, metallic mitral valve replacement, tricuspid repair, essential hypertension obesity chronic diastolic heart failure presents to the hospital with shortness of breath.  The patient was tested positive for COVID-19 was admitted to [facility] now recovering at home, he has been feeling sick for 5 days prior to admission with fever chills fatigue over the last 3 days and getting progressive shortness of breath. Symptoms started 8.8.2020 Assessment/Plan:   Acute respiratory failure with hypoxia (HCC) due to COVID-19 virus infection: Patient has multiple risk factors for acute organ decompensation and poor prognosis including age. He was started on IV Remdesivir and Decadron on 07/08/2019. He was given Actemra on 07/08/2019. Patient has a good baseline functional status. Here is requiring about 4 L of oxygen to keep saturation 92%. Try to keep him prone for at least 16 hours a day. Follow inflammatory markers closely.  Acute kidney injury: With a baseline creatinine less than 1. Likely prerenal azotemia in the setting of torsemide Aldactone and Entresto, will hold these he was started on IV fluids it has now improved.  New transaminitis: Likely due to COVID-19 continued to manage conservatively.  Hypovolemic hyponatremia: Improving with IV fluid hydration.  Essential hypertension, benign Blood pressure seems to be fairly controlled continue current regimen.  COPD with chronic bronchitis ([place]) Asymptomatic currently on steroids no wheezing on physical exam.  History of atrial fibrillation status post Maze procedure valve repair/mechanical mitral valve/  S/P mitral valve replacement with metallic valve +  tricuspid valve repair + maze procedure: Currently rate controlled continue Coumadin, resume metoprolol. INR is subtherapeutic, will start him on Lovenox.  History of combined systolic and diastolic heart failure: Continue metoprolol, hold diuretic and Entresto.  Controlled type 2 diabetes mellitus without complication, without long-term current use of insulin ([place]): Hold oral hypoglycemic agents he was started on oral Decadron which will make his blood glucose erratic.  We will start sliding scale. Check an A1c.    DVT prophylaxis: lovenox and coumadin Family Communication:daughter Disposition Plan/Barrier to D/C: once off oxygen Code Status:     Code Status Orders  (From admission, onward)         Start     Ordered   07/08/19 2226  Full code  Continuous     07/08/19 2225        Code Status History    Date Active Date Inactive Code Status Order ID Comments User Context   07/18/2018 1524 07/31/2018 1626 Full Code [id number]  Rexene Alberts, MD Inpatient   06/10/2018 1352 06/17/2018 1649 Full Code [id number]  Dixie Dials, MD Inpatient   11/12/2016 1713 11/15/2016 1509 Full Code [id number]  Thurnell Lose, MD Inpatient   Advance Care Planning Activity        IV Access:    Peripheral IV   Procedures and diagnostic studies:   Dg Chest Port 1 View  Result Date: 07/08/2019 CLINICAL DATA:  Dyspnea.  COVID-19 pneumonia. EXAM: PORTABLE CHEST 1 VIEW COMPARISON:  08/12/2018 chest radiograph. FINDINGS: Intact sternotomy wires. Cardiac valve prosthesis in place. Stable cardiomediastinal silhouette with mild cardiomegaly. No pneumothorax. No pleural effusion. Patchy opacities throughout lungs bilaterally, most prominent in peripheral lower lungs bilaterally. IMPRESSION: Patchy peripheral lower lung opacities bilaterally, compatible with  COVID-19 pneumonia. Cardiomegaly. Electronically Signed   By: Ilona Sorrel M.D.   On: 07/08/2019 15:59     Medical Consultants:     None.  Anti-Infectives:   None  Subjective:    Jeffrey Larson no complaints feels  Objective:    Vitals:   07/09/19 0113 07/09/19 0300 07/09/19 0400 07/09/19 0525  BP:    97/62  Pulse:  (!) 58 63 67  Resp:  14 17 (!) 23  Temp:    98.7 F (37.1 C)  TempSrc:    Oral  SpO2: (!) 82% 95% 95% (!) 88%  Weight:      Height:       SpO2: (!) 88 % O2 Flow Rate (L/min): 4 L/min   Intake/Output Summary (Last 24 hours) at 07/09/2019 0725 Last data filed at 07/09/2019 0600 Gross per 24 hour  Intake 1420.39 ml  Output 900 ml  Net 520.39 ml   Filed Weights   07/08/19 1443  Weight: 136.1 kg    Exam: General exam: In no acute distress. Respiratory system: Good air movement and diffuse crackles bilaterally Cardiovascular system: S1 & S2 heard, RRR. No JVD. Gastrointestinal system: Abdomen is nondistended, soft and nontender.  Central nervous system: Alert and oriented. No focal neurological deficits. Extremities: No lower extremity edema Skin: No rashes, lesions or ulcers Psychiatry: Judgement and insight appear normal. Mood & affect appropriate.    Data Reviewed:    Labs: Basic Metabolic Panel: Recent Labs  Lab 07/08/19 1456 07/09/19 0155  NA 129* 133*  K 3.8 3.8  CL 91* 94*  CO2 25 26  GLUCOSE 95 138*  BUN 37* 36*  CREATININE 1.65* 1.29*  CALCIUM 8.0* 7.6*  MG  --  2.5*  PHOS  --  3.0   GFR Estimated Creatinine Clearance: 83.6 mL/min (A) (by C-G formula based on SCr of 1.29 mg/dL (H)). Liver Function Tests: Recent Labs  Lab 07/08/19 1456 07/09/19 0155  AST 116* 102*  ALT 78* 71*  ALKPHOS 62 61  BILITOT 1.5* 1.3*  PROT 6.7 6.2*  ALBUMIN 3.3* 2.9*   No results for input(s): LIPASE, AMYLASE in the last 168 hours. No results for input(s): AMMONIA in the last 168 hours. Coagulation profile Recent Labs  Lab 07/08/19 1456  INR 1.8*   COVID-19 Labs  Recent Labs    07/08/19 1456 07/08/19 1529  DDIMER 3.50*  --   FERRITIN  --  6,323*   LDH 906*  --   CRP  --  25.2*    Lab Results  Component Value Date   SARSCOV2NAA Detected (A) 07/06/2019   [place] Not Detected 06/23/2019   [place] Not Detected 06/05/2019    CBC: Recent Labs  Lab 07/08/19 1456 07/09/19 0155  WBC 10.1 9.8  NEUTROABS 9.3* PENDING  HGB 12.1* 10.7*  HCT 36.1* 32.3*  MCV 86.2 88.7  PLT 300 258   Cardiac Enzymes: No results for input(s): CKTOTAL, CKMB, CKMBINDEX, TROPONINI in the last 168 hours. BNP (last 3 results) No results for input(s): PROBNP in the last 8760 hours. CBG: No results for input(s): GLUCAP in the last 168 hours. D-Dimer: Recent Labs    07/08/19 1456  DDIMER 3.50*   Hgb A1c: No results for input(s): HGBA1C in the last 72 hours. Lipid Profile: Recent Labs    07/08/19 1529  TRIG 180*   Thyroid function studies: No results for input(s): TSH, T4TOTAL, T3FREE, THYROIDAB in the last 72 hours.  Invalid input(s): FREET3 Anemia work up: [employer]  07/08/19 1529  FERRITIN 6,323*   Sepsis Labs: Recent Labs  Lab 07/08/19 1456 07/09/19 0155  PROCALCITON 0.14  --   WBC 10.1 9.8  LATICACIDVEN 1.5  --    Microbiology Recent Results (from the past 240 hour(s))  Novel Coronavirus, NAA ([facility])     Status: Abnormal   Collection Time: 07/06/19 12:45 PM   Specimen: Oropharyngeal(OP) collection in vial transport medium  Result Value Ref Range Status   SARS-CoV-2, NAA Detected (A) Not Detected Final    Comment: This test was developed and its performance characteristics determined by [employer]. This test has not been FDA cleared or approved. This test has been authorized by FDA under an Emergency Use Authorization (EUA). This test is only authorized for the duration of time the declaration that circumstances exist justifying the authorization of the emergency use of in vitro diagnostic tests for detection of SARS-CoV-2 virus and/or diagnosis of COVID-19 infection under section 564(b)(1) of  the Act, 21 U.S.C. 258NID-7(O)(2), unless the authorization is terminated or revoked sooner. When diagnostic testing is negative, the possibility of a false negative result should be considered in the context of a patient's recent exposures and the presence of clinical signs and symptoms consistent with COVID-19. An individual without symptoms of COVID-19 and who is not shedding SARS-CoV-2 virus would expect to have a negative (not detected) result in this assay.   Blood Culture (routine x 2)     Status: None (Preliminary result)   Collection Time: 07/08/19  2:56 PM   Specimen: Right Antecubital; Blood  Result Value Ref Range Status   Specimen Description RIGHT ANTECUBITAL  Final   Special Requests   Final    BOTTLES DRAWN AEROBIC AND ANAEROBIC Blood Culture adequate volume Performed at [facility], [address], [place], [place] [address]    Culture PENDING  Incomplete   Report Status PENDING  Incomplete  Blood Culture (routine x 2)     Status: None (Preliminary result)   Collection Time: 07/08/19  3:29 PM   Specimen: BLOOD RIGHT HAND  Result Value Ref Range Status   Specimen Description BLOOD RIGHT HAND  Final   Special Requests   Final    BOTTLES DRAWN AEROBIC AND ANAEROBIC Blood Culture adequate volume Performed at [facility], [address] [place], [place] [address]    Culture PENDING  Incomplete   Report Status PENDING  Incomplete     Medications:    albuterol  2 puff Inhalation Q6H   aspirin EC  81 mg Oral Daily   atorvastatin  40 mg Oral QPM   dexamethasone (DECADRON) injection  6 mg Intravenous Daily   fluticasone furoate-vilanterol  1 puff Inhalation Daily   And   umeclidinium bromide  1 puff Inhalation Daily   Warfarin - Pharmacist Dosing Inpatient   Does not apply q1800   Continuous Infusions:  remdesivir 100 mg in NS 250 mL        LOS: 1 day   Charlynne Cousins  [facility]  07/09/2019, 7:25 AM

## 2019-07-09 NOTE — Progress Notes (Addendum)
ANTICOAGULATION CONSULT NOTE - Follow Up Consult  Pharmacy Consult for warfarin, Lovenox Indication: Afib, mechanical mitral valve replacement  Allergies  Allergen Reactions  . Ramipril Cough    cough  . Percocet [Oxycodone-Acetaminophen]     In ICU it contributed to hallucinations. Can take by itself    Patient Measurements: Height: 5\' 11"  (180.3 cm) Weight: 300 lb (136.1 kg) IBW/kg (Calculated) : 75.3   Vital Signs: Temp: 98 F (36.7 C) (08/13 0726) Temp Source: Oral (08/13 0726) BP: 100/56 (08/13 0726) Pulse Rate: 73 (08/13 0726)  Labs: Recent Labs    07/08/19 1456 07/09/19 0155  HGB 12.1* 10.7*  HCT 36.1* 32.3*  PLT 300 258  LABPROT 20.2* 21.6*  INR 1.8* 1.9*  CREATININE 1.65* 1.29*    Estimated Creatinine Clearance: 83.6 mL/min (A) (by C-G formula based on SCr of 1.29 mg/dL (H)).   Medications:  Scheduled:  . albuterol  2 puff Inhalation Q6H  . aspirin EC  81 mg Oral Daily  . atorvastatin  40 mg Oral QPM  . dexamethasone (DECADRON) injection  6 mg Intravenous Daily  . fluticasone furoate-vilanterol  1 puff Inhalation Daily   And  . umeclidinium bromide  1 puff Inhalation Daily  . Warfarin - Pharmacist Dosing Inpatient   Does not apply q1800   Infusions:  . remdesivir 100 mg in NS 250 mL      Assessment: 34 yoM admitted on 8/12 for COVID-19 pneumonia with PMH of Afib, mitral valve replacement with metallic valve, tricuspid valve repair, hypertension, COPD, CAD, hyperlipidemia, obesity, chronic diastolic CHF.  Pharmacy is consulted to continue warfarin dosing. PTA warfarin: 4mg  daily except 6 mg on Mon/Thursday.  Unknown last dose.  Admission INR 1.8  Today, 07/09/2019: INR 1.9 CBC:  Hgb down to 10.7, Plt 258 No bleeding or complications reported AST elevated but improved to 102; ALT elevated at 71; Tbili 1.3  Goal of Therapy:  INR 2.5-3.5 Monitor platelets by anticoagulation protocol: Yes   Plan:  Lovenox 1 mg/kg SQ q12h while INR is  subtherapeutic. Warfarin 10 mg PO x 1. Daily PT/INR. Monitor for signs and symptoms of bleeding.   Gretta Arab PharmD, BCPS Clinical pharmacist phone 7am- 5pm: [phone] 07/09/2019 12:59 PM

## 2019-07-09 NOTE — Addendum Note (Signed)
Encounter addended by: Dwana Melena, RN on: 07/09/2019 8:30 AM  Actions taken: Clinical Note Signed, Episode resolved

## 2019-07-09 NOTE — Progress Notes (Signed)
Discharge Progress Report  Patient Details  Name: Jeffrey Larson MRN: [id number] Date of Birth: July 15, 1956 Referring Provider:     Hunter from 01/14/2019 in [place]  Referring Provider  [place]       Number of Visits: 13  Reason for Discharge:  Early Exit:  Personal  Smoking History:  Social History   Tobacco Use  Smoking Status Former Smoker  . Packs/day: 2.00  . Years: 15.00  . Pack years: 30.00  . Types: Cigarettes  . Quit date: 11/26/1996  . Years since quitting: 22.6  Smokeless Tobacco Never Used    Diagnosis:  S/P mitral valve replacement  S/P tricuspid valve repair  ADL UCSD:   Initial Exercise Prescription: Initial Exercise Prescription - 01/14/19 1300      Date of Initial Exercise RX and Referring Provider   Date  01/14/19    Referring Provider  Bensimhon    Expected Discharge Date  04/14/19      Treadmill   MPH  1.4    Grade  0    Minutes  17    METs  2.07      Recumbant Elliptical   Level  1    RPM  40    Watts  35    Minutes  17    METs  2      Prescription Details   Frequency (times per week)  3    Duration  Progress to 30 minutes of continuous aerobic without signs/symptoms of physical distress      Intensity   THRR 40-80% of Max Heartrate  110-126-142    Ratings of Perceived Exertion  11-13    Perceived Dyspnea  0-4      Progression   Progression  Continue to progress workloads to maintain intensity without signs/symptoms of physical distress.      Resistance Training   Training Prescription  Yes    Weight  1    Reps  10-15       Discharge Exercise Prescription (Final Exercise Prescription Changes): Exercise Prescription Changes - 06/11/19 1200      Response to Exercise   Blood Pressure (Admit)  106/68    Blood Pressure (Exercise)  144/70    Blood Pressure (Exit)  110/68    Heart Rate (Admit)  73 bpm    Heart Rate (Exercise)  115 bpm    Heart Rate (Exit)  105  bpm    Rating of Perceived Exertion (Exercise)  12    Comments  back after COVID    Duration  Continue with 30 min of aerobic exercise without signs/symptoms of physical distress.    Intensity  THRR unchanged      Progression   Progression  Continue to progress workloads to maintain intensity without signs/symptoms of physical distress.    Average METs  3.25      Resistance Training   Training Prescription  Yes    Weight  3    Reps  10-15      Treadmill   MPH  2.2    Grade  0    Minutes  17    METs  2.68      NuStep   Level  2    SPM  137    Minutes  22    METs  3.82      Home Exercise Plan   Plans to continue exercise at  Home (comment)    Frequency  Add 2  additional days to program exercise sessions.    Initial Home Exercises Provided  01/14/19       Functional Capacity: 6 Minute Walk    Row Name 01/14/19 1346         6 Minute Walk   Phase  Initial     Distance  1300 feet     Walk Time  6 minutes     # of Rest Breaks  0     MPH  2.46     METS  2.88     RPE  9     Perceived Dyspnea   7     VO2 Peak  9.23     Symptoms  No     Resting HR  78 bpm     Resting BP  112/78     Resting Oxygen Saturation   97 %     Exercise Oxygen Saturation  during 6 min walk  97 %     Max Ex. HR  116 bpm     Max Ex. BP  128/74     2 Minute Post BP  114/76        Psychological, QOL, Others - Outcomes: PHQ 2/9: Depression screen [facility] 2/9 01/14/2019 08/02/2017  Decreased Interest 0 0  Down, Depressed, Hopeless 0 0  PHQ - 2 Score 0 0  Altered sleeping 0 -  Tired, decreased energy 0 -  Change in appetite 0 -  Feeling bad or failure about yourself  0 -  Trouble concentrating 0 -  Moving slowly or fidgety/restless 0 -  Suicidal thoughts 0 -  PHQ-9 Score 0 -  Difficult doing work/chores Not difficult at all -    Quality of Life: Quality of Life - 01/14/19 1354      Quality of Life   Select  Quality of Life      Quality of Life Scores   Health/Function Pre  29.6 %     Socioeconomic Pre  28.93 %    Psych/Spiritual Pre  28.29 %    Family Pre  28.8 %    GLOBAL Pre  29.07 %       Personal Goals: Goals established at orientation with interventions provided to work toward goal. Personal Goals and Risk Factors at Admission - 01/14/19 1448      Core Components/Risk Factors/Patient Goals on Admission    Weight Management  Yes    Admit Weight  295 lb 9.6 oz (134.1 kg)    Goal Weight: Short Term  285 lb 9.6 oz (129.5 kg)    Goal Weight: Long Term  275 lb 9.6 oz (125 kg)    Expected Outcomes  Short Term: Continue to assess and modify interventions until short term weight is achieved;Long Term: Adherence to nutrition and physical activity/exercise program aimed toward attainment of established weight goal    Personal Goal Other  Yes    Personal Goal  Lose 50lbs. and keep it off, gain heart health    Intervention  Attend program 3 x week and supplement with at home exercise 2 x week.     Expected Outcomes  Reach personal goals        Personal Goals Discharge: Goals and Risk Factor Review    Row Name 01/28/19 1507 02/16/19 1116 06/15/19 1540         Core Components/Risk Factors/Patient Goals Review   Personal Goals Review  Weight Management/Obesity Lose 50 lbs; get heart healthy.   Weight Management/Obesity Lose 50 lbs long  term. Get heart healthy; keep weight off.   Weight Management/Obesity Lose 50 lbs long term/get heart healthy; keep weight off.     Review  Patient has completed 6 sessions gaining 2 lbs since his orientation visit. He says he has not attended enough to notice any progress. Will continue to monitor for progress.   Patient has completed 10 sessions gaining 7 lbs since last 30 day review. He admits to not taking his diuretic as prescribed especially on weekends due to side effects of frequent urination. He was encouraged to comply with all medications prescribed. He is doing well in the program with progression. He says he does feel stronger and  feels the program is helping him meet his goals. Outpatient cardiac rehab services have been suspended due to the COVID-19 restrictions. Will continue to montior.   Cardiac Rehab closed 02/09/19 and reopened 05/25/19. Patient returned to the program gaining 20 lbs during the closure. He blames his weight gain on inactivity. Patient was sent for COVID-19 testing 06/05/19 due to respiratory symptoms. Test was negative but he can not return until he is symptom free per our Medical directors adivse. Patient has a televisit 06/12/19 with his PCP and was diagnosed with acute bronchitis and COPD exacerbation. He prescribed Prednisone, Levaquin, and Tessalon. Patient was told to call 06/25/19 which is 14 days after his test to see if he is symptom free. Will continue to monitor for progress.     Expected Outcomes  Patient will continue to attend sessions and complete the program meeting his personal goals.   Patient will continue to attend sessions and complete the program meeting his personal goals.   Patient will continue to attend sessions and complete the program meeting his personal goals.         Exercise Goals and Review: Exercise Goals    Row Name 01/14/19 1352             Exercise Goals   Increase Physical Activity  Yes       Intervention  Develop an individualized exercise prescription for aerobic and resistive training based on initial evaluation findings, risk stratification, comorbidities and participant's personal goals.;Provide advice, education, support and counseling about physical activity/exercise needs.       Expected Outcomes  Short Term: Attend rehab on a regular basis to increase amount of physical activity.;Long Term: Add in home exercise to make exercise part of routine and to increase amount of physical activity.;Long Term: Exercising regularly at least 3-5 days a week.       Increase Strength and Stamina  Yes       Intervention  Provide advice, education, support and counseling about  physical activity/exercise needs.;Develop an individualized exercise prescription for aerobic and resistive training based on initial evaluation findings, risk stratification, comorbidities and participant's personal goals.       Expected Outcomes  Short Term: Increase workloads from initial exercise prescription for resistance, speed, and METs.;Short Term: Perform resistance training exercises routinely during rehab and add in resistance training at home;Long Term: Improve cardiorespiratory fitness, muscular endurance and strength as measured by increased METs and functional capacity (6MWT)       Able to understand and use rate of perceived exertion (RPE) scale  Yes       Intervention  Provide education and explanation on how to use RPE scale       Expected Outcomes  Short Term: Able to use RPE daily in rehab to express subjective intensity level;Long Term:  Able to  use RPE to guide intensity level when exercising independently       Able to understand and use Dyspnea scale  Yes       Intervention  Provide education and explanation on how to use Dyspnea scale       Expected Outcomes  Short Term: Able to use Dyspnea scale daily in rehab to express subjective sense of shortness of breath during exertion;Long Term: Able to use Dyspnea scale to guide intensity level when exercising independently       Knowledge and understanding of Target Heart Rate Range (THRR)  Yes       Intervention  Provide education and explanation of THRR including how the numbers were predicted and where they are located for reference       Expected Outcomes  Short Term: Able to state/look up THRR       Able to check pulse independently  Yes       Intervention  Provide education and demonstration on how to check pulse in carotid and radial arteries.;Review the importance of being able to check your own pulse for safety during independent exercise       Expected Outcomes  Short Term: Able to explain why pulse checking is important  during independent exercise;Long Term: Able to check pulse independently and accurately       Understanding of Exercise Prescription  Yes       Intervention  Provide education, explanation, and written materials on patient's individual exercise prescription       Expected Outcomes  Short Term: Able to explain program exercise prescription;Long Term: Able to explain home exercise prescription to exercise independently          Exercise Goals Re-Evaluation: Exercise Goals Re-Evaluation    Row Name 01/27/19 0758 02/16/19 1009 06/15/19 1342         Exercise Goal Re-Evaluation   Exercise Goals Review  Increase Physical Activity;Increase Strength and Stamina;Able to understand and use rate of perceived exertion (RPE) scale;Knowledge and understanding of Target Heart Rate Range (THRR);Able to check pulse independently;Understanding of Exercise Prescription  Increase Physical Activity;Increase Strength and Stamina;Able to understand and use rate of perceived exertion (RPE) scale;Knowledge and understanding of Target Heart Rate Range (THRR);Able to check pulse independently;Understanding of Exercise Prescription  Increase Physical Activity;Increase Strength and Stamina;Able to understand and use rate of perceived exertion (RPE) scale;Knowledge and understanding of Target Heart Rate Range (THRR);Able to check pulse independently;Understanding of Exercise Prescription     Comments  Pt. is new to the program, he has attended 5 sessions so far. He has tolerated the exercise well. We will begin in increase his workloads as tolerated.   Pt. is still fairly new to CR. He has now attended 10 sessions. We have progressed him to 1.9 MPH on the TM and he has tolerated it well.   Pt. was doing well after returning to Cardiac Rehab. He has been out for 2 weeks due to being sick. When he returns we will reevaluate his exercise prescription based on what he can tolerate.     Expected Outcomes  lose weight and develop heart  healthy habits.   lose weight and develop heart healthy habits.   Short: get back in to a regular exercise routine. Long: lose weight and keep it off.        Nutrition & Weight - Outcomes: Pre Biometrics - 01/14/19 1352      Pre Biometrics   Height  5\' 11"  (1.803 m)  Waist Circumference  55 inches    Hip Circumference  46 inches    Waist to Hip Ratio  1.2 %    Triceps Skinfold  15 mm    % Body Fat  39.1 %    Grip Strength  14.8 kg    Single Leg Stand  60 seconds        Nutrition: Nutrition Therapy & Goals - 06/15/19 1540      Nutrition Therapy   RD appointment deferred  Yes      Intervention Plan   Intervention  Nutrition handout(s) given to patient.       Nutrition Discharge: Nutrition Assessments - 01/14/19 1447      MEDFICTS Scores   Pre Score  108       Education Questionnaire Score: Knowledge Questionnaire Score - 01/14/19 1447      Knowledge Questionnaire Score   Pre Score  23/24

## 2019-07-10 ENCOUNTER — Encounter (HOSPITAL_COMMUNITY): Payer: BC Managed Care – PPO

## 2019-07-10 LAB — GLUCOSE, CAPILLARY
Glucose-Capillary: 139 mg/dL — ABNORMAL HIGH (ref 70–99)
Glucose-Capillary: 141 mg/dL — ABNORMAL HIGH (ref 70–99)
Glucose-Capillary: 119 mg/dL — ABNORMAL HIGH (ref 70–99)
Glucose-Capillary: 134 mg/dL — ABNORMAL HIGH (ref 70–99)

## 2019-07-10 LAB — CBC WITH DIFFERENTIAL/PLATELET
Abs Immature Granulocytes: 0.16 10*3/uL — ABNORMAL HIGH (ref 0.00–0.07)
Basophils Absolute: 0 10*3/uL (ref 0.0–0.1)
Basophils Relative: 0 %
Eosinophils Absolute: 0 10*3/uL (ref 0.0–0.5)
Eosinophils Relative: 0 %
HCT: 33.2 % — ABNORMAL LOW (ref 39.0–52.0)
Hemoglobin: 10.8 g/dL — ABNORMAL LOW (ref 13.0–17.0)
Immature Granulocytes: 1 %
Lymphocytes Relative: 4 %
Lymphs Abs: 0.5 10*3/uL — ABNORMAL LOW (ref 0.7–4.0)
MCH: 29.1 pg (ref 26.0–34.0)
MCHC: 32.5 g/dL (ref 30.0–36.0)
MCV: 89.5 fL (ref 80.0–100.0)
Monocytes Absolute: 0.3 10*3/uL (ref 0.1–1.0)
Monocytes Relative: 2 %
Neutro Abs: 13.3 10*3/uL — ABNORMAL HIGH (ref 1.7–7.7)
Neutrophils Relative %: 93 %
Platelets: 343 10*3/uL (ref 150–400)
RBC: 3.71 MIL/uL — ABNORMAL LOW (ref 4.22–5.81)
RDW: 15.1 % (ref 11.5–15.5)
WBC: 14.3 10*3/uL — ABNORMAL HIGH (ref 4.0–10.5)
nRBC: 0 % (ref 0.0–0.2)

## 2019-07-10 LAB — COMPREHENSIVE METABOLIC PANEL
ALT: 79 U/L — ABNORMAL HIGH (ref 0–44)
AST: 105 U/L — ABNORMAL HIGH (ref 15–41)
Albumin: 2.8 g/dL — ABNORMAL LOW (ref 3.5–5.0)
Alkaline Phosphatase: 64 U/L (ref 38–126)
Anion gap: 9 (ref 5–15)
BUN: 36 mg/dL — ABNORMAL HIGH (ref 8–23)
CO2: 27 mmol/L (ref 22–32)
Calcium: 8.2 mg/dL — ABNORMAL LOW (ref 8.9–10.3)
Chloride: 97 mmol/L — ABNORMAL LOW (ref 98–111)
Creatinine, Ser: 0.94 mg/dL (ref 0.61–1.24)
GFR calc Af Amer: >60 mL/min (ref >60)
GFR calc non Af Amer: >60 mL/min (ref >60)
Glucose, Bld: 126 mg/dL — ABNORMAL HIGH (ref 70–99)
Potassium: 4.3 mmol/L (ref 3.5–5.1)
Sodium: 133 mmol/L — ABNORMAL LOW (ref 135–145)
Total Bilirubin: 1.1 mg/dL (ref 0.3–1.2)
Total Protein: 6.2 g/dL — ABNORMAL LOW (ref 6.5–8.1)

## 2019-07-10 LAB — D-DIMER, QUANTITATIVE: D-Dimer, Quant: 2.12 ug/mL-FEU — ABNORMAL HIGH (ref 0.00–0.50)

## 2019-07-10 LAB — HIV ANTIBODY (ROUTINE TESTING W REFLEX): HIV Screen 4th Generation wRfx: NONREACTIVE

## 2019-07-10 LAB — PHOSPHORUS: Phosphorus: 2.5 mg/dL (ref 2.5–4.6)

## 2019-07-10 LAB — MAGNESIUM: Magnesium: 2.6 mg/dL — ABNORMAL HIGH (ref 1.7–2.4)

## 2019-07-10 LAB — PROTIME-INR
INR: 3.6 — ABNORMAL HIGH (ref 0.8–1.2)
Prothrombin Time: 35.4 seconds — ABNORMAL HIGH (ref 11.4–15.2)

## 2019-07-10 LAB — LACTATE DEHYDROGENASE: LDH: 949 U/L — ABNORMAL HIGH (ref 98–192)

## 2019-07-10 LAB — FERRITIN: Ferritin: >7500 ng/mL — ABNORMAL HIGH (ref 24–336)

## 2019-07-10 LAB — C-REACTIVE PROTEIN: CRP: 14.2 mg/dL — ABNORMAL HIGH (ref <1.0)

## 2019-07-10 MED ORDER — DEXAMETHASONE SODIUM PHOSPHATE 10 MG/ML IJ SOLN
6.0000 mg | Freq: Two times a day (BID) | INTRAMUSCULAR | Status: DC
  Administered 2019-07-10 – 2019-07-16 (×13): 6 mg via INTRAVENOUS
  Filled 2019-07-10 (×13): qty 1

## 2019-07-10 NOTE — Progress Notes (Signed)
ANTICOAGULATION CONSULT NOTE - Follow Up Consult  Pharmacy Consult for warfarin Indication: Afib, mechanical mitral valve replacement  Allergies  Allergen Reactions  . Ramipril Cough    cough  . Percocet [Oxycodone-Acetaminophen]     In ICU it contributed to hallucinations. Can take by itself    Patient Measurements: Height: 5\' 11"  (180.3 cm) Weight: 300 lb (136.1 kg) IBW/kg (Calculated) : 75.3   Vital Signs: Temp: 98 F (36.7 C) (08/14 0619) Temp Source: Oral (08/14 0619) BP: 109/61 (08/14 0619) Pulse Rate: 73 (08/14 0619)  Labs: Recent Labs    07/08/19 1456 07/09/19 0155 07/10/19 0243  HGB 12.1* 10.7* 10.8*  HCT 36.1* 32.3* 33.2*  PLT 300 258 343  LABPROT 20.2* 21.6* 35.4*  INR 1.8* 1.9* 3.6*  CREATININE 1.65* 1.29* 0.94    Estimated Creatinine Clearance: 114.8 mL/min (by C-G formula based on SCr of 0.94 mg/dL).   Medications:  Scheduled:  . albuterol  2 puff Inhalation Q6H  . aspirin EC  81 mg Oral Daily  . atorvastatin  40 mg Oral QPM  . dexamethasone (DECADRON) injection  6 mg Intravenous Daily  . enoxaparin (LOVENOX) injection  130 mg Subcutaneous Q12H  . fluticasone furoate-vilanterol  1 puff Inhalation Daily   And  . umeclidinium bromide  1 puff Inhalation Daily  . insulin aspart  0-15 Units Subcutaneous TID WC  . insulin aspart  0-5 Units Subcutaneous QHS  . insulin aspart  4 Units Subcutaneous TID WC  . metoprolol succinate  50 mg Oral Daily  . Warfarin - Pharmacist Dosing Inpatient   Does not apply q1800   Infusions:  . remdesivir 100 mg in NS 250 mL Stopped (07/09/19 2203)    Assessment: 73 yoM admitted on 8/12 for COVID-19 pneumonia with PMH of Afib, mitral valve replacement with metallic valve, tricuspid valve repair, hypertension, COPD, CAD, hyperlipidemia, obesity, chronic diastolic CHF.  Pharmacy is consulted to continue warfarin dosing. PTA warfarin: 4mg  daily except 6 mg on Mon/Thursday.  Unknown last dose.  Admission INR  1.8  Today, 07/10/2019: INR significantly increased from 1.9 to 3.6, supratherapeutic after only 2 warfarin doses. CBC:  Hgb low/stable at 10.8, Plt 343. No bleeding or complications reported AST elevated at 105; ALT elevated at 79; Tbili decreased to 1.1 SCr improved to 0.94 D-dimer: 3.5, 2.9, 2.12  Goal of Therapy:  INR 2.5-3.5 Monitor platelets by anticoagulation protocol: Yes   Plan:  D/C Lovenox Hold warfarin today. Daily PT/INR. Monitor for signs and symptoms of bleeding.   Gretta Arab PharmD, BCPS Clinical pharmacist phone 7am- 5pm: [phone] 07/10/2019 8:00 AM

## 2019-07-10 NOTE — Evaluation (Signed)
Physical Therapy Evaluation Patient Details Name: Jeffrey Larson MRN: [id number] DOB: 11/05/1956 Today's Date: 07/10/2019   History of Present Illness  ELISE GLADDEN is an 63 y.o. male past medical history : atrial fibrillation , metallic mitral valve replacement, tricuspid repair, essential hypertension ,obesity, chronic diastolic heart failure presents to the hospital 07/08/19 with shortness of breath, hypoxia,fever chills,Positive for covid-19 07/06/19,recovering at home,  Clinical Impression  The patient motivated to ambulate. Pt. On 15 L HFNC. Ambulated x 150' in room. SaO2 lowest 81%, HR 69, RR 34. encouraged pursed lip breaths and to slow cadence. The patient tolerated well. Patient returning to prone after ambulation. Pt admitted with above diagnosis. Pt currently with functional limitations due to the deficits listed below (see PT Problem List). Pt will benefit from skilled PT to increase their independence and safety with mobility to allow discharge to the venue listed below.        Follow Up Recommendations No PT follow up    Equipment Recommendations  None recommended by PT    Recommendations for Other Services       Precautions / Restrictions Precautions Precaution Comments: on 15 L HFNC      Mobility  Bed Mobility Overal bed mobility: Independent                Transfers Overall transfer level: Independent                  Ambulation/Gait Ambulation/Gait assistance: Supervision Gait Distance (Feet): 180 Feet Assistive device: None Gait Pattern/deviations: WFL(Within Functional Limits) Gait velocity: decr   General Gait Details: pt required no physical assist, just  for lines and O2 tank  Stairs            Wheelchair Mobility    Modified Rankin (Stroke Patients Only)       Balance Overall balance assessment: No apparent balance deficits (not formally assessed)                                            Pertinent Vitals/Pain      Home Living Family/patient expects to be discharged to:: Private residence Living Arrangements: Spouse/significant other Available Help at Discharge: Family Type of Home: Mobile home Home Access: Stairs to enter Entrance Stairs-Rails: Left Entrance Stairs-Number of Steps: 3 Home Layout: One level Home Equipment: Environmental consultant - 2 wheels      Prior Function Level of Independence: Independent               Hand Dominance        Extremity/Trunk Assessment   Upper Extremity Assessment Upper Extremity Assessment: Overall WFL for tasks assessed    Lower Extremity Assessment Lower Extremity Assessment: Overall WFL for tasks assessed    Cervical / Trunk Assessment Cervical / Trunk Assessment: Normal  Communication   Communication: No difficulties  Cognition Arousal/Alertness: Awake/alert Behavior During Therapy: WFL for tasks assessed/performed Overall Cognitive Status: Within Functional Limits for tasks assessed                                        General Comments      Exercises Other Exercises Other Exercises: pursed lip breaths   Assessment/Plan    PT Assessment Patient needs continued PT services  PT Problem List Cardiopulmonary status limiting  activity;Decreased activity tolerance;Decreased mobility       PT Treatment Interventions Gait training;Therapeutic activities;Functional mobility training    PT Goals (Current goals can be found in the Care Plan section)  Acute Rehab PT Goals Patient Stated Goal: to breathe better, go home PT Goal Formulation: With patient Time For Goal Achievement: 07/24/19 Potential to Achieve Goals: Good    Frequency Min 3X/week   Barriers to discharge        Co-evaluation               AM-PAC PT "6 Clicks" Mobility  Outcome Measure Help needed turning from your back to your side while in a flat bed without using bedrails?: None Help needed moving from lying on  your back to sitting on the side of a flat bed without using bedrails?: None Help needed moving to and from a bed to a chair (including a wheelchair)?: None Help needed standing up from a chair using your arms (e.g., wheelchair or bedside chair)?: None Help needed to walk in hospital room?: A Little Help needed climbing 3-5 steps with a railing? : A Little 6 Click Score: 22    End of Session Equipment Utilized During Treatment: Oxygen Activity Tolerance: Patient tolerated treatment well Patient left: in bed(going back to prone) Nurse Communication: Mobility status PT Visit Diagnosis: Difficulty in walking, not elsewhere classified (R26.2)    Time: 1245-8099 PT Time Calculation (min) (ACUTE ONLY): 25 min   Charges:   PT Evaluation $PT Eval Low Complexity: 1 Low PT Treatments $Gait Training: 8-22 mins        [place]  Office [phone]   Claretha Cooper 07/10/2019, 4:39 PM

## 2019-07-10 NOTE — Progress Notes (Signed)
[facility] PROGRESS NOTE    Progress Note  ESHAAN Larson  OHY:[id number] DOB: 1956/04/01 DOA: 07/08/2019 PCP: [name], [facility]     Brief Narrative:   TRAVAS SCHEXNAYDER is an 63 y.o. male past medical history significant for atrial fibrillation status post Maze procedure, metallic mitral valve replacement, tricuspid repair, essential hypertension obesity chronic diastolic heart failure presents to the hospital with shortness of breath.  The patient was tested positive for COVID-19 was admitted to [facility] now recovering at home, he has been feeling sick for 5 days prior to admission with fever chills fatigue over the last 3 days and getting progressive shortness of breath. Symptoms started 8.8.2020 Assessment/Plan:   Acute respiratory failure with hypoxia (HCC) due to COVID-19 virus infection: Patient has multiple risk factors for acute organ decompensation and poor prognosis including age. IV Remdesivir and oral Decadron, he was given IV Actemra on 07/08/2019. Has good baseline functional status. He is now requiring 15 L to keep saturations above 92%. Try to keep the patient prone for at least 16 hours a day, his inflammatory markers are trending down  Acute kidney injury: With a baseline creatinine less than 1. Prerenal azotemia in the setting of torsemide Aldactone and Entresto. His creatinine has returned to baseline.  KVO IV fluids.  New transaminitis: Due to COVID-19 infection continue conservative management. Likely due to COVID-19 continued to manage conservatively.  Hypovolemic hyponatremia: Improved with IV fluids.  Essential hypertension, benign Blood pressure is stable continue current regimen.  COPD with chronic bronchitis ([place]) Asymptomatic currently on steroids no wheezing on physical exam.  History of atrial fibrillation status post Maze procedure valve repair/mechanical mitral valve/  S/P mitral valve replacement with metallic valve + tricuspid valve  repair + maze procedure: Currently rate controlled continue Coumadin, resume metoprolol. INR today is therapeutic we overlap with Coumadin for 24 hours  History of combined systolic and diastolic heart failure: Continue metoprolol, hold diuretic and Entresto.  Controlled type 2 diabetes mellitus without complication, without long-term current use of insulin ([place]): A1c is 6.5, fairly controlled, continue sliding scale. Continue to hold oral hypoglycemic agents.  DVT prophylaxis: lovenox and coumadin [facility] Communication:daughter Disposition Plan/Barrier to D/C: once off oxygen Code Status:     Code Status Orders  (From admission, onward)         Start     Ordered   07/08/19 2226  Full code  Continuous     07/08/19 2225        Code Status History    Date Active Date Inactive Code Status Order ID Comments User Context   07/18/2018 1524 07/31/2018 1626 Full Code [id number]  Rexene Alberts, MD Inpatient   06/10/2018 1352 06/17/2018 1649 Full Code [id number]  Dixie Dials, MD Inpatient   11/12/2016 1713 11/15/2016 1509 Full Code [id number]  Thurnell Lose, MD Inpatient   Advance Care Planning Activity        IV Access:    Peripheral IV   Procedures and diagnostic studies:   Dg Chest Port 1 View  Result Date: 07/08/2019 CLINICAL DATA:  Dyspnea.  COVID-19 pneumonia. EXAM: PORTABLE CHEST 1 VIEW COMPARISON:  08/12/2018 chest radiograph. FINDINGS: Intact sternotomy wires. Cardiac valve prosthesis in place. Stable cardiomediastinal silhouette with mild cardiomegaly. No pneumothorax. No pleural effusion. Patchy opacities throughout lungs bilaterally, most prominent in peripheral lower lungs bilaterally. IMPRESSION: Patchy peripheral lower lung opacities bilaterally, compatible with COVID-19 pneumonia. Cardiomegaly. Electronically Signed   By: Ilona Sorrel M.D.   On: 07/08/2019  15:59     Medical Consultants:    None.  Anti-Infectives:   None  Subjective:    Frazier Butt  Azam no complaints he relates he feels better today than yesterday.  Objective:    Vitals:   07/09/19 2359 07/10/19 0105 07/10/19 0123 07/10/19 0619  BP:    109/61  Pulse:    73  Resp: (!) 22 20    Temp:    98 F (36.7 C)  TempSrc:    Oral  SpO2: (!) 73% (!) 81% (!) 72% 92%  Weight:      Height:       SpO2: 92 % O2 Flow Rate (L/min): 15 L/min   Intake/Output Summary (Last 24 hours) at 07/10/2019 0811 Last data filed at 07/10/2019 0600 Gross per 24 hour  Intake 840 ml  Output 1700 ml  Net -860 ml   Filed Weights   07/08/19 1443  Weight: 136.1 kg    Exam: General exam: In no acute distress. Respiratory system: Good air movement and diffuse crackles Cardiovascular system: S1 & S2 heard, RRR. No JVD. Gastrointestinal system: Abdomen is nondistended, soft and nontender.  Central nervous system: Alert and oriented. No focal neurological deficits. Extremities: No pedal edema. Skin: No rashes, lesions Jeffrey ulcers Psychiatry: Judgement and insight appear normal. Mood & affect appropriate.    Data Reviewed:    Labs: Basic Metabolic Panel: Recent Labs  Lab 07/08/19 1456 07/09/19 0155 07/10/19 0243  NA 129* 133* 133*  K 3.8 3.8 4.3  CL 91* 94* 97*  CO2 25 26 27   GLUCOSE 95 138* 126*  BUN 37* 36* 36*  CREATININE 1.65* 1.29* 0.94  CALCIUM 8.0* 7.6* 8.2*  MG  --  2.5* 2.6*  PHOS  --  3.0 2.5   GFR Estimated Creatinine Clearance: 114.8 mL/min (by C-G formula based on SCr of 0.94 mg/dL). Liver Function Tests: Recent Labs  Lab 07/08/19 1456 07/09/19 0155 07/10/19 0243  AST 116* 102* 105*  ALT 78* 71* 79*  ALKPHOS 62 61 64  BILITOT 1.5* 1.3* 1.1  PROT 6.7 6.2* 6.2*  ALBUMIN 3.3* 2.9* 2.8*   No results for input(s): LIPASE, AMYLASE in the last 168 hours. No results for input(s): AMMONIA in the last 168 hours. Coagulation profile Recent Labs  Lab 07/08/19 1456 07/09/19 0155 07/10/19 0243  INR 1.8* 1.9* 3.6*   COVID-19 Labs  Recent Labs     07/08/19 1456 07/08/19 1529 07/09/19 0155 07/10/19 0243  DDIMER 3.50*  --  2.92* 2.12*  FERRITIN  --  6,323* 6,943* >7,500*  LDH 906*  --   --   --   CRP  --  25.2* 21.7* 14.2*    Lab Results  Component Value Date   SARSCOV2NAA Detected (A) 07/06/2019   [place] Not Detected 06/23/2019   [place] Not Detected 06/05/2019    CBC: Recent Labs  Lab 07/08/19 1456 07/09/19 0155 07/10/19 0243  WBC 10.1 9.8 14.3*  NEUTROABS 9.3* 9.2* 13.3*  HGB 12.1* 10.7* 10.8*  HCT 36.1* 32.3* 33.2*  MCV 86.2 88.7 89.5  PLT 300 258 343   Cardiac Enzymes: No results for input(s): CKTOTAL, CKMB, CKMBINDEX, TROPONINI in the last 168 hours. BNP (last 3 results) No results for input(s): PROBNP in the last 8760 hours. CBG: Recent Labs  Lab 07/09/19 1641 07/09/19 2049  GLUCAP 148* 141*   D-Dimer: Recent Labs    07/09/19 0155 07/10/19 0243  DDIMER 2.92* 2.12*   Hgb A1c: Recent Labs    07/09/19 0155  HGBA1C 6.1*   Lipid Profile: Recent Labs    07/08/19 1529  TRIG 180*   Thyroid function studies: No results for input(s): TSH, T4TOTAL, T3FREE, THYROIDAB in the last 72 hours.  Invalid input(s): FREET3 Anemia work up: Recent Labs    07/09/19 0155 07/10/19 0243  FERRITIN 6,943* >7,500*   Sepsis Labs: Recent Labs  Lab 07/08/19 1456 07/09/19 0155 07/10/19 0243  PROCALCITON 0.14  --   --   WBC 10.1 9.8 14.3*  LATICACIDVEN 1.5  --   --    Microbiology Recent Results (from the past 240 hour(s))  Novel Coronavirus, NAA ([facility])     Status: Abnormal   Collection Time: 07/06/19 12:45 PM   Specimen: Oropharyngeal(OP) collection in vial transport medium  Result Value Ref Range Status   SARS-CoV-2, NAA Detected (A) Not Detected Final    Comment: This test was developed and its performance characteristics determined by [employer]. This test has not been FDA cleared [facility] approved. This test has been authorized by FDA under an Emergency Use Authorization (EUA).  This test is only authorized for the duration of time the declaration that circumstances exist justifying the authorization of the emergency use of in vitro diagnostic tests for detection of SARS-CoV-2 virus and/[facility]rey diagnosis of COVID-19 infection under section 564(b)(1) of the Act, 21 U.S.C. 037CWU-8(Q)(9), unless the authorization is terminated Jeffrey revoked sooner. When diagnostic testing is negative, the possibility of a false negative result should be considered in the context of a patient's recent exposures and the presence of clinical signs and symptoms consistent with COVID-19. An individual without symptoms of COVID-19 and who is not shedding SARS-CoV-2 virus would expect to have a negative (not detected) result in this assay.   Blood Culture (routine x 2)     Status: None (Preliminary result)   Collection Time: 07/08/19  2:56 PM   Specimen: Right Antecubital; Blood  Result Value Ref Range Status   Specimen Description RIGHT ANTECUBITAL  Final   Special Requests   Final    BOTTLES DRAWN AEROBIC AND ANAEROBIC Blood Culture adequate volume   Culture   Final    NO GROWTH < 24 HOURS Performed at [facility], [address] [place], [place] [address]    Report Status PENDING  Incomplete  Blood Culture (routine x 2)     Status: None (Preliminary result)   Collection Time: 07/08/19  3:29 PM   Specimen: BLOOD RIGHT HAND  Result Value Ref Range Status   Specimen Description BLOOD RIGHT HAND  Final   Special Requests   Final    BOTTLES DRAWN AEROBIC AND ANAEROBIC Blood Culture adequate volume   Culture   Final    NO GROWTH < 24 HOURS Performed at [facility], [address], [place], [place] [address]    Report Status PENDING  Incomplete     Medications:   . albuterol  2 puff Inhalation Q6H  . aspirin EC  81 mg Oral Daily  . atorvastatin  40 mg Oral QPM  . dexamethasone (DECADRON) injection  6 mg Intravenous Daily  . fluticasone furoate-vilanterol  1 puff Inhalation Daily    And  . umeclidinium bromide  1 puff Inhalation Daily  . insulin aspart  0-15 Units Subcutaneous TID WC  . insulin aspart  0-5 Units Subcutaneous QHS  . insulin aspart  4 Units Subcutaneous TID WC  . metoprolol succinate  50 mg Oral Daily  . Warfarin - Pharmacist Dosing Inpatient   Does not apply [phone]  Continuous Infusions: . remdesivir 100 mg in NS 250 mL Stopped (07/09/19 2203)      LOS: 2 days   Charlynne Cousins  [facility]  07/10/2019, 8:11 AM

## 2019-07-10 NOTE — Progress Notes (Signed)
Spoke with pt primary contact [facility] Larson and updated on pt condition. Answered all questions and concerns.

## 2019-07-10 NOTE — Progress Notes (Signed)
   07/09/19 2359  Vitals  Resp (!) 22  Oxygen Therapy  SpO2 (!) 73 %   Tele notified writer patient sats in 70's, observed patient lying in bed prone, labored respirations.  10L HLNC  applied. Patient oxygen level increasing slowly, 91% at this time will continue to monitor.

## 2019-07-10 NOTE — Progress Notes (Signed)
Pt notified RN that oxygen tubing did not feel like O2 was flowing after RN helped transfer him to chair. Pt O2 sats dropped to 63% and O2 tubing appeared to be occluded. Placed pt on nonrebreather instead and replaced O2 tubing. Pt recovered to 91% and placed back on HFNC.

## 2019-07-11 LAB — COMPREHENSIVE METABOLIC PANEL
ALT: 87 U/L — ABNORMAL HIGH (ref 0–44)
AST: 102 U/L — ABNORMAL HIGH (ref 15–41)
Albumin: 2.8 g/dL — ABNORMAL LOW (ref 3.5–5.0)
Alkaline Phosphatase: 68 U/L (ref 38–126)
Anion gap: 11 (ref 5–15)
BUN: 31 mg/dL — ABNORMAL HIGH (ref 8–23)
CO2: 26 mmol/L (ref 22–32)
Calcium: 8.4 mg/dL — ABNORMAL LOW (ref 8.9–10.3)
Chloride: 98 mmol/L (ref 98–111)
Creatinine, Ser: 0.96 mg/dL (ref 0.61–1.24)
GFR calc Af Amer: >60 mL/min (ref >60)
GFR calc non Af Amer: >60 mL/min (ref >60)
Glucose, Bld: 150 mg/dL — ABNORMAL HIGH (ref 70–99)
Potassium: 4.6 mmol/L (ref 3.5–5.1)
Sodium: 135 mmol/L (ref 135–145)
Total Bilirubin: 0.6 mg/dL (ref 0.3–1.2)
Total Protein: 6.3 g/dL — ABNORMAL LOW (ref 6.5–8.1)

## 2019-07-11 LAB — GLUCOSE, CAPILLARY
Glucose-Capillary: 132 mg/dL — ABNORMAL HIGH (ref 70–99)
Glucose-Capillary: 127 mg/dL — ABNORMAL HIGH (ref 70–99)
Glucose-Capillary: 116 mg/dL — ABNORMAL HIGH (ref 70–99)
Glucose-Capillary: 170 mg/dL — ABNORMAL HIGH (ref 70–99)

## 2019-07-11 LAB — CBC WITH DIFFERENTIAL/PLATELET
Abs Immature Granulocytes: 0.39 10*3/uL — ABNORMAL HIGH (ref 0.00–0.07)
Basophils Absolute: 0 10*3/uL (ref 0.0–0.1)
Basophils Relative: 0 %
Eosinophils Absolute: 0 10*3/uL (ref 0.0–0.5)
Eosinophils Relative: 0 %
HCT: 34.6 % — ABNORMAL LOW (ref 39.0–52.0)
Hemoglobin: 11.4 g/dL — ABNORMAL LOW (ref 13.0–17.0)
Immature Granulocytes: 4 %
Lymphocytes Relative: 4 %
Lymphs Abs: 0.3 10*3/uL — ABNORMAL LOW (ref 0.7–4.0)
MCH: 29.8 pg (ref 26.0–34.0)
MCHC: 32.9 g/dL (ref 30.0–36.0)
MCV: 90.3 fL (ref 80.0–100.0)
Monocytes Absolute: 0.2 10*3/uL (ref 0.1–1.0)
Monocytes Relative: 2 %
Neutro Abs: 8.4 10*3/uL — ABNORMAL HIGH (ref 1.7–7.7)
Neutrophils Relative %: 90 %
Platelets: 391 10*3/uL (ref 150–400)
RBC: 3.83 MIL/uL — ABNORMAL LOW (ref 4.22–5.81)
RDW: 14.9 % (ref 11.5–15.5)
WBC: 9.3 10*3/uL (ref 4.0–10.5)
nRBC: 0 % (ref 0.0–0.2)

## 2019-07-11 LAB — MAGNESIUM: Magnesium: 2.3 mg/dL (ref 1.7–2.4)

## 2019-07-11 LAB — C-REACTIVE PROTEIN: CRP: 8.7 mg/dL — ABNORMAL HIGH (ref <1.0)

## 2019-07-11 LAB — D-DIMER, QUANTITATIVE: D-Dimer, Quant: 1.75 ug/mL-FEU — ABNORMAL HIGH (ref 0.00–0.50)

## 2019-07-11 LAB — FERRITIN: Ferritin: 4902 ng/mL — ABNORMAL HIGH (ref 24–336)

## 2019-07-11 LAB — PROTIME-INR
INR: 4.4 (ref 0.8–1.2)
Prothrombin Time: 41.2 seconds — ABNORMAL HIGH (ref 11.4–15.2)

## 2019-07-11 LAB — LACTATE DEHYDROGENASE: LDH: 763 U/L — ABNORMAL HIGH (ref 98–192)

## 2019-07-11 LAB — PHOSPHORUS: Phosphorus: 3.1 mg/dL (ref 2.5–4.6)

## 2019-07-11 MED ORDER — SPIRONOLACTONE 25 MG PO TABS
25.0000 mg | ORAL_TABLET | Freq: Every day | ORAL | Status: DC
  Administered 2019-07-11: 25 mg via ORAL
  Filled 2019-07-11 (×2): qty 1

## 2019-07-11 MED ORDER — TORSEMIDE 20 MG PO TABS
40.0000 mg | ORAL_TABLET | Freq: Every day | ORAL | Status: DC
  Administered 2019-07-11: 40 mg via ORAL
  Filled 2019-07-11 (×2): qty 2

## 2019-07-11 NOTE — Progress Notes (Signed)
ANTICOAGULATION CONSULT NOTE - Follow Up Consult  Pharmacy Consult for warfarin Indication: Afib, mechanical mitral valve replacement  Allergies  Allergen Reactions  . Ramipril Cough    cough  . Percocet [Oxycodone-Acetaminophen]     In ICU it contributed to hallucinations. Can take by itself    Patient Measurements: Height: 5\' 11"  (180.3 cm) Weight: 300 lb (136.1 kg) IBW/kg (Calculated) : 75.3   Vital Signs: Temp: 98.7 F (37.1 C) (08/15 0500) Temp Source: Oral (08/15 0500) BP: 110/58 (08/15 0500) Pulse Rate: 50 (08/15 0500)  Labs: Recent Labs    07/09/19 0155 07/10/19 0243 07/11/19 0030  HGB 10.7* 10.8* 11.4*  HCT 32.3* 33.2* 34.6*  PLT 258 343 391  LABPROT 21.6* 35.4* 41.2*  INR 1.9* 3.6* 4.4*  CREATININE 1.29* 0.94 0.96    Estimated Creatinine Clearance: 112.4 mL/min (by C-G formula based on SCr of 0.96 mg/dL).   Medications:  Scheduled:  . albuterol  2 puff Inhalation Q6H  . aspirin EC  81 mg Oral Daily  . atorvastatin  40 mg Oral QPM  . dexamethasone (DECADRON) injection  6 mg Intravenous Q12H  . fluticasone furoate-vilanterol  1 puff Inhalation Daily   And  . umeclidinium bromide  1 puff Inhalation Daily  . insulin aspart  0-15 Units Subcutaneous TID WC  . insulin aspart  0-5 Units Subcutaneous QHS  . insulin aspart  4 Units Subcutaneous TID WC  . metoprolol succinate  50 mg Oral Daily  . Warfarin - Pharmacist Dosing Inpatient   Does not apply q1800   Infusions:  . remdesivir 100 mg in NS 250 mL 100 mg (07/10/19 2140)    Assessment: 72 yoM admitted on 8/12 for COVID-19 pneumonia with PMH of Afib, mitral valve replacement with metallic valve, tricuspid valve repair, hypertension, COPD, CAD, hyperlipidemia, obesity, chronic diastolic CHF.  Pharmacy is consulted to continue warfarin dosing. PTA warfarin: 4mg  daily except 6 mg on Mon/Thursday.  Unknown last dose.  Admission INR 1.8  Today, 07/11/2019: INR significantly increased to 4.4,  supratherapeutic after only 2 warfarin doses. CBC:  Hgb low/stable at 11.4, Plt 391 No bleeding or complications reported AST/ALT elevated at 102/87, Tbili decreased to 0.6 D-dimer: 3.5, 2.9, 2.12, 1.75  Goal of Therapy:  INR 2.5-3.5 Monitor platelets by anticoagulation protocol: Yes   Plan:  Hold warfarin today. Daily PT/INR. Monitor for signs and symptoms of bleeding.   Gretta Arab PharmD, BCPS Clinical pharmacist phone 7am- 5pm: [phone] 07/11/2019 7:15 AM

## 2019-07-11 NOTE — Progress Notes (Addendum)
[facility] PROGRESS NOTE    Progress Note  [facility] Larson  OEV:[id number] DOB: 11-Oct-1956 DOA: 07/08/2019 PCP: [name], DO     Brief Narrative:   Jeffrey Larson is an 63 y.o. male past medical history significant for atrial fibrillation status post Maze procedure, metallic mitral valve replacement, tricuspid repair, essential hypertension obesity chronic diastolic heart failure presents to the hospital with shortness of breath.  The patient was tested positive for COVID-19 was admitted to [facility] now recovering at home, he has been feeling sick for 5 days prior to admission with fever chills fatigue over the last 3 days and getting progressive shortness of breath. Symptoms started 8.8.2020 Medications started:  07/08/2019 IV Remdesivir 07/08/2019 oral Decadron  Assessment/Plan:   Acute respiratory failure with hypoxia ([place]) due to COVID-19 virus infection: Patient has multiple risk factors for acute organ decompensation and poor prognosis including age. Continue IV Remdesivir and oral Decadron daily, he was given IV Actemra on a 11/15/2019 Has a good baseline functional status. His inflammatory markers have drastically improved. He is satting greater than 90% on 50 feet liters high flow nasal cannula. Continue to prone patient for at least 16 hours a day.  Has remained afebrile for over 48 hours. Breathing is much better compared to yesterday.  Acute kidney injury: Baseline creatinine of less than 1, etiology is likely prerenal azotemia in the setting of torsemide, Aldactone and Entresto. Creatinine improved, with holding the previously mentioned medications and IV fluid. KVO IV fluids.  New transaminitis: Likely due to COVID-19 infection, continue conservative management.  Hypovolemic hyponatremia: Resolved with IV fluid hydration.  Essential hypertension, benign Pressures well controlled continue metoprolol.  Continue to hold Entresto.  COPD with chronic  bronchitis ([place]) Asymptomatic currently on steroids no wheezing on physical exam.  History of atrial fibrillation status post Maze procedure valve repair/mechanical mitral valve/  S/P mitral valve replacement with metallic valve + tricuspid valve repair + maze procedure: Currently rate controlled continue, hold Coumadin and DC enoxaparin INR his therapeutic. Goal INR 2.5-3.5.  History of combined systolic and diastolic heart failure: Continue metoprolol, resume torsemide continue to hold Entresto.  Controlled type 2 diabetes mellitus without complication, without long-term current use of insulin ([place]): A1 66.5, blood glucose is fairly controlled continue sliding scale insulin.  DVT prophylaxis: lovenox and coumadin Family Communication:daughter Disposition Plan/Barrier to D/C: once off oxygen Code Status:     Code Status Orders  (From admission, onward)         Start     Ordered   07/08/19 2226  Full code  Continuous     07/08/19 2225        Code Status History    Date Active Date Inactive Code Status Order ID Comments User Context   07/18/2018 1524 07/31/2018 1626 Full Code [id number]  Rexene Alberts, MD Inpatient   06/10/2018 1352 06/17/2018 1649 Full Code [id number]  Dixie Dials, MD Inpatient   11/12/2016 1713 11/15/2016 1509 Full Code [id number]  Thurnell Lose, MD Inpatient   Advance Care Planning Activity        IV Access:    Peripheral IV   Procedures and diagnostic studies:   No results found.   Medical Consultants:    None.  Anti-Infectives:   None  Subjective:    [facility] Larson relates that his breathing is much better compared to yesterday.  Objective:    Vitals:   07/10/19 0929 07/10/19 1700 07/10/19 1956 07/11/19 0500  BP: (!) 92/40 Marland Kitchen)  99/57 (!) 113/53 (!) 110/58  Pulse: 72 99 74 (!) 50  Resp: (!) 32 20 (!) 25 17  Temp: 98.1 F (36.7 C) 98.1 F (36.7 C) 99 F (37.2 C) 98.7 F (37.1 C)  TempSrc:   Oral Oral  SpO2: 92% 100%  96% 98%  Weight:      Height:       SpO2: 98 % O2 Flow Rate (L/min): 15 L/min   Intake/Output Summary (Last 24 hours) at 07/11/2019 0852 Last data filed at 07/11/2019 0500 Gross per 24 hour  Intake 480 ml  Output 300 ml  Net 180 ml   Filed Weights   07/08/19 1443  Weight: 136.1 kg    Exam: General exam: In no acute distress. Respiratory system: Good air movement and diffuse crackles. Cardiovascular system: S1 & S2 heard, RRR. No JVD. Gastrointestinal system: Abdomen is nondistended, soft and nontender.  Central nervous system: Alert and oriented. No focal neurological deficits. Extremities: No pedal edema. Skin: No rashes, lesions or ulcers Psychiatry: Judgement and insight appear normal. Mood & affect appropriate.    Data Reviewed:    Labs: Basic Metabolic Panel: Recent Labs  Lab 07/08/19 1456 07/09/19 0155 07/10/19 0243 07/11/19 0030  NA 129* 133* 133* 135  K 3.8 3.8 4.3 4.6  CL 91* 94* 97* 98  CO2 25 26 27 26   GLUCOSE 95 138* 126* 150*  BUN 37* 36* 36* 31*  CREATININE 1.65* 1.29* 0.94 0.96  CALCIUM 8.0* 7.6* 8.2* 8.4*  MG  --  2.5* 2.6* 2.3  PHOS  --  3.0 2.5 3.1   GFR Estimated Creatinine Clearance: 112.4 mL/min (by C-G formula based on SCr of 0.96 mg/dL). Liver Function Tests: Recent Labs  Lab 07/08/19 1456 07/09/19 0155 07/10/19 0243 07/11/19 0030  AST 116* 102* 105* 102*  ALT 78* 71* 79* 87*  ALKPHOS 62 61 64 68  BILITOT 1.5* 1.3* 1.1 0.6  PROT 6.7 6.2* 6.2* 6.3*  ALBUMIN 3.3* 2.9* 2.8* 2.8*   No results for input(s): LIPASE, AMYLASE in the last 168 hours. No results for input(s): AMMONIA in the last 168 hours. Coagulation profile Recent Labs  Lab 07/08/19 1456 07/09/19 0155 07/10/19 0243 07/11/19 0030  INR 1.8* 1.9* 3.6* 4.4*   COVID-19 Labs  Recent Labs    07/08/19 1456  07/09/19 0155 07/10/19 0243 07/10/19 0850 07/11/19 0030  DDIMER 3.50*  --  2.92* 2.12*  --  1.75*  FERRITIN  --    < > 6,943* >7,500*  --  4,902*  LDH  906*  --   --   --  949* 763*  CRP  --    < > 21.7* 14.2*  --  8.7*   < > = values in this interval not displayed.    Lab Results  Component Value Date   SARSCOV2NAA Detected (A) 07/06/2019   [place] Not Detected 06/23/2019   [place] Not Detected 06/05/2019    CBC: Recent Labs  Lab 07/08/19 1456 07/09/19 0155 07/10/19 0243 07/11/19 0030  WBC 10.1 9.8 14.3* 9.3  NEUTROABS 9.3* 9.2* 13.3* 8.4*  HGB 12.1* 10.7* 10.8* 11.4*  HCT 36.1* 32.3* 33.2* 34.6*  MCV 86.2 88.7 89.5 90.3  PLT 300 258 343 391   Cardiac Enzymes: No results for input(s): CKTOTAL, CKMB, CKMBINDEX, TROPONINI in the last 168 hours. BNP (last 3 results) No results for input(s): PROBNP in the last 8760 hours. CBG: Recent Labs  Lab 07/10/19 0815 07/10/19 1139 07/10/19 1713 07/10/19 2109 07/11/19 0839  GLUCAP 139*  141* 119* 134* 132*   D-Dimer: Recent Labs    07/10/19 0243 07/11/19 0030  DDIMER 2.12* 1.75*   Hgb A1c: Recent Labs    07/09/19 0155  HGBA1C 6.1*   Lipid Profile: Recent Labs    07/08/19 1529  TRIG 180*   Thyroid function studies: No results for input(s): TSH, T4TOTAL, T3FREE, THYROIDAB in the last 72 hours.  Invalid input(s): FREET3 Anemia work up: Recent Labs    07/10/19 0243 07/11/19 0030  FERRITIN >7,500* 4,902*   Sepsis Labs: Recent Labs  Lab 07/08/19 1456 07/09/19 0155 07/10/19 0243 07/11/19 0030  PROCALCITON 0.14  --   --   --   WBC 10.1 9.8 14.3* 9.3  LATICACIDVEN 1.5  --   --   --    Microbiology Recent Results (from the past 240 hour(s))  Novel Coronavirus, NAA ([facility])     Status: Abnormal   Collection Time: 07/06/19 12:45 PM   Specimen: Oropharyngeal(OP) collection in vial transport medium  Result Value Ref Range Status   SARS-CoV-2, NAA Detected (A) Not Detected Final    Comment: This test was developed and its performance characteristics determined by [employer]. This test has not been FDA cleared or approved. This test has  been authorized by FDA under an Emergency Use Authorization (EUA). This test is only authorized for the duration of time the declaration that circumstances exist justifying the authorization of the emergency use of in vitro diagnostic tests for detection of SARS-CoV-2 virus and/or diagnosis of COVID-19 infection under section 564(b)(1) of the Act, 21 U.S.C. 269SWN-4(O)(2), unless the authorization is terminated or revoked sooner. When diagnostic testing is negative, the possibility of a false negative result should be considered in the context of a patient's recent exposures and the presence of clinical signs and symptoms consistent with COVID-19. An individual without symptoms of COVID-19 and who is not shedding SARS-CoV-2 virus would expect to have a negative (not detected) result in this assay.   Blood Culture (routine x 2)     Status: None (Preliminary result)   Collection Time: 07/08/19  2:56 PM   Specimen: Right Antecubital; Blood  Result Value Ref Range Status   Specimen Description RIGHT ANTECUBITAL  Final   Special Requests   Final    BOTTLES DRAWN AEROBIC AND ANAEROBIC Blood Culture adequate volume   Culture   Final    NO GROWTH 2 DAYS Performed at [facility], [address], [place], [place] [address]    Report Status PENDING  Incomplete  Blood Culture (routine x 2)     Status: None (Preliminary result)   Collection Time: 07/08/19  3:29 PM   Specimen: BLOOD RIGHT HAND  Result Value Ref Range Status   Specimen Description BLOOD RIGHT HAND  Final   Special Requests   Final    BOTTLES DRAWN AEROBIC AND ANAEROBIC Blood Culture adequate volume   Culture   Final    NO GROWTH 2 DAYS Performed at [facility], [address], [place], [place] [address]    Report Status PENDING  Incomplete     Medications:   . albuterol  2 puff Inhalation Q6H  . aspirin EC  81 mg Oral Daily  . atorvastatin  40 mg Oral QPM  . dexamethasone (DECADRON) injection  6 mg Intravenous Q12H  .  fluticasone furoate-vilanterol  1 puff Inhalation Daily   And  . umeclidinium bromide  1 puff Inhalation Daily  . insulin aspart  0-15 Units Subcutaneous TID WC  . insulin  aspart  0-5 Units Subcutaneous QHS  . insulin aspart  4 Units Subcutaneous TID WC  . metoprolol succinate  50 mg Oral Daily  . Warfarin - Pharmacist Dosing Inpatient   Does not apply q1800   Continuous Infusions: . remdesivir 100 mg in NS 250 mL Stopped (07/10/19 2240)      LOS: 3 days   Charlynne Cousins  [facility]  07/11/2019, 8:52 AM

## 2019-07-11 NOTE — Progress Notes (Signed)
CRITICAL VALUE STICKER  CRITICAL VALUE: INR of 4.4  RECEIVER (on-site recipient of call): Lyndee Leo, RN  MD NOTIFIED: Dr. Shanon Brow  TIME OF NOTIFICATION: [phone] (2nd notification).  RESPONSE: MD states no medication changes.

## 2019-07-11 NOTE — Plan of Care (Signed)
Will continue with plan of care. 

## 2019-07-11 NOTE — Progress Notes (Signed)
Physical Therapy Treatment Patient Details Name: Jeffrey Larson MRN: [id number] DOB: 1956/11/12 Today's Date: 07/11/2019    History of Present Illness Jeffrey Larson is an 63 y.o. male past medical history : atrial fibrillation , metallic mitral valve replacement, tricuspid repair, essential hypertension ,obesity, chronic diastolic heart failure presents to the hospital 07/08/19 with shortness of breath, hypoxia,fever chills,Positive for covid-19 07/06/19,recovering at home,    PT Comments    Pt continues to demonstrate decr SpO2 with mobility. Will continue to follow to assist with incr activity tolerance while maintaining adequate oxygenation.    Follow Up Recommendations  No PT follow up     Equipment Recommendations  None recommended by PT    Recommendations for Other Services       Precautions / Restrictions Precautions Precaution Comments: on 15 L HFNC    Mobility  Bed Mobility Overal bed mobility: Independent                Transfers Overall transfer level: Independent                  Ambulation/Gait Ambulation/Gait assistance: Supervision Gait Distance (Feet): 240 Feet Assistive device: None Gait Pattern/deviations: WFL(Within Functional Limits) Gait velocity: decr Gait velocity interpretation: >2.62 ft/sec, indicative of community ambulatory General Gait Details: Assist to manage lines and monitor O2. Pt amb in room on 15L with SpO2 to 77%. Recovered to 90% after sitting rest of ~3 minutes   Stairs             Wheelchair Mobility    Modified Rankin (Stroke Patients Only)       Balance Overall balance assessment: No apparent balance deficits (not formally assessed)                                          Cognition Arousal/Alertness: Awake/alert Behavior During Therapy: WFL for tasks assessed/performed Overall Cognitive Status: Within Functional Limits for tasks assessed                                        Exercises      General Comments        Pertinent Vitals/Pain      Home Living                      Prior Function            PT Goals (current goals can now be found in the care plan section) Acute Rehab PT Goals Patient Stated Goal: to breathe better, go home PT Goal Formulation: With patient Time For Goal Achievement: 07/24/19 Potential to Achieve Goals: Good Progress towards PT goals: Progressing toward goals    Frequency    Min 3X/week      PT Plan Current plan remains appropriate    Co-evaluation              AM-PAC PT "6 Clicks" Mobility   Outcome Measure  Help needed turning from your back to your side while in a flat bed without using bedrails?: None Help needed moving from lying on your back to sitting on the side of a flat bed without using bedrails?: None Help needed moving to and from a bed to a chair (including a wheelchair)?: None Help needed standing up from  a chair using your arms (e.g., wheelchair or bedside chair)?: None Help needed to walk in hospital room?: A Little Help needed climbing 3-5 steps with a railing? : A Little 6 Click Score: 22    End of Session Equipment Utilized During Treatment: Oxygen Activity Tolerance: Patient tolerated treatment well Patient left: in chair;with call bell/phone within reach(going back to prone) Nurse Communication: Mobility status PT Visit Diagnosis: Difficulty in walking, not elsewhere classified (R26.2)     Time: 1211-1225 PT Time Calculation (min) (ACUTE ONLY): 14 min  Charges:  $Gait Training: 8-22 mins                     [place] Pager [phone] Office [place] 07/11/2019, 12:50 PM

## 2019-07-11 NOTE — Progress Notes (Signed)
CRITICAL VALUE STICKER  CRITICAL VALUE: INR 4.4  RECEIVER (on-site recipient of call): C. Tory Emerald, RN  DATE & TIME NOTIFIED:  07/11/2019 0430  MD NOTIFIED: Dr. Shanon Brow   TIME OF NOTIFICATION: [phone]

## 2019-07-12 LAB — CBC WITH DIFFERENTIAL/PLATELET
Abs Immature Granulocytes: 0.96 10*3/uL — ABNORMAL HIGH (ref 0.00–0.07)
Basophils Absolute: 0.1 10*3/uL (ref 0.0–0.1)
Basophils Relative: 1 %
Eosinophils Absolute: 0 10*3/uL (ref 0.0–0.5)
Eosinophils Relative: 0 %
HCT: 36.2 % — ABNORMAL LOW (ref 39.0–52.0)
Hemoglobin: 11.6 g/dL — ABNORMAL LOW (ref 13.0–17.0)
Immature Granulocytes: 7 %
Lymphocytes Relative: 4 %
Lymphs Abs: 0.6 10*3/uL — ABNORMAL LOW (ref 0.7–4.0)
MCH: 29.1 pg (ref 26.0–34.0)
MCHC: 32 g/dL (ref 30.0–36.0)
MCV: 91 fL (ref 80.0–100.0)
Monocytes Absolute: 0.3 10*3/uL (ref 0.1–1.0)
Monocytes Relative: 2 %
Neutro Abs: 11.2 10*3/uL — ABNORMAL HIGH (ref 1.7–7.7)
Neutrophils Relative %: 86 %
Platelets: 372 10*3/uL (ref 150–400)
RBC: 3.98 MIL/uL — ABNORMAL LOW (ref 4.22–5.81)
RDW: 14.7 % (ref 11.5–15.5)
WBC: 13.1 10*3/uL — ABNORMAL HIGH (ref 4.0–10.5)
nRBC: 0 % (ref 0.0–0.2)

## 2019-07-12 LAB — D-DIMER, QUANTITATIVE: D-Dimer, Quant: 3.15 ug/mL-FEU — ABNORMAL HIGH (ref 0.00–0.50)

## 2019-07-12 LAB — COMPREHENSIVE METABOLIC PANEL
ALT: 86 U/L — ABNORMAL HIGH (ref 0–44)
AST: 86 U/L — ABNORMAL HIGH (ref 15–41)
Albumin: 2.9 g/dL — ABNORMAL LOW (ref 3.5–5.0)
Alkaline Phosphatase: 66 U/L (ref 38–126)
Anion gap: 11 (ref 5–15)
BUN: 44 mg/dL — ABNORMAL HIGH (ref 8–23)
CO2: 30 mmol/L (ref 22–32)
Calcium: 8.6 mg/dL — ABNORMAL LOW (ref 8.9–10.3)
Chloride: 95 mmol/L — ABNORMAL LOW (ref 98–111)
Creatinine, Ser: 1.17 mg/dL (ref 0.61–1.24)
GFR calc Af Amer: >60 mL/min (ref >60)
GFR calc non Af Amer: >60 mL/min (ref >60)
Glucose, Bld: 159 mg/dL — ABNORMAL HIGH (ref 70–99)
Potassium: 5.1 mmol/L (ref 3.5–5.1)
Sodium: 136 mmol/L (ref 135–145)
Total Bilirubin: 0.6 mg/dL (ref 0.3–1.2)
Total Protein: 6.2 g/dL — ABNORMAL LOW (ref 6.5–8.1)

## 2019-07-12 LAB — LACTATE DEHYDROGENASE: LDH: 756 U/L — ABNORMAL HIGH (ref 98–192)

## 2019-07-12 LAB — PROTIME-INR
INR: 4.1 (ref 0.8–1.2)
Prothrombin Time: 38.9 seconds — ABNORMAL HIGH (ref 11.4–15.2)

## 2019-07-12 LAB — GLUCOSE, CAPILLARY
Glucose-Capillary: 145 mg/dL — ABNORMAL HIGH (ref 70–99)
Glucose-Capillary: 127 mg/dL — ABNORMAL HIGH (ref 70–99)
Glucose-Capillary: 138 mg/dL — ABNORMAL HIGH (ref 70–99)
Glucose-Capillary: 196 mg/dL — ABNORMAL HIGH (ref 70–99)

## 2019-07-12 LAB — MAGNESIUM: Magnesium: 2 mg/dL (ref 1.7–2.4)

## 2019-07-12 LAB — PHOSPHORUS: Phosphorus: 4.3 mg/dL (ref 2.5–4.6)

## 2019-07-12 LAB — C-REACTIVE PROTEIN: CRP: 4.5 mg/dL — ABNORMAL HIGH (ref <1.0)

## 2019-07-12 LAB — FERRITIN: Ferritin: 3290 ng/mL — ABNORMAL HIGH (ref 24–336)

## 2019-07-12 MED ORDER — WARFARIN SODIUM 1 MG PO TABS
1.0000 mg | ORAL_TABLET | Freq: Once | ORAL | Status: AC
  Administered 2019-07-12: 1 mg via ORAL
  Filled 2019-07-12: qty 1

## 2019-07-12 NOTE — Progress Notes (Signed)
CRITICAL VALUE ALERT  Critical Value:  INR 4.1  Date & Time Notied:  07/12/2019 at 0547  Provider Notified: Primary nurse notified- MD aware already as this is lower than last result  Orders Received/Actions taken: no orders at this time

## 2019-07-12 NOTE — Progress Notes (Signed)
CRITICAL VALUE STICKER  CRITICAL VALUE: INR of 4.1  DATE & TIME NOTIFIED: 07/12/19 @ 0551  MD NOTIFIED: Dr. Shanon Brow  TIME OF NOTIFICATION: 3903  RESPONSE: No new orders received at this time

## 2019-07-12 NOTE — Progress Notes (Signed)
ANTICOAGULATION CONSULT NOTE - Follow Up Consult  Pharmacy Consult for warfarin Indication: Afib, mechanical mitral valve replacement  Allergies  Allergen Reactions  . Ramipril Cough    cough  . Percocet [Oxycodone-Acetaminophen]     In ICU it contributed to hallucinations. Can take by itself    Patient Measurements: Height: 5\' 11"  (180.3 cm) Weight: 300 lb (136.1 kg) IBW/kg (Calculated) : 75.3   Vital Signs: Temp: 97.7 F (36.5 C) (08/16 0754) Temp Source: Oral (08/16 0754) BP: 85/68 (08/16 0754) Pulse Rate: 54 (08/16 0754)  Labs: Recent Labs    07/10/19 0243 07/11/19 0030 07/12/19 0236  HGB 10.8* 11.4* 11.6*  HCT 33.2* 34.6* 36.2*  PLT 343 391 372  LABPROT 35.4* 41.2* 38.9*  INR 3.6* 4.4* 4.1*  CREATININE 0.94 0.96 1.17    Estimated Creatinine Clearance: 92.2 mL/min (by C-G formula based on SCr of 1.17 mg/dL).   Medications:  Scheduled:  . albuterol  2 puff Inhalation Q6H  . aspirin EC  81 mg Oral Daily  . atorvastatin  40 mg Oral QPM  . dexamethasone (DECADRON) injection  6 mg Intravenous Q12H  . fluticasone furoate-vilanterol  1 puff Inhalation Daily   And  . umeclidinium bromide  1 puff Inhalation Daily  . insulin aspart  0-15 Units Subcutaneous TID WC  . insulin aspart  0-5 Units Subcutaneous QHS  . insulin aspart  4 Units Subcutaneous TID WC  . metoprolol succinate  50 mg Oral Daily  . spironolactone  25 mg Oral Daily  . torsemide  40 mg Oral Daily  . Warfarin - Pharmacist Dosing Inpatient   Does not apply q1800   Infusions:  . remdesivir 100 mg in NS 250 mL Stopped (07/11/19 2226)    Assessment: 46 yoM admitted on 8/12 for COVID-19 pneumonia with PMH of Afib, mitral valve replacement with metallic valve, tricuspid valve repair, hypertension, COPD, CAD, hyperlipidemia, obesity, chronic diastolic CHF.  Pharmacy is consulted to continue warfarin dosing. PTA warfarin: 4mg  daily except 6 mg on Mon/Thursday.  Unknown last dose.  Admission INR  1.8  Today, 07/12/2019: INR 4.1 remains supratherapeutic but decreasing - He was given 2 boosted warfarin doses, then held for 2 days. CBC:  Hgb low/stable at 11.6, Plt remains WNL No bleeding or complications reported AST/ALT elevated but decreasing 86/86. D-dimer: 3.5, 2.9, 2.12, 1.75  Goal of Therapy:  INR 2.5-3.5 Monitor platelets by anticoagulation protocol: Yes   Plan:  Low dose warfarin 1mg  PO today at 1800 Daily PT/INR. Monitor for signs and symptoms of bleeding.   Gretta Arab PharmD, BCPS Clinical pharmacist phone 7am- 5pm: [phone] 07/12/2019 8:29 AM

## 2019-07-12 NOTE — Progress Notes (Signed)
[facility] PROGRESS NOTE    Progress Note  Jeffrey Larson  HAL:[id number] DOB: Nov 06, 1956 DOA: 07/08/2019 PCP: [name], DO     Brief Narrative:   Jeffrey Larson is an 63 y.o. male past medical history significant for atrial fibrillation status post Maze procedure, metallic mitral valve replacement, tricuspid repair, essential hypertension obesity chronic diastolic heart failure presents to the hospital with shortness of breath.  The patient was tested positive for COVID-19 was admitted to [facility] now recovering at home, he has been feeling sick for 5 days prior to admission with fever chills fatigue over the last 3 days and getting progressive shortness of breath. Symptoms started 8.8.2020 Medications started:  07/08/2019 IV Remdesivir 07/08/2019 oral Decadron  Assessment/Plan:   Acute respiratory failure with hypoxia ([place]) due to COVID-19 virus infection: Patient has multiple risk factors for acute organ decompensation and poor prognosis including age. Continue IV Remdesivir and oral Decadron twice daily, he was given IV Actemra on 07/08/19/2020. His inflammatory markers are significantly improved, he is still requiring 15 L of high flow nasal cannula to keep saturation above 90%. Try to keep the patient prone for at least 16 hours a day.  Acute kidney injury: Baseline creatinine of less than 1 likely prerenal azotemia in the setting of torsemide Aldactone and Entresto. Creatinine has returned to baseline with IV hydration. We will hold torsemide and Aldactone.  New transaminitis: He is currently asymptomatic likely due to COVID-19 infection. Continue conservative management.  Hypovolemic hyponatremia: Resolved with IV fluids.  Essential hypertension, benign Blood pressure is borderline, continue metoprolol hold diuretic therapy, continue to hold Entresto.  COPD with chronic bronchitis ([place]) Asymptomatic currently on steroids no wheezing on physical exam.   History of atrial fibrillation status post Maze procedure valve repair/mechanical mitral valve/  S/P mitral valve replacement with metallic valve + tricuspid valve repair + maze procedure: Currently rate controlled continue, currently off Lovenox his goal INR is 2.5-3.5. Today's INR was 4.1 we are holding Coumadin is trending down very nicely.  History of combined systolic and diastolic heart failure: Continue metoprolol, continue to hold torsemide and Aldactone and Entresto.  Controlled type 2 diabetes mellitus without complication, without long-term current use of insulin ([place]): A1 6.5, blood glucose is fairly controlled continue sliding scale insulin.  DVT prophylaxis: coumadin goal INR 2.5-3.5 Family Communication:daughter Disposition Plan/Barrier to D/C: once off oxygen Code Status:     Code Status Orders  (From admission, onward)         Start     Ordered   07/08/19 2226  Full code  Continuous     07/08/19 2225        Code Status History    Date Active Date Inactive Code Status Order ID Comments User Context   07/18/2018 1524 07/31/2018 1626 Full Code [id number]  Rexene Alberts, MD Inpatient   06/10/2018 1352 06/17/2018 1649 Full Code [id number]  Dixie Dials, MD Inpatient   11/12/2016 1713 11/15/2016 1509 Full Code [id number]  Thurnell Lose, MD Inpatient   Advance Care Planning Activity        IV Access:    Peripheral IV   Procedures and diagnostic studies:   No results found.   Medical Consultants:    None.  Anti-Infectives:   None  Subjective:    Jeffrey Larson relates his appetite has returned he feels his breathing has improved.  Objective:    Vitals:   07/11/19 1705 07/11/19 2120 07/12/19 0356 07/12/19 0754  BP: 114/67  95/65 105/66 (!) 85/68  Pulse: 79 72 (!) 58 (!) 54  Resp: 19 (!) 27 (!) 26 16  Temp: 98.5 F (36.9 C) (!) 97.5 F (36.4 C) 98.4 F (36.9 C) 97.7 F (36.5 C)  TempSrc: Oral Oral Oral Oral  SpO2: 90% 91% 92% 92%   Weight:      Height:       SpO2: 92 % O2 Flow Rate (L/min): 15 L/min   Intake/Output Summary (Last 24 hours) at 07/12/2019 0839 Last data filed at 07/11/2019 2100 Gross per 24 hour  Intake 1090 ml  Output 3275 ml  Net -2185 ml   Filed Weights   07/08/19 1443  Weight: 136.1 kg    Exam: General exam: In no acute distress. Respiratory system: Good air movement and diffuse crackles bilaterally Cardiovascular system: S1 & S2 heard, RRR. No JVD, murmurs. Gastrointestinal system: Abdomen is nondistended, soft and nontender.  Central nervous system: Alert and oriented. No focal neurological deficits. Extremities: No pedal edema. Skin: No rashes, lesions or ulcers Psychiatry: Judgement and insight appear normal. Mood & affect appropriate.    Data Reviewed:    Labs: Basic Metabolic Panel: Recent Labs  Lab 07/08/19 1456 07/09/19 0155 07/10/19 0243 07/11/19 0030 07/12/19 0236  NA 129* 133* 133* 135 136  K 3.8 3.8 4.3 4.6 5.1  CL 91* 94* 97* 98 95*  CO2 25 26 27 26 30   GLUCOSE 95 138* 126* 150* 159*  BUN 37* 36* 36* 31* 44*  CREATININE 1.65* 1.29* 0.94 0.96 1.17  CALCIUM 8.0* 7.6* 8.2* 8.4* 8.6*  MG  --  2.5* 2.6* 2.3 2.0  PHOS  --  3.0 2.5 3.1 4.3   GFR Estimated Creatinine Clearance: 92.2 mL/min (by C-G formula based on SCr of 1.17 mg/dL). Liver Function Tests: Recent Labs  Lab 07/08/19 1456 07/09/19 0155 07/10/19 0243 07/11/19 0030 07/12/19 0236  AST 116* 102* 105* 102* 86*  ALT 78* 71* 79* 87* 86*  ALKPHOS 62 61 64 68 66  BILITOT 1.5* 1.3* 1.1 0.6 0.6  PROT 6.7 6.2* 6.2* 6.3* 6.2*  ALBUMIN 3.3* 2.9* 2.8* 2.8* 2.9*   No results for input(s): LIPASE, AMYLASE in the last 168 hours. No results for input(s): AMMONIA in the last 168 hours. Coagulation profile Recent Labs  Lab 07/08/19 1456 07/09/19 0155 07/10/19 0243 07/11/19 0030 07/12/19 0236  INR 1.8* 1.9* 3.6* 4.4* 4.1*   COVID-19 Labs  Recent Labs    07/10/19 0243 07/10/19 0850 07/11/19  0030 07/12/19 0236  DDIMER 2.12*  --  1.75* 3.15*  FERRITIN >7,500*  --  4,902* 3,290*  LDH  --  949* 763* 756*  CRP 14.2*  --  8.7* 4.5*    Lab Results  Component Value Date   SARSCOV2NAA Detected (A) 07/06/2019   [place] Not Detected 06/23/2019   [place] Not Detected 06/05/2019    CBC: Recent Labs  Lab 07/08/19 1456 07/09/19 0155 07/10/19 0243 07/11/19 0030 07/12/19 0236  WBC 10.1 9.8 14.3* 9.3 13.1*  NEUTROABS 9.3* 9.2* 13.3* 8.4* 11.2*  HGB 12.1* 10.7* 10.8* 11.4* 11.6*  HCT 36.1* 32.3* 33.2* 34.6* 36.2*  MCV 86.2 88.7 89.5 90.3 91.0  PLT 300 258 343 391 372   Cardiac Enzymes: No results for input(s): CKTOTAL, CKMB, CKMBINDEX, TROPONINI in the last 168 hours. BNP (last 3 results) No results for input(s): PROBNP in the last 8760 hours. CBG: Recent Labs  Lab 07/11/19 0839 07/11/19 1215 07/11/19 1645 07/11/19 2123 07/12/19 0753  GLUCAP 132* 127* 116* 170* 145*  D-Dimer: Recent Labs    07/11/19 0030 07/12/19 0236  DDIMER 1.75* 3.15*   Hgb A1c: No results for input(s): HGBA1C in the last 72 hours. Lipid Profile: No results for input(s): CHOL, HDL, LDLCALC, TRIG, CHOLHDL, LDLDIRECT in the last 72 hours. Thyroid function studies: No results for input(s): TSH, T4TOTAL, T3FREE, THYROIDAB in the last 72 hours.  Invalid input(s): FREET3 Anemia work up: Recent Labs    07/11/19 0030 07/12/19 0236  FERRITIN 4,902* 3,290*   Sepsis Labs: Recent Labs  Lab 07/08/19 1456 07/09/19 0155 07/10/19 0243 07/11/19 0030 07/12/19 0236  PROCALCITON 0.14  --   --   --   --   WBC 10.1 9.8 14.3* 9.3 13.1*  LATICACIDVEN 1.5  --   --   --   --    Microbiology Recent Results (from the past 240 hour(s))  Novel Coronavirus, NAA ([facility])     Status: Abnormal   Collection Time: 07/06/19 12:45 PM   Specimen: Oropharyngeal(OP) collection in vial transport medium  Result Value Ref Range Status   SARS-CoV-2, NAA Detected (A) Not Detected Final    Comment: This  test was developed and its performance characteristics determined by [employer]. This test has not been FDA cleared or approved. This test has been authorized by FDA under an Emergency Use Authorization (EUA). This test is only authorized for the duration of time the declaration that circumstances exist justifying the authorization of the emergency use of in vitro diagnostic tests for detection of SARS-CoV-2 virus and/or diagnosis of COVID-19 infection under section 564(b)(1) of the Act, 21 U.S.C. 272ZDG-6(Y)(4), unless the authorization is terminated or revoked sooner. When diagnostic testing is negative, the possibility of a false negative result should be considered in the context of a patient's recent exposures and the presence of clinical signs and symptoms consistent with COVID-19. An individual without symptoms of COVID-19 and who is not shedding SARS-CoV-2 virus would expect to have a negative (not detected) result in this assay.   Blood Culture (routine x 2)     Status: None (Preliminary result)   Collection Time: 07/08/19  2:56 PM   Specimen: Right Antecubital; Blood  Result Value Ref Range Status   Specimen Description RIGHT ANTECUBITAL  Final   Special Requests   Final    BOTTLES DRAWN AEROBIC AND ANAEROBIC Blood Culture adequate volume   Culture   Final    NO GROWTH 3 DAYS Performed at [facility], [address], [place], [place] [address]    Report Status PENDING  Incomplete  Blood Culture (routine x 2)     Status: None (Preliminary result)   Collection Time: 07/08/19  3:29 PM   Specimen: BLOOD RIGHT HAND  Result Value Ref Range Status   Specimen Description BLOOD RIGHT HAND  Final   Special Requests   Final    BOTTLES DRAWN AEROBIC AND ANAEROBIC Blood Culture adequate volume   Culture   Final    NO GROWTH 3 DAYS Performed at [facility], [address] [place], [place] [address]    Report Status PENDING  Incomplete     Medications:   . albuterol   2 puff Inhalation Q6H  . aspirin EC  81 mg Oral Daily  . atorvastatin  40 mg Oral QPM  . dexamethasone (DECADRON) injection  6 mg Intravenous Q12H  . fluticasone furoate-vilanterol  1 puff Inhalation Daily   And  . umeclidinium bromide  1 puff Inhalation Daily  . insulin aspart  0-15 Units Subcutaneous TID  WC  . insulin aspart  0-5 Units Subcutaneous QHS  . insulin aspart  4 Units Subcutaneous TID WC  . metoprolol succinate  50 mg Oral Daily  . spironolactone  25 mg Oral Daily  . torsemide  40 mg Oral Daily  . warfarin  1 mg Oral ONCE-1800  . Warfarin - Pharmacist Dosing Inpatient   Does not apply q1800   Continuous Infusions: . remdesivir 100 mg in NS 250 mL Stopped (07/11/19 2226)      LOS: 4 days   Charlynne Cousins  [facility]  07/12/2019, 8:39 AM

## 2019-07-12 NOTE — Plan of Care (Signed)
Will continue with plan of care. 

## 2019-07-13 ENCOUNTER — Inpatient Hospital Stay (HOSPITAL_COMMUNITY): Payer: BC Managed Care – PPO

## 2019-07-13 ENCOUNTER — Encounter (HOSPITAL_COMMUNITY): Payer: BC Managed Care – PPO

## 2019-07-13 ENCOUNTER — Inpatient Hospital Stay: Payer: Self-pay

## 2019-07-13 DIAGNOSIS — M7989 Other specified soft tissue disorders: Secondary | ICD-10-CM

## 2019-07-13 LAB — COMPREHENSIVE METABOLIC PANEL
ALT: 81 U/L — ABNORMAL HIGH (ref 0–44)
AST: 75 U/L — ABNORMAL HIGH (ref 15–41)
Albumin: 3 g/dL — ABNORMAL LOW (ref 3.5–5.0)
Alkaline Phosphatase: 80 U/L (ref 38–126)
Anion gap: 12 (ref 5–15)
BUN: 38 mg/dL — ABNORMAL HIGH (ref 8–23)
CO2: 27 mmol/L (ref 22–32)
Calcium: 8.7 mg/dL — ABNORMAL LOW (ref 8.9–10.3)
Chloride: 98 mmol/L (ref 98–111)
Creatinine, Ser: 1 mg/dL (ref 0.61–1.24)
GFR calc Af Amer: >60 mL/min (ref >60)
GFR calc non Af Amer: >60 mL/min (ref >60)
Glucose, Bld: 140 mg/dL — ABNORMAL HIGH (ref 70–99)
Potassium: 5.2 mmol/L — ABNORMAL HIGH (ref 3.5–5.1)
Sodium: 137 mmol/L (ref 135–145)
Total Bilirubin: 1 mg/dL (ref 0.3–1.2)
Total Protein: 6.5 g/dL (ref 6.5–8.1)

## 2019-07-13 LAB — CULTURE, BLOOD (ROUTINE X 2)
Culture: NO GROWTH
Culture: NO GROWTH
Special Requests: ADEQUATE
Special Requests: ADEQUATE

## 2019-07-13 LAB — CBC WITH DIFFERENTIAL/PLATELET
Abs Immature Granulocytes: 1.32 10*3/uL — ABNORMAL HIGH (ref 0.00–0.07)
Basophils Absolute: 0.2 10*3/uL — ABNORMAL HIGH (ref 0.0–0.1)
Basophils Relative: 1 %
Eosinophils Absolute: 0 10*3/uL (ref 0.0–0.5)
Eosinophils Relative: 0 %
HCT: 37.5 % — ABNORMAL LOW (ref 39.0–52.0)
Hemoglobin: 12.1 g/dL — ABNORMAL LOW (ref 13.0–17.0)
Immature Granulocytes: 7 %
Lymphocytes Relative: 3 %
Lymphs Abs: 0.5 10*3/uL — ABNORMAL LOW (ref 0.7–4.0)
MCH: 29.4 pg (ref 26.0–34.0)
MCHC: 32.3 g/dL (ref 30.0–36.0)
MCV: 91 fL (ref 80.0–100.0)
Monocytes Absolute: 0.4 10*3/uL (ref 0.1–1.0)
Monocytes Relative: 2 %
Neutro Abs: 16 10*3/uL — ABNORMAL HIGH (ref 1.7–7.7)
Neutrophils Relative %: 87 %
Platelets: 373 10*3/uL (ref 150–400)
RBC: 4.12 MIL/uL — ABNORMAL LOW (ref 4.22–5.81)
RDW: 14.6 % (ref 11.5–15.5)
WBC: 18.4 10*3/uL — ABNORMAL HIGH (ref 4.0–10.5)
nRBC: 0 % (ref 0.0–0.2)

## 2019-07-13 LAB — FERRITIN: Ferritin: 3332 ng/mL — ABNORMAL HIGH (ref 24–336)

## 2019-07-13 LAB — MAGNESIUM: Magnesium: 2.2 mg/dL (ref 1.7–2.4)

## 2019-07-13 LAB — GLUCOSE, CAPILLARY
Glucose-Capillary: 130 mg/dL — ABNORMAL HIGH (ref 70–99)
Glucose-Capillary: 121 mg/dL — ABNORMAL HIGH (ref 70–99)
Glucose-Capillary: 110 mg/dL — ABNORMAL HIGH (ref 70–99)
Glucose-Capillary: 185 mg/dL — ABNORMAL HIGH (ref 70–99)

## 2019-07-13 LAB — D-DIMER, QUANTITATIVE: D-Dimer, Quant: >20 ug/mL-FEU — ABNORMAL HIGH (ref 0.00–0.50)

## 2019-07-13 LAB — LACTATE DEHYDROGENASE: LDH: 915 U/L — ABNORMAL HIGH (ref 98–192)

## 2019-07-13 LAB — C-REACTIVE PROTEIN: CRP: 2.3 mg/dL — ABNORMAL HIGH (ref <1.0)

## 2019-07-13 LAB — PHOSPHORUS: Phosphorus: 4.7 mg/dL — ABNORMAL HIGH (ref 2.5–4.6)

## 2019-07-13 LAB — PROTIME-INR
INR: 4.6 (ref 0.8–1.2)
Prothrombin Time: 42.6 seconds — ABNORMAL HIGH (ref 11.4–15.2)

## 2019-07-13 MED ORDER — SODIUM CHLORIDE 0.9% FLUSH: 10.0000 mL | INTRAVENOUS | Status: DC | PRN

## 2019-07-13 MED ORDER — SODIUM POLYSTYRENE SULFONATE 15 GM/60ML PO SUSP
30.0000 g | Freq: Once | ORAL | Status: AC
  Administered 2019-07-13: 30 g via ORAL
  Filled 2019-07-13: qty 120

## 2019-07-13 MED ORDER — SODIUM CHLORIDE 0.9% FLUSH
10.0000 mL | Freq: Two times a day (BID) | INTRAVENOUS | Status: DC
  Administered 2019-07-13 – 2019-07-18 (×10): 10 mL

## 2019-07-13 MED ORDER — TORSEMIDE 20 MG PO TABS
40.0000 mg | ORAL_TABLET | Freq: Two times a day (BID) | ORAL | Status: DC
  Administered 2019-07-13 (×2): 40 mg via ORAL
  Filled 2019-07-13 (×3): qty 2

## 2019-07-13 NOTE — Progress Notes (Signed)
Peripherally Inserted Central Catheter/Midline Placement  The IV Nurse has discussed with the patient and/or persons authorized to consent for the patient, the purpose of this procedure and the potential benefits and risks involved with this procedure.  The benefits include less needle sticks, lab draws from the catheter, and the patient may be discharged home with the catheter. Risks include, but not limited to, infection, bleeding, blood clot (thrombus formation), and puncture of an artery; nerve damage and irregular heartbeat and possibility to perform a PICC exchange if needed/ordered by physician.  Alternatives to this procedure were also discussed.  Bard Power PICC patient education guide, fact sheet on infection prevention and patient information card has been provided to patient /or left at bedside.    PICC/Midline Placement Documentation  PICC Single Lumen 31/59/45 PICC Right Basilic 45 cm 32 cm (Active)  Dressing Change Due 07/20/19 07/13/19 1606    Consent obtained by Christella Noa RN   Marianna Payment M 07/13/2019, 4:07 PM

## 2019-07-13 NOTE — Progress Notes (Signed)
CRITICAL VALUE ALERT  Critical Value:  INR 4.6  Date & Time Notied:  07/13/19 0730  Provider Notified: Yes  Orders Received/Actions taken: Hold Coumadin

## 2019-07-13 NOTE — Progress Notes (Signed)
Bilateral lower extremity duplex complete. Results are located under CV Proc.   Darlina Sicilian M 07/13/2019, 11:43 AM

## 2019-07-13 NOTE — Progress Notes (Signed)
[facility] PROGRESS NOTE    Progress Note  Jeffrey Larson  ACZ:[id number] DOB: 1956-05-12 DOA: 07/08/2019 PCP: Jeffrey Reeve, DO     Brief Narrative:   Jeffrey Larson is an 63 y.o. male past medical history significant for atrial fibrillation status post Maze procedure, metallic mitral valve replacement, tricuspid repair, essential hypertension obesity chronic diastolic heart failure presents to the hospital with shortness of breath.  The patient was tested positive for COVID-19 was admitted to [facility] now recovering at home, he has been feeling sick for 5 days prior to admission with fever chills fatigue over the last 3 days and getting progressive shortness of breath. Symptoms started 8.8.2020 Medications started:  07/08/2019 IV Remdesivir 07/08/2019 oral Decadron 07/08/2019 Actemra  Assessment/Plan:   Acute respiratory failure with hypoxia ([place]) due to COVID-19 virus infection: Patient has multiple risk factors for acute organ decompensation and poor prognosis including age. Continue IV Remdesivir and oral Decadron, he received Actemra 07/08/2019. His inflammatory markers were improving except for his d-dimer which is greater than 20. Requiring 15 L of high flow nasal cannula to keep saturation above 90%. Try to keep the patient preferably 16 hours a day. His d-dimer jumped from 3->20, currently he is on Coumadin for mechanical metallic valve with a goal INR of 2.5-3.5, today his INR is 4.6. His left lower extremity appears bigger than his right lower extremity, will get a lower extremity Doppler.  Acute kidney injury: With a baseline creatinine of less than 1. Likely prerenal azotemia in the setting of torsemide Aldactone and Entresto. Creatinine has returned to baseline with fluid resuscitation.  Continue to hold diuretic therapy.  New transaminitis: He is currently asymptomatic likely due to COVID-19 infection. There are trended down continue conservative  management.  Hypovolemic hyponatremia: Resolved with IV fluid hydration.  Essential hypertension, benign Blood pressure stable, continue metoprolol continue to hold diuretic therapy and Entresto.  COPD with chronic bronchitis ([place]) Asymptomatic currently on steroids no wheezing on physical exam.  History of atrial fibrillation status post Maze procedure valve repair/mechanical mitral valve/  S/P mitral valve replacement with metallic valve + tricuspid valve repair + maze procedure: He is currently rate controlled, his goal INR is 2.5-3.5. His Coumadin was held for 1 day and his INR started to improve he was given 1 mg of Coumadin and is 4.6 today.  His d-dimer jumped from 3.2-20 on 07/13/2019. New asymmetric lower extremity swelling in the left.  History of combined systolic and diastolic heart failure: Continue metoprolol, continue to hold  and Aldactone and Entresto. Restrict fluids, start torsemide BID.  Controlled type 2 diabetes mellitus without complication, without long-term current use of insulin ([place]): A1 6.5, blood glucose is fairly controlled continue sliding scale insulin.  DVT prophylaxis: coumadin goal INR 2.5-3.5 Family Communication:daughter Disposition Plan/Barrier to D/C: once off oxygen Code Status:     Code Status Orders  (From admission, onward)         Start     Ordered   07/08/19 2226  Full code  Continuous     07/08/19 2225        Code Status History    Date Active Date Inactive Code Status Order ID Comments User Context   07/18/2018 1524 07/31/2018 1626 Full Code [id number]  Jeffrey Alberts, MD Inpatient   06/10/2018 1352 06/17/2018 1649 Full Code [id number]  Jeffrey Dials, MD Inpatient   11/12/2016 1713 11/15/2016 1509 Full Code [id number]  Jeffrey Lose, MD Inpatient   Advance Care Planning  Activity        IV Access:    Peripheral IV   Procedures and diagnostic studies:   No results found.   Medical Consultants:    None.   Anti-Infectives:   None  Subjective:    Jeffrey Larson feels about the same compared to yesterday.  He has been ambulating in the room and with physical therapy.  Objective:    Vitals:   07/12/19 1733 07/12/19 1818 07/12/19 2056 07/13/19 0351  BP: (!) 97/57 106/62 (!) 103/48   Pulse:  67 67   Resp:  (!) 21 (!) 27   Temp: 98.1 F (36.7 C)  (!) 97.5 F (36.4 C) 97.6 F (36.4 C)  TempSrc: Oral  Oral Oral  SpO2:  93% 90%   Weight:      Height:       SpO2: 90 % O2 Flow Rate (L/min): 15 L/min   Intake/Output Summary (Last 24 hours) at 07/13/2019 0755 Last data filed at 07/13/2019 4098 Gross per 24 hour  Intake 1480 ml  Output 2001 ml  Net -521 ml   Filed Weights   07/08/19 1443  Weight: 136.1 kg    Exam: General exam: In no acute distress. Respiratory system: Good air movement and diffuse crackles bilaterally Cardiovascular system: S1 & S2 heard, RRR. No JVD. Gastrointestinal system: Abdomen is nondistended, soft and nontender.  Central nervous system: Alert and oriented. No focal neurological deficits. Extremities: 2+ lower extremity edema, his left lower extremity appears to be thicker than his right. Skin: No rashes, lesions or ulcers Psychiatry: Judgement and insight appear normal. Mood & affect appropriate.   Data Reviewed:    Labs: Basic Metabolic Panel: Recent Labs  Lab 07/09/19 0155 07/10/19 0243 07/11/19 0030 07/12/19 0236 07/13/19 0340  NA 133* 133* 135 136 137  K 3.8 4.3 4.6 5.1 5.2*  CL 94* 97* 98 95* 98  CO2 26 27 26 30 27   GLUCOSE 138* 126* 150* 159* 140*  BUN 36* 36* 31* 44* 38*  CREATININE 1.29* 0.94 0.96 1.17 1.00  CALCIUM 7.6* 8.2* 8.4* 8.6* 8.7*  MG 2.5* 2.6* 2.3 2.0 2.2  PHOS 3.0 2.5 3.1 4.3 4.7*   GFR Estimated Creatinine Clearance: 107.9 mL/min (by C-G formula based on SCr of 1 mg/dL). Liver Function Tests: Recent Labs  Lab 07/09/19 0155 07/10/19 0243 07/11/19 0030 07/12/19 0236 07/13/19 0340  AST 102* 105* 102* 86*  75*  ALT 71* 79* 87* 86* 81*  ALKPHOS 61 64 68 66 80  BILITOT 1.3* 1.1 0.6 0.6 1.0  PROT 6.2* 6.2* 6.3* 6.2* 6.5  ALBUMIN 2.9* 2.8* 2.8* 2.9* 3.0*   No results for input(s): LIPASE, AMYLASE in the last 168 hours. No results for input(s): AMMONIA in the last 168 hours. Coagulation profile Recent Labs  Lab 07/09/19 0155 07/10/19 0243 07/11/19 0030 07/12/19 0236 07/13/19 0340  INR 1.9* 3.6* 4.4* 4.1* 4.6*   COVID-19 Labs  Recent Labs    07/11/19 0030 07/12/19 0236 07/13/19 0340  DDIMER 1.75* 3.15* >20.00*  FERRITIN 4,902* 3,290*  --   LDH 763* 756* 915*  CRP 8.7* 4.5*  --     Lab Results  Component Value Date   SARSCOV2NAA Detected (A) 07/06/2019   [place] Not Detected 06/23/2019   [place] Not Detected 06/05/2019    CBC: Recent Labs  Lab 07/09/19 0155 07/10/19 0243 07/11/19 0030 07/12/19 0236 07/13/19 0340  WBC 9.8 14.3* 9.3 13.1* 18.4*  NEUTROABS 9.2* 13.3* 8.4* 11.2* 16.0*  HGB 10.7* 10.8*  11.4* 11.6* 12.1*  HCT 32.3* 33.2* 34.6* 36.2* 37.5*  MCV 88.7 89.5 90.3 91.0 91.0  PLT 258 343 391 372 373   Cardiac Enzymes: No results for input(s): CKTOTAL, CKMB, CKMBINDEX, TROPONINI in the last 168 hours. BNP (last 3 results) No results for input(s): PROBNP in the last 8760 hours. CBG: Recent Labs  Lab 07/11/19 2123 07/12/19 0753 07/12/19 1138 07/12/19 1716 07/12/19 2059  GLUCAP 170* 145* 127* 138* 196*   D-Dimer: Recent Labs    07/12/19 0236 07/13/19 0340  DDIMER 3.15* >20.00*   Hgb A1c: No results for input(s): HGBA1C in the last 72 hours. Lipid Profile: No results for input(s): CHOL, HDL, LDLCALC, TRIG, CHOLHDL, LDLDIRECT in the last 72 hours. Thyroid function studies: No results for input(s): TSH, T4TOTAL, T3FREE, THYROIDAB in the last 72 hours.  Invalid input(s): FREET3 Anemia work up: Recent Labs    07/11/19 0030 07/12/19 0236  FERRITIN 4,902* 3,290*   Sepsis Labs: Recent Labs  Lab 07/08/19 1456  07/10/19 0243 07/11/19  0030 07/12/19 0236 07/13/19 0340  PROCALCITON 0.14  --   --   --   --   --   WBC 10.1   < > 14.3* 9.3 13.1* 18.4*  LATICACIDVEN 1.5  --   --   --   --   --    < > = values in this interval not displayed.   Microbiology Recent Results (from the past 240 hour(s))  Novel Coronavirus, NAA ([facility])     Status: Abnormal   Collection Time: 07/06/19 12:45 PM   Specimen: Oropharyngeal(OP) collection in vial transport medium  Result Value Ref Range Status   SARS-CoV-2, NAA Detected (A) Not Detected Final    Comment: This test was developed and its performance characteristics determined by [employer]. This test has not been FDA cleared or approved. This test has been authorized by FDA under an Emergency Use Authorization (EUA). This test is only authorized for the duration of time the declaration that circumstances exist justifying the authorization of the emergency use of in vitro diagnostic tests for detection of SARS-CoV-2 virus and/or diagnosis of COVID-19 infection under section 564(b)(1) of the Act, 21 U.S.C. 174BSW-9(Q)(7), unless the authorization is terminated or revoked sooner. When diagnostic testing is negative, the possibility of a false negative result should be considered in the context of a patient's recent exposures and the presence of clinical signs and symptoms consistent with COVID-19. An individual without symptoms of COVID-19 and who is not shedding SARS-CoV-2 virus would expect to have a negative (not detected) result in this assay.   Blood Culture (routine x 2)     Status: None (Preliminary result)   Collection Time: 07/08/19  2:56 PM   Specimen: Right Antecubital; Blood  Result Value Ref Range Status   Specimen Description RIGHT ANTECUBITAL  Final   Special Requests   Final    BOTTLES DRAWN AEROBIC AND ANAEROBIC Blood Culture adequate volume   Culture   Final    NO GROWTH 3 DAYS Performed at [facility], [address], [place], [place] [address]     Report Status PENDING  Incomplete  Blood Culture (routine x 2)     Status: None (Preliminary result)   Collection Time: 07/08/19  3:29 PM   Specimen: BLOOD RIGHT HAND  Result Value Ref Range Status   Specimen Description BLOOD RIGHT HAND  Final   Special Requests   Final    BOTTLES DRAWN AEROBIC AND ANAEROBIC Blood Culture adequate volume  Culture   Final    NO GROWTH 3 DAYS Performed at [facility], [address] [place], [place] [address]    Report Status PENDING  Incomplete     Medications:   . albuterol  2 puff Inhalation Q6H  . aspirin EC  81 mg Oral Daily  . atorvastatin  40 mg Oral QPM  . dexamethasone (DECADRON) injection  6 mg Intravenous Q12H  . fluticasone furoate-vilanterol  1 puff Inhalation Daily   And  . umeclidinium bromide  1 puff Inhalation Daily  . insulin aspart  0-15 Units Subcutaneous TID WC  . insulin aspart  0-5 Units Subcutaneous QHS  . insulin aspart  4 Units Subcutaneous TID WC  . metoprolol succinate  50 mg Oral Daily  . Warfarin - Pharmacist Dosing Inpatient   Does not apply q1800   Continuous Infusions:     LOS: 5 days   Charlynne Cousins  [facility]  07/13/2019, 7:55 AM

## 2019-07-13 NOTE — Progress Notes (Signed)
PT Cancellation Note  Patient Details Name: Jeffrey Larson MRN: [id number] DOB: 09-23-1956   Cancelled Treatment:    Reason Eval/Treat Not Completed: Patient at procedure or test/unavailable   Claretha Cooper 07/13/2019, 4:29 PM

## 2019-07-13 NOTE — Progress Notes (Signed)
ANTICOAGULATION CONSULT NOTE - Follow Up Consult  Pharmacy Consult for warfarin Indication: Afib, mechanical mitral valve replacement  Allergies  Allergen Reactions  . Ramipril Cough    cough  . Percocet [Oxycodone-Acetaminophen]     In ICU it contributed to hallucinations. Can take by itself    Patient Measurements: Height: 5\' 11"  (180.3 cm) Weight: 300 lb (136.1 kg) IBW/kg (Calculated) : 75.3   Vital Signs: Temp: 97.6 F (36.4 C) (08/17 0810) Temp Source: Oral (08/17 0810) BP: 114/62 (08/17 0810) Pulse Rate: 59 (08/17 0952)  Labs: Recent Labs    07/11/19 0030 07/12/19 0236 07/13/19 0340  HGB 11.4* 11.6* 12.1*  HCT 34.6* 36.2* 37.5*  PLT 391 372 373  LABPROT 41.2* 38.9* 42.6*  INR 4.4* 4.1* 4.6*  CREATININE 0.96 1.17 1.00    Estimated Creatinine Clearance: 107.9 mL/min (by C-G formula based on SCr of 1 mg/dL).   Medications:  Scheduled:  . albuterol  2 puff Inhalation Q6H  . aspirin EC  81 mg Oral Daily  . atorvastatin  40 mg Oral QPM  . dexamethasone (DECADRON) injection  6 mg Intravenous Q12H  . fluticasone furoate-vilanterol  1 puff Inhalation Daily   And  . umeclidinium bromide  1 puff Inhalation Daily  . insulin aspart  0-15 Units Subcutaneous TID WC  . insulin aspart  0-5 Units Subcutaneous QHS  . insulin aspart  4 Units Subcutaneous TID WC  . metoprolol succinate  50 mg Oral Daily  . torsemide  40 mg Oral BID  . Warfarin - Pharmacist Dosing Inpatient   Does not apply q1800   Infusions:    Assessment: 35 yoM admitted on 8/12 for COVID-19 pneumonia with PMH of Afib, mitral valve replacement with metallic valve, tricuspid valve repair, hypertension, COPD, CAD, hyperlipidemia, obesity, chronic diastolic CHF.  Pharmacy is consulted to continue warfarin dosing. PTA warfarin: 4mg  daily except 6 mg on Mon/Thursday.  Unknown last dose.  Admission INR 1.8  INR increased again to 4.6. Hgb stable at 12.1, d-dimer increased to >20. We will hold coumadin  today  Goal of Therapy:  INR 2.5-3.5 Monitor platelets by anticoagulation protocol: Yes   Plan:  No coumadin today Daily PT/INR. Monitor for signs and symptoms of bleeding.  Onnie Boer, PharmD, BCIDP, AAHIVP, CPP Infectious Disease Pharmacist 07/13/2019 10:59 AM

## 2019-07-14 LAB — GLUCOSE, CAPILLARY
Glucose-Capillary: 151 mg/dL — ABNORMAL HIGH (ref 70–99)
Glucose-Capillary: 176 mg/dL — ABNORMAL HIGH (ref 70–99)
Glucose-Capillary: 117 mg/dL — ABNORMAL HIGH (ref 70–99)
Glucose-Capillary: 201 mg/dL — ABNORMAL HIGH (ref 70–99)

## 2019-07-14 LAB — CBC WITH DIFFERENTIAL/PLATELET
Abs Immature Granulocytes: 1.53 10*3/uL — ABNORMAL HIGH (ref 0.00–0.07)
Basophils Absolute: 0 10*3/uL (ref 0.0–0.1)
Basophils Relative: 0 %
Eosinophils Absolute: 0 10*3/uL (ref 0.0–0.5)
Eosinophils Relative: 0 %
HCT: 37.9 % — ABNORMAL LOW (ref 39.0–52.0)
Hemoglobin: 12.3 g/dL — ABNORMAL LOW (ref 13.0–17.0)
Immature Granulocytes: 8 %
Lymphocytes Relative: 3 %
Lymphs Abs: 0.5 10*3/uL — ABNORMAL LOW (ref 0.7–4.0)
MCH: 29 pg (ref 26.0–34.0)
MCHC: 32.5 g/dL (ref 30.0–36.0)
MCV: 89.4 fL (ref 80.0–100.0)
Monocytes Absolute: 0.3 10*3/uL (ref 0.1–1.0)
Monocytes Relative: 2 %
Neutro Abs: 16.9 10*3/uL — ABNORMAL HIGH (ref 1.7–7.7)
Neutrophils Relative %: 87 %
Platelets: 417 10*3/uL — ABNORMAL HIGH (ref 150–400)
RBC: 4.24 MIL/uL (ref 4.22–5.81)
RDW: 14.6 % (ref 11.5–15.5)
WBC: 19.3 10*3/uL — ABNORMAL HIGH (ref 4.0–10.5)
nRBC: 0.2 % (ref 0.0–0.2)

## 2019-07-14 LAB — COMPREHENSIVE METABOLIC PANEL
ALT: 78 U/L — ABNORMAL HIGH (ref 0–44)
AST: 64 U/L — ABNORMAL HIGH (ref 15–41)
Albumin: 3.2 g/dL — ABNORMAL LOW (ref 3.5–5.0)
Alkaline Phosphatase: 82 U/L (ref 38–126)
Anion gap: 15 (ref 5–15)
BUN: 48 mg/dL — ABNORMAL HIGH (ref 8–23)
CO2: 31 mmol/L (ref 22–32)
Calcium: 8.2 mg/dL — ABNORMAL LOW (ref 8.9–10.3)
Chloride: 89 mmol/L — ABNORMAL LOW (ref 98–111)
Creatinine, Ser: 1.21 mg/dL (ref 0.61–1.24)
GFR calc Af Amer: >60 mL/min (ref >60)
GFR calc non Af Amer: >60 mL/min (ref >60)
Glucose, Bld: 154 mg/dL — ABNORMAL HIGH (ref 70–99)
Potassium: 4.4 mmol/L (ref 3.5–5.1)
Sodium: 135 mmol/L (ref 135–145)
Total Bilirubin: 1 mg/dL (ref 0.3–1.2)
Total Protein: 6.5 g/dL (ref 6.5–8.1)

## 2019-07-14 LAB — C-REACTIVE PROTEIN: CRP: 1.2 mg/dL — ABNORMAL HIGH (ref <1.0)

## 2019-07-14 LAB — FERRITIN: Ferritin: 2267 ng/mL — ABNORMAL HIGH (ref 24–336)

## 2019-07-14 LAB — D-DIMER, QUANTITATIVE: D-Dimer, Quant: >20 ug/mL-FEU — ABNORMAL HIGH (ref 0.00–0.50)

## 2019-07-14 LAB — LACTATE DEHYDROGENASE: LDH: 892 U/L — ABNORMAL HIGH (ref 98–192)

## 2019-07-14 LAB — PROTIME-INR
INR: 5 (ref 0.8–1.2)
Prothrombin Time: 45.4 seconds — ABNORMAL HIGH (ref 11.4–15.2)

## 2019-07-14 LAB — ECHOCARDIOGRAM COMPLETE

## 2019-07-14 MED ORDER — TOCILIZUMAB 400 MG/20ML IV SOLN
800.0000 mg | Freq: Once | INTRAVENOUS | Status: DC
  Filled 2019-07-14: qty 40

## 2019-07-14 MED ORDER — PHYTONADIONE 1 MG/0.5 ML ORAL SOLUTION
0.5000 mg | Freq: Once | ORAL | Status: AC
  Administered 2019-07-14: 0.5 mg via ORAL
  Filled 2019-07-14: qty 0.5

## 2019-07-14 NOTE — Progress Notes (Signed)
ANTICOAGULATION CONSULT NOTE - Follow Up Consult  Pharmacy Consult for warfarin Indication: Afib, mechanical mitral valve replacement  Allergies  Allergen Reactions  . Ramipril Cough    cough  . Percocet [Oxycodone-Acetaminophen]     In ICU it contributed to hallucinations. Can take by itself    Patient Measurements: Height: 5\' 11"  (180.3 cm) Weight: 300 lb (136.1 kg) IBW/kg (Calculated) : 75.3   Vital Signs: Temp: 97.6 F (36.4 C) (08/18 1143) Temp Source: Oral (08/18 1143) BP: 97/63 (08/18 1143) Pulse Rate: 37 (08/18 1143)  Labs: Recent Labs    07/12/19 0236 07/13/19 0340 07/14/19 0225  HGB 11.6* 12.1* 12.3*  HCT 36.2* 37.5* 37.9*  PLT 372 373 417*  LABPROT 38.9* 42.6* 45.4*  INR 4.1* 4.6* 5.0*  CREATININE 1.17 1.00 1.21    Estimated Creatinine Clearance: 89.2 mL/min (by C-G formula based on SCr of 1.21 mg/dL).   Medications:  Scheduled:  . albuterol  2 puff Inhalation Q6H  . aspirin EC  81 mg Oral Daily  . atorvastatin  40 mg Oral QPM  . dexamethasone (DECADRON) injection  6 mg Intravenous Q12H  . fluticasone furoate-vilanterol  1 puff Inhalation Daily   And  . umeclidinium bromide  1 puff Inhalation Daily  . insulin aspart  0-15 Units Subcutaneous TID WC  . insulin aspart  0-5 Units Subcutaneous QHS  . insulin aspart  4 Units Subcutaneous TID WC  . metoprolol succinate  50 mg Oral Daily  . sodium chloride flush  10-40 mL Intracatheter Q12H  . Warfarin - Pharmacist Dosing Inpatient   Does not apply q1800   Infusions:    Assessment: 5 yoM admitted on 8/12 for COVID-19 pneumonia with PMH of Afib, mitral valve replacement with metallic valve, tricuspid valve repair, hypertension, COPD, CAD, hyperlipidemia, obesity, chronic diastolic CHF.  Pharmacy is consulted to continue warfarin dosing. PTA warfarin: 4mg  daily except 6 mg on Mon/Thursday.  Unknown last dose.  Admission INR 1.8  INR increased again to 5. Hgb stable at 12.1, d-dimer remains >20. Hgb  remains steady. No drug interaction noted. We will hold again today. MD ordered vit k 0.5mg  x1   Goal of Therapy:  INR 2.5-3.5 Monitor platelets by anticoagulation protocol: Yes   Plan:  No coumadin today Vit k 0.5mg  PO x1 per MD Daily PT/INR. Monitor for signs and symptoms of bleeding.  Onnie Boer, PharmD, BCIDP, AAHIVP, CPP Infectious Disease Pharmacist 07/14/2019 11:57 AM

## 2019-07-14 NOTE — Progress Notes (Signed)
CRITICAL VALUE ALERT  Critical Value:  INR- 5.0  Date & Time Notied:  07/14/19  Provider Notified: Dr. Aileen Fass  Orders Received/Actions taken: Currently evaluating.

## 2019-07-14 NOTE — Progress Notes (Signed)
Physical Therapy Treatment Patient Details Name: Jeffrey Larson MRN: [id number] DOB: 15-Oct-1956 Today's Date: 07/14/2019    History of Present Illness Jeffrey Larson is an 63 y.o. male past medical history : atrial fibrillation , metallic mitral valve replacement, tricuspid repair, essential hypertension ,obesity, chronic diastolic heart failure presents to the hospital 07/08/19 with shortness of breath, hypoxia,fever chills,Positive for covid-19 07/06/19,recovering at home,    PT Comments    The patient likes to ambulate, ended up at 300' in the room.. Sao2 dropping to 70's on 8 L, increased back to 10 with increase back into 80%.  Will attempt ambulating shorter distances at one time.and decrease O2  To see if pt does not desat as much. Continue mobility.   Follow Up Recommendations  Home health PT     Equipment Recommendations  None recommended by PT    Recommendations for Other Services       Precautions / Restrictions Precautions Precaution Comments: INR can be high, trying to wean O2, try nelcor,was about the same as finger except at the end of walk was a little higher    Mobility  Bed Mobility                  Transfers                    Ambulation/Gait Ambulation/Gait assistance: Supervision Gait Distance (Feet): 300 Feet(in room) Assistive device: None Gait Pattern/deviations: WFL(Within Functional Limits)     General Gait Details: patient ambulated on 10 then 8 L O2 dropping to 71%/ increased back to 10 with rise back into 80's. Plan will be to ambulate shorter distance and see if O2 can be decreased and spO2 will stay up.   Stairs             Wheelchair Mobility    Modified Rankin (Stroke Patients Only)       Balance                                            Cognition                                              Exercises      General Comments        Pertinent Vitals/Pain  Pain Assessment: No/denies pain    Home Living                      Prior Function            PT Goals (current goals can now be found in the care plan section) Progress towards PT goals: Progressing toward goals    Frequency    Min 3X/week      PT Plan Current plan remains appropriate    Co-evaluation              AM-PAC PT "6 Clicks" Mobility   Outcome Measure  Help needed turning from your back to your side while in a flat bed without using bedrails?: None Help needed moving from lying on your back to sitting on the side of a flat bed without using bedrails?: None Help needed moving to and from a bed to a chair (including a wheelchair)?:  None Help needed standing up from a chair using your arms (e.g., wheelchair or bedside chair)?: None Help needed to walk in hospital room?: A Little Help needed climbing 3-5 steps with a railing? : A Little 6 Click Score: 22    End of Session Equipment Utilized During Treatment: Oxygen Activity Tolerance: Patient tolerated treatment well Patient left: in chair;with call bell/phone within reach Nurse Communication: Mobility status       Time: 3875-6433 PT Time Calculation (min) (ACUTE ONLY): 29 min  Charges:  $Gait Training: 23-37 mins                     [place] Pager [phone] Office [phone]    Claretha Cooper 07/14/2019, 5:49 PM

## 2019-07-14 NOTE — Progress Notes (Signed)
Physical Therapy Treatment Patient Details Name: Jeffrey Larson MRN: [id number] DOB: 17-Jun-1956 Today's Date: 07/14/2019    History of Present Illness Jeffrey Larson is an 63 y.o. male past medical history : atrial fibrillation , metallic mitral valve replacement, tricuspid repair, essential hypertension ,obesity, chronic diastolic heart failure presents to the hospital 07/08/19 with shortness of breath, hypoxia,fever chills,Positive for covid-19 07/06/19,recovering at home,    PT Comments    The patient  Ambulated x 240' in room. In advertently, The O2 tank in room was empty at end of the walk. Unsure if empty the entire walk. Patient was not noted to be increased SOB, SPO2 readings inconsistent but at times, in the 70's. Placed on 12 then10 L HFNC with SPO2 back to > 90%. Will monitor on forehead next visit. Continue PT and trials of weaning O2 as tolerated.   Follow Up Recommendations  No PT follow up     Equipment Recommendations  None recommended by PT    Recommendations for Other Services       Precautions / Restrictions Precautions Precaution Comments: INR can be high, trying to wean O2, try nelcor    Mobility  Bed Mobility Overal bed mobility: Independent                Transfers Overall transfer level: Independent                  Ambulation/Gait Ambulation/Gait assistance: Supervision   Assistive device: None Gait Pattern/deviations: WFL(Within Functional Limits) Gait velocity: decr   General Gait Details: Assist to manage lines and monitor O2. Pt amb in room on RA as the O2 tank was noted to be empty.SpO2 77% when it picks up, mostly not registering from finger probe.  Placed on 12 L with ?SP2 returning to 99% within 3 minutes and deep breaths   Stairs             Wheelchair Mobility    Modified Rankin (Stroke Patients Only)       Balance                                            Cognition  Arousal/Alertness: Awake/alert                                            Exercises      General Comments        Pertinent Vitals/Pain Pain Assessment: No/denies pain    Home Living                      Prior Function            PT Goals (current goals can now be found in the care plan section) Progress towards PT goals: Progressing toward goals    Frequency    Min 3X/week      PT Plan Current plan remains appropriate    Co-evaluation              AM-PAC PT "6 Clicks" Mobility   Outcome Measure  Help needed turning from your back to your side while in a flat bed without using bedrails?: None Help needed moving from lying on your back to sitting on the side of a flat bed  without using bedrails?: None Help needed moving to and from a bed to a chair (including a wheelchair)?: None   Help needed to walk in hospital room?: A Little Help needed climbing 3-5 steps with a railing? : A Little 6 Click Score: 18    End of Session Equipment Utilized During Treatment: Oxygen(which was empty) Activity Tolerance: Patient tolerated treatment well Patient left: in chair;with call bell/phone within reach Nurse Communication: Mobility status PT Visit Diagnosis: Difficulty in walking, not elsewhere classified (R26.2)     Time: 0052-5910 PT Time Calculation (min) (ACUTE ONLY): 36 min  Charges:  $Gait Training: 23-37 mins                     [place]  Office [phone]    Claretha Cooper 07/14/2019, 1:41 PM

## 2019-07-14 NOTE — Progress Notes (Signed)
[facility] PROGRESS NOTE    Progress Note  Jeffrey Larson  Jeffrey Larson:[id number] DOB: 04-08-1956 DOA: 07/08/2019 PCP: [facility]     Brief Narrative:   Jeffrey Larson is an 63 y.o. male past medical history significant for atrial fibrillation status post Maze procedure, metallic mitral valve replacement, tricuspid repair, essential hypertension obesity chronic diastolic heart failure presents to [facility] with shortness of breath.  The patient was tested positive for COVID-19 was admitted to [facility] now recovering at home, he has been feeling sick for 5 days prior to admission with fever chills fatigue over the last 3 days and getting progressive shortness of breath. Symptoms started 8.8.2020 Medications started:  07/08/2019 IV Remdesivir 07/08/2019 oral Decadron 07/08/2019  Actemra  Assessment/Plan:   Acute respiratory failure with hypoxia ([facility]) due to COVID-19 virus infection: Patient has multiple risk factors for acute organ decompensation and poor prognosis including age. He completed a course of IV Remdesivir and continue oral Decadron. He continues to require 15 L high flow nasal cannula to keep saturations above 92%. INR was significantly elevated at 1 from 3-20 he is on Coumadin and his INR is supratherapeutic, his lower extremity Doppler shows no DVT. He desaturates over overnight, probably as he has obstructive sleep apnea and he is now wearing his CPAP.  Acute kidney injury: With a baseline creatinine of less than 1. Likely prerenal in the setting of torsemide, Aldactone and Entresto. Has improved with holding diuretic therapy and IV fluid hydration. Continue to hold diuretic and Entresto therapy.  New transaminitis: He is currently asymptomatic likely due to COVID-19 infection. LFTs are slowly improving continue conservative management.  Hypovolemic hyponatremia: Likely due to hypovolemia now resolved.  Essential hypertension, benign Pressure stable  continue to hold all antihypertensive medication except his metoprolol.  COPD with chronic bronchitis ([facility]) Asymptomatic currently on steroids no wheezing on physical exam.  History of atrial fibrillation status post Maze procedure valve repair/mechanical mitral valve/  S/P mitral valve replacement with metallic valve + tricuspid valve repair + maze procedure: He is currently rate controlled, his goal INR is 2.5-3.5. His INR is slowly rising to 5.0 today he did not receive Coumadin yesterday. We will give a very small dose of vitamin K. Continue to check INRs daily.  History of combined systolic and diastolic heart failure: Continue metoprolol, continue to hold Aldactone Entresto and torsemide.  Controlled type 2 diabetes mellitus without complication, without long-term current use of insulin ([facility]): A1 6.5, good control of his blood glucose continue long-acting sample sliding scale.  DVT prophylaxis: coumadin goal INR 2.5-3.5 Family Communication:daughter Disposition Plan/Barrier to D/C: once off oxygen Code Status:     Code Status Orders  (From admission, onward)         Start     Ordered   07/08/19 2226  Full code  Continuous     07/08/19 2225        Code Status History    Date Active Date Inactive Code Status Order ID Comments User Context   07/18/2018 1524 07/31/2018 1626 Full Code [id number]  Jeffrey Alberts, MD Inpatient   06/10/2018 1352 06/17/2018 1649 Full Code [id number]  Jeffrey Dials, MD Inpatient   11/12/2016 1713 11/15/2016 1509 Full Code [id number]  Jeffrey Lose, MD Inpatient   Advance Care Planning Activity        IV Access:    Peripheral IV   Procedures and diagnostic studies:   [place] Ekg Site Rite  Result Date: 07/13/2019 If Site  Rite image not attached, placement could not be confirmed due to current cardiac rhythm.    Medical Consultants:    None.  Anti-Infectives:   None  Subjective:    Jeffrey Larson he says he feels he could  be on 2 L of oxygen and feels fine.  Objective:    Vitals:   07/13/19 1147 07/13/19 1621 07/13/19 1927 07/14/19 0508  BP: 102/68 108/70 (!) 98/59 (!) 90/38  Pulse: (!) 33   (!) 50  Resp: 19   19  Temp: (!) 97.5 F (36.4 C) 98 F (36.7 C) 97.8 F (36.6 C) 98.2 F (36.8 C)  TempSrc: Oral Oral Oral Oral  SpO2: 99% (!) 88%  94%  Weight:      Height:       SpO2: 94 % O2 Flow Rate (L/min): 15 L/min   Intake/Output Summary (Last 24 hours) at 07/14/2019 0745 Last data filed at 07/14/2019 0329 Gross per 24 hour  Intake 200 ml  Output 5500 ml  Net -5300 ml   Filed Weights   07/08/19 1443  Weight: 136.1 kg    Exam: General exam: In no acute distress. Respiratory system: Good air movement and diffuse crackles. Cardiovascular system: S1 & S2 heard, RRR. No JVD. Gastrointestinal system: Abdomen is nondistended, soft and nontender.  Central nervous system: Alert and oriented. No focal neurological deficits. Extremities: No pedal edema. Skin: No rashes, lesions or ulcers Psychiatry: Judgement and insight appear normal. Mood & affect appropriate.   Data Reviewed:    Labs: Basic Metabolic Panel: Recent Labs  Lab 07/09/19 0155 07/10/19 0243 07/11/19 0030 07/12/19 0236 07/13/19 0340 07/14/19 0225  NA 133* 133* 135 136 137 135  K 3.8 4.3 4.6 5.1 5.2* 4.4  CL 94* 97* 98 95* 98 89*  CO2 26 27 26 30 27 31   GLUCOSE 138* 126* 150* 159* 140* 154*  BUN 36* 36* 31* 44* 38* 48*  CREATININE 1.29* 0.94 0.96 1.17 1.00 1.21  CALCIUM 7.6* 8.2* 8.4* 8.6* 8.7* 8.2*  MG 2.5* 2.6* 2.3 2.0 2.2  --   PHOS 3.0 2.5 3.1 4.3 4.7*  --    GFR Estimated Creatinine Clearance: 89.2 mL/min (by C-G formula based on SCr of 1.21 mg/dL). Liver Function Tests: Recent Labs  Lab 07/10/19 0243 07/11/19 0030 07/12/19 0236 07/13/19 0340 07/14/19 0225  AST 105* 102* 86* 75* 64*  ALT 79* 87* 86* 81* 78*  ALKPHOS 64 68 66 80 82  BILITOT 1.1 0.6 0.6 1.0 1.0  PROT 6.2* 6.3* 6.2* 6.5 6.5  ALBUMIN  2.8* 2.8* 2.9* 3.0* 3.2*   No results for input(s): LIPASE, AMYLASE in the last 168 hours. No results for input(s): AMMONIA in the last 168 hours. Coagulation profile Recent Labs  Lab 07/10/19 0243 07/11/19 0030 07/12/19 0236 07/13/19 0340 07/14/19 0225  INR 3.6* 4.4* 4.1* 4.6* 5.0*   COVID-19 Labs  Recent Labs    07/12/19 0236 07/13/19 0340 07/14/19 0225  DDIMER 3.15* >20.00* >20.00*  FERRITIN 3,290* 3,332*  --   LDH 756* 915* 892*  CRP 4.5* 2.3*  --     Lab Results  Component Value Date   SARSCOV2NAA Detected (A) 07/06/2019   [place] Not Detected 06/23/2019   Scalp Level Not Detected 06/05/2019    CBC: Recent Labs  Lab 07/10/19 0243 07/11/19 0030 07/12/19 0236 07/13/19 0340 07/14/19 0225  WBC 14.3* 9.3 13.1* 18.4* 19.3*  NEUTROABS 13.3* 8.4* 11.2* 16.0* 16.9*  HGB 10.8* 11.4* 11.6* 12.1* 12.3*  HCT 33.2* 34.6* 36.2*  37.5* 37.9*  MCV 89.5 90.3 91.0 91.0 89.4  PLT 343 391 372 373 417*   Cardiac Enzymes: No results for input(s): CKTOTAL, CKMB, CKMBINDEX, TROPONINI in the last 168 hours. BNP (last 3 results) No results for input(s): PROBNP in the last 8760 hours. CBG: Recent Labs  Lab 07/12/19 2059 07/13/19 0808 07/13/19 1141 07/13/19 1646 07/13/19 2123  GLUCAP 196* 130* 121* 110* 185*   D-Dimer: Recent Labs    07/13/19 0340 07/14/19 0225  DDIMER >20.00* >20.00*   Hgb A1c: No results for input(s): HGBA1C in the last 72 hours. Lipid Profile: No results for input(s): CHOL, HDL, LDLCALC, TRIG, CHOLHDL, LDLDIRECT in the last 72 hours. Thyroid function studies: No results for input(s): TSH, T4TOTAL, T3FREE, THYROIDAB in the last 72 hours.  Invalid input(s): FREET3 Anemia work up: Recent Labs    07/12/19 0236 07/13/19 0340  FERRITIN 3,290* 3,332*   Sepsis Labs: Recent Labs  Lab 07/08/19 1456  07/11/19 0030 07/12/19 0236 07/13/19 0340 07/14/19 0225  PROCALCITON 0.14  --   --   --   --   --   WBC 10.1   < > 9.3 13.1* 18.4* 19.3*   LATICACIDVEN 1.5  --   --   --   --   --    < > = values in this interval not displayed.   Microbiology Recent Results (from the past 240 hour(s))  Novel Coronavirus, NAA ([facility])     Status: Abnormal   Collection Time: 07/06/19 12:45 PM   Specimen: Oropharyngeal(OP) collection in vial transport medium  Result Value Ref Range Status   SARS-CoV-2, NAA Detected (A) Not Detected Final    Comment: This test was developed and its performance characteristics determined by [employer]. This test has not been FDA cleared or approved. This test has been authorized by FDA under an Emergency Use Authorization (EUA). This test is only authorized for the duration of time the declaration that circumstances exist justifying the authorization of the emergency use of in vitro diagnostic tests for detection of SARS-CoV-2 virus and/or diagnosis of COVID-19 infection under section 564(b)(1) of the Act, 21 U.S.C. 628ZMO-2(H)(4), unless the authorization is terminated or revoked sooner. When diagnostic testing is negative, the possibility of a false negative result should be considered in the context of a patient's recent exposures and the presence of clinical signs and symptoms consistent with COVID-19. An individual without symptoms of COVID-19 and who is not shedding SARS-CoV-2 virus would expect to have a negative (not detected) result in this assay.   Blood Culture (routine x 2)     Status: None   Collection Time: 07/08/19  2:56 PM   Specimen: Right Antecubital; Blood  Result Value Ref Range Status   Specimen Description RIGHT ANTECUBITAL  Final   Special Requests   Final    BOTTLES DRAWN AEROBIC AND ANAEROBIC Blood Culture adequate volume   Culture   Final    NO GROWTH 5 DAYS Performed at [facility], [address], [place], [place] [address]    Report Status 07/13/2019 FINAL  Final  Blood Culture (routine x 2)     Status: None   Collection Time: 07/08/19  3:29 PM   Specimen:  BLOOD RIGHT HAND  Result Value Ref Range Status   Specimen Description BLOOD RIGHT HAND  Final   Special Requests   Final    BOTTLES DRAWN AEROBIC AND ANAEROBIC Blood Culture adequate volume   Culture   Final    NO GROWTH 5 DAYS  Performed at [facility], [address] [place], [place] [address]    Report Status 07/13/2019 FINAL  Final     Medications:   . albuterol  2 puff Inhalation Q6H  . aspirin EC  81 mg Oral Daily  . atorvastatin  40 mg Oral QPM  . dexamethasone (DECADRON) injection  6 mg Intravenous Q12H  . fluticasone furoate-vilanterol  1 puff Inhalation Daily   And  . umeclidinium bromide  1 puff Inhalation Daily  . insulin aspart  0-15 Units Subcutaneous TID WC  . insulin aspart  0-5 Units Subcutaneous QHS  . insulin aspart  4 Units Subcutaneous TID WC  . metoprolol succinate  50 mg Oral Daily  . sodium chloride flush  10-40 mL Intracatheter Q12H  . torsemide  40 mg Oral BID  . Warfarin - Pharmacist Dosing Inpatient   Does not apply q1800   Continuous Infusions:     LOS: 6 days   Charlynne Cousins  [facility]  07/14/2019, 7:45 AM

## 2019-07-15 ENCOUNTER — Encounter (HOSPITAL_COMMUNITY): Payer: BC Managed Care – PPO

## 2019-07-15 DIAGNOSIS — J449 Chronic obstructive pulmonary disease, unspecified: Secondary | ICD-10-CM

## 2019-07-15 LAB — COMPREHENSIVE METABOLIC PANEL
ALT: 64 U/L — ABNORMAL HIGH (ref 0–44)
AST: 46 U/L — ABNORMAL HIGH (ref 15–41)
Albumin: 3.1 g/dL — ABNORMAL LOW (ref 3.5–5.0)
Alkaline Phosphatase: 66 U/L (ref 38–126)
Anion gap: 11 (ref 5–15)
BUN: 47 mg/dL — ABNORMAL HIGH (ref 8–23)
CO2: 33 mmol/L — ABNORMAL HIGH (ref 22–32)
Calcium: 8.5 mg/dL — ABNORMAL LOW (ref 8.9–10.3)
Chloride: 92 mmol/L — ABNORMAL LOW (ref 98–111)
Creatinine, Ser: 1.13 mg/dL (ref 0.61–1.24)
GFR calc Af Amer: >60 mL/min (ref >60)
GFR calc non Af Amer: >60 mL/min (ref >60)
Glucose, Bld: 159 mg/dL — ABNORMAL HIGH (ref 70–99)
Potassium: 4.6 mmol/L (ref 3.5–5.1)
Sodium: 136 mmol/L (ref 135–145)
Total Bilirubin: 1.2 mg/dL (ref 0.3–1.2)
Total Protein: 6.3 g/dL — ABNORMAL LOW (ref 6.5–8.1)

## 2019-07-15 LAB — CBC WITH DIFFERENTIAL/PLATELET
Abs Immature Granulocytes: 1.69 10*3/uL — ABNORMAL HIGH (ref 0.00–0.07)
Basophils Absolute: 0.1 10*3/uL (ref 0.0–0.1)
Basophils Relative: 1 %
Eosinophils Absolute: 0 10*3/uL (ref 0.0–0.5)
Eosinophils Relative: 0 %
HCT: 37.4 % — ABNORMAL LOW (ref 39.0–52.0)
Hemoglobin: 12.1 g/dL — ABNORMAL LOW (ref 13.0–17.0)
Immature Granulocytes: 8 %
Lymphocytes Relative: 2 %
Lymphs Abs: 0.5 10*3/uL — ABNORMAL LOW (ref 0.7–4.0)
MCH: 28.9 pg (ref 26.0–34.0)
MCHC: 32.4 g/dL (ref 30.0–36.0)
MCV: 89.5 fL (ref 80.0–100.0)
Monocytes Absolute: 0.4 10*3/uL (ref 0.1–1.0)
Monocytes Relative: 2 %
Neutro Abs: 18.2 10*3/uL — ABNORMAL HIGH (ref 1.7–7.7)
Neutrophils Relative %: 87 %
Platelets: 377 10*3/uL (ref 150–400)
RBC: 4.18 MIL/uL — ABNORMAL LOW (ref 4.22–5.81)
RDW: 14.6 % (ref 11.5–15.5)
WBC: 21 10*3/uL — ABNORMAL HIGH (ref 4.0–10.5)
nRBC: 0.1 % (ref 0.0–0.2)

## 2019-07-15 LAB — GLUCOSE, CAPILLARY
Glucose-Capillary: 149 mg/dL — ABNORMAL HIGH (ref 70–99)
Glucose-Capillary: 154 mg/dL — ABNORMAL HIGH (ref 70–99)
Glucose-Capillary: 130 mg/dL — ABNORMAL HIGH (ref 70–99)
Glucose-Capillary: 178 mg/dL — ABNORMAL HIGH (ref 70–99)

## 2019-07-15 LAB — LACTATE DEHYDROGENASE: LDH: 639 U/L — ABNORMAL HIGH (ref 98–192)

## 2019-07-15 LAB — PROTIME-INR
INR: 2.5 — ABNORMAL HIGH (ref 0.8–1.2)
INR: 2.1 — ABNORMAL HIGH (ref 0.8–1.2)
Prothrombin Time: 26.7 seconds — ABNORMAL HIGH (ref 11.4–15.2)
Prothrombin Time: 22.9 seconds — ABNORMAL HIGH (ref 11.4–15.2)

## 2019-07-15 LAB — D-DIMER, QUANTITATIVE: D-Dimer, Quant: 12.08 ug/mL-FEU — ABNORMAL HIGH (ref 0.00–0.50)

## 2019-07-15 LAB — FERRITIN: Ferritin: 1933 ng/mL — ABNORMAL HIGH (ref 24–336)

## 2019-07-15 LAB — C-REACTIVE PROTEIN: CRP: <0.8 mg/dL (ref <1.0)

## 2019-07-15 MED ORDER — ENOXAPARIN SODIUM 150 MG/ML ~~LOC~~ SOLN
130.0000 mg | Freq: Two times a day (BID) | SUBCUTANEOUS | Status: DC
  Filled 2019-07-15 (×2): qty 0.87

## 2019-07-15 MED ORDER — WARFARIN SODIUM 4 MG PO TABS
4.0000 mg | ORAL_TABLET | Freq: Once | ORAL | Status: AC
  Administered 2019-07-15: 4 mg via ORAL
  Filled 2019-07-15: qty 1

## 2019-07-15 MED ORDER — ENOXAPARIN SODIUM 150 MG/ML ~~LOC~~ SOLN
135.0000 mg | Freq: Two times a day (BID) | SUBCUTANEOUS | Status: DC
  Administered 2019-07-15: 135 mg via SUBCUTANEOUS
  Filled 2019-07-15 (×2): qty 0.9

## 2019-07-15 MED ORDER — LIP MEDEX EX OINT
TOPICAL_OINTMENT | CUTANEOUS | Status: DC | PRN
  Filled 2019-07-15: qty 7

## 2019-07-15 MED ORDER — ORAL CARE MOUTH RINSE
15.0000 mL | Freq: Two times a day (BID) | OROMUCOSAL | Status: DC
  Administered 2019-07-15 – 2019-07-18 (×7): 15 mL via OROMUCOSAL

## 2019-07-15 MED ORDER — SALINE SPRAY 0.65 % NA SOLN
1.0000 | NASAL | Status: DC | PRN
  Administered 2019-07-15: 1 via NASAL
  Filled 2019-07-15: qty 44

## 2019-07-15 NOTE — Progress Notes (Signed)
ANTICOAGULATION CONSULT NOTE - Follow Up Consult  Pharmacy Consult for warfarin Indication: Afib, mechanical mitral valve replacement  Allergies  Allergen Reactions  . Ramipril Cough    cough  . Percocet [Oxycodone-Acetaminophen]     In ICU it contributed to hallucinations. Can take by itself    Patient Measurements: Height: 5\' 11"  (180.3 cm) Weight: 300 lb (136.1 kg) IBW/kg (Calculated) : 75.3   Vital Signs: Temp: 98.1 F (36.7 C) (08/19 1148) Temp Source: Oral (08/19 1148) BP: 110/65 (08/19 1148) Pulse Rate: 125 (08/19 1148)  Labs: Recent Labs    07/13/19 0340 07/14/19 0225 07/15/19 0410  HGB 12.1* 12.3* 12.1*  HCT 37.5* 37.9* 37.4*  PLT 373 417* 377  LABPROT 42.6* 45.4* 26.7*  INR 4.6* 5.0* 2.5*  CREATININE 1.00 1.21 1.13    Estimated Creatinine Clearance: 95.5 mL/min (by C-G formula based on SCr of 1.13 mg/dL).   Medications:  Scheduled:  . albuterol  2 puff Inhalation Q6H  . aspirin EC  81 mg Oral Daily  . atorvastatin  40 mg Oral QPM  . dexamethasone (DECADRON) injection  6 mg Intravenous Q12H  . fluticasone furoate-vilanterol  1 puff Inhalation Daily   And  . umeclidinium bromide  1 puff Inhalation Daily  . insulin aspart  0-15 Units Subcutaneous TID WC  . insulin aspart  0-5 Units Subcutaneous QHS  . insulin aspart  4 Units Subcutaneous TID WC  . mouth rinse  15 mL Mouth Rinse BID  . metoprolol succinate  50 mg Oral Daily  . sodium chloride flush  10-40 mL Intracatheter Q12H  . Warfarin - Pharmacist Dosing Inpatient   Does not apply q1800   Infusions:    Assessment: 73 yoM admitted on 8/12 for COVID-19 pneumonia with PMH of Afib, mitral valve replacement with metallic valve, tricuspid valve repair, hypertension, COPD, CAD, hyperlipidemia, obesity, chronic diastolic CHF.  Pharmacy is consulted to continue warfarin dosing. PTA warfarin: 4mg  daily except 6 mg on Mon/Thursday.  Unknown last dose.  Admission INR 1.8  INR today has trended down to  2.5 after Vitamin K 0.5 mg IV yesterday. H/H low stable, Plt wnl. He is eating 100% of his diet   Goal of Therapy:  INR 2.5-3.5 Monitor platelets by anticoagulation protocol: Yes   Plan:  Warfarin 4 mg x 1 dose today. If INR drifts below 2.5, will need to initiate Lovenox again  Will recheck INR later this evening  Daily PT/INR. Monitor for signs and symptoms of bleeding.  Albertina Parr, PharmD., BCPS Clinical Pharmacist Clinical phone for 07/15/19 until 5pm: [phone]

## 2019-07-15 NOTE — Progress Notes (Signed)
PROGRESS NOTE  Jeffrey [facility]  PNT:[id number] DOB: 20-Oct-1956 DOA: 07/08/2019 PCP: [name], DO  Brief Narrative: Jeffrey Larson is a 63 y.o. male with a history of AFib s/p MAZE procedure and mechanical MVR, HTN, chronic HFpEF, OSA who presented with fever, chills and progressive shortness of breath in the setting of known covid-19 infection. His [facility] had been admitted at [facility] for covid pneumonia and he was subsequently admitted here as well. Remdesivir and decadron started on 8/12, tocilizumab was given 8/12.   Assessment & Plan: Active Problems:   Essential hypertension, benign   COPD with chronic bronchitis (HCC)   Acute respiratory failure with hypoxia (HCC)   Controlled type 2 diabetes mellitus without complication, without long-term current use of insulin (HCC)   Chronic combined systolic and diastolic congestive heart failure (HCC)   Persistent atrial fibrillation   S/P mitral valve replacement with metallic valve + tricuspid valve repair + maze procedure   2019 novel coronavirus disease (COVID-19)   COVID-19 virus infection  Acute hypoxemic respiratory failure due to covid-19 pneumonia: Significant risk of progressive failure/ARDS.  - Continue steroids, completed remdesivir and was given tocilizumab 8/12.  - Wean oxygen as able.  - Trend inflammatory markers which are showing signs of improvement.  - Prone as much as reasonable.  AKI: Improved with holding diuretic therapy.  - Continue to hold nephrotoxins and diuretics  History of atrial fibrillation s/p MAZE, mechanical MVR:  - Continue home medications  Supratherapeutic INR: Improving with holding coumadin and gave low dose vitamin K.  - Recheck INR daily, restart to maintain INR 2.5 - 3.5.   Chronic combined HFrEF:  - Continue metoprolol, holding diuretics, entresto.   Controlled T2DM:  - Continue insulin as ordered.   COPD:  - Steroids as above, augmented. continue scheduled inhalers, albuterol q6h  and prn.  LFT elevation: Mild, likely due to viral infection. Improving. - Continue to monitor.   DVT prophylaxis: Coumadin Code Status: Full Family Communication: None at bedside Disposition Plan: Pending improvement in hypoxia.   Consultants:   PCCM  Procedures:   None  Antimicrobials:  Remdesivir   Subjective: Feels stable-to-slightly better, still short of breath constantly, worse with exertion. Having success proning regularly. No chest pain. Denies hearing wheezing.   Objective: Vitals:   07/15/19 0404 07/15/19 0801 07/15/19 1148 07/15/19 1635  BP: (!) 97/46 108/66 110/65 115/75  Pulse: (!) 45 64 (!) 125 (!) 102  Resp: 16 17 19  (!) 22  Temp: 97.8 F (36.6 C) 98 F (36.7 C) 98.1 F (36.7 C) (!) 97.4 F (36.3 C)  TempSrc: Oral Oral Oral Oral  SpO2: 97% 100% 95% 99%  Weight:      Height:        Intake/Output Summary (Last 24 hours) at 07/15/2019 1644 Last data filed at 07/15/2019 0800 Gross per 24 hour  Intake 900 ml  Output 1850 ml  Net -950 ml   Filed Weights   07/08/19 1443  Weight: 136.1 kg    Gen: 63 y.o. male in no distress prone Pulm: Non-labored breathing supplemental oxygen. Coarse expiratory phase throughout  CV: Regular rate and rhythm. No murmur, rub, or gallop. No JVD, trace pedal edema. GI: Abdomen soft, non-tender, non-distended, with normoactive bowel sounds. No organomegaly or masses felt. Ext: Warm, no deformities Skin: No rashes, lesions no ulcers Neuro: Alert and oriented. No focal neurological deficits. Psych: Judgement and insight appear normal. Mood & affect appropriate.   Data Reviewed: I have personally reviewed following  labs and imaging studies  CBC: Recent Labs  Lab 07/11/19 0030 07/12/19 0236 07/13/19 0340 07/14/19 0225 07/15/19 0410  WBC 9.3 13.1* 18.4* 19.3* 21.0*  NEUTROABS 8.4* 11.2* 16.0* 16.9* 18.2*  HGB 11.4* 11.6* 12.1* 12.3* 12.1*  HCT 34.6* 36.2* 37.5* 37.9* 37.4*  MCV 90.3 91.0 91.0 89.4 89.5  PLT  391 372 373 417* 397   Basic Metabolic Panel: Recent Labs  Lab 07/09/19 0155 07/10/19 0243 07/11/19 0030 07/12/19 0236 07/13/19 0340 07/14/19 0225 07/15/19 0410  NA 133* 133* 135 136 137 135 136  K 3.8 4.3 4.6 5.1 5.2* 4.4 4.6  CL 94* 97* 98 95* 98 89* 92*  CO2 26 27 26 30 27 31  33*  GLUCOSE 138* 126* 150* 159* 140* 154* 159*  BUN 36* 36* 31* 44* 38* 48* 47*  CREATININE 1.29* 0.94 0.96 1.17 1.00 1.21 1.13  CALCIUM 7.6* 8.2* 8.4* 8.6* 8.7* 8.2* 8.5*  MG 2.5* 2.6* 2.3 2.0 2.2  --   --   PHOS 3.0 2.5 3.1 4.3 4.7*  --   --    GFR: Estimated Creatinine Clearance: 95.5 mL/min (by C-G formula based on SCr of 1.13 mg/dL). Liver Function Tests: Recent Labs  Lab 07/11/19 0030 07/12/19 0236 07/13/19 0340 07/14/19 0225 07/15/19 0410  AST 102* 86* 75* 64* 46*  ALT 87* 86* 81* 78* 64*  ALKPHOS 68 66 80 82 66  BILITOT 0.6 0.6 1.0 1.0 1.2  PROT 6.3* 6.2* 6.5 6.5 6.3*  ALBUMIN 2.8* 2.9* 3.0* 3.2* 3.1*   No results for input(s): LIPASE, AMYLASE in the last 168 hours. No results for input(s): AMMONIA in the last 168 hours. Coagulation Profile: Recent Labs  Lab 07/11/19 0030 07/12/19 0236 07/13/19 0340 07/14/19 0225 07/15/19 0410  INR 4.4* 4.1* 4.6* 5.0* 2.5*   Cardiac Enzymes: No results for input(s): CKTOTAL, CKMB, CKMBINDEX, TROPONINI in the last 168 hours. BNP (last 3 results) No results for input(s): PROBNP in the last 8760 hours. HbA1C: No results for input(s): HGBA1C in the last 72 hours. CBG: Recent Labs  Lab 07/14/19 1140 07/14/19 1713 07/14/19 2110 07/15/19 0758 07/15/19 1146  GLUCAP 176* 117* 201* 149* 154*   Lipid Profile: No results for input(s): CHOL, HDL, LDLCALC, TRIG, CHOLHDL, LDLDIRECT in the last 72 hours. Thyroid Function Tests: No results for input(s): TSH, T4TOTAL, FREET4, T3FREE, THYROIDAB in the last 72 hours. Anemia Panel: Recent Labs    07/14/19 0225 07/15/19 0410  FERRITIN 2,267* 1,933*   Urine analysis:    Component Value  Date/Time   COLORURINE YELLOW 07/15/2018 1402   APPEARANCEUR CLEAR 07/15/2018 1402   LABSPEC 1.013 07/15/2018 1402   PHURINE 5.0 07/15/2018 1402   GLUCOSEU NEGATIVE 07/15/2018 1402   HGBUR SMALL (A) 07/15/2018 1402   BILIRUBINUR NEGATIVE 07/15/2018 1402   BILIRUBINUR NEGATIVE 04/30/2016 1516   KETONESUR NEGATIVE 07/15/2018 1402   PROTEINUR NEGATIVE 07/15/2018 1402   UROBILINOGEN 4.0 04/30/2016 1516   NITRITE NEGATIVE 07/15/2018 1402   LEUKOCYTESUR NEGATIVE 07/15/2018 1402   Recent Results (from the past 240 hour(s))  Novel Coronavirus, NAA ([facility])     Status: Abnormal   Collection Time: 07/06/19 12:45 PM   Specimen: Oropharyngeal(OP) collection in vial transport medium  Result Value Ref Range Status   SARS-CoV-2, NAA Detected (A) Not Detected Final    Comment: This test was developed and its performance characteristics determined by [employer]. This test has not been FDA cleared or approved. This test has been authorized by FDA under an Emergency Use Authorization (EUA).  This test is only authorized for the duration of time the declaration that circumstances exist justifying the authorization of the emergency use of in vitro diagnostic tests for detection of SARS-CoV-2 virus and/or diagnosis of COVID-19 infection under section 564(b)(1) of the Act, 21 U.S.C. 536IWO-0(H)(2), unless the authorization is terminated or revoked sooner. When diagnostic testing is negative, the possibility of a false negative result should be considered in the context of a patient's recent exposures and the presence of clinical signs and symptoms consistent with COVID-19. An individual without symptoms of COVID-19 and who is not shedding SARS-CoV-2 virus would expect to have a negative (not detected) result in this assay.   Blood Culture (routine x 2)     Status: None   Collection Time: 07/08/19  2:56 PM   Specimen: Right Antecubital; Blood  Result Value Ref Range Status   Specimen  Description RIGHT ANTECUBITAL  Final   Special Requests   Final    BOTTLES DRAWN AEROBIC AND ANAEROBIC Blood Culture adequate volume   Culture   Final    NO GROWTH 5 DAYS Performed at [facility], [address], [place], [place] [address]    Report Status 07/13/2019 FINAL  Final  Blood Culture (routine x 2)     Status: None   Collection Time: 07/08/19  3:29 PM   Specimen: BLOOD RIGHT HAND  Result Value Ref Range Status   Specimen Description BLOOD RIGHT HAND  Final   Special Requests   Final    BOTTLES DRAWN AEROBIC AND ANAEROBIC Blood Culture adequate volume   Culture   Final    NO GROWTH 5 DAYS Performed at [facility], [address], [place], [place] [address]    Report Status 07/13/2019 FINAL  Final      Radiology Studies: No results found.  Scheduled Meds: . albuterol  2 puff Inhalation Q6H  . aspirin EC  81 mg Oral Daily  . atorvastatin  40 mg Oral QPM  . dexamethasone (DECADRON) injection  6 mg Intravenous Q12H  . fluticasone furoate-vilanterol  1 puff Inhalation Daily   And  . umeclidinium bromide  1 puff Inhalation Daily  . insulin aspart  0-15 Units Subcutaneous TID WC  . insulin aspart  0-5 Units Subcutaneous QHS  . insulin aspart  4 Units Subcutaneous TID WC  . mouth rinse  15 mL Mouth Rinse BID  . metoprolol succinate  50 mg Oral Daily  . sodium chloride flush  10-40 mL Intracatheter Q12H  . warfarin  4 mg Oral ONCE-1800  . Warfarin - Pharmacist Dosing Inpatient   Does not apply q1800   Continuous Infusions:   LOS: 7 days   Time spent: 35 minutes.  Patrecia Pour, MD [facility] [email or web address] Password [email or web address] 07/15/2019, 4:44 PM

## 2019-07-16 LAB — C-REACTIVE PROTEIN: CRP: <0.8 mg/dL (ref <1.0)

## 2019-07-16 LAB — CBC WITH DIFFERENTIAL/PLATELET
Abs Immature Granulocytes: 1.32 10*3/uL — ABNORMAL HIGH (ref 0.00–0.07)
Basophils Absolute: 0.1 10*3/uL (ref 0.0–0.1)
Basophils Relative: 1 %
Eosinophils Absolute: 0 10*3/uL (ref 0.0–0.5)
Eosinophils Relative: 0 %
HCT: 36.8 % — ABNORMAL LOW (ref 39.0–52.0)
Hemoglobin: 11.8 g/dL — ABNORMAL LOW (ref 13.0–17.0)
Immature Granulocytes: 7 %
Lymphocytes Relative: 3 %
Lymphs Abs: 0.5 10*3/uL — ABNORMAL LOW (ref 0.7–4.0)
MCH: 29.1 pg (ref 26.0–34.0)
MCHC: 32.1 g/dL (ref 30.0–36.0)
MCV: 90.9 fL (ref 80.0–100.0)
Monocytes Absolute: 0.3 10*3/uL (ref 0.1–1.0)
Monocytes Relative: 2 %
Neutro Abs: 17.3 10*3/uL — ABNORMAL HIGH (ref 1.7–7.7)
Neutrophils Relative %: 87 %
Platelets: 369 10*3/uL (ref 150–400)
RBC: 4.05 MIL/uL — ABNORMAL LOW (ref 4.22–5.81)
RDW: 14.6 % (ref 11.5–15.5)
WBC: 19.6 10*3/uL — ABNORMAL HIGH (ref 4.0–10.5)
nRBC: 0.1 % (ref 0.0–0.2)

## 2019-07-16 LAB — GLUCOSE, CAPILLARY
Glucose-Capillary: 129 mg/dL — ABNORMAL HIGH (ref 70–99)
Glucose-Capillary: 208 mg/dL — ABNORMAL HIGH (ref 70–99)
Glucose-Capillary: 104 mg/dL — ABNORMAL HIGH (ref 70–99)
Glucose-Capillary: 84 mg/dL (ref 70–99)

## 2019-07-16 LAB — D-DIMER, QUANTITATIVE: D-Dimer, Quant: 9.27 ug/mL-FEU — ABNORMAL HIGH (ref 0.00–0.50)

## 2019-07-16 LAB — COMPREHENSIVE METABOLIC PANEL
ALT: 56 U/L — ABNORMAL HIGH (ref 0–44)
AST: 41 U/L (ref 15–41)
Albumin: 3.1 g/dL — ABNORMAL LOW (ref 3.5–5.0)
Alkaline Phosphatase: 63 U/L (ref 38–126)
Anion gap: 13 (ref 5–15)
BUN: 42 mg/dL — ABNORMAL HIGH (ref 8–23)
CO2: 30 mmol/L (ref 22–32)
Calcium: 8.5 mg/dL — ABNORMAL LOW (ref 8.9–10.3)
Chloride: 94 mmol/L — ABNORMAL LOW (ref 98–111)
Creatinine, Ser: 1.01 mg/dL (ref 0.61–1.24)
GFR calc Af Amer: >60 mL/min (ref >60)
GFR calc non Af Amer: >60 mL/min (ref >60)
Glucose, Bld: 122 mg/dL — ABNORMAL HIGH (ref 70–99)
Potassium: 4.5 mmol/L (ref 3.5–5.1)
Sodium: 137 mmol/L (ref 135–145)
Total Bilirubin: 1.3 mg/dL — ABNORMAL HIGH (ref 0.3–1.2)
Total Protein: 6.1 g/dL — ABNORMAL LOW (ref 6.5–8.1)

## 2019-07-16 LAB — PROTIME-INR
INR: 2.6 — ABNORMAL HIGH (ref 0.8–1.2)
Prothrombin Time: 27.3 seconds — ABNORMAL HIGH (ref 11.4–15.2)

## 2019-07-16 LAB — FERRITIN: Ferritin: 1451 ng/mL — ABNORMAL HIGH (ref 24–336)

## 2019-07-16 MED ORDER — WARFARIN SODIUM 3 MG PO TABS
3.0000 mg | ORAL_TABLET | Freq: Once | ORAL | Status: AC
  Administered 2019-07-16: 3 mg via ORAL
  Filled 2019-07-16: qty 1

## 2019-07-16 MED ORDER — METHYLPREDNISOLONE SODIUM SUCC 125 MG IJ SOLR
60.0000 mg | Freq: Two times a day (BID) | INTRAMUSCULAR | Status: DC
  Administered 2019-07-16 – 2019-07-17 (×2): 60 mg via INTRAVENOUS
  Filled 2019-07-16 (×2): qty 2

## 2019-07-16 NOTE — Progress Notes (Signed)
ANTICOAGULATION CONSULT NOTE - Follow Up Consult  Pharmacy Consult for warfarin Indication: Afib, mechanical mitral valve replacement  Allergies  Allergen Reactions  . Ramipril Cough    cough  . Percocet [Oxycodone-Acetaminophen]     In ICU it contributed to hallucinations. Can take by itself    Patient Measurements: Height: 5\' 11"  (180.3 cm) Weight: 300 lb (136.1 kg) IBW/kg (Calculated) : 75.3   Vital Signs: Temp: 97.6 F (36.4 C) (08/20 0448) Temp Source: Oral (08/20 0448) BP: 119/65 (08/20 0448) Pulse Rate: 64 (08/20 0448)  Labs: Recent Labs    07/14/19 0225 07/15/19 0410 07/15/19 1855 07/16/19 0430  HGB 12.3* 12.1*  --  11.8*  HCT 37.9* 37.4*  --  36.8*  PLT 417* 377  --  369  LABPROT 45.4* 26.7* 22.9* 27.3*  INR 5.0* 2.5* 2.1* 2.6*  CREATININE 1.21 1.13  --  1.01    Estimated Creatinine Clearance: 106.8 mL/min (by C-G formula based on SCr of 1.01 mg/dL).   Medications:  Scheduled:  . albuterol  2 puff Inhalation Q6H  . aspirin EC  81 mg Oral Daily  . atorvastatin  40 mg Oral QPM  . dexamethasone (DECADRON) injection  6 mg Intravenous Q12H  . fluticasone furoate-vilanterol  1 puff Inhalation Daily   And  . umeclidinium bromide  1 puff Inhalation Daily  . insulin aspart  0-15 Units Subcutaneous TID WC  . insulin aspart  0-5 Units Subcutaneous QHS  . insulin aspart  4 Units Subcutaneous TID WC  . mouth rinse  15 mL Mouth Rinse BID  . metoprolol succinate  50 mg Oral Daily  . sodium chloride flush  10-40 mL Intracatheter Q12H  . Warfarin - Pharmacist Dosing Inpatient   Does not apply q1800   Infusions:    Assessment: 19 yoM admitted on 8/12 for COVID-19 pneumonia with PMH of Afib, mitral valve replacement with metallic valve, tricuspid valve repair, hypertension, COPD, CAD, hyperlipidemia, obesity, chronic diastolic CHF.  Pharmacy is consulted to continue warfarin dosing. PTA warfarin: 4mg  daily except 6 mg on Mon/Thursday.  Unknown last dose.   Admission INR 1.8  Currently on Warfarin / Lovenox bridge given INR drop to 2.1 yesterday after one dose of vit k. INR today has trended up to 2.6. H/H low stable, Plt wnl.   Drug interactions include decadron which can elevate INR   Goal of Therapy:  INR 2.5-3.5 Monitor platelets by anticoagulation protocol: Yes   Plan:  D/c Lovenox since INR therapeutic Warfarin 3 mg x 1 dose today given INR response after a single dose  Will recheck INR later this evening  Daily PT/INR. Monitor for signs and symptoms of bleeding.  Albertina Parr, PharmD., BCPS Clinical Pharmacist Clinical phone for 07/15/19 until 5pm: [phone]

## 2019-07-16 NOTE — Progress Notes (Signed)
Physical Therapy Treatment Patient Details Name: Jeffrey Larson MRN: [id number] DOB: May 23, 1956 Today's Date: 07/16/2019    History of Present Illness Jeffrey Larson is an 63 y.o. male past medical history : atrial fibrillation , metallic mitral valve replacement, tricuspid repair, essential hypertension ,obesity, chronic diastolic heart failure presents to the hospital 07/08/19 with shortness of breath, hypoxia,fever chills,Positive for covid-19 07/06/19,recovering at home,    PT Comments    The patient has finger probe on forehead reading 100% on 10 L. Decreased to 8 liters with 100% SPO@. . Placed nelcor forehead probe, placed on 8 Liters to ambulate 100' with SpO2 dropping into low *80%'.. Placed on 6 L with 77% so back to 8 with 85% while ambulating 100' 2 more times, resting between.continue to monitor SPO2 as effectively as possible during mobility to determine O2 needs.    Follow Up Recommendations  Home health PT     Equipment Recommendations  None recommended by PT    Recommendations for Other Services       Precautions / Restrictions Precautions Precaution Comments: INR can be high, trying to wean O2, nelcor with lower readu=ing then finger forehead probe by 10 pts, good pleth.    Mobility  Bed Mobility Overal bed mobility: Independent                Transfers Overall transfer level: Independent                  Ambulation/Gait Ambulation/Gait assistance: Supervision Gait Distance (Feet): 100 Feet(x 3) Assistive device: None Gait Pattern/deviations: WFL(Within Functional Limits) Gait velocity: decr   General Gait Details: patient ambulated on 8,then 6 with dips to 76%9Nelcor), placed back to 8 with 81%, then after recovery , remained 88%(Nelcor) and 100% finger on forehead probe.   Stairs             Wheelchair Mobility    Modified Rankin (Stroke Patients Only)       Balance                                             Cognition Arousal/Alertness: Awake/alert                                            Exercises      General Comments        Pertinent Vitals/Pain      Home Living                      Prior Function            PT Goals (current goals can now be found in the care plan section) Progress towards PT goals: Progressing toward goals    Frequency    Min 3X/week      PT Plan Current plan remains appropriate    Co-evaluation              AM-PAC PT "6 Clicks" Mobility   Outcome Measure  Help needed turning from your back to your side while in a flat bed without using bedrails?: None Help needed moving from lying on your back to sitting on the side of a flat bed without using bedrails?: None Help needed moving to and from a  bed to a chair (including a wheelchair)?: None Help needed standing up from a chair using your arms (e.g., wheelchair or bedside chair)?: None Help needed to walk in hospital room?: A Little Help needed climbing 3-5 steps with a railing? : A Little 6 Click Score: 22    End of Session Equipment Utilized During Treatment: Oxygen Activity Tolerance: Patient tolerated treatment well Patient left: in chair;with call bell/phone within reach Nurse Communication: Mobility status PT Visit Diagnosis: Difficulty in walking, not elsewhere classified (R26.2)     Time: 6837-2902 PT Time Calculation (min) (ACUTE ONLY): 26 min  Charges:  $Gait Training: 23-37 mins                     [place]   Office [phone]    Claretha Cooper 07/16/2019, 5:28 PM

## 2019-07-16 NOTE — Progress Notes (Signed)
PROGRESS NOTE  Jeffrey Larson  IWL:[id number] DOB: January 09, 1956 DOA: 07/08/2019 PCP: [name], DO  Brief Narrative: ANTHONNY Larson is a 63 y.o. male with a history of AFib s/p MAZE procedure and mechanical MVR, HTN, chronic HFpEF, OSA who presented with fever, chills and progressive shortness of breath in the setting of known covid-19 infection. His wife had been admitted at [facility] for covid pneumonia and he was subsequently admitted here as well. Remdesivir and decadron started on 8/12, tocilizumab was given 8/12.   Assessment & Plan: Active Problems:   Essential hypertension, benign   COPD with chronic bronchitis (HCC)   Acute respiratory failure with hypoxia (HCC)   Controlled type 2 diabetes mellitus without complication, without long-term current use of insulin (HCC)   Chronic combined systolic and diastolic congestive heart failure (HCC)   Persistent atrial fibrillation   S/P mitral valve replacement with metallic valve + tricuspid valve repair + maze procedure   2019 novel coronavirus disease (COVID-19)   COVID-19 virus infection  Acute hypoxemic respiratory failure due to covid-19 pneumonia: Significant risk of progressive failure/ARDS.  - Continue steroids, d/w pharmacy, will switch to ~1mg /kg/d of methylprednisolone.  - Completed remdesivir and was given tocilizumab 8/12.  - Wean oxygen as able.  - Trend inflammatory markers which are showing signs of improvement. CRP normalized 8/18, d-dimer continues to trend downward. - Prone as much as reasonable. - PT, ambulate regularly  AKI: Improved with holding diuretic therapy.  - Continue to hold nephrotoxins and diuretics  History of atrial fibrillation s/p MAZE, mechanical MVR:  - Continue home medications  Supratherapeutic INR: Improving with holding coumadin and gave low dose vitamin K.  - Recheck INR daily, aim to maintain INR 2.5 - 3.5. Decadron suspected to be raising INR. Solumedrol possibly less offensive in  this regard, so will switch today. If INR falls <2.5, will bridge with lovenox.  Chronic combined HFrEF:  - Continue metoprolol, holding diuretics, entresto.   Controlled T2DM:  - Continue insulin as ordered.   COPD:  - Steroids as above, augmented. continue scheduled inhalers, albuterol q6h and prn.  LFT elevation: Mild, likely due to viral infection. Improving. - Continue to monitor.   DVT prophylaxis: Coumadin, lovenox bridging when INR <2.5.  Code Status: Full Family Communication: None at bedside Disposition Plan: Pending improvement in hypoxia.   Consultants:   PCCM  Procedures:   None  Antimicrobials:  Remdesivir 8/12-8/16  Subjective: Dyspnea improved this morning, now mild at rest, worse with exertion, not associated with chest pain. Feeling more energy, sitting in chair today, wants to get up more today. Confirms his wife is recovering well at home at being discharged from this hospital, however a friend from church was admitted here with covid yesterday.   Objective: Vitals:   07/15/19 1635 07/15/19 1948 07/15/19 2357 07/16/19 0448  BP: 115/75 110/69 (!) 149/72 119/65  Pulse: (!) 102   64  Resp: (!) 22 16  18   Temp: (!) 97.4 F (36.3 C) 97.6 F (36.4 C) 98.1 F (36.7 C) 97.6 F (36.4 C)  TempSrc: Oral Oral Oral Oral  SpO2: 99% 100%  92%  Weight:      Height:        Intake/Output Summary (Last 24 hours) at 07/16/2019 1041 Last data filed at 07/16/2019 0300 Gross per 24 hour  Intake 1080 ml  Output 2125 ml  Net -1045 ml   Filed Weights   07/08/19 1443  Weight: 136.1 kg   Gen: Obese, pleasant  male in no distress Pulm: Nonlabored breathing supplemental oxygen by HFNC. Inspiratory crackles diffusely with improved/decreased expiratory rhonchi/wheezes. CV: Regular rate and rhythm. Mechanical S1 with II/VI systolic murmur, no gallop. No JVD, no dependent edema. GI: Abdomen soft, non-tender, non-distended, with normoactive bowel sounds.  Ext: Warm, no  deformities Skin: No rashes, lesions or ulcers on visualized skin. Neuro: Alert and oriented. No focal neurological deficits. Psych: Judgement and insight appear fair. Mood euthymic & affect congruent. Behavior is appropriate.    Data Reviewed: I have personally reviewed following labs and imaging studies  CBC: Recent Labs  Lab 07/12/19 0236 07/13/19 0340 07/14/19 0225 07/15/19 0410 07/16/19 0430  WBC 13.1* 18.4* 19.3* 21.0* 19.6*  NEUTROABS 11.2* 16.0* 16.9* 18.2* 17.3*  HGB 11.6* 12.1* 12.3* 12.1* 11.8*  HCT 36.2* 37.5* 37.9* 37.4* 36.8*  MCV 91.0 91.0 89.4 89.5 90.9  PLT 372 373 417* 377 559   Basic Metabolic Panel: Recent Labs  Lab 07/10/19 0243 07/11/19 0030 07/12/19 0236 07/13/19 0340 07/14/19 0225 07/15/19 0410 07/16/19 0430  NA 133* 135 136 137 135 136 137  K 4.3 4.6 5.1 5.2* 4.4 4.6 4.5  CL 97* 98 95* 98 89* 92* 94*  CO2 27 26 30 27 31  33* 30  GLUCOSE 126* 150* 159* 140* 154* 159* 122*  BUN 36* 31* 44* 38* 48* 47* 42*  CREATININE 0.94 0.96 1.17 1.00 1.21 1.13 1.01  CALCIUM 8.2* 8.4* 8.6* 8.7* 8.2* 8.5* 8.5*  MG 2.6* 2.3 2.0 2.2  --   --   --   PHOS 2.5 3.1 4.3 4.7*  --   --   --    GFR: Estimated Creatinine Clearance: 106.8 mL/min (by C-G formula based on SCr of 1.01 mg/dL). Liver Function Tests: Recent Labs  Lab 07/12/19 0236 07/13/19 0340 07/14/19 0225 07/15/19 0410 07/16/19 0430  AST 86* 75* 64* 46* 41  ALT 86* 81* 78* 64* 56*  ALKPHOS 66 80 82 66 63  BILITOT 0.6 1.0 1.0 1.2 1.3*  PROT 6.2* 6.5 6.5 6.3* 6.1*  ALBUMIN 2.9* 3.0* 3.2* 3.1* 3.1*   No results for input(s): LIPASE, AMYLASE in the last 168 hours. No results for input(s): AMMONIA in the last 168 hours. Coagulation Profile: Recent Labs  Lab 07/13/19 0340 07/14/19 0225 07/15/19 0410 07/15/19 1855 07/16/19 0430  INR 4.6* 5.0* 2.5* 2.1* 2.6*   Cardiac Enzymes: No results for input(s): CKTOTAL, CKMB, CKMBINDEX, TROPONINI in the last 168 hours. BNP (last 3 results) No results  for input(s): PROBNP in the last 8760 hours. HbA1C: No results for input(s): HGBA1C in the last 72 hours. CBG: Recent Labs  Lab 07/15/19 0758 07/15/19 1146 07/15/19 1633 07/15/19 2048 07/16/19 0729  GLUCAP 149* 154* 130* 178* 129*   Lipid Profile: No results for input(s): CHOL, HDL, LDLCALC, TRIG, CHOLHDL, LDLDIRECT in the last 72 hours. Thyroid Function Tests: No results for input(s): TSH, T4TOTAL, FREET4, T3FREE, THYROIDAB in the last 72 hours. Anemia Panel: Recent Labs    07/15/19 0410 07/16/19 0430  FERRITIN 1,933* 1,451*   Urine analysis:    Component Value Date/Time   COLORURINE YELLOW 07/15/2018 1402   APPEARANCEUR CLEAR 07/15/2018 1402   LABSPEC 1.013 07/15/2018 1402   PHURINE 5.0 07/15/2018 1402   GLUCOSEU NEGATIVE 07/15/2018 1402   HGBUR SMALL (A) 07/15/2018 1402   BILIRUBINUR NEGATIVE 07/15/2018 [place] 04/30/2016 1516   KETONESUR NEGATIVE 07/15/2018 1402   PROTEINUR NEGATIVE 07/15/2018 1402   UROBILINOGEN 4.0 04/30/2016 1516   NITRITE NEGATIVE 07/15/2018 1402  LEUKOCYTESUR NEGATIVE 07/15/2018 1402   Recent Results (from the past 240 hour(s))  Novel Coronavirus, NAA ([facility])     Status: Abnormal   Collection Time: 07/06/19 12:45 PM   Specimen: Oropharyngeal(OP) collection in vial transport medium  Result Value Ref Range Status   SARS-CoV-2, NAA Detected (A) Not Detected Final    Comment: This test was developed and its performance characteristics determined by [employer]. This test has not been FDA cleared or approved. This test has been authorized by FDA under an Emergency Use Authorization (EUA). This test is only authorized for the duration of time the declaration that circumstances exist justifying the authorization of the emergency use of in vitro diagnostic tests for detection of SARS-CoV-2 virus and/or diagnosis of COVID-19 infection under section 564(b)(1) of the Act, 21 U.S.C. 782UMP-5(T)(6), unless the  authorization is terminated or revoked sooner. When diagnostic testing is negative, the possibility of a false negative result should be considered in the context of a patient's recent exposures and the presence of clinical signs and symptoms consistent with COVID-19. An individual without symptoms of COVID-19 and who is not shedding SARS-CoV-2 virus would expect to have a negative (not detected) result in this assay.   Blood Culture (routine x 2)     Status: None   Collection Time: 07/08/19  2:56 PM   Specimen: Right Antecubital; Blood  Result Value Ref Range Status   Specimen Description RIGHT ANTECUBITAL  Final   Special Requests   Final    BOTTLES DRAWN AEROBIC AND ANAEROBIC Blood Culture adequate volume   Culture   Final    NO GROWTH 5 DAYS Performed at [facility], [address] [place], [place] [address]    Report Status 07/13/2019 FINAL  Final  Blood Culture (routine x 2)     Status: None   Collection Time: 07/08/19  3:29 PM   Specimen: BLOOD RIGHT HAND  Result Value Ref Range Status   Specimen Description BLOOD RIGHT HAND  Final   Special Requests   Final    BOTTLES DRAWN AEROBIC AND ANAEROBIC Blood Culture adequate volume   Culture   Final    NO GROWTH 5 DAYS Performed at [facility], [address], [place], [place] [address]    Report Status 07/13/2019 FINAL  Final      Radiology Studies: No results found.  Scheduled Meds: . albuterol  2 puff Inhalation Q6H  . aspirin EC  81 mg Oral Daily  . atorvastatin  40 mg Oral QPM  . fluticasone furoate-vilanterol  1 puff Inhalation Daily   And  . umeclidinium bromide  1 puff Inhalation Daily  . insulin aspart  0-15 Units Subcutaneous TID WC  . insulin aspart  0-5 Units Subcutaneous QHS  . insulin aspart  4 Units Subcutaneous TID WC  . mouth rinse  15 mL Mouth Rinse BID  . methylPREDNISolone (SOLU-MEDROL) injection  60 mg Intravenous Q12H  . metoprolol succinate  50 mg Oral Daily  . sodium chloride flush  10-40 mL  Intracatheter Q12H  . warfarin  3 mg Oral ONCE-1800  . Warfarin - Pharmacist Dosing Inpatient   Does not apply q1800   Continuous Infusions:   LOS: 8 days   Time spent: 35 minutes.  Patrecia Pour, MD [facility] [email or web address] Password [email or web address] 07/16/2019, 10:41 AM

## 2019-07-17 ENCOUNTER — Encounter (HOSPITAL_COMMUNITY): Payer: BC Managed Care – PPO

## 2019-07-17 LAB — D-DIMER, QUANTITATIVE: D-Dimer, Quant: 7.79 ug/mL-FEU — ABNORMAL HIGH (ref 0.00–0.50)

## 2019-07-17 LAB — COMPREHENSIVE METABOLIC PANEL
ALT: 52 U/L — ABNORMAL HIGH (ref 0–44)
AST: 41 U/L (ref 15–41)
Albumin: 2.9 g/dL — ABNORMAL LOW (ref 3.5–5.0)
Alkaline Phosphatase: 58 U/L (ref 38–126)
Anion gap: 9 (ref 5–15)
BUN: 35 mg/dL — ABNORMAL HIGH (ref 8–23)
CO2: 30 mmol/L (ref 22–32)
Calcium: 8.3 mg/dL — ABNORMAL LOW (ref 8.9–10.3)
Chloride: 97 mmol/L — ABNORMAL LOW (ref 98–111)
Creatinine, Ser: 0.94 mg/dL (ref 0.61–1.24)
GFR calc Af Amer: >60 mL/min (ref >60)
GFR calc non Af Amer: >60 mL/min (ref >60)
Glucose, Bld: 159 mg/dL — ABNORMAL HIGH (ref 70–99)
Potassium: 5.2 mmol/L — ABNORMAL HIGH (ref 3.5–5.1)
Sodium: 136 mmol/L (ref 135–145)
Total Bilirubin: 0.9 mg/dL (ref 0.3–1.2)
Total Protein: 5.6 g/dL — ABNORMAL LOW (ref 6.5–8.1)

## 2019-07-17 LAB — BASIC METABOLIC PANEL
Anion gap: 10 (ref 5–15)
BUN: 34 mg/dL — ABNORMAL HIGH (ref 8–23)
CO2: 30 mmol/L (ref 22–32)
Calcium: 8.7 mg/dL — ABNORMAL LOW (ref 8.9–10.3)
Chloride: 97 mmol/L — ABNORMAL LOW (ref 98–111)
Creatinine, Ser: 0.91 mg/dL (ref 0.61–1.24)
GFR calc Af Amer: >60 mL/min (ref >60)
GFR calc non Af Amer: >60 mL/min (ref >60)
Glucose, Bld: 119 mg/dL — ABNORMAL HIGH (ref 70–99)
Potassium: 5.2 mmol/L — ABNORMAL HIGH (ref 3.5–5.1)
Sodium: 137 mmol/L (ref 135–145)

## 2019-07-17 LAB — GLUCOSE, CAPILLARY
Glucose-Capillary: 136 mg/dL — ABNORMAL HIGH (ref 70–99)
Glucose-Capillary: 148 mg/dL — ABNORMAL HIGH (ref 70–99)
Glucose-Capillary: 107 mg/dL — ABNORMAL HIGH (ref 70–99)
Glucose-Capillary: 114 mg/dL — ABNORMAL HIGH (ref 70–99)

## 2019-07-17 LAB — FERRITIN: Ferritin: 1032 ng/mL — ABNORMAL HIGH (ref 24–336)

## 2019-07-17 LAB — PROTIME-INR
INR: 3 — ABNORMAL HIGH (ref 0.8–1.2)
Prothrombin Time: 30.6 seconds — ABNORMAL HIGH (ref 11.4–15.2)

## 2019-07-17 LAB — MAGNESIUM: Magnesium: 2.4 mg/dL (ref 1.7–2.4)

## 2019-07-17 MED ORDER — METHYLPREDNISOLONE SODIUM SUCC 125 MG IJ SOLR
60.0000 mg | Freq: Three times a day (TID) | INTRAMUSCULAR | Status: DC
  Administered 2019-07-17 – 2019-07-18 (×4): 60 mg via INTRAVENOUS
  Filled 2019-07-17 (×4): qty 2

## 2019-07-17 MED ORDER — SODIUM ZIRCONIUM CYCLOSILICATE 5 G PO PACK
5.0000 g | PACK | Freq: Every day | ORAL | Status: AC
  Administered 2019-07-17: 5 g via ORAL
  Filled 2019-07-17: qty 1

## 2019-07-17 NOTE — Progress Notes (Addendum)
PROGRESS NOTE  Jeffrey Larson  [id number] DOB: [birth year] DOA: 07/08/2019 PCP: [name], DO  Brief Narrative: Jeffrey Larson is a 63 y.o. male with a history of AFib s/p MAZE procedure and mechanical MVR, HTN, chronic HFpEF, OSA who presented with fever, chills and progressive shortness of breath in the setting of known covid-19 infection. His wife had been admitted at [facility] for covid pneumonia and he was subsequently admitted here as well. Remdesivir and decadron started on 8/12, tocilizumab was given 8/12.   Assessment & Plan: Active Problems:   Essential hypertension, benign   COPD with chronic bronchitis (HCC)   Acute respiratory failure with hypoxia (HCC)   Controlled type 2 diabetes mellitus without complication, without long-term current use of insulin (HCC)   Chronic combined systolic and diastolic congestive heart failure (HCC)   Persistent atrial fibrillation   S/P mitral valve replacement with metallic valve + tricuspid valve repair + maze procedure   2019 novel coronavirus disease (COVID-19)   COVID-19 virus infection  Acute hypoxemic respiratory failure due to covid-19 pneumonia: Significant risk of progressive failure/ARDS.  - Continue steroids, d/w pharmacy and switched to methylprednisolone. Given ongoing wheezing, will increase dose to 60mg  IV q8h..  - Completed remdesivir and was given tocilizumab 8/12.  - Wean oxygen as able.  - Trend inflammatory markers which are showing signs of improvement. CRP normalized 8/18, d-dimer continues to trend downward. - Prone as much as reasonable. - PT, ambulate regularly  AKI: Improved with holding diuretic therapy.  - Continue to hold nephrotoxins and diuretics  Hyperkalemia: Mild. No mention of hemolysis.  - Low dose lokelma today, recheck in AM. Continuing telemetry monitoring.  History of atrial fibrillation s/p MAZE, mechanical MVR:  - Continue home medications  Supratherapeutic INR: Improving with holding  coumadin and gave low dose vitamin K.  - Recheck INR daily, aim to maintain INR 2.5 - 3.5. Repeat coumadin 3mg  tonight, appreciate pharmacy assistance.  Chronic combined HFrEF:  - Continue metoprolol, holding diuretics, entresto.   Controlled T2DM:  - Continue insulin as ordered.   COPD:  - Steroids as above, augmented again today.  - Continue scheduled inhalers, albuterol q6h and prn.  LFT elevation: Mild, likely due to viral infection. Improving. - Continue to monitor.   DVT prophylaxis: Coumadin, lovenox bridging when INR <2.5.  Code Status: Full Family Communication: None at bedside Disposition Plan: Pending improvement in hypoxia. Declines HH.  Consultants:   PCCM  Procedures:   None  Antimicrobials:  Remdesivir 8/12-8/16  Subjective: "I feel great." Worked with PT yesterday walking several hundred feet. Did have some shortness of breath but much less than previously and able to do more than previous attempts. Hypoxic requiring 6L O2 on ambulation. Wants to go home ASAP.    Objective: Vitals:   07/17/19 0600 07/17/19 0700 07/17/19 0755 07/17/19 0800  BP:   (!) 113/45 (!) 100/56  Pulse: 64 67  67  Resp: 19 14  18   Temp:   97.9 F (36.6 C)   TempSrc:   Oral   SpO2: 95% 97% 95% 93%  Weight:      Height:        Intake/Output Summary (Last 24 hours) at 07/17/2019 1055 Last data filed at 07/17/2019 1000 Gross per 24 hour  Intake 500 ml  Output 1125 ml  Net -625 ml   Filed Weights   07/08/19 1443  Weight: 136.1 kg   Gen: Pleasant male in no distress sitting in bedside chair. Pulm: Nonlabored  breathing 4LPM O2, crackles resolving, expiratory wheezes/rhonchi remain diffusely. CV: Regular rate and rhythm. No murmur, rub, or gallop. No JVD, no dependent edema. GI: Abdomen soft, non-tender, non-distended, with normoactive bowel sounds.  Ext: Warm, no deformities Skin: No rashes, lesions or ulcers on visualized skin. Neuro: Alert and oriented. No focal  neurological deficits. Psych: Judgement and insight appear fair. Mood euthymic & affect congruent. Behavior is appropriate.    Data Reviewed: I have personally reviewed following labs and imaging studies  CBC: Recent Labs  Lab 07/12/19 0236 07/13/19 0340 07/14/19 0225 07/15/19 0410 07/16/19 0430  WBC 13.1* 18.4* 19.3* 21.0* 19.6*  NEUTROABS 11.2* 16.0* 16.9* 18.2* 17.3*  HGB 11.6* 12.1* 12.3* 12.1* 11.8*  HCT 36.2* 37.5* 37.9* 37.4* 36.8*  MCV 91.0 91.0 89.4 89.5 90.9  PLT 372 373 417* 377 0000000   Basic Metabolic Panel: Recent Labs  Lab 07/11/19 0030 07/12/19 0236 07/13/19 0340 07/14/19 0225 07/15/19 0410 07/16/19 0430 07/17/19 0458  NA 135 136 137 135 136 137 136  K 4.6 5.1 5.2* 4.4 4.6 4.5 5.2*  CL 98 95* 98 89* 92* 94* 97*  CO2 26 30 27 31  33* 30 30  GLUCOSE 150* 159* 140* 154* 159* 122* 159*  BUN 31* 44* 38* 48* 47* 42* 35*  CREATININE 0.96 1.17 1.00 1.21 1.13 1.01 0.94  CALCIUM 8.4* 8.6* 8.7* 8.2* 8.5* 8.5* 8.3*  MG 2.3 2.0 2.2  --   --   --   --   PHOS 3.1 4.3 4.7*  --   --   --   --    GFR: Estimated Creatinine Clearance: 114.8 mL/min (by C-G formula based on SCr of 0.94 mg/dL). Liver Function Tests: Recent Labs  Lab 07/13/19 0340 07/14/19 0225 07/15/19 0410 07/16/19 0430 07/17/19 0458  AST 75* 64* 46* 41 41  ALT 81* 78* 64* 56* 52*  ALKPHOS 80 82 66 63 58  BILITOT 1.0 1.0 1.2 1.3* 0.9  PROT 6.5 6.5 6.3* 6.1* 5.6*  ALBUMIN 3.0* 3.2* 3.1* 3.1* 2.9*   No results for input(s): LIPASE, AMYLASE in the last 168 hours. No results for input(s): AMMONIA in the last 168 hours. Coagulation Profile: Recent Labs  Lab 07/14/19 0225 07/15/19 0410 07/15/19 1855 07/16/19 0430 07/17/19 0458  INR 5.0* 2.5* 2.1* 2.6* 3.0*   Cardiac Enzymes: No results for input(s): CKTOTAL, CKMB, CKMBINDEX, TROPONINI in the last 168 hours. BNP (last 3 results) No results for input(s): PROBNP in the last 8760 hours. HbA1C: No results for input(s): HGBA1C in the last 72  hours. CBG: Recent Labs  Lab 07/16/19 0729 07/16/19 1138 07/16/19 1703 07/16/19 2123 07/17/19 0754  GLUCAP 129* 208* 104* 84 136*   Lipid Profile: No results for input(s): CHOL, HDL, LDLCALC, TRIG, CHOLHDL, LDLDIRECT in the last 72 hours. Thyroid Function Tests: No results for input(s): TSH, T4TOTAL, FREET4, T3FREE, THYROIDAB in the last 72 hours. Anemia Panel: Recent Labs    07/16/19 0430 07/17/19 0458  FERRITIN 1,451* 1,032*   Urine analysis:    Component Value Date/Time   COLORURINE YELLOW 07/15/2018 [place] 07/15/2018 1402   LABSPEC 1.013 07/15/2018 1402   PHURINE 5.0 07/15/2018 1402   GLUCOSEU NEGATIVE 07/15/2018 1402   HGBUR SMALL (A) 07/15/2018 1402   BILIRUBINUR NEGATIVE 07/15/2018 [place] 04/30/2016 1516   KETONESUR NEGATIVE 07/15/2018 1402   PROTEINUR NEGATIVE 07/15/2018 1402   UROBILINOGEN 4.0 04/30/2016 1516   NITRITE NEGATIVE 07/15/2018 1402   LEUKOCYTESUR NEGATIVE 07/15/2018 1402  Recent Results (from the past 240 hour(s))  Blood Culture (routine x 2)     Status: None   Collection Time: 07/08/19  2:56 PM   Specimen: Right Antecubital; Blood  Result Value Ref Range Status   Specimen Description RIGHT ANTECUBITAL  Final   Special Requests   Final    BOTTLES DRAWN AEROBIC AND ANAEROBIC Blood Culture adequate volume   Culture   Final    NO GROWTH 5 DAYS Performed at [facility], [address], [place], [place] [address]    Report Status 07/13/2019 FINAL  Final  Blood Culture (routine x 2)     Status: None   Collection Time: 07/08/19  3:29 PM   Specimen: BLOOD RIGHT HAND  Result Value Ref Range Status   Specimen Description BLOOD RIGHT HAND  Final   Special Requests   Final    BOTTLES DRAWN AEROBIC AND ANAEROBIC Blood Culture adequate volume   Culture   Final    NO GROWTH 5 DAYS Performed at [facility], [address] [place], [place] [address]    Report Status 07/13/2019 FINAL  Final      Radiology  Studies: No results found.  Scheduled Meds: . albuterol  2 puff Inhalation Q6H  . aspirin EC  81 mg Oral Daily  . atorvastatin  40 mg Oral QPM  . fluticasone furoate-vilanterol  1 puff Inhalation Daily   And  . umeclidinium bromide  1 puff Inhalation Daily  . insulin aspart  0-15 Units Subcutaneous TID WC  . insulin aspart  0-5 Units Subcutaneous QHS  . insulin aspart  4 Units Subcutaneous TID WC  . mouth rinse  15 mL Mouth Rinse BID  . methylPREDNISolone (SOLU-MEDROL) injection  60 mg Intravenous Q12H  . metoprolol succinate  50 mg Oral Daily  . sodium chloride flush  10-40 mL Intracatheter Q12H  . Warfarin - Pharmacist Dosing Inpatient   Does not apply q1800   Continuous Infusions:   LOS: 9 days   Time spent: 35 minutes.  Patrecia Pour, MD [facility] [email or web address] Password [facility] 07/17/2019, 10:55 AM

## 2019-07-17 NOTE — Progress Notes (Signed)
ANTICOAGULATION CONSULT NOTE - Follow Up Consult  Pharmacy Consult for warfarin Indication: Afib, mechanical mitral valve replacement  Allergies  Allergen Reactions  . Ramipril Cough    cough  . Percocet [Oxycodone-Acetaminophen]     In ICU it contributed to hallucinations. Can take by itself    Patient Measurements: Height: 5\' 11"  (180.3 cm) Weight: 300 lb (136.1 kg) IBW/kg (Calculated) : 75.3   Vital Signs: Temp: 97.9 F (36.6 C) (08/21 0755) Temp Source: Oral (08/21 0755) BP: 100/56 (08/21 0800) Pulse Rate: 67 (08/21 0800)  Labs: Recent Labs    07/15/19 0410 07/15/19 1855 07/16/19 0430 07/17/19 0458  HGB 12.1*  --  11.8*  --   HCT 37.4*  --  36.8*  --   PLT 377  --  369  --   LABPROT 26.7* 22.9* 27.3* 30.6*  INR 2.5* 2.1* 2.6* 3.0*  CREATININE 1.13  --  1.01 0.94    Estimated Creatinine Clearance: 114.8 mL/min (by C-G formula based on SCr of 0.94 mg/dL).   Medications:  Scheduled:  . albuterol  2 puff Inhalation Q6H  . aspirin EC  81 mg Oral Daily  . atorvastatin  40 mg Oral QPM  . fluticasone furoate-vilanterol  1 puff Inhalation Daily   And  . umeclidinium bromide  1 puff Inhalation Daily  . insulin aspart  0-15 Units Subcutaneous TID WC  . insulin aspart  0-5 Units Subcutaneous QHS  . insulin aspart  4 Units Subcutaneous TID WC  . mouth rinse  15 mL Mouth Rinse BID  . methylPREDNISolone (SOLU-MEDROL) injection  60 mg Intravenous Q12H  . metoprolol succinate  50 mg Oral Daily  . sodium chloride flush  10-40 mL Intracatheter Q12H  . Warfarin - Pharmacist Dosing Inpatient   Does not apply q1800   Infusions:    Assessment: 28 yoM admitted on 8/12 for COVID-19 pneumonia with PMH of Afib, mitral valve replacement with metallic valve, tricuspid valve repair, hypertension, COPD, CAD, hyperlipidemia, obesity, chronic diastolic CHF.  Pharmacy is consulted to continue warfarin dosing. PTA warfarin: 4mg  daily except 6 mg on Mon/Thursday.  Unknown last dose.   Admission INR 1.8  INR remains therapeutic at 3 today.   Decadron was switched to methylprednisolone to decrease interaction potential with warfarin   Goal of Therapy:  INR 2.5-3.5 Monitor platelets by anticoagulation protocol: Yes   Plan:  Repeat warfarin 3 mg x 1 dose today Daily PT/INR. Monitor for signs and symptoms of bleeding.  Albertina Parr, PharmD., BCPS Clinical Pharmacist Clinical phone for 07/17/19 until 5pm: [phone]

## 2019-07-17 NOTE — Plan of Care (Signed)
  Problem: Coping: Goal: Psychosocial and spiritual needs will be supported Outcome: Progressing   Problem: Respiratory: Goal: Will maintain a patent airway Outcome: Progressing   Problem: Activity: Goal: Risk for activity intolerance will decrease Outcome: Progressing   Problem: Safety: Goal: Ability to remain free from injury will improve Outcome: Progressing

## 2019-07-17 NOTE — Progress Notes (Signed)
Patient in room on the phone with son Theadore Nan). Updated son on patient condition. Son to call wife and daughter

## 2019-07-17 NOTE — Progress Notes (Addendum)
Pt. Had 7 beats of V-tach w/HR increase to 150s and then reverted back to normal sinus. V/s are:  134/70, 97% 70, 19 and asymptomatic.  MD notified.

## 2019-07-17 NOTE — TOC Transition Note (Signed)
Transition of Care [facility]) - CM/SW Discharge Note   Patient Details  Name: Jeffrey Larson MRN: [id number] Date of Birth: [birth year]  [facility] of [facility] CM/SW Contact:  Ninfa Meeker, RN Phone Number: [phone] (working remotely) 07/17/2019, 9:56 AM   Clinical Narrative:   63 yr old gentleman being treated for COVID 19. Thankfully patient is improving and will discharge home soon.  Case manager spoke with patient via telephone to discuss need for Home Health. Patient says that he lives home with wife and he is independent, will have his grandsons and family around if needed. He is politely declining [place] therapy.  No DME needed, no further Case manager needs identified.     Final next level of care: Home/Self Care Barriers to Discharge: No Barriers Identified   Patient Goals and CMS Choice     Choice offered to / list presented to : Patient  Discharge Placement                       Discharge Plan and Services   Discharge Planning Services: CM Consult Post Acute Care Choice: Home Health          DME Arranged: N/A         HH Arranged: Patient Refused Portal          Social Determinants of Health ([place]) Interventions     Readmission Risk Interventions Readmission Risk Prevention Plan 07/31/2018  Transportation Screening Complete  PCP or Specialist Appt within 5-7 Days Complete  Home Care Screening Complete  Medication Review (RN CM) Complete  Some recent data might be hidden

## 2019-07-18 LAB — FERRITIN: Ferritin: 823 ng/mL — ABNORMAL HIGH (ref 24–336)

## 2019-07-18 LAB — COMPREHENSIVE METABOLIC PANEL
ALT: 46 U/L — ABNORMAL HIGH (ref 0–44)
AST: 36 U/L (ref 15–41)
Albumin: 3 g/dL — ABNORMAL LOW (ref 3.5–5.0)
Alkaline Phosphatase: 55 U/L (ref 38–126)
Anion gap: 11 (ref 5–15)
BUN: 32 mg/dL — ABNORMAL HIGH (ref 8–23)
CO2: 26 mmol/L (ref 22–32)
Calcium: 8.5 mg/dL — ABNORMAL LOW (ref 8.9–10.3)
Chloride: 98 mmol/L (ref 98–111)
Creatinine, Ser: 0.88 mg/dL (ref 0.61–1.24)
GFR calc Af Amer: >60 mL/min (ref >60)
GFR calc non Af Amer: >60 mL/min (ref >60)
Glucose, Bld: 131 mg/dL — ABNORMAL HIGH (ref 70–99)
Potassium: 5.3 mmol/L — ABNORMAL HIGH (ref 3.5–5.1)
Sodium: 135 mmol/L (ref 135–145)
Total Bilirubin: 0.8 mg/dL (ref 0.3–1.2)
Total Protein: 5.5 g/dL — ABNORMAL LOW (ref 6.5–8.1)

## 2019-07-18 LAB — PROTIME-INR
INR: 2.9 — ABNORMAL HIGH (ref 0.8–1.2)
Prothrombin Time: 30.2 seconds — ABNORMAL HIGH (ref 11.4–15.2)

## 2019-07-18 LAB — D-DIMER, QUANTITATIVE: D-Dimer, Quant: 3.4 ug/mL-FEU — ABNORMAL HIGH (ref 0.00–0.50)

## 2019-07-18 LAB — GLUCOSE, CAPILLARY
Glucose-Capillary: 136 mg/dL — ABNORMAL HIGH (ref 70–99)
Glucose-Capillary: 98 mg/dL (ref 70–99)

## 2019-07-18 MED ORDER — DEXAMETHASONE 6 MG PO TABS: 6.0000 mg | ORAL_TABLET | Freq: Every day | ORAL | 0 refills | Status: AC

## 2019-07-18 MED ORDER — WARFARIN SODIUM 4 MG PO TABS
4.0000 mg | ORAL_TABLET | Freq: Once | ORAL | Status: AC
  Administered 2019-07-18: 4 mg via ORAL
  Filled 2019-07-18: qty 1

## 2019-07-18 NOTE — Discharge Summary (Signed)
Physician Discharge Summary  Jeffrey Larson [id number] DOB: 08-Sep-1956 DOA: 07/08/2019  PCP: [name], DO  Admit date: 07/08/2019 Discharge date: 07/18/2019  Admitted From: Home Disposition: Home   Recommendations for Outpatient Follow-up:  1. Follow up with PCP in 1-2 weeks 2. Please obtain CMP/CBC at follow up  Home Health: Politely declines Equipment/Devices: 3L O2 Discharge Condition: Stable, improved CODE STATUS: Full Diet recommendation: Heart healthy, carb-modified  Brief/Interim Summary: Jeffrey Larson is a 63 y.o. male with a history of AFib s/p MAZE procedure and mechanical MVR, HTN, chronic HFpEF, OSA who presented with fever, chills and progressive shortness of breath in the setting of known covid-19 infection. His wife had been admitted at [facility] for covid pneumonia and he was subsequently admitted here as well. Remdesivir and decadron started on 8/12, tocilizumab was given 8/12. A full 10-day course of steroids was completed with slow improvement in hypoxia. The patient has reached maximal benefit from inpatient therapy and is stable for discharge to home with supplemental oxygen.  Discharge Diagnoses:  Active Problems:   Essential hypertension, benign   COPD with chronic bronchitis (HCC)   Acute respiratory failure with hypoxia (HCC)   Controlled type 2 diabetes mellitus without complication, without long-term current use of insulin (HCC)   Chronic combined systolic and diastolic congestive heart failure (HCC)   Persistent atrial fibrillation   S/P mitral valve replacement with metallic valve + tricuspid valve repair + maze procedure   2019 novel coronavirus disease (COVID-19)   COVID-19 virus infection  Acute hypoxemic respiratory failure due to covid-19 pneumonia: Significant risk of progressive failure/ARDS.  - Taper steroids, having received 10 days.   - Completed remdesivir and was given tocilizumab 8/12.  - Continue supplemental oxygen with  exertion until follow up. - Inflammatory markers have shown significant and sustained improvement.   AKI: Improved - Ok to restart home medications.  Hyperkalemia: Mild in the setting of holding loop diuretic.  - Will advise to restart torsemide and hold spironolactone until repeat lab can be obtained. There have been no telemetry events.  History of atrial fibrillation s/p MAZE, mechanical MVR:  - Continue home medications  Supratherapeutic INR: Resolved with holding coumadin and gave low dose vitamin K.  - INR has maintained between 2.5 - 3.5 x3 days. Restart home dose and follow up as per routine.  Chronic combined HFrEF:  - Continue metoprolol, toresmide, entresto (not high dose)  Controlled T2DM:  - Continue home management  COPD:  - Steroids as above, will give short taper at discharge. - Continue home breathing treatments.  LFT elevation: Mild, likely due to viral infection. Improving. - Continue to monitor at follow up  Discharge Instructions Discharge Instructions    Diet - low sodium heart healthy   Complete by: As directed    Diet Carb Modified   Complete by: As directed    Discharge instructions   Complete by: As directed    You are being discharged from the hospital after treatment for covid-19 infection. - You are felt to be stable enough to no longer require inpatient monitoring, testing, and treatment, though you will need to follow the recommendations below: - Continue taking decadron by mouth daily starting tomorrow for 4 days - Do NOT take spironolactone until you can follow up with your doctor to get your labs checked. Your potassium is slightly elevated and this can be made worse with spironolactone - Remain in isolation for 2 weeks following discharge.  - Do not take NSAID  medications (including, but not limited to, ibuprofen, advil, motrin, naproxen, aleve, goody's powder, etc.) - Follow up with your doctor in the next week via telehealth or  seek medical attention right away if your symptoms get WORSE.  - Consider donating plasma after you have recovered (either 14 days after a negative test or 28 days after symptoms have completely resolved) because your antibodies to this virus may be helpful to give to others with life-threatening infections. Please go to the website www.[email or web address] if you would like to consider volunteering for plasma donation.    Directions for you at home:  Wear a facemask You should wear a facemask that covers your nose and mouth when you are in the same room with other people and when you visit a healthcare provider. People who live with or visit you should also wear a facemask while they are in the same room with you.  Separate yourself from other people in your home As much as possible, you should stay in a different room from other people in your home. Also, you should use a separate bathroom, if available.  Avoid sharing household items You should not share dishes, drinking glasses, cups, eating utensils, towels, bedding, or other items with other people in your home. After using these items, you should wash them thoroughly with soap and water.  Cover your coughs and sneezes Cover your mouth and nose with a tissue when you cough or sneeze, or you can cough or sneeze into your sleeve. Throw used tissues in a lined trash can, and immediately wash your hands with soap and water for at least 20 seconds or use an alcohol-based hand rub.  Wash your [facility] your hands often and thoroughly with soap and water for at least 20 seconds. You can use an alcohol-based hand sanitizer if soap and water are not available and if your hands are not visibly dirty. Avoid touching your eyes, nose, and mouth with unwashed hands.  Directions for those who live with, or provide care at home for you:  Limit the number of people who have contact with the patient If possible, have only one caregiver for the  patient. Other household members should stay in another home or place of residence. If this is not possible, they should stay in another room, or be separated from the patient as much as possible. Use a separate bathroom, if available. Restrict visitors who do not have an essential need to be in the home.  Ensure good ventilation Make sure that shared spaces in the home have good air flow, such as from an air conditioner or an opened window, weather permitting.  Wash your hands often Wash your hands often and thoroughly with soap and water for at least 20 seconds. You can use an alcohol based hand sanitizer if soap and water are not available and if your hands are not visibly dirty. Avoid touching your eyes, nose, and mouth with unwashed hands. Use disposable paper towels to dry your hands. If not available, use dedicated cloth towels and replace them when they become wet.  Wear a facemask and gloves Wear a disposable facemask at all times in the room and gloves when you touch or have contact with the patient's blood, body fluids, and/or secretions or excretions, such as sweat, saliva, sputum, nasal mucus, vomit, urine, or feces.  Ensure the mask fits over your nose and mouth tightly, and do not touch it during use. Throw out disposable facemasks and gloves after using them.  Do not reuse. Wash your hands immediately after removing your facemask and gloves. If your personal clothing becomes contaminated, carefully remove clothing and launder. Wash your hands after handling contaminated clothing. Place all used disposable facemasks, gloves, and other waste in a lined container before disposing them with other household waste. Remove gloves and wash your hands immediately after handling these items.  Do not share dishes, glasses, or other household items with the patient Avoid sharing household items. You should not share dishes, drinking glasses, cups, eating utensils, towels, bedding, or other  items with a patient who is confirmed to have, or being evaluated for, COVID-19 infection. After the person uses these items, you should wash them thoroughly with soap and water.  Wash laundry thoroughly Immediately remove and wash clothes or bedding that have blood, body fluids, and/or secretions or excretions, such as sweat, saliva, sputum, nasal mucus, vomit, urine, or feces, on them. Wear gloves when handling laundry from the patient. Read and follow directions on labels of laundry or clothing items and detergent. In general, wash and dry with the warmest temperatures recommended on the label.  Clean all areas the individual has used often Clean all touchable surfaces, such as counters, tabletops, doorknobs, bathroom fixtures, toilets, phones, keyboards, tablets, and bedside tables, every day. Also, clean any surfaces that may have blood, body fluids, and/or secretions or excretions on them. Wear gloves when cleaning surfaces the patient has come in contact with. Use a diluted bleach solution (e.g., dilute bleach with 1 part bleach and 10 parts water) or a household disinfectant with a label that says EPA-registered for coronaviruses. To make a bleach solution at home, add 1 tablespoon of bleach to 1 quart (4 cups) of water. For a larger supply, add  cup of bleach to 1 gallon (16 cups) of water. Read labels of cleaning products and follow recommendations provided on product labels. Labels contain instructions for safe and effective use of the cleaning product including precautions you should take when applying the product, such as wearing gloves or eye protection and making sure you have good ventilation during use of the product. Remove gloves and wash hands immediately after cleaning.  Monitor yourself for signs and symptoms of illness Caregivers and household members are considered close contacts, should monitor their health, and will be asked to limit movement outside of the home to the  extent possible. Follow the monitoring steps for close contacts listed on the symptom monitoring form.   If you have additional questions, contact your local health department or call the epidemiologist on call at [phone] (available 24/7). This guidance is subject to change. For the most up-to-date guidance from [facility], please refer to their website: YouBlogs.pl   Increase activity slowly   Complete by: As directed      Allergies as of 07/18/2019      Reactions   Ramipril Cough   cough   Percocet [oxycodone-acetaminophen]    In ICU it contributed to hallucinations. Can take by itself      Medication List    STOP taking these medications   amoxicillin 500 MG capsule Commonly known as: AMOXIL   levofloxacin 500 MG tablet Commonly known as: LEVAQUIN     TAKE these medications   acetaminophen 500 MG tablet Commonly known as: TYLENOL Take 1,000 mg by mouth 2 (two) times daily.   albuterol 108 (90 Base) MCG/ACT inhaler Commonly known as: VENTOLIN HFA Inhale 2 puffs into the lungs 2 (two) times daily.   albuterol (5 MG/ML)  0.5% nebulizer solution Commonly known as: PROVENTIL Take 0.5 mLs (2.5 mg total) by nebulization every 2 (two) hours as needed for wheezing or shortness of breath.   aspirin 81 MG tablet Take 81 mg by mouth daily.   atorvastatin 40 MG tablet Commonly known as: LIPITOR Take 1 tablet (40 mg total) by mouth every evening.   benzonatate 200 MG capsule Commonly known as: TESSALON Take 1 capsule (200 mg total) by mouth 3 (three) times daily as needed for cough.   dexamethasone 6 MG tablet Commonly known as: Decadron Take 1 tablet (6 mg total) by mouth daily for 4 days. Start taking on: July 19, 2019   Fish Oil 1200 MG Caps Take 1,200 mg by mouth daily.   metoprolol succinate 50 MG 24 hr tablet Commonly known as: TOPROL-XL Take 1 tablet (50 mg total) by mouth daily. Take with or  immediately following a meal. What changed: when to take this   nitroGLYCERIN 0.4 MG SL tablet Commonly known as: NITROSTAT Place 1 tablet (0.4 mg total) under the tongue every 5 (five) minutes x 3 doses as needed for chest pain.   oxymetazoline 0.05 % nasal spray Commonly known as: AFRIN Place 1 spray into both nostrils daily.   sacubitril-valsartan 24-26 MG Commonly known as: Entresto Take 1 tablet by mouth 2 (two) times daily.   sildenafil 20 MG tablet Commonly known as: REVATIO Take 1 tablet (20 mg total) by mouth 3 (three) times daily.   spironolactone 25 MG tablet Commonly known as: ALDACTONE TAKE 1 TABLET(25 MG) BY MOUTH DAILY What changed: See the new instructions.   torsemide 20 MG tablet Commonly known as: DEMADEX Take 2 tablets (40 mg total) by mouth daily.   Trelegy Ellipta 100-62.5-25 MCG/INH Aepb Generic drug: Fluticasone-Umeclidin-Vilant Inhale 1 puff into the lungs daily. NEEDS TO SCHEDULE OFFICE VISIT What changed: additional instructions   warfarin 4 MG tablet Commonly known as: COUMADIN Take as directed. If you are unsure how to take this medication, talk to your nurse or doctor. Original instructions: Take 1 tablet daily except 1 1/2 tablets on Mondays and Thursdays or as directed What changed:   how much to take  how to take this  when to take this            [place]  (From admission, onward)         Start     Ordered   07/18/19 1157  For home use only DME oxygen  Once    Question Answer Comment  Length of Need 6 Months   Mode or (Route) Nasal cannula   Liters per Minute 3   Frequency Continuous (stationary and portable oxygen unit needed)   Oxygen delivery system Gas      07/18/19 1156         Follow-up Information    [name], DO. Schedule an appointment as soon as possible for a visit in 1 week(s).   Specialty: Osteopathic Medicine Contact information: [id number] [place]  [address] [phone]          Allergies  Allergen Reactions  . Ramipril Cough    cough  . Percocet [Oxycodone-Acetaminophen]     In ICU it contributed to hallucinations. Can take by itself    Consultations:  PCCM  Procedures/Studies: Dg Chest Port 1 View  Result Date: 07/08/2019 CLINICAL DATA:  Dyspnea.  COVID-19 pneumonia. EXAM: PORTABLE CHEST 1 VIEW COMPARISON:  08/12/2018 chest radiograph. FINDINGS: Intact sternotomy wires.  Cardiac valve prosthesis in place. Stable cardiomediastinal silhouette with mild cardiomegaly. No pneumothorax. No pleural effusion. Patchy opacities throughout lungs bilaterally, most prominent in peripheral lower lungs bilaterally. IMPRESSION: Patchy peripheral lower lung opacities bilaterally, compatible with COVID-19 pneumonia. Cardiomegaly. Electronically Signed   By: Ilona Sorrel M.D.   On: 07/08/2019 15:59   Vas [place] Lower Extremity Venous (dvt)  Result Date: 07/13/2019  Lower Venous Study Indications: Swelling, and Edema. Other Indications: COVID 19. Anticoagulation: Coumadin. Performing Technologist: Darlina Sicilian RDCS  Examination Guidelines: A complete evaluation includes B-mode imaging, spectral Doppler, color Doppler, and power Doppler as needed of all accessible portions of each vessel. Bilateral testing is considered an integral part of a complete examination. Limited examinations for reoccurring indications may be performed as noted.  +---------+---------------+---------+-----------+----------+-------------------+ RIGHT    CompressibilityPhasicitySpontaneityPropertiesSummary             +---------+---------------+---------+-----------+----------+-------------------+ CFV      Full           Yes      Yes                                      +---------+---------------+---------+-----------+----------+-------------------+ SFJ      Full                                                              +---------+---------------+---------+-----------+----------+-------------------+ FV Prox  Full                                                             +---------+---------------+---------+-----------+----------+-------------------+ FV Mid   Full                                                             +---------+---------------+---------+-----------+----------+-------------------+ FV DistalFull                                                             +---------+---------------+---------+-----------+----------+-------------------+ PFV      Full                                         only proximal                                                             portion visualized  +---------+---------------+---------+-----------+----------+-------------------+ POP      Full  Yes      Yes                                      +---------+---------------+---------+-----------+----------+-------------------+ PTV      Full                                                             +---------+---------------+---------+-----------+----------+-------------------+ PERO     Full                                                             +---------+---------------+---------+-----------+----------+-------------------+   +---------+---------------+---------+-----------+----------+-------+ LEFT     CompressibilityPhasicitySpontaneityPropertiesSummary +---------+---------------+---------+-----------+----------+-------+ CFV      Full           Yes      Yes                          +---------+---------------+---------+-----------+----------+-------+ SFJ      Full                                                 +---------+---------------+---------+-----------+----------+-------+ FV Prox  Full                                                 +---------+---------------+---------+-----------+----------+-------+ FV Mid   Full                                                  +---------+---------------+---------+-----------+----------+-------+ FV DistalFull                                                 +---------+---------------+---------+-----------+----------+-------+ POP      Full           Yes      Yes                          +---------+---------------+---------+-----------+----------+-------+ PTV      Full                                                 +---------+---------------+---------+-----------+----------+-------+ PERO     Full                                                 +---------+---------------+---------+-----------+----------+-------+  Summary: Right: No evidence of deep vein thrombosis in the lower extremity. No indirect evidence of obstruction proximal to the inguinal ligament. There is no evidence of deep vein thrombosis in the lower extremity. No cystic structure found in the popliteal fossa. Left: No evidence of deep vein thrombosis in the lower extremity. No indirect evidence of obstruction proximal to the inguinal ligament. There is no evidence of deep vein thrombosis in the lower extremity. A cystic structure is found in the popliteal fossa.  *See table(s) above for measurements and observations. Electronically signed by Servando Snare MD on 07/13/2019 at 2:17:37 PM.    Final    [place] Ekg Site Rite  Result Date: 07/13/2019 If Site Rite image not attached, placement could not be confirmed due to current cardiac rhythm.    Subjective: Feels much better, walking without shortness of breath, denies wheezing in past 24 hours. No chest pain or other concerns, down to 1L this AM.  Discharge Exam: Vitals:   07/18/19 0758 07/18/19 0759  BP: 117/66   Pulse: 65 65  Resp: (!) 0 (!) 9  Temp: 98 F (36.7 C)   SpO2: 94% 98%   General: Pt is alert, awake, not in acute distress Cardiovascular: RRR, mechanical S2, II VI systolic murmur, no rubs, no gallops Respiratory: Nonlabored without  crackles or wheezes. Abdominal: Soft, NT, ND, bowel sounds + Extremities: No edema, no cyanosis  Labs: BNP (last 3 results) Recent Labs    07/28/18 0501  BNP [id number]*   Basic Metabolic Panel: Recent Labs  Lab 07/12/19 0236 07/13/19 0340  07/15/19 0410 07/16/19 0430 07/17/19 0458 07/17/19 2245 07/18/19 0514  NA 136 137   < > 136 137 136 137 135  K 5.1 5.2*   < > 4.6 4.5 5.2* 5.2* 5.3*  CL 95* 98   < > 92* 94* 97* 97* 98  CO2 30 27   < > 33* 30 30 30 26   GLUCOSE 159* 140*   < > 159* 122* 159* 119* 131*  BUN 44* 38*   < > 47* 42* 35* 34* 32*  CREATININE 1.17 1.00   < > 1.13 1.01 0.94 0.91 0.88  CALCIUM 8.6* 8.7*   < > 8.5* 8.5* 8.3* 8.7* 8.5*  MG 2.0 2.2  --   --   --   --  2.4  --   PHOS 4.3 4.7*  --   --   --   --   --   --    < > = values in this interval not displayed.   Liver Function Tests: Recent Labs  Lab 07/14/19 0225 07/15/19 0410 07/16/19 0430 07/17/19 0458 07/18/19 0514  AST 64* 46* 41 41 36  ALT 78* 64* 56* 52* 46*  ALKPHOS 82 66 63 58 55  BILITOT 1.0 1.2 1.3* 0.9 0.8  PROT 6.5 6.3* 6.1* 5.6* 5.5*  ALBUMIN 3.2* 3.1* 3.1* 2.9* 3.0*   No results for input(s): LIPASE, AMYLASE in the last 168 hours. No results for input(s): AMMONIA in the last 168 hours. CBC: Recent Labs  Lab 07/12/19 0236 07/13/19 0340 07/14/19 0225 07/15/19 0410 07/16/19 0430  WBC 13.1* 18.4* 19.3* 21.0* 19.6*  NEUTROABS 11.2* 16.0* 16.9* 18.2* 17.3*  HGB 11.6* 12.1* 12.3* 12.1* 11.8*  HCT 36.2* 37.5* 37.9* 37.4* 36.8*  MCV 91.0 91.0 89.4 89.5 90.9  PLT 372 373 417* 377 369   Cardiac Enzymes: No results for input(s): CKTOTAL, CKMB, CKMBINDEX, TROPONINI in the last 168 hours. BNP: Invalid input(s): POCBNP CBG:  Recent Labs  Lab 07/17/19 0754 07/17/19 1111 07/17/19 1736 07/17/19 2130 07/18/19 0800  GLUCAP 136* 148* 107* 114* 136*   D-Dimer Recent Labs    07/17/19 0458 07/18/19 0514  DDIMER 7.79* 3.40*   Hgb A1c No results for input(s): HGBA1C in the last 72  hours. Lipid Profile No results for input(s): CHOL, HDL, LDLCALC, TRIG, CHOLHDL, LDLDIRECT in the last 72 hours. Thyroid function studies No results for input(s): TSH, T4TOTAL, T3FREE, THYROIDAB in the last 72 hours.  Invalid input(s): FREET3 Anemia work up Recent Labs    07/17/19 0458 07/18/19 0514  FERRITIN 1,032* 823*   Urinalysis    Component Value Date/Time   COLORURINE YELLOW 07/15/2018 [place] 07/15/2018 1402   LABSPEC 1.013 07/15/2018 1402   PHURINE 5.0 07/15/2018 1402   GLUCOSEU NEGATIVE 07/15/2018 1402   HGBUR SMALL (A) 07/15/2018 1402   BILIRUBINUR NEGATIVE 07/15/2018 1402   BILIRUBINUR NEGATIVE 04/30/2016 1516   KETONESUR NEGATIVE 07/15/2018 1402   PROTEINUR NEGATIVE 07/15/2018 1402   UROBILINOGEN 4.0 04/30/2016 1516   NITRITE NEGATIVE 07/15/2018 1402   LEUKOCYTESUR NEGATIVE 07/15/2018 1402    Microbiology Recent Results (from the past 240 hour(s))  Blood Culture (routine x 2)     Status: None   Collection Time: 07/08/19  2:56 PM   Specimen: Right Antecubital; Blood  Result Value Ref Range Status   Specimen Description RIGHT ANTECUBITAL  Final   Special Requests   Final    BOTTLES DRAWN AEROBIC AND ANAEROBIC Blood Culture adequate volume   Culture   Final    NO GROWTH 5 DAYS Performed at [facility], [address] [place], [place] [address]    Report Status 07/13/2019 FINAL  Final  Blood Culture (routine x 2)     Status: None   Collection Time: 07/08/19  3:29 PM   Specimen: BLOOD RIGHT HAND  Result Value Ref Range Status   Specimen Description BLOOD RIGHT HAND  Final   Special Requests   Final    BOTTLES DRAWN AEROBIC AND ANAEROBIC Blood Culture adequate volume   Culture   Final    NO GROWTH 5 DAYS Performed at [facility], [address] [place], [place] [address]    Report Status 07/13/2019 FINAL  Final    Time coordinating discharge: Approximately 40 minutes  Patrecia Pour, MD  [facility] 07/18/2019, 11:57 AM

## 2019-07-18 NOTE — TOC Transition Note (Signed)
Transition of Care [facility]) - CM/SW Discharge Note   Patient Details  Name: Jeffrey Larson MRN: [id number] Date of Birth: 05-28-1956  Transition of Care [facility]) CM/SW Contact:  Ninfa Meeker, RN Phone Number: [phone] (working remotely) 07/18/2019, 4:14 PM   Clinical Narrative:   Case manager received notification that patient will need oxygen for home. Oxygen saturations are charted. Patient has declined [place] as noted on 8/ Referral called to Learta Codding with [employer]. Concentrator and tanks will be delivered to patient's home.    Final next level of care: Home/Self Care Barriers to Discharge: No Barriers Identified   Patient Goals and CMS Choice     Choice offered to / list presented to : Patient  Discharge Placement                       Discharge Plan and Services   Discharge Planning Services: CM Consult Post Acute Care Choice: Home Health          DME Arranged: Oxygen DME Agency: [place] Date DME Agency Contacted: 07/18/19 Time DME Agency Contacted: [id number] Representative spoke with at DME Agency: Learta Codding [place]: Patient Refused [place]          Social Determinants of Health ([place]) Interventions     Readmission Risk Interventions Readmission Risk Prevention Plan 07/31/2018  Transportation Screening Complete  PCP or Specialist Appt within 5-7 Days Complete  Home Care Screening Complete  Medication Review (RN CM) Complete  Some recent data might be hidden

## 2019-07-18 NOTE — Progress Notes (Signed)
SATURATION QUALIFICATIONS: (This note is used to comply with regulatory documentation for home oxygen)  Patient Saturations on Room Air at Rest = 95%  Patient Saturations on Room Air while Ambulating = 85%  Patient Saturations on 3 Liters of oxygen while Ambulating = 92%  Please briefly explain why patient needs home oxygen: patient requires oxygen therapy when ambulating

## 2019-07-18 NOTE — Progress Notes (Signed)
ANTICOAGULATION CONSULT NOTE - Follow Up Consult  Pharmacy Consult for warfarin Indication: Afib, mechanical mitral valve replacement  Allergies  Allergen Reactions  . Ramipril Cough    cough  . Percocet [Oxycodone-Acetaminophen]     In ICU it contributed to hallucinations. Can take by itself    Patient Measurements: Height: 5\' 11"  (180.3 cm) Weight: 300 lb (136.1 kg) IBW/kg (Calculated) : 75.3   Vital Signs: Temp: 98 F (36.7 C) (08/22 0758) Temp Source: Oral (08/22 0758) BP: 117/66 (08/22 0758) Pulse Rate: 65 (08/22 0759)  Labs: Recent Labs    07/16/19 0430 07/17/19 0458 07/17/19 2245 07/18/19 0514  HGB 11.8*  --   --   --   HCT 36.8*  --   --   --   PLT 369  --   --   --   LABPROT 27.3* 30.6*  --  30.2*  INR 2.6* 3.0*  --  2.9*  CREATININE 1.01 0.94 0.91 0.88    Estimated Creatinine Clearance: 122.6 mL/min (by C-G formula based on SCr of 0.88 mg/dL).   Medications:  Scheduled:  . albuterol  2 puff Inhalation Q6H  . aspirin EC  81 mg Oral Daily  . atorvastatin  40 mg Oral QPM  . fluticasone furoate-vilanterol  1 puff Inhalation Daily   And  . umeclidinium bromide  1 puff Inhalation Daily  . insulin aspart  0-15 Units Subcutaneous TID WC  . insulin aspart  0-5 Units Subcutaneous QHS  . insulin aspart  4 Units Subcutaneous TID WC  . mouth rinse  15 mL Mouth Rinse BID  . methylPREDNISolone (SOLU-MEDROL) injection  60 mg Intravenous Q8H  . metoprolol succinate  50 mg Oral Daily  . sodium chloride flush  10-40 mL Intracatheter Q12H  . Warfarin - Pharmacist Dosing Inpatient   Does not apply q1800   Infusions:    Assessment: [name] admitted on 8/12 for COVID-19 pneumonia with PMH of Afib, mitral valve replacement with metallic valve, tricuspid valve repair, hypertension, COPD, CAD, hyperlipidemia, obesity, chronic diastolic CHF.  Pharmacy is consulted to continue warfarin dosing. PTA warfarin: 4mg  daily except 6 mg on Mon/Thursday.  Unknown last dose.   Admission INR 1.8  INR remains therapeutic and stable at 2.9.   Decadron was switched to methylprednisolone to decrease interaction potential with warfarin   Goal of Therapy:  INR 2.5-3.5 Monitor platelets by anticoagulation protocol: Yes   Plan:  Warfarin 4 mg x 1 dose today Daily PT/INR. Monitor for signs and symptoms of bleeding.  Albertina Parr, PharmD., BCPS Clinical Pharmacist Clinical phone for 07/18/19 until 5pm: [phone]

## 2019-07-18 NOTE — Progress Notes (Signed)
Patient is actively speaking with the family throughout the day. Updated on discharge plans this morning

## 2019-07-19 ENCOUNTER — Other Ambulatory Visit: Payer: Self-pay | Admitting: Osteopathic Medicine

## 2019-07-20 ENCOUNTER — Encounter: Payer: BC Managed Care – PPO | Admitting: Thoracic Surgery (Cardiothoracic Vascular Surgery)

## 2019-07-20 ENCOUNTER — Encounter (HOSPITAL_COMMUNITY): Payer: BC Managed Care – PPO

## 2019-07-20 LAB — GLUCOSE, CAPILLARY: Glucose-Capillary: 181 mg/dL — ABNORMAL HIGH (ref 70–99)

## 2019-07-22 ENCOUNTER — Encounter (HOSPITAL_COMMUNITY): Payer: BC Managed Care – PPO

## 2019-07-24 ENCOUNTER — Encounter (HOSPITAL_COMMUNITY): Payer: BC Managed Care – PPO

## 2019-07-27 ENCOUNTER — Other Ambulatory Visit (HOSPITAL_COMMUNITY): Payer: Self-pay

## 2019-07-27 ENCOUNTER — Encounter (HOSPITAL_COMMUNITY): Payer: BC Managed Care – PPO

## 2019-07-27 MED ORDER — TORSEMIDE 20 MG PO TABS: 40.0000 mg | ORAL_TABLET | Freq: Every day | ORAL | 11 refills | Status: DC

## 2019-07-29 ENCOUNTER — Encounter (HOSPITAL_COMMUNITY): Payer: BC Managed Care – PPO

## 2019-07-30 ENCOUNTER — Other Ambulatory Visit: Payer: Self-pay

## 2019-07-30 DIAGNOSIS — Z20822 Contact with and (suspected) exposure to covid-19: Secondary | ICD-10-CM

## 2019-07-30 DIAGNOSIS — R6889 Other general symptoms and signs: Secondary | ICD-10-CM | POA: Diagnosis not present

## 2019-07-31 ENCOUNTER — Emergency Department (HOSPITAL_COMMUNITY): Payer: BC Managed Care – PPO

## 2019-07-31 ENCOUNTER — Emergency Department (HOSPITAL_COMMUNITY)
Admission: EM | Admit: 2019-07-31 | Discharge: 2019-07-31 | Disposition: A | Discharge status: Home / Self Care | Payer: BC Managed Care – PPO | Attending: Emergency Medicine | Admitting: Emergency Medicine

## 2019-07-31 ENCOUNTER — Telehealth: Payer: Self-pay

## 2019-07-31 ENCOUNTER — Other Ambulatory Visit: Payer: Self-pay

## 2019-07-31 ENCOUNTER — Encounter (HOSPITAL_COMMUNITY): Payer: Self-pay

## 2019-07-31 DIAGNOSIS — M79604 Pain in right leg: Secondary | ICD-10-CM

## 2019-07-31 DIAGNOSIS — Z79899 Other long term (current) drug therapy: Secondary | ICD-10-CM | POA: Insufficient documentation

## 2019-07-31 DIAGNOSIS — J189 Pneumonia, unspecified organism: Secondary | ICD-10-CM | POA: Diagnosis not present

## 2019-07-31 DIAGNOSIS — R0602 Shortness of breath: Secondary | ICD-10-CM

## 2019-07-31 DIAGNOSIS — Z20828 Contact with and (suspected) exposure to other viral communicable diseases: Secondary | ICD-10-CM | POA: Insufficient documentation

## 2019-07-31 DIAGNOSIS — I10 Essential (primary) hypertension: Secondary | ICD-10-CM | POA: Insufficient documentation

## 2019-07-31 DIAGNOSIS — I2510 Atherosclerotic heart disease of native coronary artery without angina pectoris: Secondary | ICD-10-CM | POA: Diagnosis not present

## 2019-07-31 DIAGNOSIS — Z87891 Personal history of nicotine dependence: Secondary | ICD-10-CM | POA: Diagnosis not present

## 2019-07-31 DIAGNOSIS — M79661 Pain in right lower leg: Secondary | ICD-10-CM | POA: Diagnosis not present

## 2019-07-31 DIAGNOSIS — J449 Chronic obstructive pulmonary disease, unspecified: Secondary | ICD-10-CM | POA: Diagnosis not present

## 2019-07-31 DIAGNOSIS — Z7982 Long term (current) use of aspirin: Secondary | ICD-10-CM | POA: Insufficient documentation

## 2019-07-31 LAB — CBC WITH DIFFERENTIAL/PLATELET
Abs Immature Granulocytes: 0.08 10*3/uL — ABNORMAL HIGH (ref 0.00–0.07)
Basophils Absolute: 0 10*3/uL (ref 0.0–0.1)
Basophils Relative: 0 %
Eosinophils Absolute: 0.1 10*3/uL (ref 0.0–0.5)
Eosinophils Relative: 1 %
HCT: 37.4 % — ABNORMAL LOW (ref 39.0–52.0)
Hemoglobin: 11.6 g/dL — ABNORMAL LOW (ref 13.0–17.0)
Immature Granulocytes: 1 %
Lymphocytes Relative: 9 %
Lymphs Abs: 0.7 10*3/uL (ref 0.7–4.0)
MCH: 29.8 pg (ref 26.0–34.0)
MCHC: 31 g/dL (ref 30.0–36.0)
MCV: 96.1 fL (ref 80.0–100.0)
Monocytes Absolute: 0.5 10*3/uL (ref 0.1–1.0)
Monocytes Relative: 6 %
Neutro Abs: 6.8 10*3/uL (ref 1.7–7.7)
Neutrophils Relative %: 83 %
Platelets: 127 10*3/uL — ABNORMAL LOW (ref 150–400)
RBC: 3.89 MIL/uL — ABNORMAL LOW (ref 4.22–5.81)
RDW: 16.8 % — ABNORMAL HIGH (ref 11.5–15.5)
WBC: 8.1 10*3/uL (ref 4.0–10.5)
nRBC: 0 % (ref 0.0–0.2)

## 2019-07-31 LAB — LACTIC ACID, PLASMA: Lactic Acid, Venous: 1.8 mmol/L (ref 0.5–1.9)

## 2019-07-31 LAB — COMPREHENSIVE METABOLIC PANEL
ALT: 35 U/L (ref 0–44)
AST: 32 U/L (ref 15–41)
Albumin: 3.2 g/dL — ABNORMAL LOW (ref 3.5–5.0)
Alkaline Phosphatase: 51 U/L (ref 38–126)
Anion gap: 12 (ref 5–15)
BUN: 14 mg/dL (ref 8–23)
CO2: 23 mmol/L (ref 22–32)
Calcium: 8.3 mg/dL — ABNORMAL LOW (ref 8.9–10.3)
Chloride: 102 mmol/L (ref 98–111)
Creatinine, Ser: 0.82 mg/dL (ref 0.61–1.24)
GFR calc Af Amer: >60 mL/min (ref >60)
GFR calc non Af Amer: >60 mL/min (ref >60)
Glucose, Bld: 109 mg/dL — ABNORMAL HIGH (ref 70–99)
Potassium: 4.4 mmol/L (ref 3.5–5.1)
Sodium: 137 mmol/L (ref 135–145)
Total Bilirubin: 0.9 mg/dL (ref 0.3–1.2)
Total Protein: 6.6 g/dL (ref 6.5–8.1)

## 2019-07-31 LAB — SARS CORONAVIRUS 2 BY RT PCR (HOSPITAL ORDER, PERFORMED IN ~~LOC~~ HOSPITAL LAB): SARS Coronavirus 2: NEGATIVE

## 2019-07-31 LAB — PROTIME-INR
INR: 3.3 — ABNORMAL HIGH (ref 0.8–1.2)
Prothrombin Time: 32.9 seconds — ABNORMAL HIGH (ref 11.4–15.2)

## 2019-07-31 LAB — TROPONIN I (HIGH SENSITIVITY)
Troponin I (High Sensitivity): 21 ng/L — ABNORMAL HIGH (ref <18)
Troponin I (High Sensitivity): 21 ng/L — ABNORMAL HIGH (ref <18)

## 2019-07-31 LAB — BRAIN NATRIURETIC PEPTIDE: B Natriuretic Peptide: 102 pg/mL — ABNORMAL HIGH (ref 0.0–100.0)

## 2019-07-31 MED ORDER — DEXAMETHASONE 4 MG PO TABS: 4.0000 mg | ORAL_TABLET | Freq: Two times a day (BID) | ORAL | 0 refills | Status: DC

## 2019-07-31 MED ORDER — ONDANSETRON HCL 4 MG PO TABS
4.0000 mg | ORAL_TABLET | Freq: Once | ORAL | Status: AC
  Administered 2019-07-31: 4 mg via ORAL
  Filled 2019-07-31: qty 1

## 2019-07-31 MED ORDER — HYDROCODONE-ACETAMINOPHEN 5-325 MG PO TABS: 1.0000 | ORAL_TABLET | ORAL | 0 refills | Status: DC | PRN

## 2019-07-31 MED ORDER — HYDROCODONE-ACETAMINOPHEN 5-325 MG PO TABS
2.0000 | ORAL_TABLET | Freq: Once | ORAL | Status: AC
  Administered 2019-07-31: 2 via ORAL
  Filled 2019-07-31: qty 2

## 2019-07-31 MED ORDER — ALBUTEROL SULFATE HFA 108 (90 BASE) MCG/ACT IN AERS
2.0000 | INHALATION_SPRAY | Freq: Once | RESPIRATORY_TRACT | Status: AC
  Administered 2019-07-31: 2 via RESPIRATORY_TRACT
  Filled 2019-07-31: qty 6.7

## 2019-07-31 MED ORDER — AMOXICILLIN-POT CLAVULANATE 875-125 MG PO TABS: 1.0000 | ORAL_TABLET | Freq: Two times a day (BID) | ORAL | 0 refills | Status: DC

## 2019-07-31 MED ORDER — AMOXICILLIN-POT CLAVULANATE 875-125 MG PO TABS
1.0000 | ORAL_TABLET | Freq: Once | ORAL | Status: AC
  Administered 2019-07-31: 1 via ORAL
  Filled 2019-07-31: qty 1

## 2019-07-31 NOTE — Telephone Encounter (Signed)
Noted, thanks! Agree with triage nurse recommendation for ER transfer

## 2019-07-31 NOTE — ED Triage Notes (Addendum)
Pt presents to ED, sent by PCP for evaluation of right calf pain x 3-4 days down to ankle. Pt also with nose bleed since this am, pt states he is on coumadin since d/c from [facility] for [employer]. Pt states he is more SOB starting today. Pt states he has been using 1L of O2 at home. Pt O2 sat in triage 80% placed on 2L and increased to 87%. Pt taken to room for EDP to evaluate.

## 2019-07-31 NOTE — Discharge Instructions (Addendum)
The ultrasound of your leg is negative for blood clots.  You have an area of increased redness and warmth there.  This does raise some question about possible cellulitis.  Your white blood cell count is well within normal limits.  And your lactic acid test is also negative.  Please elevate your leg as much as possible above your waist.  Your chest x-ray shows worsening of the lung markings, and questions pneumonia.  Your oxygen level when you arrived was low.  Please increase your oxygen at home 2 to 3 L.  Please use 2 puffs of albuterol every 4 hours, use Decadron 2 times daily with food.  Please use Augmentin 2 times daily with food both for the reddened area on your leg, as well as the questionable pneumonia.  Please have your leg and your chest reevaluated on Tuesday, September 7.  If you cannot be seen by your primary physician, please return to the emergency department for evaluation.  Please return to the emergency department sooner if any high fever, coughing up blood, nausea/vomiting, worsening of the redness and pain of your leg, or difficulty with breathing, changes in your condition, problems or concerns.  Your PT was 32.8 seconds, and your INR was 3.3.  Please discuss this with your physician as soon as possible.  Use Tylenol every 4 hours for mild pain, and for any fever higher than 100.  Use Norco every 4 hours as needed for more severe pain or discomfort. This medication may cause drowsiness. Please do not drink, drive, or participate in activity that requires concentration while taking this medication.

## 2019-07-31 NOTE — ED Provider Notes (Signed)
[facility] EMERGENCY DEPARTMENT Provider Note   CSN: [id number] Arrival date & time: 07/31/19  1042     History   Chief Complaint Chief Complaint  Patient presents with  . Leg Pain  . Shortness of Breath    HPI Jeffrey Larson is a 63 y.o. male.     Patient is a 63 year old male who presents to the emergency department with a complaint of leg pain and increasing shortness of breath.  The patient states that he has had 3 to 4 days of increasing right calf pain.  This pain goes from his calf down to his ankle.  The patient states that he was diagnosed with COVID-19 virus 23 days ago.  He is at home to.  The patient was evaluated by his primary physician and was told to come to the emergency department for additional evaluation.  The patient states he has had some increase in nosebleeds recently.  He uses home O2, and he is on Coumadin because of heart valve replacements.  The patient states that he has been more short of breath recently.  He is on home O2, but states that when he gets up to go from one room to the other he feels more short of breath than usual.  He said that he had a COVID-19 test done this week, but he has not received the results back yet.  He has minimal cough.  He says he is not had fever that he is aware of, but has not been consistent in checking his temperature at home.  No hemoptysis reported.  No recent fall or injury to the chest or rib area.  The history is provided by the patient.  Leg Pain Associated symptoms: no back pain and no neck pain   Shortness of Breath Associated symptoms: no abdominal pain, no chest pain, no cough, no neck pain and no wheezing     Past Medical History:  Diagnosis Date  . Chronic combined systolic and diastolic congestive heart failure ([place])   . COPD with chronic bronchitis ([place])   . Coronary artery disease   . Essential hypertension, benign    Ramipril to losartan Sept 2015   . History of pneumonia    RML on CXR  07/20/14  . Hyperlipidemia   . Longstanding persistent atrial fibrillation   . Mitral stenosis with regurgitation   . Myocardial infarction ([place])   . Pulmonary hypertension ([place])   . Renal disorder    history kidney stone  . S/P Maze operation for atrial fibrillation 07/18/2018   Complete bilateral atrial lesion set using bipolar radiofrequency and cryothermy ablation with clipping of LA appendage  . S/P mitral valve replacement with metallic valve 0000000   Sorin Carbomedics Optiform bileaflet mechanical valve, size 33 mm  . S/P TVR (tricuspid valve repair) 07/18/2018   Edwards mc3 ring annuloplasty, size 30 mm    Patient Active Problem List   Diagnosis Date Noted  . 2019 novel coronavirus disease (COVID-19) 07/08/2019  . COVID-19 virus infection 07/08/2019  . Encounter for therapeutic drug monitoring 08/04/2018  . Pressure injury of skin 07/30/2018  . S/P mitral valve replacement with metallic valve + tricuspid valve repair + maze procedure 07/18/2018  . S/P TVR (tricuspid valve repair) 07/18/2018  . S/P Maze operation for atrial fibrillation 07/18/2018  . Tricuspid valve insufficiency 07/15/2018  . Persistent atrial fibrillation 07/15/2018  . Morbid obesity ([place])   . Chronic combined systolic and diastolic congestive heart failure ([place])   . Pulmonary  hypertension ([place])   . Cold sore 10/28/2017  . Neck pain 10/01/2017  . Coronary artery disease   . Controlled type 2 diabetes mellitus without complication, without long-term current use of insulin ([place]) 08/02/2017  . Cardiac disease 04/18/2017  . Longstanding persistent atrial fibrillation   . Acute respiratory failure with hypoxia ([place])   . Chronic combined systolic and diastolic heart failure ([place]) 11/12/2016  . Right shoulder pain 07/09/2016  . Bilateral low back pain without sciatica 04/30/2016  . Hematuria, microscopic 04/30/2016  . COPD with chronic bronchitis ([place]) 09/08/2014  . Mitral stenosis with regurgitation   .  History of MI (myocardial infarction) 08/04/2014  . Hyperlipidemia 08/04/2014  . Essential hypertension, benign 08/04/2014    Past Surgical History:  Procedure Laterality Date  . APPENDECTOMY    . CARDIOVASCULAR STRESS TEST  03/2014   Borderline reversible ischemic changes at the apex.  Normal LV contractility/EF 52%.  . CORONARY STENT PLACEMENT  7/12013  . LEFT HEART CATHETERIZATION WITH CORONARY ANGIOGRAM N/A 06/12/2012   Procedure: LEFT HEART CATHETERIZATION WITH CORONARY ANGIOGRAM;  Surgeon: Clent Demark, MD;  Location: [facility] CATH LAB;  Service: Cardiovascular;  Laterality: N/A;  . MAZE N/A 07/18/2018   Procedure: MAZE;  Surgeon: Rexene Alberts, MD;  Location: [place];  Service: Open Heart Surgery;  Laterality: N/A;  . MITRAL VALVE REPLACEMENT N/A 07/18/2018   Procedure: MITRAL VALVE (MV) REPLACEMENT WITH CARBOMEDICS OPTIFORM MITRAL VALVE SIZE 33.;  Surgeon: Rexene Alberts, MD;  Location: [place];  Service: Open Heart Surgery;  Laterality: N/A;  . PERCUTANEOUS CORONARY STENT INTERVENTION (PCI-S) N/A 06/17/2012   Procedure: PERCUTANEOUS CORONARY STENT INTERVENTION (PCI-S);  Surgeon: Clent Demark, MD;  Location: [facility] CATH LAB;  Service: Cardiovascular;  Laterality: N/A;  . RIGHT/LEFT HEART CATH AND CORONARY ANGIOGRAPHY N/A 06/10/2018   Procedure: RIGHT/LEFT HEART CATH AND CORONARY ANGIOGRAPHY;  Surgeon: Dixie Dials, MD;  Location: [place] CV LAB;  Service: Cardiovascular;  Laterality: N/A;  . TEE WITHOUT CARDIOVERSION N/A 06/10/2018   Procedure: TRANSESOPHAGEAL ECHOCARDIOGRAM (TEE) with bubble study;  Surgeon: Dixie Dials, MD;  Location: [facility] ENDOSCOPY;  Service: Cardiovascular;  Laterality: N/A;  . TEE WITHOUT CARDIOVERSION N/A 07/18/2018   Procedure: TRANSESOPHAGEAL ECHOCARDIOGRAM (TEE);  Surgeon: Rexene Alberts, MD;  Location: [place];  Service: Open Heart Surgery;  Laterality: N/A;  . TRICUSPID VALVE REPLACEMENT N/A 07/18/2018   Procedure: TRICUSPID VALVE REPAIR WITH EDWARDS MC3  TRICUSPID ANNULOPLASTY RING SIZE 30.;  Surgeon: Rexene Alberts, MD;  Location: [place];  Service: Open Heart Surgery;  Laterality: N/A;        Home Medications    Prior to Admission medications   Medication Sig Start Date End Date Taking? Authorizing Provider  acetaminophen (TYLENOL) 500 MG tablet Take 1,000 mg by mouth 2 (two) times daily.   Yes [provider]  albuterol (PROVENTIL) (5 MG/ML) 0.5% nebulizer solution Take 0.5 mLs (2.5 mg total) by nebulization every 2 (two) hours as needed for wheezing or shortness of breath. 06/24/19  Yes [name], DO  albuterol (VENTOLIN HFA) 108 (90 Base) MCG/ACT inhaler Inhale 2 puffs into the lungs 2 (two) times daily. 06/24/19  Yes [name], DO  aspirin 81 MG tablet Take 81 mg by mouth daily.   Yes [provider]  atorvastatin (LIPITOR) 40 MG tablet Take 1 tablet (40 mg total) by mouth every evening. 01/20/19  Yes Herminio Commons, MD  metoprolol succinate (TOPROL-XL) 50 MG 24 hr tablet Take 1 tablet (50 mg  total) by mouth daily. Take with or immediately following a meal. 07/31/18  Yes Barrett, Erin R, PA-C  nitroGLYCERIN (NITROSTAT) 0.4 MG SL tablet Place 1 tablet (0.4 mg total) under the tongue every 5 (five) minutes x 3 doses as needed for chest pain. 12/03/18 01/26/25 Yes Bensimhon, Shaune Pascal, MD  Omega-3 Fatty Acids (FISH OIL) 1200 MG CAPS Take 1,200 mg by mouth daily.   Yes [provider]  oxymetazoline (AFRIN) 0.05 % nasal spray Place 1 spray into both nostrils daily.   Yes [provider]  sacubitril-valsartan (ENTRESTO) 24-26 MG Take 1 tablet by mouth 2 (two) times daily. 03/06/19  Yes Duke, Tami Lin, PA  sildenafil (REVATIO) 20 MG tablet Take 1 tablet (20 mg total) by mouth 3 (three) times daily. 12/23/18  Yes Bensimhon, Shaune Pascal, MD  spironolactone (ALDACTONE) 25 MG tablet TAKE 1 TABLET(25 MG) BY MOUTH DAILY Patient taking differently: Take 25 mg by mouth daily.  06/17/19  Yes Herminio Commons, MD  torsemide (DEMADEX) 20 MG tablet Take 2 tablets (40 mg total) by mouth daily. 07/27/19  Yes Clegg, Amy D, NP  TRELEGY ELLIPTA 100-62.5-25 MCG/INH AEPB INHALE 1 PUFF INTO THE LUNGS DAILY 07/20/19  Yes [name], DO  warfarin (COUMADIN) 4 MG tablet Take 1 tablet daily except 1 1/2 tablets on Mondays and Thursdays or as directed Patient taking differently: Take 4-6 mg by mouth See admin instructions. 6 mg on Monday and 6 mg on Thursday. 4 mg every other day 06/02/19  Yes Herminio Commons, MD    Family History Family History  Problem Relation Age of Onset  . Heart disease Mother   . Heart disease Father     Social History Social History   Tobacco Use  . Smoking status: Former Smoker    Packs/day: 2.00    Years: 15.00    Pack years: 30.00    Types: Cigarettes    Quit date: 11/26/1996    Years since quitting: 22.6  . Smokeless tobacco: Never Used  Substance Use Topics  . Alcohol use: No  . Drug use: No     Allergies   Ramipril and Percocet [oxycodone-acetaminophen]   Review of Systems Review of Systems  Constitutional: Negative for activity change.       All ROS Neg except as noted in HPI  HENT: Positive for nosebleeds.   Eyes: Negative for photophobia and discharge.  Respiratory: Positive for shortness of breath. Negative for cough and wheezing.   Cardiovascular: Negative for chest pain and palpitations.  Gastrointestinal: Negative for abdominal pain and blood in stool.  Genitourinary: Negative for dysuria, frequency and hematuria.  Musculoskeletal: Negative for arthralgias, back pain and neck pain.       Leg pain  Skin: Negative.   Neurological: Negative for dizziness, seizures and speech difficulty.  Psychiatric/Behavioral: Negative for confusion and hallucinations.     Physical Exam Updated Vital Signs BP (!) 109/48   Pulse 100   Temp 97.9 F (36.6 C) (Oral)   Resp (!) 28   Ht 5\' 11"  (1.803 m)   Wt 129.3 kg   SpO2 (!) 81%   BMI 39.75  kg/m   Physical Exam Vitals signs and nursing note reviewed.  Constitutional:      Appearance: He is well-developed. He is not toxic-appearing.  HENT:     Head: Normocephalic.     Comments: Patient receiving oxygen oxygen by nasal cannula.    Right Ear: Tympanic membrane and external ear normal.  Left Ear: Tympanic membrane and external ear normal.  Eyes:     General: Lids are normal.     Pupils: Pupils are equal, round, and reactive to light.  Neck:     Musculoskeletal: Normal range of motion and neck supple.     Vascular: No carotid bruit.  Cardiovascular:     Rate and Rhythm: Normal rate and regular rhythm.     Pulses: Normal pulses.     Heart sounds: Murmur present. Systolic murmur present with a grade of 2/6.     Comments: Patient has a 2/6 systolic murmur with the click of a prosthetic valve. Pulmonary:     Effort: No respiratory distress.     Breath sounds: Normal breath sounds.  Abdominal:     General: Bowel sounds are normal.     Palpations: Abdomen is soft.     Tenderness: There is no abdominal tenderness. There is no guarding.  Musculoskeletal: Normal range of motion.     Comments: There is increased redness with increased warmth of the anterior tibial area on the right from just below the anterior tibial tuberosity to the ankle.  There is mild to moderate pain to palpation of the right calf.  No significant swelling appreciated.  Dorsalis pedis pulses 2+.  Capillary refill is less than 2 seconds bilaterally.  Lymphadenopathy:     Head:     Right side of head: No submandibular adenopathy.     Left side of head: No submandibular adenopathy.     Cervical: No cervical adenopathy.  Skin:    General: Skin is warm and dry.  Neurological:     Mental Status: He is alert and oriented to person, place, and time.     Cranial Nerves: No cranial nerve deficit.     Sensory: No sensory deficit.  Psychiatric:        Speech: Speech normal.      ED Treatments / Results   Labs (all labs ordered are listed, but only abnormal results are displayed) Labs Reviewed  PROTIME-INR  COMPREHENSIVE METABOLIC PANEL  CBC WITH DIFFERENTIAL/PLATELET    EKG None  Radiology No results found.  Procedures Procedures (including critical care time) CRITICAL CARE Performed by: Lily Kocher Total critical care time: *30** minutes Critical care time was exclusive of separately billable procedures and treating other patients. Critical care was necessary to treat or prevent imminent or life-threatening deterioration. Critical care was time spent personally by me on the following activities: development of treatment plan with patient and/or surrogate as well as nursing, discussions with consultants, evaluation of patient's response to treatment, examination of patient, obtaining history from patient or surrogate, ordering and performing treatments and interventions, ordering and review of laboratory studies, ordering and review of radiographic studies, pulse oximetry and re-evaluation of patient's condition.  Medications Ordered in ED Medications - No data to display   Initial Impression / Assessment and Plan / ED Course  I have reviewed the triage vital signs and the nursing notes.  Pertinent labs & imaging results that were available during my care of the patient were reviewed by me and considered in my medical decision making (see chart for details).          Final Clinical Impressions(s) / ED Diagnoses MDM  Patient's pulse oximetry was 81% on 2 L of oxygen.  Patient was placed on 3 L of oxygen with improvement of the pulse oximetry between 93 and 95%.  The temperature was normal at 97.9.  The heart rate  was elevated at 116.  The patient has been checked at various intervals throughout his emergency department visit and noted to have some tachypnea of 21 to 24 breaths/min.  Recheck. PUlse ox followed closely. Curently 92-94% on O2  A lactic acid was obtained  and found to be normal at 1.8.  Troponin was elevated at 21, however a second troponin was also 21 and this is in the range the patient has been noted to have elevated troponin recently.  A COVID-19 test was obtained and found to be negative.  PT/INR obtained.  The PT is 32.9 seconds, the INR is 3.3.  The patient states his stated goal is 2.5-3.  The comprehensive metabolic panel is nonacute.  The anion gap is normal at 12. The complete blood count shows white blood cells to be normal at 8100.  The hemoglobin is slightly low at 11.6, and the hematocrit is slightly low at 34.4.  The platelets are low at 127,000.  There is no shift to the left..  The B natruretic peptide is improved from his previous reading.  The B natruretic peptide is 102 today.  Venous ultrasound shows no evidence of deep vein thrombus on the right. At the time of the test, the patient is afebrile, the complete blood count is not significantly elevated, the lactic acid is also normal.  The area of redness is warm to touch, and the patient states that it is painful when he applies weight.  He does not recall injuring the area.  Will consider possible cellulitis.  Chest x-ray shows interval worsening of the right greater than the left airspace disease.  There was questioning of worsening pneumonia present.  Cardiomegaly was noted with vascular congestion.  There was also some cardiomegaly with vascular congestion which raise the suspicion of underlying interstitial edema.  I discussed with the patient the possibility of admission.  The patient states that he has oxygen at home.  His COVID test was negative.  He would like to try antibiotics at home for his pneumonia, as well as for his possible cellulitis of the lower extremity.  The patient will be reevaluated either by his primary physician or he will return to the emergency department on September 7.  Patient will return to the emergency department sooner if any high fever,  nausea/vomiting, more shortness of breath, worsening of the pain in the leg, changes in his condition, problems, or concerns.   Final diagnoses:  Community acquired pneumonia, unspecified laterality  Right leg pain    ED Discharge Orders    None       Lily Kocher, PA-C 08/01/19 [place], [place], DO 08/03/19 1719

## 2019-07-31 NOTE — Telephone Encounter (Signed)
Patient called extremely short of breath. Patient states he was diagnosed with COVID about 23 days ago but has been feeling well. He reports that he has not had his coumadin checked since his COVID diagnosis and reports intense nose bleeds every morning from both nostril. Patient also described a hot and extremely painful feeling on the back of his calf.   I advised patient to hang up and have someone take him to ER immediately. Patient confirmed he has someone who can drive him. He asked if Urgent Care would be ok option, I advised him NO he needs to go to closest ER. Patient agreeable and hung up abruptly.   FYI to PCP

## 2019-08-01 LAB — NOVEL CORONAVIRUS, NAA: SARS-CoV-2, NAA: NOT DETECTED

## 2019-08-06 ENCOUNTER — Ambulatory Visit (INDEPENDENT_AMBULATORY_CARE_PROVIDER_SITE_OTHER): Payer: BC Managed Care – PPO | Admitting: Osteopathic Medicine

## 2019-08-06 ENCOUNTER — Encounter: Payer: Self-pay | Admitting: Osteopathic Medicine

## 2019-08-06 ENCOUNTER — Other Ambulatory Visit: Payer: Self-pay

## 2019-08-06 ENCOUNTER — Ambulatory Visit (INDEPENDENT_AMBULATORY_CARE_PROVIDER_SITE_OTHER): Payer: BC Managed Care – PPO

## 2019-08-06 VITALS — BP 111/60 | HR 86 | Temp 97.8°F | Wt 284.7 lb

## 2019-08-06 DIAGNOSIS — R0602 Shortness of breath: Secondary | ICD-10-CM | POA: Diagnosis not present

## 2019-08-06 DIAGNOSIS — J449 Chronic obstructive pulmonary disease, unspecified: Secondary | ICD-10-CM

## 2019-08-06 MED ORDER — METHYLPREDNISOLONE SODIUM SUCC 125 MG IJ SOLR
125.0000 mg | Freq: Once | INTRAMUSCULAR | Status: AC
  Administered 2019-08-06: 15:00:00 125 mg via INTRAMUSCULAR

## 2019-08-06 MED ORDER — PREDNISONE 20 MG PO TABS: 20.0000 mg | ORAL_TABLET | Freq: Two times a day (BID) | ORAL | 0 refills | Status: DC

## 2019-08-06 NOTE — Patient Instructions (Signed)
Will get labs Steroid shot today and I sent pills for steroid burst into pharmacy

## 2019-08-06 NOTE — Progress Notes (Signed)
HPI: Jeffrey Larson is a 63 y.o. male who  has a past medical history of Chronic combined systolic and diastolic congestive heart failure ([place]), COPD with chronic bronchitis ([place]), Coronary artery disease, Essential hypertension, benign, History of pneumonia, Hyperlipidemia, Longstanding persistent atrial fibrillation, Mitral stenosis with regurgitation, Myocardial infarction [facility] Pulmonary hypertension ([place]), Renal disorder, S/P Maze operation for atrial fibrillation (07/18/2018), S/P mitral valve replacement with metallic valve (0000000), and S/P TVR (tricuspid valve repair) (07/18/2018).  he presents to [facility] today, 08/06/19,  for chief complaint of: Hospital follow-up   Was in hospital last month for COVID pneumonia, recovered nicely from this, states that he was feeling great after his discharge for a couple of days but then came down with worsening cough/shortness of breath.  Recent ER evaluation showed pneumonia.  Patient presents today as instructed by ER for routine follow-up.  He reports significant shortness of breath but he feels fine on oxygen.  No lower extremity swelling, no chest pain.  Patient at the this point is on 5 L of oxygen and oxygen saturations are staying at 94%.  Off of oxygen dropping down to 81.    Past medical, surgical, social and family history reviewed:  Patient Active Problem List   Diagnosis Date Noted  . 2019 novel coronavirus disease (COVID-19) 07/08/2019  . COVID-19 virus infection 07/08/2019  . Encounter for therapeutic drug monitoring 08/04/2018  . Pressure injury of skin 07/30/2018  . S/P mitral valve replacement with metallic valve + tricuspid valve repair + maze procedure 07/18/2018  . S/P TVR (tricuspid valve repair) 07/18/2018  . S/P Maze operation for atrial fibrillation 07/18/2018  . Tricuspid valve insufficiency 07/15/2018  . Persistent atrial fibrillation 07/15/2018  . Morbid obesity ([place])   .  Chronic combined systolic and diastolic congestive heart failure ([place])   . Pulmonary hypertension ([place])   . Cold sore 10/28/2017  . Neck pain 10/01/2017  . Coronary artery disease   . Controlled type 2 diabetes mellitus without complication, without long-term current use of insulin ([place]) 08/02/2017  . Cardiac disease 04/18/2017  . Longstanding persistent atrial fibrillation   . Acute respiratory failure with hypoxia ([place])   . Chronic combined systolic and diastolic heart failure ([place]) 11/12/2016  . Right shoulder pain 07/09/2016  . Bilateral low back pain without sciatica 04/30/2016  . Hematuria, microscopic 04/30/2016  . COPD with chronic bronchitis ([place]) 09/08/2014  . Mitral stenosis with regurgitation   . History of MI (myocardial infarction) 08/04/2014  . Hyperlipidemia 08/04/2014  . Essential hypertension, benign 08/04/2014    Past Surgical History:  Procedure Laterality Date  . APPENDECTOMY    . CARDIOVASCULAR STRESS TEST  03/2014   Borderline reversible ischemic changes at the apex.  Normal LV contractility/EF 52%.  . CORONARY STENT PLACEMENT  7/12013  . LEFT HEART CATHETERIZATION WITH CORONARY ANGIOGRAM N/A 06/12/2012   Procedure: LEFT HEART CATHETERIZATION WITH CORONARY ANGIOGRAM;  Surgeon: Clent Demark, MD;  [facility] C[facility] LAB;  Service: Cardiovascular;  Laterality: N/A;  . MAZE N/A 07/18/2018   Procedure: MAZE;  Surgeon: Rexene Alberts, MD;  Location: [place];  Service: Open Heart Surgery;  Laterality: N/A;  . MITRAL VALVE REPLACEMENT N/A 07/18/2018   Procedure: MITRAL VALVE (MV) REPLACEMENT WITH CARBOMEDICS OPTIFORM MITRAL VALVE SIZE 33.;  Surgeon: Rexene Alberts, MD;  Location: [place];  Service: Open Heart Surgery;  Laterality: N/A;  . PERCUTANEOUS CORONARY STENT INTERVENTION (PCI-S) N/A 06/17/2012   Procedure: PERCUTANEOUS CORONARY STENT INTERVENTION (  PCI-S);  Surgeon: Clent Demark, MD;  Location: [facility] CATH LAB;  Service: Cardiovascular;  Laterality: N/A;  .  RIGHT/LEFT HEART CATH AND CORONARY ANGIOGRAPHY N/A 06/10/2018   Procedure: RIGHT/LEFT HEART CATH AND CORONARY ANGIOGRAPHY;  Surgeon: Dixie Dials, MD;  Location: [place] CV LAB;  Service: Cardiovascular;  Laterality: N/A;  . TEE WITHOUT CARDIOVERSION N/A 06/10/2018   Procedure: TRANSESOPHAGEAL ECHOCARDIOGRAM (TEE) with bubble study;  Surgeon: Dixie Dials, MD;  Location: [facility] ENDOSCOPY;  Service: Cardiovascular;  Laterality: N/A;  . TEE WITHOUT CARDIOVERSION N/A 07/18/2018   Procedure: TRANSESOPHAGEAL ECHOCARDIOGRAM (TEE);  Surgeon: Rexene Alberts, MD;  Location: [place];  Service: Open Heart Surgery;  Laterality: N/A;  . TRICUSPID VALVE REPLACEMENT N/A 07/18/2018   Procedure: TRICUSPID VALVE REPAIR WITH EDWARDS MC3 TRICUSPID ANNULOPLASTY RING SIZE 30.;  Surgeon: Rexene Alberts, MD;  Location: [place];  Service: Open Heart Surgery;  Laterality: N/A;    Social History   Tobacco Use  . Smoking status: Former Smoker    Packs/day: 2.00    Years: 15.00    Pack years: 30.00    Types: Cigarettes    Quit date: 11/26/1996    Years since quitting: 22.7  . Smokeless tobacco: Never Used  Substance Use Topics  . Alcohol use: No    Family History  Problem Relation Age of Onset  . Heart disease Mother   . Heart disease Father      Current medication list and allergy/intolerance information reviewed:    Current Outpatient Medications  Medication Sig Dispense Refill  . acetaminophen (TYLENOL) 500 MG tablet Take 1,000 mg by mouth 2 (two) times daily.    Marland Kitchen albuterol (PROVENTIL) (5 MG/ML) 0.5% nebulizer solution Take 0.5 mLs (2.5 mg total) by nebulization every 2 (two) hours as needed for wheezing or shortness of breath. 40 mL 99  . albuterol (VENTOLIN HFA) 108 (90 Base) MCG/ACT inhaler Inhale 2 puffs into the lungs 2 (two) times daily. 18 g 99  . amoxicillin-clavulanate (AUGMENTIN) 875-125 MG tablet Take 1 tablet by mouth every 12 (twelve) hours. 14 tablet 0  . aspirin 81 MG tablet Take 81 mg by  mouth daily.    Marland Kitchen atorvastatin (LIPITOR) 40 MG tablet Take 1 tablet (40 mg total) by mouth every evening. 90 tablet 3  . dexamethasone (DECADRON) 4 MG tablet Take 1 tablet (4 mg total) by mouth 2 (two) times daily with a meal. 10 tablet 0  . HYDROcodone-acetaminophen (NORCO/VICODIN) 5-325 MG tablet Take 1 tablet by mouth every 4 (four) hours as needed. 15 tablet 0  . metoprolol succinate (TOPROL-XL) 50 MG 24 hr tablet Take 1 tablet (50 mg total) by mouth daily. Take with or immediately following a meal. 30 tablet 3  . nitroGLYCERIN (NITROSTAT) 0.4 MG SL tablet Place 1 tablet (0.4 mg total) under the tongue every 5 (five) minutes x 3 doses as needed for chest pain. 25 tablet 3  . Omega-3 Fatty Acids (FISH OIL) 1200 MG CAPS Take 1,200 mg by mouth daily.    Marland Kitchen oxymetazoline (AFRIN) 0.05 % nasal spray Place 1 spray into both nostrils daily.    . sacubitril-valsartan (ENTRESTO) 24-26 MG Take 1 tablet by mouth 2 (two) times daily. 180 tablet 1  . sildenafil (REVATIO) 20 MG tablet Take 1 tablet (20 mg total) by mouth 3 (three) times daily. 270 tablet 3  . spironolactone (ALDACTONE) 25 MG tablet TAKE 1 TABLET(25 MG) BY MOUTH DAILY (Patient taking differently: Take 25 mg by mouth daily. ) 30 tablet  6  . torsemide (DEMADEX) 20 MG tablet Take 2 tablets (40 mg total) by mouth daily. 60 tablet 11  . TRELEGY ELLIPTA 100-62.5-25 MCG/INH AEPB INHALE 1 PUFF INTO THE LUNGS DAILY 60 each 0  . warfarin (COUMADIN) 4 MG tablet Take 1 tablet daily except 1 1/2 tablets on Mondays and Thursdays or as directed (Patient taking differently: Take 4-6 mg by mouth See admin instructions. 6 mg on Monday and 6 mg on Thursday. 4 mg every other day) 40 tablet 4   No current facility-administered medications for this visit.     Allergies  Allergen Reactions  . Ramipril Cough    cough  . Percocet [Oxycodone-Acetaminophen]     In ICU it contributed to hallucinations. Can take by itself      Review of Systems:  Constitutional:   No  fever, no chills, +recent illness, No unintentional weight changes. +significant fatigue.   HEENT: No  headache, no vision change  Cardiac: No  chest pain, No  pressure, No palpitations, No  Orthopnea  Respiratory:  +shortness of breath. +Cough  Gastrointestinal: No  abdominal pain, No  nausea  Musculoskeletal: No new myalgia/arthralgia  Skin: No  Rash  Neurologic: No  weakness, No  dizziness   Exam:  BP 111/60 (BP Location: Left Arm, Patient Position: Sitting, Cuff Size: Normal)   Pulse 86   Temp 97.8 F (36.6 C) (Oral)   Wt 284 lb 11.2 oz (129.1 kg)   BMI 39.71 kg/m   Constitutional: VS see above. General Appearance: alert, well-developed, well-nourished, mild respiratory distress when off O2  Eyes: Normal lids and conjunctive, non-icteric sclera  Ears, Nose, Mouth, Throat: MMM, Normal external inspection ears/nares/mouth/lips/gums. TM normal bilaterally. Pharynx/tonsils no erythema, no exudate. Nasal mucosa normal.   Neck: No masses, trachea midline. No thyroid enlargement. No tenderness/mass appreciated. No lymphadenopathy  Respiratory: Normal respiratory effort. +diffuse wheeze, no rhonchi, no rales.  Diminished breath sounds bilaterally  Cardiovascular: S1/S2 normal, no appreciable murmur, no rub/gallop auscultated. RRR. No lower extremity edema.  Artificial valve clicking  Musculoskeletal: Gait normal.   Neurological: Normal balance/coordination. No tremor.  intact and symmetric. Cerebellar reflexes intact.   Skin: warm, dry, intact.   Psychiatric: Normal judgment/insight. Normal mood and affect. Oriented x3.    No results found for this or any previous visit (from the past 72 hour(s)).  Dg Chest 2 View  Result Date: 08/06/2019 CLINICAL DATA:  Follow-up pneumonia, shortness of breath EXAM: CHEST - 2 VIEW COMPARISON:  07/31/2019 FINDINGS: Cardiac shadow is again enlarged. Postsurgical changes are again seen. Patchy fibrotic changes are noted throughout  both lungs improved when compare with the prior exam likely representing baseline abnormality. No new focal infiltrate is seen. IMPRESSION: Improved aeration when compare with the prior exam. Chronic fibrotic changes are again noted in the bases. Electronically Signed   By: Inez Catalina M.D.   On: 08/06/2019 13:52         ASSESSMENT/PLAN: The primary encounter diagnosis was Shortness of breath. A diagnosis of COPD with chronic bronchitis ([place]) was also pertinent to this visit.   X-ray images personally reviewed.  Definitely looks better than recent ones from the emergency room.  Patient's shortness of breath off oxygen is definitely concerning.  No signs of clinical heart failure, have to assume COPD exacerbation at this point, will trial steroids, it has been quite sometime since he has seen pulmonology, given the number of exacerbations that he is having, and his oxygen requirement at this point, I think  getting a specialist on board would be prudent.  Patient was previously seen by Dr. Vaughan Browner, last office visit on file 02/2018.  It looks like they her office had tried to reach out to schedule a follow-up but patient did not answer or return calls.  I messaged him as FYI   Orders Placed This Encounter  Procedures  . DG Chest 2 View  . COMPLETE METABOLIC PANEL WITH GFR  . CBC with Differential/Platelet  . B Nat Peptide    Meds ordered this encounter  Medications  . predniSONE (DELTASONE) 20 MG tablet    Sig: Take 1 tablet (20 mg total) by mouth 2 (two) times daily with a meal.    Dispense:  10 tablet    Refill:  0  . methylPREDNISolone sodium succinate (SOLU-MEDROL) 125 mg/2 mL injection 125 mg    Patient Instructions  Will get labs Steroid shot today and I sent pills for steroid burst into pharmacy         Visit summary with medication list and pertinent instructions was printed for patient to review. All questions at time of visit were answered - patient instructed to  contact office with any additional concerns or updates. ER/RTC precautions were reviewed with the patient.     Please note: voice recognition software was used to produce this document, and typos may escape review. Please contact Dr. Sheppard Coil for any needed clarifications.     Follow-up plan: Return for RECHECK PENDING RESULTS / IF WORSE OR CHANGE.

## 2019-08-07 ENCOUNTER — Other Ambulatory Visit: Payer: Self-pay

## 2019-08-07 ENCOUNTER — Ambulatory Visit (INDEPENDENT_AMBULATORY_CARE_PROVIDER_SITE_OTHER): Payer: BC Managed Care – PPO | Admitting: *Deleted

## 2019-08-07 DIAGNOSIS — Z5181 Encounter for therapeutic drug level monitoring: Secondary | ICD-10-CM

## 2019-08-07 DIAGNOSIS — I4891 Unspecified atrial fibrillation: Secondary | ICD-10-CM

## 2019-08-07 DIAGNOSIS — Z954 Presence of other heart-valve replacement: Secondary | ICD-10-CM

## 2019-08-07 LAB — COMPLETE METABOLIC PANEL WITH GFR
AG Ratio: 1.4 (calc) (ref 1.0–2.5)
ALT: 50 U/L — ABNORMAL HIGH (ref 9–46)
AST: 22 U/L (ref 10–35)
Albumin: 3.7 g/dL (ref 3.6–5.1)
Alkaline phosphatase (APISO): 52 U/L (ref 35–144)
BUN/Creatinine Ratio: 32 (calc) — ABNORMAL HIGH (ref 6–22)
BUN: 34 mg/dL — ABNORMAL HIGH (ref 7–25)
CO2: 26 mmol/L (ref 20–32)
Calcium: 9.2 mg/dL (ref 8.6–10.3)
Chloride: 101 mmol/L (ref 98–110)
Creat: 1.07 mg/dL (ref 0.70–1.25)
GFR, Est African American: 86 mL/min/{1.73_m2} (ref >=60)
GFR, Est Non African American: 74 mL/min/{1.73_m2} (ref >=60)
Globulin: 2.6 g/dL (calc) (ref 1.9–3.7)
Glucose, Bld: 78 mg/dL (ref 65–99)
Potassium: 4.2 mmol/L (ref 3.5–5.3)
Sodium: 140 mmol/L (ref 135–146)
Total Bilirubin: 1 mg/dL (ref 0.2–1.2)
Total Protein: 6.3 g/dL (ref 6.1–8.1)

## 2019-08-07 LAB — BRAIN NATRIURETIC PEPTIDE: Brain Natriuretic Peptide: 153 pg/mL — ABNORMAL HIGH (ref <100)

## 2019-08-07 LAB — CBC WITH DIFFERENTIAL/PLATELET
Absolute Monocytes: 1117 cells/uL — ABNORMAL HIGH (ref 200–950)
Basophils Absolute: 22 cells/uL (ref 0–200)
Basophils Relative: 0.1 %
Eosinophils Absolute: 175 cells/uL (ref 15–500)
Eosinophils Relative: 0.8 %
HCT: 39.3 % (ref 38.5–50.0)
Hemoglobin: 12.9 g/dL — ABNORMAL LOW (ref 13.2–17.1)
Lymphs Abs: 2234 cells/uL (ref 850–3900)
MCH: 29.4 pg (ref 27.0–33.0)
MCHC: 32.8 g/dL (ref 32.0–36.0)
MCV: 89.5 fL (ref 80.0–100.0)
MPV: 9.3 fL (ref 7.5–12.5)
Monocytes Relative: 5.1 %
Neutro Abs: 18352 cells/uL — ABNORMAL HIGH (ref 1500–7800)
Neutrophils Relative %: 83.8 %
Platelets: 519 10*3/uL — ABNORMAL HIGH (ref 140–400)
RBC: 4.39 10*6/uL (ref 4.20–5.80)
RDW: 16 % — ABNORMAL HIGH (ref 11.0–15.0)
Total Lymphocyte: 10.2 %
WBC: 21.9 10*3/uL — ABNORMAL HIGH (ref 3.8–10.8)

## 2019-08-07 LAB — POCT INR: INR: 4.4 — AB (ref 2.0–3.0)

## 2019-08-07 NOTE — Patient Instructions (Signed)
Hold coumadin tonight and tomorrow night then decrease dose to 2 tablets daily Recheck in 1 wk  On prednisone x 5 days

## 2019-08-18 ENCOUNTER — Other Ambulatory Visit: Payer: Self-pay

## 2019-08-18 ENCOUNTER — Ambulatory Visit (INDEPENDENT_AMBULATORY_CARE_PROVIDER_SITE_OTHER): Payer: BC Managed Care – PPO | Admitting: *Deleted

## 2019-08-18 DIAGNOSIS — Z5181 Encounter for therapeutic drug level monitoring: Secondary | ICD-10-CM | POA: Diagnosis not present

## 2019-08-18 DIAGNOSIS — Z954 Presence of other heart-valve replacement: Secondary | ICD-10-CM | POA: Diagnosis not present

## 2019-08-18 DIAGNOSIS — U071 COVID-19: Secondary | ICD-10-CM | POA: Diagnosis not present

## 2019-08-18 DIAGNOSIS — I4891 Unspecified atrial fibrillation: Secondary | ICD-10-CM | POA: Diagnosis not present

## 2019-08-18 LAB — POCT INR: INR: 5.4 — AB (ref 2.0–3.0)

## 2019-08-18 NOTE — Patient Instructions (Signed)
Hold coumadin tonight and tomorrow night then decrease dose to 4mg  daily Recheck in 1 wk   Finished prednisone

## 2019-08-20 ENCOUNTER — Ambulatory Visit (INDEPENDENT_AMBULATORY_CARE_PROVIDER_SITE_OTHER): Payer: BC Managed Care – PPO | Admitting: Family Medicine

## 2019-08-20 ENCOUNTER — Encounter: Payer: Self-pay | Admitting: Family Medicine

## 2019-08-20 ENCOUNTER — Ambulatory Visit (INDEPENDENT_AMBULATORY_CARE_PROVIDER_SITE_OTHER): Payer: BC Managed Care – PPO

## 2019-08-20 ENCOUNTER — Other Ambulatory Visit: Payer: Self-pay

## 2019-08-20 VITALS — BP 118/69 | HR 99 | Temp 98.4°F | Wt 285.0 lb

## 2019-08-20 DIAGNOSIS — J449 Chronic obstructive pulmonary disease, unspecified: Secondary | ICD-10-CM

## 2019-08-20 DIAGNOSIS — I4811 Longstanding persistent atrial fibrillation: Secondary | ICD-10-CM

## 2019-08-20 DIAGNOSIS — I5042 Chronic combined systolic (congestive) and diastolic (congestive) heart failure: Secondary | ICD-10-CM | POA: Diagnosis not present

## 2019-08-20 DIAGNOSIS — R0602 Shortness of breath: Secondary | ICD-10-CM

## 2019-08-20 DIAGNOSIS — J9601 Acute respiratory failure with hypoxia: Secondary | ICD-10-CM

## 2019-08-20 DIAGNOSIS — I272 Pulmonary hypertension, unspecified: Secondary | ICD-10-CM

## 2019-08-20 DIAGNOSIS — Z954 Presence of other heart-valve replacement: Secondary | ICD-10-CM

## 2019-08-20 MED ORDER — SPIRONOLACTONE 25 MG PO TABS: ORAL_TABLET | ORAL | 6 refills | Status: DC

## 2019-08-20 NOTE — Patient Instructions (Signed)
Thank you for coming in today. Continue oxygen as needed especially with activity.  Restart spirolactone Continue current meds.  Get labs and xray today.  Recheck with cardiology and Dr Sheppard Coil as scheduled.  Recheck sooner if needed.  Call or go to the emergency room if you get worse, have trouble breathing, have chest pains, or palpitations.    Pulmonary Edema Pulmonary edema is a condition in which fluid collects in the air sacs of the lung. This makes it hard for the lungs to fill with air. It also prevents the lungs from moving oxygen into the bloodstream, which can affect other organs, such as the brain and kidneys. Pulmonary edema is an emergency and should be treated immediately. There are two main types of pulmonary edema:  Cardiogenic. This means the pulmonary edema was caused by a problem with the heart.  Noncardiogenic. This means the pulmonary edema was caused by something other than the heart, such as an injury to the lung. What are the causes? This condition is commonly caused by heart failure. When this happens, the heart is not able to properly pump blood through the body. This can lead to increased pressure in the heart and blood building up in the veins around the lungs. When blood builds up in these veins, fluid gets pushed into the air sacs of the lung. Heart failure may be caused by:  Coronary artery disease.  High blood pressure.  Viral infection of the heart (myocarditis).  Leaky or stiff heart valves.  Irregular heartbeat (arrhythmia).  Fluid buildup caused by kidney problems. Other causes include:  Infection in the lungs (pneumonia), blood (sepsis), or other part of the body.  Severe injury to the chest.  Lung injury from heat or toxins, such as breathing in smoke or poisonous gas.  Inhaling vomit or water (pulmonaryaspiration).  Certain medicines.  High altitude. What are the signs or symptoms? Symptoms of this condition include:  Shortness  of breath.  Coughing with frothy or bloody mucus.  Wheezing.  Feeling like you cannot get enough air.  Shallow and fast breathing.  Skin that is cool and damp, and has a pale or bluish color. How is this diagnosed? This condition is diagnosed based on:  Your medical history.  A physical exam.  Your symptoms. You may also have other tests, including:  Chest X-ray.  Chest CT scan.  Blood tests, including checking the amount of oxygen in the blood.  Sputum culture. This test checks for infection in the mucus that you cough up from your lungs.  Electrocardiogram. This measures the electrical signals of the heart.  Echocardiogram. This uses an ultrasound to evaluate the health of the heart. How is this treated? Initial treatment for this condition focuses on relieving your symptoms. Treatment depends on the underlying cause of the condition. This may include:  Oxygen therapy. The oxygen may be given through tubes in your nose or through a face mask. In severe cases, a breathing tube is inserted into the windpipe and hooked up to a breathing machine (ventilator).  Medicines. These may include medicines to: ? Help the body get rid of extra water (diuretics). ? Help the heart pump blood properly. ? Prevent or destroy blood clots. If poor heart function is the cause, treatment may also include:  Procedures to open blocked arteries, repair damaged heart valves, or remove some of the damaged heart muscle.  A pacemaker to help with heart function.  A procedure that uses electric shocks to regulate heart rate (  cardioversion). If an infection is the cause, treatment may include antibiotic medicines. Follow these instructions at home: Medicines  Take over-the-counter and prescription medicines only as told by your health care provider.  If you were prescribed an antibiotic, take it as told by your health care provider. Do not stop taking the antibiotic even if you start to feel  better.  Have a plan with information about each medicine you take. This should include: ? Why you take the medicine. ? Possible side effects. ? Best time of day to take it. ? Foods to take with it, or foods to avoid when taking it. ? When to call your health care provider.  Make a list of each medicine, vitamin, or herbal supplement you take. Keep the list with you at all times. Show it to your health care provider at each visit and before starting a new medicine. Update the list as you add or stop medicines. Lifestyle   Exercise regularly as told by your health care provider. It is important to do it safely. You can do this by: ? Pacing your activities to avoid shortness of breath or chest pain. ? Resting for at least 1 hour before and after meals. ? Asking about cardiac rehabilitation programs. These may include education, exercise plans, and counseling.  Eat a heart-healthy diet that is low in salt (sodium), saturated fat, and cholesterol. Your health care provider may recommend foods that are high in fiber, such as fresh fruits and vegetables, whole grains, and beans.  Do not use any products that contain nicotine or tobacco, such as cigarettes and e-cigarettes. If you need help quitting, ask your health care provider. General instructions  Maintain a healthy weight.  Keep a record of your weight: ? Record your hospital or clinic weight. When you get home, compare it to your scale and record your weight. ? Weigh yourself first thing each morning after you urinate and before you eat breakfast. Wear the same amount of clothing each time. Record the weights. ? Share your weight record with your health care provider. Daily weights are important in detecting the body's retention of excess fluid. ? Tell your health care provider right away if you gain weight quickly. Your medicines may need to be adjusted.  Check and record your blood pressure as often as told by your health care  provider. Bring the records with you to clinic visits.  Consider therapy or joining a support group. This may help with any stress, fear, or anxiety.  Keep all follow-up visits as told by your health care provider. This is important. Get help right away if:  You gain weight quickly.  You have severe chest pain, especially if the pain is crushing or pressure-like and spreads to the arms, back, neck, or jaw.  You have more swelling in your hands, feet, ankles, or abdomen.  You have nausea.  You have unusual sweating or your skin turns blue or pale.  Your shortness of breath gets worse.  You have dizziness, blurred vision, a headache, or unsteadiness.  Your blood pressure is higher than 180/120.  You cough up bloody mucus (sputum).  You cannot sleep because it is hard to breathe.  You feel a racing heart beat (palpitations).  You have anxiety or a feeling that you cannot get enough air. These symptoms may represent a serious problem that is an emergency. Do not wait to see if the symptoms will go away. Get medical help right away. Call your local emergency services (  911 in the [place] Do not drive yourself to the hospital. Summary  Pulmonary edema is a condition in which fluid collects in the air sacs of your lungs. If left untreated, it can lead to a medical emergency.  This condition is most commonly caused by heart failure. Other causes can include infections or injury to the lungs.  Take over-the-counter and prescription medicines only as told by your health care provider. This information is not intended to replace advice given to you by your health care provider. Make sure you discuss any questions you have with your health care provider. Document Released: 02/02/2003 Document Revised: 10/25/2017 Document Reviewed: 01/23/2017 Elsevier Patient Education  2020 [employer].

## 2019-08-20 NOTE — Progress Notes (Signed)
Jeffrey Larson is a 63 y.o. male who presents to [facility]: [facility] today for worsening SOB. Patient was seen in clinic two weeks ago for SOB and follow up after hospital stay for COVID pneumonia. He was given a 5-day prednisone course which he states was helpful. 2-3 days after he stopped the prednisone, he began having worsening SOB with exertion such as when walking to the bathroom or putting on his clothes. Patient has been weaning himself off of supplemental O2 and only uses it in the shower. Patient reports his sats go to the low 80s during these episodes with associated palpitations and tachycardia to the 120s. Patient has been using all medications as prescribed but was recently taken off of spironolactone due to hyperkalemia. Patient denies fever, cough, chills, leg swelling, leg pain, bruising, sick contacts.   He states his shortness of breath is more consistent with when he had valve stenosis and heart failure then with a COPD exacerbation.  He denies significant wheezing.  He notes the albuterol does not help all that much. ROS as above  Exam:  BP 118/69   Pulse 99   Temp 98.4 F (36.9 C) (Oral)   Wt 285 lb (129.3 kg)   SpO2 94%   BMI 39.75 kg/m  Wt Readings from Last 5 Encounters:  08/20/19 285 lb (129.3 kg)  08/06/19 284 lb 11.2 oz (129.1 kg)  07/31/19 285 lb (129.3 kg)  07/08/19 300 lb (136.1 kg)  04/27/19 (!) 312 lb (141.5 kg)    Gen: Well NAD nontoxic appearing HEENT: EOMI,  MMM Lungs: Normal work of breathing. CTABL. No wheezes, rhonchi, or rales.  Heart: RRR no MRG. Audible click due to mechanical heart valve.  Abd: NABS, Soft. Nondistended, Nontender Exts: Brisk capillary refill, warm and well perfused. No LE edema.   Lab and Radiology Results Results for orders placed or performed in visit on 08/18/19 (from the past 72 hour(s))  POCT INR      Status: Abnormal   Collection Time: 08/18/19  2:46 PM  Result Value Ref Range   INR 5.4 (A) 2.0 - 3.0   No results found.  2 view chest x-ray images obtained today personally and independently reviewed Cardiomegaly and postsurgical changes from valve replacement.  Diffuse patchy fibrotic changes both lung fields.  No focal infiltrate.  No pleural effusion.  Not change compared to chest x-ray dated September 10. Await formal radiology review  Assessment and Plan: 63 y.o. male with worsening SOB. Patient was taken off of spironolactone recently due to hyperkalemia. Will restart spironolactone given possible concern for pulmonary edema. Will also order CXR, BNP, CMP, and repeat PT/INR for further evaluation.  Recheck in about 3 weeks with PCP or myself.  PDMP not reviewed this encounter. Orders Placed This Encounter  Procedures  . DG Chest 2 View    Order Specific Question:   Reason for exam:    Answer:   SOB eval pulmonary edema    Order Specific Question:   Preferred imaging location?    Answer:   [facility]  . COMPLETE METABOLIC PANEL WITH GFR  . B Nat Peptide  . INR/PT   Meds ordered this encounter  Medications  . spironolactone (ALDACTONE) 25 MG tablet    Sig: TAKE 1 TABLET(25 MG) BY MOUTH DAILY    Dispense:  30 tablet    Refill:  6     Historical information moved to improve visibility of  documentation.  Past Medical History:  Diagnosis Date  . Chronic combined systolic and diastolic congestive heart failure ([place])   . COPD with chronic bronchitis ([place])   . Coronary artery disease   . Essential hypertension, benign    Ramipril to losartan Sept 2015   . History of pneumonia    RML on CXR 07/20/14  . Hyperlipidemia   . Longstanding persistent atrial fibrillation   . Mitral stenosis with regurgitation   . Myocardial infarction ([place])   . Pulmonary hypertension ([place])   . Renal disorder    history kidney stone  . S/P Maze operation for atrial fibrillation  07/18/2018   Complete bilateral atrial lesion set using bipolar radiofrequency and cryothermy ablation with clipping of LA appendage  . S/P mitral valve replacement with metallic valve 0000000   Sorin Carbomedics Optiform bileaflet mechanical valve, size 33 mm  . S/P TVR (tricuspid valve repair) 07/18/2018   Edwards mc3 ring annuloplasty, size 30 mm   Past Surgical History:  Procedure Laterality Date  . APPENDECTOMY    . CARDIOVASCULAR STRESS TEST  03/2014   Borderline reversible ischemic changes at the apex.  Normal LV contractility/EF 52%.  . CORONARY STENT PLACEMENT  7/12013  . LEFT HEART CATHETERIZATION WITH CORONARY ANGIOGRAM N/A 06/12/2012   Procedure: LEFT HEART CATHETERIZATION WITH CORONARY ANGIOGRAM;  Surgeon: Clent Demark, MD;  Location: [facility] CATH LAB;  Service: Cardiovascular;  Laterality: N/A;  . MAZE N/A 07/18/2018   Procedure: MAZE;  Surgeon: Rexene Alberts, MD;  Location: [place];  Service: Open Heart Surgery;  Laterality: N/A;  . MITRAL VALVE REPLACEMENT N/A 07/18/2018   Procedure: MITRAL VALVE (MV) REPLACEMENT WITH CARBOMEDICS OPTIFORM MITRAL VALVE SIZE 33.;  Surgeon: Rexene Alberts, MD;  Location: [place];  Service: Open Heart Surgery;  Laterality: N/A;  . PERCUTANEOUS CORONARY STENT INTERVENTION (PCI-S) N/A 06/17/2012   Procedure: PERCUTANEOUS CORONARY STENT INTERVENTION (PCI-S);  Surgeon: Clent Demark, MD;  Location: [facility] CATH LAB;  Service: Cardiovascular;  Laterality: N/A;  . RIGHT/LEFT HEART CATH AND CORONARY ANGIOGRAPHY N/A 06/10/2018   Procedure: RIGHT/LEFT HEART CATH AND CORONARY ANGIOGRAPHY;  Surgeon: Dixie Dials, MD;  Location: [place] CV LAB;  Service: Cardiovascular;  Laterality: N/A;  . TEE WITHOUT CARDIOVERSION N/A 06/10/2018   Procedure: TRANSESOPHAGEAL ECHOCARDIOGRAM (TEE) with bubble study;  Surgeon: Dixie Dials, MD;  Location: [facility] ENDOSCOPY;  Service: Cardiovascular;  Laterality: N/A;  . TEE WITHOUT CARDIOVERSION N/A 07/18/2018   Procedure:  TRANSESOPHAGEAL ECHOCARDIOGRAM (TEE);  Surgeon: Rexene Alberts, MD;  Location: [place];  Service: Open Heart Surgery;  Laterality: N/A;  . TRICUSPID VALVE REPLACEMENT N/A 07/18/2018   Procedure: TRICUSPID VALVE REPAIR WITH EDWARDS MC3 TRICUSPID ANNULOPLASTY RING SIZE 30.;  Surgeon: Rexene Alberts, MD;  Location: [place];  Service: Open Heart Surgery;  Laterality: N/A;   Social History   Tobacco Use  . Smoking status: Former Smoker    Packs/day: 2.00    Years: 15.00    Pack years: 30.00    Types: Cigarettes    Quit date: 11/26/1996    Years since quitting: 22.7  . Smokeless tobacco: Never Used  Substance Use Topics  . Alcohol use: No   family history includes Heart disease in his father and mother.  Medications: Current Outpatient Medications  Medication Sig Dispense Refill  . acetaminophen (TYLENOL) 500 MG tablet Take 1,000 mg by mouth 2 (two) times daily.    Marland Kitchen albuterol (PROVENTIL) (5 MG/ML) 0.5% nebulizer solution Take 0.5 mLs (2.5 mg total)  by nebulization every 2 (two) hours as needed for wheezing or shortness of breath. 40 mL 99  . albuterol (VENTOLIN HFA) 108 (90 Base) MCG/ACT inhaler Inhale 2 puffs into the lungs 2 (two) times daily. 18 g 99  . amoxicillin-clavulanate (AUGMENTIN) 875-125 MG tablet Take 1 tablet by mouth every 12 (twelve) hours. 14 tablet 0  . aspirin 81 MG tablet Take 81 mg by mouth daily.    Marland Kitchen atorvastatin (LIPITOR) 40 MG tablet Take 1 tablet (40 mg total) by mouth every evening. 90 tablet 3  . dexamethasone (DECADRON) 4 MG tablet Take 1 tablet (4 mg total) by mouth 2 (two) times daily with a meal. 10 tablet 0  . HYDROcodone-acetaminophen (NORCO/VICODIN) 5-325 MG tablet Take 1 tablet by mouth every 4 (four) hours as needed. 15 tablet 0  . metoprolol succinate (TOPROL-XL) 50 MG 24 hr tablet Take 1 tablet (50 mg total) by mouth daily. Take with or immediately following a meal. 30 tablet 3  . nitroGLYCERIN (NITROSTAT) 0.4 MG SL tablet Place 1 tablet (0.4 mg total)  under the tongue every 5 (five) minutes x 3 doses as needed for chest pain. 25 tablet 3  . Omega-3 Fatty Acids (FISH OIL) 1200 MG CAPS Take 1,200 mg by mouth daily.    Marland Kitchen oxymetazoline (AFRIN) 0.05 % nasal spray Place 1 spray into both nostrils daily.    . predniSONE (DELTASONE) 20 MG tablet Take 1 tablet (20 mg total) by mouth 2 (two) times daily with a meal. 10 tablet 0  . sacubitril-valsartan (ENTRESTO) 24-26 MG Take 1 tablet by mouth 2 (two) times daily. 180 tablet 1  . sildenafil (REVATIO) 20 MG tablet Take 1 tablet (20 mg total) by mouth 3 (three) times daily. 270 tablet 3  . spironolactone (ALDACTONE) 25 MG tablet TAKE 1 TABLET(25 MG) BY MOUTH DAILY (Patient taking differently: Take 25 mg by mouth daily. ) 30 tablet 6  . torsemide (DEMADEX) 20 MG tablet Take 2 tablets (40 mg total) by mouth daily. 60 tablet 11  . TRELEGY ELLIPTA 100-62.5-25 MCG/INH AEPB INHALE 1 PUFF INTO THE LUNGS DAILY 60 each 0  . warfarin (COUMADIN) 4 MG tablet Take 1 tablet daily except 1 1/2 tablets on Mondays and Thursdays or as directed (Patient taking differently: Take 4-6 mg by mouth See admin instructions. 6 mg on Monday and 6 mg on Thursday. 4 mg every other day) 40 tablet 4   No current facility-administered medications for this visit.    Allergies  Allergen Reactions  . Ramipril Cough    cough  . Percocet [Oxycodone-Acetaminophen]     In ICU it contributed to hallucinations. Can take by itself     Discussed warning signs or symptoms. Please see discharge instructions. Patient expresses understanding.  I personally was present and performed or re-performed the history, physical exam and medical decision-making activities of this service and have verified that the service and findings are accurately documented in the student's note. ___________________________________________ Lynne Leader M.D., ABFM., CAQSM. Primary Care and Sports Medicine Adjunct Instructor of [employer]

## 2019-08-21 ENCOUNTER — Telehealth: Payer: Self-pay | Admitting: *Deleted

## 2019-08-21 LAB — COMPLETE METABOLIC PANEL WITH GFR
AG Ratio: 1.9 (calc) (ref 1.0–2.5)
ALT: 23 U/L (ref 9–46)
AST: 21 U/L (ref 10–35)
Albumin: 3.9 g/dL (ref 3.6–5.1)
Alkaline phosphatase (APISO): 52 U/L (ref 35–144)
BUN: 13 mg/dL (ref 7–25)
CO2: 28 mmol/L (ref 20–32)
Calcium: 8.7 mg/dL (ref 8.6–10.3)
Chloride: 100 mmol/L (ref 98–110)
Creat: 0.83 mg/dL (ref 0.70–1.25)
GFR, Est African American: 109 mL/min/{1.73_m2} (ref >=60)
GFR, Est Non African American: 94 mL/min/{1.73_m2} (ref >=60)
Globulin: 2.1 g/dL (calc) (ref 1.9–3.7)
Glucose, Bld: 117 mg/dL — ABNORMAL HIGH (ref 65–99)
Potassium: 4.1 mmol/L (ref 3.5–5.3)
Sodium: 136 mmol/L (ref 135–146)
Total Bilirubin: 1 mg/dL (ref 0.2–1.2)
Total Protein: 6 g/dL — ABNORMAL LOW (ref 6.1–8.1)

## 2019-08-21 LAB — BRAIN NATRIURETIC PEPTIDE: Brain Natriuretic Peptide: 45 pg/mL (ref <100)

## 2019-08-21 LAB — PROTIME-INR
INR: 2.1 — ABNORMAL HIGH
Prothrombin Time: 20.7 s — ABNORMAL HIGH (ref 9.0–11.5)

## 2019-08-21 NOTE — Telephone Encounter (Signed)
Received message from Dr. Sherene Sires who stated that he saw Jeffrey Larson yesterday and his INR was 2.1 and let him know that he instructed pt to continue taking his normal dose of Coumadin. Called pt and LMOM for pt call back to let him know it is okay to continue taking Coumadin 4mg  daily and return to get INR checked on 08/27/2019

## 2019-08-24 ENCOUNTER — Other Ambulatory Visit: Payer: Self-pay

## 2019-08-24 ENCOUNTER — Encounter: Payer: Self-pay | Admitting: Thoracic Surgery (Cardiothoracic Vascular Surgery)

## 2019-08-24 ENCOUNTER — Ambulatory Visit: Payer: BC Managed Care – PPO | Admitting: Thoracic Surgery (Cardiothoracic Vascular Surgery)

## 2019-08-24 VITALS — BP 117/66 | HR 100 | Temp 97.5°F | Resp 20 | Ht 71.0 in | Wt 283.0 lb

## 2019-08-24 DIAGNOSIS — Z8679 Personal history of other diseases of the circulatory system: Secondary | ICD-10-CM | POA: Diagnosis not present

## 2019-08-24 DIAGNOSIS — Z954 Presence of other heart-valve replacement: Secondary | ICD-10-CM | POA: Diagnosis not present

## 2019-08-24 DIAGNOSIS — Z9889 Other specified postprocedural states: Secondary | ICD-10-CM | POA: Diagnosis not present

## 2019-08-24 NOTE — Progress Notes (Signed)
[name]       [name] [address]             [phone]     CARDIOTHORACIC SURGERY OFFICE NOTE  Primary Cardiologist is Kate Sable, MD Advanced Heart Failure Cardiologist is Bensimhon, Shaune Pascal, MD PCP is [name], DO   HPI:  Patient is a 63 year old morbidly obese male with history of coronary artery disease status post acute myocardial infarction in 2013 treated with multivessel PCI and stenting, chronic combined systolic and diastolic congestive heart failure, long-standing persistent atrial fibrillation, and COPD with chronic bronchitis whoreturns to the office today for follow-up 1 year status post mitral valve replacement using a bileaflet mechanical prosthetic valve, tricuspid valve repair, and Maze procedure on July 18, 2018 for rheumatic heart disease with severe symptomatic mitral stenosis, moderate tricuspid regurgitation, and long-standing persistent atrial fibrillation.  He was last seen here in our office on October 20, 2018 at which time he was doing well and maintaining sinus rhythm.  Routine follow-up echocardiogram performed December 01, 2018 revealed bileaflet mechanical prosthetic valve in the mitral position that was functioning normally.  Tricuspid valve repair was intact with trivial residual tricuspid regurgitation.  Left ventricular systolic function was much improved with ejection fraction estimated 55 to 60%.  The patient participated in the cardiac rehab program and completed it in July.  In early August he developed COVID-19 infection and was hospitalized for several weeks at [facility].  He did not require intubation for mechanical ventilation but his recovery was slow.  Since hospital discharge she has been seen in follow-up at his primary care physician's office where chest x-ray revealed mild persistent bilateral patchy airspace disease.  BNP level was not elevated.  The patient apparently has not had any  atrial fibrillation.  Patient returns to our office today and reports that he has doing well and overall he feels "much improved" in comparison with how he felt prior to surgery.  He states that at present his breathing is nowhere near as good as it was before he got COVID-19, but it is slowly improving.  He still gets short of breath and he is using oxygen at home.  He reports intermittent cough.  He is not having fevers.  He has no chest pain or chest tightness with activity.   Current Outpatient Medications  Medication Sig Dispense Refill   acetaminophen (TYLENOL) 500 MG tablet Take 1,000 mg by mouth 2 (two) times daily.     albuterol (PROVENTIL) (5 MG/ML) 0.5% nebulizer solution Take 0.5 mLs (2.5 mg total) by nebulization every 2 (two) hours as needed for wheezing or shortness of breath. 40 mL 99   albuterol (VENTOLIN HFA) 108 (90 Base) MCG/ACT inhaler Inhale 2 puffs into the lungs 2 (two) times daily. 18 g 99   aspirin 81 MG tablet Take 81 mg by mouth daily.     atorvastatin (LIPITOR) 40 MG tablet Take 1 tablet (40 mg total) by mouth every evening. 90 tablet 3   metoprolol succinate (TOPROL-XL) 50 MG 24 hr tablet Take 1 tablet (50 mg total) by mouth daily. Take with or immediately following a meal. 30 tablet 3   nitroGLYCERIN (NITROSTAT) 0.4 MG SL tablet Place 1 tablet (0.4 mg total) under the tongue every 5 (five) minutes x 3 doses as needed for chest pain. 25 tablet 3   Omega-3 Fatty Acids (FISH OIL) 1200 MG CAPS Take 1,200 mg by mouth daily.  sacubitril-valsartan (ENTRESTO) 24-26 MG Take 1 tablet by mouth 2 (two) times daily. 180 tablet 1   sildenafil (REVATIO) 20 MG tablet Take 1 tablet (20 mg total) by mouth 3 (three) times daily. 270 tablet 3   spironolactone (ALDACTONE) 25 MG tablet TAKE 1 TABLET(25 MG) BY MOUTH DAILY 30 tablet 6   torsemide (DEMADEX) 20 MG tablet Take 2 tablets (40 mg total) by mouth daily. 60 tablet 11   TRELEGY ELLIPTA 100-62.5-25 MCG/INH AEPB INHALE  1 PUFF INTO THE LUNGS DAILY 60 each 0   warfarin (COUMADIN) 4 MG tablet Take 1 tablet daily except 1 1/2 tablets on Mondays and Thursdays or as directed (Patient taking differently: Take 4-6 mg by mouth See admin instructions. 6 mg on Monday and 6 mg on Thursday. 4 mg every other day) 40 tablet 4   dexamethasone (DECADRON) 4 MG tablet Take 1 tablet (4 mg total) by mouth 2 (two) times daily with a meal. (Patient not taking: Reported on 08/24/2019) 10 tablet 0   No current facility-administered medications for this visit.       Physical Exam:   BP 117/66    Pulse 100    Temp (!) 97.5 F (36.4 C) (Skin)    Resp 20    Ht 5\' 11"  (1.803 m)    Wt 283 lb (128.4 kg)    BMI 39.47 kg/m   General:  Morbidly obese but otherwise well-appearing  Chest:   Clear to auscultation  CV:   Regular rate and rhythm with mechanical heart sounds  Incisions:  n/a  Abdomen:  Soft nontender  Extremities:  Warm and well-perfused  Diagnostic Tests:  Transthoracic Echocardiography  Patient:    Jeffrey Larson, Jeffrey Larson MR #:       [id number] Study Date: 12/01/2018 Gender:     M Age:        2 Height:     180.3 cm Weight:     123.4 kg BSA:        2.53 m^2 Pt. Status: Room:   ATTENDING    [name], MD  [name], MD  REFERRING    [name], MD  PERFORMING   [facility], Massena Penn  SONOGRAPHER  [name], RCS  cc:  ------------------------------------------------------------------- LV EF: 55% -   60%  ------------------------------------------------------------------- Indications:      Atrial fibrillation - 427.31.  CHF - 428.0.  ------------------------------------------------------------------- History:   PMH:  Acquired from the patient and from the patient&'s chart.  Coronary artery disease.  Chronic obstructive pulmonary disease.  PMH:  S/P TVR (tricuspid valve repair) Edwards mc3 ring annuloplasty, size 30 mm Atrial fibrillation with RVR Mitral stenosis with  regurgitation.  Myocardial infarction.  Risk factors:  Hypertension. Dyslipidemia.  ------------------------------------------------------------------- Study Conclusions  - Left ventricle: The cavity size was normal. Wall thickness was   increased in a pattern of mild LVH. Systolic function was normal.   The estimated ejection fraction was in the range of 55% to 60%. - Aortic valve: Valve area (VTI): 3.01 cm^2. Valve area (Vmax):   2.46 cm^2. Valve area (Vmean): 2.9 cm^2. - Mitral valve: There is a [employer] Optiform bileaflet 33   mm mechanical valve in the MV position. There was no evidence for   stenosis. There was no significant regurgitation. Mean gradient   (D): 4 mm Hg. Valve area by pressure half-time: 2.06 cm^2. Valve   area by continuity equation (using LVOT flow): 1.59 cm^2. - Left atrium: The atrium was  severely dilated. - Right ventricle: The cavity size was moderately dilated. Systolic   function was moderately reduced. - Technically difficult study. Echoconrast was used to Scientist, forensic.  ------------------------------------------------------------------- Labs, prior tests, procedures, and surgery: S/P Maze operation for atrial fibrillation. S/P mitral valve replacement with metallic valve- [employer] Optiform bileaflet mechanical valve, size 33 mm.  ------------------------------------------------------------------- Study data:  Comparison was made to the study of 07/28/2018.  Study status:  Routine.  Procedure:  The patient reported no pain pre or post test. Transthoracic echocardiography. Image quality was fair. The study was technically difficult, as a result of poor sound wave transmission. Intravenous contrast (Definity) was administered. Study completion:  There were no complications. Transthoracic echocardiography.  M-mode, complete 2D, spectral Doppler, and color Doppler.  Birthdate:  Patient birthdate: Aug 02, 1956.  Age:   Patient is 63 yr old.  Sex:  Gender: male. BMI: 38 kg/m^2.  Blood pressure:     112/79  Patient status: Inpatient.  Study date:  Study date: 12/01/2018. Study time: 01:12 PM.  Location:  Echo laboratory.  -------------------------------------------------------------------  ------------------------------------------------------------------- Left ventricle:  The cavity size was normal. Wall thickness was increased in a pattern of mild LVH. Systolic function was normal. The estimated ejection fraction was in the range of 55% to 60%. Cannot assess diastoilc function in setting of MVR. Septal motion consistent with prior cardiac surgery. Images were inadequate for LV wall motion assessment.  ------------------------------------------------------------------- Aortic valve:  Poorly visualized. Uncertain number of leaflets. Doppler:   There was no stenosis.   There was no significant regurgitation.    VTI ratio of LVOT to aortic valve: 0.87. Valve area (VTI): 3.01 cm^2. Indexed valve area (VTI): 1.19 cm^2/m^2. Peak velocity ratio of LVOT to aortic valve: 0.71. Valve area (Vmax): 2.46 cm^2. Indexed valve area (Vmax): 0.97 cm^2/m^2. Mean velocity ratio of LVOT to aortic valve: 0.84. Valve area (Vmean): 2.9 cm^2. Indexed valve area (Vmean): 1.14 cm^2/m^2.    Mean gradient (S): 4 mm Hg. Peak gradient (S): 7 mm Hg.  ------------------------------------------------------------------- Aorta:  Aortic root: The aortic root was normal in size.  ------------------------------------------------------------------- Mitral valve:  There is a [employer] Optiform bileaflet 33 mm mechanical valve in the MV position.  Doppler:   There was no evidence for stenosis.   There was no significant regurgitation. Valve area by pressure half-time: 2.06 cm^2. Indexed valve area by pressure half-time: 0.81 cm^2/m^2. Valve area by continuity equation (using LVOT flow): 1.59 cm^2. Indexed valve area  by continuity equation (using LVOT flow): 0.63 cm^2/m^2.    Mean gradient (D): 4 mm Hg.  ------------------------------------------------------------------- Left atrium:  The atrium was severely dilated.  ------------------------------------------------------------------- Atrial septum:  Poorly visualized. No defect or patent foramen ovale was identified.  ------------------------------------------------------------------- Right ventricle:  The cavity size was moderately dilated. The RV shares with the apex with the LV. Systolic function was moderately reduced.  ------------------------------------------------------------------- Pulmonic valve:   Not well visualized.  Doppler:   There was no evidence for stenosis.   There was no significant regurgitation.   ------------------------------------------------------------------- Tricuspid valve:  Not well visualized.  Doppler:   There was no evidence for stenosis.   There was no significant regurgitation. Mean gradient (D): 2 mm Hg.  ------------------------------------------------------------------- Pulmonary artery:    Systolic pressure could not be accurately estimated.   Inadequate TR jet.  ------------------------------------------------------------------- Right atrium:  Poorly visualized.  ------------------------------------------------------------------- Pericardium:  There was no pericardial effusion.  ------------------------------------------------------------------- Systemic veins: Inferior vena cava: The vessel was normal in size. The respirophasic  diameter changes were in the normal range (>= 50%), consistent with normal central venous pressure.  ------------------------------------------------------------------- Measurements   Left ventricle                            Value          Reference  LV ID, ED, PLAX chordal                   49    mm       43 - 52  LV ID, ES, PLAX chordal                    31.9  mm       23 - 38  LV fx shortening, PLAX chordal            35    %        >=29  LV PW thickness, ED                       11    mm       ---------  IVS/LV PW ratio, ED                       1              <=1.3  Stroke volume, 2D                         74    ml       ---------  Stroke volume/bsa, 2D                     29    ml/m^2   ---------  LV e&', lateral                            7.94  cm/s     ---------  LV e&', medial                             6.85  cm/s     ---------  LV e&', average                            7.4   cm/s     ---------  LV ejection time                          270   ms       ---------    Ventricular septum                        Value          Reference  IVS thickness, ED                         11    mm       ---------    LVOT                                      Value  Reference  LVOT ID, S                                21    mm       ---------  LVOT area                                 3.46  cm^2     ---------  LVOT peak velocity, S                     97.37 cm/s     ---------  LVOT mean velocity, S                     72.4  cm/s     ---------  LVOT VTI, S                               20.35 cm       ---------  Stroke volume (SV), LVOT DP               70.5  ml       ---------  Stroke index (SV/bsa), LVOT DP            27.8  ml/m^2   ---------    Aortic valve                              Value          Reference  Aortic valve peak velocity, S             136.6 cm/s     ---------  Aortic valve mean velocity, S             86.41 cm/s     ---------  Aortic valve VTI, S                       23.43 cm       ---------  Aortic mean gradient, S                   4     mm Hg    ---------  Aortic peak gradient, S                   7     mm Hg    ---------  VTI ratio, LVOT/AV                        0.87           ---------  Aortic valve area, VTI                    3.01  cm^2     ---------  Aortic valve area/bsa, VTI                1.19  cm^2/m^2  ---------  Velocity ratio, peak, LVOT/AV             0.71           ---------  Aortic valve area, peak velocity          2.46  cm^2     ---------  Aortic valve area/bsa, peak               0.97  cm^2/m^2 ---------  velocity  Velocity ratio, mean, LVOT/AV             0.84           ---------  Aortic valve area, mean velocity          2.9   cm^2     ---------  Aortic valve area/bsa, mean               1.14  cm^2/m^2 ---------  velocity    Aorta                                     Value          Reference  Aortic root ID, ED                        38    mm       ---------    Left atrium                               Value          Reference  LA ID, A-P, ES                            32    mm       ---------  LA volume/bsa, ES, 1-p A4C                41    ml/m^2   ---------    Mitral valve                              Value          Reference  Mitral mean velocity, D                   84    cm/s     ---------  Mitral pressure half-time                 107   ms       ---------  Mitral mean gradient, D                   4     mm Hg    ---------  Mitral valve area, PHT, DP                2.06  cm^2     ---------  Mitral valve area/bsa, PHT, DP            0.81  cm^2/m^2 ---------  Mitral valve area, LVOT                   1.59  cm^2     ---------  continuity  Mitral valve area/bsa, LVOT               0.63  cm^2/m^2 ---------  continuity  Mitral annulus VTI, D                     46.9  cm       ---------  Tricuspid valve                           Value          Reference  Tricuspid mean gradient, D                2     mm Hg    ---------  Tricuspid VTI at annulus, D               371.1 mm       ---------    Right ventricle                           Value          Reference  TAPSE                                     10    mm       ---------  RV s&', lateral, S                         6.2   cm/s     ---------  Legend: (L)  and  (H)  mark values outside specified reference  range.  ------------------------------------------------------------------- Prepared and Electronically Authenticated by  Kerry Hough, M.D. 2020-01-06T16:03:35   2 channel telemetry rhythm strip demonstrates what appears to be sinus rhythm   Impression:  Patient is currently recovering from COVID-19 infection.  Prior to that he was doing well, and overall he seems to be doing very well from a cardiac standpoint.  He continues to maintain sinus rhythm.  I have personally reviewed the patient's follow-up echocardiogram performed last January which revealed normal left ventricular systolic function, normal functioning bileaflet mechanical prosthetic valve in the mitral position, and intact tricuspid valve repair  Plan:  We have not recommended any changes in the patient's current medications.  Patient will continue to follow-up regularly with Dr. Bronson Ing and return to our office in the future only should specific problems or questions arise.  I spent in excess of 10 minutes during the conduct of this office consultation and >50% of this time involved direct face-to-face encounter with the patient for counseling and/or coordination of their care.   Valentina Gu. Roxy Manns, MD 08/24/2019 2:53 PM

## 2019-08-24 NOTE — Patient Instructions (Addendum)
Continue all previous medications without any changes at this time  Check your weight on a regular basis and keep a log for your records.  Look for signs of fluid overload such as worsening swelling of your lower legs, increased shortness of breath with activity, and/or a dry nonproductive cough.  Discussed these findings with your cardiologist including whether or not you should adjust your fluid pill dosage (diuretic).   

## 2019-08-27 ENCOUNTER — Other Ambulatory Visit: Payer: Self-pay

## 2019-08-27 ENCOUNTER — Ambulatory Visit (INDEPENDENT_AMBULATORY_CARE_PROVIDER_SITE_OTHER): Payer: BC Managed Care – PPO | Admitting: *Deleted

## 2019-08-27 DIAGNOSIS — Z5181 Encounter for therapeutic drug level monitoring: Secondary | ICD-10-CM | POA: Diagnosis not present

## 2019-08-27 DIAGNOSIS — Z954 Presence of other heart-valve replacement: Secondary | ICD-10-CM | POA: Diagnosis not present

## 2019-08-27 DIAGNOSIS — I4891 Unspecified atrial fibrillation: Secondary | ICD-10-CM | POA: Diagnosis not present

## 2019-08-27 LAB — POCT INR: INR: 3 (ref 2.0–3.0)

## 2019-08-27 NOTE — Patient Instructions (Signed)
Continue warfarin 4mg  daily Recheck in 2 wk

## 2019-08-28 ENCOUNTER — Telehealth: Payer: Self-pay

## 2019-08-28 MED ORDER — AMBULATORY NON FORMULARY MEDICATION: 0 refills | Status: DC

## 2019-08-28 NOTE — Telephone Encounter (Signed)
Pt called requesting a rx for a small portable oxygen concentrator. Pt was informed by cardiologist to increase daily exercise regimen. As per pt, he gets easily winded and exhausted with the large pull around oxygen tank. It makes him rely more on the oxygen tank for SOB. Pt currently uses 5 units of oxygen. Requesting a rx to be sent to [facility] - contact number is [phone]. Pls advise, thanks.

## 2019-08-28 NOTE — Telephone Encounter (Signed)
Task completed. New rx along with last visit summary notes from provider faxed to [place] at [phone]. Contact office is [phone]. Confirmation rec'd.

## 2019-08-28 NOTE — Telephone Encounter (Signed)
rx printed.  I am not sure it has the capacity for 5 liter.

## 2019-09-10 ENCOUNTER — Ambulatory Visit (INDEPENDENT_AMBULATORY_CARE_PROVIDER_SITE_OTHER): Payer: BC Managed Care – PPO | Admitting: Family Medicine

## 2019-09-10 ENCOUNTER — Other Ambulatory Visit: Payer: Self-pay

## 2019-09-10 VITALS — BP 120/70 | HR 76 | Temp 98.2°F | Wt 295.0 lb

## 2019-09-10 DIAGNOSIS — J4489 Other specified chronic obstructive pulmonary disease: Secondary | ICD-10-CM

## 2019-09-10 DIAGNOSIS — Z954 Presence of other heart-valve replacement: Secondary | ICD-10-CM

## 2019-09-10 DIAGNOSIS — J449 Chronic obstructive pulmonary disease, unspecified: Secondary | ICD-10-CM

## 2019-09-10 DIAGNOSIS — I10 Essential (primary) hypertension: Secondary | ICD-10-CM | POA: Diagnosis not present

## 2019-09-10 DIAGNOSIS — Z5181 Encounter for therapeutic drug level monitoring: Secondary | ICD-10-CM | POA: Diagnosis not present

## 2019-09-10 DIAGNOSIS — Z23 Encounter for immunization: Secondary | ICD-10-CM | POA: Diagnosis not present

## 2019-09-10 MED ORDER — AMBULATORY NON FORMULARY MEDICATION: 0 refills | Status: DC

## 2019-09-10 NOTE — Progress Notes (Signed)
Jeffrey Larson is a 63 y.o. male who presents to [place]: [place] today for follow-up breathing.  Patient was seen on September 24 for worsening shortness of breath.  Patient had COVID in mid August and had complication including community-acquired pneumonia.  He had some persistent shortness of breath following his hospitalization.  He been persistently on supplemental oxygen following his hospitalization.  When I saw him on September 24 he was having persistent shortness of breath and wheezing.  His shortness of breath worsened a bit after his prednisone was discontinued.  He felt at that time his shortness of breath is more consistent when he was having heart failure symptoms secondary to valvular stenosis and to a typical COPD exacerbation.  I treated his symptoms by restarting spironolactone and doing x-ray and lab work-up.  Fortunately lab work-up was as expected/normal for him and x-ray showed no significant change from his now existing interstitial density thought to be fibrosis or scarring.   In the interim he notes improving breathing. He has less wheezing and DOE. He does have some SOB especially with exertion. He is trying to get a portable oxygen generator. His PCP wrote for a portable generator but the order needs to be changed to 4L from 5L.   ROS as above:  Exam:  BP 120/70   Pulse 76   Temp 98.2 F (36.8 C) (Oral)   Wt 295 lb (133.8 kg)   BMI 41.14 kg/m  Wt Readings from Last 5 Encounters:  09/10/19 295 lb (133.8 kg)  08/24/19 283 lb (128.4 kg)  08/20/19 285 lb (129.3 kg)  08/06/19 284 lb 11.2 oz (129.1 kg)  07/31/19 285 lb (129.3 kg)    Gen: Well NAD HEENT: EOMI,  MMM Lungs: Normal work of breathing. CTABL no significant wheezing. Heart: Regular rate.  Audible click with heartbeat Abd: NABS, Soft. Nondistended, Nontender Exts: Brisk capillary  refill, warm and well perfused.     Assessment and Plan: 63 y.o. male with  Breathing: Likely was multifactorial.  Patient continues to have shortness of breath with exertion and oxygen requirement with exertion following his COVID infection about 2 months ago.  He has excellent follow-up and care with his pulmonologist and primary care provider.  Plan to continue current regimen.  Will modify portable oxygen generator for 4 L which is more easily available.  Recheck with PCP in 2 months.  Recheck sooner with me if needed.  Anticoagulation: Patient has a artificial heart valve and is currently anticoagulated with warfarin.  He typically receives anticoagulation care with his clinic and has a follow-up appointment scheduled for next Monday.  Since implant drawing labs as below we will go ahead and check INR and send results to his warfarin clinic.  This may save him a trip.  Lastly patient was started on spironolactone about 3 weeks ago.  Will check basic metabolic panel today to reassess kidney function and potassium with this new regimen.  PDMP not reviewed this encounter. Orders Placed This Encounter  Procedures  . Flu Vaccine QUAD 6+ mos PF IM (Fluarix Quad PF)  . BASIC METABOLIC PANEL WITH GFR  . INR/PT   Meds ordered this encounter  Medications  . AMBULATORY NON FORMULARY MEDICATION    Sig: Medication Name: [employer]ogen oxygen concentrator with supplies, Dx: COPD, severe,  4 liters. Fax to [employer].    Dispense:  1 vial    Refill:  0  Historical information moved to improve visibility of documentation.  Past Medical History:  Diagnosis Date  . Chronic combined systolic and diastolic congestive heart failure ([place])   . COPD with chronic bronchitis ([place])   . Coronary artery disease   . Essential hypertension, benign    Ramipril to losartan Sept 2015   . History of pneumonia    RML on CXR 07/20/14  . Hyperlipidemia   . Longstanding persistent atrial fibrillation   .  Mitral stenosis with regurgitation   . Myocardial infarction ([place])   . Pulmonary hypertension ([place])   . Renal disorder    history kidney stone  . S/P Maze operation for atrial fibrillation 07/18/2018   Complete bilateral atrial lesion set using bipolar radiofrequency and cryothermy ablation with clipping of LA appendage  . S/P mitral valve replacement with metallic valve 0000000   Sorin Carbomedics Optiform bileaflet mechanical valve, size 33 mm  . S/P TVR (tricuspid valve repair) 07/18/2018   Edwards mc3 ring annuloplasty, size 30 mm   Past Surgical History:  Procedure Laterality Date  . APPENDECTOMY    . CARDIOVASCULAR STRESS TEST  03/2014   Borderline reversible ischemic changes at the apex.  Normal LV contractility/EF 52%.  . CORONARY STENT PLACEMENT  7/12013  . LEFT HEART CATHETERIZATION WITH CORONARY ANGIOGRAM N/A 06/12/2012   Procedure: LEFT HEART CATHETERIZATION WITH CORONARY ANGIOGRAM;  Surgeon: Clent Demark, MD;  Location: [facility] LAB;  Service: Cardiovascular;  Laterality: N/A;  . MAZE N/A 07/18/2018   Procedure: MAZE;  Surgeon: Rexene Alberts, MD;  Location: [place];  Service: Open Heart Surgery;  Laterality: N/A;  . MITRAL VALVE REPLACEMENT N/A 07/18/2018   Procedure: MITRAL VALVE (MV) REPLACEMENT WITH CARBOMEDICS OPTIFORM MITRAL VALVE SIZE 33.;  Surgeon: Rexene Alberts, MD;  Location: [place];  Service: Open Heart Surgery;  Laterality: N/A;  . PERCUTANEOUS CORONARY STENT INTERVENTION (PCI-S) N/A 06/17/2012   Procedure: PERCUTANEOUS CORONARY STENT INTERVENTION (PCI-S);  Surgeon: Clent Demark, MD;  Location: [facility] CATH LAB;  Service: Cardiovascular;  Laterality: N/A;  . RIGHT/LEFT HEART CATH AND CORONARY ANGIOGRAPHY N/A 06/10/2018   Procedure: RIGHT/LEFT HEART CATH AND CORONARY ANGIOGRAPHY;  Surgeon: Dixie Dials, MD;  Location: [place] CV LAB;  Service: Cardiovascular;  Laterality: N/A;  . TEE WITHOUT CARDIOVERSION N/A 06/10/2018   Procedure: TRANSESOPHAGEAL ECHOCARDIOGRAM  (TEE) with bubble study;  Surgeon: Dixie Dials, MD;  Location: [facility] ENDOSCOPY;  Service: Cardiovascular;  Laterality: N/A;  . TEE WITHOUT CARDIOVERSION N/A 07/18/2018   Procedure: TRANSESOPHAGEAL ECHOCARDIOGRAM (TEE);  Surgeon: Rexene Alberts, MD;  Location: [place];  Service: Open Heart Surgery;  Laterality: N/A;  . TRICUSPID VALVE REPLACEMENT N/A 07/18/2018   Procedure: TRICUSPID VALVE REPAIR WITH EDWARDS MC3 TRICUSPID ANNULOPLASTY RING SIZE 30.;  Surgeon: Rexene Alberts, MD;  Location: [place];  Service: Open Heart Surgery;  Laterality: N/A;   Social History   Tobacco Use  . Smoking status: Former Smoker    Packs/day: 2.00    Years: 15.00    Pack years: 30.00    Types: Cigarettes    Quit date: 11/26/1996    Years since quitting: 22.8  . Smokeless tobacco: Never Used  Substance Use Topics  . Alcohol use: No   family history includes Heart disease in his father and mother.  Medications: Current Outpatient Medications  Medication Sig Dispense Refill  . acetaminophen (TYLENOL) 500 MG tablet Take 1,000 mg by mouth 2 (two) times daily.    Marland Kitchen albuterol (PROVENTIL) (5 MG/ML) 0.5% nebulizer  solution Take 0.5 mLs (2.5 mg total) by nebulization every 2 (two) hours as needed for wheezing or shortness of breath. 40 mL 99  . albuterol (VENTOLIN HFA) 108 (90 Base) MCG/ACT inhaler Inhale 2 puffs into the lungs 2 (two) times daily. 18 g 99  . AMBULATORY NON FORMULARY MEDICATION Medication Name: Inogen oxygen concentrator with supplies, Dx: COPD, severe,  4 liters. Fax to [employer]. 1 vial 0  . aspirin 81 MG tablet Take 81 mg by mouth daily.    Marland Kitchen atorvastatin (LIPITOR) 40 MG tablet Take 1 tablet (40 mg total) by mouth every evening. 90 tablet 3  . dexamethasone (DECADRON) 4 MG tablet Take 1 tablet (4 mg total) by mouth 2 (two) times daily with a meal. (Patient not taking: Reported on 08/24/2019) 10 tablet 0  . metoprolol succinate (TOPROL-XL) 50 MG 24 hr tablet Take 1 tablet (50 mg total) by  mouth daily. Take with or immediately following a meal. 30 tablet 3  . nitroGLYCERIN (NITROSTAT) 0.4 MG SL tablet Place 1 tablet (0.4 mg total) under the tongue every 5 (five) minutes x 3 doses as needed for chest pain. 25 tablet 3  . Omega-3 Fatty Acids (FISH OIL) 1200 MG CAPS Take 1,200 mg by mouth daily.    . sacubitril-valsartan (ENTRESTO) 24-26 MG Take 1 tablet by mouth 2 (two) times daily. 180 tablet 1  . sildenafil (REVATIO) 20 MG tablet Take 1 tablet (20 mg total) by mouth 3 (three) times daily. 270 tablet 3  . spironolactone (ALDACTONE) 25 MG tablet TAKE 1 TABLET(25 MG) BY MOUTH DAILY 30 tablet 6  . torsemide (DEMADEX) 20 MG tablet Take 2 tablets (40 mg total) by mouth daily. 60 tablet 11  . TRELEGY ELLIPTA 100-62.5-25 MCG/INH AEPB INHALE 1 PUFF INTO THE LUNGS DAILY 60 each 0  . warfarin (COUMADIN) 4 MG tablet Take 1 tablet daily except 1 1/2 tablets on Mondays and Thursdays or as directed (Patient taking differently: Take 4-6 mg by mouth See admin instructions. 6 mg on Monday and 6 mg on Thursday. 4 mg every other day) 40 tablet 4   No current facility-administered medications for this visit.    Allergies  Allergen Reactions  . Ramipril Cough    cough  . Percocet [Oxycodone-Acetaminophen]     In ICU it contributed to hallucinations. Can take by itself     Discussed warning signs or symptoms. Please see discharge instructions. Patient expresses understanding.

## 2019-09-10 NOTE — Patient Instructions (Signed)
Thank you for coming in today. Try the new portable oxygen.  Advance activity as tolerated.  Get labs today.  I will check INR and you can talk with Cardiology about your upcomming warfarin clinic visit.

## 2019-09-11 ENCOUNTER — Ambulatory Visit (INDEPENDENT_AMBULATORY_CARE_PROVIDER_SITE_OTHER): Payer: BC Managed Care – PPO | Admitting: Cardiovascular Disease

## 2019-09-11 DIAGNOSIS — I4811 Longstanding persistent atrial fibrillation: Secondary | ICD-10-CM

## 2019-09-11 DIAGNOSIS — Z5181 Encounter for therapeutic drug level monitoring: Secondary | ICD-10-CM

## 2019-09-11 LAB — BASIC METABOLIC PANEL WITH GFR
BUN: 17 mg/dL (ref 7–25)
CO2: 27 mmol/L (ref 20–32)
Calcium: 9.2 mg/dL (ref 8.6–10.3)
Chloride: 104 mmol/L (ref 98–110)
Creat: 0.86 mg/dL (ref 0.70–1.25)
GFR, Est African American: 107 mL/min/{1.73_m2} (ref >=60)
GFR, Est Non African American: 92 mL/min/{1.73_m2} (ref >=60)
Glucose, Bld: 114 mg/dL — ABNORMAL HIGH (ref 65–99)
Potassium: 4.4 mmol/L (ref 3.5–5.3)
Sodium: 140 mmol/L (ref 135–146)

## 2019-09-11 LAB — PROTIME-INR
INR: 2.9 — ABNORMAL HIGH
Prothrombin Time: 28.8 s — ABNORMAL HIGH (ref 9.0–11.5)

## 2019-09-11 NOTE — Patient Instructions (Addendum)
Description   Called and spoke to pt, continue warfarin 4mg  daily,Recheck  INR in 3 wks

## 2019-09-14 ENCOUNTER — Other Ambulatory Visit: Payer: Self-pay | Admitting: Osteopathic Medicine

## 2019-09-17 DIAGNOSIS — U071 COVID-19: Secondary | ICD-10-CM | POA: Diagnosis not present

## 2019-09-18 ENCOUNTER — Other Ambulatory Visit: Payer: Self-pay

## 2019-09-18 MED ORDER — ENTRESTO 24-26 MG PO TABS: 1.0000 | ORAL_TABLET | Freq: Two times a day (BID) | ORAL | 1 refills | Status: DC

## 2019-10-05 ENCOUNTER — Ambulatory Visit (INDEPENDENT_AMBULATORY_CARE_PROVIDER_SITE_OTHER): Payer: BC Managed Care – PPO | Admitting: *Deleted

## 2019-10-05 ENCOUNTER — Other Ambulatory Visit: Payer: Self-pay

## 2019-10-05 DIAGNOSIS — I4891 Unspecified atrial fibrillation: Secondary | ICD-10-CM

## 2019-10-05 DIAGNOSIS — Z5181 Encounter for therapeutic drug level monitoring: Secondary | ICD-10-CM | POA: Diagnosis not present

## 2019-10-05 DIAGNOSIS — Z954 Presence of other heart-valve replacement: Secondary | ICD-10-CM | POA: Diagnosis not present

## 2019-10-05 LAB — POCT INR: INR: 2.7 (ref 2.0–3.0)

## 2019-10-05 NOTE — Patient Instructions (Signed)
Continue warfarin 4mg  daily. Recheck  INR in 4 wks

## 2019-10-14 ENCOUNTER — Telehealth: Payer: Self-pay

## 2019-10-14 NOTE — Telephone Encounter (Signed)
Pt left a vm msg requesting if provider can place an order to the company that supplied his oxygen tank. As per pt, he needs an order to discontinue supplemental oxygen and for equipment to be picked up. Pt needs the order to be faxed to [phone]. If order is not received, patient will continue to be charge for the equipment and service. Pls advise, thanks.

## 2019-10-15 MED ORDER — AMBULATORY NON FORMULARY MEDICATION: 0 refills | Status: DC

## 2019-10-15 NOTE — Telephone Encounter (Signed)
Rx/order printed and placed in fax box!

## 2019-10-16 NOTE — Telephone Encounter (Signed)
Task completed. Order faxed to [phone], confirmation received. Pt has been updated. No other inquiries during call.

## 2019-11-02 ENCOUNTER — Ambulatory Visit (INDEPENDENT_AMBULATORY_CARE_PROVIDER_SITE_OTHER): Payer: BC Managed Care – PPO | Admitting: *Deleted

## 2019-11-02 DIAGNOSIS — Z954 Presence of other heart-valve replacement: Secondary | ICD-10-CM

## 2019-11-02 DIAGNOSIS — I4891 Unspecified atrial fibrillation: Secondary | ICD-10-CM

## 2019-11-02 DIAGNOSIS — Z5181 Encounter for therapeutic drug level monitoring: Secondary | ICD-10-CM

## 2019-11-02 LAB — POCT INR: INR: 3.1 — AB (ref 2.0–3.0)

## 2019-11-02 NOTE — Patient Instructions (Signed)
Continue warfarin 4mg  daily. Recheck  INR in 6 wks

## 2019-11-06 ENCOUNTER — Telehealth: Payer: Self-pay | Admitting: Cardiovascular Disease

## 2019-11-06 NOTE — Telephone Encounter (Signed)

## 2019-11-09 ENCOUNTER — Other Ambulatory Visit: Payer: Self-pay

## 2019-11-09 MED ORDER — TRELEGY ELLIPTA 100-62.5-25 MCG/INH IN AEPB: 1.0000 | INHALATION_SPRAY | Freq: Every day | RESPIRATORY_TRACT | 99 refills | Status: DC

## 2019-11-09 NOTE — Telephone Encounter (Addendum)
[facility] pharmacy requesting med refill for trelegy ellipta inhaler. Unable to send off to pharmacy - needs sig/dose. Pls advise, thanks.

## 2019-11-10 ENCOUNTER — Encounter: Payer: Self-pay | Admitting: Osteopathic Medicine

## 2019-11-10 ENCOUNTER — Other Ambulatory Visit: Payer: Self-pay

## 2019-11-10 ENCOUNTER — Telehealth (INDEPENDENT_AMBULATORY_CARE_PROVIDER_SITE_OTHER): Payer: BC Managed Care – PPO | Admitting: Cardiovascular Disease

## 2019-11-10 ENCOUNTER — Telehealth: Payer: Self-pay

## 2019-11-10 ENCOUNTER — Encounter: Payer: Self-pay | Admitting: Cardiovascular Disease

## 2019-11-10 ENCOUNTER — Ambulatory Visit (INDEPENDENT_AMBULATORY_CARE_PROVIDER_SITE_OTHER): Payer: BC Managed Care – PPO | Admitting: Osteopathic Medicine

## 2019-11-10 VITALS — BP 122/77 | HR 83 | Ht 71.0 in | Wt 317.0 lb

## 2019-11-10 DIAGNOSIS — I25118 Atherosclerotic heart disease of native coronary artery with other forms of angina pectoris: Secondary | ICD-10-CM | POA: Diagnosis not present

## 2019-11-10 DIAGNOSIS — E785 Hyperlipidemia, unspecified: Secondary | ICD-10-CM

## 2019-11-10 DIAGNOSIS — Z954 Presence of other heart-valve replacement: Secondary | ICD-10-CM

## 2019-11-10 DIAGNOSIS — I5032 Chronic diastolic (congestive) heart failure: Secondary | ICD-10-CM

## 2019-11-10 DIAGNOSIS — I4821 Permanent atrial fibrillation: Secondary | ICD-10-CM

## 2019-11-10 DIAGNOSIS — E8881 Metabolic syndrome: Secondary | ICD-10-CM | POA: Diagnosis not present

## 2019-11-10 DIAGNOSIS — I2721 Secondary pulmonary arterial hypertension: Secondary | ICD-10-CM

## 2019-11-10 DIAGNOSIS — Z9889 Other specified postprocedural states: Secondary | ICD-10-CM | POA: Diagnosis not present

## 2019-11-10 DIAGNOSIS — E119 Type 2 diabetes mellitus without complications: Secondary | ICD-10-CM | POA: Diagnosis not present

## 2019-11-10 DIAGNOSIS — I10 Essential (primary) hypertension: Secondary | ICD-10-CM

## 2019-11-10 LAB — POCT GLYCOSYLATED HEMOGLOBIN (HGB A1C): Hemoglobin A1C: 5.5 % (ref 4.0–5.6)

## 2019-11-10 MED ORDER — TRAMADOL HCL 50 MG PO TABS: 50.0000 mg | ORAL_TABLET | Freq: Three times a day (TID) | ORAL | 0 refills | Status: DC | PRN

## 2019-11-10 MED ORDER — OZEMPIC (0.25 OR 0.5 MG/DOSE) 2 MG/1.5ML ~~LOC~~ SOPN: PEN_INJECTOR | SUBCUTANEOUS | 5 refills | Status: AC

## 2019-11-10 MED ORDER — WARFARIN SODIUM 4 MG PO TABS: ORAL_TABLET | ORAL | 4 refills | Status: DC

## 2019-11-10 MED ORDER — ATORVASTATIN CALCIUM 40 MG PO TABS: 40.0000 mg | ORAL_TABLET | Freq: Every evening | ORAL | 3 refills | Status: DC

## 2019-11-10 NOTE — Patient Instructions (Signed)
Medication Instructions:  Your physician recommends that you continue on your current medications as directed. Please refer to the Current Medication list given to you today.  *If you need a refill on your cardiac medications before your next appointment, please call your pharmacy*  Lab Work: None today If you have labs (blood work) drawn today and your tests are completely normal, you will receive your results only by: Marland Kitchen MyChart Message (if you have MyChart) OR . A paper copy in the mail If you have any lab test that is abnormal or we need to change your treatment, we will call you to review the results.  Testing/Procedures: None today  Follow-Up: At [facility], you and your health needs are our priority.  As part of our continuing mission to provide you with exceptional heart care, we have created designated Provider Care Teams.  These Care Teams include your primary Cardiologist (physician) and Advanced Practice Providers (APPs -  Physician Assistants and Nurse Practitioners) who all work together to provide you with the care you need, when you need it.  Your next appointment:   6 month(s)  The format for your next appointment:   In Person  Provider:   Kate Sable, MD  Other Instructions None     Thank you for choosing [facility] !

## 2019-11-10 NOTE — Addendum Note (Signed)
Addended by: Barbarann Ehlers A on: 11/10/2019 12:36 PM   Modules accepted: Orders

## 2019-11-10 NOTE — Progress Notes (Signed)
Virtual Visit via Telephone Note   This visit type was conducted due to national recommendations for restrictions regarding the COVID-19 Pandemic (e.g. social distancing) in an effort to limit this patient's exposure and mitigate transmission in our community.  Due to his co-morbid illnesses, this patient is at least at moderate risk for complications without adequate follow up.  This format is felt to be most appropriate for this patient at this time.  The patient did not have access to video technology/had technical difficulties with video requiring transitioning to audio format only (telephone).  All issues noted in this document were discussed and addressed.  No physical exam could be performed with this format.  Please refer to the patient's chart for his  consent to telehealth for [facility].   Date:  11/10/2019   ID:  Jeffrey Larson, DOB [birth year], MRN [id number]  Patient Location: Home Provider Location: Office  PCP:  [name], DO  Cardiologist:  Kate Sable, MD  Electrophysiologist:  None   Evaluation Performed:  Follow-Up Visit  Chief Complaint:  CAD  History of Present Illness:    MARCQUEZ Larson is a 63 y.o. male with obesity, COPD, permanent atrial fibrillation, coronary artery disease, rheumatic mitral stenosis, tricuspid regurgitation, and chronic combined systolic and diastolic heart failure.  In 2013 he suffered an acute MI and was treated with multivessel PCI and stenting.  He underwent a sleep study which showed mild sleep apnea.  CPAP was not recommended.  He underwent cardiac catheterization on 06/10/2018.  He had nonobstructive disease with a mid RCA 50% stenosis and otherwise 20% stenosis seen in the proximal left circumflex and ostial to proximal LAD.  On 07/18/2018 he underwent mitral valve replacement with mechanical mitral valve, tricuspid valve repair using an Edwards mc3 ring annuloplasty, and Maze procedure.  Echocardiogram on  12/01/2018 showed normal left ventricular systolic function, LVEF 55 to 60%, mild LVH, normally functioning Sorin Carbomedics Optiform bileaflet 33 mm mechanical valve, severe left atrial dilatation, and moderate right ventricular dilatation with moderately reduced systolic function.  The tricuspid valve was not well visualized.  He was hospitalized with acute hypoxemic respiratory failure due to COVID-19 pneumonia in August 2020.  The patient denies any symptoms of chest pain, palpitations, shortness of breath, lightheadedness, dizziness, leg swelling, orthopnea, PND, and syncope.    Past Medical History:  Diagnosis Date  . Chronic combined systolic and diastolic congestive heart failure ([place])   . COPD with chronic bronchitis ([place])   . Coronary artery disease   . Essential hypertension, benign    Ramipril to losartan Sept 2015   . History of pneumonia    RML on CXR 07/20/14  . Hyperlipidemia   . Longstanding persistent atrial fibrillation ([place])   . Mitral stenosis with regurgitation   . Myocardial infarction ([place])   . Pulmonary hypertension ([place])   . Renal disorder    history kidney stone  . S/P Maze operation for atrial fibrillation 07/18/2018   Complete bilateral atrial lesion set using bipolar radiofrequency and cryothermy ablation with clipping of LA appendage  . S/P mitral valve replacement with metallic valve 0000000   Sorin Carbomedics Optiform bileaflet mechanical valve, size 33 mm  . S/P TVR (tricuspid valve repair) 07/18/2018   Edwards mc3 ring annuloplasty, size 30 mm   Past Surgical History:  Procedure Laterality Date  . APPENDECTOMY    . CARDIOVASCULAR STRESS TEST  03/2014   Borderline reversible ischemic changes at the apex.  Normal LV contractility/EF 52%.  Marland Kitchen  CORONARY STENT PLACEMENT  7/12013  . LEFT HEART CATHETERIZATION WITH CORONARY ANGIOGRAM N/A 06/12/2012   Procedure: LEFT HEART CATHETERIZATION WITH CORONARY ANGIOGRAM;  Surgeon: Clent Demark, MD;  Location: [facility]  CATH LAB;  Service: Cardiovascular;  Laterality: N/A;  . MAZE N/A 07/18/2018   Procedure: MAZE;  Surgeon: Rexene Alberts, MD;  Location: [place];  Service: Open Heart Surgery;  Laterality: N/A;  . MITRAL VALVE REPLACEMENT N/A 07/18/2018   Procedure: MITRAL VALVE (MV) REPLACEMENT WITH CARBOMEDICS OPTIFORM MITRAL VALVE SIZE 33.;  Surgeon: Rexene Alberts, MD;  Location: [place];  Service: Open Heart Surgery;  Laterality: N/A;  . PERCUTANEOUS CORONARY STENT INTERVENTION (PCI-S) N/A 06/17/2012   Procedure: PERCUTANEOUS CORONARY STENT INTERVENTION (PCI-S);  Surgeon: Clent Demark, MD;  Location: [facility] CATH LAB;  Service: Cardiovascular;  Laterality: N/A;  . RIGHT/LEFT HEART CATH AND CORONARY ANGIOGRAPHY N/A 06/10/2018   Procedure: RIGHT/LEFT HEART CATH AND CORONARY ANGIOGRAPHY;  Surgeon: Dixie Dials, MD;  Location: [place] CV LAB;  Service: Cardiovascular;  Laterality: N/A;  . TEE WITHOUT CARDIOVERSION N/A 06/10/2018   Procedure: TRANSESOPHAGEAL ECHOCARDIOGRAM (TEE) with bubble study;  Surgeon: Dixie Dials, MD;  Location: [facility] ENDOSCOPY;  Service: Cardiovascular;  Laterality: N/A;  . TEE WITHOUT CARDIOVERSION N/A 07/18/2018   Procedure: TRANSESOPHAGEAL ECHOCARDIOGRAM (TEE);  Surgeon: Rexene Alberts, MD;  Location: [place];  Service: Open Heart Surgery;  Laterality: N/A;  . TRICUSPID VALVE REPLACEMENT N/A 07/18/2018   Procedure: TRICUSPID VALVE REPAIR WITH EDWARDS MC3 TRICUSPID ANNULOPLASTY RING SIZE 30.;  Surgeon: Rexene Alberts, MD;  Location: [place];  Service: Open Heart Surgery;  Laterality: N/A;     Current Meds  Medication Sig  . acetaminophen (TYLENOL) 500 MG tablet Take 1,000 mg by mouth 2 (two) times daily.  Marland Kitchen albuterol (PROVENTIL) (5 MG/ML) 0.5% nebulizer solution Take 0.5 mLs (2.5 mg total) by nebulization every 2 (two) hours as needed for wheezing or shortness of breath.  Marland Kitchen albuterol (VENTOLIN HFA) 108 (90 Base) MCG/ACT inhaler Inhale 2 puffs into the lungs 2 (two) times daily.  .  AMBULATORY NON FORMULARY MEDICATION Medication Name: [employer] oxygen concentrator with supplies, Dx: COPD, severe,  4 liters. Fax to [employer].  . AMBULATORY NON FORMULARY MEDICATION Order to discontinue supplemental oxygen. Please fax to [phone]  . aspirin 81 MG tablet Take 81 mg by mouth daily.  Marland Kitchen atorvastatin (LIPITOR) 40 MG tablet Take 1 tablet (40 mg total) by mouth every evening.  Marland Kitchen dexamethasone (DECADRON) 4 MG tablet Take 1 tablet (4 mg total) by mouth 2 (two) times daily with a meal.  . Fluticasone-Umeclidin-Vilant (TRELEGY ELLIPTA) 100-62.5-25 MCG/INH AEPB Inhale 1 puff into the lungs daily.  . metoprolol succinate (TOPROL-XL) 50 MG 24 hr tablet Take 1 tablet (50 mg total) by mouth daily. Take with or immediately following a meal.  . nitroGLYCERIN (NITROSTAT) 0.4 MG SL tablet Place 1 tablet (0.4 mg total) under the tongue every 5 (five) minutes x 3 doses as needed for chest pain.  . Omega-3 Fatty Acids (FISH OIL) 1200 MG CAPS Take 1,200 mg by mouth daily.  . sacubitril-valsartan (ENTRESTO) 24-26 MG Take 1 tablet by mouth 2 (two) times daily.  . Semaglutide,0.25 or 0.5MG /DOS, (OZEMPIC, 0.25 OR 0.5 MG/DOSE,) 2 MG/1.5ML SOPN Inject 0.25 mg into the skin once a week for 14 days, THEN 0.5 mg once a week.  . sildenafil (REVATIO) 20 MG tablet Take 1 tablet (20 mg total) by mouth 3 (three) times daily.  Marland Kitchen spironolactone (ALDACTONE) 25 MG  tablet TAKE 1 TABLET(25 MG) BY MOUTH DAILY  . torsemide (DEMADEX) 20 MG tablet Take 2 tablets (40 mg total) by mouth daily.  . traMADol (ULTRAM) 50 MG tablet Take 1 tablet (50 mg total) by mouth every 8 (eight) hours as needed for moderate pain.  Marland Kitchen warfarin (COUMADIN) 4 MG tablet Take 1 tablet daily except 1 1/2 tablets on Mondays and Thursdays or as directed (Patient taking differently: Take 4-6 mg by mouth See admin instructions. 6 mg on Monday and 6 mg on Thursday. 4 mg every other day)     Allergies:   Ramipril and Percocet  [oxycodone-acetaminophen]   Social History   Tobacco Use  . Smoking status: Former Smoker    Packs/day: 2.00    Years: 15.00    Pack years: 30.00    Types: Cigarettes    Quit date: 11/26/1996    Years since quitting: 22.9  . Smokeless tobacco: Never Used  Substance Use Topics  . Alcohol use: No  . Drug use: No     Family Hx: The patient's family history includes Heart disease in his father and mother.  ROS:   Please see the history of present illness.     All other systems reviewed and are negative.   Prior CV studies:   The following studies were reviewed today:  Reviewed above  Labs/Other Tests and Data Reviewed:    EKG:  No ECG reviewed.  Recent Labs: 12/08/2018: TSH 1.30 07/17/2019: Magnesium 2.4 08/06/2019: Hemoglobin 12.9; Platelets 519 08/20/2019: ALT 23; Brain Natriuretic Peptide 45 09/10/2019: BUN 17; Creat 0.86; Potassium 4.4; Sodium 140   Recent Lipid Panel Lab Results  Component Value Date/Time   CHOL 139 12/08/2018 09:56 AM   TRIG 180 (H) 07/08/2019 03:29 PM   HDL 33 (L) 12/08/2018 09:56 AM   CHOLHDL 4.2 12/08/2018 09:56 AM   LDLCALC 77 12/08/2018 09:56 AM    Wt Readings from Last 3 Encounters:  11/10/19 (!) 317 lb (143.8 kg)  11/10/19 (!) 317 lb 1.3 oz (143.8 kg)  09/10/19 295 lb (133.8 kg)     Objective:    Vital Signs:  BP 122/77   Pulse 83   Ht 5\' 11"  (1.803 m)   Wt (!) 317 lb (143.8 kg)   BMI 44.21 kg/m    VITAL SIGNS:  reviewed  ASSESSMENT & PLAN:    1.  Severe mitral stenosis: Status post mitral valve replacement with a Sorin Carbomedics Optiform bileaflet 33 mm mechanical valve on 07/18/2018.  Stable and functioning normally by echocardiogram in January 2020.  2.  Tricuspid regurgitation: Status post tricuspid valve repair on 07/18/2018 using an Edwards mc3 ring annuloplasty.  3.  Chronic diastolic heart failure: LVEF normal by echocardiogram on 12/01/2018.  Euvolemic.  Continue Toprol-XL, Entresto, torsemide, and  spironolactone.  4.  Pulmonary hypertension: Currently on sildenafil and torsemide.  Mild sleep apnea noted.  5.  Permanent atrial fibrillation: Heart rate is controlled on Toprol-XL.  Anticoagulated with warfarin.  6.  Coronary artery disease: Symptomatically stable.  Currently on aspirin, atorvastatin, and Toprol-XL.  Symptomatically stable.  History of multivessel stenting.  Most recent coronary angiogram in July 2019 detailed above with nonobstructive disease noted.  7.  Morbid obesity: He needs significant weight loss.  He is participating in cardiac rehabilitation.  8.  Hyperlipidemia: Lipids reviewed.  Continue atorvastatin.  9.  Hypertension: Blood pressure is normal.  No changes to therapy.    COVID-19 Education: The signs and symptoms of COVID-19 were discussed with the  patient and how to seek care for testing (follow up with PCP or arrange E-visit).  The importance of social distancing was discussed today.  Time:   Today, I have spent 10 minutes with the patient with telehealth technology discussing the above problems.     Medication Adjustments/Labs and Tests Ordered: Current medicines are reviewed at length with the patient today.  Concerns regarding medicines are outlined above.   Tests Ordered: No orders of the defined types were placed in this encounter.   Medication Changes: No orders of the defined types were placed in this encounter.   Follow Up:  Virtual Visit  in 6 month(s)  Signed, Kate Sable, MD  11/10/2019 11:37 AM    [place]

## 2019-11-10 NOTE — Progress Notes (Signed)
HPI: Jeffrey Larson is a 63 y.o. male who  has a past medical history of Chronic combined systolic and diastolic congestive heart failure ([place]), COPD with chronic bronchitis ([place]), Coronary artery disease, Essential hypertension, benign, History of pneumonia, Hyperlipidemia, Longstanding persistent atrial fibrillation ([place]), Mitral stenosis with regurgitation, Myocardial infarction [facility]), Pulmonary hypertension ([place]), Renal disorder, S/P Maze operation for atrial fibrillation (07/18/2018), S/P mitral valve replacement with metallic valve (0000000), and S/P TVR (tricuspid valve repair) (07/18/2018).  he presents to [facility] today, 11/10/19,  for chief complaint of:  FOLLOW-UP BREATHING  Seen by Dr Georgina Snell 2 mos ago who advised him f/u w/ me for COD. Pt reports breathing is a lot better, no major concerns, he's like to lose weight but finds exercise difficult given orthopedic issues, inquires about medications to help curb appetite. Pt also due for A1C/sugar check.      At today's visit 11/10/19 ... PMH, PSH, FH reviewed and updated as needed.  Current medication list and allergy/intolerance hx reviewed and updated as needed. (See remainder of HPI, ROS, Phys Exam below)   No results found.  Results for orders placed or performed in visit on 11/10/19 (from the past 72 hour(s))  POCT HgB A1C     Status: None   Collection Time: 11/10/19  9:52 AM  Result Value Ref Range   Hemoglobin A1C 5.5 4.0 - 5.6 %   HbA1c POC (<> result, manual entry)     HbA1c, POC (prediabetic range)     HbA1c, POC (controlled diabetic range)            ASSESSMENT/PLAN: The primary encounter diagnosis was Morbid obesity ([place]). Diagnoses of Controlled type 2 diabetes mellitus without complication, without long-term current use of insulin ([place]), Metabolic syndrome, S/P TVR (tricuspid valve repair), and S/P mitral valve replacement with metallic valve  were also pertinent to  this visit.  Morbid obesity; BMI + comorbid conditions  DM under great control, we might try Ozemic for weight loss or would consider Saxenda   Orders Placed This Encounter  Procedures  . POCT HgB A1C     Meds ordered this encounter  Medications  . traMADol (ULTRAM) 50 MG tablet    Sig: Take 1 tablet (50 mg total) by mouth every 8 (eight) hours as needed for moderate pain.    Dispense:  30 tablet    Refill:  0  . Semaglutide,0.25 or 0.5MG /DOS, (OZEMPIC, 0.25 OR 0.5 MG/DOSE,) 2 MG/1.5ML SOPN    Sig: Inject 0.25 mg into the skin once a week for 14 days, THEN 0.5 mg once a week.    Dispense:  5 pen    Refill:  5       Follow-up plan: Return in about 4 months (around 03/10/2020) for recheck weight and breathing, see me sooner if needed! .                                                 ################################################# ################################################# ################################################# #################################################    Current Meds  Medication Sig  . acetaminophen (TYLENOL) 500 MG tablet Take 1,000 mg by mouth 2 (two) times daily.  Marland Kitchen albuterol (PROVENTIL) (5 MG/ML) 0.5% nebulizer solution Take 0.5 mLs (2.5 mg total) by nebulization every 2 (two) hours as needed for wheezing or shortness of breath.  Marland Kitchen albuterol (VENTOLIN HFA) 108 (90 Base)  MCG/ACT inhaler Inhale 2 puffs into the lungs 2 (two) times daily.  . AMBULATORY NON FORMULARY MEDICATION Medication Name: [employer] oxygen concentrator with supplies, Dx: COPD, severe,  4 liters. Fax to [employer].  . AMBULATORY NON FORMULARY MEDICATION Order to discontinue supplemental oxygen. Please fax to [phone]  . aspirin 81 MG tablet Take 81 mg by mouth daily.  Marland Kitchen atorvastatin (LIPITOR) 40 MG tablet Take 1 tablet (40 mg total) by mouth every evening.  Marland Kitchen dexamethasone (DECADRON) 4 MG tablet Take 1 tablet (4 mg total)  by mouth 2 (two) times daily with a meal.  . Fluticasone-Umeclidin-Vilant (TRELEGY ELLIPTA) 100-62.5-25 MCG/INH AEPB Inhale 1 puff into the lungs daily.  . metoprolol succinate (TOPROL-XL) 50 MG 24 hr tablet Take 1 tablet (50 mg total) by mouth daily. Take with or immediately following a meal.  . nitroGLYCERIN (NITROSTAT) 0.4 MG SL tablet Place 1 tablet (0.4 mg total) under the tongue every 5 (five) minutes x 3 doses as needed for chest pain.  . Omega-3 Fatty Acids (FISH OIL) 1200 MG CAPS Take 1,200 mg by mouth daily.  . sacubitril-valsartan (ENTRESTO) 24-26 MG Take 1 tablet by mouth 2 (two) times daily.  . sildenafil (REVATIO) 20 MG tablet Take 1 tablet (20 mg total) by mouth 3 (three) times daily.  Marland Kitchen spironolactone (ALDACTONE) 25 MG tablet TAKE 1 TABLET(25 MG) BY MOUTH DAILY  . torsemide (DEMADEX) 20 MG tablet Take 2 tablets (40 mg total) by mouth daily.  Marland Kitchen warfarin (COUMADIN) 4 MG tablet Take 1 tablet daily except 1 1/2 tablets on Mondays and Thursdays or as directed (Patient taking differently: Take 4-6 mg by mouth See admin instructions. 6 mg on Monday and 6 mg on Thursday. 4 mg every other day)    Allergies  Allergen Reactions  . Ramipril Cough    cough  . Percocet [Oxycodone-Acetaminophen]     In ICU it contributed to hallucinations. Can take by itself       Review of Systems:  Constitutional: No recent illness  Cardiac: No  chest pain, No  pressure, No palpitations  Respiratory:  No  shortness of breath. No  Cough  Gastrointestinal: No  abdominal pain, no change on bowel habits  Musculoskeletal: No new myalgia/arthralgia  Neurologic: No  weakness, No  Dizziness  Psychiatric: No  concerns with depression, No  concerns with anxiety  Exam:  BP 122/77 (BP Location: Left Arm, Patient Position: Sitting, Cuff Size: Normal)   Pulse 83   Temp 97.9 F (36.6 C) (Oral)   Wt (!) 317 lb 1.3 oz (143.8 kg)   BMI 44.22 kg/m   Constitutional: VS see above. General Appearance:  alert, well-developed, well-nourished, NAD  Neck: No masses, trachea midline.   Respiratory: Normal respiratory effort. no wheeze, no rhonchi, no rales  Cardiovascular: S1/S2 normal, no murmur, no rub/gallop auscultated. RRR. +artificial valve click   Musculoskeletal: Gait normal. Symmetric and independent movement of all extremities  Neurological: Normal balance/coordination. No tremor.  Skin: warm, dry, intact.   Psychiatric: Normal judgment/insight. Normal mood and affect. Oriented x3.       Visit summary with medication list and pertinent instructions was printed for patient to review, patient was advised to alert [place] if any updates are needed. All questions at time of visit were answered - patient instructed to contact office with any additional concerns. ER/RTC precautions were reviewed with the patient and understanding verbalized.   Note: Total time spent 25 minutes, greater than 50% of the visit was spent face-to-face  counseling and coordinating care for the following: The primary encounter diagnosis was Morbid obesity ([place]). Diagnoses of Controlled type 2 diabetes mellitus without complication, without long-term current use of insulin ([place]), Metabolic syndrome, S/P TVR (tricuspid valve repair), and S/P mitral valve replacement with metallic valve  were also pertinent to this visit.Marland Kitchen  Please note: voice recognition software was used to produce this document, and typos may escape review. Please contact Dr. Sheppard Coil for any needed clarifications.    Follow up plan: Return in about 4 months (around 03/10/2020) for recheck weight and breathing, see me sooner if needed! Marland Kitchen

## 2019-11-10 NOTE — Telephone Encounter (Signed)
Gwenlyn Found called and states the Ozempic needs a prior authorization.

## 2019-11-13 NOTE — Telephone Encounter (Signed)
Ozempic is approved form this date:11/11/2019 through 11/09/2020. Pharmacy aware. The pharmacy will call the patient when its ready for pick up. Patient is aware and did not have any questions.

## 2019-12-08 ENCOUNTER — Telehealth (HOSPITAL_COMMUNITY): Payer: Self-pay | Admitting: Pharmacist

## 2019-12-08 NOTE — Telephone Encounter (Signed)
Patient Advocate Encounter   Received notification from [facility] that prior authorization for Sildenafil is required.   PA submitted via fax Status is pending   Will continue to follow.  Audry Riles, PharmD, BCPS, BCCP, CPP Heart Failure Clinic Pharmacist [phone]

## 2019-12-09 NOTE — Telephone Encounter (Signed)
Patient Advocate Encounter  Received notification from [employer] that the request for prior authorization for sildenafil has been denied due to patient being NYHA class I, not NYHA class II-IV.    I have asked to discuss the case with a clinic reviewer.  This encounter will continue to be updated until final determination.  Left VM informing patient of decision.    Audry Riles, PharmD, BCPS, BCCP, CPP Heart Failure Clinic Pharmacist [phone]

## 2019-12-09 NOTE — Telephone Encounter (Signed)
Advanced Heart Failure Patient Advocate Encounter  Prior Authorization for Sildenafil has been approved on appeal.    PA# [id number] Effective dates: 12/08/19 through 12/08/19  Patient aware of decision.   Audry Riles, PharmD, BCPS, BCCP, CPP Heart Failure Clinic Pharmacist [phone]

## 2019-12-14 ENCOUNTER — Other Ambulatory Visit: Payer: Self-pay

## 2019-12-14 ENCOUNTER — Ambulatory Visit (INDEPENDENT_AMBULATORY_CARE_PROVIDER_SITE_OTHER): Payer: 59 | Admitting: *Deleted

## 2019-12-14 DIAGNOSIS — Z954 Presence of other heart-valve replacement: Secondary | ICD-10-CM | POA: Diagnosis not present

## 2019-12-14 DIAGNOSIS — Z5181 Encounter for therapeutic drug level monitoring: Secondary | ICD-10-CM | POA: Diagnosis not present

## 2019-12-14 DIAGNOSIS — I4891 Unspecified atrial fibrillation: Secondary | ICD-10-CM

## 2019-12-14 LAB — POCT INR: INR: 2.6 (ref 2.0–3.0)

## 2019-12-14 NOTE — Patient Instructions (Signed)
Continue warfarin 4mg  daily. Recheck  INR in 6 wks

## 2019-12-24 ENCOUNTER — Telehealth (HOSPITAL_COMMUNITY): Payer: Self-pay | Admitting: Pharmacist

## 2019-12-24 MED ORDER — SILDENAFIL CITRATE 20 MG PO TABS: 20.0000 mg | ORAL_TABLET | Freq: Three times a day (TID) | ORAL | 3 refills | Status: DC

## 2019-12-24 NOTE — Telephone Encounter (Signed)
Sent sildenafil prescription to [facility] per patient request.   Audry Riles, PharmD, BCPS, BCCP, CPP Heart Failure Clinic Pharmacist [phone]

## 2020-01-05 ENCOUNTER — Telehealth: Payer: Self-pay

## 2020-01-05 NOTE — Telephone Encounter (Signed)
[employer] approved Entresto from 01/04/2020 through 01/03/2021 phone [phone]

## 2020-01-06 ENCOUNTER — Telehealth (HOSPITAL_COMMUNITY): Payer: Self-pay | Admitting: Pharmacist

## 2020-01-06 MED ORDER — ENTRESTO 24-26 MG PO TABS: 1.0000 | ORAL_TABLET | Freq: Two times a day (BID) | ORAL | 0 refills | Status: DC

## 2020-01-25 ENCOUNTER — Ambulatory Visit (INDEPENDENT_AMBULATORY_CARE_PROVIDER_SITE_OTHER): Payer: 59 | Admitting: *Deleted

## 2020-01-25 ENCOUNTER — Other Ambulatory Visit: Payer: Self-pay

## 2020-01-25 DIAGNOSIS — I4891 Unspecified atrial fibrillation: Secondary | ICD-10-CM | POA: Diagnosis not present

## 2020-01-25 DIAGNOSIS — Z5181 Encounter for therapeutic drug level monitoring: Secondary | ICD-10-CM | POA: Diagnosis not present

## 2020-01-25 DIAGNOSIS — Z954 Presence of other heart-valve replacement: Secondary | ICD-10-CM | POA: Diagnosis not present

## 2020-01-25 LAB — POCT INR: INR: 3 (ref 2.0–3.0)

## 2020-01-25 NOTE — Patient Instructions (Signed)
Continue warfarin 4mg  daily. Recheck  INR in 6 wks

## 2020-03-07 ENCOUNTER — Other Ambulatory Visit: Payer: Self-pay

## 2020-03-07 ENCOUNTER — Ambulatory Visit (INDEPENDENT_AMBULATORY_CARE_PROVIDER_SITE_OTHER): Payer: 59 | Admitting: *Deleted

## 2020-03-07 DIAGNOSIS — Z954 Presence of other heart-valve replacement: Secondary | ICD-10-CM | POA: Diagnosis not present

## 2020-03-07 DIAGNOSIS — I4891 Unspecified atrial fibrillation: Secondary | ICD-10-CM

## 2020-03-07 DIAGNOSIS — Z5181 Encounter for therapeutic drug level monitoring: Secondary | ICD-10-CM | POA: Diagnosis not present

## 2020-03-07 LAB — POCT INR: INR: 2.3 (ref 2.0–3.0)

## 2020-03-07 NOTE — Patient Instructions (Signed)
Take warfarin 1 1/2 tablets tonight then resume 1 tablet daily. Recheck  INR in 6 wks

## 2020-03-10 ENCOUNTER — Encounter: Payer: Self-pay | Admitting: Osteopathic Medicine

## 2020-03-10 ENCOUNTER — Other Ambulatory Visit: Payer: Self-pay

## 2020-03-10 ENCOUNTER — Ambulatory Visit (INDEPENDENT_AMBULATORY_CARE_PROVIDER_SITE_OTHER): Payer: 59 | Admitting: Osteopathic Medicine

## 2020-03-10 DIAGNOSIS — E8881 Metabolic syndrome: Secondary | ICD-10-CM | POA: Diagnosis not present

## 2020-03-10 DIAGNOSIS — Z954 Presence of other heart-valve replacement: Secondary | ICD-10-CM

## 2020-03-10 DIAGNOSIS — E119 Type 2 diabetes mellitus without complications: Secondary | ICD-10-CM | POA: Diagnosis not present

## 2020-03-10 DIAGNOSIS — Z0000 Encounter for general adult medical examination without abnormal findings: Secondary | ICD-10-CM

## 2020-03-10 DIAGNOSIS — Z9889 Other specified postprocedural states: Secondary | ICD-10-CM | POA: Diagnosis not present

## 2020-03-10 MED ORDER — OZEMPIC (1 MG/DOSE) 2 MG/1.5ML ~~LOC~~ SOPN: 1.0000 mg | PEN_INJECTOR | SUBCUTANEOUS | 11 refills | Status: DC

## 2020-03-10 NOTE — Progress Notes (Signed)
Jeffrey Larson is a 64 y.o. male who presents to  [place] at [facility]  today, 03/10/20, seeking care for the following: . Recheck COPD, fatigue  . Recheck on Ozempic for weight loss   Wt Readings from Last 3 Encounters:  03/10/20 (!) 308 lb (139.7 kg)  11/10/19 (!) 317 lb (143.8 kg)  11/10/19 (!) 317 lb 1.3 oz (143.8 kg)   Labs 07/2019 and 08/2019: CMP, BNP, all ok other than Glc a bit high and albumin a bit low    ASSESSMENT & PLAN with other pertinent history/findings:  The primary encounter diagnosis was Morbid obesity ([place]). Diagnoses of Controlled type 2 diabetes mellitus without complication, without long-term current use of insulin ([place]), Metabolic syndrome, S/P TVR (tricuspid valve repair), S/P mitral valve replacement with metallic valve , and Annual physical exam were also pertinent to this visit.  Doing really well  Has lost some weight on Ozempic, diet still an issue for him Never had nausea on the Rx  OK to increase to 1 mg weekly   Labs ordered for future visit. Annual physical / preventive care was NOT performed or billed today.    There are no Patient Instructions on file for this visit.   Orders Placed This Encounter  Procedures  . CBC  . COMPLETE METABOLIC PANEL WITH GFR  . Lipid panel  . Hemoglobin A1c  . PSA, Total with Reflex to PSA, Free    Meds ordered this encounter  Medications  . Semaglutide, 1 MG/DOSE, (OZEMPIC, 1 MG/DOSE,) 2 MG/1.5ML SOPN    Sig: Inject 1 mg into the skin once a week.    Dispense:  5 pen    Refill:  11    Please disp: QS 90 days w/ 3 refills thanks       Follow-up instructions: Return in about 3 months (around 06/09/2020) for ANNUAL (get fasting labs prior to visit, orders are in).                                         BP 110/75 (BP Location: Left Arm, Patient Position: Sitting, Cuff Size: Large)   Pulse 67   Wt (!) 308 lb  (139.7 kg)   SpO2 97%   BMI 42.96 kg/m   No outpatient medications have been marked as taking for the 03/10/20 encounter (Office Visit) with [name], DO.    No results found for this or any previous visit (from the past 72 hour(s)).  No results found.  Depression screen [facility] 2/9 11/10/2019 01/14/2019 08/02/2017  Decreased Interest 0 0 0  Down, Depressed, Hopeless 0 0 0  PHQ - 2 Score 0 0 0  Altered sleeping 0 0 -  Tired, decreased energy 0 0 -  Change in appetite 3 0 -  Feeling bad or failure about yourself  0 0 -  Trouble concentrating 1 0 -  Moving slowly or fidgety/restless 0 0 -  Suicidal thoughts 0 0 -  PHQ-9 Score 4 0 -  Difficult doing work/chores Not difficult at all Not difficult at all -    GAD 7 : Generalized Anxiety Score 11/10/2019  Nervous, Anxious, on Edge 0  Control/stop worrying 0  Worry too much - different things 0  Trouble relaxing 0  Restless 0  Easily annoyed or irritable 0  Afraid - awful might happen 0  Total GAD  7 Score 0  Anxiety Difficulty Not difficult at all      All questions at time of visit were answered - patient instructed to contact office with any additional concerns or updates.  ER/RTC precautions were reviewed with the patient.  Please note: voice recognition software was used to produce this document, and typos may escape review. Please contact Dr. Sheppard Coil for any needed clarifications.

## 2020-03-10 NOTE — Progress Notes (Signed)
Patient states he feels better than he has in several years.  Breathing is doing wonderfully. His main problem is controlling his eating. He loves cake.

## 2020-03-17 IMAGING — CR DG CHEST 1V PORT
1 series · 1 of 1 positions shown · non-contrast
Comparison: 07/08/2019, 08/12/2018

CLINICAL DATA: Shortness of breath

EXAM:
PORTABLE CHEST 1 VIEW

[portable]
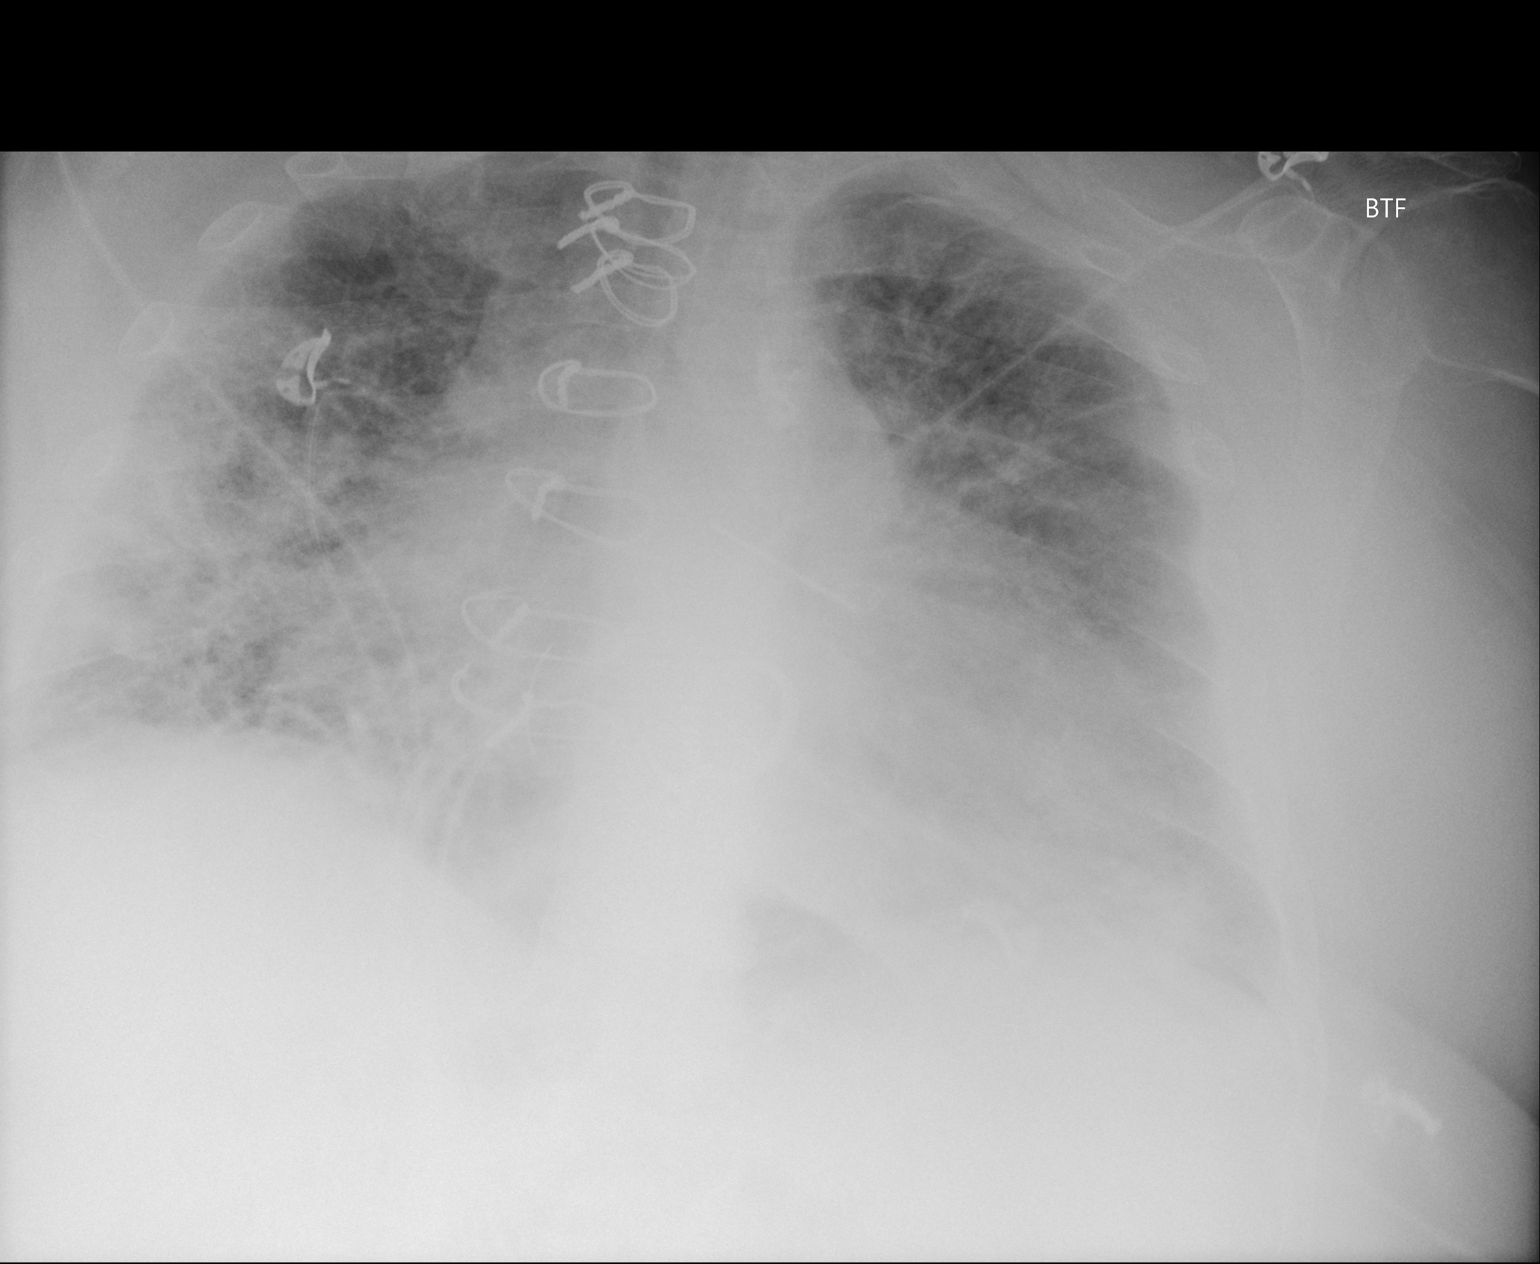

[1 of 1 positions shown; findings below may reference images not displayed]

FINDINGS: Post sternotomy changes. Valve replacement. Cardiomegaly with
vascular congestion. Interval worsening of right greater than left
patchy pulmonary airspace disease. No pneumothorax.
IMPRESSION: 1. Interval worsening of right greater than left airspace disease
which may reflect residual or worsening pneumonia
2. Cardiomegaly with vascular congestion. Suspect a degree of
underlying interstitial edema.

## 2020-03-17 IMAGING — US US EXTREM LOW VENOUS*R*
1 series · 13 of 24 positions shown · non-contrast
Comparison: No recent prior.

CLINICAL DATA: Redness and pain.



[Series 1: us extrem low venous*right* · 0.08mm/px · 13 of 47 slices shown]
[im 1/47]
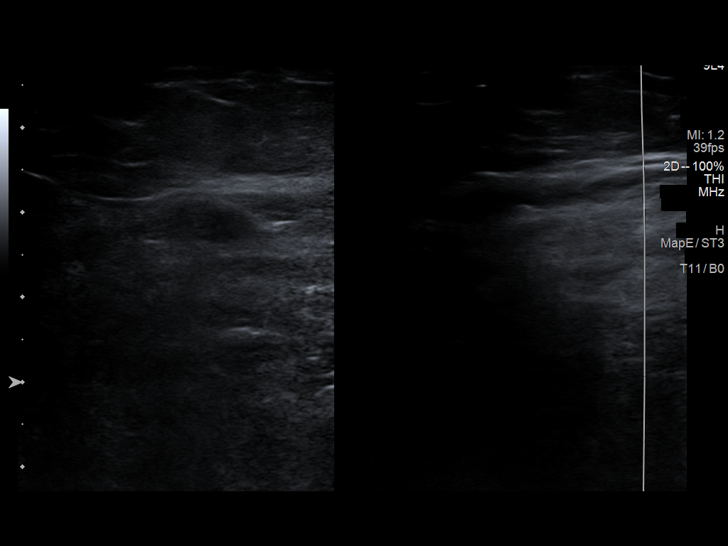
[im 5/47]
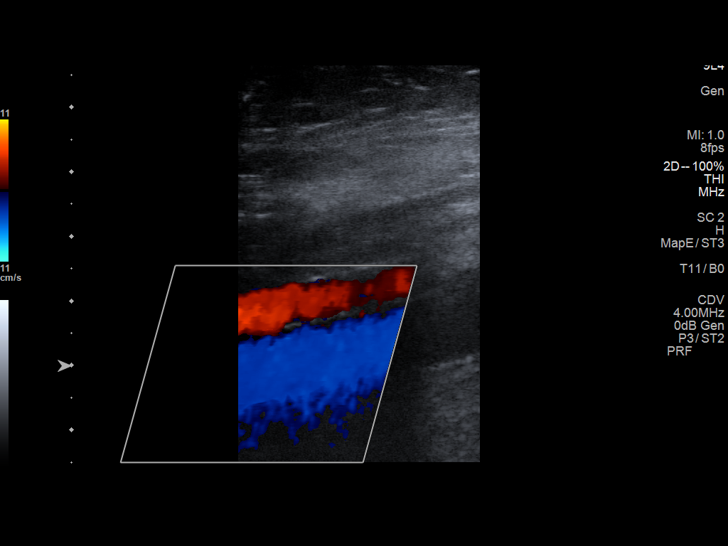
[im 9/47]
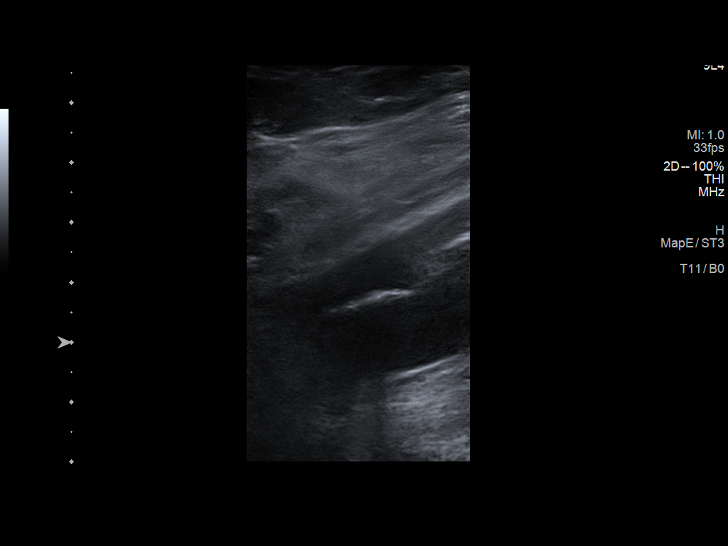
[im 13/47]
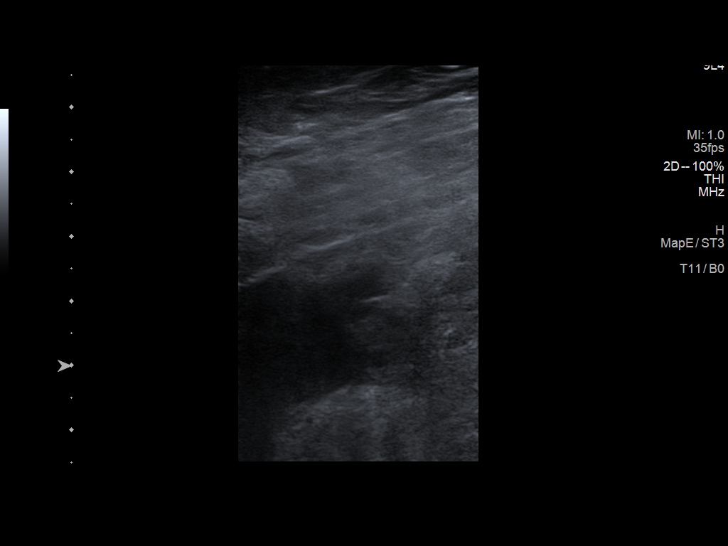
[im 17/47]
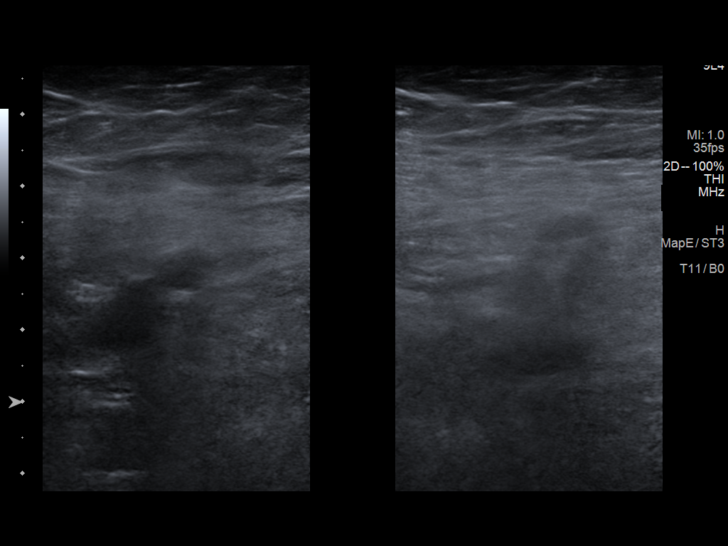
[im 21/47]
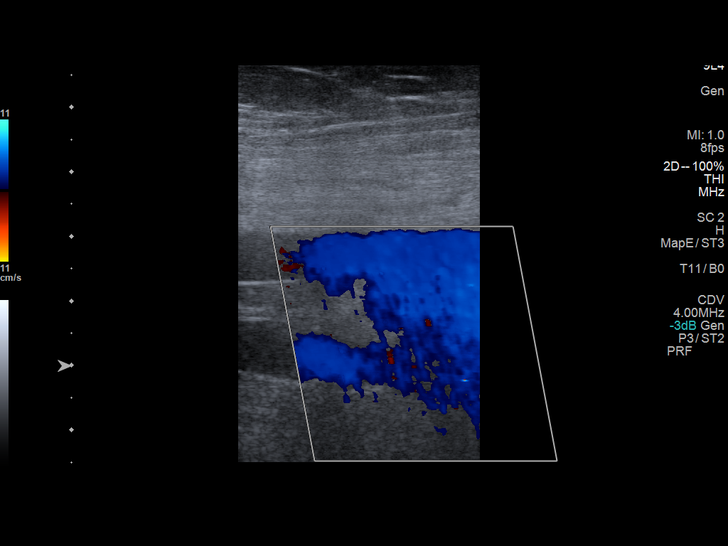
[im 25/47]
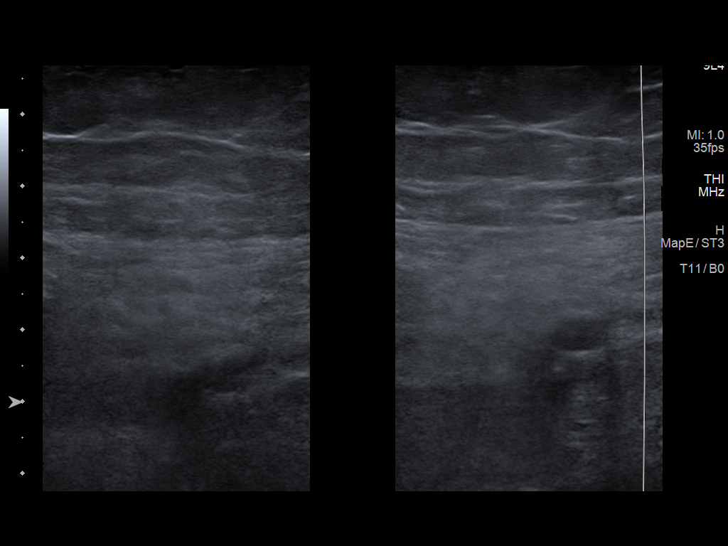
[im 27/47]
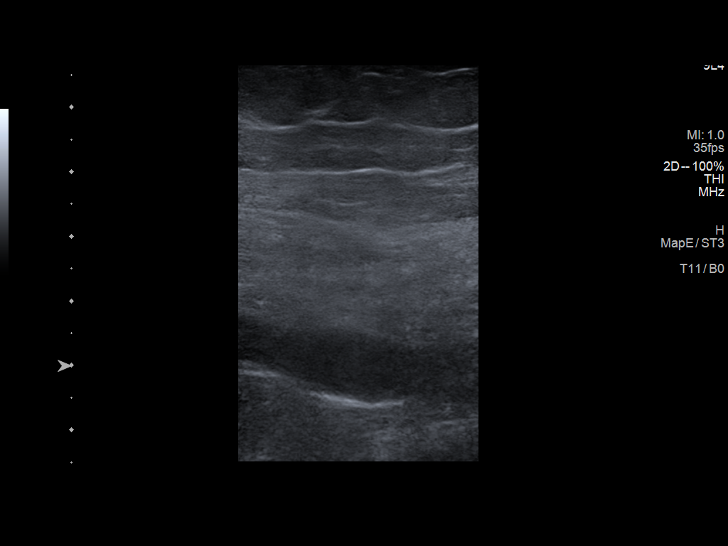
[im 31/47]
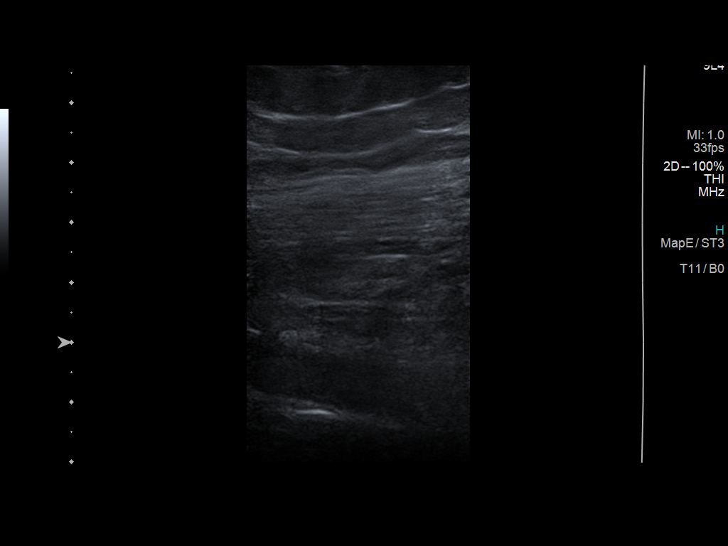
[im 35/47]
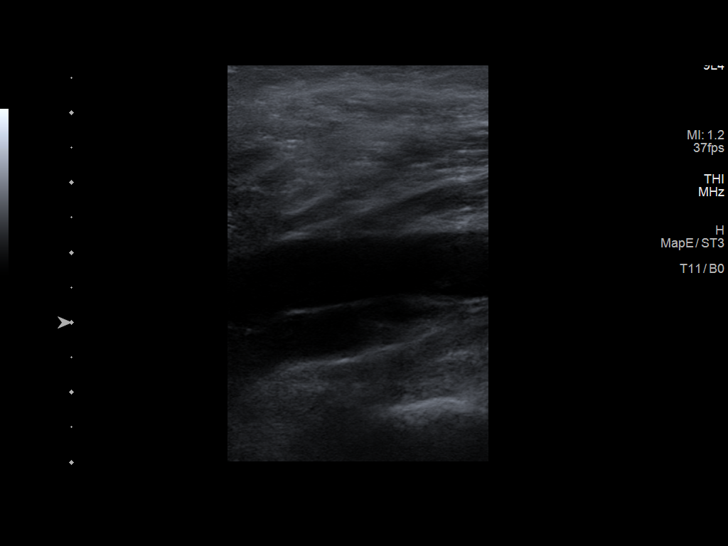
[im 39/47]
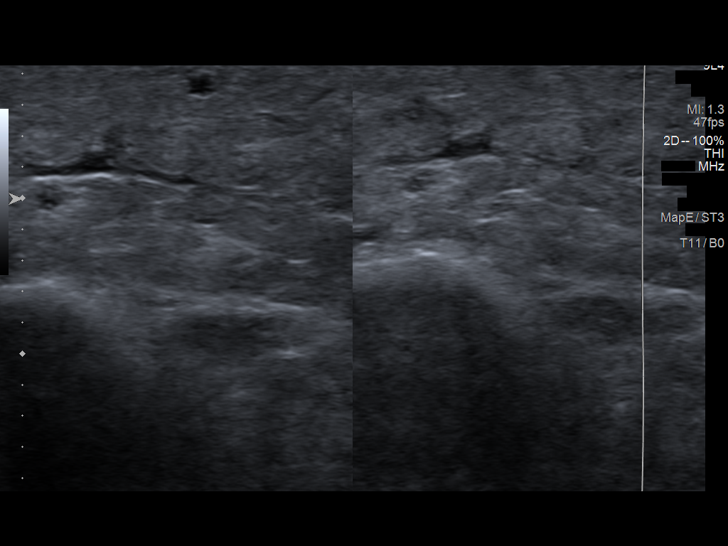
[im 43/47]
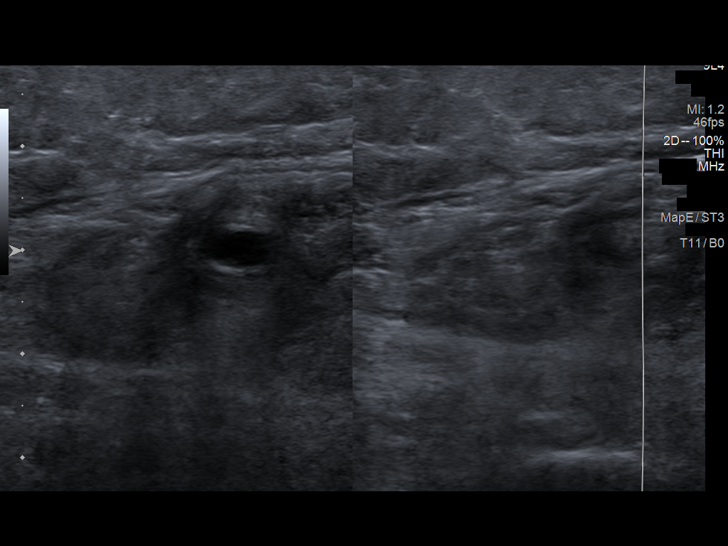
[im 47/47]
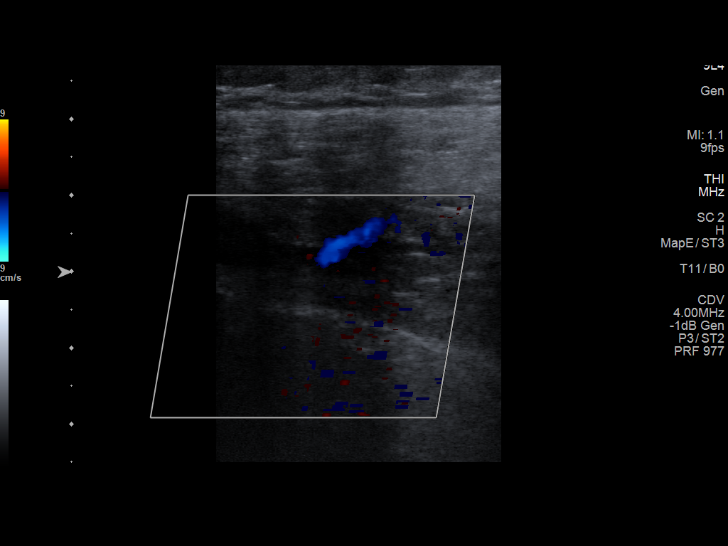

[13 of 24 positions shown; findings below may reference images not displayed]

FINDINGS: Contralateral Common Femoral Vein: Respiratory phasicity is normal
and symmetric with the symptomatic side. No evidence of thrombus.
Normal compressibility.

Common Femoral Vein: No evidence of thrombus. Normal
compressibility, respiratory phasicity and response to augmentation.

Saphenofemoral Junction: No evidence of thrombus. Normal
compressibility and flow on color Doppler imaging.

Profunda Femoral Vein: No evidence of thrombus. Normal
compressibility and flow on color Doppler imaging.

Femoral Vein: No evidence of thrombus. Normal compressibility,
respiratory phasicity and response to augmentation.

Popliteal Vein: No evidence of thrombus. Normal compressibility,
respiratory phasicity and response to augmentation.

Calf Veins: No evidence of thrombus. Normal compressibility and flow
on color Doppler imaging.

Superficial Great Saphenous Vein: No evidence of thrombus. Normal
compressibility.

Other Findings:  None.
IMPRESSION: No evidence of deep venous thrombosis.

## 2020-04-06 IMAGING — DX DG CHEST 2V
2 series · 2 of 2 positions shown · non-contrast
Comparison: Radiographs August 06, 2019.

CLINICAL DATA: Shortness of breath.

EXAM:
CHEST - 2 VIEW

[chest pa]
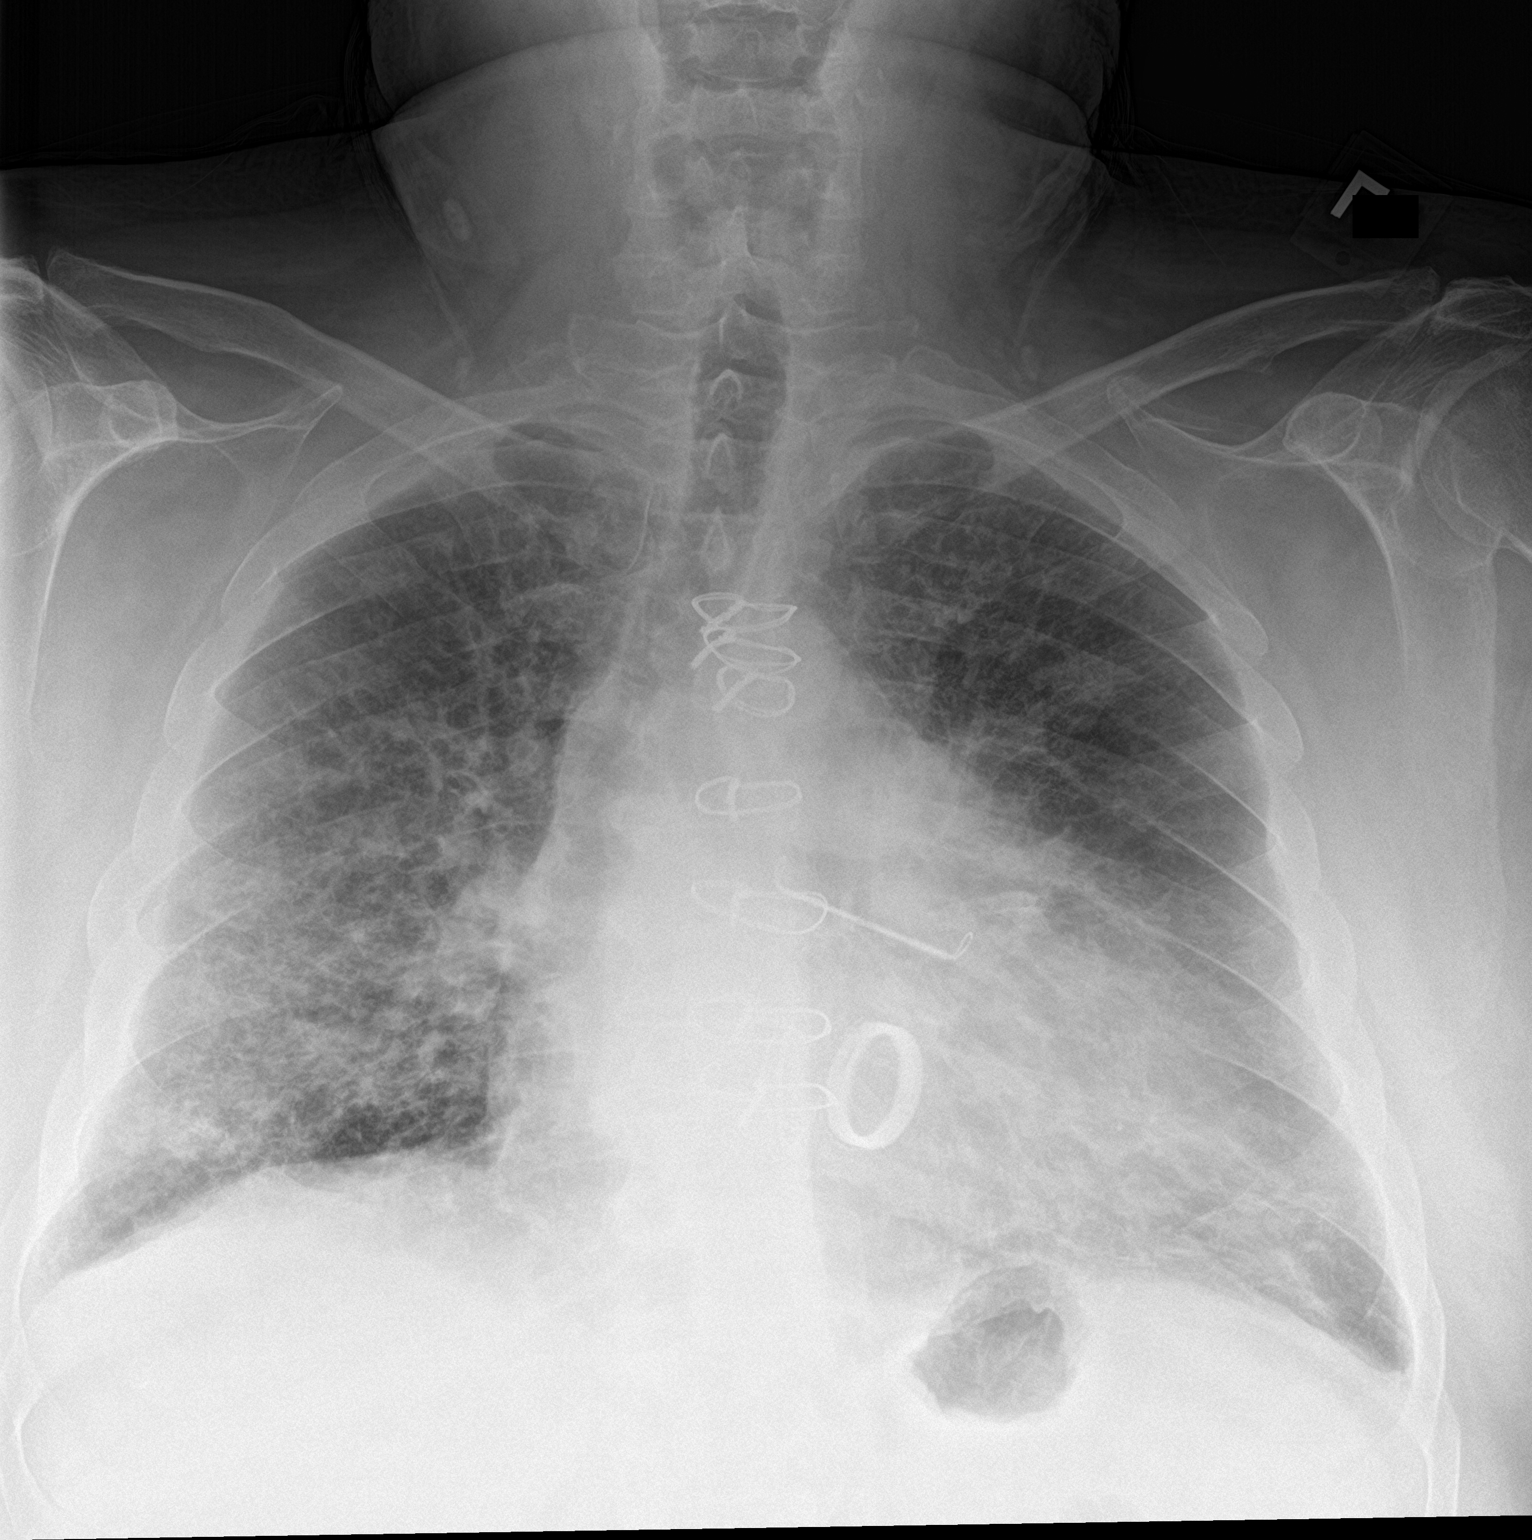

[chest lat]
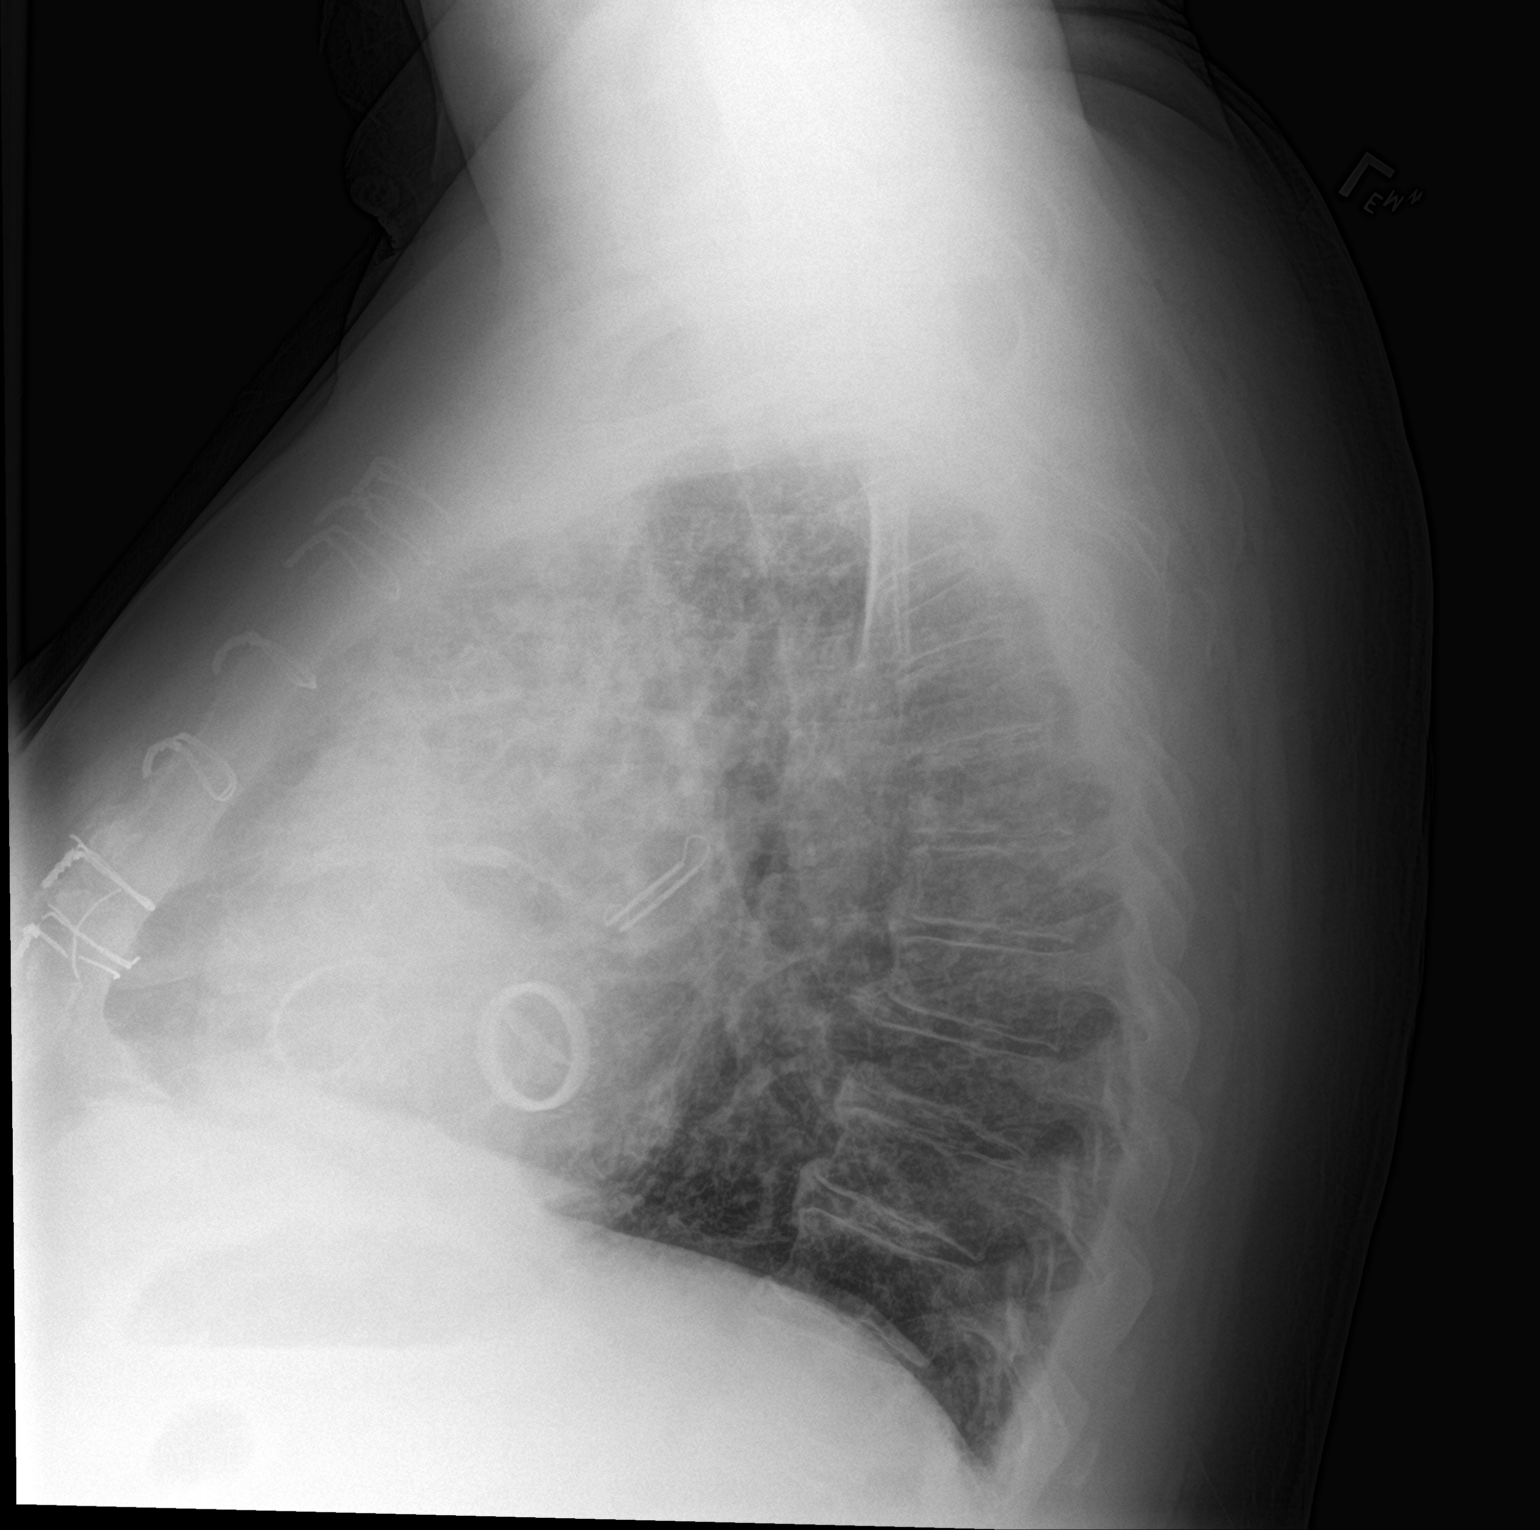

[2 of 2 positions shown; findings below may reference images not displayed]

FINDINGS: Stable cardiomegaly. Status post cardiac valve repair. No
pneumothorax or pleural effusion is noted. Stable interstitial
densities are noted throughout both lungs consistent with fibrosis
or scarring. Acute superimposed edema or inflammation cannot be
excluded. Bony thorax is unremarkable.
IMPRESSION: Stable interstitial densities are noted bilaterally consistent with
fibrosis or scarring. Acute superimposed edema or inflammation
cannot be excluded.

## 2020-04-18 ENCOUNTER — Ambulatory Visit (INDEPENDENT_AMBULATORY_CARE_PROVIDER_SITE_OTHER): Payer: 59 | Admitting: *Deleted

## 2020-04-18 ENCOUNTER — Other Ambulatory Visit: Payer: Self-pay

## 2020-04-18 DIAGNOSIS — Z954 Presence of other heart-valve replacement: Secondary | ICD-10-CM

## 2020-04-18 DIAGNOSIS — Z5181 Encounter for therapeutic drug level monitoring: Secondary | ICD-10-CM | POA: Diagnosis not present

## 2020-04-18 DIAGNOSIS — I4891 Unspecified atrial fibrillation: Secondary | ICD-10-CM

## 2020-04-18 LAB — POCT INR: INR: 2.9 (ref 2.0–3.0)

## 2020-04-18 NOTE — Patient Instructions (Signed)
Continue warfarin 1 tablet daily  Recheck INR in 6 wks.  

## 2020-04-21 ENCOUNTER — Encounter: Payer: Self-pay | Admitting: Medical-Surgical

## 2020-04-21 ENCOUNTER — Ambulatory Visit (INDEPENDENT_AMBULATORY_CARE_PROVIDER_SITE_OTHER): Payer: 59 | Admitting: Medical-Surgical

## 2020-04-21 VITALS — BP 102/67 | HR 73 | Temp 98.2°F | Ht 71.0 in | Wt 295.2 lb

## 2020-04-21 DIAGNOSIS — Z23 Encounter for immunization: Secondary | ICD-10-CM | POA: Diagnosis not present

## 2020-04-21 DIAGNOSIS — S91332A Puncture wound without foreign body, left foot, initial encounter: Secondary | ICD-10-CM | POA: Diagnosis not present

## 2020-04-21 NOTE — Progress Notes (Signed)
Subjective:    CC: Stepped on a rusty nail  HPI: Pleasant 64 year old male presenting today with reports of stepping on a rusty nail in his barn this morning.  He reports the puncture wound bled for a few minutes and then quit.  No foreign body in the wound.  No other injury.  Reports he came because he figured he would need a tetanus shot.  Denies fever, chills, and other concerns.  Only mild tenderness at the puncture site.  Not interfering with ambulating.  I reviewed the past medical history, family history, social history, surgical history, and allergies today and no changes were needed.  Please see the problem list section below in epic for further details.  Past Medical History: Past Medical History:  Diagnosis Date  . Chronic combined systolic and diastolic congestive heart failure ([place])   . COPD with chronic bronchitis ([place])   . Coronary artery disease   . Essential hypertension, benign    Ramipril to losartan Sept 2015   . History of pneumonia    RML on CXR 07/20/14  . Hyperlipidemia   . Longstanding persistent atrial fibrillation ([place])   . Mitral stenosis with regurgitation   . Myocardial infarction ([place])   . Pulmonary hypertension ([place])   . Renal disorder    history kidney stone  . S/P Maze operation for atrial fibrillation 07/18/2018   Complete bilateral atrial lesion set using bipolar radiofrequency and cryothermy ablation with clipping of LA appendage  . S/P mitral valve replacement with metallic valve 0000000   Sorin Carbomedics Optiform bileaflet mechanical valve, size 33 mm  . S/P TVR (tricuspid valve repair) 07/18/2018   Edwards mc3 ring annuloplasty, size 30 mm   Past Surgical History: Past Surgical History:  Procedure Laterality Date  . APPENDECTOMY    . CARDIOVASCULAR STRESS TEST  03/2014   Borderline reversible ischemic changes at the apex.  Normal LV contractility/EF 52%.  . CORONARY STENT PLACEMENT  7/12013  . LEFT HEART CATHETERIZATION WITH CORONARY  ANGIOGRAM N/A 06/12/2012   Procedure: LEFT HEART CATHETERIZATION WITH CORONARY ANGIOGRAM;  Surgeon: Clent Demark, MD;  Location: [facility] CATH LAB;  Service: Cardiovascular;  Laterality: N/A;  . MAZE N/A 07/18/2018   Procedure: MAZE;  Surgeon: Rexene Alberts, MD;  Location: [place];  Service: Open Heart Surgery;  Laterality: N/A;  . MITRAL VALVE REPLACEMENT N/A 07/18/2018   Procedure: MITRAL VALVE (MV) REPLACEMENT WITH CARBOMEDICS OPTIFORM MITRAL VALVE SIZE 33.;  Surgeon: Rexene Alberts, MD;  Location: [place];  Service: Open Heart Surgery;  Laterality: N/A;  . PERCUTANEOUS CORONARY STENT INTERVENTION (PCI-S) N/A 06/17/2012   Procedure: PERCUTANEOUS CORONARY STENT INTERVENTION (PCI-S);  Surgeon: Clent Demark, MD;  Location: [facility] CATH LAB;  Service: Cardiovascular;  Laterality: N/A;  . RIGHT/LEFT HEART CATH AND CORONARY ANGIOGRAPHY N/A 06/10/2018   Procedure: RIGHT/LEFT HEART CATH AND CORONARY ANGIOGRAPHY;  Surgeon: Dixie Dials, MD;  Location: [place] CV LAB;  Service: Cardiovascular;  Laterality: N/A;  . TEE WITHOUT CARDIOVERSION N/A 06/10/2018   Procedure: TRANSESOPHAGEAL ECHOCARDIOGRAM (TEE) with bubble study;  Surgeon: Dixie Dials, MD;  Location: [facility] ENDOSCOPY;  Service: Cardiovascular;  Laterality: N/A;  . TEE WITHOUT CARDIOVERSION N/A 07/18/2018   Procedure: TRANSESOPHAGEAL ECHOCARDIOGRAM (TEE);  Surgeon: Rexene Alberts, MD;  Location: [place];  Service: Open Heart Surgery;  Laterality: N/A;  . TRICUSPID VALVE REPLACEMENT N/A 07/18/2018   Procedure: TRICUSPID VALVE REPAIR WITH EDWARDS MC3 TRICUSPID ANNULOPLASTY RING SIZE 30.;  Surgeon: Rexene Alberts, MD;  Location[facility]  [facility];  Service: Open Heart Surgery;  Laterality: N/A;   Social History: Social History   Socioeconomic History  . Marital status: Married    Spouse name: Not on file  . Number of children: Not on file  . Years of education: Not on file  . Highest education level: Not on file  Occupational History  . Not on file  Tobacco  Use  . Smoking status: Former Smoker    Packs/day: 2.00    Years: 15.00    Pack years: 30.00    Types: Cigarettes    Quit date: 11/26/1996    Years since quitting: 23.4  . Smokeless tobacco: Never Used  Substance and Sexual Activity  . Alcohol use: No  . Drug use: No  . Sexual activity: Yes  Other Topics Concern  . Not on file  Social History Narrative  . Not on file   Social Determinants of Health   Financial Resource Strain:   . Difficulty of Paying Living Expenses:   Food Insecurity:   . Worried About Charity fundraiser in the Last Year:   . Arboriculturist in the Last Year:   Transportation Needs:   . Film/video editor (Medical):   Marland Kitchen Lack of Transportation (Non-Medical):   Physical Activity:   . Days of Exercise per Week:   . Minutes of Exercise per Session:   Stress:   . Feeling of Stress :   Social Connections:   . Frequency of Communication with Friends and Family:   . Frequency of Social Gatherings with Friends and Family:   . Attends Religious Services:   . Active Member of Clubs or Organizations:   . Attends Archivist Meetings:   Marland Kitchen Marital Status:    Family History: Family History  Problem Relation Age of Onset  . Heart disease Mother   . Heart disease Father    Allergies: Allergies  Allergen Reactions  . Ramipril Cough    cough  . Percocet [Oxycodone-Acetaminophen]     In ICU it contributed to hallucinations. Can take by itself   Medications: See med rec.  Review of Systems: No fevers, chills, night sweats, weight loss, chest pain, or shortness of breath.   Objective:    General: Well Developed, well nourished, and in no acute distress.  Neuro: Alert and oriented x3.  HEENT: Normocephalic, atraumatic.  Skin: Warm and dry.  Mildly tender 2 mm scabbed puncture wound to the center of the ball of the left foot with mild 0.5 cm circumscribed erythema.  No drainage or significant edema noted.  No foreign body visible or palpable in  the wound. Cardiac: Regular rate and rhythm, no murmurs rubs or gallops, no lower extremity edema.  Respiratory: Clear to auscultation bilaterally. Not using accessory muscles, speaking in full sentences.   Impression and Recommendations:    1. Puncture wound of left foot, initial encounter Only a few weeks from the 5-year mark since his last Tdap so updating his tetanus today.  Doxycycline 100 mg twice daily x5 days.  Advised patient to cleanse wound daily with an antibacterial soap and water.  Keep wound clean and dry and monitor for signs of infection. - Tdap vaccine greater than or equal to 7yo IM  Return if symptoms worsen or fail to improve. ___________________________________________ Clearnce Sorrel, DNP, APRN, FNP-BC Primary Care and [place]

## 2020-05-10 ENCOUNTER — Other Ambulatory Visit (HOSPITAL_COMMUNITY): Payer: Self-pay | Admitting: Internal Medicine

## 2020-05-11 ENCOUNTER — Other Ambulatory Visit: Payer: Self-pay | Admitting: Cardiovascular Disease

## 2020-05-11 MED ORDER — ENTRESTO 24-26 MG PO TABS: 1.0000 | ORAL_TABLET | Freq: Two times a day (BID) | ORAL | 1 refills | Status: DC

## 2020-05-11 NOTE — Telephone Encounter (Signed)
Patient states they called last week to get a refill on his Entresto.  He has been out of his medication for 3 days.   Patient uses [facility] in [place].

## 2020-05-11 NOTE — Telephone Encounter (Signed)
Refill complete 

## 2020-05-17 ENCOUNTER — Ambulatory Visit: Payer: BC Managed Care – PPO | Admitting: Cardiovascular Disease

## 2020-05-31 ENCOUNTER — Ambulatory Visit (INDEPENDENT_AMBULATORY_CARE_PROVIDER_SITE_OTHER): Payer: 59 | Admitting: Family Medicine

## 2020-05-31 ENCOUNTER — Ambulatory Visit (INDEPENDENT_AMBULATORY_CARE_PROVIDER_SITE_OTHER): Payer: 59 | Admitting: *Deleted

## 2020-05-31 ENCOUNTER — Encounter: Payer: Self-pay | Admitting: Family Medicine

## 2020-05-31 ENCOUNTER — Other Ambulatory Visit: Payer: Self-pay

## 2020-05-31 DIAGNOSIS — I4891 Unspecified atrial fibrillation: Secondary | ICD-10-CM

## 2020-05-31 DIAGNOSIS — Z5181 Encounter for therapeutic drug level monitoring: Secondary | ICD-10-CM | POA: Diagnosis not present

## 2020-05-31 DIAGNOSIS — Z954 Presence of other heart-valve replacement: Secondary | ICD-10-CM

## 2020-05-31 DIAGNOSIS — M7750 Other enthesopathy of unspecified foot: Secondary | ICD-10-CM | POA: Insufficient documentation

## 2020-05-31 LAB — POCT INR: INR: 2.2 (ref 2.0–3.0)

## 2020-05-31 MED ORDER — DICLOFENAC SODIUM 1 % EX GEL: CUTANEOUS | 0 refills | Status: DC

## 2020-05-31 NOTE — Patient Instructions (Signed)
Take 1 1/2 tablets tonight and tomorrow night then resume 1 tablet daily. Recheck  INR in 6 wks

## 2020-05-31 NOTE — Patient Instructions (Signed)
[facility] Tend[facility] Ask your health care provider which exercises are safe for you. Do exercises exactly as told by your health care provider and adjust them as directed. It is normal to feel mild stretching, pulling, tightness, or discomfort as you do these exercises. Stop right away if you feel sudden pain or your pain gets worse. Do not begin these exercises until told by your health care provider. Stretching and range-of-motion exercises These exercises warm up your muscles and joints and improve the movement and flexibility of your ankle. These exercises also help to relieve pain. Standing wall calf stretch with straight knee  1. Stand with your hands against a wall. 2. Extend your left / right leg behind you, and bend your front knee slightly. Keep both of your heels on the floor. 3. Point the toes of your back foot slightly inward. 4. Keeping your heels on the floor and your back knee straight, shift your weight toward the wall. Do not allow your back to arch. You should feel a gentle stretch in your upper calf. 5. Hold this position for __________ seconds. Repeat __________ times. Complete this exercise __________ times a day. Standing wall calf stretch with bent knee 1. Stand with your hands against a wall. 2. Extend your left / right leg behind you, and bend your front knee slightly. Keep both of your heels on the floor. 3. Point the toes of your back foot slightly inward. 4. Keeping your heels on the floor, bend your back knee slightly. You should feel a gentle stretch deep in your lower calf near your heel. 5. Hold this position for __________ seconds. Repeat __________ times. Complete this exercise __________ times a day. Strengthening exercises These exercises build strength and control of your ankle. Endurance is the ability to use your muscles for a long time, even after they get tired. Plantar flexion with band In this exercise, you push your toes downward, away from you,  with an exercise band providing resistance. 1. Sit on the floor with your left / right leg extended. You may put a pillow under your calf to give your foot more room to move. 2. Loop a rubber exercise band or tube around the ball of your left / right foot. The ball of your foot is on the walking surface, right under your toes. The band or tube should be slightly tense when your foot is relaxed. If the band or tube slips, you can put on your shoe or put a washcloth between the band and your foot to help it stay in place. 3. Slowly point your toes downward, pushing them away from you (plantar flexion). 4. Hold this position for __________ seconds. 5. Slowly release the tension in the band or tube, controlling smoothly until your foot is back to the starting position. 6. Repeat steps 1-5 with your left / right leg. Repeat __________ times. Complete this exercise __________ times a day. Eccentric heel drop  In this exercise, you stand and slowly raise your heel and then slowly lower it. This exercise lengthens the calf muscles (eccentric) while the heel bears weight. If this exercise is too easy, try doing it while wearing a backpack with weights in it. 1. Stand on a step with the balls of your feet. The ball of your foot is on the walking surface, right under your toes. ? Do not put your heels on the step. ? For balance, rest your hands on the wall or on a railing. 2. Rise up onto   the balls of your feet. 3. Keeping your heels up, shift all of your weight to your left / right leg and pick up your other leg. 4. Slowly lower your left / right leg so your heel drops below the level of the step. 5. Put down your other foot before returning to the start position. If told by your health care provider, build up to: ? 3 sets of 15 repetitions while keeping your knees straight. ? 3 sets of 15 repetitions while keeping your knees slightly bent as far as told by your health care provider. Repeat __________  times. Complete this exercise __________ times a day. Balance exercises These exercises improve or maintain your balance. Balance is important in preventing falls. Single leg stand If this exercise is too easy, you can try it with your eyes closed or while standing on a pillow. 1. Without shoes, stand near a railing or in a door frame. Hold on to the railing or door frame as needed. 2. Stand on your left / right foot. Keep your big toe down on the floor and try to keep your arch lifted. 3. Hold this position for __________ seconds. Repeat __________ times. Complete this exercise __________ times a day. This information is not intended to replace advice given to you by your health care provider. Make sure you discuss any questions you have with your health care provider. Document Revised: 03/02/2019 Document Reviewed: 08/25/2018 Elsevier Patient Education  2020 [employer]   

## 2020-05-31 NOTE — Progress Notes (Signed)
Jeffrey Larson - 64 y.o. male MRN [id number]  Date of birth: Mar 09, 1956  Subjective Chief Complaint  Patient presents with  . Foot Pain    HPI RONNALD Larson is a 64 y.o. male here today with complaint of R posterior heel pain.  He reports that pain started about 1 week ago.  Denies any known injury to foot or ankle.  Thought this may be gout.  Denies swelling, redness or warmth.  He has had this in the past and resolved spontaneously.  Pain is worse with walking and plantar flexion.  He has not tried anything to help with this.   ROS:  A comprehensive ROS was completed and negative except as noted per HPI   Allergies  Allergen Reactions  . Ramipril Cough    cough  . Percocet [Oxycodone-Acetaminophen]     In ICU it contributed to hallucinations. Can take by itself    Past Medical History:  Diagnosis Date  . Chronic combined systolic and diastolic congestive heart failure ([place])   . COPD with chronic bronchitis ([place])   . Coronary artery disease   . Essential hypertension, benign    Ramipril to losartan Sept 2015   . History of pneumonia    RML on CXR 07/20/14  . Hyperlipidemia   . Longstanding persistent atrial fibrillation ([place])   . Mitral stenosis with regurgitation   . Myocardial infarction ([place])   . Pulmonary hypertension ([place])   . Renal disorder    history kidney stone  . S/P Maze operation for atrial fibrillation 07/18/2018   Complete bilateral atrial lesion set using bipolar radiofrequency and cryothermy ablation with clipping of LA appendage  . S/P mitral valve replacement with metallic valve 1/60/7371   Sorin Carbomedics Optiform bileaflet mechanical valve, size 33 mm  . S/P TVR (tricuspid valve repair) 07/18/2018   Edwards mc3 ring annuloplasty, size 30 mm    Past Surgical History:  Procedure Laterality Date  . APPENDECTOMY    . CARDIOVASCULAR STRESS TEST  03/2014   Borderline reversible ischemic changes at the apex.  Normal LV contractility/EF 52%.  .  CORONARY STENT PLACEMENT  7/12013  . LEFT HEART CATHETERIZATION WITH CORONARY ANGIOGRAM N/A 06/12/2012   Procedure: LEFT HEART CATHETERIZATION WITH CORONARY ANGIOGRAM;  Surgeon: Clent Demark, MD;  Location: [facility] CATH LAB;  Service: Cardiovascular;  Laterality: N/A;  . MAZE N/A 07/18/2018   Procedure: MAZE;  Surgeon: Rexene Alberts, MD;  Location: [place];  Service: Open Heart Surgery;  Laterality: N/A;  . MITRAL VALVE REPLACEMENT N/A 07/18/2018   Procedure: MITRAL VALVE (MV) REPLACEMENT WITH CARBOMEDICS OPTIFORM MITRAL VALVE SIZE 33.;  Surgeon: Rexene Alberts, MD;  Location: [place];  Service: Open Heart Surgery;  Laterality: N/A;  . PERCUTANEOUS CORONARY STENT INTERVENTION (PCI-S) N/A 06/17/2012   Procedure: PERCUTANEOUS CORONARY STENT INTERVENTION (PCI-S);  Surgeon: Clent Demark, MD;  Location: [facility] CATH LAB;  Service: Cardiovascular;  Laterality: N/A;  . RIGHT/LEFT HEART CATH AND CORONARY ANGIOGRAPHY N/A 06/10/2018   Procedure: RIGHT/LEFT HEART CATH AND CORONARY ANGIOGRAPHY;  Surgeon: Dixie Dials, MD;  Location: [place] CV LAB;  Service: Cardiovascular;  Laterality: N/A;  . TEE WITHOUT CARDIOVERSION N/A 06/10/2018   Procedure: TRANSESOPHAGEAL ECHOCARDIOGRAM (TEE) with bubble study;  Surgeon: Dixie Dials, MD;  Location: [facility] ENDOSCOPY;  Service: Cardiovascular;  Laterality: N/A;  . TEE WITHOUT CARDIOVERSION N/A 07/18/2018   Procedure: TRANSESOPHAGEAL ECHOCARDIOGRAM (TEE);  Surgeon: Rexene Alberts, MD;  Location: [place];  Service: Open Heart Surgery;  Laterality:  N/A;  . TRICUSPID VALVE REPLACEMENT N/A 07/18/2018   Procedure: TRICUSPID VALVE REPAIR WITH EDWARDS MC3 TRICUSPID ANNULOPLASTY RING SIZE 30.;  Surgeon: Rexene Alberts, MD;  Location: [place];  Service: Open Heart Surgery;  Laterality: N/A;    Social History   Socioeconomic History  . Marital status: Married    Spouse name: Not on file  . Number of children: Not on file  . Years of education: Not on file  . Highest education  level: Not on file  Occupational History  . Not on file  Tobacco Use  . Smoking status: Former Smoker    Packs/day: 2.00    Years: 15.00    Pack years: 30.00    Types: Cigarettes    Quit date: 11/26/1996    Years since quitting: 23.5  . Smokeless tobacco: Never Used  Vaping Use  . Vaping Use: Never used  Substance and Sexual Activity  . Alcohol use: No  . Drug use: No  . Sexual activity: Yes  Other Topics Concern  . Not on file  Social History Narrative  . Not on file   Social Determinants of Health   Financial Resource Strain:   . Difficulty of Paying Living Expenses:   Food Insecurity:   . Worried About Charity fundraiser in the Last Year:   . Arboriculturist in the Last Year:   Transportation Needs:   . Film/video editor (Medical):   Marland Kitchen Lack of Transportation (Non-Medical):   Physical Activity:   . Days of Exercise per Week:   . Minutes of Exercise per Session:   Stress:   . Feeling of Stress :   Social Connections:   . Frequency of Communication with Friends and Family:   . Frequency of Social Gatherings with Friends and Family:   . Attends Religious Services:   . Active Member of Clubs or Organizations:   . Attends Archivist Meetings:   Marland Kitchen Marital Status:     Family History  Problem Relation Age of Onset  . Heart disease Mother   . Heart disease Father     Health Maintenance  Topic Date Due  . URINE MICROALBUMIN  Never done  . OPHTHALMOLOGY EXAM  06/05/2017  . FOOT EXAM  11/28/2018  . HEMOGLOBIN A1C  05/10/2020  . PNEUMOCOCCAL POLYSACCHARIDE VACCINE AGE 70-64 HIGH RISK  04/21/2021 (Originally 09/10/1958)  . Fecal DNA (Cologuard)  04/21/2021 (Originally 03/04/2020)  . INFLUENZA VACCINE  06/26/2020  . TETANUS/TDAP  04/21/2030  . COVID-19 Vaccine  Completed  . Hepatitis C Screening  Completed  . HIV Screening  Completed      ----------------------------------------------------------------------------------------------------------------------------------------------------------------------------------------------------------------- Physical Exam BP 117/68 (BP Location: Left Arm, Patient Position: Sitting, Cuff Size: Large)   Pulse 65   Wt 294 lb 9.6 oz (133.6 kg)   SpO2 97%   BMI 41.09 kg/m   Physical Exam Constitutional:      Appearance: Normal appearance.  HENT:     Head: Normocephalic and atraumatic.  Musculoskeletal:     Cervical back: Neck supple.     Comments: TTP along distal achilles at insertion into calcaneus.  No significant swelling, redness or warmth noted.   Skin:    General: Skin is warm and dry.  Neurological:     General: No focal deficit present.     Mental Status: He is alert.     ------------------------------------------------------------------------------------------------------------------------------------------------------------------------------------------------------------------- Assessment and Plan  Enthesopathy of ankle Trial of topical voltaren gel and given handout for stretches/exercise to  try at home.  Will avoid oral NSAIDS due to warfarin use.  NTG patches possibly an option as well if not improving He will follow up in 3-4 weeks if not seeing any improvement.    Meds ordered this encounter  Medications  . diclofenac Sodium (VOLTAREN) 1 % GEL    Sig: Apply 4 grams to back of heel 3-4x per day.    Dispense:  100 g    Refill:  0    No follow-ups on file.    This visit occurred during the SARS-CoV-2 public health emergency.  Safety protocols were in place, including screening questions prior to the visit, additional usage of staff PPE, and extensive cleaning of exam room while observing appropriate contact time as indicated for disinfecting solutions.

## 2020-05-31 NOTE — Assessment & Plan Note (Signed)
Trial of topical voltaren gel and given handout for stretches/exercise to try at home.  Will avoid oral NSAIDS due to warfarin use.  NTG patches possibly an option as well if not improving He will follow up in 3-4 weeks if not seeing any improvement.

## 2020-06-03 ENCOUNTER — Ambulatory Visit: Payer: 59 | Admitting: Cardiovascular Disease

## 2020-06-09 ENCOUNTER — Encounter: Payer: 59 | Admitting: Osteopathic Medicine

## 2020-06-17 ENCOUNTER — Other Ambulatory Visit: Payer: Self-pay

## 2020-06-17 ENCOUNTER — Encounter: Payer: Self-pay | Admitting: Physician Assistant

## 2020-06-17 ENCOUNTER — Ambulatory Visit (INDEPENDENT_AMBULATORY_CARE_PROVIDER_SITE_OTHER): Payer: 59 | Admitting: Physician Assistant

## 2020-06-17 VITALS — BP 114/69 | HR 62 | Ht 71.0 in | Wt 301.0 lb

## 2020-06-17 DIAGNOSIS — Z9889 Other specified postprocedural states: Secondary | ICD-10-CM

## 2020-06-17 DIAGNOSIS — I498 Other specified cardiac arrhythmias: Secondary | ICD-10-CM

## 2020-06-17 DIAGNOSIS — Z954 Presence of other heart-valve replacement: Secondary | ICD-10-CM | POA: Diagnosis not present

## 2020-06-17 DIAGNOSIS — I5032 Chronic diastolic (congestive) heart failure: Secondary | ICD-10-CM | POA: Diagnosis not present

## 2020-06-17 DIAGNOSIS — I2510 Atherosclerotic heart disease of native coronary artery without angina pectoris: Secondary | ICD-10-CM | POA: Diagnosis not present

## 2020-06-17 DIAGNOSIS — E785 Hyperlipidemia, unspecified: Secondary | ICD-10-CM

## 2020-06-17 MED ORDER — METOPROLOL SUCCINATE ER 25 MG PO TB24
25.0000 mg | ORAL_TABLET | Freq: Every day | ORAL | 3 refills | Status: DC
Start: 2020-06-17 — End: 2021-06-05

## 2020-06-17 MED ORDER — ENTRESTO 24-26 MG PO TABS: 1.0000 | ORAL_TABLET | Freq: Two times a day (BID) | ORAL | 6 refills | Status: DC

## 2020-06-17 NOTE — Patient Instructions (Addendum)
Medication Instructions:  Your physician recommends that you continue on your current medications as directed. Please refer to the Current Medication list given to you today.  Decrease Toprol XL to 25 mg Daily   *If you need a refill on your cardiac medications before your next appointment, please call your pharmacy*   Lab Work: Have labs done fasting before next PCP Appointment   If you have labs (blood work) drawn today and your tests are completely normal, you will receive your results only by: Marland Kitchen MyChart Message (if you have MyChart) OR . A paper copy in the mail If you have any lab test that is abnormal or we need to change your treatment, we will call you to review the results.   Testing/Procedures: NONE    Follow-Up: At [facility], you and your health needs are our priority.  As part of our continuing mission to provide you with exceptional heart care, we have created designated Provider Care Teams.  These Care Teams include your primary Cardiologist (physician) and Advanced Practice Providers (APPs -  Physician Assistants and Nurse Practitioners) who all work together to provide you with the care you need, when you need it.  We recommend signing up for the patient portal called "MyChart".  Sign up information is provided on this After Visit Summary.  MyChart is used to connect with patients for Virtual Visits (Telemedicine).  Patients are able to view lab/test results, encounter notes, upcoming appointments, etc.  Non-urgent messages can be sent to your provider as well.   To learn more about what you can do with MyChart, go to [email or web address].    Your next appointment:   6 month(s)  The format for your next appointment:   In Person  Provider:   Carlyle Dolly, MD   Other Instructions Thank you for choosing [facility]!   Your physician recommends that you weigh, daily, at the same time every day, and in the same amount of clothing. Please record  your daily weights on the handout provided and bring it to your next appointment.

## 2020-06-17 NOTE — Progress Notes (Signed)
Cardiology Office Note   Date:  06/17/2020   ID:  Jeffrey Larson, DOB 1956/06/17, MRN [id number]  PCP:  [name], DO Cardiologist:  Kate Sable, MD (Inactive) 11/10/2019 televisit Electrphysiologist: None Rosaria Ferries, PA-C    History of Present Illness: Jeffrey Larson is a 64 y.o. male with a history of obesity, COPD, permanent Afib, rheumatic mitral stenosis s/p mech MVR, tricuspid regurgitation s/p repair, and chronic S-D-CHF. MI 2013 s/p DES LAD, CFX & RCA, cath 2019 with nonobstructive disease, mild OSA, CPAP not recommended, COVID w/ subsequent home O2 x 2 months  Jeffrey Larson presents for cardiology follow up.   He is doing much better, finally got off the oxygen.   He does not feel the atrial fib. No presyncope or syncope.   He is compliant with coumadin checks. No bleeding issues.  No CHF sx. He denies orthopnea or PND. Wife says he is snoring less. No LE edema.   He is going on a diet. He will be changing his eating habits for the better.   Aware that his weight is up today. He has not had his Lasix today yet because of running errands.   He is trying to increase his activity.  He has not had palpitations, no presyncope or syncope.   He has appt w/ his PCP next week, is to get yearly labs done then.    Past Medical History:  Diagnosis Date  . Chronic combined systolic and diastolic congestive heart failure ([place])   . COPD with chronic bronchitis ([place])   . Coronary artery disease   . Essential hypertension, benign    Ramipril to losartan Sept 2015   . History of pneumonia    RML on CXR 07/20/14  . Hyperlipidemia   . Longstanding persistent atrial fibrillation ([place])   . Mitral stenosis with regurgitation   . Myocardial infarction ([place])   . Pulmonary hypertension ([place])   . Renal disorder    history [name]  . S/P Maze operation for atrial fibrillation 07/18/2018   Complete bilateral atrial lesion set using bipolar  radiofrequency and cryothermy ablation with clipping of LA appendage  . S/P mitral valve replacement with metallic valve 3/76/2831   Sorin Carbomedics Optiform bileaflet mechanical valve, size 33 mm  . S/P TVR (tricuspid valve repair) 07/18/2018   Edwards mc3 ring annuloplasty, size 30 mm    Past Surgical History:  Procedure Laterality Date  . APPENDECTOMY    . CARDIOVASCULAR STRESS TEST  03/2014   Borderline reversible ischemic changes at the apex.  Normal LV contractility/EF 52%.  . CORONARY STENT PLACEMENT  7/12013  . LEFT HEART CATHETERIZATION WITH CORONARY ANGIOGRAM N/A 06/12/2012   Procedure: LEFT HEART CATHETERIZATION WITH CORONARY ANGIOGRAM;  Surgeon: Clent Demark, MD;  Location: [facility] CATH LAB;  Service: Cardiovascular;  Laterality: N/A;  . MAZE N/A 07/18/2018   Procedure: MAZE;  Surgeon: Rexene Alberts, MD;  Location: [place] [employer] [facility];  Service: Open Heart Surgery;  Laterality: N/A;  . MITRAL VALVE REPLACEMENT N/A 07/18/2018   Procedure: MITRAL VALVE (MV) REPLACEMENT WITH CARBOMEDICS OPTIFORM MITRAL VALVE SIZE 33.;  Surgeon: Rexene Alberts, MD;  Location: [place];  Service: Open Heart Surgery;  Laterality: N/A;  . PERCUTANEOUS CORONARY STENT INTERVENTION (PCI-S) N/A 06/17/2012   Procedure: PERCUTANEOUS CORONARY STENT INTERVENTION (PCI-S);  Surgeon: Clent Demark, MD;  Location[facility] CATH LAB;  Service: Cardiovascular;  Laterality: N/A;  . RIGHT/LEFT HEART CATH AND CORONARY ANGIOGRAPHY N/A 06/10/2018   Procedure:  RIGHT/LEFT HEART CATH AND CORONARY ANGIOGRAPHY;  Surgeon: Dixie Dials, MD;  Location: [place] CV LAB;  Service: Cardiovascular;  Laterality: N/A;  . TEE WITHOUT CARDIOVERSION N/A 06/10/2018   Procedure: TRANSESOPHAGEAL ECHOCARDIOGRAM (TEE) with bubble study;  Surgeon: Dixie Dials, MD;  Location: [facility] ENDOSCOPY;  Service: Cardiovascular;  Laterality: N/A;  . TEE WITHOUT CARDIOVERSION N/A 07/18/2018   Procedure: TRANSESOPHAGEAL ECHOCARDIOGRAM (TEE);  Surgeon: Rexene Alberts, MD;   Location: [place];  Service: Open Heart Surgery;  Laterality: N/A;  . TRICUSPID VALVE REPLACEMENT N/A 07/18/2018   Procedure: TRICUSPID VALVE REPAIR WITH EDWARDS MC3 TRICUSPID ANNULOPLASTY RING SIZE 30.;  Surgeon: Rexene Alberts, MD;  Location: [place];  Service: Open Heart Surgery;  Laterality: N/A;    Current Outpatient Medications  Medication Sig Dispense Refill  . acetaminophen (TYLENOL) 500 MG tablet Take 1,000 mg by mouth 2 (two) times daily.    Marland Kitchen aspirin 81 MG tablet Take 81 mg by mouth daily.    Marland Kitchen atorvastatin (LIPITOR) 40 MG tablet Take 1 tablet (40 mg total) by mouth every evening. 90 tablet 3  . dexamethasone (DECADRON) 4 MG tablet Take 1 tablet (4 mg total) by mouth 2 (two) times daily with a meal. 10 tablet 0  . diclofenac Sodium (VOLTAREN) 1 % GEL Apply 4 grams to back of heel 3-4x per day. 100 g 0  . Fluticasone-Umeclidin-Vilant (TRELEGY ELLIPTA) 100-62.5-25 MCG/INH AEPB Inhale 1 puff into the lungs daily. 120 each 99  . metoprolol succinate (TOPROL-XL) 50 MG 24 hr tablet Take 1 tablet (50 mg total) by mouth daily. Take with or immediately following a meal. 30 tablet 3  . nitroGLYCERIN (NITROSTAT) 0.4 MG SL tablet Place 1 tablet (0.4 mg total) under the tongue every 5 (five) minutes x 3 doses as needed for chest pain. 25 tablet 3  . Omega-3 Fatty Acids (FISH OIL) 1200 MG CAPS Take 1,200 mg by mouth daily.    . sacubitril-valsartan (ENTRESTO) 24-26 MG Take 1 tablet by mouth 2 (two) times daily. Needs appointment for more refills. 60 tablet 1  . Semaglutide, 1 MG/DOSE, (OZEMPIC, 1 MG/DOSE,) 2 MG/1.5ML SOPN Inject 1 mg into the skin once a week. 5 pen 11  . sildenafil (REVATIO) 20 MG tablet Take 1 tablet (20 mg total) by mouth 3 (three) times daily. 270 tablet 3  . spironolactone (ALDACTONE) 25 MG tablet TAKE 1 TABLET(25 MG) BY MOUTH DAILY 30 tablet 6  . torsemide (DEMADEX) 20 MG tablet Take 2 tablets (40 mg total) by mouth daily. 60 tablet 11  . traMADol (ULTRAM) 50 MG tablet Take 1  tablet (50 mg total) by mouth every 8 (eight) hours as needed for moderate pain. 30 tablet 0  . warfarin (COUMADIN) 4 MG tablet Take 4 mg daily 40 tablet 4   No current facility-administered medications for this visit.    Allergies:   Ramipril and Percocet [oxycodone-acetaminophen]    Social History:  The patient  reports that he quit smoking about 23 years ago. His smoking use included cigarettes. He has a 30.00 pack-year smoking history. He has never used smokeless tobacco. He reports that he does not drink alcohol and does not use drugs.   Family History:  The patient's family history includes Heart disease in his father and mother.  He indicated that his mother is deceased. He indicated that his father is deceased.   ROS:  Please see the history of present illness. All other systems are reviewed and negative.    PHYSICAL EXAM:  VS:  BP 114/69   Pulse 62   Ht 5\' 11"  (1.803 m)   Wt (!) 301 lb (136.5 kg)   SpO2 96%   BMI 41.98 kg/m  , BMI Body mass index is 41.98 kg/m. GEN: Well nourished, obese, male in no acute distress HEENT: normal for age  Neck: no JVD seen, difficult to assess 2nd body habitus, no carotid bruit, no masses Cardiac: RRR; crisp valve click, no significant murmur, no rubs, or gallops Respiratory:  clear to auscultation bilaterally, normal work of breathing GI: soft, nontender, nondistended, + BS MS: no deformity or atrophy; no edema; distal pulses are 2+ in all 4 extremities  Skin: warm and dry, no rash Neuro:  Strength and sensation are intact Psych: euthymic mood, full affect   EKG:  EKG is ordered today. The ekg ordered today demonstrates Accelerated Junctional rhythm, HR 62, QRS morphology unchanged, inc. RBBB   ECHO: 12/01/2018 - Left ventricle: The cavity size was normal. Wall thickness was  increased in a pattern of mild LVH. Systolic function was normal.  The estimated ejection fraction was in the range of 55% to 60%.  - Aortic valve: Valve  area (VTI): 3.01 cm^2. Valve area (Vmax):  2.46 cm^2. Valve area (Vmean): 2.9 cm^2.  - Mitral valve: There is a Sorin Carbomedics Optiform bileaflet 33  mm mechanical valve in the MV position. There was no evidence for  stenosis. There was no significant regurgitation. Mean gradient  (D): 4 mm Hg. Valve area by pressure half-time: 2.06 cm^2. Valve  area by continuity equation (using LVOT flow): 1.59 cm^2.  - Left atrium: The atrium was severely dilated.  - Right ventricle: The cavity size was moderately dilated. Systolic  function was moderately reduced.  - Technically difficult study. Echoconrast was used to enhance  visualization.   CATH: 05/31/2018  Prox RCA lesion is 10% stenosed.  Mid RCA lesion is 50% stenosed.  Post Atrio lesion is 30% stenosed.  Ost LAD to Prox LAD lesion is 20% stenosed.  Prox Cx lesion is 20% stenosed.  Hemodynamic findings consistent with severe pulmonary hypertension.  LV end diastolic pressure is normal.   Severe MV calcification. MV area 0.84 cm2. TEE and CVTS consult for MV surgery. Life-style modification for CAD. Diagnostic Dominance: Right    Recent Labs: 07/17/2019: Magnesium 2.4 08/06/2019: Hemoglobin 12.9; Platelets 519 08/20/2019: ALT 23; Brain Natriuretic Peptide 45 09/10/2019: BUN 17; Creat 0.86; Potassium 4.4; Sodium 140  CBC    Component Value Date/Time   WBC 21.9 (H) 08/06/2019 1345   RBC 4.39 08/06/2019 1345   HGB 12.9 (L) 08/06/2019 1345   HCT 39.3 08/06/2019 1345   PLT 519 (H) 08/06/2019 1345   MCV 89.5 08/06/2019 1345   MCH 29.4 08/06/2019 1345   MCHC 32.8 08/06/2019 1345   RDW 16.0 (H) 08/06/2019 1345   LYMPHSABS 2,234 08/06/2019 1345   MONOABS 0.5 07/31/2019 1347   EOSABS 175 08/06/2019 1345   BASOSABS 22 08/06/2019 1345   CMP Latest Ref Rng & Units 09/10/2019 08/20/2019 08/06/2019  Glucose 65 - 99 mg/dL 114(H) 117(H) 78  BUN 7 - 25 mg/dL 17 13 34(H)  Creatinine 0.70 - 1.25 mg/dL 0.86 0.83 1.07    Sodium 135 - 146 mmol/L 140 136 140  Potassium 3.5 - 5.3 mmol/L 4.4 4.1 4.2  Chloride 98 - 110 mmol/L 104 100 101  CO2 20 - 32 mmol/L 27 28 26   Calcium 8.6 - 10.3 mg/dL 9.2 8.7 9.2  Total Protein 6.1 - 8.1  g/dL - 6.0(L) 6.3  Total Bilirubin 0.2 - 1.2 mg/dL - 1.0 1.0  Alkaline Phos 38 - 126 U/L - - -  AST 10 - 35 U/L - 21 22  ALT 9 - 46 U/L - 23 50(H)   Lab Results  Component Value Date   INR 2.2 05/31/2020   INR 2.9 04/18/2020   INR 2.3 03/07/2020    Lipid Panel Lab Results  Component Value Date   CHOL 139 12/08/2018   HDL 33 (L) 12/08/2018   LDLCALC 77 12/08/2018   TRIG 180 (H) 07/08/2019   CHOLHDL 4.2 12/08/2018      Wt Readings from Last 3 Encounters:  06/17/20 (!) 301 lb (136.5 kg)  05/31/20 294 lb 9.6 oz (133.6 kg)  04/21/20 295 lb 3.2 oz (133.9 kg)     Other studies Reviewed: Additional studies/ records that were reviewed today include: Office notes, hospital records and testing.  ASSESSMENT AND PLAN:  1.  CAD:  - continue ASA 81 mg (tolerating well in combo w/ coumadin) - continue Toprol XL, but at reduced dose - continue Lipitor, nitro prn  2. Hyperlipidemia, goal LDL < 70 - PCP has labs scheduled for next week, f/u on results - says he is making dietary changes, admits he has not been eating that well - if LDL not at goal, recheck in 12 weeks, may have to increase Lipitor  3. Chronic anticoagulation - followed by our office, if INR is again < 2.5, may need to increase dose.  4. CHF:  - EF was previously decreased, but normal at last check - continue Entresto, spiro, torsemide at current doses  5. Accelerated Junctional rhythm - ECG reviewed by Dr Harl Bowie - HR is normal, but no P waves visible - will decrease metoprolol XL to 25 mg qd  6. S/p mech MVR and tricuspid repair w/ MAZE procedure 2019 - no significant murmur, crisp valve click on exam - last echo 11/2018 - follow for symptoms  Current medicines are reviewed at length with the  patient today.  The patient does not have concerns regarding medicines.  The following changes have been made:  Decrease BB  Labs/ tests ordered today include:  No orders of the defined types were placed in this encounter.    Disposition:   FU with Kate Sable, MD (Inactive) >> Dr Harl Bowie  Signed, Rosaria Ferries, PA-C  06/17/2020 3:31 PM    [place] Phone: [phone]; Fax: [phone]

## 2020-06-21 ENCOUNTER — Telehealth: Payer: Self-pay | Admitting: Physician Assistant

## 2020-06-21 ENCOUNTER — Ambulatory Visit (INDEPENDENT_AMBULATORY_CARE_PROVIDER_SITE_OTHER): Payer: 59 | Admitting: Osteopathic Medicine

## 2020-06-21 ENCOUNTER — Encounter: Payer: Self-pay | Admitting: Osteopathic Medicine

## 2020-06-21 VITALS — BP 110/70 | HR 67 | Temp 98.0°F | Ht 71.0 in | Wt 297.0 lb

## 2020-06-21 DIAGNOSIS — Z954 Presence of other heart-valve replacement: Secondary | ICD-10-CM | POA: Diagnosis not present

## 2020-06-21 DIAGNOSIS — Z0000 Encounter for general adult medical examination without abnormal findings: Secondary | ICD-10-CM

## 2020-06-21 DIAGNOSIS — I10 Essential (primary) hypertension: Secondary | ICD-10-CM | POA: Diagnosis not present

## 2020-06-21 DIAGNOSIS — E119 Type 2 diabetes mellitus without complications: Secondary | ICD-10-CM | POA: Diagnosis not present

## 2020-06-21 DIAGNOSIS — Z1211 Encounter for screening for malignant neoplasm of colon: Secondary | ICD-10-CM

## 2020-06-21 LAB — COMPLETE METABOLIC PANEL WITH GFR
AG Ratio: 1.9 (calc) (ref 1.0–2.5)
ALT: 18 U/L (ref 9–46)
AST: 15 U/L (ref 10–35)
Albumin: 4.5 g/dL (ref 3.6–5.1)
Alkaline phosphatase (APISO): 76 U/L (ref 35–144)
BUN: 17 mg/dL (ref 7–25)
CO2: 26 mmol/L (ref 20–32)
Calcium: 9.2 mg/dL (ref 8.6–10.3)
Chloride: 105 mmol/L (ref 98–110)
Creat: 1.06 mg/dL (ref 0.70–1.25)
GFR, Est African American: 86 mL/min/{1.73_m2} (ref >=60)
GFR, Est Non African American: 74 mL/min/{1.73_m2} (ref >=60)
Globulin: 2.4 g/dL (calc) (ref 1.9–3.7)
Glucose, Bld: 105 mg/dL — ABNORMAL HIGH (ref 65–99)
Potassium: 4.3 mmol/L (ref 3.5–5.3)
Sodium: 140 mmol/L (ref 135–146)
Total Bilirubin: 0.5 mg/dL (ref 0.2–1.2)
Total Protein: 6.9 g/dL (ref 6.1–8.1)

## 2020-06-21 LAB — CBC
HCT: 40.2 % (ref 38.5–50.0)
Hemoglobin: 13.2 g/dL (ref 13.2–17.1)
MCH: 28.3 pg (ref 27.0–33.0)
MCHC: 32.8 g/dL (ref 32.0–36.0)
MCV: 86.1 fL (ref 80.0–100.0)
MPV: 10.2 fL (ref 7.5–12.5)
Platelets: 309 10*3/uL (ref 140–400)
RBC: 4.67 10*6/uL (ref 4.20–5.80)
RDW: 13.8 % (ref 11.0–15.0)
WBC: 9.2 10*3/uL (ref 3.8–10.8)

## 2020-06-21 LAB — PSA, TOTAL WITH REFLEX TO PSA, FREE: PSA, Total: 0.2 ng/mL (ref <=4.0)

## 2020-06-21 LAB — LIPID PANEL
Cholesterol: 118 mg/dL (ref <200)
HDL: 30 mg/dL — ABNORMAL LOW (ref >=40)
LDL Cholesterol (Calc): 61 mg/dL (calc)
Non-HDL Cholesterol (Calc): 88 mg/dL (calc) (ref <130)
Total CHOL/HDL Ratio: 3.9 (calc) (ref <5.0)
Triglycerides: 197 mg/dL — ABNORMAL HIGH (ref <150)

## 2020-06-21 LAB — HEMOGLOBIN A1C
Hgb A1c MFr Bld: 5.1 % of total Hgb (ref <5.7)
Mean Plasma Glucose: 100 (calc)
eAG (mmol/L): 5.5 (calc)

## 2020-06-21 MED ORDER — SPIRONOLACTONE 25 MG PO TABS: ORAL_TABLET | ORAL | 1 refills | Status: DC

## 2020-06-21 NOTE — Telephone Encounter (Signed)
See messages below. I left a vm for pt to call back to discuss.  ___________  Lonn Georgia, PA-C  [name], DO Cc: Barrett, Evelene Croon, PA-C; Therisa Doyne Thanks for letting me know, I had not paid much attention to it.   Lovena Le, can you call him and ask him to stop it as it can increase his bleeding risk?  Thank you  RGB       Previous Messages   ----- Message -----  From: [name], DO  Sent: 06/21/2020  9:40 AM EDT  To: Lonn Georgia, PA-C  Subject: mutual patient                  Hi there!  Mr Atallah just saw me (PCP) for annual  See labs - TG elevated but otherwise LDL looking good, A1C good  He's on some kind of supplement with [facility] included among other things - I asked him to check with you folks about it, he's on ASA/Coumadin already.  Thanks!  -Lanelle Bal

## 2020-06-21 NOTE — Progress Notes (Signed)
Jeffrey Larson is a 64 y.o. male who presents to  [place] [facility]  today, 06/21/20, seeking care for the following:  . Annual physical  . Hypotension - BP was 114/69 few days ago at cardiology, today is 99/47 on intake, no symptoms. Cardiology reduced Toprol XL.  . (+)PHQ2 w/ little interest/pleasure doing things, but rest of questions normal on PHQ9       ASSESSMENT & PLAN with other pertinent findings:  The primary encounter diagnosis was Annual physical exam. Diagnoses of S/P mitral valve replacement with metallic valve , Essential hypertension, benign, Controlled type 2 diabetes mellitus without complication, without long-term current use of insulin ([place]), and Colon cancer screening were also pertinent to this visit.   Will forward labs to Cardiology   General Preventive Care  Most recent routine screening labs: already done!   Blood pressure goal 130/80 or less.   Tobacco: don't!   Alcohol: responsible moderation is ok for most adults - if you have concerns about your alcohol intake, please talk to me!   Exercise: as tolerated to reduce risk of cardiovascular disease and diabetes. Strength training will also prevent osteoporosis.   Mental health: if need for mental health care (medicines, counseling, other), or concerns about moods, please let me know!   Sexual / Reproductive health: if need for STD testing, or if concerns with libido/pain problems, please let me know!   Advanced Directive: Living Will and/or Healthcare Power of Attorney recommended for all adults, regardless of age or health.  Vaccines  Flu vaccine: for almost everyone, every fall.   Shingles vaccine: after age 32.   Pneumonia vaccines: after age 61  Tetanus booster: every 10 years   COVID vaccine: THANKS for getting your vaccine! :) Cancer screenings   Colon cancer screening: for everyone age 70-75. Cologuard stool test due!   Prostate  cancer screening: PSA blood test age 8-71, in process w/ the lab  Lung cancer screening: not needed since quit >15 years ago  Infection screenings  . HIV: recommended screening at least once age 34-65 . Gonorrhea/Chlamydia: screening as needed . Hepatitis C: recommended once for everyone age 64-75 . TB: certain at-risk populations, or depending on work requirements and/or travel history Other . Bone Density Test: recommended for men at age 7 . Abdominal Aortic Aneurysm: screening with ultrasound recommended once for men age 57-75 who have ever smoked         Results for orders placed or performed in visit on 03/10/20 (from the past 24 hour(s))  CBC     Status: None   Collection Time: 06/20/20 10:29 AM  Result Value Ref Range   WBC 9.2 3.8 - 10.8 Thousand/uL   RBC 4.67 4.20 - 5.80 Million/uL   Hemoglobin 13.2 13.2 - 17.1 g/dL   HCT 40.2 38 - 50 %   MCV 86.1 80.0 - 100.0 fL   MCH 28.3 27.0 - 33.0 pg   MCHC 32.8 32.0 - 36.0 g/dL   RDW 13.8 11.0 - 15.0 %   Platelets 309 140 - 400 Thousand/uL   MPV 10.2 7.5 - 12.5 fL  COMPLETE METABOLIC PANEL WITH GFR     Status: Abnormal   Collection Time: 06/20/20 10:29 AM  Result Value Ref Range   Glucose, Bld 105 (H) 65 - 99 mg/dL   BUN 17 7 - 25 mg/dL   Creat 1.06 0.70 - 1.25 mg/dL   GFR, Est Non African American 74 > OR =  60 mL/min/1.63m2   GFR, Est African American 86 > OR = 60 mL/min/1.77m2   BUN/Creatinine Ratio NOT APPLICABLE 6 - 22 (calc)   Sodium 140 135 - 146 mmol/L   Potassium 4.3 3.5 - 5.3 mmol/L   Chloride 105 98 - 110 mmol/L   CO2 26 20 - 32 mmol/L   Calcium 9.2 8.6 - 10.3 mg/dL   Total Protein 6.9 6.1 - 8.1 g/dL   Albumin 4.5 3.6 - 5.1 g/dL   Globulin 2.4 1.9 - 3.7 g/dL (calc)   AG Ratio 1.9 1.0 - 2.5 (calc)   Total Bilirubin 0.5 0.2 - 1.2 mg/dL   Alkaline phosphatase (APISO) 76 35 - 144 U/L   AST 15 10 - 35 U/L   ALT 18 9 - 46 U/L  Lipid panel     Status: Abnormal   Collection Time: 06/20/20 10:29 AM  Result  Value Ref Range   Cholesterol 118 <200 mg/dL   HDL 30 (L) > OR = 40 mg/dL   Triglycerides 197 (H) <150 mg/dL   LDL Cholesterol (Calc) 61 mg/dL (calc)   Total CHOL/HDL Ratio 3.9 <5.0 (calc)   Non-HDL Cholesterol (Calc) 88 <130 mg/dL (calc)  Hemoglobin A1c     Status: None   Collection Time: 06/20/20 10:29 AM  Result Value Ref Range   Hgb A1c MFr Bld 5.1 <5.7 % of total Hgb   Mean Plasma Glucose 100 (calc)   eAG (mmol/L) 5.5 (calc)    Constitutional:  . VSS, see nurse notes . General Appearance: alert, well-developed, well-nourished, NAD Eyes: Marland Kitchen Normal lids and conjunctive, non-icteric sclera Ears, Nose, Mouth, Throat: . Normal appearance Neck: . No masses, trachea midline . No thyroid enlargement/tenderness/mass appreciated Respiratory: . Normal respiratory effort . Breath sounds normal, no wheeze/rhonchi/rales Cardiovascular: . S1/S2 normal, no murmur/rub/gallop auscultated . No lower extremity edema Gastrointestinal: . Nontender, no masses . No hepatomegaly, no splenomegaly . No hernia appreciated Musculoskeletal:  . Gait normal . No clubbing/cyanosis of digits Neurological: . No cranial nerve deficit on limited exam . Motor and sensation intact and symmetric Psychiatric: . Normal judgment/insight . Normal mood and affect    There are no Patient Instructions on file for this visit.  Orders Placed This Encounter  Procedures  . Cologuard    Meds ordered this encounter  Medications  . spironolactone (ALDACTONE) 25 MG tablet    Sig: TAKE 1 TABLET(25 MG) BY MOUTH DAILY    Dispense:  90 tablet    Refill:  1       Follow-up instructions: Return in about 6 months (around 12/22/2020) for A1C CHECK (HX DIABETES, SUGARS HAVE BEEN GOOD) - see me sooner if needed! .                                         BP 110/70   Pulse 67   Temp 98 F (36.7 C)   Ht 5\' 11"  (1.803 m)   Wt (!) 297 lb (134.7 kg)   SpO2 98%   BMI 41.42  kg/m   Current Meds  Medication Sig  . acetaminophen (TYLENOL) 500 MG tablet Take 1,000 mg by mouth 2 (two) times daily.  Marland Kitchen aspirin 81 MG tablet Take 81 mg by mouth daily.  Marland Kitchen atorvastatin (LIPITOR) 40 MG tablet Take 1 tablet (40 mg total) by mouth every evening.  . diclofenac Sodium (VOLTAREN) 1 % GEL Apply 4 grams  to back of heel 3-4x per day.  . Fluticasone-Umeclidin-Vilant (TRELEGY ELLIPTA) 100-62.5-25 MCG/INH AEPB Inhale 1 puff into the lungs daily.  . metoprolol succinate (TOPROL XL) 25 MG 24 hr tablet Take 1 tablet (25 mg total) by mouth daily.  . nitroGLYCERIN (NITROSTAT) 0.4 MG SL tablet Place 1 tablet (0.4 mg total) under the tongue every 5 (five) minutes x 3 doses as needed for chest pain.  . Omega-3 Fatty Acids (FISH OIL) 1200 MG CAPS Take 1,200 mg by mouth daily.  . sacubitril-valsartan (ENTRESTO) 24-26 MG Take 1 tablet by mouth 2 (two) times daily. Needs appointment for more refills.  . Semaglutide, 1 MG/DOSE, (OZEMPIC, 1 MG/DOSE,) 2 MG/1.5ML SOPN Inject 1 mg into the skin once a week.  . sildenafil (REVATIO) 20 MG tablet Take 1 tablet (20 mg total) by mouth 3 (three) times daily.  Marland Kitchen spironolactone (ALDACTONE) 25 MG tablet TAKE 1 TABLET(25 MG) BY MOUTH DAILY  . torsemide (DEMADEX) 20 MG tablet Take 2 tablets (40 mg total) by mouth daily.  . traMADol (ULTRAM) 50 MG tablet Take 1 tablet (50 mg total) by mouth every 8 (eight) hours as needed for moderate pain.  Marland Kitchen warfarin (COUMADIN) 4 MG tablet Take 4 mg daily  . [DISCONTINUED] spironolactone (ALDACTONE) 25 MG tablet TAKE 1 TABLET(25 MG) BY MOUTH DAILY     No results found.     All questions at time of visit were answered - patient instructed to contact office with any additional concerns or updates.  ER/RTC precautions were reviewed with the patient as applicable.   Please note: voice recognition software was used to produce this document, and typos may escape review. Please contact Dr. Sheppard Coil for any needed  clarifications.

## 2020-06-21 NOTE — Telephone Encounter (Signed)
Pt returned call. He stated he is going on a diet and he ordered supplements that are basically vitamins and the patch that goes on his skin contains willow bark. He stated he has not started it yet but was thankful for the information. He said he will not do the patch. I told pt to also make sure the pills do not contain willow bark. Pt verbalized understanding and thanks.

## 2020-06-26 DIAGNOSIS — U071 COVID-19: Secondary | ICD-10-CM

## 2020-06-26 HISTORY — DX: COVID-19: U07.1

## 2020-07-10 ENCOUNTER — Other Ambulatory Visit (HOSPITAL_COMMUNITY): Payer: Self-pay | Admitting: Adult Health

## 2020-07-12 ENCOUNTER — Ambulatory Visit (INDEPENDENT_AMBULATORY_CARE_PROVIDER_SITE_OTHER): Payer: 59 | Admitting: *Deleted

## 2020-07-12 DIAGNOSIS — I4891 Unspecified atrial fibrillation: Secondary | ICD-10-CM

## 2020-07-12 DIAGNOSIS — Z5181 Encounter for therapeutic drug level monitoring: Secondary | ICD-10-CM | POA: Diagnosis not present

## 2020-07-12 DIAGNOSIS — Z954 Presence of other heart-valve replacement: Secondary | ICD-10-CM | POA: Diagnosis not present

## 2020-07-12 LAB — POCT INR: INR: 1.7 — AB (ref 2.0–3.0)

## 2020-07-12 NOTE — Patient Instructions (Signed)
On a diet.  Eating more veggies Take warfarin 2 tablets tonight then increase dose to 1 tablet daily except 1 1/2 tablets on Tuesdays, Thursdays and Saturdays Recheck INR in 2 wks

## 2020-07-18 ENCOUNTER — Other Ambulatory Visit: Payer: Self-pay

## 2020-07-18 MED ORDER — ENTRESTO 24-26 MG PO TABS: 1.0000 | ORAL_TABLET | Freq: Two times a day (BID) | ORAL | 3 refills | Status: DC

## 2020-07-25 ENCOUNTER — Ambulatory Visit (INDEPENDENT_AMBULATORY_CARE_PROVIDER_SITE_OTHER): Payer: 59 | Admitting: *Deleted

## 2020-07-25 DIAGNOSIS — I4891 Unspecified atrial fibrillation: Secondary | ICD-10-CM | POA: Diagnosis not present

## 2020-07-25 DIAGNOSIS — Z5181 Encounter for therapeutic drug level monitoring: Secondary | ICD-10-CM

## 2020-07-25 DIAGNOSIS — Z954 Presence of other heart-valve replacement: Secondary | ICD-10-CM | POA: Diagnosis not present

## 2020-07-25 LAB — COLOGUARD: Cologuard: POSITIVE — AB

## 2020-07-25 LAB — POCT INR: INR: 4.1 — AB (ref 2.0–3.0)

## 2020-07-25 NOTE — Patient Instructions (Signed)
On a diet.  Eating more veggies Hold warfarin tonight then decrease dose to 1 tablet daily except 1 1/2 tablets on Tuesdays and Saturdays Recheck INR in 3 wks

## 2020-07-28 ENCOUNTER — Other Ambulatory Visit: Payer: Self-pay | Admitting: *Deleted

## 2020-07-28 MED ORDER — WARFARIN SODIUM 4 MG PO TABS: ORAL_TABLET | ORAL | 0 refills | Status: DC

## 2020-08-02 LAB — COLOGUARD: COLOGUARD: POSITIVE — AB

## 2020-08-03 ENCOUNTER — Telehealth: Payer: Self-pay | Admitting: Osteopathic Medicine

## 2020-08-03 ENCOUNTER — Encounter: Payer: Self-pay | Admitting: Osteopathic Medicine

## 2020-08-03 DIAGNOSIS — R195 Other fecal abnormalities: Secondary | ICD-10-CM

## 2020-08-03 NOTE — Telephone Encounter (Signed)
MyChart message sent: please also call patient to ensure they are aware  Cologuard colon cancer screening test was positive. The next step is to get a colonoscopy to determine if polyps, cancer or other abnormality. I have placed referral for gastroenterology and he should get a call about setting something up.

## 2020-08-03 NOTE — Telephone Encounter (Signed)
Patient contacted, cologuard results given. Patient voiced his understanding with no questions at this time.

## 2020-08-05 ENCOUNTER — Encounter: Payer: Self-pay | Admitting: Osteopathic Medicine

## 2020-08-05 NOTE — Progress Notes (Signed)
Patient notified of positive results

## 2020-08-08 ENCOUNTER — Encounter: Payer: Self-pay | Admitting: Gastroenterology

## 2020-08-15 ENCOUNTER — Other Ambulatory Visit: Payer: 59

## 2020-08-15 ENCOUNTER — Ambulatory Visit (INDEPENDENT_AMBULATORY_CARE_PROVIDER_SITE_OTHER): Payer: 59 | Admitting: *Deleted

## 2020-08-15 ENCOUNTER — Other Ambulatory Visit: Payer: Self-pay | Admitting: Critical Care Medicine

## 2020-08-15 DIAGNOSIS — Z954 Presence of other heart-valve replacement: Secondary | ICD-10-CM | POA: Diagnosis not present

## 2020-08-15 DIAGNOSIS — Z20822 Contact with and (suspected) exposure to covid-19: Secondary | ICD-10-CM

## 2020-08-15 DIAGNOSIS — Z5181 Encounter for therapeutic drug level monitoring: Secondary | ICD-10-CM | POA: Diagnosis not present

## 2020-08-15 DIAGNOSIS — I4891 Unspecified atrial fibrillation: Secondary | ICD-10-CM | POA: Diagnosis not present

## 2020-08-15 LAB — POCT INR: INR: 3 (ref 2.0–3.0)

## 2020-08-15 NOTE — Patient Instructions (Addendum)
On a diet.  Eating more veggies Continue warfarin 1 tablet daily except 1 1/2 tablets on Tuesdays and Saturdays Recheck INR in 4 wks Appt with GI 11/25 in [facility] to discuss colonoscopy

## 2020-08-18 LAB — SARS-COV-2, NAA 2 DAY TAT

## 2020-08-18 LAB — SPECIMEN STATUS REPORT

## 2020-08-18 LAB — NOVEL CORONAVIRUS, NAA: SARS-CoV-2, NAA: NOT DETECTED

## 2020-08-26 ENCOUNTER — Other Ambulatory Visit: Payer: Self-pay

## 2020-08-26 ENCOUNTER — Other Ambulatory Visit: Payer: 59

## 2020-08-26 DIAGNOSIS — Z20822 Contact with and (suspected) exposure to covid-19: Secondary | ICD-10-CM

## 2020-08-27 LAB — NOVEL CORONAVIRUS, NAA: SARS-CoV-2, NAA: NOT DETECTED

## 2020-08-27 LAB — SARS-COV-2, NAA 2 DAY TAT

## 2020-09-15 ENCOUNTER — Ambulatory Visit (INDEPENDENT_AMBULATORY_CARE_PROVIDER_SITE_OTHER): Payer: 59 | Admitting: Gastroenterology

## 2020-09-15 ENCOUNTER — Telehealth: Payer: Self-pay | Admitting: General Surgery

## 2020-09-15 ENCOUNTER — Encounter: Payer: Self-pay | Admitting: Gastroenterology

## 2020-09-15 VITALS — BP 104/60 | HR 66 | Ht 71.0 in | Wt 263.2 lb

## 2020-09-15 DIAGNOSIS — Z7901 Long term (current) use of anticoagulants: Secondary | ICD-10-CM

## 2020-09-15 DIAGNOSIS — Z6836 Body mass index (BMI) 36.0-36.9, adult: Secondary | ICD-10-CM

## 2020-09-15 DIAGNOSIS — I4811 Longstanding persistent atrial fibrillation: Secondary | ICD-10-CM

## 2020-09-15 DIAGNOSIS — Z954 Presence of other heart-valve replacement: Secondary | ICD-10-CM

## 2020-09-15 DIAGNOSIS — I2510 Atherosclerotic heart disease of native coronary artery without angina pectoris: Secondary | ICD-10-CM

## 2020-09-15 DIAGNOSIS — R195 Other fecal abnormalities: Secondary | ICD-10-CM | POA: Diagnosis not present

## 2020-09-15 NOTE — Telephone Encounter (Signed)
Patient with diagnosis of A fib with mitral valve replacement on warfarin for anticoagulation.    INR goal 2.5-3.5.  Last INR 3.0 on 08/15/20.  Procedure: colonoscopy Date of procedure: 11/01/20  This indicates a 4.8% annual risk of stroke. The patient's score is based upon: CHF History: 1 HTN History: 1 Diabetes History: 1 Stroke History: 0 Vascular Disease History: 1 Age Score: 0 Gender Score: 0   CrCl 93 mL/min using adjusted body weight  Platelet count 309K  Per office protocol, patient can hold warfarin for 5 days prior to procedure.   Patient WILL need bridging with Lovenox (enoxaparin) around procedure.  Will send back to pre op pool and send message to Coumadin clinic in [place]

## 2020-09-15 NOTE — Telephone Encounter (Signed)
   Primary Cardiologist: Kate Sable, MD (Inactive)  Chart reviewed as part of pre-operative protocol coverage. We have been asked for guidance to hold coumadin, not medical clearance.  Per our clinical pharmacist: Patient with diagnosis of A fib with mitral valve replacement on warfarin for anticoagulation.    INR goal 2.5-3.5.  Last INR 3.0 on 08/15/20.  Procedure: colonoscopy Date of procedure: 11/01/20  This indicates a 4.8% annual risk of stroke. The patient's score is based upon: CHF History: 1 HTN History: 1 Diabetes History: 1 Stroke History: 0 Vascular Disease History: 1 Age Score: 0 Gender Score: 0   CrCl 93 mL/min using adjusted body weight  Platelet count 309K  Per office protocol, patient can hold warfarin for 5 days prior to procedure.   Patient WILL need bridging with Lovenox (enoxaparin) around procedure.  Will send back to pre op pool and send message to Coumadin clinic in [place].   I will route this recommendation to the requesting party via Epic fax function and remove from pre-op pool. Please call with questions.  Ledora Bottcher, PA 09/15/2020, 3:44 PM

## 2020-09-15 NOTE — Telephone Encounter (Signed)
Request for coumadin and lovenox bridge.

## 2020-09-15 NOTE — Telephone Encounter (Signed)
[facility] Pre-operative Risk Assessment     Request for surgical and medical clearance:     Endoscopy Procedure  What type of surgery is being performed?     Colonoscopy  When is this surgery scheduled?     11/01/2020  What type of clearance is required ?   Pharmacy  Are there any medications that need to be held prior to surgery and how long? Coumadin for 5 days with Lovenox bridge  Practice name and name of physician performing surgery?      [facility]  What is your office phone and fax number?      Phone- [phone]  [phone]  Anesthesia type (None, local, MAC, general) ?       MAC

## 2020-09-15 NOTE — Patient Instructions (Signed)
If you are age 64 or older, your body mass index should be between 23-30. Your Body mass index is 36.72 kg/m. If this is out of the aforementioned range listed, please consider follow up with your Primary Care Provider.  If you are age 80 or younger, your body mass index should be between 19-25. Your Body mass index is 36.72 kg/m. If this is out of the aformentioned range listed, please consider follow up with your Primary Care Provider.   We have given you a sample of the prep to be used for your Colonoscopy-Clenpiq. Please follow the instructions given.  You will be contaced by our office prior to your procedure for directions on holding your Coumadin/Warfarin.  If you do not hear from our office 1 week prior to your scheduled procedure, please call [phone] to discuss.  Due to recent changes in healthcare laws, you may see the results of your imaging and laboratory studies on MyChart before your provider has had a chance to review them.  We understand that in some cases there may be results that are confusing or concerning to you. Not all laboratory results come back in the same time frame and the provider may be waiting for multiple results in order to interpret others.  Please give [place] 48 hours in order for your provider to thoroughly review all the results before contacting the office for clarification of your results.

## 2020-09-15 NOTE — Progress Notes (Signed)
Chief Complaint: Positive Cologuard test, systemic anticoagulation  Referring Provider:     [name], DO   HPI:     Jeffrey Larson is a 64 y.o. male with a history of A. fib (s/p Maze 2019 on Coumadin), tricuspid valve repair 2019, COPD, CAD s/p PCI, history of Covid, HLD, CHF (EF 55-60% 11/2018), mild OSA (not requiring CPAP), mitral stenosis with regurgitation s/p mitral valve replacement with metallic valve 2202, obesity (BMI 36), referred to the Gastroenterology Clinic for evaluation of colon cancer screening in the setting of recent positive Cologuard test 07/25/2020 and systemic anticoagulation.  He o/w denies any GI sxs. Interestingly, INR was supratherapeutic at 4.1 at the time of the Cologuard.  No previous colonoscopy. Has had FIT testing in the past, and normal/negative per patient.   Was last seen in the Cardiology Clinic on 06/17/2020. Has appt in Coumadin clinic next week.  Normal CBC and CMP on 06/20/2020.  No known family history of CRC, GI malignancy, liver disease, pancreatic disease, or IBD.    Past Medical History:  Diagnosis Date  . Atrial fibrillation ([place])   . Chronic combined systolic and diastolic congestive heart failure ([place])   . COPD with chronic bronchitis ([place])   . Coronary artery disease   . COVID-19 virus infection   . Essential hypertension, benign    Ramipril to losartan Sept 2015   . History of pneumonia    RML on CXR 07/20/14  . Hyperlipidemia   . Longstanding persistent atrial fibrillation ([place])   . Mitral stenosis with regurgitation   . Myocardial infarction ([place])   . Obesity   . Pulmonary hypertension ([place])   . Renal disorder    history kidney stone  . S/P Maze operation for atrial fibrillation 07/18/2018   Complete bilateral atrial lesion set using bipolar radiofrequency and cryothermy ablation with clipping of LA appendage  . S/P mitral valve replacement with metallic valve 5/42/7062   Sorin [employer]  Optiform bileaflet mechanical valve, size 33 mm  . S/P TVR (tricuspid valve repair) 07/18/2018   Edwards mc3 ring annuloplasty, size 30 mm     Past Surgical History:  Procedure Laterality Date  . APPENDECTOMY  2016  . CARDIOVASCULAR STRESS TEST  03/2014   Borderline reversible ischemic changes at the apex.  Normal LV contractility/EF 52%.  . CORONARY STENT PLACEMENT  7/12013  . LEFT HEART CATHETERIZATION WITH CORONARY ANGIOGRAM N/A 06/12/2012   Procedure: LEFT HEART CATHETERIZATION WITH CORONARY ANGIOGRAM;  Surgeon: Clent Demark, MD;  Location: [facility] CATH LAB;  Service: Cardiovascular;  Laterality: N/A;  . MAZE N/A 07/18/2018   Procedure: MAZE;  Surgeon: Rexene Alberts, MD;  Location: [place];  Service: Open Heart Surgery;  Laterality: N/A;  . MITRAL VALVE REPLACEMENT N/A 07/18/2018   Procedure: MITRAL VALVE (MV) REPLACEMENT WITH CARBOMEDICS OPTIFORM MITRAL VALVE SIZE 33.;  Surgeon: Rexene Alberts, MD;  Location: [place];  Service: Open Heart Surgery;  Laterality: N/A;  . PERCUTANEOUS CORONARY STENT INTERVENTION (PCI-S) N/A 06/17/2012   Procedure: PERCUTANEOUS CORONARY STENT INTERVENTION (PCI-S);  Surgeon: Clent Demark, MD;  Location: [facility] CATH LAB;  Service: Cardiovascular;  Laterality: N/A;  . RIGHT/LEFT HEART CATH AND CORONARY ANGIOGRAPHY N/A 06/10/2018   Procedure: RIGHT/LEFT HEART CATH AND CORONARY ANGIOGRAPHY;  Surgeon: Dixie Dials, MD;  Location: [place] CV LAB;  Service: Cardiovascular;  Laterality: N/A;  . TEE WITHOUT CARDIOVERSION N/A 06/10/2018   Procedure: TRANSESOPHAGEAL ECHOCARDIOGRAM (  TEE) with bubble study;  Surgeon: Dixie Dials, MD;  Location: [place];  Service: Cardiovascular;  Laterality: N/A;  . TEE WITHOUT CARDIOVERSION N/A 07/18/2018   Procedure: TRANSESOPHAGEAL ECHOCARDIOGRAM (TEE);  Surgeon: Rexene Alberts, MD;  Location: [place];  Service: Open Heart Surgery;  Laterality: N/A;  . TRICUSPID VALVE REPLACEMENT N/A 07/18/2018   Procedure: TRICUSPID VALVE REPAIR  WITH EDWARDS MC3 TRICUSPID ANNULOPLASTY RING SIZE 30.;  Surgeon: Rexene Alberts, MD;  Location: [place];  Service: Open Heart Surgery;  Laterality: N/A;   Family History  Problem Relation Age of Onset  . Heart disease Mother   . Heart disease Father   . Diabetes Father   . Diabetes Brother   . Colon cancer Neg Hx   . Esophageal cancer Neg Hx    Social History   Tobacco Use  . Smoking status: Former Smoker    Packs/day: 2.00    Years: 15.00    Pack years: 30.00    Types: Cigarettes    Quit date: 11/26/1996    Years since quitting: 23.8  . Smokeless tobacco: Never Used  Vaping Use  . Vaping Use: Never used  Substance Use Topics  . Alcohol use: No  . Drug use: No   Current Outpatient Medications  Medication Sig Dispense Refill  . acetaminophen (TYLENOL) 500 MG tablet Take 1,000 mg by mouth 2 (two) times daily.    Marland Kitchen aspirin 81 MG tablet Take 81 mg by mouth daily.    Marland Kitchen atorvastatin (LIPITOR) 40 MG tablet Take 1 tablet (40 mg total) by mouth every evening. 90 tablet 3  . Fluticasone-Umeclidin-Vilant (TRELEGY ELLIPTA) 100-62.5-25 MCG/INH AEPB Inhale 1 puff into the lungs daily. 120 each 99  . metoprolol succinate (TOPROL XL) 25 MG 24 hr tablet Take 1 tablet (25 mg total) by mouth daily. 90 tablet 3  . nitroGLYCERIN (NITROSTAT) 0.4 MG SL tablet Place 1 tablet (0.4 mg total) under the tongue every 5 (five) minutes x 3 doses as needed for chest pain. 25 tablet 3  . Omega-3 Fatty Acids (FISH OIL) 1200 MG CAPS Take 1,200 mg by mouth daily.    . sacubitril-valsartan (ENTRESTO) 24-26 MG Take 1 tablet by mouth 2 (two) times daily. 180 tablet 3  . Semaglutide, 1 MG/DOSE, (OZEMPIC, 1 MG/DOSE,) 2 MG/1.5ML SOPN Inject 1 mg into the skin once a week. 5 pen 11  . sildenafil (REVATIO) 20 MG tablet Take 1 tablet (20 mg total) by mouth 3 (three) times daily. 270 tablet 3  . spironolactone (ALDACTONE) 25 MG tablet TAKE 1 TABLET(25 MG) BY MOUTH DAILY 90 tablet 1  . torsemide (DEMADEX) 20 MG tablet TAKE  2 TABLET BY MOUTH EVERY DAY 60 tablet 11  . traMADol (ULTRAM) 50 MG tablet Take 1 tablet (50 mg total) by mouth every 8 (eight) hours as needed for moderate pain. 30 tablet 0  . warfarin (COUMADIN) 4 MG tablet Take 4 mg daily except 6mg  on Tuesdays and Saturdays or as directed by Anticoagulation Clinic. 120 tablet 0   No current facility-administered medications for this visit.   Allergies  Allergen Reactions  . Ramipril Cough    cough  . Percocet [Oxycodone-Acetaminophen]     In ICU it contributed to hallucinations. Can take by itself     Review of Systems: All systems reviewed and negative except where noted in HPI.     Physical Exam:    Wt Readings from Last 3 Encounters:  09/15/20 263 lb 4 oz (119.4 kg)  06/21/20 Marland Kitchen)  297 lb (134.7 kg)  06/17/20 (!) 301 lb (136.5 kg)    BP 104/60   Pulse 66   Ht 5\' 11"  (1.803 m)   Wt 263 lb 4 oz (119.4 kg)   BMI 36.72 kg/m  Constitutional:  Pleasant, in no acute distress. Psychiatric: Normal mood and affect. Behavior is normal. EENT: Pupils normal.  Conjunctivae are normal. No scleral icterus. Neck supple. No cervical LAD. Cardiovascular: Regular rate, + click. No edema Pulmonary/chest: Effort normal and breath sounds normal. No wheezing, rales or rhonchi. Abdominal: Soft, nondistended, nontender. Bowel sounds active throughout. There are no masses palpable. No hepatomegaly. Neurological: Alert and oriented to person place and time. Skin: Skin is warm and dry. No rashes noted.   ASSESSMENT AND PLAN;   1) Positive Cologuard -Colonoscopy  2) Atrial fibrillation 3) Mechanical mitral valve 4) Tricuspid valve repair 5) Systemic anticoagulation (Coumadin) 6) CHF (EF 55-60%) 7) Obesity (BMI 36.7)  -Hold Coumadin 5 days before procedure with plan for Lovenox bridge. Low but real risk of cardiovascular event such as heart attack, stroke, embolism, thrombosis or ischemia/infarct of other organs off Coumadin, but medicating with  Lovenox bridge.  Explained and need to seek urgent help if this occurs. The patient consents to proceed. Will communicate by phone or EMR with patient's Cardiology Clinic and Coumadin Clinic providers for preoperative clearance and set up Coumadin hold with Lovenox bridge.  The indications, risks, and benefits of colonoscopy were explained to the patient in detail. Risks include but are not limited to bleeding, perforation, adverse reaction to medications, and cardiopulmonary compromise. Sequelae include but are not limited to the possibility of surgery, hospitalization, and mortality. The patient verbalized understanding and wished to proceed. All questions answered, referred to the scheduler and bowel prep ordered. Further recommendations pending results of the exam.    Lavena Bullion, DO, FACG  09/15/2020, 10:31 AM   [name], DO

## 2020-09-19 ENCOUNTER — Ambulatory Visit (INDEPENDENT_AMBULATORY_CARE_PROVIDER_SITE_OTHER): Payer: 59 | Admitting: *Deleted

## 2020-09-19 DIAGNOSIS — Z5181 Encounter for therapeutic drug level monitoring: Secondary | ICD-10-CM

## 2020-09-19 DIAGNOSIS — Z954 Presence of other heart-valve replacement: Secondary | ICD-10-CM | POA: Diagnosis not present

## 2020-09-19 DIAGNOSIS — I4891 Unspecified atrial fibrillation: Secondary | ICD-10-CM | POA: Diagnosis not present

## 2020-09-19 LAB — POCT INR: INR: 4.4 — AB (ref 2.0–3.0)

## 2020-09-19 NOTE — Patient Instructions (Signed)
Hold warfarin tonight then decrease dose to 1 tablet daily except 1 1/2 tablets on Saturdays Recheck INR in 4 wks  Colonoscopy scheduled for 11/01/20.  Will hold warfarin 5 days before procedure and bridge with Lovenox.

## 2020-09-21 NOTE — Telephone Encounter (Signed)
Called and LV to callback and ask for preop pool

## 2020-09-21 NOTE — Telephone Encounter (Signed)
My original encounter for Mr Auld

## 2020-09-21 NOTE — Telephone Encounter (Signed)
In my original request I did request surgical,medical clearance and holding Coumadin with the Lovenox bridge. Please advise on medical clearance for this patient.

## 2020-09-21 NOTE — Telephone Encounter (Signed)
   Primary Cardiologist: Kate Sable, MD (Inactive)  Chart reviewed as part of pre-operative protocol coverage. Patient was last seen 06/17/20 and was doing well from a cardiac standpoint. Since then he says he has lost about 45 lbs. No chest pain, sob, palpitations, lower leg edema, orthopnea, dizziness. METS>4. Given past medical history and time since last visit, based on ACC/AHA guidelines, Jeffrey Larson would be at acceptable risk for the planned procedure without further cardiovascular testing.   Per clinical pharmacist the patient can holdwarfarinfor 5days prior to procedure. PatientWILLneed bridging with Lovenox (enoxaparin) around procedure. Message was sent back to coumadin clinic in [place].   The patient was advised that if he develops new symptoms prior to surgery to contact our office to arrange for a follow-up visit, and he verbalized understanding.  I will route this recommendation to the requesting party via Epic fax function and remove from pre-op pool.  Please call with questions.  Xylia Scherger Ninfa Meeker, PA-C 09/21/2020, 3:04 PM

## 2020-09-21 NOTE — Telephone Encounter (Signed)
Patient returning call.

## 2020-10-13 ENCOUNTER — Other Ambulatory Visit: Payer: Self-pay | Admitting: Student

## 2020-10-13 MED ORDER — ATORVASTATIN CALCIUM 40 MG PO TABS: 40.0000 mg | ORAL_TABLET | Freq: Every evening | ORAL | 3 refills | Status: DC

## 2020-10-17 ENCOUNTER — Ambulatory Visit (INDEPENDENT_AMBULATORY_CARE_PROVIDER_SITE_OTHER): Payer: 59 | Admitting: *Deleted

## 2020-10-17 DIAGNOSIS — I4891 Unspecified atrial fibrillation: Secondary | ICD-10-CM | POA: Diagnosis not present

## 2020-10-17 DIAGNOSIS — Z954 Presence of other heart-valve replacement: Secondary | ICD-10-CM

## 2020-10-17 DIAGNOSIS — Z5181 Encounter for therapeutic drug level monitoring: Secondary | ICD-10-CM | POA: Diagnosis not present

## 2020-10-17 LAB — POCT INR: INR: 2.6 (ref 2.0–3.0)

## 2020-10-17 MED ORDER — ENOXAPARIN SODIUM 120 MG/0.8ML ~~LOC~~ SOLN
120.0000 mg | Freq: Two times a day (BID) | SUBCUTANEOUS | 0 refills | Status: DC
Start: 2020-10-28 — End: 2020-11-07

## 2020-10-17 NOTE — Patient Instructions (Addendum)
11/01/20  Pending colonoscopy  Labs: SCr 1.06  CrCl 114.5  Hgb 13.2  Hct 40.2  Age 64  Wt 115kg  12/1  Take last dose of warfarin 12/2  No warfarin or Lovenox 12/3 - 12/5  Lovenox 120mg  SQ in am and pm 12/6  Lovenox 120mg  in am-----no Lovenox in pm 12/7  No Lovenox in am ----------colonoscopy-------No Lovenox in pm.  Take warfarin 6mg  pm 12/8 - 12/9  Lovenox 120mg  am and pm and warfarin 6mg  pm 12/10  Lovenox 120mg  am and pm and warfarin 4mg  pm 12/11  Lovenox 120mg  am and pm and warfarin 6mg  pm 12/12  Lovenox 120mg  am and pm and Warfarin 4mg  pm 12/13  Lovenox 120mg  pm --------INR check

## 2020-10-18 ENCOUNTER — Encounter: Payer: Self-pay | Admitting: Gastroenterology

## 2020-11-01 ENCOUNTER — Other Ambulatory Visit: Payer: Self-pay

## 2020-11-01 ENCOUNTER — Ambulatory Visit (AMBULATORY_SURGERY_CENTER): Payer: 59 | Admitting: Gastroenterology

## 2020-11-01 ENCOUNTER — Encounter: Payer: Self-pay | Admitting: Gastroenterology

## 2020-11-01 VITALS — BP 109/77 | HR 59 | Temp 97.8°F | Resp 17 | Ht 71.0 in | Wt 263.0 lb

## 2020-11-01 DIAGNOSIS — R195 Other fecal abnormalities: Secondary | ICD-10-CM | POA: Diagnosis not present

## 2020-11-01 DIAGNOSIS — K6289 Other specified diseases of anus and rectum: Secondary | ICD-10-CM

## 2020-11-01 DIAGNOSIS — D125 Benign neoplasm of sigmoid colon: Secondary | ICD-10-CM | POA: Diagnosis not present

## 2020-11-01 DIAGNOSIS — D123 Benign neoplasm of transverse colon: Secondary | ICD-10-CM | POA: Diagnosis not present

## 2020-11-01 DIAGNOSIS — K5730 Diverticulosis of large intestine without perforation or abscess without bleeding: Secondary | ICD-10-CM

## 2020-11-01 DIAGNOSIS — D124 Benign neoplasm of descending colon: Secondary | ICD-10-CM

## 2020-11-01 DIAGNOSIS — D122 Benign neoplasm of ascending colon: Secondary | ICD-10-CM

## 2020-11-01 MED ORDER — SODIUM CHLORIDE 0.9 % IV SOLN: 500.0000 mL | Freq: Once | INTRAVENOUS | Status: DC

## 2020-11-01 NOTE — Patient Instructions (Signed)
Handout given: diverticulosis, polyps Resume previous diet Resume lovenox at prior dose tomorrow  Refer to coumadin clinic for further adjustment of therapy Await pathology results Repeat colonoscopy in 1 year  YOU HAD AN ENDOSCOPIC PROCEDURE TODAY AT [place]:   Refer to the procedure report that was given to you for any specific questions about what was found during the examination.  If the procedure report does not answer your questions, please call your gastroenterologist to clarify.  If you requested that your care partner not be given the details of your procedure findings, then the procedure report has been included in a sealed envelope for you to review at your convenience later.  YOU SHOULD EXPECT: Some feelings of bloating in the abdomen. Passage of more gas than usual.  Walking can help get rid of the air that was put into your GI tract during the procedure and reduce the bloating. If you had a lower endoscopy (such as a colonoscopy or flexible sigmoidoscopy) you may notice spotting of blood in your stool or on the toilet paper. If you underwent a bowel prep for your procedure, you may not have a normal bowel movement for a few days.  Please Note:  You might notice some irritation and congestion in your nose or some drainage.  This is from the oxygen used during your procedure.  There is no need for concern and it should clear up in a day or so.  SYMPTOMS TO REPORT IMMEDIATELY:   Following lower endoscopy (colonoscopy or flexible sigmoidoscopy):  Excessive amounts of blood in the stool  Significant tenderness or worsening of abdominal pains  Swelling of the abdomen that is new, acute  Fever of 100F or higher  For urgent or emergent issues, a gastroenterologist can be reached at any hour by calling [phone]. Do not use MyChart messaging for urgent concerns.   DIET:  We do recommend a small meal at first, but then you may proceed to your regular diet.   Drink plenty of fluids but you should avoid alcoholic beverages for 24 hours.  ACTIVITY:  You should plan to take it easy for the rest of today and you should NOT DRIVE or use heavy machinery until tomorrow (because of the sedation medicines used during the test).    FOLLOW UP: Our staff will call the number listed on your records 48-72 hours following your procedure to check on you and address any questions or concerns that you may have regarding the information given to you following your procedure. If we do not reach you, we will leave a message.  We will attempt to reach you two times.  During this call, we will ask if you have developed any symptoms of COVID 19. If you develop any symptoms (ie: fever, flu-like symptoms, shortness of breath, cough etc.) before then, please call [phone].  If you test positive for Covid 19 in the 2 weeks post procedure, please call and report this information to [place].    If any biopsies were taken you will be contacted by phone or by letter within the next 1-3 weeks.  Please call us at [phone] if you have not heard about the biopsies in 3 weeks.   SIGNATURES/CONFIDENTIALITY: You and/or your care partner have signed paperwork which will be entered into your electronic medical record.  These signatures attest to the fact that that the information above on your After Visit Summary has been reviewed and is understood.  Full responsibility of the  confidentiality of this discharge information lies with you and/or your care-partner. 

## 2020-11-01 NOTE — Progress Notes (Signed)
Vitals by CW 

## 2020-11-01 NOTE — Op Note (Signed)
[facility] [place] Patient Name: Jeffrey Larson Procedure Date: 11/01/2020 1:55 PM MRN: [id number] Endoscopist: Gerrit Heck , MD Age: 64 Referring MD:  Date of Birth: July 27, 1956 Gender: Male Account #: [id number] Procedure:                Colonoscopy Indications:              Positive Cologuard test Medicines:                Monitored Anesthesia Care Procedure:                Pre-Anesthesia Assessment:                           - Prior to the procedure, a History and Physical                            was performed, and patient medications and                            allergies were reviewed. The patient's tolerance of                            previous anesthesia was also reviewed. The risks                            and benefits of the procedure and the sedation                            options and risks were discussed with the patient.                            All questions were answered, and informed consent                            was obtained. Prior Anticoagulants: The patient has                            taken Coumadin (warfarin), last dose was 5 days                            prior to procedure. Last dose of Lovenox was 1 day                            prior to procedure. ASA Grade Assessment: III - A                            patient with severe systemic disease. After                            reviewing the risks and benefits, the patient was                            deemed in satisfactory condition to undergo the  procedure.                           After obtaining informed consent, the colonoscope                            was passed under direct vision. Throughout the                            procedure, the patient's blood pressure, pulse, and                            oxygen saturations were monitored continuously. The                            Colonoscope was introduced through the anus and                             advanced to the the cecum, identified by                            appendiceal orifice and ileocecal valve. The                            colonoscopy was performed without difficulty. The                            patient tolerated the procedure well. The quality                            of the bowel preparation was good. The ileocecal                            valve, appendiceal orifice, and rectum were                            photographed. Scope In: 2:10:33 PM Scope Out: 2:45:41 PM Scope Withdrawal Time: 0 hours 28 minutes 31 seconds  Total Procedure Duration: 0 hours 35 minutes 8 seconds  Findings:                 The perianal and digital rectal examinations were                            normal.                           16 sessile polyps were found in the sigmoid colon                            (7), descending colon (1), transverse colon (4),                            and ascending colon (4). The polyps were 3 to 8 mm  in size. These polyps were removed with a cold                            snare. Resection and retrieval were complete.                            Estimated blood loss was minimal.                           A 12 mm polyp was found in the distal sigmoid                            colon. The polyp was pedunculated. The polyp was                            removed with a hot snare. Resection and retrieval                            were complete. Estimated blood loss was minimal.                           A few small-mouthed diverticula were found in the                            sigmoid colon.                           A moderate amount of semi-liquid stool was found in                            the entire colon. Lavage of the area was performed                            using copious amounts of tap water, resulting in                            clearance with good visualization.                           Anal papilla(e)  were hypertrophied. Biopsies were                            taken with a cold forceps for histology. Estimated                            blood loss was minimal. Complications:            No immediate complications. Estimated Blood Loss:     Estimated blood loss was minimal. Impression:               - 16 3 to 8 mm polyps in the sigmoid colon, in the                            descending colon, in the transverse  colon and in                            the ascending colon, removed with a cold snare.                            Resected and retrieved.                           - One 12 mm polyp in the distal sigmoid colon,                            removed with a hot snare. Resected and retrieved.                           - Diverticulosis in the sigmoid colon.                           - Stool in the entire examined colon.                           - Anal papilla(e) were hypertrophied. Biopsied. Recommendation:           - Patient has a contact number available for                            emergencies. The signs and symptoms of potential                            delayed complications were discussed with the                            patient. Return to normal activities tomorrow.                            Written discharge instructions were provided to the                            patient.                           - Resume previous diet.                           - Resume Lovenox (enoxaparin) at prior dose                            tomorrow per bridge. Refer to Coumadin Clinic for                            further adjustment of therapy.                           - Await pathology results.                           -  Repeat colonoscopy in 1 year for surveillance.                           - Return to GI clinic PRN. Gerrit Heck, MD 11/01/2020 2:53:42 PM

## 2020-11-01 NOTE — Progress Notes (Signed)
pt tolerated well. VSS. awake and to recovery. Report given to RN.  

## 2020-11-02 ENCOUNTER — Other Ambulatory Visit: Payer: Self-pay | Admitting: Cardiology

## 2020-11-03 ENCOUNTER — Telehealth: Payer: Self-pay

## 2020-11-03 NOTE — Telephone Encounter (Signed)
  Follow up Call-  Call back number 11/01/2020  Post procedure Call Back phone  # [phone]  Permission to leave phone message Yes  Some recent data might be hidden     Patient questions:  Do you have a fever, pain , or abdominal swelling? No. Pain Score  0 *  Have you tolerated food without any problems? Yes.    Have you been able to return to your normal activities? Yes.    Do you have any questions about your discharge instructions: Diet   No. Medications  No. Follow up visit  No.  Do you have questions or concerns about your Care? No.  Actions: * If pain score is 4 or above: No action needed, pain <4.  1. Have you developed a fever since your procedure? No  2.   Have you had an respiratory symptoms (SOB or cough) since your procedure? No  3.   Have you tested positive for COVID 19 since your procedure No  4.   Have you had any family members/close contacts diagnosed with the COVID 19 since your procedure?  No   If yes to any of these questions please route to Joylene John, RN and Joella Prince, RN

## 2020-11-07 ENCOUNTER — Ambulatory Visit (INDEPENDENT_AMBULATORY_CARE_PROVIDER_SITE_OTHER): Payer: 59 | Admitting: *Deleted

## 2020-11-07 ENCOUNTER — Other Ambulatory Visit: Payer: Self-pay

## 2020-11-07 DIAGNOSIS — Z5181 Encounter for therapeutic drug level monitoring: Secondary | ICD-10-CM

## 2020-11-07 DIAGNOSIS — I4891 Unspecified atrial fibrillation: Secondary | ICD-10-CM | POA: Diagnosis not present

## 2020-11-07 DIAGNOSIS — Z954 Presence of other heart-valve replacement: Secondary | ICD-10-CM

## 2020-11-07 LAB — POCT INR: INR: 1.7 — AB (ref 2.0–3.0)

## 2020-11-07 MED ORDER — ENOXAPARIN SODIUM 120 MG/0.8ML ~~LOC~~ SOLN
120.0000 mg | Freq: Two times a day (BID) | SUBCUTANEOUS | 0 refills | Status: DC
Start: 2020-11-08 — End: 2020-12-02

## 2020-11-07 NOTE — Patient Instructions (Signed)
Take warfarin 1 1/2 tablets x 3 days then resume 1 tablet daily except 1 1/2 tablets on Saturdays Continue lovenox until INR check on Thursdays Recheck INR 11/10/20

## 2020-11-10 ENCOUNTER — Other Ambulatory Visit: Payer: Self-pay

## 2020-11-10 ENCOUNTER — Ambulatory Visit (INDEPENDENT_AMBULATORY_CARE_PROVIDER_SITE_OTHER): Payer: 59 | Admitting: *Deleted

## 2020-11-10 DIAGNOSIS — Z5181 Encounter for therapeutic drug level monitoring: Secondary | ICD-10-CM

## 2020-11-10 DIAGNOSIS — I4891 Unspecified atrial fibrillation: Secondary | ICD-10-CM

## 2020-11-10 DIAGNOSIS — Z954 Presence of other heart-valve replacement: Secondary | ICD-10-CM | POA: Diagnosis not present

## 2020-11-10 LAB — POCT INR: INR: 2.5 (ref 2.0–3.0)

## 2020-11-10 NOTE — Patient Instructions (Signed)
Take warfarin 1 1/2 tablets tonight then resume 1 tablet daily except 1 1/2 tablets on Saturdays Stop Lovenox Recheck INR in 2 wks

## 2020-11-11 ENCOUNTER — Telehealth: Payer: Self-pay | Admitting: General Surgery

## 2020-11-11 NOTE — Telephone Encounter (Signed)
Fax sent to [facility] for colorectal surgeon appointment.

## 2020-11-11 NOTE — Telephone Encounter (Signed)
-----   Message from [place], DO sent at 11/11/2020 12:48 PM EST ----- The polyps resected during the recent colonoscopy were all Tubular Adenomas (17 in total were resected; all adenomas). Based on the number of adenomas, plan for repeat colonoscopy in 1 year for short interval surveillance.  The biopsies taken from the anorectal area demonstrate Anal Intraepithelial Lesion (AIN-1). This is a precancerous finding as well and recommend referral to Colorectal Surgery for evaluation and treatment.

## 2020-11-11 NOTE — Telephone Encounter (Signed)
Spoke with the patient and his wife regarding the pathology. The patient and his wife verbalized understanding. Patient was instructed we would refer him to a colorectal surgeon and he understood. I explained that I would place the referral and he would hear from them directly to schedule.

## 2020-11-16 ENCOUNTER — Encounter: Payer: Self-pay | Admitting: Osteopathic Medicine

## 2020-11-16 ENCOUNTER — Other Ambulatory Visit: Payer: Self-pay

## 2020-11-16 ENCOUNTER — Ambulatory Visit (INDEPENDENT_AMBULATORY_CARE_PROVIDER_SITE_OTHER): Payer: 59 | Admitting: Osteopathic Medicine

## 2020-11-16 VITALS — BP 110/64 | HR 65 | Temp 98.0°F | Wt 261.4 lb

## 2020-11-16 DIAGNOSIS — L989 Disorder of the skin and subcutaneous tissue, unspecified: Secondary | ICD-10-CM | POA: Diagnosis not present

## 2020-11-16 DIAGNOSIS — S39012A Strain of muscle, fascia and tendon of lower back, initial encounter: Secondary | ICD-10-CM

## 2020-11-16 DIAGNOSIS — Z23 Encounter for immunization: Secondary | ICD-10-CM | POA: Diagnosis not present

## 2020-11-16 MED ORDER — CYCLOBENZAPRINE HCL 10 MG PO TABS: 5.0000 mg | ORAL_TABLET | Freq: Three times a day (TID) | ORAL | 1 refills | Status: DC | PRN

## 2020-11-16 MED ORDER — TRAMADOL HCL 50 MG PO TABS: 50.0000 mg | ORAL_TABLET | Freq: Three times a day (TID) | ORAL | 0 refills | Status: DC | PRN

## 2020-11-16 NOTE — Progress Notes (Signed)
Jeffrey Larson is a 64 y.o. male who presents to  [place] at [facility]  today, 11/16/20, seeking care for the following:  . Skin spot concerning on R posterior arm, sore that doesn't seem to be healing well . Strained lower back working on tractor after recently also falling off a horse (!). Glad he's feeling well after his heart procedures! No LE weakenss or sciatica. Requests refill of tramadol which he uses sparingly, PDMP reviewed      ASSESSMENT & PLAN with other pertinent findings:  The primary encounter diagnosis was Skin abnormalities. Diagnoses of Need for influenza vaccination and Strain of lumbar region, initial encounter were also pertinent to this visit.   Skin lesion - uncertain if very weird scab but seem more likely an abnormal growth concerning for malignancy (SCC seems likely) over approx 0.75 cm diameter, area cleaned and anesthetized, lesion removed with dermablade and sent to pathology, hemostasis achieved w/ pressure, silver nitrate cautery, pressure bandage. Post procedure care reviewed w/ patient.   No results found for this or any previous visit (from the past 24 hour(s)).  There are no Patient Instructions on file for this visit.  Orders Placed This Encounter  Procedures  . Flu Vaccine QUAD 6+ mos PF IM (Fluarix Quad PF)    Meds ordered this encounter  Medications  . traMADol (ULTRAM) 50 MG tablet    Sig: Take 1 tablet (50 mg total) by mouth every 8 (eight) hours as needed for moderate pain.    Dispense:  30 tablet    Refill:  0  . cyclobenzaprine (FLEXERIL) 10 MG tablet    Sig: Take 0.5-1 tablets (5-10 mg total) by mouth 3 (three) times daily as needed for muscle spasms. Caution: can cause drowsiness    Dispense:  60 tablet    Refill:  1       Follow-up instructions: Return for [place].                                         BP  110/64   Pulse 65   Temp 98 F (36.7 C)   Wt 261 lb 6.4 oz (118.6 kg)   SpO2 99%   BMI 36.46 kg/m   Current Meds  Medication Sig  . acetaminophen (TYLENOL) 500 MG tablet Take 1,000 mg by mouth 2 (two) times daily.  Marland Kitchen aspirin 81 MG tablet Take 81 mg by mouth daily.  Marland Kitchen atorvastatin (LIPITOR) 40 MG tablet Take 1 tablet (40 mg total) by mouth every evening.  . Fluticasone-Umeclidin-Vilant (TRELEGY ELLIPTA) 100-62.5-25 MCG/INH AEPB Inhale 1 puff into the lungs daily.  . metoprolol succinate (TOPROL XL) 25 MG 24 hr tablet Take 1 tablet (25 mg total) by mouth daily.  . nitroGLYCERIN (NITROSTAT) 0.4 MG SL tablet Place 1 tablet (0.4 mg total) under the tongue every 5 (five) minutes x 3 doses as needed for chest pain.  . Omega-3 Fatty Acids (FISH OIL) 1200 MG CAPS Take 1,200 mg by mouth daily.  . sacubitril-valsartan (ENTRESTO) 24-26 MG Take 1 tablet by mouth 2 (two) times daily.  . Semaglutide, 1 MG/DOSE, (OZEMPIC, 1 MG/DOSE,) 2 MG/1.5ML SOPN Inject 1 mg into the skin once a week.  . sildenafil (REVATIO) 20 MG tablet Take 1 tablet (20 mg total) by mouth 3 (three) times daily.  Marland Kitchen spironolactone (ALDACTONE) 25 MG tablet TAKE  1 TABLET(25 MG) BY MOUTH DAILY  . torsemide (DEMADEX) 20 MG tablet TAKE 2 TABLET BY MOUTH EVERY DAY  . warfarin (COUMADIN) 4 MG tablet TALE 4 MG DAILY EXCEPT TAKE 6MG  ON  SATURDAYS OR AS DIRECTED BY ANTICOAGULATION CLINIC  . [DISCONTINUED] traMADol (ULTRAM) 50 MG tablet Take 1 tablet (50 mg total) by mouth every 8 (eight) hours as needed for moderate pain.    No results found for this or any previous visit (from the past 72 hour(s)).  No results found.     All questions at time of visit were answered - patient instructed to contact office with any additional concerns or updates.  ER/RTC precautions were reviewed with the patient as applicable.   Please note: voice recognition software was used to produce this document, and typos may escape review. Please contact Dr.  for any needed clarifications.

## 2020-11-17 ENCOUNTER — Telehealth: Payer: Self-pay | Admitting: Neurology

## 2020-11-17 ENCOUNTER — Other Ambulatory Visit: Payer: Self-pay | Admitting: Osteopathic Medicine

## 2020-11-17 NOTE — Telephone Encounter (Signed)
Prior Authorization for Ozempic submitted via covermymeds. Awaiting response. [employer] has received your information, and the request will be reviewed. You may close this dialog, return to your dashboard, and perform other tasks.  You will receive an electronic determination in CoverMyMeds. You can see the latest determination by locating this request on your dashboard or by reopening this request. You will also receive a faxed copy of the determination. If you have any questions please contact [employer] at [phone].  If you need assistance, please chat with CoverMyMeds or call us at [phone].

## 2020-11-19 ENCOUNTER — Other Ambulatory Visit: Payer: Self-pay | Admitting: Osteopathic Medicine

## 2020-11-21 ENCOUNTER — Telehealth: Payer: Self-pay | Admitting: Osteopathic Medicine

## 2020-11-21 NOTE — Telephone Encounter (Signed)
Patient advised and scheduled.  

## 2020-11-21 NOTE — Telephone Encounter (Signed)
Received call from [facility]ology regarding biopsy results - concerning for squamous cell carcinoma which is a type of skin cancer. Will need to come back in for wide excision. Please schedule for OV40 end of AM or PM.

## 2020-11-23 ENCOUNTER — Ambulatory Visit (INDEPENDENT_AMBULATORY_CARE_PROVIDER_SITE_OTHER): Payer: 59 | Admitting: Pharmacist

## 2020-11-23 ENCOUNTER — Other Ambulatory Visit: Payer: Self-pay

## 2020-11-23 DIAGNOSIS — Z954 Presence of other heart-valve replacement: Secondary | ICD-10-CM

## 2020-11-23 DIAGNOSIS — I4891 Unspecified atrial fibrillation: Secondary | ICD-10-CM | POA: Diagnosis not present

## 2020-11-23 DIAGNOSIS — Z5181 Encounter for therapeutic drug level monitoring: Secondary | ICD-10-CM | POA: Diagnosis not present

## 2020-11-23 LAB — POCT INR: INR: 2.3 (ref 2.0–3.0)

## 2020-11-23 NOTE — Patient Instructions (Signed)
Description   Take warfarin 1 1/2 tablets tonight then resume 1 tablet daily except 1 1/2 tablets on Saturdays  Recheck INR in 2 wks

## 2020-11-24 ENCOUNTER — Ambulatory Visit (INDEPENDENT_AMBULATORY_CARE_PROVIDER_SITE_OTHER): Payer: 59 | Admitting: Osteopathic Medicine

## 2020-11-24 ENCOUNTER — Encounter: Payer: Self-pay | Admitting: Osteopathic Medicine

## 2020-11-24 ENCOUNTER — Other Ambulatory Visit: Payer: Self-pay | Admitting: Osteopathic Medicine

## 2020-11-24 VITALS — BP 108/71 | HR 59 | Temp 99.0°F | Wt 266.8 lb

## 2020-11-24 DIAGNOSIS — C44622 Squamous cell carcinoma of skin of right upper limb, including shoulder: Secondary | ICD-10-CM | POA: Diagnosis not present

## 2020-11-24 NOTE — Progress Notes (Signed)
  PRE-OP DIAGNOSIS: [place]ouc cell carcinoma requiring wider excision biopsy to ensure negative margins POST-OP DIAGNOSIS: Same  PROCEDURE: excisional skin biopsy Performing Physician: Sunnie Nielsen     The area surrounding the skin lesion was prepared and draped in the usual sterile manner. Area 3 x 4 cm was marked in diamond-shaped pattern to allow at least 4 mm clear margins around original lesion. Area was anesthetized. Scalpel used to outline the diamond-shaped area for removal and skin was removed using forceps and scalpel down to deep dermina/subq adipose tissue. Pt is on anticoagulation so expected bleeding was present but not severe   Closure:      suture - 4.0 vicryl for subq approximation 3 sutures; 4.0 prolene skin closure w/ 7 horizontal mattress and 1 simple interrupted suture   Followup: The patient tolerated the procedure well without complications.  Standard post-procedure care is explained and return precautions are given.    Patient Instructions  Keep area clean w/ soap/water! OK to use peroxide for blood crusting but otherwise no peroxide or alcohol.   Bandage w/ neosporin ok for first 2-3 days, then vaseline.   Call us if any problems!   You have 8 stitches in place         \Return for SUTURE REMOVAL IN 7-8 DAYS, SEE [place] SOONER IF NEEDED! Marland Kitchen

## 2020-11-24 NOTE — Patient Instructions (Signed)
Keep area clean w/ soap/water! OK to use peroxide for blood crusting but otherwise no peroxide or alcohol.   Bandage w/ neosporin ok for first 2-3 days, then vaseline.   Call us if any problems!   You have 8 stitches in place

## 2020-11-29 ENCOUNTER — Telehealth: Payer: Self-pay | Admitting: Cardiology

## 2020-11-29 NOTE — Telephone Encounter (Signed)
Will arrange holding warfarin with Lovenox bridge when surgical procedure is scheduled.

## 2020-11-29 NOTE — Telephone Encounter (Signed)
   [facility] Pre-operative Risk Assessment    HEARTCARE STAFF: - Please ensure there is not already an duplicate clearance open for this procedure. - Under Visit Info/Reason for Call, type in Other and utilize the format Clearance MM/DD/YY or Clearance TBD. Do not use dashes or single digits. - If request is for dental extraction, please clarify the # of teeth to be extracted.  Request for surgical clearance:  1. What type of surgery is being performed? Excision of Anal Canal Polyp  2. When is this surgery scheduled? TBD  3. What type of clearance is required (medical clearance vs. Pharmacy clearance to hold med vs. Both)? Pharmacy Clearance  4. Are there any medications that need to be held prior to surgery and how long? Warfarin and most likely require Lovenox bridge mgmt.  5. Practice name and name of physician performing surgery? [facility]    What is the office phone number? [phone]  7.   What is the office fax number? [phone]  8.   Anesthesia type (None, local, MAC, general) ? General   Nikki T Attaway 11/29/2020, 12:00 PM  _________________________________________________________________   (provider comments below)

## 2020-11-29 NOTE — Telephone Encounter (Signed)
   Primary Cardiologist: Prentice Docker, MD (Inactive)  Chart reviewed as part of pre-operative protocol coverage. We have been asked for anticoagulation guidance prior to procedure. Below is the outline per our pre-op pharmacy team:  Date of procedure: TBD  CHA2DS2-VASc Score = 4  This indicates a 4.8% annual risk of stroke. The patient's score is based upon: CHF History: Yes HTN History: Yes Diabetes History: Yes Stroke History: No Vascular Disease History: Yes Age Score: 0 Gender Score: 0  CrCl 93 mL/min using adjusted body weight Platelet count 309K  Per office protocol, patient can hold warfarin for 5 days prior to procedure.   Patient WILL need bridging with Lovenox (enoxaparin) around procedure.  Per chart review, once procedure is scheduled, Vashti Hey RN will be coordinating Lovenox bridge.    I will route this recommendation to the requesting party via Epic fax function and remove from pre-op pool.  Please call with questions.  Georgie Chard, NP 11/29/2020, 1:19 PM

## 2020-11-29 NOTE — Telephone Encounter (Signed)
Patient with diagnosis of A Fib with metallic mitral valve on warfarin for anticoagulation.    1. Procedure: Excision of Anal Canal Polyp  Date of procedure: TBD  CHA2DS2-VASc Score = 4  This indicates a 4.8% annual risk of stroke. The patient's score is based upon: CHF History: Yes HTN History: Yes Diabetes History: Yes Stroke History: No Vascular Disease History: Yes Age Score: 0 Gender Score: 0  CrCl 93 mL/min using adjusted body weight Platelet count 309K  Per office protocol, patient can hold warfarin for 5 days prior to procedure.   Patient WILL need bridging with Lovenox (enoxaparin) around procedure.  Will forward to Vashti Hey RN to organize Lovenox bridge

## 2020-12-01 ENCOUNTER — Other Ambulatory Visit: Payer: Self-pay

## 2020-12-01 ENCOUNTER — Encounter: Payer: Self-pay | Admitting: Nurse Practitioner

## 2020-12-01 ENCOUNTER — Ambulatory Visit (INDEPENDENT_AMBULATORY_CARE_PROVIDER_SITE_OTHER): Payer: 59 | Admitting: Nurse Practitioner

## 2020-12-01 VITALS — BP 100/61 | HR 74 | Temp 98.0°F | Ht 71.0 in | Wt 261.6 lb

## 2020-12-01 DIAGNOSIS — Z4802 Encounter for removal of sutures: Secondary | ICD-10-CM | POA: Diagnosis not present

## 2020-12-01 DIAGNOSIS — E119 Type 2 diabetes mellitus without complications: Secondary | ICD-10-CM

## 2020-12-01 NOTE — Progress Notes (Signed)
Established Patient Office Visit  Subjective:  Patient ID: Jeffrey Larson, male    DOB: 05/28/1956  Age: 65 y.o. MRN: [id number]  CC:  Chief Complaint  Patient presents with  . Suture / Staple Removal    Right arm    HPI Jeffrey Larson presents for sutural removal of the posterior portion of the proximal right arm.  He denies any drainage, redness, itching, or swelling noted from the area. He reports that the course has been uneventful.  He does tell me that he has lost from 315 to 261 pounds after being started on Ozempic and his blood sugars have been good. He was recently told that his insurance would not continue to cover the medication if the dosage is not increased.   He is preparing to have a second colonoscopy in the near future- he recently had 8 polyps removed and was referred to a surgeon due to concerning findings. He tells me the surgeon plans to go in and remove a small area with possibly some removal of the bowel.   Past Medical History:  Diagnosis Date  . Atrial fibrillation ([place])   . Chronic combined systolic and diastolic congestive heart failure ([place])   . COPD with chronic bronchitis ([place])   . Coronary artery disease   . COVID-19 virus infection   . Essential hypertension, benign    Ramipril to losartan Sept 2015   . History of pneumonia    RML on CXR 07/20/14  . Hyperlipidemia   . Longstanding persistent atrial fibrillation ([place])   . Mitral stenosis with regurgitation   . Myocardial infarction ([place])   . Obesity   . Pulmonary hypertension ([place])   . Renal disorder    history kidney stone  . S/P Maze operation for atrial fibrillation 07/18/2018   Complete bilateral atrial lesion set using bipolar radiofrequency and cryothermy ablation with clipping of LA appendage  . S/P mitral valve replacement with metallic valve 0000000   Sorin Carbomedics Optiform bileaflet mechanical valve, size 33 mm  . S/P TVR (tricuspid valve repair) 07/18/2018   Edwards mc3  ring annuloplasty, size 30 mm    Past Surgical History:  Procedure Laterality Date  . APPENDECTOMY     as a child  . CARDIOVASCULAR STRESS TEST  03/2014   Borderline reversible ischemic changes at the apex.  Normal LV contractility/EF 52%.  . CORONARY STENT PLACEMENT  7/12013  . LEFT HEART CATHETERIZATION WITH CORONARY ANGIOGRAM N/A 06/12/2012   Procedure: LEFT HEART CATHETERIZATION WITH CORONARY ANGIOGRAM;  Surgeon: Clent Demark, MD;  Location: [facility] CATH LAB;  Service: Cardiovascular;  Laterality: N/A;  . MAZE N/A 07/18/2018   Procedure: MAZE;  Surgeon: Rexene Alberts, MD;  Location: [place];  Service: Open Heart Surgery;  Laterality: N/A;  . MITRAL VALVE REPLACEMENT N/A 07/18/2018   Procedure: MITRAL VALVE (MV) REPLACEMENT WITH CARBOMEDICS OPTIFORM MITRAL VALVE SIZE 33.;  Surgeon: Rexene Alberts, MD;  Location: [place];  Service: Open Heart Surgery;  Laterality: N/A;  . PERCUTANEOUS CORONARY STENT INTERVENTION (PCI-S) N/A 06/17/2012   Procedure: PERCUTANEOUS CORONARY STENT INTERVENTION (PCI-S);  Surgeon: Clent Demark, MD;  Location: [facility] CATH LAB;  Service: Cardiovascular;  Laterality: N/A;  . RIGHT/LEFT HEART CATH AND CORONARY ANGIOGRAPHY N/A 06/10/2018   Procedure: RIGHT/LEFT HEART CATH AND CORONARY ANGIOGRAPHY;  Surgeon: Dixie Dials, MD;  Location: [place] CV LAB;  Service: Cardiovascular;  Laterality: N/A;  . TEE WITHOUT CARDIOVERSION N/A 06/10/2018   Procedure: TRANSESOPHAGEAL ECHOCARDIOGRAM (TEE) with bubble  study;  Surgeon: Dixie Dials, MD;  Location: [facility] ENDOSCOPY;  Service: Cardiovascular;  Laterality: N/A;  . TEE WITHOUT CARDIOVERSION N/A 07/18/2018   Procedure: TRANSESOPHAGEAL ECHOCARDIOGRAM (TEE);  Surgeon: Rexene Alberts, MD;  Location: [place];  Service: Open Heart Surgery;  Laterality: N/A;  . TRICUSPID VALVE REPLACEMENT N/A 07/18/2018   Procedure: TRICUSPID VALVE REPAIR WITH EDWARDS MC3 TRICUSPID ANNULOPLASTY RING SIZE 30.;  Surgeon: Rexene Alberts, MD;  Location: [place];   Service: Open Heart Surgery;  Laterality: N/A;    Family History  Problem Relation Age of Onset  . Heart disease Mother   . Heart disease Father   . Diabetes Father   . Diabetes Brother   . Colon cancer Neg Hx   . Esophageal cancer Neg Hx   . Stomach cancer Neg Hx     Social History   Socioeconomic History  . Marital status: Married    Spouse name: Not on file  . Number of children: Not on file  . Years of education: Not on file  . Highest education level: Not on file  Occupational History  . Not on file  Tobacco Use  . Smoking status: Former Smoker    Packs/day: 2.00    Years: 15.00    Pack years: 30.00    Types: Cigarettes    Quit date: 11/26/1996    Years since quitting: 24.0  . Smokeless tobacco: Never Used  Vaping Use  . Vaping Use: Never used  Substance and Sexual Activity  . Alcohol use: No  . Drug use: No  . Sexual activity: Yes  Other Topics Concern  . Not on file  Social History Narrative  . Not on file   Social Determinants of Health   Financial Resource Strain: Not on file  Food Insecurity: Not on file  Transportation Needs: Not on file  Physical Activity: Not on file  Stress: Not on file  Social Connections: Not on file  Intimate Partner Violence: Not on file    Outpatient Medications Prior to Visit  Medication Sig Dispense Refill  . acetaminophen (TYLENOL) 500 MG tablet Take 1,000 mg by mouth 2 (two) times daily.    Marland Kitchen aspirin 81 MG tablet Take 81 mg by mouth daily.    Marland Kitchen atorvastatin (LIPITOR) 40 MG tablet Take 1 tablet (40 mg total) by mouth every evening. 90 tablet 3  . cyclobenzaprine (FLEXERIL) 10 MG tablet Take 0.5-1 tablets (5-10 mg total) by mouth 3 (three) times daily as needed for muscle spasms. Caution: can cause drowsiness 60 tablet 1  . enoxaparin (LOVENOX) 120 MG/0.8ML injection Inject 0.8 mLs (120 mg total) into the skin every 12 (twelve) hours for 3 days. 4.8 mL 0  . metoprolol succinate (TOPROL XL) 25 MG 24 hr tablet Take 1  tablet (25 mg total) by mouth daily. 90 tablet 3  . nitroGLYCERIN (NITROSTAT) 0.4 MG SL tablet Place 1 tablet (0.4 mg total) under the tongue every 5 (five) minutes x 3 doses as needed for chest pain. 25 tablet 3  . Omega-3 Fatty Acids (FISH OIL) 1200 MG CAPS Take 1,200 mg by mouth daily.    . sacubitril-valsartan (ENTRESTO) 24-26 MG Take 1 tablet by mouth 2 (two) times daily. 180 tablet 3  . Semaglutide, 1 MG/DOSE, (OZEMPIC, 1 MG/DOSE,) 2 MG/1.5ML SOPN Inject 1 mg into the skin once a week. 5 pen 11  . sildenafil (REVATIO) 20 MG tablet Take 1 tablet (20 mg total) by mouth 3 (three) times daily. 270 tablet 3  .  spironolactone (ALDACTONE) 25 MG tablet TAKE 1 TABLET(25 MG) BY MOUTH DAILY 90 tablet 1  . torsemide (DEMADEX) 20 MG tablet TAKE 2 TABLET BY MOUTH EVERY DAY 60 tablet 11  . traMADol (ULTRAM) 50 MG tablet Take 1 tablet (50 mg total) by mouth every 8 (eight) hours as needed for moderate pain. 30 tablet 0  . TRELEGY ELLIPTA 100-62.5-25 MCG/INH AEPB INHALE 1 PUFF INTO THE LUNGS DAILY 120 each PRN  . warfarin (COUMADIN) 4 MG tablet TALE 4 MG DAILY EXCEPT TAKE 6MG  ON  SATURDAYS OR AS DIRECTED BY ANTICOAGULATION CLINIC 120 tablet 2   No facility-administered medications prior to visit.    Allergies  Allergen Reactions  . Ramipril Cough    cough  . Percocet [Oxycodone-Acetaminophen]     In ICU it contributed to hallucinations. Can take by itself    ROS Review of Systems All review of systems negative except what is listed in the HPI    Objective:    Physical Exam Vitals and nursing note reviewed.  Constitutional:      Appearance: Normal appearance.  HENT:     Head: Normocephalic.  Eyes:     Extraocular Movements: Extraocular movements intact.     Conjunctiva/sclera: Conjunctivae normal.     Pupils: Pupils are equal, round, and reactive to light.  Cardiovascular:     Rate and Rhythm: Normal rate.     Pulses: Normal pulses.  Pulmonary:     Effort: Pulmonary effort is normal.   Musculoskeletal:        General: Normal range of motion.     Cervical back: Normal range of motion.  Skin:    General: Skin is warm and dry.     Capillary Refill: Capillary refill takes less than 2 seconds.       Neurological:     General: No focal deficit present.     Mental Status: He is alert and oriented to person, place, and time.  Psychiatric:        Mood and Affect: Mood normal.        Behavior: Behavior normal.        Thought Content: Thought content normal.        Judgment: Judgment normal.     BP 100/61   Pulse 74   Temp 98 F (36.7 C)   Ht 5\' 11"  (1.803 m)   Wt 261 lb 9.6 oz (118.7 kg)   SpO2 99%   BMI 36.49 kg/m  Wt Readings from Last 3 Encounters:  12/01/20 261 lb 9.6 oz (118.7 kg)  11/24/20 266 lb 12.8 oz (121 kg)  11/16/20 261 lb 6.4 oz (118.6 kg)     There are no preventive care reminders to display for this patient.  There are no preventive care reminders to display for this patient.  Lab Results  Component Value Date   TSH 1.30 12/08/2018   Lab Results  Component Value Date   WBC 9.2 06/20/2020   HGB 13.2 06/20/2020   HCT 40.2 06/20/2020   MCV 86.1 06/20/2020   PLT 309 06/20/2020   Lab Results  Component Value Date   NA 140 06/20/2020   K 4.3 06/20/2020   CO2 26 06/20/2020   GLUCOSE 105 (H) 06/20/2020   BUN 17 06/20/2020   CREATININE 1.06 06/20/2020   BILITOT 0.5 06/20/2020   ALKPHOS 51 07/31/2019   AST 15 06/20/2020   ALT 18 06/20/2020   PROT 6.9 06/20/2020   ALBUMIN 3.2 (L) 07/31/2019   CALCIUM 9.2  06/20/2020   ANIONGAP 12 07/31/2019   Lab Results  Component Value Date   CHOL 118 06/20/2020   Lab Results  Component Value Date   HDL 30 (L) 06/20/2020   Lab Results  Component Value Date   LDLCALC 61 06/20/2020   Lab Results  Component Value Date   TRIG 197 (H) 06/20/2020   Lab Results  Component Value Date   CHOLHDL 3.9 06/20/2020   Lab Results  Component Value Date   HGBA1C 5.1 06/20/2020       Assessment & Plan:   1. Visit for suture removal Procedure: Area cleansed with betadine and allowed to dry. A small amount of triple antibiotic ointment placed over the sutures to help facilitate easier removal.  A total of 8 sutures removed from the skin with no bleeding or drainage present.  Edges well approximated with no signs of dehiscence or infection.  Area wiped with sterile gauze and thin layer of triple antibiotic ointment placed over the suture holes.  Three steri strips placed for added protection and entire area covered with sterile gauze and paper dressing.  Instructions provided for dressing removal and monitoring of site for infection or dehiscence.   2. Controlled type 2 diabetes mellitus without complication, without long-term current use of insulin (HCC) 3. Morbid obesity (HCC) Blood sugars well controlled on Ozempic 1mg  dose. He has done excellent with weight loss due to this product, as well. At current time, it appears that this dosage is still covered under the patients current plan. Will review this information and make changes to dosage as applicable to aid in continued coverage- if increased dosing is required patient will need close blood glucose monitoring. Will update as information is determined.    Follow-up: Return if symptoms worsen or fail to improve.    , NP

## 2020-12-01 NOTE — Patient Instructions (Signed)
Suture Removal, Care After This sheet gives you information about how to care for yourself after your procedure. Your health care provider may also give you more specific instructions. If you have problems or questions, contact your health care provider. What can I expect after the procedure? After your stitches (sutures) are removed, it is common to have:  Some discomfort and swelling in the area.  Slight redness in the area. Follow these instructions at home: If you have a bandage:  Wash your hands with soap and water before you change your bandage (dressing). If soap and water are not available, use hand sanitizer.  Change your dressing as told by your health care provider. If your dressing becomes wet or dirty, or develops a bad smell, change it as soon as possible.  If your dressing sticks to your skin, soak it in warm water to loosen it. Wound care   Check your wound every day for signs of infection. Check for: ? More redness, swelling, or pain. ? Fluid or blood. ? Warmth. ? Pus or a bad smell.  Wash your hands with soap and water before and after touching your wound.  Apply cream or ointment only as directed by your health care provider. If you are using cream or ointment, wash the area with soap and water 2 times a day to remove all the cream or ointment. Rinse off the soap and pat the area dry with a clean towel.  If you have skin glue or adhesive strips on your wound, leave these closures in place. They may need to stay in place for 2 weeks or longer. If adhesive strip edges start to loosen and curl up, you may trim the loose edges. Do not remove adhesive strips completely unless your health care provider tells you to do that.  Keep the wound area dry and clean. Do not take baths, swim, or use a hot tub until your health care provider approves.  Continue to protect the wound from injury.  Do not pick at your wound. Picking can cause an infection.  When your wound has  completely healed, wear sunscreen over it or cover it with clothing when you are outside. New scars get sunburned easily, which can make scarring worse. General instructions  Take over-the-counter and prescription medicines only as told by your health care provider.  Keep all follow-up visits as told by your health care provider. This is important. Contact a health care provider if:  You have redness, swelling, or pain around your wound.  You have fluid or blood coming from your wound.  Your wound feels warm to the touch.  You have pus or a bad smell coming from your wound.  Your wound opens up. Get help right away if:  You have a fever.  You have redness that is spreading from your wound. Summary  After your sutures are removed, it is common to have some discomfort and swelling in the area.  Wash your hands with soap and water before you change your bandage (dressing).  Keep the wound area dry and clean. Do not take baths, swim, or use a hot tub until your health care provider approves. This information is not intended to replace advice given to you by your health care provider. Make sure you discuss any questions you have with your health care provider. Document Revised: 10/25/2017 Document Reviewed: 12/18/2016 Elsevier Patient Education  2020 Elsevier Inc.  

## 2020-12-02 ENCOUNTER — Ambulatory Visit (INDEPENDENT_AMBULATORY_CARE_PROVIDER_SITE_OTHER): Payer: 59 | Admitting: Cardiology

## 2020-12-02 ENCOUNTER — Telehealth: Payer: Self-pay | Admitting: Genetic Counselor

## 2020-12-02 ENCOUNTER — Encounter: Payer: Self-pay | Admitting: Cardiology

## 2020-12-02 VITALS — BP 118/78 | HR 75 | Ht 71.0 in | Wt 250.6 lb

## 2020-12-02 DIAGNOSIS — I4891 Unspecified atrial fibrillation: Secondary | ICD-10-CM | POA: Diagnosis not present

## 2020-12-02 DIAGNOSIS — I2510 Atherosclerotic heart disease of native coronary artery without angina pectoris: Secondary | ICD-10-CM

## 2020-12-02 DIAGNOSIS — Z954 Presence of other heart-valve replacement: Secondary | ICD-10-CM | POA: Diagnosis not present

## 2020-12-02 DIAGNOSIS — E782 Mixed hyperlipidemia: Secondary | ICD-10-CM

## 2020-12-02 NOTE — Patient Instructions (Signed)
Medication Instructions:  Your physician recommends that you continue on your current medications as directed. Please refer to the Current Medication list given to you today.  *If you need a refill on your cardiac medications before your next appointment, please call your pharmacy*   Lab Work: NONE   If you have labs (blood work) drawn today and your tests are completely normal, you will receive your results only by: Marland Kitchen MyChart Message (if you have MyChart) OR . A paper copy in the mail If you have any lab test that is abnormal or we need to change your treatment, we will call you to review the results.   Testing/Procedures: NONE    Follow-Up: At [facility], you and your health needs are our priority.  As part of our continuing mission to provide you with exceptional heart care, we have created designated Provider Care Teams.  These Care Teams include your primary Cardiologist (physician) and Advanced Practice Providers (APPs -  Physician Assistants and Nurse Practitioners) who all work together to provide you with the care you need, when you need it.  We recommend signing up for the patient portal called "MyChart".  Sign up information is provided on this After Visit Summary.  MyChart is used to connect with patients for Virtual Visits (Telemedicine).  Patients are able to view lab/test results, encounter notes, upcoming appointments, etc.  Non-urgent messages can be sent to your provider as well.   To learn more about what you can do with MyChart, go to [email or web address].    Your next appointment:   6 month(s)  The format for your next appointment:   In Person  Provider:   You may see Dr. Harl Bowie or one of the following Advanced Practice Providers on your designated Care Team:    Bernerd Pho, PA-C   Ermalinda Barrios, PA-C     Other Instructions Thank you for choosing [facility]!

## 2020-12-02 NOTE — Progress Notes (Signed)
Clinical Summary Mr. Bauernfeind is a 65 y.o.male seen today for follow up of the following medical problems.    1. History of MVR for rheumatic mitral stenosis - On 07/18/2018 he underwent mitral valve replacement with mechanical mitral valve, tricuspid valverepair using anEdwards mc3 ring annuloplasty, and Maze procedure -Sorin Carbomedics Optiform bileaflet 33 mm mechanical valveon 07/18/2018.  - Jan 2020 echo LVEF 55-60%, normal MVR - no recent SOB/DOE - compliant with meds. No bleeding on coumadin.    2. Permanent afib - no recent palpitations.   3. CAD - MI in 2013 with stenting  - He underwent cardiac catheterization on 06/10/2018. He had nonobstructive disease with a mid RCA 50% stenosis and otherwise 20% stenosis seen in the proximal left circumflex and ostial to proximal LAD.  - no recent chest pain  4. Chronic diastolic HF - no recent edema - taking torsemide 20mg  daily.     5. COPD   6. COVID in 06/2019  7. Pulmonary HTN - has been on sildenafile  8. HTN - compliant with meds  9. Hyperlipidemia - LDL has been at goal, was 61 in 05/2020   SH: has had pfizer vaccine x 3 Past Medical History:  Diagnosis Date  . Atrial fibrillation ([place])   . Chronic combined systolic and diastolic congestive heart failure ([place])   . COPD with chronic bronchitis ([place])   . Coronary artery disease   . COVID-19 virus infection   . Essential hypertension, benign    Ramipril to losartan Sept 2015   . History of pneumonia    RML on CXR 07/20/14  . Hyperlipidemia   . Longstanding persistent atrial fibrillation ([place])   . Mitral stenosis with regurgitation   . Myocardial infarction ([place])   . Obesity   . Pulmonary hypertension ([place])   . Renal disorder    history kidney stone  . S/P Maze operation for atrial fibrillation 07/18/2018   Complete bilateral atrial lesion set using bipolar radiofrequency and cryothermy ablation with clipping of LA appendage  . S/P mitral  valve replacement with metallic valve 0000000   Sorin Carbomedics Optiform bileaflet mechanical valve, size 33 mm  . S/P TVR (tricuspid valve repair) 07/18/2018   Edwards mc3 ring annuloplasty, size 30 mm     Allergies  Allergen Reactions  . Ramipril Cough    cough  . Percocet [Oxycodone-Acetaminophen]     In ICU it contributed to hallucinations. Can take by itself     Current Outpatient Medications  Medication Sig Dispense Refill  . acetaminophen (TYLENOL) 500 MG tablet Take 1,000 mg by mouth 2 (two) times daily.    Marland Kitchen aspirin 81 MG tablet Take 81 mg by mouth daily.    Marland Kitchen atorvastatin (LIPITOR) 40 MG tablet Take 1 tablet (40 mg total) by mouth every evening. 90 tablet 3  . cyclobenzaprine (FLEXERIL) 10 MG tablet Take 0.5-1 tablets (5-10 mg total) by mouth 3 (three) times daily as needed for muscle spasms. Caution: can cause drowsiness 60 tablet 1  . enoxaparin (LOVENOX) 120 MG/0.8ML injection Inject 0.8 mLs (120 mg total) into the skin every 12 (twelve) hours for 3 days. 4.8 mL 0  . metoprolol succinate (TOPROL XL) 25 MG 24 hr tablet Take 1 tablet (25 mg total) by mouth daily. 90 tablet 3  . nitroGLYCERIN (NITROSTAT) 0.4 MG SL tablet Place 1 tablet (0.4 mg total) under the tongue every 5 (five) minutes x 3 doses as needed for chest pain. 25 tablet 3  .  Omega-3 Fatty Acids (FISH OIL) 1200 MG CAPS Take 1,200 mg by mouth daily.    . sacubitril-valsartan (ENTRESTO) 24-26 MG Take 1 tablet by mouth 2 (two) times daily. 180 tablet 3  . Semaglutide, 1 MG/DOSE, (OZEMPIC, 1 MG/DOSE,) 2 MG/1.5ML SOPN Inject 1 mg into the skin once a week. 5 pen 11  . sildenafil (REVATIO) 20 MG tablet Take 1 tablet (20 mg total) by mouth 3 (three) times daily. 270 tablet 3  . spironolactone (ALDACTONE) 25 MG tablet TAKE 1 TABLET(25 MG) BY MOUTH DAILY 90 tablet 1  . torsemide (DEMADEX) 20 MG tablet TAKE 2 TABLET BY MOUTH EVERY DAY 60 tablet 11  . traMADol (ULTRAM) 50 MG tablet Take 1 tablet (50 mg total) by mouth  every 8 (eight) hours as needed for moderate pain. 30 tablet 0  . TRELEGY ELLIPTA 100-62.5-25 MCG/INH AEPB INHALE 1 PUFF INTO THE LUNGS DAILY 120 each PRN  . warfarin (COUMADIN) 4 MG tablet TALE 4 MG DAILY EXCEPT TAKE 6MG  ON  SATURDAYS OR AS DIRECTED BY ANTICOAGULATION CLINIC 120 tablet 2   No current facility-administered medications for this visit.     Past Surgical History:  Procedure Laterality Date  . APPENDECTOMY     as a child  . CARDIOVASCULAR STRESS TEST  03/2014   Borderline reversible ischemic changes at the apex.  Normal LV contractility/EF 52%.  . CORONARY STENT PLACEMENT  7/12013  . LEFT HEART CATHETERIZATION WITH CORONARY ANGIOGRAM N/A 06/12/2012   Procedure: LEFT HEART CATHETERIZATION WITH CORONARY ANGIOGRAM;  Surgeon: Clent Demark, MD;  Location: [facility] CATH LAB;  Service: Cardiovascular;  Laterality: N/A;  . MAZE N/A 07/18/2018   Procedure: MAZE;  Surgeon: Rexene Alberts, MD;  Location: [place];  Service: Open Heart Surgery;  Laterality: N/A;  . MITRAL VALVE REPLACEMENT N/A 07/18/2018   Procedure: MITRAL VALVE (MV) REPLACEMENT WITH CARBOMEDICS OPTIFORM MITRAL VALVE SIZE 33.;  Surgeon: Rexene Alberts, MD;  Location: [place];  Service: Open Heart Surgery;  Laterality: N/A;  . PERCUTANEOUS CORONARY STENT INTERVENTION (PCI-S) N/A 06/17/2012   Procedure: PERCUTANEOUS CORONARY STENT INTERVENTION (PCI-S);  Surgeon: Clent Demark, MD;  Location[facility] C[facility] LAB;  Service: Cardiovascular;  Laterality: N/A;  . RIGHT/LEFT HEART CATH AND CORONARY ANGIOGRAPHY N/A 06/10/2018   Procedure: RIGHT/LEFT HEART CATH AND CORONARY ANGIOGRAPHY;  Surgeon: Dixie Dials, MD;  Location: [place] CV LAB;  Service: Cardiovascular;  Laterality: N/A;  . TEE WITHOUT CARDIOVERSION N/A 06/10/2018   Procedure: TRANSESOPHAGEAL ECHOCARDIOGRAM (TEE) with bubble study;  Surgeon: Dixie Dials, MD;  Location: [facility] ENDOSCOPY;  Service: Cardiovascular;  Laterality: N/A;  . TEE WITHOUT CARDIOVERSION N/A 07/18/2018    Procedure: TRANSESOPHAGEAL ECHOCARDIOGRAM (TEE);  Surgeon: Rexene Alberts, MD;  Location: [place];  Service: Open Heart Surgery;  Laterality: N/A;  . TRICUSPID VALVE REPLACEMENT N/A 07/18/2018   Procedure: TRICUSPID VALVE REPAIR WITH EDWARDS MC3 TRICUSPID ANNULOPLASTY RING SIZE 30.;  Surgeon: Rexene Alberts, MD;  Location: [place];  Service: Open Heart Surgery;  Laterality: N/A;     Allergies  Allergen Reactions  . Ramipril Cough    cough  . Percocet [Oxycodone-Acetaminophen]     In ICU it contributed to hallucinations. Can take by itself      Family History  Problem Relation Age of Onset  . Heart disease Mother   . Heart disease Father   . Diabetes Father   . Diabetes Brother   . Colon cancer Neg Hx   . Esophageal cancer Neg Hx   .  Stomach cancer Neg Hx      Social History Mr. Frein reports that he quit smoking about 24 years ago. His smoking use included cigarettes. He has a 30.00 pack-year smoking history. He has never used smokeless tobacco. Mr. Schueler reports no history of alcohol use.   Review of Systems CONSTITUTIONAL: No weight loss, fever, chills, weakness or fatigue.  HEENT: Eyes: No visual loss, blurred vision, double vision or yellow sclerae.No hearing loss, sneezing, congestion, runny nose or sore throat.  SKIN: No rash or itching.  CARDIOVASCULAR:per hpi  RESPIRATORY: No shortness of breath, cough or sputum.  GASTROINTESTINAL: No anorexia, nausea, vomiting or diarrhea. No abdominal pain or blood.  GENITOURINARY: No burning on urination, no polyuria NEUROLOGICAL: No headache, dizziness, syncope, paralysis, ataxia, numbness or tingling in the extremities. No change in bowel or bladder control.  MUSCULOSKELETAL: No muscle, back pain, joint pain or stiffness.  LYMPHATICS: No enlarged nodes. No history of splenectomy.  PSYCHIATRIC: No history of depression or anxiety.  ENDOCRINOLOGIC: No reports of sweating, cold or heat intolerance. No polyuria or  polydipsia.  Marland Kitchen   Physical Examination Today's Vitals   12/02/20 1042  BP: 118/78  Pulse: 75  SpO2: 99%  Weight: 250 lb 9.6 oz (113.7 kg)  Height: 5\' 11"  (1.803 m)   Body mass index is 34.95 kg/m.  Gen: resting comfortably, no acute distress HEENT: no scleral icterus, pupils equal round and reactive, no palptable cervical adenopathy,  CV: RRR, mechanical S1, no m/r/g Resp: Clear to auscultation bilaterally GI: abdomen is soft, non-tender, non-distended, normal bowel sounds, no hepatosplenomegaly MSK: extremities are warm, no edema.  Skin: warm, no rash Neuro:  no focal deficits Psych: appropriate affect   Diagnostic Studies     Assessment and Plan  1. History of MV replacment/mechnical MV - no recent symptoms - normal function by last echo - continue coumadin, ASA in setting of mechanical MV  2. Afib - no symptoms, continue current meds  3. Hyperlipidemia - LDL has been at goal,continue current meds. Working on weight loss and dietary modification to help with TGs  4. CAD - no symptoms, continue current meds  5. Systolic dysfunction - LVEF previously 35-40% 05/2018, has since normalized.  - continue medical therapy  6. Pulmonary HTN - has been on sildenafil per CHF clinic, continue      Arnoldo Lenis, M.D.

## 2020-12-02 NOTE — Telephone Encounter (Signed)
Received a genetic counseling referral from Dr. Dema Severin for anal polyp. Jeffrey Larson has been cld and scheduled for a mychart video visit w/Emily on 1/25 at 1pm. I verified that the pt has an active mychart acct.

## 2020-12-07 ENCOUNTER — Ambulatory Visit (INDEPENDENT_AMBULATORY_CARE_PROVIDER_SITE_OTHER): Payer: 59 | Admitting: *Deleted

## 2020-12-07 DIAGNOSIS — I4891 Unspecified atrial fibrillation: Secondary | ICD-10-CM | POA: Diagnosis not present

## 2020-12-07 DIAGNOSIS — Z5181 Encounter for therapeutic drug level monitoring: Secondary | ICD-10-CM | POA: Diagnosis not present

## 2020-12-07 DIAGNOSIS — Z954 Presence of other heart-valve replacement: Secondary | ICD-10-CM

## 2020-12-07 LAB — POCT INR: INR: 2.3 (ref 2.0–3.0)

## 2020-12-07 NOTE — Patient Instructions (Addendum)
Increase warfarin to 1 tablet daily except 1 1/2 tablets on Wednesdays and Saturdays Recheck INR in 3 wks Pending Anal polyp surgery.  Date TBD  Will need lovenox bridge

## 2020-12-08 ENCOUNTER — Telehealth: Payer: Self-pay | Admitting: *Deleted

## 2020-12-08 ENCOUNTER — Other Ambulatory Visit: Payer: Self-pay | Admitting: *Deleted

## 2020-12-08 MED ORDER — ENOXAPARIN SODIUM 120 MG/0.8ML ~~LOC~~ SOLN: 120.0000 mg | Freq: Two times a day (BID) | SUBCUTANEOUS | 0 refills | Status: DC

## 2020-12-08 NOTE — Telephone Encounter (Signed)
Received message from Dr Harl Bowie that pt's anal polyp surgery has been scheduled for 12/15/20.  Pt will hold warfarin 5 days before procedure and bridge with Lovenox 120mg  twice daily.  Lovenox 120mg  SQ #20 syringes sent to [facility] Dr.  Linna Hoff  1/14  Take last dose of warfarin 1/15  No Lovenox or warfarin 1/16 - 1/18  Lovenox 120mg  sq @ 8am and 8PM 1/19  Lovenox 120mg  8am and NO Lovenox in pm 1/20  No Lovenox in am ------surgery ------warfarin 7.5mg  pm 1/21 - 1/25  Lovenox 120mg  at 8am and 8pm along with warfarin 7.5mg  in pm 1/26  Lovenox 120mg  at 8am and INR appt at 8:45am  Copy of lovenox schedule given to pt and he verbalized understanding.

## 2020-12-12 ENCOUNTER — Other Ambulatory Visit (HOSPITAL_COMMUNITY): Payer: 59

## 2020-12-13 ENCOUNTER — Other Ambulatory Visit (HOSPITAL_COMMUNITY)
Admission: RE | Admit: 2020-12-13 | Discharge: 2020-12-13 | Disposition: A | Discharge status: Home / Self Care | Payer: 59 | Source: Ambulatory Visit | Attending: Surgery | Admitting: Surgery

## 2020-12-13 DIAGNOSIS — Z01818 Encounter for other preprocedural examination: Secondary | ICD-10-CM | POA: Diagnosis present

## 2020-12-13 DIAGNOSIS — Z20822 Contact with and (suspected) exposure to covid-19: Secondary | ICD-10-CM | POA: Diagnosis not present

## 2020-12-13 LAB — SARS CORONAVIRUS 2 (TAT 6-24 HRS): SARS Coronavirus 2: NEGATIVE

## 2020-12-14 ENCOUNTER — Ambulatory Visit: Payer: Self-pay | Admitting: Surgery

## 2020-12-14 ENCOUNTER — Encounter (HOSPITAL_BASED_OUTPATIENT_CLINIC_OR_DEPARTMENT_OTHER): Payer: Self-pay | Admitting: Surgery

## 2020-12-14 ENCOUNTER — Other Ambulatory Visit: Payer: Self-pay

## 2020-12-14 NOTE — Progress Notes (Signed)
Spoke w/ via phone for pre-op interview---pt Lab needs dos----   I stat 8, pt            COVID test ------12-13-2020 1200 Arrive at -------700 am 12-15-2020 NPO after MN NO Solid Food.  Clear liquids from MN until---600 am then npo Medications to take morning of surgery -----trelegy ellita inhaler, metorpolol succinate, entresto, sildenafil Diabetic medication -----n/a Patient Special Instructions -----none Pre-Op special Istructions -----none Patient verbalized understanding of instructions that were given at this phone interview. Patient denies shortness of breath, chest pain, fever, cough at this phone interview.  Anesthesia Review: chart review by Janett Billow zanetto pa and patient is candidate for wlsc, hx of cad, stents x 2, mi, chf, valve repair with mays procedure, afib, pulmonary htn  PCP: natalie alexander do Cardiologist : lov cardiology rhonda barrett pa 06-17-2020 epic, jill mcdaniel np cardiac clearance note 11-29-2020 epic, anticoagulation note lisa reid rn 12-17-2020 for lovenox bridge instructions Chest x-ray :none EKG :06-17-2020 epic Echo :12-01-2018 epic Stress test:none Cardiac Cath : 06-10-2018 epic Activity level: does housework and yard work without problems, climbs steps without sob,  Sleep Study/ CPAP :n/a Fasting Blood Sugar :      / Checks Blood Sugar -- times a day:  n/a Blood Thinner/ Instructions /Last Dose: stop warfarine 5 days prior to surgery and bridge with lovenox with no lovenos dose am of surgery per lisa reid rn bridging note 12-07-20 epic ASA / Instructions/ Last Dose : staying on 81 mg aspirin last dose will be 12-14-2020 per dr white

## 2020-12-15 ENCOUNTER — Ambulatory Visit (HOSPITAL_BASED_OUTPATIENT_CLINIC_OR_DEPARTMENT_OTHER): Payer: 59 | Admitting: Physician Assistant

## 2020-12-15 ENCOUNTER — Encounter (HOSPITAL_BASED_OUTPATIENT_CLINIC_OR_DEPARTMENT_OTHER)
Admission: RE | Disposition: A | Discharge status: Home / Self Care | Payer: Self-pay | Source: Home / Self Care | Attending: Surgery

## 2020-12-15 ENCOUNTER — Other Ambulatory Visit: Payer: Self-pay

## 2020-12-15 ENCOUNTER — Ambulatory Visit (HOSPITAL_BASED_OUTPATIENT_CLINIC_OR_DEPARTMENT_OTHER)
Admission: RE | Admit: 2020-12-15 | Discharge: 2020-12-15 | Disposition: A | Discharge status: Home / Self Care | Payer: 59 | Attending: Surgery | Admitting: Surgery

## 2020-12-15 ENCOUNTER — Encounter (HOSPITAL_BASED_OUTPATIENT_CLINIC_OR_DEPARTMENT_OTHER): Payer: Self-pay | Admitting: Surgery

## 2020-12-15 ENCOUNTER — Telehealth: Payer: Self-pay | Admitting: *Deleted

## 2020-12-15 DIAGNOSIS — Z833 Family history of diabetes mellitus: Secondary | ICD-10-CM | POA: Diagnosis not present

## 2020-12-15 DIAGNOSIS — I252 Old myocardial infarction: Secondary | ICD-10-CM | POA: Diagnosis not present

## 2020-12-15 DIAGNOSIS — Z952 Presence of prosthetic heart valve: Secondary | ICD-10-CM | POA: Insufficient documentation

## 2020-12-15 DIAGNOSIS — J449 Chronic obstructive pulmonary disease, unspecified: Secondary | ICD-10-CM | POA: Diagnosis not present

## 2020-12-15 DIAGNOSIS — Z86010 Personal history of colonic polyps: Secondary | ICD-10-CM | POA: Insufficient documentation

## 2020-12-15 DIAGNOSIS — K620 Anal polyp: Secondary | ICD-10-CM | POA: Diagnosis present

## 2020-12-15 DIAGNOSIS — Z9049 Acquired absence of other specified parts of digestive tract: Secondary | ICD-10-CM | POA: Diagnosis not present

## 2020-12-15 DIAGNOSIS — Z87891 Personal history of nicotine dependence: Secondary | ICD-10-CM | POA: Diagnosis not present

## 2020-12-15 DIAGNOSIS — Z885 Allergy status to narcotic agent status: Secondary | ICD-10-CM | POA: Insufficient documentation

## 2020-12-15 DIAGNOSIS — I5042 Chronic combined systolic (congestive) and diastolic (congestive) heart failure: Secondary | ICD-10-CM | POA: Diagnosis not present

## 2020-12-15 DIAGNOSIS — Z888 Allergy status to other drugs, medicaments and biological substances status: Secondary | ICD-10-CM | POA: Insufficient documentation

## 2020-12-15 DIAGNOSIS — Z8616 Personal history of COVID-19: Secondary | ICD-10-CM | POA: Diagnosis not present

## 2020-12-15 DIAGNOSIS — K6282 Dysplasia of anus: Secondary | ICD-10-CM | POA: Insufficient documentation

## 2020-12-15 DIAGNOSIS — I4891 Unspecified atrial fibrillation: Secondary | ICD-10-CM | POA: Insufficient documentation

## 2020-12-15 DIAGNOSIS — Z955 Presence of coronary angioplasty implant and graft: Secondary | ICD-10-CM | POA: Insufficient documentation

## 2020-12-15 DIAGNOSIS — I272 Pulmonary hypertension, unspecified: Secondary | ICD-10-CM | POA: Diagnosis not present

## 2020-12-15 DIAGNOSIS — Z8249 Family history of ischemic heart disease and other diseases of the circulatory system: Secondary | ICD-10-CM | POA: Diagnosis not present

## 2020-12-15 DIAGNOSIS — I110 Hypertensive heart disease with heart failure: Secondary | ICD-10-CM | POA: Diagnosis not present

## 2020-12-15 DIAGNOSIS — I2510 Atherosclerotic heart disease of native coronary artery without angina pectoris: Secondary | ICD-10-CM | POA: Insufficient documentation

## 2020-12-15 DIAGNOSIS — Z7901 Long term (current) use of anticoagulants: Secondary | ICD-10-CM | POA: Insufficient documentation

## 2020-12-15 HISTORY — PX: RECTAL EXAM UNDER ANESTHESIA: SHX6399

## 2020-12-15 HISTORY — DX: Personal history of urinary calculi: Z87.442

## 2020-12-15 HISTORY — DX: Malignant (primary) neoplasm, unspecified: C80.1

## 2020-12-15 HISTORY — DX: Essential (primary) hypertension: I10

## 2020-12-15 HISTORY — DX: Presence of dental prosthetic device (complete) (partial): Z97.2

## 2020-12-15 HISTORY — DX: Pneumonia, unspecified organism: J18.9

## 2020-12-15 LAB — POCT I-STAT, CHEM 8
BUN: 25 mg/dL — ABNORMAL HIGH (ref 8–23)
Calcium, Ion: 1.05 mmol/L — ABNORMAL LOW (ref 1.15–1.40)
Chloride: 105 mmol/L (ref 98–111)
Creatinine, Ser: 0.9 mg/dL (ref 0.61–1.24)
Glucose, Bld: 106 mg/dL — ABNORMAL HIGH (ref 70–99)
HCT: 37 % — ABNORMAL LOW (ref 39.0–52.0)
Hemoglobin: 12.6 g/dL — ABNORMAL LOW (ref 13.0–17.0)
Potassium: 4.5 mmol/L (ref 3.5–5.1)
Sodium: 139 mmol/L (ref 135–145)
TCO2: 25 mmol/L (ref 22–32)

## 2020-12-15 SURGERY — EXAM UNDER ANESTHESIA, RECTUM
Anesthesia: General | Site: Rectum

## 2020-12-15 MED ORDER — TRAMADOL HCL 50 MG PO TABS: 50.0000 mg | ORAL_TABLET | Freq: Four times a day (QID) | ORAL | 0 refills | Status: AC | PRN

## 2020-12-15 MED ORDER — LIDOCAINE HCL (PF) 2 % IJ SOLN
INTRAMUSCULAR | Status: AC
  Filled 2020-12-15: qty 5

## 2020-12-15 MED ORDER — ONDANSETRON HCL 4 MG/2ML IJ SOLN
INTRAMUSCULAR | Status: DC | PRN
  Administered 2020-12-15: 4 mg via INTRAVENOUS

## 2020-12-15 MED ORDER — CHLORHEXIDINE GLUCONATE CLOTH 2 % EX PADS: 6.0000 | MEDICATED_PAD | Freq: Once | CUTANEOUS | Status: DC

## 2020-12-15 MED ORDER — LIDOCAINE 2% (20 MG/ML) 5 ML SYRINGE
INTRAMUSCULAR | Status: DC | PRN
  Administered 2020-12-15: 60 mg via INTRAVENOUS

## 2020-12-15 MED ORDER — PROPOFOL 10 MG/ML IV BOLUS
INTRAVENOUS | Status: DC | PRN
  Administered 2020-12-15: 150 mg via INTRAVENOUS

## 2020-12-15 MED ORDER — ROCURONIUM BROMIDE 10 MG/ML (PF) SYRINGE
PREFILLED_SYRINGE | INTRAVENOUS | Status: AC
  Filled 2020-12-15: qty 10

## 2020-12-15 MED ORDER — SODIUM CHLORIDE 0.9 % IV SOLN
2.0000 g | INTRAVENOUS | Status: AC
  Administered 2020-12-15: 2 g via INTRAVENOUS

## 2020-12-15 MED ORDER — FENTANYL CITRATE (PF) 100 MCG/2ML IJ SOLN
INTRAMUSCULAR | Status: DC | PRN
  Administered 2020-12-15 (×2): 50 ug via INTRAVENOUS

## 2020-12-15 MED ORDER — DIBUCAINE (PERIANAL) 1 % EX OINT
TOPICAL_OINTMENT | CUTANEOUS | Status: DC | PRN
  Administered 2020-12-15: 1 via RECTAL

## 2020-12-15 MED ORDER — DEXAMETHASONE SODIUM PHOSPHATE 10 MG/ML IJ SOLN
INTRAMUSCULAR | Status: DC | PRN
  Administered 2020-12-15: 5 mg via INTRAVENOUS

## 2020-12-15 MED ORDER — ROCURONIUM BROMIDE 100 MG/10ML IV SOLN
INTRAVENOUS | Status: DC | PRN
  Administered 2020-12-15: 10 mg via INTRAVENOUS
  Administered 2020-12-15: 50 mg via INTRAVENOUS

## 2020-12-15 MED ORDER — BUPIVACAINE-EPINEPHRINE 0.25% -1:200000 IJ SOLN
INTRAMUSCULAR | Status: DC | PRN
  Administered 2020-12-15: 30 mL

## 2020-12-15 MED ORDER — MIDAZOLAM HCL 2 MG/2ML IJ SOLN
INTRAMUSCULAR | Status: AC
  Filled 2020-12-15: qty 2

## 2020-12-15 MED ORDER — PROPOFOL 10 MG/ML IV BOLUS
INTRAVENOUS | Status: AC
  Filled 2020-12-15: qty 20

## 2020-12-15 MED ORDER — BUPIVACAINE LIPOSOME 1.3 % IJ SUSP
INTRAMUSCULAR | Status: DC | PRN
  Administered 2020-12-15: 20 mL

## 2020-12-15 MED ORDER — ACETAMINOPHEN 500 MG PO TABS
1000.0000 mg | ORAL_TABLET | ORAL | Status: AC
  Administered 2020-12-15: 1000 mg via ORAL

## 2020-12-15 MED ORDER — ACETAMINOPHEN 500 MG PO TABS
ORAL_TABLET | ORAL | Status: AC
  Filled 2020-12-15: qty 2

## 2020-12-15 MED ORDER — FENTANYL CITRATE (PF) 250 MCG/5ML IJ SOLN
INTRAMUSCULAR | Status: AC
  Filled 2020-12-15: qty 5

## 2020-12-15 MED ORDER — CEFOXITIN SODIUM 2 G IV SOLR
INTRAVENOUS | Status: AC
  Filled 2020-12-15: qty 2

## 2020-12-15 MED ORDER — BUPIVACAINE LIPOSOME 1.3 % IJ SUSP: 20.0000 mL | Freq: Once | INTRAMUSCULAR | Status: DC

## 2020-12-15 MED ORDER — SODIUM CHLORIDE 0.9 % IV SOLN
INTRAVENOUS | Status: AC
  Filled 2020-12-15: qty 100

## 2020-12-15 MED ORDER — FENTANYL CITRATE (PF) 100 MCG/2ML IJ SOLN: 25.0000 ug | INTRAMUSCULAR | Status: DC | PRN

## 2020-12-15 MED ORDER — LACTATED RINGERS IV SOLN: INTRAVENOUS | Status: DC

## 2020-12-15 MED ORDER — SUGAMMADEX SODIUM 200 MG/2ML IV SOLN
INTRAVENOUS | Status: DC | PRN
  Administered 2020-12-15: 400 mg via INTRAVENOUS

## 2020-12-15 MED ORDER — MIDAZOLAM HCL 5 MG/5ML IJ SOLN
INTRAMUSCULAR | Status: DC | PRN
  Administered 2020-12-15: 2 mg via INTRAVENOUS

## 2020-12-15 SURGICAL SUPPLY — 64 items
APL SKNCLS STERI-STRIP NONHPOA (GAUZE/BANDAGES/DRESSINGS) ×1
BENZOIN TINCTURE PRP APPL 2/3 (GAUZE/BANDAGES/DRESSINGS) ×2 IMPLANT
BLADE EXTENDED COATED 6.5IN (ELECTRODE) IMPLANT
BLADE HEX COATED 2.75 (ELECTRODE) IMPLANT
BLADE SURG 10 STRL SS (BLADE) IMPLANT
BLADE SURG 15 STRL LF DISP TIS (BLADE) ×1 IMPLANT
BLADE SURG 15 STRL SS (BLADE) ×2
BRIEF STRETCH FOR OB PAD LRG (UNDERPADS AND DIAPERS) ×2 IMPLANT
CANISTER SUCT 3000ML PPV (MISCELLANEOUS) ×2 IMPLANT
COVER BACK TABLE 60X90IN (DRAPES) ×2 IMPLANT
COVER MAYO STAND STRL (DRAPES) ×2 IMPLANT
COVER WAND RF STERILE (DRAPES) ×2 IMPLANT
DECANTER SPIKE VIAL GLASS SM (MISCELLANEOUS) ×2 IMPLANT
DRAPE HYSTEROSCOPY (MISCELLANEOUS) IMPLANT
DRAPE LAPAROTOMY 100X72 PEDS (DRAPES) ×2 IMPLANT
DRAPE SHEET LG 3/4 BI-LAMINATE (DRAPES) IMPLANT
DRAPE UTILITY XL STRL (DRAPES) ×2 IMPLANT
DRSG PAD ABDOMINAL 8X10 ST (GAUZE/BANDAGES/DRESSINGS) ×2 IMPLANT
GAUZE SPONGE 4X4 12PLY STRL (GAUZE/BANDAGES/DRESSINGS) ×2 IMPLANT
GLOVE BIO SURGEON STRL SZ7.5 (GLOVE) ×2 IMPLANT
GLOVE INDICATOR 8.0 STRL GRN (GLOVE) ×2 IMPLANT
GOWN STRL REUS W/ TWL XL LVL3 (GOWN DISPOSABLE) ×1 IMPLANT
GOWN STRL REUS W/TWL XL LVL3 (GOWN DISPOSABLE) ×2
HYDROGEN PEROXIDE 16OZ (MISCELLANEOUS) IMPLANT
IV CATH 14GX2 1/4 (CATHETERS) IMPLANT
IV CATH 18G SAFETY (IV SOLUTION) IMPLANT
KIT SIGMOIDOSCOPE (SET/KITS/TRAYS/PACK) IMPLANT
KIT TURNOVER CYSTO (KITS) ×2 IMPLANT
LEGGING LITHOTOMY PAIR STRL (DRAPES) IMPLANT
LOOP VESSEL MAXI BLUE (MISCELLANEOUS) IMPLANT
NDL SAFETY ECLIPSE 18X1.5 (NEEDLE) IMPLANT
NEEDLE HYPO 18GX1.5 SHARP (NEEDLE)
NEEDLE HYPO 22GX1.5 SAFETY (NEEDLE) ×2 IMPLANT
NEEDLE HYPO 25X1 1.5 SAFETY (NEEDLE) IMPLANT
NS IRRIG 500ML POUR BTL (IV SOLUTION) ×2 IMPLANT
PACK BASIN DAY SURGERY FS (CUSTOM PROCEDURE TRAY) ×2 IMPLANT
PENCIL SMOKE EVACUATOR (MISCELLANEOUS) ×2 IMPLANT
SPONGE HEMORRHOID 8X3CM (HEMOSTASIS) IMPLANT
SPONGE SURGIFOAM ABS GEL 12-7 (HEMOSTASIS) IMPLANT
SUCTION FRAZIER HANDLE 10FR (MISCELLANEOUS)
SUCTION TUBE FRAZIER 10FR DISP (MISCELLANEOUS) IMPLANT
SUT CHROMIC 2 0 SH (SUTURE) IMPLANT
SUT CHROMIC 3 0 SH 27 (SUTURE) IMPLANT
SUT MNCRL AB 4-0 PS2 18 (SUTURE) IMPLANT
SUT SILK 0 PSL NDL (SUTURE) IMPLANT
SUT SILK 0 TIES 10X30 (SUTURE) IMPLANT
SUT SILK 2 0 (SUTURE)
SUT SILK 2-0 18XBRD TIE 12 (SUTURE) IMPLANT
SUT VIC AB 2-0 SH 27 (SUTURE) ×4
SUT VIC AB 2-0 SH 27XBRD (SUTURE) ×2 IMPLANT
SUT VIC AB 3-0 SH 18 (SUTURE) IMPLANT
SUT VIC AB 3-0 SH 27 (SUTURE)
SUT VIC AB 3-0 SH 27X BRD (SUTURE) IMPLANT
SUT VIC AB 3-0 SH 27XBRD (SUTURE) IMPLANT
SUT VIC AB 4-0 P-3 18XBRD (SUTURE) IMPLANT
SUT VIC AB 4-0 P3 18 (SUTURE)
SYR 20ML LL LF (SYRINGE) IMPLANT
SYR BULB IRRIG 60ML STRL (SYRINGE) IMPLANT
SYR CONTROL 10ML LL (SYRINGE) ×2 IMPLANT
SYR TB 1ML LL NO SAFETY (SYRINGE) IMPLANT
TOWEL OR 17X26 10 PK STRL BLUE (TOWEL DISPOSABLE) ×2 IMPLANT
TRAY DSU PREP LF (CUSTOM PROCEDURE TRAY) ×2 IMPLANT
TUBE CONNECTING 12X1/4 (SUCTIONS) ×2 IMPLANT
YANKAUER SUCT BULB TIP NO VENT (SUCTIONS) ×2 IMPLANT

## 2020-12-15 NOTE — Op Note (Signed)
12/15/2020  9:40 AM  PATIENT:  Jeffrey Larson  65 y.o. male  Patient Care Team: [name], DO as PCP - General (Osteopathic Medicine) Harl Bowie Alphonse Guild, MD as PCP - Cardiology (Cardiology) [name], MD as Consulting Physician (Cardiology)  PRE-OPERATIVE DIAGNOSIS:  1. Anal intraepithelia neoplasia 2. Anal polypoid lesion  POST-OPERATIVE DIAGNOSIS:  Same  PROCEDURE:   1. Excision of anal canal polypoid lesion 2. Anorectal exam under anesthesia  SURGEON:  Surgeon(s): Ileana Roup, MD   ANESTHESIA:   local and general  SPECIMEN:  Anorectal polypoid lesion - short stitch proximal; long suture posterior  DISPOSITION OF SPECIMEN:  PATHOLOGY  COUNTS:  Sponge, needle, and instrument counts were reported correct x2 at conclusion.  EBL: 1 mL  Drains: None  PLAN OF CARE: Discharge to home after PACU  PATIENT DISPOSITION:  PACU - hemodynamically stable.  OR FINDINGS: Right posterior nodular lesion just at and above the dentate. This was excised and oriented for pathology.  DESCRIPTION: The patient was identified in the preoperative holding area and taken to the OR. SCDs were applied. He then underwent general endotracheal anesthesia without difficulty. The patient was then rolled onto the OR table in the prone jackknife position. Pressure points were then evaluated and padded. Benzoin was applied to the buttocks and they were gently taped apart.  He was then prepped and draped in usual sterile fashion.  A surgical timeout was performed indicating the correct patient, procedure, and positioning. Given his valve history, antibiotics were additionally administered and confirmed to be on board.  A perianal block was then created using a dilute mixture of 0.25% Marcaine with epinephrine and Exparel.  After ascertaining an appropriate level of anesthesia had been achieved, a well lubricated digital rectal exam was performed. This demonstrated no palpable masses.   A Hill-Ferguson anoscope was into the anal canal and circumferential inspection demonstrated a 2 x 1 cm nodular type lesion in the right posterior anal canal. There were no other anal canal abnormalities identified on thorough circumferential inspection of the anal  canal.  The right posterior lesion was grasped and elevated with a DeBakey forcep.  Areas of excision were marked.  This was then incised with electrocautery.  This lesion was fully excised after confirming that the internal sphincter muscle had been teased away from this.  This is then oriented for pathology with a short stitch on the proximal border of the specimen and a long suture on the posterior border.  This was submitted for pathology.  The anal canal was irrigated and hemostasis was verified.  There was no oozing or active bleeding anywhere.  The site of excision was then closed with a running locking 2-0 Vicryl suture, transitioning to a traditional running suture at the level of the dentate line.  Additional local anesthetic was infiltrated around the area of the excision.  The anal canal was again irrigated and hemostasis was reverified.  Sponge, needle, and instrument counts were reported correct x2.  The buttock tape was released.  Dibucaine ointment was applied.  A dressing consisting of 4 x 4's, ABD, and mesh underwear was placed.  He was then taken out of the prone position, rolled onto a stretcher, awakened from anesthesia, extubated, and transported to PACU in satisfactory condition  DISPOSITION: PACU in satisfactory condition.

## 2020-12-15 NOTE — Transfer of Care (Signed)
Immediate Anesthesia Transfer of Care Note  Patient: Jeffrey Larson  Procedure(s) Performed: ANORECTAL EXAM UNDER ANESTHESIA EXCISION OF ANAL CANAL POLYP (N/A Rectum)  Patient Location: PACU  Anesthesia Type:General  Level of Consciousness: awake, alert  and oriented  Airway & Oxygen Therapy: Patient Spontanous Breathing and Patient connected to face mask oxygen  Post-op Assessment: Report given to RN and Post -op Vital signs reviewed and stable  Post vital signs: Reviewed and stable  Last Vitals:  Vitals Value Taken Time  BP 125/74 12/15/20 0930  Temp 36.4 C 12/15/20 0933  Pulse 69 12/15/20 0940  Resp 17 12/15/20 0940  SpO2 100 % 12/15/20 0940  Vitals shown include unvalidated device data.  Last Pain:  Vitals:   12/15/20 0714  TempSrc: Oral  PainSc: 0-No pain      Patients Stated Pain Goal: 3 (02/72/53 6644)  Complications: No complications documented.

## 2020-12-15 NOTE — Discharge Instructions (Signed)
ANORECTAL SURGERY: POST OP INSTRUCTIONS  1. DIET: Follow a light bland diet the first 24 hours after arrival home, such as soup, liquids, crackers, etc.  Be sure to include lots of fluids daily.  Avoid fast food or heavy meals as your are more likely to get nauseated.  Eat a low fat diet the next few days after surgery.   2. Some bleeding with bowel movements is expected for the first couple of days but this should stop in between bowel movements  3. Take your usually prescribed home medications unless otherwise directed.  4. PAIN CONTROL: a. It is helpful to take an over-the-counter pain medication regularly for the first few days/weeks.  Choose from the following that works best for you: i. Acetaminophen (Tylenol, etc) 500-650mg  every 6 hours as needed b. A  prescription for pain medication may have been prescribed for you at discharge.  Take your pain medication as prescribed.  i. If you are having problems/concerns with the prescription medicine, please call us for further advice.  5. Avoid getting constipated.  Between the surgery and the pain medications, it is common to experience some constipation.  Increasing fluid intake (64oz of water per day) and taking a fiber supplement (such as Metamucil, Citrucel, FiberCon) 1-2 times a day regularly will usually help prevent this problem from occurring.  Take Miralax (over the counter) 1-2x/day while taking a narcotic pain medication. If no bowel movement after 48hours, you may additionally take a laxative like a bottle of Milk of Magnesia which can be purchased over the counter. Avoid enemas if possible as these are often painful.  6. Lovenox/warfarin: You may resume the lovenox injections tomorrow (or on the day your heart doctor had recommended in the schedule they had provided you) assuming no issues with bleeding. Continue the lovenox injections for 4-5 days before resuming the warfarin to ensure no issues with bleeding while on a blood thinner.  You will need to be on a bridge as per cardiology while getting back on the lovenox. I have messaged them today to request they adjust the schedule they provided you to reflect the fact that the warfarin will not be resumed until 12/20/20 if possible.   7. Watch out for diarrhea.  If you have many loose bowel movements, simplify your diet to bland foods.  Stop any stool softeners and decrease your fiber supplement. If this worsens or does not improve, please call us.  8. Wash / shower every day.  If you were discharged with a dressing, you may remove this the day after your surgery. You may shower normally, getting soap/water on your wound, particularly after bowel movements.  9. Soaking in a warm bath filled a couple inches ("Sitz bath") is a great way to clean the area after a bowel movement and many patients find it is a way to soothe the area.  10. ACTIVITIES as tolerated:   a. You may resume regular (light) daily activities beginning the next day--such as daily self-care, walking, climbing stairs--gradually increasing activities as tolerated.  If you can walk 30 minutes without difficulty, it is safe to try more intense activity such as jogging, treadmill, bicycling, low-impact aerobics, etc. b. Refrain from any heavy lifting or straining for the first 2 weeks after your procedure, particularly if your surgery was for hemorrhoids. c. Avoid activities that make your pain worse d. You may drive when you are no longer taking prescription pain medication, you can comfortably wear a seatbelt, and you can safely maneuver  your car and apply brakes.  11. FOLLOW UP in our office a. Please call [facility] at [phone] to set up an appointment to see your surgeon in the office for a follow-up appointment approximately 2 weeks after your surgery. b. Make sure that you call for this appointment the day you arrive home to insure a convenient appointment time.  9. If you have disability or family leave forms  that need to be completed, you may have them completed by your primary care physician's office; for return to work instructions, please ask our office staff and they will be happy to assist you in obtaining this documentation   When to call us [phone]: 1. Poor pain control 2. Reactions / problems with new medications (rash/itching, etc)  3. Fever over 101.5 F (38.5 C) 4. Inability to urinate 5. Nausea/vomiting 6. Worsening swelling or bruising 7. Continued bleeding from incision. 8. Increased pain, redness, or drainage from the incision  The clinic staff is available to answer your questions during regular business hours (8:30am-5pm).  Please don't hesitate to call and ask to speak to one of our nurses for clinical concerns.   A surgeon from [facility] is always on call at the hospitals   If you have a medical emergency, go to the nearest emergency room or call 911.   [facility], [place], [place], [place], [place]  [address] ? MAIN: [phone] FAX [phone] [email or web address]     Post Anesthesia Home Care Instructions  Activity: Get plenty of rest for the remainder of the day. A responsible adult should stay with you for 24 hours following the procedure.  For the next 24 hours, DO NOT: -Drive a car -Paediatric nurse -Drink alcoholic beverages -Take any medication unless instructed by your physician -Make any legal decisions or sign important papers.  Meals: Start with liquid foods such as gelatin or soup. Progress to regular foods as tolerated. Avoid greasy, spicy, heavy foods. If nausea and/or vomiting occur, drink only clear liquids until the nausea and/or vomiting subsides. Call your physician if vomiting continues.  Special Instructions/Symptoms: Your throat may feel dry or sore from the anesthesia or the breathing tube placed in your throat during surgery. If this causes discomfort, gargle with warm  salt water. The discomfort should disappear within 24 hours.  If you had a scopolamine patch placed behind your ear for the management of post- operative nausea and/or vomiting:  1. The medication in the patch is effective for 72 hours, after which it should be removed.  Wrap patch in a tissue and discard in the trash. Wash hands thoroughly with soap and water. 2. You may remove the patch earlier than 72 hours if you experience unpleasant side effects which may include dry mouth, dizziness or visual disturbances. 3. Avoid touching the patch. Wash your hands with soap and water after contact with the patch.   Information for Discharge Teaching: EXPAREL (bupivacaine liposome injectable suspension)   Your surgeon gave you EXPAREL(bupivacaine) in your surgical incision to help control your pain after surgery.   EXPAREL is a local anesthetic that provides pain relief by numbing the tissue around the surgical site.  EXPAREL is designed to release pain medication over time and can control pain for up to 72 hours.  Depending on how you respond to EXPAREL, you may require less pain medication during your recovery.  Possible side effects:  Temporary loss of sensation or ability to move in the area  where bupivacaine was injected.  Nausea, vomiting, constipation  Rarely, numbness and tingling in your mouth or lips, lightheadedness, or anxiety may occur.  Call your doctor right away if you think you may be experiencing any of these sensations, or if you have other questions regarding possible side effects.  Follow all other discharge instructions given to you by your surgeon or nurse. Eat a healthy diet and drink plenty of water or other fluids.  If you return to the hospital for any reason within 96 hours following the administration of EXPAREL, please inform your health care providers.

## 2020-12-15 NOTE — H&P (Signed)
CC: Referred by Dr. Bryan Lemma for AIN found incidentally on diagnostic colonoscopy - here today for surgery  HPI: Mr. Bellin is a very pleasant [phone] with hx of HTN, HLD, afib + mechanical mitral valve (on warfarin), whom has been undergoing Cologuard tests. He reports previously as being negative. This year, it returned positive. He underwent diagnostic colonoscopy Dr. Bryan Lemma which was the first colonoscopy has ever had. He is found to have 17 polyps that were removed. These all returned as tubular adenomas without high-grade dysplasia or malignancy. He was found to have what appeared to be an anal papilla that was biopsied and returned with LGSIL. He is subsequently referred to our office for evaluation. He denies any complaints today. He denies any anorectal procedures or surgeries. He denies any known history of anorectal lesions, lumps or bumps. He does believe he had hemorrhoidal issue many years ago but has had no problems since. He denies any blood per rectum. He denies any anal pain.  INTERVAL HX He denies any changes in his health or health history. He has discontinued his blood thinner as per our instructions. He denies any complaints today and states he is ready for surgery.  PMH: HTN, HLD, afib + mechanical mitral valve (on warfarin)  PSH: He denies any anorectal procedures or surgeries.   FHx: Denies FHx of colorectal, breast, endometrial, ovarian or cervical cancer  Social: Denies use of tobacco/EtOH/drugs. He is self-employed  ROS: A comprehensive 10 system review of systems was completed with the patient and pertinent findings as noted above.  Past Medical History:  Diagnosis Date  . Atrial fibrillation (HCC)    hx of  . Cancer ([place])    skin cancer removed from arm dec 2021 melanoma  . Chronic combined systolic and diastolic congestive heart failure ([place])   . COPD with chronic bronchitis ([place])   . Coronary artery disease   . COVID-19 virus infection  06/2020   all symptoms resolved  . History of kidney stones   . Hyperlipidemia   . Hypertension   . Longstanding persistent atrial fibrillation ([place])   . Mitral stenosis with regurgitation   . Myocardial infarction ([place]) 2015   stents x 2  . Obesity   . Pneumonia 2015 and 06-2020  . Pulmonary hypertension ([place])   . S/P Maze operation for atrial fibrillation 07/18/2018   Complete bilateral atrial lesion set using bipolar radiofrequency and cryothermy ablation with clipping of LA appendage  . S/P mitral valve replacement with metallic valve 01/05/4708   Sorin Carbomedics Optiform bileaflet mechanical valve, size 33 mm  . S/P TVR (tricuspid valve repair) 07/18/2018   Edwards mc3 ring annuloplasty, size 30 mm  . Wears partial dentures    upper and lower    Past Surgical History:  Procedure Laterality Date  . APPENDECTOMY     as a child  . CARDIOVASCULAR STRESS TEST  03/2014   Borderline reversible ischemic changes at the apex.  Normal LV contractility/EF 52%.  . CORONARY STENT PLACEMENT  7/12013  . LEFT HEART CATHETERIZATION WITH CORONARY ANGIOGRAM N/A 06/12/2012   Procedure: LEFT HEART CATHETERIZATION WITH CORONARY ANGIOGRAM;  Surgeon: Clent Demark, MD;  Location: [facility] CATH LAB;  Service: Cardiovascular;  Laterality: N/A;  . MAZE N/A 07/18/2018   Procedure: MAZE;  Surgeon: Rexene Alberts, MD;  Location: [place];  Service: Open Heart Surgery;  Laterality: N/A;  . MITRAL VALVE REPLACEMENT N/A 07/18/2018   Procedure: MITRAL VALVE (MV) REPLACEMENT WITH CARBOMEDICS OPTIFORM MITRAL VALVE SIZE  33.;  Surgeon: Rexene Alberts, MD;  Location: [place];  Service: Open Heart Surgery;  Laterality: N/A;  . PERCUTANEOUS CORONARY STENT INTERVENTION (PCI-S) N/A 06/17/2012   Procedure: PERCUTANEOUS CORONARY STENT INTERVENTION (PCI-S);  Surgeon: Clent Demark, MD;  Location: [facility] CATH LAB;  Service: Cardiovascular;  Laterality: N/A;  . RIGHT/LEFT HEART CATH AND CORONARY ANGIOGRAPHY N/A 06/10/2018   Procedure:  RIGHT/LEFT HEART CATH AND CORONARY ANGIOGRAPHY;  Surgeon: Dixie Dials, MD;  Location: [facility] CV LAB;  Service: Cardiovascular;  Laterality: N/A;  . TEE WITHOUT CARDIOVERSION N/A 06/10/2018   Procedure: TRANSESOPHAGEAL ECHOCARDIOGRAM (TEE) with bubble study;  Surgeon: Dixie Dials, MD;  Location: [facility] ENDOSCOPY;  Service: Cardiovascular;  Laterality: N/A;  . TEE WITHOUT CARDIOVERSION N/A 07/18/2018   Procedure: TRANSESOPHAGEAL ECHOCARDIOGRAM (TEE);  Surgeon: Rexene Alberts, MD;  Location: [place];  Service: Open Heart Surgery;  Laterality: N/A;  . TRICUSPID VALVE REPLACEMENT N/A 07/18/2018   Procedure: TRICUSPID VALVE REPAIR WITH EDWARDS MC3 TRICUSPID ANNULOPLASTY RING SIZE 30.;  Surgeon: Rexene Alberts, MD;  Location: [place];  Service: Open Heart Surgery;  Laterality: N/A;    Family History  Problem Relation Age of Onset  . Heart disease Mother   . Heart disease Father   . Diabetes Father   . Diabetes Brother   . Colon cancer Neg Hx   . Esophageal cancer Neg Hx   . Stomach cancer Neg Hx     Social:  reports that he quit smoking about 24 years ago. His smoking use included cigarettes. He has a 30.00 pack-year smoking history. He has never used smokeless tobacco. He reports that he does not drink alcohol and does not use drugs.  Allergies:  Allergies  Allergen Reactions  . Ramipril Cough    cough  . Percocet [Oxycodone-Acetaminophen]     In ICU it contributed to hallucinations. Can take by itself    Medications: I have reviewed the patient's current medications.  Results for orders placed or performed during the hospital encounter of 12/15/20 (from the past 48 hour(s))  I-STAT, chem 8     Status: Abnormal   Collection Time: 12/15/20  8:00 AM  Result Value Ref Range   Sodium 139 135 - 145 mmol/L   Potassium 4.5 3.5 - 5.1 mmol/L   Chloride 105 98 - 111 mmol/L   BUN 25 (H) 8 - 23 mg/dL   Creatinine, Ser 0.90 0.61 - 1.24 mg/dL   Glucose, Bld 106 (H) 70 - 99 mg/dL    Comment:  Glucose reference range applies only to samples taken after fasting for at least 8 hours.   Calcium, Ion 1.05 (L) 1.15 - 1.40 mmol/L   TCO2 25 22 - 32 mmol/L   Hemoglobin 12.6 (L) 13.0 - 17.0 g/dL   HCT 37.0 (L) 39.0 - 52.0 %    No results found.  ROS - all of the below systems have been reviewed with the patient and positives are indicated with bold text General: chills, fever or night sweats Eyes: blurry vision or double vision ENT: epistaxis or sore throat Allergy/Immunology: itchy/watery eyes or nasal congestion Hematologic/Lymphatic: bleeding problems, blood clots or swollen lymph nodes Endocrine: temperature intolerance or unexpected weight changes Breast: new or changing breast lumps or nipple discharge Resp: cough, shortness of breath, or wheezing CV: chest pain or dyspnea on exertion GI: as per HPI GU: dysuria, trouble voiding, or hematuria MSK: joint pain or joint stiffness Neuro: TIA or stroke symptoms Derm: pruritus and skin lesion changes Psych:  anxiety and depression  PE Blood pressure 137/71, pulse 68, temperature (!) 97.1 F (36.2 C), temperature source Oral, resp. rate 18, height 5\' 11"  (1.803 m), weight 120.5 kg, SpO2 100 %. Constitutional: NAD; conversant; no deformities; wearing mask Eyes: Moist conjunctiva; no lid lag; anicteric Lungs: Normal respiratory effort; no tactile fremitus CV: no pitting edema MSK: Normal range of motion of extremities Psychiatric: Appropriate affect; alert and oriented x3  Results for orders placed or performed during the hospital encounter of 12/15/20 (from the past 48 hour(s))  I-STAT, chem 8     Status: Abnormal   Collection Time: 12/15/20  8:00 AM  Result Value Ref Range   Sodium 139 135 - 145 mmol/L   Potassium 4.5 3.5 - 5.1 mmol/L   Chloride 105 98 - 111 mmol/L   BUN 25 (H) 8 - 23 mg/dL   Creatinine, Ser 0.90 0.61 - 1.24 mg/dL   Glucose, Bld 106 (H) 70 - 99 mg/dL    Comment: Glucose reference range applies only to  samples taken after fasting for at least 8 hours.   Calcium, Ion 1.05 (L) 1.15 - 1.40 mmol/L   TCO2 25 22 - 32 mmol/L   Hemoglobin 12.6 (L) 13.0 - 17.0 g/dL   HCT 37.0 (L) 39.0 - 52.0 %    No results found.   A/P: WIILIAM PLAMBECK is an 65 y.o. male with Mr. Haberkorn is a very pleasant [phone] with hx of HTN, HLD, afib + mechanical mitral valve (on warfarin) whom was found on diagnostic colonoscopy to have 17 polyps that were removed and returned as tubular adenomas and additionally an anal canal polypoid lesion that was biopsied and returned with AIN On exam, he does have what looks like a small polypoid like lesion in the right posterolateral anal canal  -Genetics referral given overall polyp burden -Given the biopsy results and additionally our findings in the office, we discussed proceeding with anorectal examination anesthesia and removal of any anal canal polypoid lesions that we identify at that time. -The anatomy and physiology of the anal canal was discussed at length with the patient. The pathophysiology of AIN was discussed at length as well. We discussed that this is typically a marker of underlying human papilloma virus (HPV) which represents a sexually transmitted infection. We also reviewed importance of surveillance moving forward for increased lifetime risk of anal cancer. He expressed understanding -The planned procedure, material risks (including, but not limited to, pain, bleeding, infection, scarring, need for blood transfusion, incontinence of gas and/or stool, need for additional procedures, recurrence, pneumonia, valve thrombosis, heart attack, stroke, death) benefits and alternatives to surgery were discussed at length. The patient's questions were answered to his satisfaction, he voiced understanding and elected to proceed with surgery. Additionally, we discussed typical postoperative expectations and the recovery process.  Nadeen Landau, MD [facility] Use [email or web address] to contact on call provider

## 2020-12-15 NOTE — Anesthesia Procedure Notes (Signed)
Procedure Name: Intubation Date/Time: 12/15/2020 8:43 AM Performed by: Gwyndolyn Saxon, CRNA Pre-anesthesia Checklist: Patient identified, Emergency Drugs available, Suction available and Patient being monitored Patient Re-evaluated:Patient Re-evaluated prior to induction Oxygen Delivery Method: Circle system utilized Preoxygenation: Pre-oxygenation with 100% oxygen Induction Type: IV induction Ventilation: Mask ventilation without difficulty Laryngoscope Size: Glidescope and 4 Grade View: Grade I Tube type: Oral Tube size: 8.0 mm Number of attempts: 1 Airway Equipment and Method: Patient positioned with wedge pillow and Rigid stylet Placement Confirmation: ETT inserted through vocal cords under direct vision,  positive ETCO2 and breath sounds checked- equal and bilateral Secured at: 23 cm Tube secured with: Tape Dental Injury: Teeth and Oropharynx as per pre-operative assessment  Comments: All dentition same as before airway placement; soft gauze bite block placed prior to going prone; MDA aware

## 2020-12-15 NOTE — Anesthesia Preprocedure Evaluation (Addendum)
Anesthesia Evaluation  Patient identified by MRN, date of birth, ID band Patient awake    Reviewed: Allergy & Precautions, H&P , NPO status , Patient's Chart, lab work & pertinent test results, reviewed documented beta blocker date and time   Airway Mallampati: II  TM Distance: >3 FB Neck ROM: Full    Dental no notable dental hx. (+) Partial Lower, Partial Upper, Dental Advisory Given   Pulmonary COPD, former smoker,    Pulmonary exam normal breath sounds clear to auscultation       Cardiovascular hypertension, Pt. on medications and Pt. on home beta blockers + CAD, + Past MI and +CHF   Rhythm:Regular Rate:Normal     Neuro/Psych negative neurological ROS  negative psych ROS   GI/Hepatic negative GI ROS, Neg liver ROS,   Endo/Other  diabetes, Type 2, Oral Hypoglycemic AgentsMorbid obesity  Renal/GU negative Renal ROS  negative genitourinary   Musculoskeletal   Abdominal   Peds  Hematology negative hematology ROS (+)   Anesthesia Other Findings   Reproductive/Obstetrics negative OB ROS                            Anesthesia Physical Anesthesia Plan  ASA: III  Anesthesia Plan: General   Post-op Pain Management:    Induction: Intravenous  PONV Risk Score and Plan: 3 and Ondansetron, Propofol infusion and Midazolam  Airway Management Planned: Oral ETT  Additional Equipment:   Intra-op Plan:   Post-operative Plan: Extubation in OR  Informed Consent: I have reviewed the patients History and Physical, chart, labs and discussed the procedure including the risks, benefits and alternatives for the proposed anesthesia with the patient or authorized representative who has indicated his/her understanding and acceptance.     Dental advisory given  Plan Discussed with: CRNA  Anesthesia Plan Comments:        Anesthesia Quick Evaluation

## 2020-12-15 NOTE — Anesthesia Postprocedure Evaluation (Signed)
Anesthesia Post Note  Patient: Jeffrey Larson  Procedure(s) Performed: ANORECTAL EXAM UNDER ANESTHESIA EXCISION OF ANAL CANAL POLYP (N/A Rectum)     Patient location during evaluation: PACU Anesthesia Type: General Level of consciousness: awake and alert Pain management: pain level controlled Vital Signs Assessment: post-procedure vital signs reviewed and stable Respiratory status: spontaneous breathing, nonlabored ventilation and respiratory function stable Cardiovascular status: blood pressure returned to baseline and stable Postop Assessment: no apparent nausea or vomiting Anesthetic complications: no   No complications documented.  Last Vitals:  Vitals:   12/15/20 1015 12/15/20 1018  BP:    Pulse: 63 63  Resp: 16 13  Temp:  36.5 C  SpO2: 99% 98%    Last Pain:  Vitals:   12/15/20 1018  TempSrc: Oral  PainSc: 0-No pain                 Ahmod Gillespie,W. EDMOND

## 2020-12-15 NOTE — Telephone Encounter (Signed)
Spoke with patient and wife regarding new warfarin/lovenox instructions per Dr Orest Dikes request.  Pt will restart lovenox 120mg  twice daily from 12/16/20 until f/u INR check on 12/26/20.  He will not restart warfarin until 12/20/20 (taking 6mg  daily) until INR appt on 12/26/20 at 11:30am.  Pt and wife verbalized understanding.

## 2020-12-15 NOTE — Telephone Encounter (Signed)
-----   Message from Ileana Roup, MD sent at 12/15/2020  1:57 PM EST ----- Regarding: RE: Follow-up Awesome. I appreciate your help with him and fast response!!  ----- Message ----- From: Malen Gauze, RN Sent: 12/15/2020  10:42 AM EST To: Ileana Roup, MD, Arnoldo Lenis, MD Subject: RE: Follow-up                                  I will take care of this and let the patient know.  Thanks for the heads up. Lattie Haw ----- Message ----- From: Ileana Roup, MD Sent: 12/15/2020  10:27 AM EST To: Malen Gauze, RN, Arnoldo Lenis, MD Subject: Follow-up                                      Good morning,  We just finished surgery on Mr. Lacasse. Everything went fine. We removed a lesion from the anal canal, essentially a hemorrhoidectomy for purposes of what was done. I had noticed in the instructions he was provided, he was told to restart his warfarin tonight. I am ok with him resuming the lovenox tomorrow but would prefer if possible the warfarin be delayed for the next 5 days to ensure no bleeding while on the lovenox. Would y'all be able to adjust his schedule to reflect this? I appreciate your help with him!  Gerald Stabs

## 2020-12-16 ENCOUNTER — Other Ambulatory Visit: Payer: Self-pay | Admitting: *Deleted

## 2020-12-16 ENCOUNTER — Encounter (HOSPITAL_BASED_OUTPATIENT_CLINIC_OR_DEPARTMENT_OTHER): Payer: Self-pay | Admitting: Surgery

## 2020-12-16 MED ORDER — ENOXAPARIN SODIUM 120 MG/0.8ML ~~LOC~~ SOLN: 120.0000 mg | Freq: Two times a day (BID) | SUBCUTANEOUS | 0 refills | Status: DC

## 2020-12-20 ENCOUNTER — Encounter: Payer: Self-pay | Admitting: Licensed Clinical Social Worker

## 2020-12-20 ENCOUNTER — Inpatient Hospital Stay: Payer: 59 | Attending: Genetic Counselor | Admitting: Licensed Clinical Social Worker

## 2020-12-20 DIAGNOSIS — Z803 Family history of malignant neoplasm of breast: Secondary | ICD-10-CM | POA: Diagnosis not present

## 2020-12-20 DIAGNOSIS — Z801 Family history of malignant neoplasm of trachea, bronchus and lung: Secondary | ICD-10-CM | POA: Diagnosis not present

## 2020-12-20 DIAGNOSIS — Z860100 Personal history of colon polyps, unspecified: Secondary | ICD-10-CM | POA: Insufficient documentation

## 2020-12-20 LAB — SURGICAL PATHOLOGY

## 2020-12-20 NOTE — Progress Notes (Signed)
REFERRING PROVIDER: Ileana Roup, MD [place],  [place] [address]  PRIMARY PROVIDER:  [name], DO  PRIMARY REASON FOR VISIT:  1. Family history of lung cancer   2. Personal history of colonic polyps   3. Family history of breast cancer    I connected with Mr. Brosh on 12/20/20 at 1:20 PM EDT by MyChart video conference and verified that I am speaking with the correct person using two identifiers.    Patient location: home Provider location: [place]:   Mr. Roger, a 65 y.o. male, was seen for a [place] cancer genetics consultation at the request of Dr. Dema Severin due to a personal history of polyps.  Mr. Bisceglia presents to clinic today to discuss the possibility of a hereditary predisposition to cancer, genetic testing, and to further clarify his future cancer risks, as well as potential cancer risks for family members.   Mr. Cookson is a 65 y.o. 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 He had his first colonoscopy in December 2021 that revealed 17 tubular adenomas. He also had anorectal lesion removed on 12/15/2020.  CANCER HISTORY:  Oncology History   No history exists.     Past Medical History:  Diagnosis Date  . Atrial fibrillation (HCC)    hx of  . Cancer ([place])    skin cancer removed from arm dec 2021 melanoma  . Chronic combined systolic and diastolic congestive heart failure ([place])   . COPD with chronic bronchitis ([place])   . Coronary artery disease   . COVID-19 virus infection 06/2020   all symptoms resolved  . Family history of breast cancer   . Family history of breast cancer   . Family history of lung cancer   . History of kidney stones   . Hyperlipidemia   . Hypertension   . Longstanding persistent atrial fibrillation ([place])   . Mitral stenosis with regurgitation   . Myocardial infarction ([place]) 2015   stents x 2  .  Obesity   . Personal history of colonic polyps   . Pneumonia 2015 and 06-2020  . Pulmonary hypertension ([place])   . S/P Maze operation for atrial fibrillation 07/18/2018   Complete bilateral atrial lesion set using bipolar radiofrequency and cryothermy ablation with clipping of LA appendage  . S/P mitral valve replacement with metallic valve 07/28/4096   Sorin Carbomedics Optiform bileaflet mechanical valve, size 33 mm  . S/P TVR (tricuspid valve repair) 07/18/2018   Edwards mc3 ring annuloplasty, size 30 mm  . Wears partial dentures    upper and lower    Past Surgical History:  Procedure Laterality Date  . APPENDECTOMY     as a child  . CARDIOVASCULAR STRESS TEST  03/2014   Borderline reversible ischemic changes at the apex.  Normal LV contractility/EF 52%.  . CORONARY STENT PLACEMENT  7/12013  . LEFT HEART CATHETERIZATION WITH CORONARY ANGIOGRAM N/A 06/12/2012   Procedure: LEFT HEART CATHETERIZATION WITH CORONARY ANGIOGRAM;  Surgeon: Clent Demark, MD;  Location: [facility] CATH LAB;  Service: Cardiovascular;  Laterality: N/A;  . MAZE N/A 07/18/2018   Procedure: MAZE;  Surgeon: Rexene Alberts, MD;  Location: [place];  Service: Open Heart Surgery;  Laterality: N/A;  . MITRAL VALVE REPLACEMENT N/A 07/18/2018   Procedure: MITRAL VALVE (MV) REPLACEMENT WITH CARBOMEDICS OPTIFORM MITRAL VALVE SIZE 33.;  Surgeon: Rexene Alberts, MD;  Location:  [place] OR;  Service: Open Heart Surgery;  Laterality: N/A;  . PERCUTANEOUS CORONARY STENT INTERVENTION (PCI-S) N/A 06/17/2012   Procedure: PERCUTANEOUS CORONARY STENT INTERVENTION (PCI-S);  Surgeon: Clent Demark, MD;  Location[facility] C[facility] LAB;  Service: Cardiovascular;  Laterality: N/A;  . RECTAL EXAM UNDER ANESTHESIA N/A 12/15/2020   Procedure: ANORECTAL EXAM UNDER ANESTHESIA EXCISION OF ANAL CANAL POLYP;  Surgeon: Ileana Roup, MD;  Location: [place];  Service: General;  Laterality: N/A;  . RIGHT/LEFT HEART CATH AND CORONARY ANGIOGRAPHY N/A  06/10/2018   Procedure: RIGHT/LEFT HEART CATH AND CORONARY ANGIOGRAPHY;  Surgeon: Dixie Dials, MD;  Location: [place] CV LAB;  Service: Cardiovascular;  Laterality: N/A;  . TEE WITHOUT CARDIOVERSION N/A 06/10/2018   Procedure: TRANSESOPHAGEAL ECHOCARDIOGRAM (TEE) with bubble study;  Surgeon: Dixie Dials, MD;  Location: [facility] ENDOSCOPY;  Service: Cardiovascular;  Laterality: N/A;  . TEE WITHOUT CARDIOVERSION N/A 07/18/2018   Procedure: TRANSESOPHAGEAL ECHOCARDIOGRAM (TEE);  Surgeon: Rexene Alberts, MD;  Location: [place];  Service: Open Heart Surgery;  Laterality: N/A;  . TRICUSPID VALVE REPLACEMENT N/A 07/18/2018   Procedure: TRICUSPID VALVE REPAIR WITH EDWARDS MC3 TRICUSPID ANNULOPLASTY RING SIZE 30.;  Surgeon: Rexene Alberts, MD;  Location: [place];  Service: Open Heart Surgery;  Laterality: N/A;    Social History   Socioeconomic History  . Marital status: Married    Spouse name: Not on file  . Number of children: Not on file  . Years of education: Not on file  . Highest education level: Not on file  Occupational History  . Not on file  Tobacco Use  . Smoking status: Former Smoker    Packs/day: 2.00    Years: 15.00    Pack years: 30.00    Types: Cigarettes    Quit date: 11/26/1996    Years since quitting: 24.0  . Smokeless tobacco: Never Used  Vaping Use  . Vaping Use: Never used  Substance and Sexual Activity  . Alcohol use: No  . Drug use: No  . Sexual activity: Yes  Other Topics Concern  . Not on file  Social History Narrative  . Not on file   Social Determinants of Health   Financial Resource Strain: Not on file  Food Insecurity: Not on file  Transportation Needs: Not on file  Physical Activity: Not on file  Stress: Not on file  Social Connections: Not on file     FAMILY HISTORY:  We obtained a detailed, 4-generation family history.  Significant diagnoses are listed below: Family History  Problem Relation Age of Onset  . Heart disease Mother   . Heart  disease Father   . Diabetes Father   . Diabetes Brother   . Breast cancer Maternal Aunt 1  . Cancer Paternal Aunt        unk type, tumor on head  . Lung cancer Cousin 54  . Cancer Cousin 38  . Colon cancer Neg Hx   . Esophageal cancer Neg Hx   . Stomach cancer Neg Hx    Mr. Newsham has a son and a daughter, no cancers. He had one brother and one sister, neither had cancer. His sister did have stomach issues and she passed away during a surgery for this indication.   Mr. Skow mother passed at 49, did not have cancer. Patient's maternal aunt had breast cancer at 36 and died at 71. No known cancers in maternal cousins or grandparents.  Mr. Heinecke father died at 66 due to a  heart attack. Patient had 1 maternal aunt and she had an unknown cancer on her head. She had several children, one of her sons was recently diagnosed with lung cancer. One of her daughters had a daughter that died of an unknown cancer at 75.   Mr. Wenger is unaware of previous family history of genetic testing for hereditary cancer risks. Patient's maternal ancestors are of Caucasian descent, and paternal ancestors are of [place] descent. There is no reported Ashkenazi Jewish ancestry. There is no known consanguinity.   GENETIC COUNSELING ASSESSMENT: Mr. Jowers is a 65 y.o. male with a personal history of colon polyps which is somewhat suggestive of a hereditary polyposis syndrome and predisposition to cancer. We, therefore, discussed and recommended the following at today's visit.   DISCUSSION: We discussed that polyps in general are common, however, most people have fewer than 5 lifetime polyps.  When an individual has 10 or more polyps we become concerned about an underlying polyposis syndrome. The most common hereditary polyposis syndromes are caused by problems in the APC and MUTYH genes, however, more recently, mutations in the [place] and MSH3 genes have been identified in some polyposis families. We  discussed that testing is beneficial for several reasons including knowing how to follow individuals for cancer screenings, and understand if other family members could be at risk for cancer and allow them to undergo genetic testing.   We reviewed the characteristics, features and inheritance patterns of hereditary cancer syndromes. We also discussed genetic testing, including the appropriate family members to test, the process of testing, insurance coverage and turn-around-time for results. We discussed the implications of a negative, positive and/or variant of uncertain significant result. We recommended Mr. Taff pursue genetic testing for the [facility] Multi-Cancer Panel + RNA gene panel.   The Multi-Cancer Panel offered by [facility] includes sequencing and/or deletion duplication testing of the following 84 genes: AIP, ALK, APC, ATM, AXIN2,BAP1,  BARD1, BLM, BMPR1A, BRCA1, BRCA2, BRIP1, CASR, CDC73, CDH1, CDK4, CDKN1B, CDKN1C, CDKN2A (p14ARF), CDKN2A (p16INK4a), CEBPA, CHEK2, CTNNA1, DICER1, DIS3L2, EGFR (c.2369C>T, p.Thr790Met variant only), EPCAM (Deletion/duplication testing only), FH, FLCN, GATA2, GPC3, GREM1 (Promoter region deletion/duplication testing only), HOXB13 (c.251G>A, p.Gly84Glu), HRAS, KIT, MAX, MEN1, MET, MITF (c.952G>A, p.Glu318Lys variant only), MLH1, MSH2, MSH3, MSH6, MUTYH, NBN, NF1, NF2, NTHL1, PALB2, PDGFRA, PHOX2B, PMS2, POLD1, POLE, POT1, PRKAR1A, PTCH1, PTEN, RAD50, RAD51C, RAD51D, RB1, RECQL4, RET, RUNX1, SDHAF2, SDHA (sequence changes only), SDHB, SDHC, SDHD, SMAD4, SMARCA4, SMARCB1, SMARCE1, STK11, SUFU, TERC, TERT, TMEM127, TP53, TSC1, TSC2, VHL, WRN and WT1.   Based on Mr. Olveda's personal history of polyps, he meets medical criteria for genetic testing. Despite that he meets criteria, he may still have an out of pocket cost.  PLAN: After considering the risks, benefits, and limitations, Mr. Castilleja provided informed consent to pursue genetic testing and the blood  sample was sent to [facility] for analysis of the Multi-Cancer Panel+RNA. Results should be available within approximately 2-3 weeks' time, at which point they will be disclosed by telephone to Mr. Fifer, as will any additional recommendations warranted by these results. Mr. Kaspar will receive a summary of his genetic counseling visit and a copy of his results once available. This information will also be available in Epic.   Lastly, we encouraged Mr. Jian to remain in contact with cancer genetics annually so that we can continuously update the family history and inform him of any changes in cancer genetics and testing that may be of benefit for this family.   Mr.  Farinas's questions were answered to his satisfaction today. Our contact information was provided should additional questions or concerns arise. Thank you for the referral and allowing [place] to share in the care of your patient.   Faith Rogue, MS, [facility] Genetic Counselor [email or web address] Phone: [phone]  The patient was seen for a total of 35 minutes in virtual genetic counseling. [facility] intern Fredrik Rigger was also present and assisted with this case. Dr. Grayland Ormond was available for discussion regarding this case.   _______________________________________________________________________ For Office Staff:  Number of people involved in session: 2 Was an Intern/ student involved with case: yes

## 2020-12-21 ENCOUNTER — Inpatient Hospital Stay: Payer: 59

## 2020-12-21 ENCOUNTER — Other Ambulatory Visit: Payer: Self-pay

## 2020-12-21 DIAGNOSIS — Z86010 Personal history of colonic polyps: Secondary | ICD-10-CM

## 2020-12-21 LAB — GENETIC SCREENING ORDER

## 2020-12-22 ENCOUNTER — Encounter: Payer: Self-pay | Admitting: Osteopathic Medicine

## 2020-12-22 ENCOUNTER — Ambulatory Visit (INDEPENDENT_AMBULATORY_CARE_PROVIDER_SITE_OTHER): Payer: 59 | Admitting: Osteopathic Medicine

## 2020-12-22 VITALS — BP 114/78 | HR 74 | Temp 98.0°F | Wt 268.0 lb

## 2020-12-22 DIAGNOSIS — E119 Type 2 diabetes mellitus without complications: Secondary | ICD-10-CM

## 2020-12-22 DIAGNOSIS — Z0000 Encounter for general adult medical examination without abnormal findings: Secondary | ICD-10-CM

## 2020-12-22 LAB — POCT GLYCOSYLATED HEMOGLOBIN (HGB A1C): Hemoglobin A1C: 4.9 % (ref 4.0–5.6)

## 2020-12-22 MED ORDER — SPIRONOLACTONE 25 MG PO TABS: ORAL_TABLET | ORAL | 1 refills | Status: DC

## 2020-12-22 NOTE — Progress Notes (Signed)
HPI: Jeffrey Larson is a 65 y.o. male who  has a past medical history of Atrial fibrillation ([place]), Cancer ([place]), Chronic combined systolic and diastolic congestive heart failure ([place]), COPD with chronic bronchitis ([place]), Coronary artery disease, COVID-19 virus infection (06/2020), Family history of breast cancer, Family history of breast cancer, Family history of lung cancer, History of kidney stones, Hyperlipidemia, Hypertension, Longstanding persistent atrial fibrillation ([place]), Mitral stenosis with regurgitation, Myocardial infarction ([place]) (2015), Obesity, Personal history of colonic polyps, Pneumonia (2015 and 06-2020), Pulmonary hypertension ([place]), S/P Maze operation for atrial fibrillation (07/18/2018), S/P mitral valve replacement with metallic valve (0000000), S/P TVR (tricuspid valve repair) (07/18/2018), and Wears partial dentures.  he presents to [facility] today, 12/22/20,  for chief complaint of:   Prediabetes A1C  5.1(07/21) --> 4.9 (01/22)  Has been taking ozempic 1 mg dose with no side effects. Denies any episodes of hypoglycemia.  Has not taken ozempic in about 3 weeks due to procedure to remove colon polyps. Currently on Lovenox injections post op, and plans to restart ozempic after transitioning back to coumadin in about a week.  Has noticed significant weight loss since initiating ozempic and has also noted some minor weight gain since discontinuation over last couple weeks Received notice that ozempic will no longer be covered if the dose is not increased (???). Currently on max dose of 1 mg a week at this time. Will have patient send copy of letter received to investigate further.    Lump L abdomen Pt injected Lovenox about 1 week ago in center of area where lump is. Over the next day a hard lump and bruising developed in the area. There is now a 1.5 in x 0.5 in hard mass in L flank region with surrounding yellow and purple bruising. It  has not changes in size or shape since it appeared.  Denies: nausea, vomiting, bowel changes, fever, pain with eating.    Past medical, surgical, social and family history reviewed:  Patient Active Problem List   Diagnosis Date Noted  . Family history of lung cancer   . Personal history of colonic polyps   . Family history of breast cancer   . Enthesopathy of ankle 05/31/2020  . 2019 novel coronavirus disease (COVID-19) 07/08/2019  . COVID-19 virus infection 07/08/2019  . Encounter for therapeutic drug monitoring 08/04/2018  . Pressure injury of skin 07/30/2018  . S/P mitral valve replacement with metallic valve  99991111  . S/P TVR (tricuspid valve repair) 07/18/2018  . S/P Maze operation for atrial fibrillation 07/18/2018  . Tricuspid valve insufficiency 07/15/2018  . Morbid obesity ([place])   . Pulmonary hypertension ([place])   . Coronary artery disease   . Controlled type 2 diabetes mellitus without complication, without long-term current use of insulin ([place]) 08/02/2017  . Cardiac disease 04/18/2017  . Longstanding persistent atrial fibrillation ([place])   . Acute respiratory failure with hypoxia ([place])   . Chronic combined systolic and diastolic heart failure ([place]) 11/12/2016  . Bilateral low back pain without sciatica 04/30/2016  . Hematuria, microscopic 04/30/2016  . COPD with chronic bronchitis ([place]) 09/08/2014  . History of MI (myocardial infarction) 08/04/2014  . Hyperlipidemia 08/04/2014  . Essential hypertension, benign 08/04/2014    Past Surgical History:  Procedure Laterality Date  . APPENDECTOMY     as a child  . CARDIOVASCULAR STRESS TEST  03/2014   Borderline reversible ischemic changes at the apex.  Normal LV contractility/EF 52%.  . CORONARY STENT PLACEMENT  7/12013  . LEFT HEART CATHETERIZATION WITH CORONARY ANGIOGRAM N/A 06/12/2012   Procedure: LEFT HEART CATHETERIZATION WITH CORONARY ANGIOGRAM;  Surgeon: Clent Demark, MD;  Location: [facility] CATH LAB;  Service:  Cardiovascular;  Laterality: N/A;  . MAZE N/A 07/18/2018   Procedure: MAZE;  Surgeon: Rexene Alberts, MD;  Location: [place];  Service: Open Heart Surgery;  Laterality: N/A;  . MITRAL VALVE REPLACEMENT N/A 07/18/2018   Procedure: MITRAL VALVE (MV) REPLACEMENT WITH CARBOMEDICS OPTIFORM MITRAL VALVE SIZE 33.;  Surgeon: Rexene Alberts, MD;  Location: [place];  Service: Open Heart Surgery;  Laterality: N/A;  . PERCUTANEOUS CORONARY STENT INTERVENTION (PCI-S) N/A 06/17/2012   Procedure: PERCUTANEOUS CORONARY STENT INTERVENTION (PCI-S);  Surgeon: Clent Demark, MD;  Location: [facility] CATH LAB;  Service: Cardiovascular;  Laterality: N/A;  . RECTAL EXAM UNDER ANESTHESIA N/A 12/15/2020   Procedure: ANORECTAL EXAM UNDER ANESTHESIA EXCISION OF ANAL CANAL POLYP;  Surgeon: Ileana Roup, MD;  Location: [place];  Service: General;  Laterality: N/A;  . RIGHT/LEFT HEART CATH AND CORONARY ANGIOGRAPHY N/A 06/10/2018   Procedure: RIGHT/LEFT HEART CATH AND CORONARY ANGIOGRAPHY;  Surgeon: Dixie Dials, MD;  Location: [place] CV LAB;  Service: Cardiovascular;  Laterality: N/A;  . TEE WITHOUT CARDIOVERSION N/A 06/10/2018   Procedure: TRANSESOPHAGEAL ECHOCARDIOGRAM (TEE) with bubble study;  Surgeon: Dixie Dials, MD;  Location: [facility] ENDOSCOPY;  Service: Cardiovascular;  Laterality: N/A;  . TEE WITHOUT CARDIOVERSION N/A 07/18/2018   Procedure: TRANSESOPHAGEAL ECHOCARDIOGRAM (TEE);  Surgeon: Rexene Alberts, MD;  Location: [place];  Service: Open Heart Surgery;  Laterality: N/A;  . TRICUSPID VALVE REPLACEMENT N/A 07/18/2018   Procedure: TRICUSPID VALVE REPAIR WITH EDWARDS MC3 TRICUSPID ANNULOPLASTY RING SIZE 30.;  Surgeon: Rexene Alberts, MD;  Location: [place];  Service: Open Heart Surgery;  Laterality: N/A;    Social History   Tobacco Use  . Smoking status: Former Smoker    Packs/day: 2.00    Years: 15.00    Pack years: 30.00    Types: Cigarettes    Quit date: 11/26/1996    Years since quitting:  24.0  . Smokeless tobacco: Never Used  Substance Use Topics  . Alcohol use: No    Family History  Problem Relation Age of Onset  . Heart disease Mother   . Heart disease Father   . Diabetes Father   . Diabetes Brother   . Breast cancer Maternal Aunt 56  . Cancer Paternal Aunt        unk type, tumor on head  . Lung cancer Cousin 39  . Cancer Cousin 38  . Colon cancer Neg Hx   . Esophageal cancer Neg Hx   . Stomach cancer Neg Hx      Current medication list and allergy/intolerance information reviewed:    Current Outpatient Medications  Medication Sig Dispense Refill  . acetaminophen (TYLENOL) 500 MG tablet Take 1,000 mg by mouth 2 (two) times daily.    Marland Kitchen aspirin 81 MG tablet Take 81 mg by mouth daily.    Marland Kitchen atorvastatin (LIPITOR) 40 MG tablet Take 1 tablet (40 mg total) by mouth every evening. 90 tablet 3  . cyclobenzaprine (FLEXERIL) 10 MG tablet Take 0.5-1 tablets (5-10 mg total) by mouth 3 (three) times daily as needed for muscle spasms. Caution: can cause drowsiness 60 tablet 1  . enoxaparin (LOVENOX) 120 MG/0.8ML injection Inject 0.8 mLs (120 mg total) into the skin every 12 (twelve) hours for 10 days. 16 mL 0  .  metoprolol succinate (TOPROL XL) 25 MG 24 hr tablet Take 1 tablet (25 mg total) by mouth daily. 90 tablet 3  . nitroGLYCERIN (NITROSTAT) 0.4 MG SL tablet Place 1 tablet (0.4 mg total) under the tongue every 5 (five) minutes x 3 doses as needed for chest pain. 25 tablet 3  . Omega-3 Fatty Acids (FISH OIL) 1200 MG CAPS Take 1,200 mg by mouth daily.    . sacubitril-valsartan (ENTRESTO) 24-26 MG Take 1 tablet by mouth 2 (two) times daily. 180 tablet 3  . Semaglutide, 1 MG/DOSE, (OZEMPIC, 1 MG/DOSE,) 2 MG/1.5ML SOPN Inject 1 mg into the skin once a week. (Patient taking differently: Inject 1 mg into the skin once a week. Sunday for weight loss not for dm) 5 pen 11  . sildenafil (REVATIO) 20 MG tablet Take 1 tablet (20 mg total) by mouth 3 (three) times daily. 270 tablet 3   . torsemide (DEMADEX) 20 MG tablet TAKE 2 TABLET BY MOUTH EVERY DAY 60 tablet 11  . TRELEGY ELLIPTA 100-62.5-25 MCG/INH AEPB INHALE 1 PUFF INTO THE LUNGS DAILY 120 each PRN  . warfarin (COUMADIN) 4 MG tablet TALE 4 MG DAILY EXCEPT TAKE 6MG  ON  SATURDAYS OR AS DIRECTED BY ANTICOAGULATION CLINIC 120 tablet 2  . spironolactone (ALDACTONE) 25 MG tablet TAKE 1 TABLET(25 MG) BY MOUTH DAILY 90 tablet 1   No current facility-administered medications for this visit.    Allergies  Allergen Reactions  . Ramipril Cough    cough  . Percocet [Oxycodone-Acetaminophen]     In ICU it contributed to hallucinations. Can take by itself      Review of Systems:  Constitutional:  No  fever, no chills, No recent illness, No unintentional weight changes. No significant fatigue.   Cardiac: No  chest pain, No  Pressure, NO leg swelling   Respiratory:  No  shortness of breath.   Gastrointestinal: +  abdominal pain, No  nausea, No  vomiting,  No changes in bowel movements  Skin: No  Rash, + bruising over left flank surrounding hard mass in L flank  Hem/Onc: +  easy bruising/bleeding  Endocrine: No polyuria/polydipsia/polyphagia   Neurologic: No  dizziness   Exam:  BP 114/78 (BP Location: Left Arm, Patient Position: Sitting, Cuff Size: Large)   Pulse 74   Temp 98 F (36.7 C) (Oral)   Wt 268 lb 0.6 oz (121.6 kg)   SpO2 100%   BMI 37.38 kg/m   Constitutional: VS see above. General Appearance: alert, well-developed, well-nourished, NAD  Eyes: Normal lids and conjunctive, non-icteric sclera  Respiratory: Normal respiratory effort. no wheeze, no rhonchi, no rales  Cardiovascular: mechanical S1, S2 normal, no murmur, no rub/gallop auscultated. RRR. No lower extremity edema.   Gastrointestinal: + tender, hard mass in L flank 1.5 in x 0.5 in with surrounding yellow/purple bruising. No hepatomegaly, no splenomegaly. No hernia appreciated. Rectal exam deferred.   Skin: warm, dry, intact.    Psychiatric: Normal judgment/insight. Normal mood and affect.    Results for orders placed or performed in visit on 12/22/20 (from the past 72 hour(s))  POCT HgB A1C     Status: None   Collection Time: 12/22/20  8:53 AM  Result Value Ref Range   Hemoglobin A1C 4.9 4.0 - 5.6 %   HbA1c POC (<> result, manual entry)     HbA1c, POC (prediabetic range)     HbA1c, POC (controlled diabetic range)      No results found.   ASSESSMENT/PLAN: The primary encounter diagnosis  was Controlled type 2 diabetes mellitus without complication, without long-term current use of insulin ([place]). A diagnosis of Annual physical exam was also pertinent to this visit.   Diabetes  Pt will send copy of letter from insurance regarding ozempic no longer being covered  Otherwise, plan to restart ozempic at 0.5 mg and up titrate to 1 mg over a couple of weeks once Lovenox injections discontinued.   Coverage denial may be because hje's prediabetic? He'd like to stay on this Rx - will send Saxenda or [facility] or see what's on formulary for weight loss / A1C control   Follow up in 6 months  Hematoma on abdominal wall d/t lovenox injection at that site   Reccommended warm compresses  Should resolve over time; return if symptoms worsen or do not improve  Health maintenance  Refill of spironolactone    Orders Placed This Encounter  Procedures  . POCT HgB A1C    Meds ordered this encounter  Medications  . spironolactone (ALDACTONE) 25 MG tablet    Sig: TAKE 1 TABLET(25 MG) BY MOUTH DAILY    Dispense:  90 tablet    Refill:  1    There are no Patient Instructions on file for this visit.      Visit summary with medication list and pertinent instructions was printed for patient to review. All questions at time of visit were answered - patient instructed to contact office with any additional concerns or updates. ER/RTC precautions were reviewed with the patient.    Please note: voice recognition  software was used to produce this document, and typos may escape review. Please contact Dr. Sheppard Coil for any needed clarifications.     Follow-up plan: Return in about 6 months (around 06/21/2021) for ANNUAL CHECK-UP - SEE [place] SOONER IF NEEDED.

## 2020-12-26 ENCOUNTER — Ambulatory Visit (INDEPENDENT_AMBULATORY_CARE_PROVIDER_SITE_OTHER): Payer: 59 | Admitting: *Deleted

## 2020-12-26 DIAGNOSIS — I4891 Unspecified atrial fibrillation: Secondary | ICD-10-CM | POA: Diagnosis not present

## 2020-12-26 DIAGNOSIS — Z954 Presence of other heart-valve replacement: Secondary | ICD-10-CM

## 2020-12-26 DIAGNOSIS — Z5181 Encounter for therapeutic drug level monitoring: Secondary | ICD-10-CM | POA: Diagnosis not present

## 2020-12-26 LAB — POCT INR: INR: 1.9 — AB (ref 2.0–3.0)

## 2020-12-26 NOTE — Patient Instructions (Signed)
Take warfarin 2 tablets tonight, 1 1/2 tablets tomorrow night then resume 1 tablet daily except 1 1/2 tablets on Wednesdays and Saturdays Stop lovenox after tomorrow Recheck INR in 2 wks

## 2021-01-10 ENCOUNTER — Ambulatory Visit (INDEPENDENT_AMBULATORY_CARE_PROVIDER_SITE_OTHER): Payer: 59 | Admitting: *Deleted

## 2021-01-10 DIAGNOSIS — Z954 Presence of other heart-valve replacement: Secondary | ICD-10-CM | POA: Diagnosis not present

## 2021-01-10 DIAGNOSIS — I4891 Unspecified atrial fibrillation: Secondary | ICD-10-CM | POA: Diagnosis not present

## 2021-01-10 DIAGNOSIS — Z5181 Encounter for therapeutic drug level monitoring: Secondary | ICD-10-CM

## 2021-01-10 LAB — POCT INR: INR: 3 (ref 2.0–3.0)

## 2021-01-10 NOTE — Patient Instructions (Signed)
Continue warfarin 1 tablet daily except 1 1/2 tablets on Wednesdays and Saturdays Recheck INR in 4 wks

## 2021-01-23 ENCOUNTER — Telehealth: Payer: Self-pay | Admitting: Licensed Clinical Social Worker

## 2021-02-07 ENCOUNTER — Ambulatory Visit (INDEPENDENT_AMBULATORY_CARE_PROVIDER_SITE_OTHER): Payer: 59 | Admitting: *Deleted

## 2021-02-07 DIAGNOSIS — Z5181 Encounter for therapeutic drug level monitoring: Secondary | ICD-10-CM

## 2021-02-07 DIAGNOSIS — Z954 Presence of other heart-valve replacement: Secondary | ICD-10-CM | POA: Diagnosis not present

## 2021-02-07 DIAGNOSIS — I4891 Unspecified atrial fibrillation: Secondary | ICD-10-CM | POA: Diagnosis not present

## 2021-02-07 LAB — POCT INR: INR: 2.2 (ref 2.0–3.0)

## 2021-02-07 NOTE — Patient Instructions (Signed)
Take warfarin 1 1/2 tablets tonight then resume 1 tablet daily except 1 1/2 tablets on Wednesdays and Saturdays Recheck INR in 4 wks

## 2021-02-09 NOTE — Telephone Encounter (Signed)
Attempted to contact x3 by phone and email with genetic test results, no response.

## 2021-03-07 ENCOUNTER — Telehealth: Payer: Self-pay | Admitting: Cardiology

## 2021-03-07 ENCOUNTER — Telehealth: Payer: Self-pay | Admitting: *Deleted

## 2021-03-07 MED ORDER — AMOXICILLIN 500 MG PO CAPS: ORAL_CAPSULE | ORAL | 3 refills | Status: DC

## 2021-03-07 NOTE — Addendum Note (Signed)
Addended by: Genia Perin E on: 03/07/2021 03:06 PM   Modules accepted: Orders

## 2021-03-07 NOTE — Telephone Encounter (Signed)
Will need to specify how many extractions pt is having done. If he's having 1-2 teeth removed, would prefer for pt to continue on warfarin. If he's having 3+ teeth extracted and his warfarin needs to be held, he will require bridging with Lovenox due to his history of mechanical MVR. He will also require pre op antibiotics regardless of # of extractions. I have sent rx for amoxicillin to pt's pharmacy.

## 2021-03-07 NOTE — Telephone Encounter (Signed)
Please comment on warfarin. Thanks! 

## 2021-03-07 NOTE — Telephone Encounter (Signed)
Pt called stating that he has some tooth pain and wanted to know if he can get an ABX sent to his pharmacy.  Pt was advised that this not something that we can do for him without being seen.   Encouraged pt to call his dentist office to see if they can send in an antibiotic until his appt but I also told him to schedule an appt just in case they denied his request.  Pt transferred to scheduling for an appt.

## 2021-03-07 NOTE — Telephone Encounter (Signed)
New Message   1. What dental office are you calling from? Dr Jacqulynn Cadet  2. What is your office phone number? [phone]  What is your fax number? [phone]  3. What type of procedure is the patient having performed? extraction  4. What date is procedure scheduled or is the patient there now? TBD ASAP after pt is cleared  (if the patient is at the dentist's office question goes to their cardiologist if he/she is in the office.  If not, question should go to the DOD).   5. What is your question (ex. Antibiotics prior to procedure, holding medication-we need to know how long dentist wants pt to hold med)? Coumadin    How long should patient hold his coumadin ?  Or do you want him sent to a specialist?

## 2021-03-08 NOTE — Telephone Encounter (Signed)
Patient may remain on warfarin for the 1 tooth extraction

## 2021-03-08 NOTE — Telephone Encounter (Signed)
S/w Tina with Dr. Jacqulynn Cadet, DDS office. Confirmed at this time it is one tooth to be extracted. Otila Kluver, did state she did get the notes from earlier with possible recommendations. Otila Kluver, did also state that once they will let the pt know if it is going to be more detailed they may need to refer out to an oral Surgeon. Otila Kluver, states if they are referring out they will let our office know as well. I thanked tina for her help. I will update the pre op provider.

## 2021-03-09 ENCOUNTER — Ambulatory Visit (INDEPENDENT_AMBULATORY_CARE_PROVIDER_SITE_OTHER): Payer: 59 | Admitting: *Deleted

## 2021-03-09 ENCOUNTER — Other Ambulatory Visit: Payer: Self-pay

## 2021-03-09 DIAGNOSIS — Z954 Presence of other heart-valve replacement: Secondary | ICD-10-CM

## 2021-03-09 DIAGNOSIS — Z5181 Encounter for therapeutic drug level monitoring: Secondary | ICD-10-CM

## 2021-03-09 DIAGNOSIS — I4891 Unspecified atrial fibrillation: Secondary | ICD-10-CM | POA: Diagnosis not present

## 2021-03-09 LAB — POCT INR: INR: 2.1 (ref 2.0–3.0)

## 2021-03-09 NOTE — Telephone Encounter (Signed)
S/w the pt and informed him that he has been cleared for his dental procedure. Pt is aware of recommendations to stay on Warfarin as well as he will take Amoxicillin 2 g 30-60 minutes before dental procedure. Pt thanked me for the call and the help. I will fax notes to requesting office.

## 2021-03-09 NOTE — Telephone Encounter (Signed)
   Patient Name: Jeffrey Larson  DOB: 10/03/1956  MRN: [id number]   Primary Cardiologist: Carlyle Dolly, MD  Chart reviewed as part of pre-operative protocol coverage.   IF SIMPLE EXTRACTION/CLEANINGS: Simple dental extractions are considered low risk procedures per guidelines and generally do not require any specific cardiac clearance. It is also generally accepted that for simple extractions and dental cleanings, there is no need to interrupt blood thinner therapy. H/o mechanical MVR. Patient is only having 1 extraction, continue warfarin for per-operative period, no bridging is required per pharmacy recommendations.    SBE prophylaxis is required for the patient from a cardiac standpoint. Amoxicillin was sent in to pharmacy.   I will route this recommendation to the requesting party via Epic fax function and remove from pre-op pool.  Please call with questions.  Loyalty Brashier Ninfa Meeker, PA-C 03/09/2021, 9:49 AM

## 2021-03-09 NOTE — Patient Instructions (Signed)
Increase warfarin to 1 1/2 tablets daily except 1 tablet on Mondays, Wednesdays and Fridays Recheck in 3 weeks Waiting on dentist to schedule 1 extraction.  OK to stay on warfarin for 1 tooth.  See clearance note.

## 2021-03-13 ENCOUNTER — Other Ambulatory Visit: Payer: Self-pay | Admitting: Osteopathic Medicine

## 2021-03-30 ENCOUNTER — Ambulatory Visit (INDEPENDENT_AMBULATORY_CARE_PROVIDER_SITE_OTHER): Payer: 59 | Admitting: *Deleted

## 2021-03-30 DIAGNOSIS — Z5181 Encounter for therapeutic drug level monitoring: Secondary | ICD-10-CM

## 2021-03-30 DIAGNOSIS — I4891 Unspecified atrial fibrillation: Secondary | ICD-10-CM

## 2021-03-30 DIAGNOSIS — Z954 Presence of other heart-valve replacement: Secondary | ICD-10-CM | POA: Diagnosis not present

## 2021-03-30 LAB — POCT INR: INR: 3.6 — AB (ref 2.0–3.0)

## 2021-03-30 NOTE — Patient Instructions (Signed)
Take warfarin 1/2 tablet tonight then resume 1 1/2 tablets daily except 1 tablet on Mondays, Wednesdays and Fridays Recheck in 4 weeks 

## 2021-04-27 ENCOUNTER — Ambulatory Visit (INDEPENDENT_AMBULATORY_CARE_PROVIDER_SITE_OTHER): Payer: 59 | Admitting: *Deleted

## 2021-04-27 DIAGNOSIS — Z5181 Encounter for therapeutic drug level monitoring: Secondary | ICD-10-CM | POA: Diagnosis not present

## 2021-04-27 DIAGNOSIS — Z954 Presence of other heart-valve replacement: Secondary | ICD-10-CM

## 2021-04-27 DIAGNOSIS — I4891 Unspecified atrial fibrillation: Secondary | ICD-10-CM

## 2021-04-27 LAB — POCT INR: INR: 2.9 (ref 2.0–3.0)

## 2021-04-27 NOTE — Patient Instructions (Signed)
Continue warfarin 1 1/2 tablets daily except 1 tablet on Mondays, Wednesdays and Fridays Recheck in 4 weeks

## 2021-05-25 ENCOUNTER — Ambulatory Visit (INDEPENDENT_AMBULATORY_CARE_PROVIDER_SITE_OTHER): Payer: 59 | Admitting: *Deleted

## 2021-05-25 DIAGNOSIS — Z954 Presence of other heart-valve replacement: Secondary | ICD-10-CM

## 2021-05-25 DIAGNOSIS — I4891 Unspecified atrial fibrillation: Secondary | ICD-10-CM

## 2021-05-25 DIAGNOSIS — Z5181 Encounter for therapeutic drug level monitoring: Secondary | ICD-10-CM | POA: Diagnosis not present

## 2021-05-25 LAB — POCT INR: INR: 4.1 — AB (ref 2.0–3.0)

## 2021-05-25 NOTE — Patient Instructions (Signed)
Hold warfarin tonight then resume 1 1/2 tablets daily except 1 tablet on Mondays, Wednesdays and Fridays Recheck in 3 weeks

## 2021-06-03 ENCOUNTER — Other Ambulatory Visit: Payer: Self-pay | Admitting: Physician Assistant

## 2021-06-13 ENCOUNTER — Encounter: Payer: Self-pay | Admitting: Cardiology

## 2021-06-13 ENCOUNTER — Other Ambulatory Visit: Payer: Self-pay

## 2021-06-13 ENCOUNTER — Ambulatory Visit (INDEPENDENT_AMBULATORY_CARE_PROVIDER_SITE_OTHER): Payer: 59 | Admitting: Cardiology

## 2021-06-13 ENCOUNTER — Ambulatory Visit (INDEPENDENT_AMBULATORY_CARE_PROVIDER_SITE_OTHER): Payer: 59 | Admitting: *Deleted

## 2021-06-13 VITALS — BP 110/68 | HR 64 | Ht 71.0 in | Wt 250.6 lb

## 2021-06-13 DIAGNOSIS — Z954 Presence of other heart-valve replacement: Secondary | ICD-10-CM

## 2021-06-13 DIAGNOSIS — I4891 Unspecified atrial fibrillation: Secondary | ICD-10-CM

## 2021-06-13 DIAGNOSIS — E782 Mixed hyperlipidemia: Secondary | ICD-10-CM

## 2021-06-13 DIAGNOSIS — I2510 Atherosclerotic heart disease of native coronary artery without angina pectoris: Secondary | ICD-10-CM

## 2021-06-13 DIAGNOSIS — Z5181 Encounter for therapeutic drug level monitoring: Secondary | ICD-10-CM

## 2021-06-13 LAB — POCT INR: INR: 5.4 — AB (ref 2.0–3.0)

## 2021-06-13 NOTE — Progress Notes (Signed)
Clinical Summary Jeffrey Larson is a 65 y.o.maleseen today for follow up of the following medical problems.      1. History of MVR for rheumatic mitral stenosis - On 07/18/2018 he underwent mitral valve replacement with mechanical mitral valve, tricuspid valve repair using an Edwards mc3 ring annuloplasty , and Maze procedure -Sorin Carbomedics Optiform bileaflet 33 mm mechanical valve on 07/18/2018.    - Jan 2020 echo LVEF 55-60%, normal MVR   - no SOB/DOE/, no LE edema - no bleeding on coumadin and aspirin     2. Permanent afib - no recent palpitations   3. CAD - MI in 2013 with stenting   - He underwent cardiac catheterization on 06/10/2018.  He had nonobstructive disease with a mid RCA 50% stenosis and otherwise 20% stenosis seen in the proximal left circumflex and ostial to proximal LAD.  no chest pains   4. Chronic diastolic HF - taking torsemide 20mg  daily, denies any LE edema  - had some prior LV dysfunction, 05/2018 TEE LVEF 35-40% - Jan 2020 echo 55-60%     5. COPD     6. COVID in 06/2019   7. Pulmonary HTN - has been on sildenafil per CHF clinic   8. HTN - he remains compliant with med   9. Hyperlipidemia - LDL has been at goal, was 61 in 05/2020  - lbas followed by pcp, he is on atorvastatin 40mg  daily   Past Medical History:  Diagnosis Date   Atrial fibrillation (HCC)    hx of   Cancer ([place])    skin cancer removed from arm dec 2021 melanoma   Chronic combined systolic and diastolic congestive heart failure (HCC)    COPD with chronic bronchitis ([place])    Coronary artery disease    COVID-19 virus infection 06/2020   all symptoms resolved   Family history of breast cancer    Family history of breast cancer    Family history of lung cancer    History of kidney stones    Hyperlipidemia    Hypertension    Longstanding persistent atrial fibrillation (HCC)    Mitral stenosis with regurgitation    Myocardial infarction ([place]) 2015   stents x 2    Obesity    Personal history of colonic polyps    Pneumonia 2015 and 06-2020   Pulmonary hypertension (HCC)    S/P Maze operation for atrial fibrillation 07/18/2018   Complete bilateral atrial lesion set using bipolar radiofrequency and cryothermy ablation with clipping of LA appendage   S/P mitral valve replacement with metallic valve 02/06/9701   Sorin Carbomedics Optiform bileaflet mechanical valve, size 33 mm   S/P TVR (tricuspid valve repair) 07/18/2018   Edwards mc3 ring annuloplasty, size 30 mm   Wears partial dentures    upper and lower     Allergies  Allergen Reactions   Ramipril Cough    cough   Percocet [Oxycodone-Acetaminophen]     In ICU it contributed to hallucinations. Can take by itself     Current Outpatient Medications  Medication Sig Dispense Refill   acetaminophen (TYLENOL) 500 MG tablet Take 1,000 mg by mouth 2 (two) times daily.     amoxicillin (AMOXIL) 500 MG capsule Take 4 capsules by mouth 30-60 minutes prior to dental work. 4 capsule 3   aspirin 81 MG tablet Take 81 mg by mouth daily.     atorvastatin (LIPITOR) 40 MG tablet Take 1 tablet (40 mg total) by mouth every evening.  90 tablet 3   cyclobenzaprine (FLEXERIL) 10 MG tablet Take 0.5-1 tablets (5-10 mg total) by mouth 3 (three) times daily as needed for muscle spasms. Caution: can cause drowsiness 60 tablet 1   enoxaparin (LOVENOX) 120 MG/0.8ML injection Inject 0.8 mLs (120 mg total) into the skin every 12 (twelve) hours for 10 days. 16 mL 0   metoprolol succinate (TOPROL-XL) 25 MG 24 hr tablet TAKE 1 TABLET(25 MG) BY MOUTH DAILY 90 tablet 3   nitroGLYCERIN (NITROSTAT) 0.4 MG SL tablet Place 1 tablet (0.4 mg total) under the tongue every 5 (five) minutes x 3 doses as needed for chest pain. 25 tablet 3   Omega-3 Fatty Acids (FISH OIL) 1200 MG CAPS Take 1,200 mg by mouth daily.     OZEMPIC, 1 MG/DOSE, 4 MG/3ML SOPN INJECT 1 MG INTO THE SKIN ONCE A WEEK 9 mL 1   sacubitril-valsartan (ENTRESTO) 24-26 MG Take 1  tablet by mouth 2 (two) times daily. 180 tablet 3   sildenafil (REVATIO) 20 MG tablet Take 1 tablet (20 mg total) by mouth 3 (three) times daily. 270 tablet 3   spironolactone (ALDACTONE) 25 MG tablet TAKE 1 TABLET(25 MG) BY MOUTH DAILY 90 tablet 1   torsemide (DEMADEX) 20 MG tablet TAKE 2 TABLET BY MOUTH EVERY DAY 60 tablet 11   TRELEGY ELLIPTA 100-62.5-25 MCG/INH AEPB INHALE 1 PUFF INTO THE LUNGS DAILY 120 each PRN   warfarin (COUMADIN) 4 MG tablet TALE 4 MG DAILY EXCEPT TAKE 6MG  ON  SATURDAYS OR AS DIRECTED BY ANTICOAGULATION CLINIC 120 tablet 2   No current facility-administered medications for this visit.     Past Surgical History:  Procedure Laterality Date   APPENDECTOMY     as a child   CARDIOVASCULAR STRESS TEST  03/2014   Borderline reversible ischemic changes at the apex.  Normal LV contractility/EF 52%.   CORONARY STENT PLACEMENT  7/12013   LEFT HEART CATHETERIZATION WITH CORONARY ANGIOGRAM N/A 06/12/2012   Procedure: LEFT HEART CATHETERIZATION WITH CORONARY ANGIOGRAM;  Surgeon: Clent Demark, MD;  Location: [facility];  Service: Cardiovascular;  Laterality: N/A;   MAZE N/A 07/18/2018   Procedure: MAZE;  Surgeon: Rexene Alberts, MD;  Location: [facility];  Service: Open Heart Surgery;  Laterality: N/A;   MITRAL VALVE REPLACEMENT N/A 07/18/2018   Procedure: MITRAL VALVE (MV) REPLACEMENT WITH CARBOMEDICS OPTIFORM MITRAL VALVE SIZE 33.;  Surgeon: Rexene Alberts, MD;  Location: [facility];  Service: Open Heart Surgery;  Laterality: N/A;   PERCUTANEOUS CORONARY STENT INTERVENTION (PCI-S) N/A 06/17/2012   Procedure: PERCUTANEOUS CORONARY STENT INTERVENTION (PCI-S);  Surgeon: Clent Demark, MD;  Location[facility] C[facility] LAB;  Service: Cardiovascular;  Laterality: N/A;   RECTAL EXAM UNDER ANESTHESIA N/A 12/15/2020   Procedure: ANORECTAL EXAM UNDER ANESTHESIA EXCISION OF ANAL CANAL POLYP;  Surgeon: Ileana Roup, MD;  Location: [facility];  Service: General;  Laterality: N/A;    RIGHT/LEFT HEART CATH AND CORONARY ANGIOGRAPHY N/A 06/10/2018   Procedure: RIGHT/LEFT HEART CATH AND CORONARY ANGIOGRAPHY;  Surgeon: Dixie Dials, MD;  Location: [facility] CV LAB;  Service: Cardiovascular;  Laterality: N/A;   TEE WITHOUT CARDIOVERSION N/A 06/10/2018   Procedure: TRANSESOPHAGEAL ECHOCARDIOGRAM (TEE) with bubble study;  Surgeon: Dixie Dials, MD;  Location: [facility] ENDOSCOPY;  Service: Cardiovascular;  Laterality: N/A;   TEE WITHOUT CARDIOVERSION N/A 07/18/2018   Procedure: TRANSESOPHAGEAL ECHOCARDIOGRAM (TEE);  Surgeon: Rexene Alberts, MD;  Location: [place];  Service: Open Heart Surgery;  Laterality: N/A;  TRICUSPID VALVE REPLACEMENT N/A 07/18/2018   Procedure: TRICUSPID VALVE REPAIR WITH EDWARDS MC3 TRICUSPID ANNULOPLASTY RING SIZE 30.;  Surgeon: Rexene Alberts, MD;  Location: [place];  Service: Open Heart Surgery;  Laterality: N/A;     Allergies  Allergen Reactions   Ramipril Cough    cough   Percocet [Oxycodone-Acetaminophen]     In ICU it contributed to hallucinations. Can take by itself      Family History  Problem Relation Age of Onset   Heart disease Mother    Heart disease Father    Diabetes Father    Diabetes Brother    Breast cancer Maternal Aunt 45   Cancer Paternal Aunt        unk type, tumor on head   Lung cancer Cousin 28   Cancer Cousin 38   Colon cancer Neg Hx    Esophageal cancer Neg Hx    Stomach cancer Neg Hx      Social History Mr. Prisk reports that he quit smoking about 24 years ago. His smoking use included cigarettes. He has a 30.00 pack-year smoking history. He has never used smokeless tobacco. Mr. Volden reports no history of alcohol use.   Review of Systems CONSTITUTIONAL: No weight loss, fever, chills, weakness or fatigue.  HEENT: Eyes: No visual loss, blurred vision, double vision or yellow sclerae.No hearing loss, sneezing, congestion, runny nose or sore throat.  SKIN: No rash or itching.  CARDIOVASCULAR: per  hpi RESPIRATORY: No shortness of breath, cough or sputum.  GASTROINTESTINAL: No anorexia, nausea, vomiting or diarrhea. No abdominal pain or blood.  GENITOURINARY: No burning on urination, no polyuria NEUROLOGICAL: No headache, dizziness, syncope, paralysis, ataxia, numbness or tingling in the extremities. No change in bowel or bladder control.  MUSCULOSKELETAL: No muscle, back pain, joint pain or stiffness.  LYMPHATICS: No enlarged nodes. No history of splenectomy.  PSYCHIATRIC: No history of depression or anxiety.  ENDOCRINOLOGIC: No reports of sweating, cold or heat intolerance. No polyuria or polydipsia.  Marland Kitchen   Physical Examination Today's Vitals   06/13/21 0834  BP: 110/68  Pulse: 64  SpO2: 98%  Weight: 250 lb 9.6 oz (113.7 kg)  Height: 5\' 11"  (1.803 m)   Body mass index is 34.95 kg/m.  Gen: resting comfortably, no acute distress HEENT: no scleral icterus, pupils equal round and reactive, no palptable cervical adenopathy,  CV Resp: Clear to auscultation bilaterally GI: abdomen is soft, non-tender, non-distended, normal bowel sounds, no hepatosplenomegaly MSK: extremities are warm, no edema.  Skin: warm, no rash Neuro:  no focal deficits Psych: appropriate affect     Assessment and Plan  1. History of MV replacment/mechnical MV - no symptoms, normal MVR by Jan 2020 echo - continue coumadin/asa   2. Afib - no recent symptoms, continue current meds - ekg today shows rate controlled afib   3. Hyperlipidemia - continue atorvastatin, labs followed by pcp  4. CAD - denies symptoms, continue current meds   5. Chronic diastolic - euvolemic, continue torsemide.          Arnoldo Lenis, M.D

## 2021-06-13 NOTE — Patient Instructions (Signed)
Hold warfarin tonight and tomorrow night then decrease dose to 1 tablet daily except 1 1/2 tablets on Monday and Thursdays Recheck in 2 weeks

## 2021-06-13 NOTE — Patient Instructions (Signed)
Medication Instructions:  Continue all current medications.   Labwork: none  Testing/Procedures: none  Follow-Up: 6 months   Any Other Special Instructions Will Be Listed Below (If Applicable).   If you need a refill on your cardiac medications before your next appointment, please call your pharmacy.  

## 2021-06-20 ENCOUNTER — Other Ambulatory Visit (HOSPITAL_COMMUNITY): Payer: Self-pay | Admitting: Internal Medicine

## 2021-06-21 ENCOUNTER — Other Ambulatory Visit: Payer: Self-pay

## 2021-06-21 ENCOUNTER — Ambulatory Visit (INDEPENDENT_AMBULATORY_CARE_PROVIDER_SITE_OTHER): Payer: 59 | Admitting: Osteopathic Medicine

## 2021-06-21 VITALS — BP 113/60 | HR 63 | Temp 97.2°F | Wt 259.1 lb

## 2021-06-21 DIAGNOSIS — Z954 Presence of other heart-valve replacement: Secondary | ICD-10-CM | POA: Diagnosis not present

## 2021-06-21 DIAGNOSIS — I10 Essential (primary) hypertension: Secondary | ICD-10-CM

## 2021-06-21 DIAGNOSIS — L578 Other skin changes due to chronic exposure to nonionizing radiation: Secondary | ICD-10-CM | POA: Diagnosis not present

## 2021-06-21 DIAGNOSIS — E119 Type 2 diabetes mellitus without complications: Secondary | ICD-10-CM | POA: Diagnosis not present

## 2021-06-21 DIAGNOSIS — Z0000 Encounter for general adult medical examination without abnormal findings: Secondary | ICD-10-CM | POA: Diagnosis not present

## 2021-06-21 MED ORDER — OZEMPIC (1 MG/DOSE) 4 MG/3ML ~~LOC~~ SOPN: 1.0000 mg | PEN_INJECTOR | SUBCUTANEOUS | 11 refills | Status: DC

## 2021-06-21 MED ORDER — TRELEGY ELLIPTA 100-62.5-25 MCG/INH IN AEPB: 1.0000 | INHALATION_SPRAY | Freq: Every day | RESPIRATORY_TRACT | 99 refills | Status: DC

## 2021-06-21 MED ORDER — SPIRONOLACTONE 25 MG PO TABS: ORAL_TABLET | ORAL | 3 refills | Status: DC

## 2021-06-21 MED ORDER — ATORVASTATIN CALCIUM 40 MG PO TABS: 40.0000 mg | ORAL_TABLET | Freq: Every evening | ORAL | 3 refills | Status: DC

## 2021-06-21 NOTE — Patient Instructions (Addendum)
General Preventive Care Most recent routine screening labs: ordered.  Blood pressure goal 130/80 or less.  Tobacco: don't!  Alcohol: responsible moderation is ok for most adults - if you have concerns about your alcohol intake, please talk to me!  Exercise: as tolerated to reduce risk of cardiovascular disease and diabetes. Strength training will also prevent osteoporosis.  Mental health: if need for mental health care (medicines, counseling, other), or concerns about moods, please let me know!  Sexual / Reproductive health: if need for STD testing, or if concerns with libido/pain problems, please let me know!  Advanced Directive: Living Will and/or Healthcare Power of Attorney recommended for all adults, regardless of age or health.  Vaccines Flu vaccine: for almost everyone, every fall.  Shingles vaccine: after age 60.  Pneumonia vaccines: after age 65. Tetanus booster: every 10 years, due 2031 COVID vaccine: THANKS for getting your vaccine! :)  Cancer screenings  Colon cancer screening: Colonoscopy per Dr Vivia Ewing office Prostate cancer screening: PSA blood test age 44+ Lung cancer screening: not needed since quit smoking >15 years ago Infection screenings  HIV: recommended screening at least once age 67-65, more often as needed. Gonorrhea/Chlamydia: screening as needed Hepatitis C: recommended once for everyone age 0000000 TB: certain at-risk populations, Other Abdominal Aortic Aneurysm: screening with ultrasound recommended once for men age 50-75 who have ever smoked                         FYI to patients: After six years here, I will be leaving practice at [facility]. My last day here will be 08/25/2021, and I will continue to provide your care up until that date!   After that, my patients have a few choices:  1) you can establish care with Dr. Luetta Nutting or Samuel Bouche NP, who are accepting new patients here, OR...  2) you  can see any available provider on as-needed basis until my official replacement starts for my position (hiring a new person has not been finalized yet), OR.Marland KitchenMarland Kitchen 3) this office will be happy to facilitate transfer of records if you choose to seek care elsewhere, and we will take care of medication refills on a case-by-case basis until patients establish with a new office.   It is bittersweet to leave!  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 I have truly enjoyed taking care of folks here, but I am also excited for my next adventure. Take care, and please let us know if you have any questions!   -Dr. Loni Muse.

## 2021-06-21 NOTE — Progress Notes (Signed)
Jeffrey Larson is a 65 y.o. male who presents to  [place] [facility]  today, 06/21/21, seeking care for the following:  Annual physical  Skin spots: 2 on L arm, 1 on L ear, 1 on R cheek -exam consistent with actinic damage.  See below     ASSESSMENT & PLAN with other pertinent findings:  The primary encounter diagnosis was Annual physical exam. Diagnoses of Controlled type 2 diabetes mellitus without complication, without long-term current use of insulin ([place]), Essential hypertension, benign, S/P mitral valve replacement with metallic valve , and Actinic skin damage were also pertinent to this visit.   1. Annual physical exam See below   2. Controlled type 2 diabetes mellitus without complication, without long-term current use of insulin (HCC) A1C pending w/ labs, has looked great!   3. Essential hypertension, benign BP Readings from Last 3 Encounters:  06/21/21 113/60  06/13/21 110/68  12/22/20 114/78   4. S/P mitral valve replacement with metallic valve  Following with cardiology for INR checks  5. Actinic skin damage x2 on L arm, 1 on L ear, 1 on R cheek Cryotherapy applied to areas of concern  Pt advised to let us know if not resolved please RTC for repeat treatment / consider biopsy    Patient Instructions  General Preventive Care Most recent routine screening labs: ordered.  Blood pressure goal 130/80 or less.  Tobacco: don't!  Alcohol: responsible moderation is ok for most adults - if you have concerns about your alcohol intake, please talk to me!  Exercise: as tolerated to reduce risk of cardiovascular disease and diabetes. Strength training will also prevent osteoporosis.  Mental health: if need for mental health care (medicines, counseling, other), or concerns about moods, please let me know!  Sexual / Reproductive health: if need for STD testing, or if concerns with libido/pain problems, please let me know!   Advanced Directive: Living Will and/or Healthcare Power of Attorney recommended for all adults, regardless of age or health.  Vaccines Flu vaccine: for almost everyone, every fall.  Shingles vaccine: after age 63.  Pneumonia vaccines: after age 19. Tetanus booster: every 10 years, due 2031 COVID vaccine: THANKS for getting your vaccine! :)  Cancer screenings  Colon cancer screening: Colonoscopy per Dr Vivia Ewing office Prostate cancer screening: PSA blood test age 69+ Lung cancer screening: not needed since quit smoking >15 years ago Infection screenings  HIV: recommended screening at least once age 52-65, more often as needed. Gonorrhea/Chlamydia: screening as needed Hepatitis C: recommended once for everyone age 0000000 TB: certain at-risk populations, Other Abdominal Aortic Aneurysm: screening with ultrasound recommended once for men age 20-75 who have ever smoked                         FYI to patients: After six years here, I will be leaving practice at [facility]. My last day here will be 08/25/2021, and I will continue to provide your care up until that date!   After that, my patients have a few choices:  1) you can establish care with Dr. Luetta Nutting or Samuel Bouche NP, who are accepting new patients here, OR...  2) you can see any available provider on as-needed basis until my official replacement starts for my position (hiring a new person has not been finalized yet), OR.Marland KitchenMarland Kitchen 3) this office will be happy to facilitate transfer of records if you choose to  seek care elsewhere, and we will take care of medication refills on a case-by-case basis until patients establish with a new office.   It is bittersweet to leave!  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 I have truly enjoyed taking care of folks here, but I am also excited for my  next adventure. Take care, and please let us know if you have any questions!   -Dr. Loni Muse.    Orders Placed This Encounter  Procedures   CBC   COMPLETE METABOLIC PANEL WITH GFR   Lipid panel   Hemoglobin A1c   PSA, Total with Reflex to PSA, Free    Meds ordered this encounter  Medications   atorvastatin (LIPITOR) 40 MG tablet    Sig: Take 1 tablet (40 mg total) by mouth every evening.    Dispense:  90 tablet    Refill:  3   Semaglutide, 1 MG/DOSE, (OZEMPIC, 1 MG/DOSE,) 4 MG/3ML SOPN    Sig: Inject 1 mg into the skin once a week.    Dispense:  9 mL    Refill:  11   spironolactone (ALDACTONE) 25 MG tablet    Sig: TAKE 1 TABLET(25 MG) BY MOUTH DAILY    Dispense:  90 tablet    Refill:  3   Fluticasone-Umeclidin-Vilant (TRELEGY ELLIPTA) 100-62.5-25 MCG/INH AEPB    Sig: Inhale 1 puff into the lungs daily.    Dispense:  120 each    Refill:  PRN     See below for relevant physical exam findings  See below for recent lab and imaging results reviewed  Medications, allergies, PMH, PSH, SocH, FamH reviewed below    Follow-up instructions: Return for monitor chronic conditions and establish with new provider in 6 months, see [place] sooner as needed! .                                        Exam:  BP 113/60 (BP Location: Left Arm, Patient Position: Sitting, Cuff Size: Large)   Pulse 63   Temp (!) 97.2 F (36.2 C) (Oral)   Wt 259 lb 1.9 oz (117.5 kg)   BMI 36.14 kg/m  Constitutional: VS see above. General Appearance: alert, well-developed, well-nourished, NAD Neck: No masses, trachea midline.  Respiratory: Normal respiratory effort. no wheeze, no rhonchi, no rales Cardiovascular: S1/S2 normal, no murmur, no rub/gallop auscultated. RRR.  Musculoskeletal: Gait normal. Symmetric and independent movement of all extremities Abdominal: non-tender, non-distended, no appreciable organomegaly, neg Murphy's, BS [id number] Neurological: Normal  balance/coordination. No tremor. Skin: warm, dry, intact.  Psychiatric: Normal judgment/insight. Normal mood and affect. Oriented x3.   Current Meds  Medication Sig   acetaminophen (TYLENOL) 500 MG tablet Take 1,000 mg by mouth 2 (two) times daily.   amoxicillin (AMOXIL) 500 MG capsule Take 4 capsules by mouth 30-60 minutes prior to dental work.   aspirin 81 MG tablet Take 81 mg by mouth daily.   cyclobenzaprine (FLEXERIL) 10 MG tablet Take 0.5-1 tablets (5-10 mg total) by mouth 3 (three) times daily as needed for muscle spasms. Caution: can cause drowsiness   metoprolol succinate (TOPROL-XL) 25 MG 24 hr tablet TAKE 1 TABLET(25 MG) BY MOUTH DAILY   nitroGLYCERIN (NITROSTAT) 0.4 MG SL tablet Place 1 tablet (0.4 mg total) under the tongue every 5 (five) minutes x 3 doses as needed  for chest pain.   Omega-3 Fatty Acids (FISH OIL) 1200 MG CAPS Take 1,200 mg by mouth daily.   sacubitril-valsartan (ENTRESTO) 24-26 MG Take 1 tablet by mouth 2 (two) times daily.   sildenafil (REVATIO) 20 MG tablet Take 1 tablet (20 mg total) by mouth 3 (three) times daily.   torsemide (DEMADEX) 20 MG tablet TAKE 2 TABLET BY MOUTH EVERY DAY   warfarin (COUMADIN) 4 MG tablet TALE 4 MG DAILY EXCEPT TAKE '6MG'$  ON  SATURDAYS OR AS DIRECTED BY ANTICOAGULATION CLINIC   [DISCONTINUED] atorvastatin (LIPITOR) 40 MG tablet Take 1 tablet (40 mg total) by mouth every evening.   [DISCONTINUED] OZEMPIC, 1 MG/DOSE, 4 MG/3ML SOPN INJECT 1 MG INTO THE SKIN ONCE A WEEK   [DISCONTINUED] spironolactone (ALDACTONE) 25 MG tablet TAKE 1 TABLET(25 MG) BY MOUTH DAILY   [DISCONTINUED] TRELEGY ELLIPTA 100-62.5-25 MCG/INH AEPB INHALE 1 PUFF INTO THE LUNGS DAILY    Allergies  Allergen Reactions   Ramipril Cough    cough   Percocet [Oxycodone-Acetaminophen]     In ICU it contributed to hallucinations. Can take by itself    Patient Active Problem List   Diagnosis Date Noted   Family history of lung cancer    Personal history of colonic  polyps    Family history of breast cancer    Enthesopathy of ankle 05/31/2020   2019 novel coronavirus disease (COVID-19) 07/08/2019   COVID-19 virus infection 07/08/2019   Encounter for therapeutic drug monitoring 08/04/2018   Pressure injury of skin 07/30/2018   S/P mitral valve replacement with metallic valve  99991111   S/P TVR (tricuspid valve repair) 07/18/2018   S/P Maze operation for atrial fibrillation 07/18/2018   Tricuspid valve insufficiency 07/15/2018   Morbid obesity ([place])    Pulmonary hypertension ([place])    Coronary artery disease    Controlled type 2 diabetes mellitus without complication, without long-term current use of insulin ([place]) 08/02/2017   Cardiac disease 04/18/2017   Longstanding persistent atrial fibrillation (HCC)    Acute respiratory failure with hypoxia (HCC)    Chronic combined systolic and diastolic heart failure ([place]) 11/12/2016   Bilateral low back pain without sciatica 04/30/2016   Hematuria, microscopic 04/30/2016   COPD with chronic bronchitis ([place]) 09/08/2014   History of MI (myocardial infarction) 08/04/2014   Hyperlipidemia 08/04/2014   Essential hypertension, benign 08/04/2014    Family History  Problem Relation Age of Onset   Heart disease Mother    Heart disease Father    Diabetes Father    Diabetes Brother    Breast cancer Maternal Aunt 38   Cancer Paternal Aunt        unk type, tumor on head   Lung cancer Cousin 71   Cancer Cousin 38   Colon cancer Neg Hx    Esophageal cancer Neg Hx    Stomach cancer Neg Hx     Social History   Tobacco Use  Smoking Status Former   Packs/day: 2.00   Years: 15.00   Pack years: 30.00   Types: Cigarettes   Quit date: 11/26/1996   Years since quitting: 24.5  Smokeless Tobacco Never    Past Surgical History:  Procedure Laterality Date   APPENDECTOMY     as a child   CARDIOVASCULAR STRESS TEST  03/2014   Borderline reversible ischemic changes at the apex.  Normal LV contractility/EF 52%.    CORONARY STENT PLACEMENT  7/12013   LEFT HEART CATHETERIZATION WITH CORONARY ANGIOGRAM N/A 06/12/2012   Procedure: LEFT  HEART CATHETERIZATION WITH CORONARY ANGIOGRAM;  Surgeon: Clent Demark, MD;  Location: [facility] CATH LAB;  Service: Cardiovascular;  Laterality: N/A;   MAZE N/A 07/18/2018   Procedure: MAZE;  Surgeon: Rexene Alberts, MD;  Location: [place];  Service: Open Heart Surgery;  Laterality: N/A;   MITRAL VALVE REPLACEMENT N/A 07/18/2018   Procedure: MITRAL VALVE (MV) REPLACEMENT WITH CARBOMEDICS OPTIFORM MITRAL VALVE SIZE 33.;  Surgeon: Rexene Alberts, MD;  Location: [place];  Service: Open Heart Surgery;  Laterality: N/A;   PERCUTANEOUS CORONARY STENT INTERVENTION (PCI-S) N/A 06/17/2012   Procedure: PERCUTANEOUS CORONARY STENT INTERVENTION (PCI-S);  Surgeon: Clent Demark, MD;  Location: [facility] CATH LAB;  Service: Cardiovascular;  Laterality: N/A;   RECTAL EXAM UNDER ANESTHESIA N/A 12/15/2020   Procedure: ANORECTAL EXAM UNDER ANESTHESIA EXCISION OF ANAL CANAL POLYP;  Surgeon: Ileana Roup, MD;  Location: [place];  Service: General;  Laterality: N/A;   RIGHT/LEFT HEART CATH AND CORONARY ANGIOGRAPHY N/A 06/10/2018   Procedure: RIGHT/LEFT HEART CATH AND CORONARY ANGIOGRAPHY;  Surgeon: Dixie Dials, MD;  Location: [place] CV LAB;  Service: Cardiovascular;  Laterality: N/A;   TEE WITHOUT CARDIOVERSION N/A 06/10/2018   Procedure: TRANSESOPHAGEAL ECHOCARDIOGRAM (TEE) with bubble study;  Surgeon: Dixie Dials, MD;  Location: [facility] ENDOSCOPY;  Service: Cardiovascular;  Laterality: N/A;   TEE WITHOUT CARDIOVERSION N/A 07/18/2018   Procedure: TRANSESOPHAGEAL ECHOCARDIOGRAM (TEE);  Surgeon: Rexene Alberts, MD;  Location: [place];  Service: Open Heart Surgery;  Laterality: N/A;   TRICUSPID VALVE REPLACEMENT N/A 07/18/2018   Procedure: TRICUSPID VALVE REPAIR WITH EDWARDS MC3 TRICUSPID ANNULOPLASTY RING SIZE 30.;  Surgeon: Rexene Alberts, MD;  Location: [place];  Service: Open Heart  Surgery;  Laterality: N/A;    Immunization History  Administered Date(s) Administered   Influenza,inj,Quad PF,6+ Mos 11/08/2016, 08/26/2017, 07/31/2018, 09/10/2019, 11/16/2020   PFIZER(Purple Top)SARS-COV-2 Vaccination 02/20/2020, 03/19/2020, 11/14/2020   Tdap 07/20/2015, 04/21/2020    Recent Results (from the past 2160 hour(s))  POCT INR     Status: Abnormal   Collection Time: 03/30/21  8:28 AM  Result Value Ref Range   INR 3.6 (A) 2.0 - 3.0  POCT INR     Status: Normal   Collection Time: 04/27/21  8:22 AM  Result Value Ref Range   INR 2.9 2.0 - 3.0  POCT INR     Status: Abnormal   Collection Time: 05/25/21  8:36 AM  Result Value Ref Range   INR 4.1 (A) 2.0 - 3.0  POCT INR     Status: Abnormal   Collection Time: 06/13/21  9:11 AM  Result Value Ref Range   INR 5.4 (A) 2.0 - 3.0    No results found.     All questions at time of visit were answered - patient instructed to contact office with any additional concerns or updates. ER/RTC precautions were reviewed with the patient as applicable.   Please note: manual typing as well as voice recognition software may have been used to produce this document - typos may escape review. Please contact Dr. Sheppard Coil for any needed clarifications.

## 2021-06-22 LAB — LIPID PANEL
Cholesterol: 113 mg/dL (ref <200)
HDL: 29 mg/dL — ABNORMAL LOW (ref >=40)
LDL Cholesterol (Calc): 60 mg/dL (calc)
Non-HDL Cholesterol (Calc): 84 mg/dL (calc) (ref <130)
Total CHOL/HDL Ratio: 3.9 (calc) (ref <5.0)
Triglycerides: 153 mg/dL — ABNORMAL HIGH (ref <150)

## 2021-06-22 LAB — CBC
HCT: 40.5 % (ref 38.5–50.0)
Hemoglobin: 13.3 g/dL (ref 13.2–17.1)
MCH: 29 pg (ref 27.0–33.0)
MCHC: 32.8 g/dL (ref 32.0–36.0)
MCV: 88.2 fL (ref 80.0–100.0)
MPV: 10.6 fL (ref 7.5–12.5)
Platelets: 253 10*3/uL (ref 140–400)
RBC: 4.59 10*6/uL (ref 4.20–5.80)
RDW: 13.2 % (ref 11.0–15.0)
WBC: 7.1 10*3/uL (ref 3.8–10.8)

## 2021-06-22 LAB — COMPLETE METABOLIC PANEL WITH GFR
AG Ratio: 2 (calc) (ref 1.0–2.5)
ALT: 18 U/L (ref 9–46)
AST: 16 U/L (ref 10–35)
Albumin: 4.2 g/dL (ref 3.6–5.1)
Alkaline phosphatase (APISO): 63 U/L (ref 35–144)
BUN: 16 mg/dL (ref 7–25)
CO2: 27 mmol/L (ref 20–32)
Calcium: 9.3 mg/dL (ref 8.6–10.3)
Chloride: 107 mmol/L (ref 98–110)
Creat: 0.97 mg/dL (ref 0.70–1.35)
Globulin: 2.1 g/dL (calc) (ref 1.9–3.7)
Glucose, Bld: 84 mg/dL (ref 65–99)
Potassium: 4.6 mmol/L (ref 3.5–5.3)
Sodium: 142 mmol/L (ref 135–146)
Total Bilirubin: 0.5 mg/dL (ref 0.2–1.2)
Total Protein: 6.3 g/dL (ref 6.1–8.1)
eGFR: 87 mL/min/{1.73_m2} (ref >=60)

## 2021-06-22 LAB — HEMOGLOBIN A1C
Hgb A1c MFr Bld: 5 % of total Hgb (ref <5.7)
Mean Plasma Glucose: 97 mg/dL
eAG (mmol/L): 5.4 mmol/L

## 2021-06-22 LAB — PSA, TOTAL WITH REFLEX TO PSA, FREE: PSA, Total: 0.4 ng/mL (ref <=4.0)

## 2021-06-26 ENCOUNTER — Other Ambulatory Visit: Payer: Self-pay

## 2021-06-26 ENCOUNTER — Ambulatory Visit (INDEPENDENT_AMBULATORY_CARE_PROVIDER_SITE_OTHER): Payer: 59 | Admitting: *Deleted

## 2021-06-26 DIAGNOSIS — I4891 Unspecified atrial fibrillation: Secondary | ICD-10-CM | POA: Diagnosis not present

## 2021-06-26 DIAGNOSIS — Z954 Presence of other heart-valve replacement: Secondary | ICD-10-CM | POA: Diagnosis not present

## 2021-06-26 DIAGNOSIS — Z5181 Encounter for therapeutic drug level monitoring: Secondary | ICD-10-CM | POA: Diagnosis not present

## 2021-06-26 LAB — POCT INR: INR: 2.8 (ref 2.0–3.0)

## 2021-06-26 NOTE — Patient Instructions (Signed)
Description   Continue taking 1 tablet daily except 1 1/2 tablets on Monday and Thursdays Recheck in 3 weeks

## 2021-06-28 NOTE — Telephone Encounter (Signed)
Pt's wife called wanting to check on the status of this refill

## 2021-07-05 ENCOUNTER — Telehealth: Payer: Self-pay

## 2021-07-05 NOTE — Telephone Encounter (Signed)
Pt's spouse called to say that pt has been out of Sildenafil 20 mg tablets for 2 weeks now. Pt has pulmonary hypertension. Medication was prescribed by Dr. Tempie Hoist, but pt & spouse are having a hard time getting a refill called in. Pt's spouse would like to know if you would refill this medication. Please advise.

## 2021-07-06 MED ORDER — SILDENAFIL CITRATE 20 MG PO TABS: 20.0000 mg | ORAL_TABLET | Freq: Three times a day (TID) | ORAL | 3 refills | Status: DC

## 2021-07-06 NOTE — Telephone Encounter (Signed)
Completed.

## 2021-07-06 NOTE — Telephone Encounter (Signed)
We can refill his sildenafil  Zandra Abts MD

## 2021-07-06 NOTE — Telephone Encounter (Signed)
Medication approved by Dr. Harl Bowie to be refilled.

## 2021-07-06 NOTE — Telephone Encounter (Signed)
Pt is needing this Fx sent to [facility]- the cost of it at [facility] is $1000 a month

## 2021-07-06 NOTE — Addendum Note (Signed)
Addended by: Christella Scheuermann C on: 07/06/2021 03:50 PM   Modules accepted: Orders

## 2021-07-25 ENCOUNTER — Ambulatory Visit (INDEPENDENT_AMBULATORY_CARE_PROVIDER_SITE_OTHER): Payer: 59 | Admitting: *Deleted

## 2021-07-25 ENCOUNTER — Other Ambulatory Visit: Payer: Self-pay

## 2021-07-25 DIAGNOSIS — Z5181 Encounter for therapeutic drug level monitoring: Secondary | ICD-10-CM | POA: Diagnosis not present

## 2021-07-25 DIAGNOSIS — Z954 Presence of other heart-valve replacement: Secondary | ICD-10-CM | POA: Diagnosis not present

## 2021-07-25 DIAGNOSIS — I4891 Unspecified atrial fibrillation: Secondary | ICD-10-CM | POA: Diagnosis not present

## 2021-07-25 LAB — POCT INR: INR: 3 (ref 2.0–3.0)

## 2021-07-25 MED ORDER — WARFARIN SODIUM 4 MG PO TABS: ORAL_TABLET | ORAL | 2 refills | Status: DC

## 2021-07-25 NOTE — Patient Instructions (Signed)
Continue taking 1 tablet daily except 1 1/2 tablets on Monday and Thursdays Recheck in 4 weeks

## 2021-08-03 ENCOUNTER — Other Ambulatory Visit: Payer: Self-pay | Admitting: Student

## 2021-08-12 ENCOUNTER — Other Ambulatory Visit: Payer: Self-pay | Admitting: Student

## 2021-08-22 ENCOUNTER — Ambulatory Visit (INDEPENDENT_AMBULATORY_CARE_PROVIDER_SITE_OTHER): Payer: 59 | Admitting: *Deleted

## 2021-08-22 DIAGNOSIS — Z954 Presence of other heart-valve replacement: Secondary | ICD-10-CM

## 2021-08-22 DIAGNOSIS — Z5181 Encounter for therapeutic drug level monitoring: Secondary | ICD-10-CM

## 2021-08-22 DIAGNOSIS — I4891 Unspecified atrial fibrillation: Secondary | ICD-10-CM

## 2021-08-22 LAB — POCT INR: INR: 3.1 — AB (ref 2.0–3.0)

## 2021-08-22 NOTE — Patient Instructions (Signed)
Continue taking 1 tablet daily except 1 1/2 tablets on Monday and Thursdays Recheck in 6 weeks

## 2021-10-03 ENCOUNTER — Ambulatory Visit (INDEPENDENT_AMBULATORY_CARE_PROVIDER_SITE_OTHER): Payer: Medicare Other | Admitting: *Deleted

## 2021-10-03 DIAGNOSIS — Z954 Presence of other heart-valve replacement: Secondary | ICD-10-CM

## 2021-10-03 DIAGNOSIS — Z5181 Encounter for therapeutic drug level monitoring: Secondary | ICD-10-CM | POA: Diagnosis not present

## 2021-10-03 DIAGNOSIS — I4891 Unspecified atrial fibrillation: Secondary | ICD-10-CM | POA: Diagnosis not present

## 2021-10-03 LAB — POCT INR: INR: 3.1 — AB (ref 2.0–3.0)

## 2021-10-03 NOTE — Patient Instructions (Signed)
Continue taking 1 tablet daily except 1 1/2 tablets on Monday and Thursdays Recheck in 6 weeks

## 2021-10-06 ENCOUNTER — Other Ambulatory Visit: Payer: Self-pay | Admitting: Physician Assistant

## 2021-10-06 NOTE — Telephone Encounter (Signed)
Prescription refill request received for warfarin [place]: 06/13/21 ([place])  Next INR check: 11/14/21 Warfarin tablet strength: 4mg   Appropriate dose and refill sent to requested pharmacy.

## 2021-10-26 ENCOUNTER — Ambulatory Visit (INDEPENDENT_AMBULATORY_CARE_PROVIDER_SITE_OTHER): Payer: Medicare Other | Admitting: Family Medicine

## 2021-10-26 ENCOUNTER — Encounter: Payer: Self-pay | Admitting: Family Medicine

## 2021-10-26 ENCOUNTER — Other Ambulatory Visit: Payer: Self-pay

## 2021-10-26 VITALS — BP 107/72 | HR 74 | Temp 99.1°F | Ht 71.0 in | Wt 270.1 lb

## 2021-10-26 DIAGNOSIS — L989 Disorder of the skin and subcutaneous tissue, unspecified: Secondary | ICD-10-CM | POA: Diagnosis not present

## 2021-10-26 DIAGNOSIS — J40 Bronchitis, not specified as acute or chronic: Secondary | ICD-10-CM

## 2021-10-26 DIAGNOSIS — J329 Chronic sinusitis, unspecified: Secondary | ICD-10-CM

## 2021-10-26 MED ORDER — AMOXICILLIN-POT CLAVULANATE 875-125 MG PO TABS: 1.0000 | ORAL_TABLET | Freq: Two times a day (BID) | ORAL | 0 refills | Status: AC

## 2021-10-26 MED ORDER — PREDNISONE 20 MG PO TABS: 40.0000 mg | ORAL_TABLET | Freq: Every day | ORAL | 0 refills | Status: AC

## 2021-10-26 NOTE — Patient Instructions (Signed)
Antibiotics and prednisone for sinobronchitis. Let us know if you don't start feeling some improvement after a few days.  Continue supportive measures - rest, hydration, humidifier use, warm compresses, warm liquids with honey and lemon, over-the-counter cough, cold, pain medicines as needed.

## 2021-10-26 NOTE — Progress Notes (Signed)
Acute Office Visit  Subjective:    Patient ID: Jeffrey Larson, male    DOB: 15-Mar-1956, 65 y.o.   MRN: [id number]  Chief Complaint  Patient presents with   Cough    congestion    HPI Patient is in today for cough.  Patient states that over a week ago he started developing sore throat, nasal congestion, productive cough, sinus pressure 7-10.  He does get slightly dyspneic/wheezy in the evenings primarily.  He is not having any fatigue, body aches, chest pain, difficulty breathing at rest during the day, nausea, vomiting, diarrhea, fevers.  Reports he had a rough recovery from [place] and does not want to let this turn into a bad infection.    Past Medical History:  Diagnosis Date   Atrial fibrillation ([place])    hx of   Cancer ([place])    skin cancer removed from arm dec 2021 melanoma   Chronic combined systolic and diastolic congestive heart failure (HCC)    COPD with chronic bronchitis (HCC)    Coronary artery disease    COVID-19 virus infection 06/2020   all symptoms resolved   Family history of breast cancer    Family history of breast cancer    Family history of lung cancer    History of kidney stones    Hyperlipidemia    Hypertension    Longstanding persistent atrial fibrillation (HCC)    Mitral stenosis with regurgitation    Myocardial infarction ([place]) 2015   stents x 2   Obesity    Personal history of colonic polyps    Pneumonia 2015 and 06-2020   Pulmonary hypertension (HCC)    S/P Maze operation for atrial fibrillation 07/18/2018   Complete bilateral atrial lesion set using bipolar radiofrequency and cryothermy ablation with clipping of LA appendage   S/P mitral valve replacement with metallic valve 7/49/4496   Sorin Carbomedics Optiform bileaflet mechanical valve, size 33 mm   S/P TVR (tricuspid valve repair) 07/18/2018   Edwards mc3 ring annuloplasty, size 30 mm   Wears partial dentures    upper and lower    Past Surgical History:  Procedure Laterality  Date   APPENDECTOMY     as a child   CARDIOVASCULAR STRESS TEST  03/2014   Borderline reversible ischemic changes at the apex.  Normal LV contractility/EF 52%.   CORONARY STENT PLACEMENT  7/12013   LEFT HEART CATHETERIZATION WITH CORONARY ANGIOGRAM N/A 06/12/2012   Procedure: LEFT HEART CATHETERIZATION WITH CORONARY ANGIOGRAM;  Surgeon: Clent Demark, MD;  Location: [place] CATH LAB;  Service: Cardiovascular;  Laterality: N/A;   MAZE N/A 07/18/2018   Procedure: MAZE;  Surgeon: Rexene Alberts, MD;  Location: [place];  Service: Open Heart Surgery;  Laterality: N/A;   MITRAL VALVE REPLACEMENT N/A 07/18/2018   Procedure: MITRAL VALVE (MV) REPLACEMENT WITH CARBOMEDICS OPTIFORM MITRAL VALVE SIZE 33.;  Surgeon: Rexene Alberts, MD;  Location: [place];  Service: Open Heart Surgery;  Laterality: N/A;   PERCUTANEOUS CORONARY STENT INTERVENTION (PCI-S) N/A 06/17/2012   Procedure: PERCUTANEOUS CORONARY STENT INTERVENTION (PCI-S);  Surgeon: Clent Demark, MD;  Location: [facility] CATH LAB;  Service: Cardiovascular;  Laterality: N/A;   RECTAL EXAM UNDER ANESTHESIA N/A 12/15/2020   Procedure: ANORECTAL EXAM UNDER ANESTHESIA EXCISION OF ANAL CANAL POLYP;  Surgeon: Ileana Roup, MD;  Location: [place];  Service: General;  Laterality: N/A;   RIGHT/LEFT HEART CATH AND CORONARY ANGIOGRAPHY N/A 06/10/2018   Procedure: RIGHT/LEFT HEART CATH AND CORONARY  ANGIOGRAPHY;  Surgeon: Dixie Dials, MD;  Location: [place] CV LAB;  Service: Cardiovascular;  Laterality: N/A;   TEE WITHOUT CARDIOVERSION N/A 06/10/2018   Procedure: TRANSESOPHAGEAL ECHOCARDIOGRAM (TEE) with bubble study;  Surgeon: Dixie Dials, MD;  Location: [facility] ENDOSCOPY;  Service: Cardiovascular;  Laterality: N/A;   TEE WITHOUT CARDIOVERSION N/A 07/18/2018   Procedure: TRANSESOPHAGEAL ECHOCARDIOGRAM (TEE);  Surgeon: Rexene Alberts, MD;  Location: [place];  Service: Open Heart Surgery;  Laterality: N/A;   TRICUSPID VALVE REPLACEMENT N/A 07/18/2018    Procedure: TRICUSPID VALVE REPAIR WITH EDWARDS MC3 TRICUSPID ANNULOPLASTY RING SIZE 30.;  Surgeon: Rexene Alberts, MD;  Location: [place];  Service: Open Heart Surgery;  Laterality: N/A;    Family History  Problem Relation Age of Onset   Heart disease Mother    Heart disease Father    Diabetes Father    Diabetes Brother    Breast cancer Maternal Aunt 30   Cancer Paternal Aunt        unk type, tumor on head   Lung cancer Cousin 64   Cancer Cousin 38   Colon cancer Neg Hx    Esophageal cancer Neg Hx    Stomach cancer Neg Hx     Social History   Socioeconomic History   Marital status: Married    Spouse name: Not on file   Number of children: Not on file   Years of education: Not on file   Highest education level: Not on file  Occupational History   Not on file  Tobacco Use   Smoking status: Former    Packs/day: 2.00    Years: 15.00    Pack years: 30.00    Types: Cigarettes    Quit date: 11/26/1996    Years since quitting: 24.9   Smokeless tobacco: Never  Vaping Use   Vaping Use: Never used  Substance and Sexual Activity   Alcohol use: No   Drug use: No   Sexual activity: Yes  Other Topics Concern   Not on file  Social History Narrative   Not on file   Social Determinants of Health   Financial Resource Strain: Not on file  Food Insecurity: Not on file  Transportation Needs: Not on file  Physical Activity: Not on file  Stress: Not on file  Social Connections: Not on file  Intimate Partner Violence: Not on file    Outpatient Medications Prior to Visit  Medication Sig Dispense Refill   acetaminophen (TYLENOL) 500 MG tablet Take 1,000 mg by mouth 2 (two) times daily.     aspirin 81 MG tablet Take 81 mg by mouth daily.     atorvastatin (LIPITOR) 40 MG tablet Take 1 tablet (40 mg total) by mouth every evening. 90 tablet 3   cyclobenzaprine (FLEXERIL) 10 MG tablet Take 0.5-1 tablets (5-10 mg total) by mouth 3 (three) times daily as needed for muscle spasms.  Caution: can cause drowsiness 60 tablet 1   ENTRESTO 24-26 MG TAKE 1 TABLET BY MOUTH TWICE DAILY 180 tablet 3   Fluticasone-Umeclidin-Vilant (TRELEGY ELLIPTA) 100-62.5-25 MCG/INH AEPB Inhale 1 puff into the lungs daily. 120 each PRN   metoprolol succinate (TOPROL-XL) 25 MG 24 hr tablet TAKE 1 TABLET(25 MG) BY MOUTH DAILY 90 tablet 3   nitroGLYCERIN (NITROSTAT) 0.4 MG SL tablet Place 1 tablet (0.4 mg total) under the tongue every 5 (five) minutes x 3 doses as needed for chest pain. 25 tablet 3   Omega-3 Fatty Acids (FISH OIL) 1200 MG CAPS Take  1,200 mg by mouth daily.     Semaglutide, 1 MG/DOSE, (OZEMPIC, 1 MG/DOSE,) 4 MG/3ML SOPN Inject 1 mg into the skin once a week. 9 mL 11   sildenafil (REVATIO) 20 MG tablet Take 1 tablet (20 mg total) by mouth 3 (three) times daily. 270 tablet 3   spironolactone (ALDACTONE) 25 MG tablet TAKE 1 TABLET(25 MG) BY MOUTH DAILY 90 tablet 3   torsemide (DEMADEX) 20 MG tablet TAKE 2 TABLET BY MOUTH EVERY DAY 60 tablet 11   warfarin (COUMADIN) 4 MG tablet TAKE 4 MG DAILY EXCEPT TAKE 6 MG ON SATURDAYS 120 tablet 2   amoxicillin (AMOXIL) 500 MG capsule Take 4 capsules by mouth 30-60 minutes prior to dental work. 4 capsule 3   No facility-administered medications prior to visit.    Allergies  Allergen Reactions   Ramipril Cough    cough   Percocet [Oxycodone-Acetaminophen]     In ICU it contributed to hallucinations. Can take by itself    Review of Systems All review of systems negative except what is listed in the HPI     Objective:    Physical Exam Vitals reviewed.  Constitutional:      Appearance: Normal appearance. He is obese.  HENT:     Head: Normocephalic and atraumatic.     Right Ear: Tympanic membrane normal.     Left Ear: Tympanic membrane normal.     Nose: Congestion present.     Mouth/Throat:     Mouth: Mucous membranes are moist.     Pharynx: Oropharynx is clear.  Eyes:     Extraocular Movements: Extraocular movements intact.      Conjunctiva/sclera: Conjunctivae normal.  Cardiovascular:     Rate and Rhythm: Normal rate and regular rhythm.  Pulmonary:     Effort: Pulmonary effort is normal.     Breath sounds: Normal breath sounds.  Musculoskeletal:     Cervical back: Normal range of motion and neck supple. No tenderness.  Lymphadenopathy:     Cervical: No cervical adenopathy.  Skin:    General: Skin is warm and dry.     Comments: < 1cm raised wart-like skin lesion to chin  Neurological:     Mental Status: He is alert and oriented to person, place, and time.  Psychiatric:        Mood and Affect: Mood normal.        Behavior: Behavior normal.        Thought Content: Thought content normal.        Judgment: Judgment normal.      BP 107/72 (BP Location: Left Arm, Patient Position: Sitting, Cuff Size: Large)   Pulse 74   Temp 99.1 F (37.3 C) (Oral)   Ht _0  (1.803 m)   Wt 270 lb 1.9 oz (122.5 kg)   SpO2 99%   BMI 37.67 kg/m  Wt Readings from Last 3 Encounters:  10/26/21 270 lb 1.9 oz (122.5 kg)  06/21/21 259 lb 1.9 oz (117.5 kg)  06/13/21 250 lb 9.6 oz (113.7 kg)    Health Maintenance Due  Topic Date Due   OPHTHALMOLOGY EXAM  06/05/2017    There are no preventive care reminders to display for this patient.   Lab Results  Component Value Date   TSH 1.30 12/08/2018   Lab Results  Component Value Date   WBC 7.1 06/21/2021   HGB 13.3 06/21/2021   HCT 40.5 06/21/2021   MCV 88.2 06/21/2021   PLT 253 06/21/2021   Lab  Results  Component Value Date   NA 142 06/21/2021   K 4.6 06/21/2021   CO2 27 06/21/2021   GLUCOSE 84 06/21/2021   BUN 16 06/21/2021   CREATININE 0.97 06/21/2021   BILITOT 0.5 06/21/2021   ALKPHOS 51 07/31/2019   AST 16 06/21/2021   ALT 18 06/21/2021   PROT 6.3 06/21/2021   ALBUMIN 3.2 (L) 07/31/2019   CALCIUM 9.3 06/21/2021   ANIONGAP 12 07/31/2019   EGFR 87 06/21/2021   Lab Results  Component Value Date   CHOL 113 06/21/2021   Lab Results  Component Value  Date   HDL 29 (L) 06/21/2021   Lab Results  Component Value Date   LDLCALC 60 06/21/2021   Lab Results  Component Value Date   TRIG 153 (H) 06/21/2021   Lab Results  Component Value Date   CHOLHDL 3.9 06/21/2021   Lab Results  Component Value Date   HGBA1C 5.0 06/21/2021       Assessment & Plan:    1. Sinobronchitis Given duration and respiratory symptoms, will go ahead and treat with prednisone burst and Augmentin. Continue supportive measures including rest, hydration, humidifier use, steam showers, warm compresses to sinuses, warm liquids with lemon and honey, and over-the-counter cough, cold, analgesics as needed.  - predniSONE (DELTASONE) 20 MG tablet; Take 2 tablets (40 mg total) by mouth daily with breakfast for 5 days.  Dispense: 10 tablet; Refill: 0 - amoxicillin-clavulanate (AUGMENTIN) 875-125 MG tablet; Take 1 tablet by mouth 2 (two) times daily for 10 days.  Dispense: 20 tablet; Refill: 0  2. Skin lesion of face Patient states he has had similar lesion frozen off before. No alarm findings on exam. Discussed with patient. Patient would like cryotherapy today - if returns, can consider biopsy/excision.  Cryotherapy: Procedure: Cryodestruction of: skin lesion/wart Consent obtained and verified.  Time-out conducted. Noted no overlying erythema, induration, or other signs of local infection. Completed without difficulty using Cryo-Gun. Advised to call if fevers/chills, erythema, induration, drainage, or persistent bleeding.   Follow-up if symptoms worsen or fail to improve.   Purcell Nails Olevia Bowens, DNP, FNP-C

## 2021-11-14 ENCOUNTER — Other Ambulatory Visit: Payer: Self-pay

## 2021-11-14 ENCOUNTER — Ambulatory Visit (INDEPENDENT_AMBULATORY_CARE_PROVIDER_SITE_OTHER): Payer: Medicare Other | Admitting: *Deleted

## 2021-11-14 DIAGNOSIS — Z954 Presence of other heart-valve replacement: Secondary | ICD-10-CM | POA: Diagnosis not present

## 2021-11-14 DIAGNOSIS — I4891 Unspecified atrial fibrillation: Secondary | ICD-10-CM

## 2021-11-14 DIAGNOSIS — Z5181 Encounter for therapeutic drug level monitoring: Secondary | ICD-10-CM

## 2021-11-14 LAB — POCT INR: INR: 1.6 — AB (ref 2.0–3.0)

## 2021-11-14 NOTE — Patient Instructions (Signed)
Take warfarin 2 tablets tonight and tomorrow night then resume 1 tablet daily except 1 1/2 tablets on Monday and Thursdays Recheck in 2 weeks

## 2021-11-28 ENCOUNTER — Ambulatory Visit (INDEPENDENT_AMBULATORY_CARE_PROVIDER_SITE_OTHER): Payer: Medicare Other | Admitting: *Deleted

## 2021-11-28 DIAGNOSIS — I4891 Unspecified atrial fibrillation: Secondary | ICD-10-CM

## 2021-11-28 DIAGNOSIS — Z5181 Encounter for therapeutic drug level monitoring: Secondary | ICD-10-CM

## 2021-11-28 DIAGNOSIS — Z954 Presence of other heart-valve replacement: Secondary | ICD-10-CM

## 2021-11-28 LAB — POCT INR: INR: 4.1 — AB (ref 2.0–3.0)

## 2021-11-28 NOTE — Patient Instructions (Signed)
Hold warfarin tonight then resume 1 tablet daily except 1 1/2 tablets on Monday and Thursdays Recheck in 3 weeks

## 2021-12-19 ENCOUNTER — Ambulatory Visit: Payer: Medicare Other | Admitting: Cardiology

## 2021-12-19 ENCOUNTER — Encounter: Payer: Self-pay | Admitting: Cardiology

## 2021-12-19 ENCOUNTER — Ambulatory Visit (INDEPENDENT_AMBULATORY_CARE_PROVIDER_SITE_OTHER): Payer: Medicare Other | Admitting: *Deleted

## 2021-12-19 ENCOUNTER — Other Ambulatory Visit: Payer: Self-pay

## 2021-12-19 VITALS — BP 110/70 | HR 79 | Ht 71.0 in | Wt 273.4 lb

## 2021-12-19 DIAGNOSIS — I4891 Unspecified atrial fibrillation: Secondary | ICD-10-CM

## 2021-12-19 DIAGNOSIS — E782 Mixed hyperlipidemia: Secondary | ICD-10-CM | POA: Diagnosis not present

## 2021-12-19 DIAGNOSIS — Z5181 Encounter for therapeutic drug level monitoring: Secondary | ICD-10-CM

## 2021-12-19 DIAGNOSIS — Z954 Presence of other heart-valve replacement: Secondary | ICD-10-CM

## 2021-12-19 DIAGNOSIS — I5032 Chronic diastolic (congestive) heart failure: Secondary | ICD-10-CM

## 2021-12-19 DIAGNOSIS — D6869 Other thrombophilia: Secondary | ICD-10-CM

## 2021-12-19 DIAGNOSIS — I2510 Atherosclerotic heart disease of native coronary artery without angina pectoris: Secondary | ICD-10-CM

## 2021-12-19 LAB — POCT INR: INR: 3 (ref 2.0–3.0)

## 2021-12-19 NOTE — Progress Notes (Signed)
Clinical Summary Jeffrey Larson is a 66 y.o.male seen today for follow up of the following medical problems.      1. History of MVR for rheumatic mitral stenosis - On 07/18/2018 he underwent mitral valve replacement with mechanical mitral valve, tricuspid valve repair using an Edwards mc3 ring annuloplasty , and Maze procedure -Sorin Carbomedics Optiform bileaflet 33 mm mechanical valve on 07/18/2018.    - Jan 2020 echo LVEF 55-60%, normal MVR     No SOB/DOE, no LE edema - no bleeding on coumadin.      2. Permanent afib -no palpitatoins.    3. CAD - MI in 2013 with stenting   - He underwent cardiac catheterization on 06/10/2018.  He had nonobstructive disease with a mid RCA 50% stenosis and otherwise 20% stenosis seen in the proximal left circumflex and ostial to proximal LAD. - denies any chest pain.   4. Chronic diastolic HF - taking torsemide 20mg  daily, denies any LE edema  - had some prior LV dysfunction, 05/2018 TEE LVEF 35-40% - Jan 2020 echo 55-60%     5. COPD     6. COVID in 06/2019   7. Pulmonary HTN - has been on sildenafil per CHF clinic   8. HTN - he remains compliant with med   9. Hyperlipidemia - LDL has been at goal, was 61 in 05/2020  - lbas followed by pcp, he is on atorvastatin 40mg  daily  05/2021 TC 113 TG 153 HDL 29 LDL 60  Past Medical History:  Diagnosis Date   Atrial fibrillation (HCC)    hx of   Cancer ([place])    skin cancer removed from arm dec 2021 melanoma   Chronic combined systolic and diastolic congestive heart failure (HCC)    COPD with chronic bronchitis ([place])    Coronary artery disease    COVID-19 virus infection 06/2020   all symptoms resolved   Family history of breast cancer    Family history of breast cancer    Family history of lung cancer    History of kidney stones    Hyperlipidemia    Hypertension    Longstanding persistent atrial fibrillation (HCC)    Mitral stenosis with regurgitation    Myocardial infarction  ([place]) 2015   stents x 2   Obesity    Personal history of colonic polyps    Pneumonia 2015 and 06-2020   Pulmonary hypertension (HCC)    S/P Maze operation for atrial fibrillation 07/18/2018   Complete bilateral atrial lesion set using bipolar radiofrequency and cryothermy ablation with clipping of LA appendage   S/P mitral valve replacement with metallic valve 0/11/7492   Sorin Carbomedics Optiform bileaflet mechanical valve, size 33 mm   S/P TVR (tricuspid valve repair) 07/18/2018   Edwards mc3 ring annuloplasty, size 30 mm   Wears partial dentures    upper and lower     Allergies  Allergen Reactions   Ramipril Cough    cough   Percocet [Oxycodone-Acetaminophen]     In ICU it contributed to hallucinations. Can take by itself     Current Outpatient Medications  Medication Sig Dispense Refill   acetaminophen (TYLENOL) 500 MG tablet Take 1,000 mg by mouth 2 (two) times daily.     aspirin 81 MG tablet Take 81 mg by mouth daily.     atorvastatin (LIPITOR) 40 MG tablet Take 1 tablet (40 mg total) by mouth every evening. 90 tablet 3   cyclobenzaprine (FLEXERIL) 10 MG tablet Take  0.5-1 tablets (5-10 mg total) by mouth 3 (three) times daily as needed for muscle spasms. Caution: can cause drowsiness 60 tablet 1   ENTRESTO 24-26 MG TAKE 1 TABLET BY MOUTH TWICE DAILY 180 tablet 3   Fluticasone-Umeclidin-Vilant (TRELEGY ELLIPTA) 100-62.5-25 MCG/INH AEPB Inhale 1 puff into the lungs daily. 120 each PRN   metoprolol succinate (TOPROL-XL) 25 MG 24 hr tablet TAKE 1 TABLET(25 MG) BY MOUTH DAILY 90 tablet 3   nitroGLYCERIN (NITROSTAT) 0.4 MG SL tablet Place 1 tablet (0.4 mg total) under the tongue every 5 (five) minutes x 3 doses as needed for chest pain. 25 tablet 3   Omega-3 Fatty Acids (FISH OIL) 1200 MG CAPS Take 1,200 mg by mouth daily.     Semaglutide, 1 MG/DOSE, (OZEMPIC, 1 MG/DOSE,) 4 MG/3ML SOPN Inject 1 mg into the skin once a week. 9 mL 11   sildenafil (REVATIO) 20 MG tablet Take 1 tablet  (20 mg total) by mouth 3 (three) times daily. 270 tablet 3   spironolactone (ALDACTONE) 25 MG tablet TAKE 1 TABLET(25 MG) BY MOUTH DAILY 90 tablet 3   torsemide (DEMADEX) 20 MG tablet TAKE 2 TABLET BY MOUTH EVERY DAY 60 tablet 11   warfarin (COUMADIN) 4 MG tablet TAKE 4 MG DAILY EXCEPT TAKE 6 MG ON SATURDAYS 120 tablet 2   No current facility-administered medications for this visit.     Past Surgical History:  Procedure Laterality Date   APPENDECTOMY     as a child   CARDIOVASCULAR STRESS TEST  03/2014   Borderline reversible ischemic changes at the apex.  Normal LV contractility/EF 52%.   CORONARY STENT PLACEMENT  7/12013   LEFT HEART CATHETERIZATION WITH CORONARY ANGIOGRAM N/A 06/12/2012   Procedure: LEFT HEART CATHETERIZATION WITH CORONARY ANGIOGRAM;  Surgeon: Clent Demark, MD;  Location: [place] CATH LAB;  Service: Cardiovascular;  Laterality: N/A;   MAZE N/A 07/18/2018   Procedure: MAZE;  Surgeon: Rexene Alberts, MD;  Location: [place];  Service: Open Heart Surgery;  Laterality: N/A;   MITRAL VALVE REPLACEMENT N/A 07/18/2018   Procedure: MITRAL VALVE (MV) REPLACEMENT WITH CARBOMEDICS OPTIFORM MITRAL VALVE SIZE 33.;  Surgeon: Rexene Alberts, MD;  Location: [place];  Service: Open Heart Surgery;  Laterality: N/A;   PERCUTANEOUS CORONARY STENT INTERVENTION (PCI-S) N/A 06/17/2012   Procedure: PERCUTANEOUS CORONARY STENT INTERVENTION (PCI-S);  Surgeon: Clent Demark, MD;  Location: [facility] CATH LAB;  Service: Cardiovascular;  Laterality: N/A;   RECTAL EXAM UNDER ANESTHESIA N/A 12/15/2020   Procedure: ANORECTAL EXAM UNDER ANESTHESIA EXCISION OF ANAL CANAL POLYP;  Surgeon: Ileana Roup, MD;  Location: [place];  Service: General;  Laterality: N/A;   RIGHT/LEFT HEART CATH AND CORONARY ANGIOGRAPHY N/A 06/10/2018   Procedure: RIGHT/LEFT HEART CATH AND CORONARY ANGIOGRAPHY;  Surgeon: Dixie Dials, MD;  Location: [place] CV LAB;  Service: Cardiovascular;  Laterality: N/A;    TEE WITHOUT CARDIOVERSION N/A 06/10/2018   Procedure: TRANSESOPHAGEAL ECHOCARDIOGRAM (TEE) with bubble study;  Surgeon: Dixie Dials, MD;  Location: [facility] ENDOSCOPY;  Service: Cardiovascular;  Laterality: N/A;   TEE WITHOUT CARDIOVERSION N/A 07/18/2018   Procedure: TRANSESOPHAGEAL ECHOCARDIOGRAM (TEE);  Surgeon: Rexene Alberts, MD;  Location: [place];  Service: Open Heart Surgery;  Laterality: N/A;   TRICUSPID VALVE REPLACEMENT N/A 07/18/2018   Procedure: TRICUSPID VALVE REPAIR WITH EDWARDS MC3 TRICUSPID ANNULOPLASTY RING SIZE 30.;  Surgeon: Rexene Alberts, MD;  Location: [place];  Service: Open Heart Surgery;  Laterality: N/A;  Allergies  Allergen Reactions   Ramipril Cough    cough   Percocet [Oxycodone-Acetaminophen]     In ICU it contributed to hallucinations. Can take by itself      Family History  Problem Relation Age of Onset   Heart disease Mother    Heart disease Father    Diabetes Father    Diabetes Brother    Breast cancer Maternal Aunt 57   Cancer Paternal Aunt        unk type, tumor on head   Lung cancer Cousin 53   Cancer Cousin 38   Colon cancer Neg Hx    Esophageal cancer Neg Hx    Stomach cancer Neg Hx      Social History Jeffrey Larson reports that he quit smoking about 25 years ago. His smoking use included cigarettes. He has a 30.00 pack-year smoking history. He has never used smokeless tobacco. Jeffrey Larson reports no history of alcohol use.   Review of Systems CONSTITUTIONAL: No weight loss, fever, chills, weakness or fatigue.  HEENT: Eyes: No visual loss, blurred vision, double vision or yellow sclerae.No hearing loss, sneezing, congestion, runny nose or sore throat.  SKIN: No rash or itching.  CARDIOVASCULAR: per hpi RESPIRATORY: No shortness of breath, cough or sputum.  GASTROINTESTINAL: No anorexia, nausea, vomiting or diarrhea. No abdominal pain or blood.  GENITOURINARY: No burning on urination, no polyuria NEUROLOGICAL: No headache, dizziness,  syncope, paralysis, ataxia, numbness or tingling in the extremities. No change in bowel or bladder control.  MUSCULOSKELETAL: No muscle, back pain, joint pain or stiffness.  LYMPHATICS: No enlarged nodes. No history of splenectomy.  PSYCHIATRIC: No history of depression or anxiety.  ENDOCRINOLOGIC: No reports of sweating, cold or heat intolerance. No polyuria or polydipsia.  Marland Kitchen   Physical Examination Today's Vitals   12/19/21 0823  BP: 110/70  Pulse: 79  SpO2: 98%  Weight: 273 lb 6.4 oz (124 kg)  Height: 5\' 11"  (1.803 m)   Body mass index is 38.13 kg/m.  Gen: resting comfortably, no acute distress HEENT: no scleral icterus, pupils equal round and reactive, no palptable cervical adenopathy,  CV: irreg, mechanical S1, no m/rg, no jvd Resp: Clear to auscultation bilaterally GI: abdomen is soft, non-tender, non-distended, normal bowel sounds, no hepatosplenomegaly MSK: extremities are warm, no edema.  Skin: warm, no rash Neuro:  no focal deficits Psych: appropriate affect   Diagnostic Studies     Assessment and Plan  1. History of MV replacment/mechnical MV -doing well without new symptoms, continue to monitor - on coumadin and ASA in setting of mechanical valve     2. Afib/acquired thrombophilia - no symptoms, continue currentmeds - on coumadin in setting of afib and mechanical MV   3. Hyperlipidemia - lipids are at goal, continue current meds   4. CAD - no symptoms, continue current meds   5. Chronic diastolic - he is euvolemic without symptoms, continue current diuretic.    F/u 6 months   Arnoldo Lenis, M.D.

## 2021-12-19 NOTE — Patient Instructions (Addendum)

## 2021-12-19 NOTE — Patient Instructions (Signed)
Continue warfarin 1 tablet daily except 1 1/2 tablets on Monday and Thursdays Recheck in 4 weeks

## 2021-12-29 ENCOUNTER — Other Ambulatory Visit: Payer: Self-pay | Admitting: Osteopathic Medicine

## 2022-01-16 ENCOUNTER — Ambulatory Visit: Payer: Medicare Other | Admitting: *Deleted

## 2022-01-16 DIAGNOSIS — Z5181 Encounter for therapeutic drug level monitoring: Secondary | ICD-10-CM

## 2022-01-16 DIAGNOSIS — I4891 Unspecified atrial fibrillation: Secondary | ICD-10-CM

## 2022-01-16 DIAGNOSIS — Z954 Presence of other heart-valve replacement: Secondary | ICD-10-CM

## 2022-01-16 LAB — POCT INR: INR: 2.4 (ref 2.0–3.0)

## 2022-01-16 NOTE — Patient Instructions (Signed)
Take warfarin 2 tablets tonight then resume 1 tablet daily except 1 1/2 tablets on Monday and Thursdays Recheck in 4 weeks

## 2022-02-02 ENCOUNTER — Telehealth: Payer: Self-pay

## 2022-02-02 NOTE — Telephone Encounter (Signed)
I have called and left a voicemail for patient regarding recall colonoscopy. Patient will need an office visit due to blood thinners.  ?

## 2022-02-13 ENCOUNTER — Ambulatory Visit (INDEPENDENT_AMBULATORY_CARE_PROVIDER_SITE_OTHER): Payer: Medicare Other | Admitting: *Deleted

## 2022-02-13 DIAGNOSIS — Z5181 Encounter for therapeutic drug level monitoring: Secondary | ICD-10-CM

## 2022-02-13 DIAGNOSIS — Z954 Presence of other heart-valve replacement: Secondary | ICD-10-CM

## 2022-02-13 DIAGNOSIS — I4891 Unspecified atrial fibrillation: Secondary | ICD-10-CM

## 2022-02-13 LAB — POCT INR: INR: 3.3 — AB (ref 2.0–3.0)

## 2022-02-13 NOTE — Patient Instructions (Signed)
Continue warfarin 1 tablet daily except 1 1/2 tablets on Monday and Thursdays ?Recheck in 4 weeks ?

## 2022-02-14 ENCOUNTER — Ambulatory Visit: Payer: Medicare Other | Admitting: Family Medicine

## 2022-02-16 ENCOUNTER — Other Ambulatory Visit: Payer: Self-pay

## 2022-02-16 ENCOUNTER — Ambulatory Visit (INDEPENDENT_AMBULATORY_CARE_PROVIDER_SITE_OTHER): Payer: Medicare Other | Admitting: Physician Assistant

## 2022-02-16 ENCOUNTER — Encounter: Payer: Self-pay | Admitting: Physician Assistant

## 2022-02-16 VITALS — BP 104/69 | HR 65 | Resp 20 | Ht 71.0 in | Wt 273.1 lb

## 2022-02-16 DIAGNOSIS — Z23 Encounter for immunization: Secondary | ICD-10-CM

## 2022-02-16 DIAGNOSIS — L821 Other seborrheic keratosis: Secondary | ICD-10-CM

## 2022-02-16 DIAGNOSIS — L570 Actinic keratosis: Secondary | ICD-10-CM | POA: Diagnosis not present

## 2022-02-16 DIAGNOSIS — L820 Inflamed seborrheic keratosis: Secondary | ICD-10-CM

## 2022-02-16 NOTE — Patient Instructions (Signed)
Actinic Keratosis °An actinic keratosis is a precancerous growth on the skin. If there is more than one growth, the condition is called actinic keratoses. Actinic keratoses appear most often on areas of skin that get a lot of sun exposure, including the scalp, face, ears, lips, upper back, forearms, and the backs of the hands. °If left untreated, these growths may develop into a skin cancer called squamous cell carcinoma. It is important to have all these growths checked by a health care provider to determine the best treatment approach. °What are the causes? °Actinic keratoses are caused by getting too much ultraviolet (UV) radiation from the sun or other UV light sources. °What increases the risk? °You are more likely to develop this condition if you: °Have light-colored skin and blue eyes. °Have blond or red hair. °Spend a lot of time in the sun. °Do not protect your skin from the sun when outdoors. °Are an older person. The risk of developing an actinic keratosis increases with age. °What are the signs or symptoms? °Actinic keratoses feel like scaly, rough spots of skin. Symptoms of this condition include growths that may: °Be as small as a pinhead or as big as a quarter. °Itch, hurt, or feel sensitive. °Be skin-colored, light tan, dark tan, pink, or a combination of any of these colors. In most cases, the growths become red. °Have a small piece of pink or gray skin (skin tag) growing from them. °It may be easier to notice actinic keratoses by feeling them, rather than seeing them. Sometimes, actinic keratoses disappear, but many reappear a few days to a few weeks later. °How is this diagnosed? °This condition is usually diagnosed with a physical exam. °A tissue sample may be removed from the actinic keratosis and examined under a microscope (biopsy). °How is this treated? °If needed, this condition may be treated by: °Scraping off the actinic keratosis (curettage). °Freezing the actinic keratosis with liquid  nitrogen (cryosurgery). This causes the growth to eventually fall off the skin. °Applying medicated creams or gels to destroy the cells in the growth. °Applying chemicals to the actinic keratosis to make the outer layers of skin peel off (chemical peel). °Using photodynamic therapy. In this procedure, medicated cream is applied to the actinic keratosis. This cream increases your skin's sensitivity to light. Then, a strong light is aimed at the actinic keratosis to destroy cells in the growth. °Follow these instructions at home: °Skin care °Apply cool, wet cloths (cool compresses) to the affected areas. °Do not scratch your skin. °Check your skin regularly for any growths, especially growths that: °Start to itch or bleed. °Change in size, shape, or color. °Caring for the treated area °Keep the treated area clean and dry as told by your health care provider. °Do not apply any medicine, cream, or lotion to the treated area unless your health care provider tells you to do that. °Do not pick at blisters or try to break them open. This can cause infection and scarring. °If you have red or irritated skin after treatment, follow instructions from your health care provider about how to take care of the treated area. Make sure you: °Wash your hands with soap and water before you change your bandage (dressing). If soap and water are not available, use hand sanitizer. °Change your dressing as told by your health care provider. °If you have red or irritated skin after treatment, check your treated area every day for signs of infection. Check for: °Redness, swelling, or pain. °Fluid or blood. °  Warmth. °Pus or a bad smell. °General instructions °Take or apply over-the-counter and prescription medicines only as told by your health care provider. °Return to your normal activities as told by your health care provider. Ask your health care provider what activities are safe for you. °Have a skin exam done every year by a health care  provider who is a skin specialist (dermatologist). °Keep all follow-up visits as told by your health care provider. This is important. °Lifestyle °Do not use any products that contain nicotine or tobacco, such as cigarettes and e-cigarettes. If you need help quitting, ask your health care provider. °Take steps to protect your skin from the sun. °Try to avoid the sun between 10:00 a.m. and 4:00 p.m. This is when the UV light is the strongest. °Use a sunscreen or sunblock with SPF 30 (sun protection factor 30) or greater. °Apply sunscreen before you are exposed to sunlight and reapply as often as directed by the instructions on the sunscreen container. °Always wear sunglasses that have UV protection, and always wear a hat and clothing to protect your skin from sunlight. °When possible, avoid medicines that increase your sensitivity to sunlight. °Do not use tanning beds or other indoor tanning devices. °Contact a health care provider if: °You notice any changes or new growths on your skin. °You have swelling, pain, or more redness around your treated area. °You have fluid or blood coming from your treated area. °Your treated area feels warm to the touch. °You have pus or a bad smell coming from your treated area. °You have a fever. °You have a blister that becomes large and painful. °Summary °An actinic keratosis is a precancerous growth on the skin. If there is more than one growth, the condition is called actinic keratoses. In some cases, if left untreated, these growths can develop into skin cancer. °Check your skin regularly for any growths, especially growths that start to itch or bleed, or change in size, shape, or color. °Take steps to protect your skin from the sun. °Contact a health care provider if you notice any changes or new growths on your skin. °Keep all follow-up visits as told by your health care provider. This is important. °This information is not intended to replace advice given to you by your  health care provider. Make sure you discuss any questions you have with your health care provider. °Document Revised: 03/25/2018 Document Reviewed: 03/25/2018 °Elsevier Patient Education © 2022 [employer]vier Inc. ° °

## 2022-02-19 ENCOUNTER — Encounter: Payer: Self-pay | Admitting: Physician Assistant

## 2022-02-19 DIAGNOSIS — L820 Inflamed seborrheic keratosis: Secondary | ICD-10-CM | POA: Insufficient documentation

## 2022-02-19 DIAGNOSIS — L570 Actinic keratosis: Secondary | ICD-10-CM | POA: Insufficient documentation

## 2022-02-19 NOTE — Progress Notes (Signed)
? ?Subjective:  ? ? Patient ID: Jeffrey Larson, male    DOB: 07-May-1956, 66 y.o.   MRN: [id number] ? ?HPI ?Pt is a 66 yo male who presents to the clinic to have some skin lesion frozen off like he has had before.  ? ?Active Ambulatory Problems  ?  Diagnosis Date Noted  ? History of MI (myocardial infarction) 08/04/2014  ? Hyperlipidemia 08/04/2014  ? Essential hypertension, benign 08/04/2014  ? COPD with chronic bronchitis ([place]) 09/08/2014  ? Bilateral low back pain without sciatica 04/30/2016  ? Hematuria, microscopic 04/30/2016  ? Chronic combined systolic and diastolic heart failure ([place]) 11/12/2016  ? Acute respiratory failure with hypoxia ([place])   ? Longstanding persistent atrial fibrillation ([place])   ? Cardiac disease 04/18/2017  ? Controlled type 2 diabetes mellitus without complication, without long-term current use of insulin ([place]) 08/02/2017  ? Coronary artery disease   ? Morbid obesity ([place])   ? Pulmonary hypertension ([place])   ? Tricuspid valve insufficiency 07/15/2018  ? S/P mitral valve replacement with metallic valve  77/82/4235  ? S/P TVR (tricuspid valve repair) 07/18/2018  ? S/P Maze operation for atrial fibrillation 07/18/2018  ? Pressure injury of skin 07/30/2018  ? Encounter for therapeutic drug monitoring 08/04/2018  ? 2019 novel coronavirus disease (COVID-19) 07/08/2019  ? COVID-19 virus infection 07/08/2019  ? Enthesopathy of ankle 05/31/2020  ? Family history of lung cancer   ? Personal history of colonic polyps   ? Family history of breast cancer   ? ?Resolved Ambulatory Problems  ?  Diagnosis Date Noted  ? CAP (community acquired pneumonia) 08/04/2014  ? Atrial fibrillation with RVR ([place]) 08/04/2014  ? Mitral stenosis with regurgitation   ? Right shoulder injury 07/20/2015  ? Fracture of acromion of scapula 07/20/2015  ? Acute exacerbation of chronic obstructive pulmonary disease (COPD) ([place]) 12/22/2015  ? Medication monitoring encounter 04/30/2016  ? Non-rheumatic mitral valve stenosis  06/10/2018  ? Chronic combined systolic and diastolic congestive heart failure ([place])   ? Persistent atrial fibrillation 07/15/2018  ? ?Past Medical History:  ?Diagnosis Date  ? Atrial fibrillation ([place])   ? Cancer [facility])   ? History of kidney stones   ? Hypertension   ? Myocardial infarction [facility]) 2015  ? Obesity   ? Pneumonia 2015 and 06-2020  ? Wears partial dentures   ? ? ? ?Review of Systems ? ?  See HPI.  ?Objective:  ? Physical Exam ? ?Inflamed SK(wart like raised papule) of chin ? ?Multiple scaly lesions on erythematous base over jaw line and bilateral hands and forearms.  ? ?Cryotherapy Procedure Note ? ?Pre-operative Diagnosis: Actinic keratosis and inflamed SK ? ?Post-operative Diagnosis: Actinic keratosis and inflamed SK ? ?Locations:  lower chin, jaw line, bilateral arms and hands ? ?Indications: pre-cancerous ? ?Procedure Details  ?History of allergy to iodine: no. ?Pacemaker? no. ? ?Patient informed of risks (permanent scarring, infection, light or dark discoloration, bleeding, infection, weakness, numbness and recurrence of the lesion) and benefits of the procedure and verbal informed consent obtained. ? ?The areas are treated with liquid nitrogen therapy, frozen until ice ball extended 2 mm beyond lesion, allowed to thaw, and treated again. The patient tolerated procedure well.  The patient was instructed on post-op care, warned that there may be blister formation, redness and pain. Recommend OTC analgesia as needed for pain. ? ?Condition: ?Stable ? ?Complications: ?none. ? ?Plan: ?1. Instructed to keep the area dry and covered for 24-48h and clean thereafter. ?2. Warning signs  of infection were reviewed.   ?3. Recommended that the patient use OTC acetaminophen as needed for pain.  ? ? ? ? ? ?   ?Assessment & Plan:  ?..[place] was seen today for skin problem. ? ?Diagnoses and all orders for this visit: ? ?Actinic keratoses ? ?Needs flu shot ?-     Flu Vaccine QUAD High Dose(Fluad) ? ?Inflamed seborrheic  keratosis ? ? ?Cryotherapy done to AKs and SKs. Follow up as needed in 2 weeks for more treatment.  ?Needs to establish with new PCP.  ? ?

## 2022-02-22 ENCOUNTER — Ambulatory Visit (INDEPENDENT_AMBULATORY_CARE_PROVIDER_SITE_OTHER): Payer: Medicare Other | Admitting: Gastroenterology

## 2022-02-22 ENCOUNTER — Encounter: Payer: Self-pay | Admitting: Gastroenterology

## 2022-02-22 ENCOUNTER — Ambulatory Visit: Payer: Medicare Other | Admitting: Gastroenterology

## 2022-02-22 VITALS — BP 118/80 | HR 75 | Ht 71.0 in | Wt 271.0 lb

## 2022-02-22 DIAGNOSIS — Z860100 Personal history of colon polyps, unspecified: Secondary | ICD-10-CM

## 2022-02-22 DIAGNOSIS — E66812 Obesity, class 2: Secondary | ICD-10-CM

## 2022-02-22 DIAGNOSIS — K6282 Dysplasia of anus: Secondary | ICD-10-CM | POA: Diagnosis not present

## 2022-02-22 DIAGNOSIS — I4811 Longstanding persistent atrial fibrillation: Secondary | ICD-10-CM | POA: Diagnosis not present

## 2022-02-22 DIAGNOSIS — Z6836 Body mass index (BMI) 36.0-36.9, adult: Secondary | ICD-10-CM

## 2022-02-22 DIAGNOSIS — Z7901 Long term (current) use of anticoagulants: Secondary | ICD-10-CM

## 2022-02-22 DIAGNOSIS — Z954 Presence of other heart-valve replacement: Secondary | ICD-10-CM

## 2022-02-22 MED ORDER — CLENPIQ 10-3.5-12 MG-GM -GM/160ML PO SOLN: 1.0000 | ORAL | 0 refills | Status: DC

## 2022-02-22 NOTE — Progress Notes (Signed)
? ?Chief Complaint:    Colon polyps ? ?GI History:  Jeffrey Larson is a 66 y.o. male with a history of A. fib (s/p Maze 2019, on Coumadin), tricuspid valve repair 2019, COPD, CAD s/p PCI, history of Covid, HLD, CHF (EF 55-60% 11/2018), mild OSA (not requiring CPAP), mitral stenosis with regurgitation s/p mitral valve replacement with metallic valve 1914, obesity (BMI 37), colon polyps, diverticulosis, AIN-1, squamous cell skin cancer. ? ?-07/25/2020: Positive Cologuard.  Interestingly, INR was supratherapeutic at 4.1 at that time. ?- 09/15/2020: Initial appointment in GI clinic.  No prior colonoscopy. ?- 11/01/2020: Colonoscopy: 16 subcentimeter polyps scattered throughout the colon (path: Tubular adenomas), 12 mm distal sigmoid pedunculated adenoma, sigmoid diverticulosis.  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  Recommended 1 year repeat ?- 12/15/2020: Surgical excision of AIN-1 by Dr. Dema Severin ?- 12/20/2020: Evaluation in the [facility].  Never completed genetic testing due to cost; hasn't followed-up ? ?HPI:   ? ? ?Patient is a 66 y.o. male presenting to the Gastroenterology Clinic for follow-up and to discuss repeat colonoscopy for ongoing polyp surveillance.  He is otherwise without active GI symptoms.  History of multiple adenomas on index colonoscopy in 10/2020 as outlined above.  Was also diagnosed with AIN-1 at that time, now s/p surgical excision by Dr. Dema Severin in 11/2020.  Has not yet followed up with Dr. Dema Severin. ? ? ?Review of systems:     No chest pain, no SOB, no fevers, no urinary sx  ? ?Past Medical History:  ?Diagnosis Date  ? Atrial fibrillation ([place])   ? hx of  ? Cancer [facility])   ? skin cancer removed from arm dec 2021 melanoma  ? Chronic combined systolic and diastolic congestive heart failure ([place])   ? COPD with chronic bronchitis ([place])   ? Coronary artery disease   ? COVID-19 virus infection 06/2020  ? all symptoms resolved  ? Family history of breast cancer   ? Family history of  breast cancer   ? Family history of lung cancer   ? History of kidney stones   ? Hyperlipidemia   ? Hypertension   ? Longstanding persistent atrial fibrillation ([place])   ? Mitral stenosis with regurgitation   ? Myocardial infarction [facility]) 2015  ? stents x 2  ? Obesity   ? Personal history of colonic polyps   ? Pneumonia 2015 and 06-2020  ? Pulmonary hypertension ([place])   ? S/P Maze operation for atrial fibrillation 07/18/2018  ? Complete bilateral atrial lesion set using bipolar radiofrequency and cryothermy ablation with clipping of LA appendage  ? S/P mitral valve replacement with metallic valve 7/82/9562  ? Sorin Carbomedics Optiform bileaflet mechanical valve, size 33 mm  ? S/P TVR (tricuspid valve repair) 07/18/2018  ? Edwards mc3 ring annuloplasty, size 30 mm  ? Wears partial dentures   ? upper and lower  ? ? ?Patient's surgical history, family medical history, social history, medications and allergies were all reviewed in Epic  ? ? ?Current Outpatient Medications  ?Medication Sig Dispense Refill  ? acetaminophen (TYLENOL) 500 MG tablet Take 1,000 mg by mouth 2 (two) times daily.    ? aspirin 81 MG tablet Take 81 mg by mouth daily.    ? atorvastatin (LIPITOR) 40 MG tablet Take 1 tablet (40 mg total) by mouth every evening. 90 tablet 3  ? ENTRESTO 24-26 MG TAKE 1 TABLET BY MOUTH TWICE DAILY 180 tablet 3  ? metoprolol succinate (TOPROL-XL) 25 MG 24 hr tablet TAKE 1  TABLET(25 MG) BY MOUTH DAILY 90 tablet 3  ? nitroGLYCERIN (NITROSTAT) 0.4 MG SL tablet Place 1 tablet (0.4 mg total) under the tongue every 5 (five) minutes x 3 doses as needed for chest pain. 25 tablet 3  ? Semaglutide, 1 MG/DOSE, (OZEMPIC, 1 MG/DOSE,) 4 MG/3ML SOPN Inject 1 mg into the skin once a week. 9 mL 11  ? sildenafil (REVATIO) 20 MG tablet Take 1 tablet (20 mg total) by mouth 3 (three) times daily. 270 tablet 3  ? torsemide (DEMADEX) 20 MG tablet TAKE 2 TABLET BY MOUTH EVERY DAY 60 tablet 11  ? warfarin (COUMADIN) 4 MG tablet TAKE 4 MG DAILY  EXCEPT TAKE 6 MG ON SATURDAYS 120 tablet 2  ? ?No current facility-administered medications for this visit.  ? ? ?Physical Exam:   ? ? ?BP 118/80   Pulse 75   Ht '5\' 11"'$  (1.803 m)   Wt 271 lb (122.9 kg)   SpO2 98%   BMI 37.80 kg/m?  ? ?GENERAL:  Pleasant male in NAD ?PSYCH: : Cooperative, normal affect ?EENT:  conjunctiva pink, mucous membranes moist, neck supple without masses ?CARDIAC:  RRR, mechanical "click", no peripheral edema ?PULM: Normal respiratory effort, lungs CTA bilaterally, no wheezing ?ABDOMEN:  Nondistended, soft, nontender.  ?SKIN:  turgor, no lesions seen ?Musculoskeletal:  Normal muscle tone, normal strength ?NEURO: Alert and oriented x 3, no focal neurologic deficits ? ? ?IMPRESSION and PLAN:   ? ?1) History of colon polyps ?- Repeat colonoscopy now for ongoing short interval surveillance ?- Was seen in the [facility], but was unable to complete genetics panel due to high cost ?- Plan for Coumadin hold with Lovenox bridge in the perioperative setting as below ? ?2) AIN-1 ?- Diagnosed at time of index colonoscopy, now s/p surgical excision 11/2018 by Dr. Dema Severin ?- Will evaluate for resolution at time of colonoscopy as above ?- To follow-up with Dr. Dema Severin after colonoscopy as previously recommended ? ?3) Atrial fibrillation ?4) Mechanical mitral valve ?5) Tricuspid valve repair ?6) CHF (EF 55-60%) ?7) Systemic anticoagulation (Coumadin) ?- Hold Coumadin 5 days before procedure with plan for Lovenox bridge. Low but real risk of cardiovascular event such as heart attack, stroke, embolism, thrombosis or ischemia/infarct of other organs off Coumadin, but medicating with Lovenox bridge.  Explained and need to seek urgent help if this occurs. The patient consents to proceed. Will communicate by phone or EMR with patient's Cardiology Clinic and Coumadin Clinic providers for preoperative clearance and set up Coumadin hold with Lovenox bridge. ?- Will obtain Cardiology clearance to proceed with  colonoscopy in [place] as planned ? ? ?The indications, risks, and benefits of colonoscopy were explained to the patient in detail. Risks include but are not limited to bleeding, perforation, adverse reaction to medications, and cardiopulmonary compromise. Sequelae include but are not limited to the possibility of surgery, hospitalization, and mortality. The patient verbalized understanding and wished to proceed. All questions answered, referred to the scheduler and bowel prep ordered. Further recommendations pending results of the exam.  ? ?    ?    ? ?Lavena Bullion ,DO, FACG 02/22/2022, 1:42 PM ? ?

## 2022-02-22 NOTE — Patient Instructions (Signed)
If you are age 66 or older, your body mass index should be between 23-30. Your Body mass index is 37.8 kg/m?Marland Kitchen If this is out of the aforementioned range listed, please consider follow up with your Primary Care Provider. ? ?If you are age 66 or younger, your body mass index should be between 19-25. Your Body mass index is 37.8 kg/m?Marland Kitchen If this is out of the aformentioned range listed, please consider follow up with your Primary Care Provider.  ? ?__________________________________________________________ ? ?The [facility] [place] [facility] providers would like to encourage you to use [facility] to communicate with providers for non-urgent requests or questions.  Due to long hold times on the telephone, sending your provider a message by [facility] may be a faster and more efficient way to get a response.  Please allow 48 business hours for a response.  Please remember that this is for non-urgent requests.   ? ?Due to recent changes in healthcare laws, you may see the results of your imaging and laboratory studies on MyChart before your provider has had a chance to review them.  We understand that in some cases there may be results that are confusing or concerning to you. Not all laboratory results come back in the same time frame and the provider may be waiting for multiple results in order to interpret others.  Please give [place] 48 hours in order for your provider to thoroughly review all the results before contacting the office for clarification of your results.  ? ?We have sent the following medications to your pharmacy for you to pick up at your convenience: ? ?Clenpiq ? ? ? ? ?We want to thank you for trusting [facility] with your care. All of our staff and providers value the relationships we have built with our patients, and it is an honor to care for you.  ? ?We are writing to let you know that [facility] will close on Apr 09, 2022, and we invite you to continue to see Dr. Carmell Austria and  Gerrit Heck at the [facility] [place] office location. We are consolidating our serices at these [facility] practices to better provide care. Our office staff will work with you to ensure a seamless transition.  ? ?Gerrit Heck, DO -Dr. Bryan Lemma will be movig to [facility] at [address], [place], [place] [address], effective Apr 09, 2022.  Contact [phone] to schedule an appointment with him.  ? ?Carmell Austria, MD- Dr. Lyndel Safe will be movig to [facility] at [address], [place], [place] [address], effective Apr 09, 2022.  Contact [phone] to schedule an appointment with him.  ? ?Requesting Medical Records ?If you need to request your medical records, please follow the instructions below. Your medical records are confidential, and a copy can be transferred to another provider or released to you or another person you designate only with your permission. ? ?There are several ways to request your medical records: ?Requests for medical records can be submitted through our practice.   ?You can also request your records electronically, in your MyChart account by selecting the ?Request Health Records? tab.  ?If you need additional information on how to request records, please go to [email or web address]/, choose Patient Information, then select Request Medical Records. ?To make an appointment or if you have any questions about your health care needs, please contact our office at [phone] and one of our staff members will be glad to assist you. ?[facility] is committed  to providing exceptional care for you and our community. Thank you for allowing [place] to serve your health care needs. ?Sincerely, ? ?Windy Canny, Director [facility] ?[facility] also offers convenient virtual care options. Sore throat? Sinus problems? Cold or flu symptoms? Get care from the comfort of home with [facility] Video Visits and e-Visits. Learn more about the  non-emergency conditions treated and start your virtual visit at [email or web address]  ? ?Thank you for choosing me and [facility]. ? ?Gerrit Heck, D.O. ? ?

## 2022-03-05 ENCOUNTER — Encounter: Payer: Self-pay | Admitting: Gastroenterology

## 2022-03-07 ENCOUNTER — Telehealth: Payer: Self-pay | Admitting: Cardiology

## 2022-03-07 ENCOUNTER — Telehealth: Payer: Self-pay | Admitting: Gastroenterology

## 2022-03-07 ENCOUNTER — Telehealth: Payer: Self-pay

## 2022-03-07 DIAGNOSIS — Z7901 Long term (current) use of anticoagulants: Secondary | ICD-10-CM

## 2022-03-07 NOTE — Telephone Encounter (Signed)
Pt c/o medication issue: ? ?1. Name of Medication:  ? ENTRESTO 24-26 MG  ? ? ?2. How are you currently taking this medication (dosage and times per day)? TAKE 1 TABLET BY MOUTH TWICE DAILY ? ?3. Are you having a reaction (difficulty breathing--STAT)? No ? ?4. What is your medication issue? Pt states that medication has become too expensive and like to know if there is any type of assistance that he can receive to help pay for it. Please advise ?

## 2022-03-07 NOTE — Telephone Encounter (Signed)
[facility] Pre-operative Risk Assessment  ?   ?Request for surgical clearance:     Endoscopy Procedure ? ?What type of surgery is being performed?     Colonoscopy ? ?When is this surgery scheduled?     03/16/2022 ? ?What type of clearance is required ?   Pharmacy ? ?Are there any medications that need to be held prior to surgery and how long? Warfarin for 5 days with Lovenox bridge ? ?Practice name and name of physician performing surgery?      [facility] ? ?What is your office phone and fax number?      Phone- [phone]  Fax- [phone] ? ?Anesthesia type (None, local, MAC, general) ?       MAC  ?

## 2022-03-07 NOTE — Telephone Encounter (Signed)
Left message for the pt to call the office so we may schedule a tele pre op appt.  ?

## 2022-03-07 NOTE — Telephone Encounter (Signed)
? ?  Pre-operative Risk Assessment  ?  ?Patient Name: Jeffrey Larson  ?DOB: [birth year] ?MRN: [id number]  ? ?  ? ?Request for Surgical Clearance   ? ?Procedure:   Colonoscopy  ? ?Date of Surgery:  Clearance 03/16/22                              ?   ?Surgeon:  Dr. Gerrit Heck   ?Surgeon's Group or Practice Name:  [facility]  ?Phone number:  [phone] ?Fax number:  [phone] ?  ?Type of Clearance Requested:   ?- Pharmacy:  Hold Warfarin (Coumadin) does not indicate how long ?Need to be cleared from a medical stand point too.  ?  ?Type of Anesthesia:  Not Indicated ?  ?Additional requests/questions:  Patient will need a Lovenox bridge prior to the procedure. ? ?Signed, ?Malanie C Hildebrandt   ?03/07/2022, 1:50 PM  ? ?

## 2022-03-07 NOTE — Telephone Encounter (Signed)
Patient with diagnosis of mechanical mitral valve on warfarin for anticoagulation.   ? ?Procedure:  Colonoscopy ?Date of procedure: 03/20/22 ? ?Scr and Plt are > 6 months old. Patient will go to [facility] to have a CBC and BMP drawn prior to the apt with coumadin clinic.  ? ?Per office protocol, patient can hold warfarin for 5 days prior to procedure.   ? ?Patient WILL need bridging with Lovenox (enoxaparin) around procedure. ? ?He has an appointment 4/18 with coumadin clinic where bridge will be coordinated ? ? ?

## 2022-03-07 NOTE — Telephone Encounter (Signed)
Says entresto co-pay is now $168 per month ?Advised we can do patient assistance if his annual 2 home household doesn't make over $78,880 per year. Says he is unable to give that information at this time but will call office back after checking his tax return.  ?

## 2022-03-07 NOTE — Telephone Encounter (Signed)
Informed the patient to come to our office to pick up the sample. Address sent through [facility]. ?

## 2022-03-07 NOTE — Telephone Encounter (Signed)
Patient is informed to hold warfarin 5 days prior to procedure. Patient is aware of his appointment 4/18 with coumadin clinic where bridge will be coordinated. ?

## 2022-03-07 NOTE — Telephone Encounter (Signed)
Closing this encounter due to duplication. See Surgical Clearance 03/16/2022 dated 03/07/22. ?

## 2022-03-07 NOTE — Telephone Encounter (Signed)
Patient called regarding CLENPIQ medication. Per patient, that medication is too expensive. Patient wants to know if there are other alternatives. Please advise.  ?

## 2022-03-07 NOTE — Telephone Encounter (Signed)
**  There were duplicate encounters, therefore I have closed the other encounter. ? ?Per Marcelle Overlie RPH-CPP:  ?Patient with diagnosis of mechanical mitral valve on warfarin for anticoagulation.   ?  ?Procedure:  Colonoscopy ?Date of procedure: 03/20/22 ?  ?Scr and Plt are > 6 months old. Patient will go to [facility] to have a CBC and BMP drawn prior to the apt with coumadin clinic.  ?  ?Per office protocol, patient can hold warfarin for 5 days prior to procedure.   ?  ?Patient WILL need bridging with Lovenox (enoxaparin) around procedure. ?  ?He has an appointment 4/18 with coumadin clinic where bridge will be coordinated ?  ? ?Primary Cardiologist:Branch, Roderic Palau, MD ? ?Chart reviewed as part of pre-operative protocol coverage. Because of Daymien Goth Mcglothen's past medical history and time since last visit, he/she will require a virtual visit/telephone call in order to better assess preoperative cardiovascular risk. ? ?Pre-op covering staff: ?- Please contact patient, obtain consent, and schedule appointment  ? ? ?Emmaline Life, NP-C ? ?  ?03/07/2022, 2:17 PM ?[facility] ?[address] ?Office [phone] Fax [phone] ?  ? ?

## 2022-03-08 ENCOUNTER — Telehealth: Payer: Self-pay | Admitting: *Deleted

## 2022-03-08 NOTE — Telephone Encounter (Signed)
Pt has been scheduled for a telephone visit 03/12/22 2:00. ? ?Consent on file, will need to call pt the a.m. of appointment for medication reconciliation. ? ?  ?Patient Consent for Virtual Visit  ? ? ?   ? ?Jeffrey Larson has provided verbal consent on 03/08/2022 for a virtual visit (video or telephone). ? ? ?CONSENT FOR VIRTUAL VISIT FOR:  Jeffrey Larson  ?By participating in this virtual visit I agree to the following: ? ?I hereby voluntarily request, consent and authorize [facility] and its employed or contracted physicians, physician assistants, nurse practitioners or other licensed health care professionals (the Practitioner), to provide me with telemedicine health care services (the ?Services") as deemed necessary by the treating Practitioner. I acknowledge and consent to receive the Services by the Practitioner via telemedicine. I understand that the telemedicine visit will involve communicating with the Practitioner through live audiovisual communication technology and the disclosure of certain medical information by electronic transmission. I acknowledge that I have been given the opportunity to request an in-person assessment or other available alternative prior to the telemedicine visit and am voluntarily participating in the telemedicine visit. ? ?I understand that I have the right to withhold or withdraw my consent to the use of telemedicine in the course of my care at any time, without affecting my right to future care or treatment, and that the Practitioner or I may terminate the telemedicine visit at any time. I understand that I have the right to inspect all information obtained and/or recorded in the course of the telemedicine visit and may receive copies of available information for a reasonable fee.  I understand that some of the potential risks of receiving the Services via telemedicine include:  ?Delay or interruption in medical evaluation due to technological equipment failure or  disruption; ?Information transmitted may not be sufficient (e.g. poor resolution of images) to allow for appropriate medical decision making by the Practitioner; and/or  ?In rare instances, security protocols could fail, causing a breach of personal health information. ? ?Furthermore, I acknowledge that it is my responsibility to provide information about my medical history, conditions and care that is complete and accurate to the best of my ability. I acknowledge that Practitioner's advice, recommendations, and/or decision may be based on factors not within their control, such as incomplete or inaccurate data provided by me or distortions of diagnostic images or specimens that may result from electronic transmissions. I understand that the practice of medicine is not an exact science and that Practitioner makes no warranties or guarantees regarding treatment outcomes. I acknowledge that a copy of this consent can be made available to me via my patient portal ([facility]), or I can request a printed copy by calling the office of [facility].   ? ?I understand that my insurance will be billed for this visit.  ? ?I have read or had this consent read to me. ?I understand the contents of this consent, which adequately explains the benefits and risks of the Services being provided via telemedicine.  ?I have been provided ample opportunity to ask questions regarding this consent and the Services and have had my questions answered to my satisfaction. ?I give my informed consent for the services to be provided through the use of telemedicine in my medical care ? ? ? ? ?

## 2022-03-08 NOTE — Telephone Encounter (Signed)
Pt has been scheduled for a telephone visit 03/12/22 2:00, consent on file. ?

## 2022-03-09 DIAGNOSIS — Z7901 Long term (current) use of anticoagulants: Secondary | ICD-10-CM | POA: Diagnosis not present

## 2022-03-10 LAB — BASIC METABOLIC PANEL
BUN/Creatinine Ratio: 20 (ref 10–24)
BUN: 20 mg/dL (ref 8–27)
CO2: 22 mmol/L (ref 20–29)
Calcium: 9.4 mg/dL (ref 8.6–10.2)
Chloride: 105 mmol/L (ref 96–106)
Creatinine, Ser: 1.01 mg/dL (ref 0.76–1.27)
Glucose: 105 mg/dL — ABNORMAL HIGH (ref 70–99)
Potassium: 3.9 mmol/L (ref 3.5–5.2)
Sodium: 142 mmol/L (ref 134–144)
eGFR: 83 mL/min/{1.73_m2} (ref >59)

## 2022-03-10 LAB — CBC
Hematocrit: 41.1 % (ref 37.5–51.0)
Hemoglobin: 13.8 g/dL (ref 13.0–17.7)
MCH: 29.4 pg (ref 26.6–33.0)
MCHC: 33.6 g/dL (ref 31.5–35.7)
MCV: 88 fL (ref 79–97)
Platelets: 271 10*3/uL (ref 150–450)
RBC: 4.69 x10E6/uL (ref 4.14–5.80)
RDW: 13.3 % (ref 11.6–15.4)
WBC: 7 10*3/uL (ref 3.4–10.8)

## 2022-03-12 ENCOUNTER — Ambulatory Visit (INDEPENDENT_AMBULATORY_CARE_PROVIDER_SITE_OTHER): Payer: Medicare Other | Admitting: Physician Assistant

## 2022-03-12 DIAGNOSIS — Z01810 Encounter for preprocedural cardiovascular examination: Secondary | ICD-10-CM

## 2022-03-12 NOTE — Progress Notes (Signed)
? ?Virtual Visit via Telephone Note  ? ?This visit type was conducted due to national recommendations for restrictions regarding the COVID-19 Pandemic (e.g. social distancing) in an effort to limit this patient's exposure and mitigate transmission in our community.  Due to his co-morbid illnesses, this patient is at least at moderate risk for complications without adequate follow up.  This format is felt to be most appropriate for this patient at this time.  The patient did not have access to video technology/had technical difficulties with video requiring transitioning to audio format only (telephone).  All issues noted in this document were discussed and addressed.  No physical exam could be performed with this format.  Please refer to the patient's chart for his  consent to telehealth for [facility]. ? ?Evaluation Performed:  Preoperative cardiovascular risk assessment ?_____________  ? ?Date:  03/12/2022  ? ?Patient ID:  Jeffrey Larson, Jeffrey Larson 07/11/1956, MRN [id number] ?Patient Location:  ?Home ?Provider location:   ?Office ? ?Primary Care Provider:  Pcp, No ?Primary Cardiologist:  Carlyle Dolly, MD ? ?Chief Complaint  ?  ?66 y.o. y/o male with a h/o rheumatic MS s/p mechanical MVR 2019, tricuspid valve repair, Maze procedure, nonobstructive CAD by cath 2019, permanent atrial fib, chronic diastolic CHF, COPD, Covid, pulmonary HTN, HTN, HLD who is pending colonoscopy, and presents today for telephonic preoperative cardiovascular risk assessment. ? ?Past Medical History  ?  ?Past Medical History:  ?Diagnosis Date  ? Atrial fibrillation ([place])   ? hx of  ? Cancer [facility])   ? skin cancer removed from arm dec 2021 melanoma  ? Chronic combined systolic and diastolic congestive heart failure ([place])   ? COPD with chronic bronchitis ([place])   ? Coronary artery disease   ? COVID-19 virus infection 06/2020  ? all symptoms resolved  ? Family history of breast cancer   ? Family history of breast cancer   ? Family history of  lung cancer   ? History of kidney stones   ? Hyperlipidemia   ? Hypertension   ? Longstanding persistent atrial fibrillation ([place])   ? Mitral stenosis with regurgitation   ? Myocardial infarction [facility]) 2015  ? stents x 2  ? Obesity   ? Personal history of colonic polyps   ? Pneumonia 2015 and 06-2020  ? Pulmonary hypertension ([place])   ? S/P Maze operation for atrial fibrillation 07/18/2018  ? Complete bilateral atrial lesion set using bipolar radiofrequency and cryothermy ablation with clipping of LA appendage  ? S/P mitral valve replacement with metallic valve 5/63/8937  ? Sorin Carbomedics Optiform bileaflet mechanical valve, size 33 mm  ? S/P TVR (tricuspid valve repair) 07/18/2018  ? Edwards mc3 ring annuloplasty, size 30 mm  ? Wears partial dentures   ? upper and lower  ? ?Past Surgical History:  ?Procedure Laterality Date  ? APPENDECTOMY    ? as a child  ? CARDIOVASCULAR STRESS TEST  03/2014  ? Borderline reversible ischemic changes at the apex.  Normal LV contractility/EF 52%.  ? CORONARY STENT PLACEMENT  7/12013  ? LEFT HEART CATHETERIZATION WITH CORONARY ANGIOGRAM N/A 06/12/2012  ? Procedure: LEFT HEART CATHETERIZATION WITH CORONARY ANGIOGRAM;  Surgeon: Clent Demark, MD;  Location: [facility] CATH LAB;  Service: Cardiovascular;  Laterality: N/A;  ? MAZE N/A 07/18/2018  ? Procedure: MAZE;  Surgeon: Rexene Alberts, MD;  Location: [facility];  Service: Open Heart Surgery;  Laterality: N/A;  ? MITRAL VALVE REPLACEMENT N/A 07/18/2018  ? Procedure: MITRAL VALVE (MV) REPLACEMENT  WITH CARBOMEDICS OPTIFORM MITRAL VALVE SIZE 33.;  Surgeon: Owen, Clarence H, MD;  Location: [facility] OR;  Service: Open Heart Surgery;  Laterality: N/A;  ? PERCUTANEOUS CORONARY STENT INTERVENTION (PCI-S) N/A 06/17/2012  ? Procedure: PERCUTANEOUS CORONARY STENT INTERVENTION (PCI-S);  Surgeon: Mohan N Harwani, MD;  Location: [facility] CATH LAB;  Service: Cardiovascular;  Laterality: N/A;  ? RECTAL EXAM UNDER ANESTHESIA N/A 12/15/2020  ? Procedure: ANORECTAL EXAM UNDER  ANESTHESIA EXCISION OF ANAL CANAL POLYP;  Surgeon: White, Christopher M, MD;  Location: [facility];  Service: General;  Laterality: N/A;  ? RIGHT/LEFT HEART CATH AND CORONARY ANGIOGRAPHY N/A 06/10/2018  ? Procedure: RIGHT/LEFT HEART CATH AND CORONARY ANGIOGRAPHY;  Surgeon: Kadakia, Ajay, MD;  Location: [facility] INVASIVE CV LAB;  Service: Cardiovascular;  Laterality: N/A;  ? TEE WITHOUT CARDIOVERSION N/A 06/10/2018  ? Procedure: TRANSESOPHAGEAL ECHOCARDIOGRAM (TEE) with bubble study;  Surgeon: Kadakia, Ajay, MD;  Location: [facility];  Service: Cardiovascular;  Laterality: N/A;  ? TEE WITHOUT CARDIOVERSION N/A 07/18/2018  ? Procedure: TRANSESOPHAGEAL ECHOCARDIOGRAM (TEE);  Surgeon: Owen, Clarence H, MD;  Location: [facility];  Service: Open Heart Surgery;  Laterality: N/A;  ? TRICUSPID VALVE REPLACEMENT N/A 07/18/2018  ? Procedure: TRICUSPID VALVE REPAIR WITH EDWARDS MC3 TRICUSPID ANNULOPLASTY RING SIZE 30.;  Surgeon: Owen, Clarence H, MD;  Location: [facility] OR;  Service: Open Heart Surgery;  Laterality: N/A;  ? ? ?Allergies ? ?Allergies  ?Allergen Reactions  ? Ramipril Cough  ?  cough  ? Percocet [Oxycodone-Acetaminophen]   ?  In ICU it contributed to hallucinations. Can take by itself  ? ? ?History of Present Illness  ?  ?Jeffrey Larson is a 65 y.o. male who presents via audio/video conferencing for a telehealth visit today.  Pt was last seen in cardiology clinic on 12/19/21 by Dr. Branch.  At that time Jeffrey Larson was doing well.  The patient is now pending colonoscopy.  Since his last visit, he has been doing well without any new cardiac complaints. He does not have VS for review today but states most recent BP at outside office was 115/70. I personally reviewed med rec today as this had not been clarified prior to visit. ? ?Home Medications  ?  ?Prior to Admission medications   ?Medication Sig Start Date End Date Taking? Authorizing Provider  ?acetaminophen (TYLENOL) 500 MG tablet Take 1,000 mg by mouth 2  (two) times daily.    [provider]  ?aspirin 81 MG tablet Take 81 mg by mouth daily.    [provider]  ?atorvastatin (LIPITOR) 40 MG tablet Take 1 tablet (40 mg total) by mouth every evening. 06/21/21   Alexander, Natalie, DO  ?ENTRESTO 24-26 MG TAKE 1 TABLET BY MOUTH TWICE DAILY 08/14/21   Branch, Jonathan F, MD  ?metoprolol succinate (TOPROL-XL) 25 MG 24 hr tablet TAKE 1 TABLET(25 MG) BY MOUTH DAILY 06/05/21   Barrett, Rhonda G, PA-C  ?nitroGLYCERIN (NITROSTAT) 0.4 MG SL tablet Place 1 tablet (0.4 mg total) under the tongue every 5 (five) minutes x 3 doses as needed for chest pain. NOT TAKING 12/03/18 01/26/25  Bensimhon, Daniel R, MD  ?Semaglutide, 1 MG/DOSE, (OZEMPIC, 1 MG/DOSE,) 4 MG/3ML SOPN Inject 1 mg into the skin once a week. 06/21/21   Alexander, Natalie, DO  ?sildenafil (REVATIO) 20 MG tablet Take 1 tablet (20 mg total) by mouth 3 (three) times daily. 07/06/21   Branch, Jonathan F, MD  ?Sod Picosulfate-Mag Ox-Cit Acd (CLENPIQ) 10-3.5-12 MG-GM -GM/160ML SOLN Take 1 kit by   mouth as directed. 02/22/22   Cirigliano, Vito V, DO  ?torsemide (DEMADEX) 20 MG tablet TAKE 2 TABLET BY MOUTH EVERY DAY 07/11/20   Barrett, Evelene Croon, PA-C  ?warfarin (COUMADIN) 4 MG tablet TAKE 4 MG DAILY EXCEPT TAKE 6 MG ON SATURDAYS ?Takes 38m on Mondays and Thursdays Now 10/06/21   BArnoldo Lenis MD  ? ? ?Physical Exam  ?  ?Vital Signs:  BDASEAN BROWdoes not have vital signs available for review today. ? ?Given telephonic nature of communication, physical exam is limited. ?AAOx3. NAD. Normal affect.  Speech and respirations are unlabored. ? ?Accessory Clinical Findings  ?  ?None ? ?Assessment & Plan  ?  ?1.  Preoperative Cardiovascular Risk Assessment: The patient affirms he has been doing well without any new cardiac symptoms. Therefore, based on ACC/AHA guidelines, the patient would be at acceptable risk for the planned procedure without further cardiovascular testing. The patient was advised that if he  develops new symptoms prior to surgery to contact our office to arrange for a follow-up visit, and he verbalized understanding. ? ?Per office protocol, patient can hold warfarin for 5 days prior to procedure.

## 2022-03-13 ENCOUNTER — Ambulatory Visit (INDEPENDENT_AMBULATORY_CARE_PROVIDER_SITE_OTHER): Payer: Medicare Other | Admitting: *Deleted

## 2022-03-13 DIAGNOSIS — Z954 Presence of other heart-valve replacement: Secondary | ICD-10-CM | POA: Diagnosis not present

## 2022-03-13 DIAGNOSIS — Z5181 Encounter for therapeutic drug level monitoring: Secondary | ICD-10-CM | POA: Diagnosis not present

## 2022-03-13 DIAGNOSIS — I4891 Unspecified atrial fibrillation: Secondary | ICD-10-CM

## 2022-03-13 LAB — POCT INR: INR: 2.8 (ref 2.0–3.0)

## 2022-03-13 MED ORDER — ENOXAPARIN SODIUM 120 MG/0.8ML IJ SOSY: 120.0000 mg | PREFILLED_SYRINGE | Freq: Two times a day (BID) | INTRAMUSCULAR | 0 refills | Status: DC

## 2022-03-13 NOTE — Patient Instructions (Addendum)
03/20/22  Scheduled for colonoscopy ? ?Labs:  03/09/22  Hgb 13.8  Hct 41.1  Plts 271  SCr 1.01  CrCl 126.75  Wt 122.9kg ? ?Lovenox '120mg'$  sq twice daily at 7am & 7pm   Rx sent to [facility]  # 10 syringes.  He has 13 syringes at home that are in date. ? ?4/19  Take last dose of warfarin ('4mg'$ ) ?4/20  No Lovenox or warfarin ?4/21 - 4/23  Lovenox '120mg'$  sq twice daily at 7am & 7pm ?4/24  Lovenox '120mg'$  @ 7am ------  No Lovenox in pm ?4/25  No Lovenox this am ------procedure ----------warfarin '6mg'$  pm ?4/26 - 4/28  Lovenox '120mg'$  at 7am & 7pm and warfarin '6mg'$  in pm ?4/29 - 4/30  Lovenox '120mg'$  at 7am & 7pm and warfarin '4mg'$  in pm ?5/1  Lovenox '120mg'$  at 7am -----------  INR check at 2:30pm ?

## 2022-03-16 ENCOUNTER — Encounter: Payer: Medicare Other | Admitting: Gastroenterology

## 2022-03-20 ENCOUNTER — Ambulatory Visit (AMBULATORY_SURGERY_CENTER): Payer: Medicare Other | Admitting: Gastroenterology

## 2022-03-20 ENCOUNTER — Encounter: Payer: Self-pay | Admitting: Gastroenterology

## 2022-03-20 VITALS — BP 111/92 | HR 56 | Temp 98.3°F | Resp 13 | Ht 71.0 in | Wt 271.0 lb

## 2022-03-20 DIAGNOSIS — Z86010 Personal history of colonic polyps: Secondary | ICD-10-CM | POA: Diagnosis not present

## 2022-03-20 DIAGNOSIS — Z7901 Long term (current) use of anticoagulants: Secondary | ICD-10-CM

## 2022-03-20 DIAGNOSIS — K640 First degree hemorrhoids: Secondary | ICD-10-CM

## 2022-03-20 DIAGNOSIS — D123 Benign neoplasm of transverse colon: Secondary | ICD-10-CM

## 2022-03-20 DIAGNOSIS — D125 Benign neoplasm of sigmoid colon: Secondary | ICD-10-CM

## 2022-03-20 MED ORDER — SODIUM CHLORIDE 0.9 % IV SOLN: 500.0000 mL | Freq: Once | INTRAVENOUS | Status: DC

## 2022-03-20 NOTE — Patient Instructions (Signed)
YOU HAD AN ENDOSCOPIC PROCEDURE TODAY AT [facility]:   Refer to the procedure report that was given to you for any specific questions about what was found during the examination.  If the procedure report does not answer your questions, please call your gastroenterologist to clarify.  If you requested that your care partner not be given the details of your procedure findings, then the procedure report has been included in a sealed envelope for you to review at your convenience later. ? ?**Handouts given on polyps and hemorrhoids** ? ?YOU SHOULD EXPECT: Some feelings of bloating in the abdomen. Passage of more gas than usual.  Walking can help get rid of the air that was put into your GI tract during the procedure and reduce the bloating. If you had a lower endoscopy (such as a colonoscopy or flexible sigmoidoscopy) you may notice spotting of blood in your stool or on the toilet paper. If you underwent a bowel prep for your procedure, you may not have a normal bowel movement for a few days. ? ?Please Note:  You might notice some irritation and congestion in your nose or some drainage.  This is from the oxygen used during your procedure.  There is no need for concern and it should clear up in a day or so. ? ?SYMPTOMS TO REPORT IMMEDIATELY: ? ?Following lower endoscopy (colonoscopy or flexible sigmoidoscopy): ? Excessive amounts of blood in the stool ? Significant tenderness or worsening of abdominal pains ? Swelling of the abdomen that is new, acute ? Fever of 100?F or higher ? ? ?For urgent or emergent issues, a gastroenterologist can be reached at any hour by calling [phone]. ?Do not use MyChart messaging for urgent concerns.  ? ? ?DIET:  We do recommend a small meal at first, but then you may proceed to your regular diet.  Drink plenty of fluids but you should avoid alcoholic beverages for 24 hours. ? ?ACTIVITY:  You should plan to take it easy for the rest of today and you should NOT DRIVE  or use heavy machinery until tomorrow (because of the sedation medicines used during the test).   ? ?FOLLOW UP: ?Our staff will call the number listed on your records 48-72 hours following your procedure to check on you and address any questions or concerns that you may have regarding the information given to you following your procedure. If we do not reach you, we will leave a message.  We will attempt to reach you two times.  During this call, we will ask if you have developed any symptoms of COVID 19. If you develop any symptoms (ie: fever, flu-like symptoms, shortness of breath, cough etc.) before then, please call [phone].  If you test positive for Covid 19 in the 2 weeks post procedure, please call and report this information to [place].   ? ?If any biopsies were taken you will be contacted by phone or by letter within the next 1-3 weeks.  Please call us at [phone] if you have not heard about the biopsies in 3 weeks.  ? ? ?SIGNATURES/CONFIDENTIALITY: ?You and/or your care partner have signed paperwork which will be entered into your electronic medical record.  These signatures attest to the fact that that the information above on your After Visit Summary has been reviewed and is understood.  Full responsibility of the confidentiality of this discharge information lies with you and/or your care-partner.  ?

## 2022-03-20 NOTE — Progress Notes (Signed)
Vitals-CW ?

## 2022-03-20 NOTE — Progress Notes (Signed)
? ?GASTROENTEROLOGY PROCEDURE H&P NOTE  ? ?Primary Care Physician: ?Pcp, No ? ? ? ?Reason for Procedure:   Hx of colon polyps, polyp surveillance ? ?Plan:    Colonoscopy ? ?Patient is appropriate for endoscopic procedure(s) in the ambulatory ([place]) setting. ? ?The nature of the procedure, as well as the risks, benefits, and alternatives were carefully and thoroughly reviewed with the patient. Ample time for discussion and questions allowed. The patient understood, was satisfied, and agreed to proceed.  ? ? ? ?HPI: ?Jeffrey Larson is a 66 y.o. male who presents for Colonoscopy for evaluation of ongoing polyp surveillance.  Patient was most recently seen in the Gastroenterology Clinic on 02/22/2022 by me.  No interval change in medical history since that appointment. Please refer to that note for full details regarding GI history and clinical presentation.  ? ?Holding Coumadin with Lovenox bridge for procedure today.  ? ?-07/25/2020: Positive Cologuard.  Interestingly, INR was supratherapeutic at 4.1 at that time. ?- 09/15/2020: Initial appointment in GI clinic.  No prior colonoscopy. ?- 11/01/2020: Colonoscopy: 16 subcentimeter polyps scattered throughout the colon (path: Tubular adenomas), 12 mm distal sigmoid pedunculated adenoma, sigmoid diverticulosis.  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  Recommended 1 year repeat ?- 12/15/2020: Surgical excision of AIN-1 by Dr. Dema Severin ?- 12/20/2020: Evaluation in the [facility].  Never completed genetic testing due to cost; hasn't followed-up ? ?Past Medical History:  ?Diagnosis Date  ? Atrial fibrillation ([place])   ? hx of  ? Cancer [facility])   ? skin cancer removed from arm dec 2021 melanoma  ? Chronic combined systolic and diastolic congestive heart failure ([place])   ? COPD with chronic bronchitis ([place])   ? Coronary artery disease   ? COVID-19 virus infection 06/2020  ? all symptoms resolved  ? Family history of breast cancer   ? Family history of breast  cancer   ? Family history of lung cancer   ? History of kidney stones   ? Hyperlipidemia   ? Hypertension   ? Longstanding persistent atrial fibrillation ([place])   ? Mitral stenosis with regurgitation   ? Myocardial infarction [facility]) 2015  ? stents x 2  ? Obesity   ? Personal history of colonic polyps   ? Pneumonia 2015 and 06-2020  ? Pulmonary hypertension ([place])   ? S/P Maze operation for atrial fibrillation 07/18/2018  ? Complete bilateral atrial lesion set using bipolar radiofrequency and cryothermy ablation with clipping of LA appendage  ? S/P mitral valve replacement with metallic valve 6/72/0947  ? Sorin Carbomedics Optiform bileaflet mechanical valve, size 33 mm  ? S/P TVR (tricuspid valve repair) 07/18/2018  ? Edwards mc3 ring annuloplasty, size 30 mm  ? Wears partial dentures   ? upper and lower  ? ? ?Past Surgical History:  ?Procedure Laterality Date  ? APPENDECTOMY    ? as a child  ? CARDIOVASCULAR STRESS TEST  03/2014  ? Borderline reversible ischemic changes at the apex.  Normal LV contractility/EF 52%.  ? CORONARY STENT PLACEMENT  7/12013  ? LEFT HEART CATHETERIZATION WITH CORONARY ANGIOGRAM N/A 06/12/2012  ? Procedure: LEFT HEART CATHETERIZATION WITH CORONARY ANGIOGRAM;  Surgeon: Clent Demark, MD;  Location: [facility] CATH LAB;  Service: Cardiovascular;  Laterality: N/A;  ? MAZE N/A 07/18/2018  ? Procedure: MAZE;  Surgeon: Rexene Alberts, MD;  Location: [place];  Service: Open Heart Surgery;  Laterality: N/A;  ? MITRAL VALVE REPLACEMENT N/A 07/18/2018  ? Procedure: MITRAL VALVE (MV) REPLACEMENT WITH  CARBOMEDICS OPTIFORM MITRAL VALVE SIZE 33.;  Surgeon: Rexene Alberts, MD;  Location: [place];  Service: Open Heart Surgery;  Laterality: N/A;  ? PERCUTANEOUS CORONARY STENT INTERVENTION (PCI-S) N/A 06/17/2012  ? Procedure: PERCUTANEOUS CORONARY STENT INTERVENTION (PCI-S);  Surgeon: Clent Demark, MD;  Location: [facility] CATH LAB;  Service: Cardiovascular;  Laterality: N/A;  ? RECTAL EXAM UNDER ANESTHESIA N/A 12/15/2020  ?  Procedure: ANORECTAL EXAM UNDER ANESTHESIA EXCISION OF ANAL CANAL POLYP;  Surgeon: Ileana Roup, MD;  Location: [place];  Service: General;  Laterality: N/A;  ? RIGHT/LEFT HEART CATH AND CORONARY ANGIOGRAPHY N/A 06/10/2018  ? Procedure: RIGHT/LEFT HEART CATH AND CORONARY ANGIOGRAPHY;  Surgeon: Dixie Dials, MD;  Location: [place] CV LAB;  Service: Cardiovascular;  Laterality: N/A;  ? TEE WITHOUT CARDIOVERSION N/A 06/10/2018  ? Procedure: TRANSESOPHAGEAL ECHOCARDIOGRAM (TEE) with bubble study;  Surgeon: Dixie Dials, MD;  Location: [place] ENDOSCOPY;  Service: Cardiovascular;  Laterality: N/A;  ? TEE WITHOUT CARDIOVERSION N/A 07/18/2018  ? Procedure: TRANSESOPHAGEAL ECHOCARDIOGRAM (TEE);  Surgeon: Rexene Alberts, MD;  Location: [place];  Service: Open Heart Surgery;  Laterality: N/A;  ? TRICUSPID VALVE REPLACEMENT N/A 07/18/2018  ? Procedure: TRICUSPID VALVE REPAIR WITH EDWARDS MC3 TRICUSPID ANNULOPLASTY RING SIZE 30.;  Surgeon: Rexene Alberts, MD;  Location: [place];  Service: Open Heart Surgery;  Laterality: N/A;  ? ? ?Prior to Admission medications   ?Medication Sig Start Date End Date Taking? Authorizing Provider  ?acetaminophen (TYLENOL) 500 MG tablet Take 1,000 mg by mouth 2 (two) times daily.   Yes [provider]  ?aspirin 81 MG tablet Take 81 mg by mouth daily.   Yes [provider]  ?atorvastatin (LIPITOR) 40 MG tablet Take 1 tablet (40 mg total) by mouth every evening. 06/21/21  Yes [name], DO  ?enoxaparin (LOVENOX) 120 MG/0.8ML injection Inject 0.8 mLs (120 mg total) into the skin every 12 (twelve) hours. 03/13/22  Yes Branch, Alphonse Guild, MD  ?ENTRESTO 24-26 MG TAKE 1 TABLET BY MOUTH TWICE DAILY 08/14/21  Yes Arnoldo Lenis, MD  ?metoprolol succinate (TOPROL-XL) 25 MG 24 hr tablet TAKE 1 TABLET(25 MG) BY MOUTH DAILY 06/05/21  Yes Barrett, Evelene Croon, PA-C  ?torsemide (DEMADEX) 20 MG tablet TAKE 2 TABLET BY MOUTH EVERY DAY 07/11/20  Yes Barrett, Evelene Croon,  PA-C  ?Semaglutide, 1 MG/DOSE, (OZEMPIC, 1 MG/DOSE,) 4 MG/3ML SOPN Inject 1 mg into the skin once a week. 06/21/21   [name], DO  ?sildenafil (REVATIO) 20 MG tablet Take 1 tablet (20 mg total) by mouth 3 (three) times daily. 07/06/21   Arnoldo Lenis, MD  ?warfarin (COUMADIN) 4 MG tablet TAKE 4 MG DAILY EXCEPT TAKE 6 MG ON SATURDAYS ?Patient taking differently: Original Rx: TAKE 4 MG DAILY EXCEPT TAKE 6 MG ON SATURDAYS ?Patient taking: Take '4mg'$  daily except '6mg'$  on Mondays and Thursdays 10/06/21   Arnoldo Lenis, MD  ? ? ?Current Outpatient Medications  ?Medication Sig Dispense Refill  ? acetaminophen (TYLENOL) 500 MG tablet Take 1,000 mg by mouth 2 (two) times daily.    ? aspirin 81 MG tablet Take 81 mg by mouth daily.    ? atorvastatin (LIPITOR) 40 MG tablet Take 1 tablet (40 mg total) by mouth every evening. 90 tablet 3  ? enoxaparin (LOVENOX) 120 MG/0.8ML injection Inject 0.8 mLs (120 mg total) into the skin every 12 (twelve) hours. 8 mL 0  ? ENTRESTO 24-26 MG TAKE 1 TABLET BY MOUTH TWICE DAILY 180 tablet 3  ?  metoprolol succinate (TOPROL-XL) 25 MG 24 hr tablet TAKE 1 TABLET(25 MG) BY MOUTH DAILY 90 tablet 3  ? torsemide (DEMADEX) 20 MG tablet TAKE 2 TABLET BY MOUTH EVERY DAY 60 tablet 11  ? Semaglutide, 1 MG/DOSE, (OZEMPIC, 1 MG/DOSE,) 4 MG/3ML SOPN Inject 1 mg into the skin once a week. 9 mL 11  ? sildenafil (REVATIO) 20 MG tablet Take 1 tablet (20 mg total) by mouth 3 (three) times daily. 270 tablet 3  ? warfarin (COUMADIN) 4 MG tablet TAKE 4 MG DAILY EXCEPT TAKE 6 MG ON SATURDAYS (Patient taking differently: Original Rx: TAKE 4 MG DAILY EXCEPT TAKE 6 MG ON SATURDAYS ?Patient taking: Take '4mg'$  daily except '6mg'$  on Mondays and Thursdays) 120 tablet 2  ? ?Current Facility-Administered Medications  ?Medication Dose Route Frequency Provider Last Rate Last Admin  ? 0.9 %  sodium chloride infusion  500 mL Intravenous Once Jordie Skalsky V, DO      ? ? ?Allergies as of 03/20/2022 - Review Complete  03/20/2022  ?Allergen Reaction Noted  ? Ramipril Cough 08/12/2014  ? Percocet [oxycodone-acetaminophen]  07/19/2018  ? ? ?Family History  ?Problem Relation Age of Onset  ? Heart disease Mother   ? Heart diseas

## 2022-03-20 NOTE — Progress Notes (Signed)
To Pacu, VSS. Report to [email or web address] 

## 2022-03-20 NOTE — Op Note (Signed)
[place] ?Patient Name: Jeffrey Larson ?Procedure Date: 03/20/2022 3:29 PM ?MRN: [id number] ?Endoscopist: Gerrit Heck , MD ?Age: 66 ?Referring MD:  ?Date of Birth: 1956/04/19 ?Gender: Male ?Account #: [id number] ?Procedure:                Colonoscopy ?Indications:              Surveillance: History of numerous (> 10) adenomas  ?                          on last colonoscopy (< 3 yrs) ?                          11/01/2020: Colonoscopy: 16 subcentimeter polyps  ?                          scattered throughout the colon (path: Tubular  ?                          adenomas), 12 mm distal sigmoid pedunculated  ?                          adenoma, sigmoid diverticulosis. ??Hypertrophied  ?                          anal papilla??with path demonstrating AIN-1. Now s/p  ?                          surgical resection of AIN-1. ?Medicines:                Monitored Anesthesia Care ?Procedure:                Pre-Anesthesia Assessment: ?                          - Prior to the procedure, a History and Physical  ?                          was performed, and patient medications and  ?                          allergies were reviewed. The patient's tolerance of  ?                          previous anesthesia was also reviewed. The risks  ?                          and benefits of the procedure and the sedation  ?                          options and risks were discussed with the patient.  ?                          All questions were answered, and informed consent  ?  was obtained. Prior Anticoagulants: The patient has  ?                          taken Coumadin (warfarin), last dose was 5 days  ?                          prior to procedure. Currently taking Lovenox  ?                          bridge. ASA Grade Assessment: III - A patient with  ?                          severe systemic disease. After reviewing the risks  ?                          and benefits, the patient was deemed in  ?                           satisfactory condition to undergo the procedure. ?                          After obtaining informed consent, the colonoscope  ?                          was passed under direct vision. Throughout the  ?                          procedure, the patient's blood pressure, pulse, and  ?                          oxygen saturations were monitored continuously. The  ?                          CF [id number] #[id number] was introduced through the anus  ?                          and advanced to the the cecum, identified by  ?                          appendiceal orifice and ileocecal valve. The  ?                          colonoscopy was technically difficult and complex  ?                          due to significant looping. Successful completion  ?                          of the procedure was aided by applying abdominal  ?                          pressure. The patient tolerated the procedure well.  ?  The quality of the bowel preparation was fair and  ?                          required copious irrigation and lavage to achieve  ?                          adequate views. The ileocecal valve, appendiceal  ?                          orifice, and rectum were photographed. ?Scope In: 3:36:44 PM ?Scope Out: 3:58:44 PM ?Scope Withdrawal Time: 0 hours 15 minutes 34 seconds  ?Total Procedure Duration: 0 hours 22 minutes 0 seconds  ?Findings:                 Hemorrhoids were found on perianal exam. ?                          Three sessile polyps were found in the sigmoid  ?                          colon (2) and transverse colon. The polyps were 3  ?                          to 5 mm in size. These polyps were removed with a  ?                          cold snare. Resection and retrieval were complete.  ?                          Estimated blood loss was minimal. ?                          A moderate amount of solid stool and solid food  ?                          debris was found scattered  throughout the colon.  ?                          Able to adequately lavage most of the areas using  ?                          copious amounts of tap water, resulting in  ?                          clearance with fair visualization. There were a few  ?                          areas in the proximal ascending colon, sigmoid, and  ?                          rectum with solid food material and solid stool  ?  which could not be fully evaluated. Cannot rule out  ?                          the presence of small or flat polyps in these areas. ?                          Non-bleeding internal hemorrhoids were found during  ?                          retroflexion. The hemorrhoids were small. Due to  ?                          solid stool and solid food materials in the distal  ?                          rectum, cannot fully evaluate the previous site of  ?                          AIN-1. ?                          The ascending colon revealed significantly  ?                          excessive looping. Advancing the scope required  ?                          using manual pressure. ?Complications:            No immediate complications. ?Estimated Blood Loss:     Estimated blood loss was minimal. ?Impression:               - Preparation of the colon was fair. ?                          - Hemorrhoids found on perianal exam. ?                          - Three 3 to 5 mm polyps in the sigmoid colon and  ?                          in the transverse colon, removed with a cold snare.  ?                          Resected and retrieved. ?                          - Stool in the sigmoid colon. ?                          - Non-bleeding internal hemorrhoids. ?                          - There was significant looping of the colon. ?Recommendation:           - Patient has  a contact number available for  ?                          emergencies. The signs and symptoms of potential  ?                          delayed  complications were discussed with the  ?                          patient. Return to normal activities tomorrow.  ?                          Written discharge instructions were provided to the  ?                          patient. ?                          - Resume previous diet. ?                          - Continue present medications. ?                          - Resume Coumadin (warfarin) at prior dose today.  ?                          Refer to Coumadin Clinic for further adjustment of  ?                          therapy of Coumadin and remainder of Lovenox bridge. ?                          - Await pathology results. ?                          - Repeat colonoscopy in 2 years because the quality  ?                          of the bowel preparation and for ongoing  ?                          surveillance. ?                          - Return to GI clinic PRN. ?Gerrit Heck, MD ?03/20/2022 4:08:54 PM ?

## 2022-03-20 NOTE — Progress Notes (Signed)
Called to room to assist during endoscopic procedure.  Patient ID and intended procedure confirmed with present staff. Received instructions for my participation in the procedure from the performing physician.  

## 2022-03-22 ENCOUNTER — Telehealth: Payer: Self-pay

## 2022-03-22 NOTE — Telephone Encounter (Signed)
?  Follow up Call- ? ? ?  03/20/2022  ?  3:03 PM 11/01/2020  ?  1:08 PM  ?Call back number  ?Post procedure Call Back phone  # [phone]  ?Permission to leave phone message Yes Yes  ?  ? ?Patient questions: ? ?Do you have a fever, pain , or abdominal swelling? No. ?Pain Score  0 * ? ?Have you tolerated food without any problems? Yes.   ? ?Have you been able to return to your normal activities? Yes.   ? ?Do you have any questions about your discharge instructions: ?Diet   No. ?Medications  No. ?Follow up visit  No. ? ?Do you have questions or concerns about your Care? No. ? ?Actions: ?* If pain score is 4 or above: ?No action needed, pain <4. ? ? ?

## 2022-03-26 ENCOUNTER — Ambulatory Visit (INDEPENDENT_AMBULATORY_CARE_PROVIDER_SITE_OTHER): Payer: Medicare Other | Admitting: *Deleted

## 2022-03-26 DIAGNOSIS — Z5181 Encounter for therapeutic drug level monitoring: Secondary | ICD-10-CM | POA: Diagnosis not present

## 2022-03-26 DIAGNOSIS — I4891 Unspecified atrial fibrillation: Secondary | ICD-10-CM | POA: Diagnosis not present

## 2022-03-26 DIAGNOSIS — Z954 Presence of other heart-valve replacement: Secondary | ICD-10-CM | POA: Diagnosis not present

## 2022-03-26 LAB — POCT INR: INR: 2.7 (ref 2.0–3.0)

## 2022-03-26 NOTE — Patient Instructions (Signed)
Continue warfarin 1 tablet daily except 1 1/2 tablets on Mondays and Thursdays. ?Stop Lovenox ?Recheck in 4 wks ?

## 2022-03-28 ENCOUNTER — Encounter: Payer: Self-pay | Admitting: Gastroenterology

## 2022-04-02 ENCOUNTER — Encounter: Payer: Self-pay | Admitting: Family Medicine

## 2022-04-02 ENCOUNTER — Ambulatory Visit (INDEPENDENT_AMBULATORY_CARE_PROVIDER_SITE_OTHER): Payer: Medicare Other | Admitting: Family Medicine

## 2022-04-02 VITALS — BP 107/67 | HR 75 | Ht 71.0 in | Wt 270.0 lb

## 2022-04-02 DIAGNOSIS — L570 Actinic keratosis: Secondary | ICD-10-CM

## 2022-04-02 DIAGNOSIS — I10 Essential (primary) hypertension: Secondary | ICD-10-CM | POA: Diagnosis not present

## 2022-04-02 DIAGNOSIS — J449 Chronic obstructive pulmonary disease, unspecified: Secondary | ICD-10-CM

## 2022-04-02 DIAGNOSIS — E782 Mixed hyperlipidemia: Secondary | ICD-10-CM | POA: Diagnosis not present

## 2022-04-02 DIAGNOSIS — I4811 Longstanding persistent atrial fibrillation: Secondary | ICD-10-CM

## 2022-04-02 DIAGNOSIS — R7303 Prediabetes: Secondary | ICD-10-CM | POA: Insufficient documentation

## 2022-04-02 MED ORDER — PREDNISONE 50 MG PO TABS: ORAL_TABLET | ORAL | 0 refills | Status: DC

## 2022-04-02 NOTE — Assessment & Plan Note (Signed)
BP is well controlled at this time.  ?

## 2022-04-02 NOTE — Assessment & Plan Note (Signed)
Lesion on Left hand treated with cryotherapy today.   ?

## 2022-04-02 NOTE — Assessment & Plan Note (Signed)
Doing well with Ozempic.  Continue and update a1c today.  ?

## 2022-04-02 NOTE — Assessment & Plan Note (Signed)
He is having increased wheezing.  Sputum remains clear.  Adding course of prednisone and he will continue albuterol prn.  Contact clinic for follow up if symptoms worsen.   ?

## 2022-04-02 NOTE — Patient Instructions (Addendum)
Start prednisone daily for 5 days.  ?Use albuterol inhaler as needed.  ?Have labs completed when fasting.  ?If area on hand does not resolve let me know.  ?

## 2022-04-02 NOTE — Assessment & Plan Note (Signed)
Persistent a. Fib that is rate controlled.  He is anticoagulated with warfarin.  He will continue management per cardiology.   ?

## 2022-04-02 NOTE — Progress Notes (Signed)
?Jeffrey Larson - 66 y.o. male MRN [id number]  Date of birth: 01-29-1956 ? ?Subjective ?Chief Complaint  ?Patient presents with  ? Transitions Of Care  ? ? ?HPI ?Jeffrey Larson is a 66 y.o. male here today for follow up visit.  He is a former patient of Dr. Sheppard Coil.  He has a history of HTN, prediabetes, a. Fib and CHF.  ? ?Reports that overall he is doing well.  Continues to see cardiology regularly for management of A. Fib and CHF.  Denies new or increased symptoms.  Currently on entresto and metoprolol.  He is anticoagulated with warfarin.   ? ?He has been taking Ozempic due to history of prediabetes and obesity.  He is tolerating this well at current dose of '1mg'$  weekly.  ? ?He does have a skin lesion on the L hand.  He had this treated with liquid nitrogen previously however never resolved.  Denies pain.    ? ?Additionally he is having some chest congestion with wheezing.  Sputum is clear. Denies significant shortness of breath.  He is using albuterol with relief.  Denies fever or chills.  Negative COVID.  ? ?ROS:  A comprehensive ROS was completed and negative except as noted per HPI ? ?Allergies  ?Allergen Reactions  ? Ramipril Cough  ?  cough  ? Percocet [Oxycodone-Acetaminophen]   ?  In ICU it contributed to hallucinations. Can take by itself  ? ? ?Past Medical History:  ?Diagnosis Date  ? Atrial fibrillation ([place])   ? hx of  ? Cancer [facility])   ? skin cancer removed from arm dec 2021 melanoma  ? Chronic combined systolic and diastolic congestive heart failure ([place])   ? COPD with chronic bronchitis ([place])   ? Coronary artery disease   ? COVID-19 virus infection 06/2020  ? all symptoms resolved  ? Family history of breast cancer   ? Family history of breast cancer   ? Family history of lung cancer   ? History of kidney stones   ? Hyperlipidemia   ? Hypertension   ? Longstanding persistent atrial fibrillation ([place])   ? Mitral stenosis with regurgitation   ? Myocardial infarction [facility]) 2015  ? stents x 2  ?  Obesity   ? Personal history of colonic polyps   ? Pneumonia 2015 and 06-2020  ? Pulmonary hypertension ([place])   ? S/P Maze operation for atrial fibrillation 07/18/2018  ? Complete bilateral atrial lesion set using bipolar radiofrequency and cryothermy ablation with clipping of LA appendage  ? S/P mitral valve replacement with metallic valve 01/21/3334  ? Sorin Carbomedics Optiform bileaflet mechanical valve, size 33 mm  ? S/P TVR (tricuspid valve repair) 07/18/2018  ? Edwards mc3 ring annuloplasty, size 30 mm  ? Wears partial dentures   ? upper and lower  ? ? ?Past Surgical History:  ?Procedure Laterality Date  ? APPENDECTOMY    ? as a child  ? CARDIOVASCULAR STRESS TEST  03/2014  ? Borderline reversible ischemic changes at the apex.  Normal LV contractility/EF 52%.  ? CORONARY STENT PLACEMENT  7/12013  ? LEFT HEART CATHETERIZATION WITH CORONARY ANGIOGRAM N/A 06/12/2012  ? Procedure: LEFT HEART CATHETERIZATION WITH CORONARY ANGIOGRAM;  Surgeon: Clent Demark, MD;  Location: [facility] CATH LAB;  Service: Cardiovascular;  Laterality: N/A;  ? MAZE N/A 07/18/2018  ? Procedure: MAZE;  Surgeon: Rexene Alberts, MD;  Location: [place];  Service: Open Heart Surgery;  Laterality: N/A;  ? MITRAL VALVE REPLACEMENT N/A 07/18/2018  ?  Procedure: MITRAL VALVE (MV) REPLACEMENT WITH CARBOMEDICS OPTIFORM MITRAL VALVE SIZE 33.;  Surgeon: Rexene Alberts, MD;  Location: [place];  Service: Open Heart Surgery;  Laterality: N/A;  ? PERCUTANEOUS CORONARY STENT INTERVENTION (PCI-S) N/A 06/17/2012  ? Procedure: PERCUTANEOUS CORONARY STENT INTERVENTION (PCI-S);  Surgeon: Clent Demark, MD;  Location: [facility] CATH LAB;  Service: Cardiovascular;  Laterality: N/A;  ? RECTAL EXAM UNDER ANESTHESIA N/A 12/15/2020  ? Procedure: ANORECTAL EXAM UNDER ANESTHESIA EXCISION OF ANAL CANAL POLYP;  Surgeon: Ileana Roup, MD;  Location: [place];  Service: General;  Laterality: N/A;  ? RIGHT/LEFT HEART CATH AND CORONARY ANGIOGRAPHY N/A 06/10/2018  ?  Procedure: RIGHT/LEFT HEART CATH AND CORONARY ANGIOGRAPHY;  Surgeon: Dixie Dials, MD;  Location: [place] CV LAB;  Service: Cardiovascular;  Laterality: N/A;  ? TEE WITHOUT CARDIOVERSION N/A 06/10/2018  ? Procedure: TRANSESOPHAGEAL ECHOCARDIOGRAM (TEE) with bubble study;  Surgeon: Dixie Dials, MD;  Location: [place] ENDOSCOPY;  Service: Cardiovascular;  Laterality: N/A;  ? TEE WITHOUT CARDIOVERSION N/A 07/18/2018  ? Procedure: TRANSESOPHAGEAL ECHOCARDIOGRAM (TEE);  Surgeon: Rexene Alberts, MD;  Location: [place];  Service: Open Heart Surgery;  Laterality: N/A;  ? TRICUSPID VALVE REPLACEMENT N/A 07/18/2018  ? Procedure: TRICUSPID VALVE REPAIR WITH EDWARDS MC3 TRICUSPID ANNULOPLASTY RING SIZE 30.;  Surgeon: Rexene Alberts, MD;  Location: [place];  Service: Open Heart Surgery;  Laterality: N/A;  ? ? ?Social History  ? ?Socioeconomic History  ? Marital status: Married  ?  Spouse name: Not on file  ? Number of children: Not on file  ? Years of education: Not on file  ? Highest education level: Not on file  ?Occupational History  ? Not on file  ?Tobacco Use  ? Smoking status: Former  ?  Packs/day: 2.00  ?  Years: 15.00  ?  Pack years: 30.00  ?  Types: Cigarettes  ?  Quit date: 11/26/1996  ?  Years since quitting: 25.3  ? Smokeless tobacco: Never  ?Vaping Use  ? Vaping Use: Never used  ?Substance and Sexual Activity  ? Alcohol use: No  ? Drug use: No  ? Sexual activity: Yes  ?Other Topics Concern  ? Not on file  ?Social History Narrative  ? Not on file  ? ?Social Determinants of Health  ? ?Financial Resource Strain: Not on file  ?Food Insecurity: Not on file  ?Transportation Needs: Not on file  ?Physical Activity: Not on file  ?Stress: Not on file  ?Social Connections: Not on file  ? ? ?Family History  ?Problem Relation Age of Onset  ? Heart disease Mother   ? Heart disease Father   ? Diabetes Father   ? Diabetes Brother   ? Breast cancer Maternal Aunt 29  ? Cancer Paternal Aunt   ?     unk type, tumor on head  ? Lung cancer  Cousin 16  ? Cancer Cousin 38  ? Colon cancer Neg Hx   ? Esophageal cancer Neg Hx   ? Stomach cancer Neg Hx   ? ? ?Health Maintenance  ?Topic Date Due  ? Zoster Vaccines- Shingrix (1 of 2) Never done  ? OPHTHALMOLOGY EXAM  06/05/2017  ? FOOT EXAM  11/28/2018  ? COVID-19 Vaccine (4 - Booster for Pfizer series) 01/09/2021  ? HEMOGLOBIN A1C  12/22/2021  ? Pneumonia Vaccine 12+ Years old (1 - PCV) 10/26/2022 (Originally 09/10/2021)  ? INFLUENZA VACCINE  06/26/2022  ? Fecal DNA (Cologuard)  07/26/2023  ? TETANUS/TDAP  04/21/2030  ?  Hepatitis C Screening  Completed  ? HIV Screening  Completed  ? HPV VACCINES  Aged Out  ? ? ? ?----------------------------------------------------------------------------------------------------------------------------------------------------------------------------------------------------------------- ?Physical Exam ?BP 107/67 (BP Location: Left Arm, Patient Position: Sitting, Cuff Size: Large)   Pulse 75   Ht '5\' 11"'$  (1.803 m)   Wt 270 lb (122.5 kg)   SpO2 95%   BMI 37.66 kg/m?  ? ?Physical Exam ?Constitutional:   ?   Appearance: Normal appearance.  ?Eyes:  ?   General: No scleral icterus. ?Cardiovascular:  ?   Rate and Rhythm: Normal rate and regular rhythm.  ?Pulmonary:  ?   Effort: Pulmonary effort is normal.  ?   Breath sounds: Wheezing (wheezing bilaterally.) present.  ?Musculoskeletal:  ?   Cervical back: Neck supple.  ?Skin: ?   Comments: Raised, hyperkeratotic lesion to dorsum of L hand.    ?Neurological:  ?   General: No focal deficit present.  ?   Mental Status: He is alert.  ?Psychiatric:     ?   Mood and Affect: Mood normal.     ?   Behavior: Behavior normal.  ? ? ?------------------------------------------------------------------------------------------------------------------------------------------------------------------------------------------------------------------- ?Assessment and Plan ? ?Longstanding persistent atrial fibrillation ([place]) ?Persistent a. Fib that is  rate controlled.  He is anticoagulated with warfarin.  He will continue management per cardiology.   ? ?Essential hypertension, benign ?BP is well controlled at this time.  ? ?COPD with chronic bronchitis ([place]) ?

## 2022-04-04 DIAGNOSIS — R7303 Prediabetes: Secondary | ICD-10-CM | POA: Diagnosis not present

## 2022-04-04 DIAGNOSIS — E782 Mixed hyperlipidemia: Secondary | ICD-10-CM | POA: Diagnosis not present

## 2022-04-05 LAB — CMP14+EGFR
ALT: 37 IU/L (ref 0–44)
AST: 19 IU/L (ref 0–40)
Albumin/Globulin Ratio: 2.3 — ABNORMAL HIGH (ref 1.2–2.2)
Albumin: 4.6 g/dL (ref 3.8–4.8)
Alkaline Phosphatase: 66 IU/L (ref 44–121)
BUN/Creatinine Ratio: 16 (ref 10–24)
BUN: 18 mg/dL (ref 8–27)
Bilirubin Total: 0.3 mg/dL (ref 0.0–1.2)
CO2: 23 mmol/L (ref 20–29)
Calcium: 9.3 mg/dL (ref 8.6–10.2)
Chloride: 102 mmol/L (ref 96–106)
Creatinine, Ser: 1.16 mg/dL (ref 0.76–1.27)
Globulin, Total: 2 g/dL (ref 1.5–4.5)
Glucose: 118 mg/dL — ABNORMAL HIGH (ref 70–99)
Potassium: 4.1 mmol/L (ref 3.5–5.2)
Sodium: 140 mmol/L (ref 134–144)
Total Protein: 6.6 g/dL (ref 6.0–8.5)
eGFR: 70 mL/min/{1.73_m2} (ref >59)

## 2022-04-05 LAB — LIPID PANEL
Chol/HDL Ratio: 5 ratio (ref 0.0–5.0)
Cholesterol, Total: 170 mg/dL (ref 100–199)
HDL: 34 mg/dL — ABNORMAL LOW (ref >39)
LDL Chol Calc (NIH): 100 mg/dL — ABNORMAL HIGH (ref 0–99)
Triglycerides: 210 mg/dL — ABNORMAL HIGH (ref 0–149)
VLDL Cholesterol Cal: 36 mg/dL (ref 5–40)

## 2022-04-05 LAB — HEMOGLOBIN A1C
Est. average glucose Bld gHb Est-mCnc: 103 mg/dL
Hgb A1c MFr Bld: 5.2 % (ref 4.8–5.6)

## 2022-04-24 ENCOUNTER — Ambulatory Visit (INDEPENDENT_AMBULATORY_CARE_PROVIDER_SITE_OTHER): Payer: Medicare Other | Admitting: *Deleted

## 2022-04-24 DIAGNOSIS — Z5181 Encounter for therapeutic drug level monitoring: Secondary | ICD-10-CM

## 2022-04-24 DIAGNOSIS — Z954 Presence of other heart-valve replacement: Secondary | ICD-10-CM

## 2022-04-24 DIAGNOSIS — I4891 Unspecified atrial fibrillation: Secondary | ICD-10-CM

## 2022-04-24 LAB — POCT INR: INR: 3.2 — AB (ref 2.0–3.0)

## 2022-04-24 NOTE — Patient Instructions (Signed)
Continue warfarin 1 tablet daily except 1 1/2 tablets on Mondays and Thursdays. Recheck in 6 wks

## 2022-06-05 ENCOUNTER — Ambulatory Visit: Payer: Medicare Other | Admitting: *Deleted

## 2022-06-05 DIAGNOSIS — Z5181 Encounter for therapeutic drug level monitoring: Secondary | ICD-10-CM

## 2022-06-05 DIAGNOSIS — I4891 Unspecified atrial fibrillation: Secondary | ICD-10-CM | POA: Diagnosis not present

## 2022-06-05 DIAGNOSIS — Z954 Presence of other heart-valve replacement: Secondary | ICD-10-CM | POA: Diagnosis not present

## 2022-06-05 LAB — POCT INR: INR: 4.1 — AB (ref 2.0–3.0)

## 2022-06-05 NOTE — Patient Instructions (Signed)
Hold warfarin tonight then resume 1 tablet daily except 1 1/2 tablets on Mondays and Thursdays. Recheck in 3 wks

## 2022-06-09 ENCOUNTER — Other Ambulatory Visit: Payer: Self-pay | Admitting: Physician Assistant

## 2022-06-14 ENCOUNTER — Other Ambulatory Visit: Payer: Self-pay

## 2022-06-14 MED ORDER — ATORVASTATIN CALCIUM 40 MG PO TABS: 40.0000 mg | ORAL_TABLET | Freq: Every evening | ORAL | 3 refills | Status: DC

## 2022-06-21 ENCOUNTER — Ambulatory Visit: Payer: Medicare Other | Admitting: Cardiology

## 2022-06-26 ENCOUNTER — Ambulatory Visit: Payer: Medicare Other | Admitting: *Deleted

## 2022-06-26 DIAGNOSIS — I4891 Unspecified atrial fibrillation: Secondary | ICD-10-CM | POA: Diagnosis not present

## 2022-06-26 DIAGNOSIS — Z954 Presence of other heart-valve replacement: Secondary | ICD-10-CM

## 2022-06-26 DIAGNOSIS — Z5181 Encounter for therapeutic drug level monitoring: Secondary | ICD-10-CM

## 2022-06-26 LAB — POCT INR: INR: 3 (ref 2.0–3.0)

## 2022-06-26 NOTE — Patient Instructions (Signed)
Continue warfarin 1 tablet daily except 1 1/2 tablets on Mondays and Thursdays. Recheck in 4 wks

## 2022-07-18 ENCOUNTER — Encounter: Payer: Self-pay | Admitting: General Practice

## 2022-07-24 ENCOUNTER — Ambulatory Visit: Payer: Medicare Other | Attending: Cardiology | Admitting: *Deleted

## 2022-07-24 DIAGNOSIS — Z954 Presence of other heart-valve replacement: Secondary | ICD-10-CM

## 2022-07-24 DIAGNOSIS — Z5181 Encounter for therapeutic drug level monitoring: Secondary | ICD-10-CM

## 2022-07-24 DIAGNOSIS — I4891 Unspecified atrial fibrillation: Secondary | ICD-10-CM | POA: Diagnosis not present

## 2022-07-24 LAB — POCT INR: INR: 3.8 — AB (ref 2.0–3.0)

## 2022-07-24 NOTE — Patient Instructions (Signed)
Hold warfarin tonight then resume 1 tablet daily except 1 1/2 tablets on Mondays and Thursdays. Recheck in 4 wks

## 2022-08-09 ENCOUNTER — Other Ambulatory Visit: Payer: Self-pay | Admitting: Cardiology

## 2022-08-13 ENCOUNTER — Encounter: Payer: Self-pay | Admitting: Family Medicine

## 2022-08-13 ENCOUNTER — Ambulatory Visit (INDEPENDENT_AMBULATORY_CARE_PROVIDER_SITE_OTHER): Payer: Medicare Other | Admitting: Family Medicine

## 2022-08-13 DIAGNOSIS — L259 Unspecified contact dermatitis, unspecified cause: Secondary | ICD-10-CM | POA: Insufficient documentation

## 2022-08-13 DIAGNOSIS — L239 Allergic contact dermatitis, unspecified cause: Secondary | ICD-10-CM

## 2022-08-13 MED ORDER — PREDNISONE 5 MG (48) PO TBPK: ORAL_TABLET | ORAL | 0 refills | Status: DC

## 2022-08-13 NOTE — Progress Notes (Signed)
Jeffrey Larson - 66 y.o. male MRN [id number]  Date of birth: 07/01/1956  Subjective Chief Complaint  Patient presents with   Rash    HPI Jeffrey Larson is a 66 y.o. male here today with complaint of rash.  Rash located on bilateral arms, trunk and cheek.  Thinks that he came in to contact with poison ivy.  Symptoms started about 1 week ago.  Rash is itchy.  Denies pain or drainage.  Has not tried anything to manage or treat this.   ROS:  A comprehensive ROS was completed and negative except as noted per HPI  Allergies  Allergen Reactions   Ramipril Cough    cough   Percocet [Oxycodone-Acetaminophen]     In ICU it contributed to hallucinations. Can take by itself    Past Medical History:  Diagnosis Date   Atrial fibrillation ([place])    hx of   Cancer ([place])    skin cancer removed from arm dec 2021 melanoma   Chronic combined systolic and diastolic congestive heart failure ([place])    COPD with chronic bronchitis ([place])    Coronary artery disease    COVID-19 virus infection 06/2020   all symptoms resolved   Family history of breast cancer    Family history of breast cancer    Family history of lung cancer    History of kidney stones    Hyperlipidemia    Hypertension    Longstanding persistent atrial fibrillation ([facility])    Mitral stenosis with regurgitation    Myocardial infarction ([place]) 2015   stents x 2   Obesity    Personal history of colonic polyps    Pneumonia 2015 and 06-2020   Pulmonary hypertension ([facility])    S/P Maze operation for atrial fibrillation 07/18/2018   Complete bilateral atrial lesion set using bipolar radiofrequency and cryothermy ablation with clipping of LA appendage   S/P mitral valve replacement with metallic valve 9/93/5701   Sorin Carbomedics Optiform bileaflet mechanical valve, size 33 mm   S/P TVR (tricuspid valve repair) 07/18/2018   Edwards mc3 ring annuloplasty, size 30 mm   Wears partial dentures    upper and lower    Past Surgical  History:  Procedure Laterality Date   APPENDECTOMY     as a child   CARDIOVASCULAR STRESS TEST  03/2014   Borderline reversible ischemic changes at the apex.  Normal LV contractility/EF 52%.   CORONARY STENT PLACEMENT  7/12013   LEFT HEART CATHETERIZATION WITH CORONARY ANGIOGRAM N/A 06/12/2012   Procedure: LEFT HEART CATHETERIZATION WITH CORONARY ANGIOGRAM;  Surgeon: Clent Demark, MD;  Location: [place] CATH LAB;  Service: Cardiovascular;  Laterality: N/A;   MAZE N/A 07/18/2018   Procedure: MAZE;  Surgeon: Rexene Alberts, MD;  Location: [place];  Service: Open Heart Surgery;  Laterality: N/A;   MITRAL VALVE REPLACEMENT N/A 07/18/2018   Procedure: MITRAL VALVE (MV) REPLACEMENT WITH CARBOMEDICS OPTIFORM MITRAL VALVE SIZE 33.;  Surgeon: Rexene Alberts, MD;  Location: [place];  Service: Open Heart Surgery;  Laterality: N/A;   PERCUTANEOUS CORONARY STENT INTERVENTION (PCI-S) N/A 06/17/2012   Procedure: PERCUTANEOUS CORONARY STENT INTERVENTION (PCI-S);  Surgeon: Clent Demark, MD;  Location: [facility] CATH LAB;  Service: Cardiovascular;  Laterality: N/A;   RECTAL EXAM UNDER ANESTHESIA N/A 12/15/2020   Procedure: ANORECTAL EXAM UNDER ANESTHESIA EXCISION OF ANAL CANAL POLYP;  Surgeon: Ileana Roup, MD;  Location: [place];  Service: General;  Laterality: N/A;   RIGHT/LEFT HEART CATH  AND CORONARY ANGIOGRAPHY N/A 06/10/2018   Procedure: RIGHT/LEFT HEART CATH AND CORONARY ANGIOGRAPHY;  Surgeon: Dixie Dials, MD;  Location: [place] CV LAB;  Service: Cardiovascular;  Laterality: N/A;   TEE WITHOUT CARDIOVERSION N/A 06/10/2018   Procedure: TRANSESOPHAGEAL ECHOCARDIOGRAM (TEE) with bubble study;  Surgeon: Dixie Dials, MD;  Location: [facility] ENDOSCOPY;  Service: Cardiovascular;  Laterality: N/A;   TEE WITHOUT CARDIOVERSION N/A 07/18/2018   Procedure: TRANSESOPHAGEAL ECHOCARDIOGRAM (TEE);  Surgeon: Rexene Alberts, MD;  Location: [place];  Service: Open Heart Surgery;  Laterality: N/A;   TRICUSPID  VALVE REPLACEMENT N/A 07/18/2018   Procedure: TRICUSPID VALVE REPAIR WITH EDWARDS MC3 TRICUSPID ANNULOPLASTY RING SIZE 30.;  Surgeon: Rexene Alberts, MD;  Location: [place];  Service: Open Heart Surgery;  Laterality: N/A;    Social History   Socioeconomic History   Marital status: Married    Spouse name: Not on file   Number of children: Not on file   Years of education: Not on file   Highest education level: Not on file  Occupational History   Not on file  Tobacco Use   Smoking status: Former    Packs/day: 2.00    Years: 15.00    Total pack years: 30.00    Types: Cigarettes    Quit date: 11/26/1996    Years since quitting: 25.7   Smokeless tobacco: Never  Vaping Use   Vaping Use: Never used  Substance and Sexual Activity   Alcohol use: No   Drug use: No   Sexual activity: Yes  Other Topics Concern   Not on file  Social History Narrative   Not on file   Social Determinants of Health   Financial Resource Strain: Not on file  Food Insecurity: Not on file  Transportation Needs: Not on file  Physical Activity: Not on file  Stress: Not on file  Social Connections: Not on file    Family History  Problem Relation Age of Onset   Heart disease Mother    Heart disease Father    Diabetes Father    Diabetes Brother    Breast cancer Maternal Aunt 77   Cancer Paternal Aunt        unk type, tumor on head   Lung cancer Cousin 71   Cancer Cousin 38   Colon cancer Neg Hx    Esophageal cancer Neg Hx    Stomach cancer Neg Hx     Health Maintenance  Topic Date Due   Pneumonia Vaccine 83+ Years old (1 - PCV) 10/26/2022 (Originally 09/10/2021)   Diabetic kidney evaluation - Urine ACR  12/27/2022 (Originally 09/10/1974)   OPHTHALMOLOGY EXAM  12/27/2022 (Originally 06/05/2017)   COVID-19 Vaccine (4 - Pfizer risk series) 12/27/2022 (Originally 01/09/2021)   Zoster Vaccines- Shingrix (1 of 2) 12/27/2022 (Originally 09/11/1975)   FOOT EXAM  02/14/2023 (Originally 11/28/2018)    INFLUENZA VACCINE  02/24/2023 (Originally 06/26/2022)   HEMOGLOBIN A1C  10/05/2022   Diabetic kidney evaluation - GFR measurement  04/05/2023   Fecal DNA (Cologuard)  07/26/2023   TETANUS/TDAP  04/21/2030   Hepatitis C Screening  Completed   HIV Screening  Completed   HPV VACCINES  Aged Out     ----------------------------------------------------------------------------------------------------------------------------------------------------------------------------------------------------------------- Physical Exam BP 110/72 (BP Location: Right Arm, Patient Position: Sitting, Cuff Size: Large)   Pulse 79   Ht '5\' 11"'$  (1.803 m)   Wt 279 lb (126.6 kg)   SpO2 97%   BMI 38.91 kg/m   Physical Exam Constitutional:  Appearance: Normal appearance.  Musculoskeletal:     Cervical back: Neck supple.     Comments: Rash with erythematous patches on arms and face.  Some flaking/scaling of skin around these areas.  No drainage or tenderness to palpation.    Neurological:     Mental Status: He is alert.  Psychiatric:        Mood and Affect: Mood normal.        Behavior: Behavior normal.     ------------------------------------------------------------------------------------------------------------------------------------------------------------------------------------------------------------------- Assessment and Plan  Contact dermatitis Given extensive involvement over the body and facial involvement I will have him start prednisone taper.  Will do 12 days to prevent rebound.  Contact clinic if not improving or for signs of infection.     Meds ordered this encounter  Medications   predniSONE (STERAPRED UNI-PAK 48 TAB) 5 MG (48) TBPK tablet    Sig: 12 day taper.  Take as directed on packaging.    Dispense:  48 tablet    Refill:  0    No follow-ups on file.    This visit occurred during the SARS-CoV-2 public health emergency.  Safety protocols were in place, including screening  questions prior to the visit, additional usage of staff PPE, and extensive cleaning of exam room while observing appropriate contact time as indicated for disinfecting solutions.

## 2022-08-13 NOTE — Assessment & Plan Note (Signed)
Given extensive involvement over the body and facial involvement I will have him start prednisone taper.  Will do 12 days to prevent rebound.  Contact clinic if not improving or for signs of infection.

## 2022-08-13 NOTE — Patient Instructions (Signed)
Contact Dermatitis Dermatitis is redness, soreness, and swelling (inflammation) of the skin. Contact dermatitis is a reaction to something that touches the skin. There are two types of contact dermatitis: Irritant contact dermatitis. This happens when something bothers (irritates) your skin, like soap. Allergic contact dermatitis. This is caused when you are exposed to something that you are allergic to, such as poison ivy. What are the causes? Common causes of irritant contact dermatitis include: Makeup. Soaps. Detergents. Bleaches. Acids. Metals, such as nickel. Common causes of allergic contact dermatitis include: Plants. Chemicals. Jewelry. Latex. Medicines. Preservatives in products, such as clothing. What increases the risk? Having a job that exposes you to things that bother your skin. Having asthma or eczema. What are the signs or symptoms? Symptoms may happen anywhere the irritant has touched your skin. Symptoms include: Dry or flaky skin. Redness. Cracks. Itching. Pain or a burning feeling. Blisters. Blood or clear fluid draining from skin cracks. With allergic contact dermatitis, swelling may occur. This may happen in places such as the eyelids, mouth, or genitals. How is this treated? This condition is treated by checking for the cause of the reaction and protecting your skin. Treatment may also include: Steroid creams, ointments, or medicines. Antibiotic medicines or other ointments, if you have a skin infection. Lotion or medicines to help with itching. A bandage (dressing). Follow these instructions at home: Skin care Moisturize your skin as needed. Put cool cloths on your skin. Put a baking soda paste on your skin. Stir water into baking soda until it looks like a paste. Do not scratch your skin. Avoid having things rub up against your skin. Avoid the use of soaps, perfumes, and dyes. Medicines Take or apply over-the-counter and prescription medicines  only as told by your doctor. If you were prescribed an antibiotic medicine, take or apply it as told by your doctor. Do not stop using it even if your condition starts to get better. Bathing Take a bath with: Epsom salts. Baking soda. Colloidal oatmeal. Bathe less often. Bathe in warm water. Avoid using hot water. Bandage care If you were given a bandage, change it as told by your doctor. Wash your hands with soap and water before and after you change your bandage. If soap and water are not available, use hand sanitizer. General instructions Avoid the things that caused your reaction. If you do not know what caused it, keep a journal. Write down: What you eat. What skin products you use. What you drink. What you wear in the area that has symptoms. This includes jewelry. Check the affected areas every day for signs of infection. Check for: More redness, swelling, or pain. More fluid or blood. Warmth. Pus or a bad smell. Keep all follow-up visits as told by your doctor. This is important. Contact a doctor if: You do not get better with treatment. Your condition gets worse. You have signs of infection, such as: More swelling. Tenderness. More redness. Soreness. Warmth. You have a fever. You have new symptoms. Get help right away if: You have a very bad headache. You have neck pain. Your neck is stiff. You throw up (vomit). You feel very sleepy. You see red streaks coming from the area. Your bone or joint near the area hurts after the skin has healed. The area turns darker. You have trouble breathing. Summary Dermatitis is redness, soreness, and swelling of the skin. Symptoms may occur where the irritant has touched you. Treatment may include medicines and skin care. If you do not  know what caused your reaction, keep a journal. Contact a doctor if your condition gets worse or you have signs of infection. This information is not intended to replace advice given to you by  your health care provider. Make sure you discuss any questions you have with your health care provider. Document Revised: 08/28/2021 Document Reviewed: 08/28/2021 Elsevier Patient Education  [place].

## 2022-08-24 ENCOUNTER — Ambulatory Visit: Payer: Medicare Other | Attending: Cardiology | Admitting: *Deleted

## 2022-08-24 DIAGNOSIS — Z954 Presence of other heart-valve replacement: Secondary | ICD-10-CM

## 2022-08-24 DIAGNOSIS — I4891 Unspecified atrial fibrillation: Secondary | ICD-10-CM

## 2022-08-24 DIAGNOSIS — Z5181 Encounter for therapeutic drug level monitoring: Secondary | ICD-10-CM

## 2022-08-24 LAB — POCT INR: INR: 2.6 (ref 2.0–3.0)

## 2022-08-24 NOTE — Patient Instructions (Signed)
Continue warfarin 1 tablet daily except 1 1/2 tablets on Mondays and Thursdays. Recheck in 4 wks

## 2022-08-27 ENCOUNTER — Telehealth: Payer: Self-pay | Admitting: Cardiology

## 2022-08-27 MED ORDER — WARFARIN SODIUM 4 MG PO TABS: ORAL_TABLET | ORAL | 1 refills | Status: DC

## 2022-08-27 NOTE — Telephone Encounter (Signed)
*  STAT* If patient is at the pharmacy, call can be transferred to refill team.   1. Which medications need to be refilled? (please list name of each medication and dose if known) warfarin (COUMADIN) 4 MG tablet  2. Which pharmacy/location (including street and city if local pharmacy) is medication to be sent to? [facility] [place], [address]  3. Do they need a 30 day or 90 day supply? 90 day  Patient is completely out of medication

## 2022-08-28 ENCOUNTER — Other Ambulatory Visit: Payer: Self-pay | Admitting: Cardiology

## 2022-09-06 ENCOUNTER — Other Ambulatory Visit: Payer: Self-pay | Admitting: *Deleted

## 2022-09-06 MED ORDER — ENTRESTO 24-26 MG PO TABS: 1.0000 | ORAL_TABLET | Freq: Two times a day (BID) | ORAL | 3 refills | Status: DC

## 2022-09-11 ENCOUNTER — Telehealth: Payer: Self-pay | Admitting: *Deleted

## 2022-09-11 NOTE — Telephone Encounter (Signed)
Received fax from [employer] - patient approved for assistance with Entresto until 11/25/2022.

## 2022-09-13 ENCOUNTER — Telehealth: Payer: Self-pay | Admitting: Cardiology

## 2022-09-13 NOTE — Telephone Encounter (Signed)
Pt c/o medication issue:  1. Name of Medication: sacubitril-valsartan (ENTRESTO) 24-26 MG  2. How are you currently taking this medication (dosage and times per day)? Take 1 tablet by mouth 2 (two) times daily.  3. Are you having a reaction (difficulty breathing--STAT)? no  4. What is your medication issue? Patient would be okay of medication on Friday. And his new wouldn't come till Tuesday 09/18/22. Calling to see if can get any samples until then. Please advise

## 2022-09-13 NOTE — Telephone Encounter (Signed)
Left message for patient to call office back regarding sample of Entresto 24-26 mg tablets.

## 2022-09-14 NOTE — Telephone Encounter (Signed)
Patient is returning call.  °

## 2022-09-14 NOTE — Telephone Encounter (Signed)
Pt coming to the [place] office for sample bottle of Entresto 24-26 mg tablets.

## 2022-09-14 NOTE — Telephone Encounter (Signed)
Noted, medication at front office

## 2022-09-20 ENCOUNTER — Ambulatory Visit: Payer: Medicare Other | Attending: Cardiology | Admitting: *Deleted

## 2022-09-20 DIAGNOSIS — Z954 Presence of other heart-valve replacement: Secondary | ICD-10-CM

## 2022-09-20 DIAGNOSIS — I4891 Unspecified atrial fibrillation: Secondary | ICD-10-CM | POA: Diagnosis not present

## 2022-09-20 DIAGNOSIS — Z5181 Encounter for therapeutic drug level monitoring: Secondary | ICD-10-CM | POA: Diagnosis not present

## 2022-09-20 LAB — POCT INR: POC INR: 6

## 2022-09-20 NOTE — Patient Instructions (Addendum)
Description   Hold Warfarin today (10/26) and tomorrow (10/27) .  On 10/28 take 1/2 a tablet ('2mg'$ ).  Then continue to take warfarin 1 tablet daily except for 1.5  tablets on Mondays and Thursdays. Recheck INR in 1 week.

## 2022-09-25 ENCOUNTER — Ambulatory Visit: Payer: Medicare Other | Attending: Internal Medicine | Admitting: *Deleted

## 2022-09-25 DIAGNOSIS — I4891 Unspecified atrial fibrillation: Secondary | ICD-10-CM | POA: Diagnosis not present

## 2022-09-25 DIAGNOSIS — Z5181 Encounter for therapeutic drug level monitoring: Secondary | ICD-10-CM

## 2022-09-25 DIAGNOSIS — Z954 Presence of other heart-valve replacement: Secondary | ICD-10-CM | POA: Diagnosis not present

## 2022-09-25 LAB — POCT INR: INR: 1.7 — AB (ref 2.0–3.0)

## 2022-09-25 NOTE — Patient Instructions (Signed)
Take warfarin 2 tablets tonight then resume 1 tablet daily except for 1.5  tablets on Mondays and Thursdays. Recheck INR in 3 week.

## 2022-10-03 ENCOUNTER — Ambulatory Visit (INDEPENDENT_AMBULATORY_CARE_PROVIDER_SITE_OTHER): Payer: Medicare Other | Admitting: Family Medicine

## 2022-10-03 ENCOUNTER — Encounter: Payer: Self-pay | Admitting: Family Medicine

## 2022-10-03 VITALS — BP 104/67 | HR 65 | Ht 71.0 in | Wt 279.0 lb

## 2022-10-03 DIAGNOSIS — I4811 Longstanding persistent atrial fibrillation: Secondary | ICD-10-CM | POA: Diagnosis not present

## 2022-10-03 DIAGNOSIS — Z23 Encounter for immunization: Secondary | ICD-10-CM

## 2022-10-03 DIAGNOSIS — I10 Essential (primary) hypertension: Secondary | ICD-10-CM | POA: Diagnosis not present

## 2022-10-03 DIAGNOSIS — D485 Neoplasm of uncertain behavior of skin: Secondary | ICD-10-CM

## 2022-10-03 DIAGNOSIS — I5042 Chronic combined systolic (congestive) and diastolic (congestive) heart failure: Secondary | ICD-10-CM

## 2022-10-03 DIAGNOSIS — E782 Mixed hyperlipidemia: Secondary | ICD-10-CM

## 2022-10-03 NOTE — Assessment & Plan Note (Signed)
Continue atorvastatin at current strength.  

## 2022-10-03 NOTE — Assessment & Plan Note (Signed)
Continues to see cardiology.  Stable at this time.  Continue metoprolol for rate control. Coumadin monitoring done by cardiology.

## 2022-10-03 NOTE — Assessment & Plan Note (Signed)
Remains on entresto and torsemide.  Euvolemic at this time, denies symptoms.

## 2022-10-03 NOTE — Assessment & Plan Note (Signed)
Area with appearance of BCC.  Referral placed to dermatology.

## 2022-10-03 NOTE — Assessment & Plan Note (Signed)
BP is well controlled at this time.  Recommend continuation of current medications.

## 2022-10-03 NOTE — Progress Notes (Signed)
Jeffrey Larson - 66 y.o. male MRN [id number]  Date of birth: 1956/08/04  Subjective Chief Complaint  Patient presents with   Hypertension    HPI Jeffrey Larson is a 66 y.o. male here today for follow up visit.    History of MV replacement.  Remains on warfarin for anticoagulation.  Doing well at this time.  Followed by cardiology.  Remains on metoprolol and entresto.  BP is well controlled.  Denies side effects or hypotension.  Has not had chest pain, shortness of breath, palpitations, headache.  Remains on atorvastatin for HLD. Tolerating this well.   Has skin lesion on shoulder that is not healing.  Denies pain/itching.   ROS:  A comprehensive ROS was completed and negative except as noted per HPI  Allergies  Allergen Reactions   Ramipril Cough    cough   Percocet [Oxycodone-Acetaminophen]     In ICU it contributed to hallucinations. Can take by itself    Past Medical History:  Diagnosis Date   Atrial fibrillation ([place])    hx of   Cancer ([place])    skin cancer removed from arm dec 2021 melanoma   Chronic combined systolic and diastolic congestive heart failure ([place])    COPD with chronic bronchitis    Coronary artery disease    COVID-19 virus infection 06/2020   all symptoms resolved   Family history of breast cancer    Family history of breast cancer    Family history of lung cancer    History of kidney stones    Hyperlipidemia    Hypertension    Longstanding persistent atrial fibrillation (HCC)    Mitral stenosis with regurgitation    Myocardial infarction ([place]) 2015   stents x 2   Obesity    Personal history of colonic polyps    Pneumonia 2015 and 06-2020   Pulmonary hypertension (HCC)    S/P Maze operation for atrial fibrillation 07/18/2018   Complete bilateral atrial lesion set using bipolar radiofrequency and cryothermy ablation with clipping of LA appendage   S/P mitral valve replacement with metallic valve 03/04/8118   Sorin Carbomedics Optiform  bileaflet mechanical valve, size 33 mm   S/P TVR (tricuspid valve repair) 07/18/2018   Edwards mc3 ring annuloplasty, size 30 mm   Wears partial dentures    upper and lower    Past Surgical History:  Procedure Laterality Date   APPENDECTOMY     as a child   CARDIOVASCULAR STRESS TEST  03/2014   Borderline reversible ischemic changes at the apex.  Normal LV contractility/EF 52%.   CORONARY STENT PLACEMENT  7/12013   LEFT HEART CATHETERIZATION WITH CORONARY ANGIOGRAM N/A 06/12/2012   Procedure: LEFT HEART CATHETERIZATION WITH CORONARY ANGIOGRAM;  Surgeon: Clent Demark, MD;  Location: [facility] CATH LAB;  Service: Cardiovascular;  Laterality: N/A;   MAZE N/A 07/18/2018   Procedure: MAZE;  Surgeon: Rexene Alberts, MD;  Location: [facility];  Service: Open Heart Surgery;  Laterality: N/A;   MITRAL VALVE REPLACEMENT N/A 07/18/2018   Procedure: MITRAL VALVE (MV) REPLACEMENT WITH CARBOMEDICS OPTIFORM MITRAL VALVE SIZE 33.;  Surgeon: Rexene Alberts, MD;  Location: [facility];  Service: Open Heart Surgery;  Laterality: N/A;   PERCUTANEOUS CORONARY STENT INTERVENTION (PCI-S) N/A 06/17/2012   Procedure: PERCUTANEOUS CORONARY STENT INTERVENTION (PCI-S);  Surgeon: Clent Demark, MD;  Location: [facility] CATH LAB;  Service: Cardiovascular;  Laterality: N/A;   RECTAL EXAM UNDER ANESTHESIA N/A 12/15/2020   Procedure: ANORECTAL EXAM UNDER ANESTHESIA EXCISION  OF ANAL CANAL POLYP;  Surgeon: Ileana Roup, MD;  [facility];  Service: General;  Laterality: N/A;   RIGHT/LEFT HEART CATH AND CORONARY ANGIOGRAPHY N/A 06/10/2018   Procedure: RIGHT/LEFT HEART CATH AND CORONARY ANGIOGRAPHY;  Surgeon: Dixie Dials, MD;  Location: [facility] CV LAB;  Service: Cardiovascular;  Laterality: N/A;   TEE WITHOUT CARDIOVERSION N/A 06/10/2018   Procedure: TRANSESOPHAGEAL ECHOCARDIOGRAM (TEE) with bubble study;  Surgeon: Dixie Dials, MD;  Location: [facility] ENDOSCOPY;  Service: Cardiovascular;  Laterality: N/A;   TEE  WITHOUT CARDIOVERSION N/A 07/18/2018   Procedure: TRANSESOPHAGEAL ECHOCARDIOGRAM (TEE);  Surgeon: Rexene Alberts, MD;  Location: [place];  Service: Open Heart Surgery;  Laterality: N/A;   TRICUSPID VALVE REPLACEMENT N/A 07/18/2018   Procedure: TRICUSPID VALVE REPAIR WITH EDWARDS MC3 TRICUSPID ANNULOPLASTY RING SIZE 30.;  Surgeon: Rexene Alberts, MD;  Location: [place];  Service: Open Heart Surgery;  Laterality: N/A;    Social History   Socioeconomic History   Marital status: Married    Spouse name: Not on file   Number of children: Not on file   Years of education: Not on file   Highest education level: Not on file  Occupational History   Not on file  Tobacco Use   Smoking status: Former    Packs/day: 2.00    Years: 15.00    Total pack years: 30.00    Types: Cigarettes    Quit date: 11/26/1996    Years since quitting: 25.8   Smokeless tobacco: Never  Vaping Use   Vaping Use: Never used  Substance and Sexual Activity   Alcohol use: No   Drug use: No   Sexual activity: Yes  Other Topics Concern   Not on file  Social History Narrative   Not on file   Social Determinants of Health   Financial Resource Strain: Not on file  Food Insecurity: Not on file  Transportation Needs: Not on file  Physical Activity: Not on file  Stress: Not on file  Social Connections: Not on file    Family History  Problem Relation Age of Onset   Heart disease Mother    Heart disease Father    Diabetes Father    Diabetes Brother    Breast cancer Maternal Aunt 55   Cancer Paternal Aunt        unk type, tumor on head   Lung cancer Cousin 71   Cancer Cousin 38   Colon cancer Neg Hx    Esophageal cancer Neg Hx    Stomach cancer Neg Hx     Health Maintenance  Topic Date Due   Pneumonia Vaccine 101+ Years old (1 - PCV) 10/26/2022 (Originally 09/10/2021)   Diabetic kidney evaluation - Urine ACR  12/27/2022 (Originally 09/10/1974)   OPHTHALMOLOGY EXAM  12/27/2022 (Originally 06/05/2017)    COVID-19 Vaccine (4 - Pfizer risk series) 12/27/2022 (Originally 01/09/2021)   Medicare Annual Wellness (AWV)  12/27/2022 (Originally [birth year])   Zoster Vaccines- Shingrix (1 of 2) 12/27/2022 (Originally 09/11/1975)   FOOT EXAM  02/14/2023 (Originally 11/28/2018)   INFLUENZA VACCINE  02/24/2023 (Originally 06/26/2022)   HEMOGLOBIN A1C  10/05/2022   Diabetic kidney evaluation - GFR measurement  04/05/2023   Fecal DNA (Cologuard)  07/26/2023   TETANUS/TDAP  04/21/2030   Hepatitis C Screening  Completed   HPV VACCINES  Aged Out     ----------------------------------------------------------------------------------------------------------------------------------------------------------------------------------------------------------------- Physical Exam BP 104/67 (BP Location: Left Arm, Patient Position: Sitting, Cuff Size: Large)   Pulse 65  Ht '5\' 11"'$  (1.803 m)   Wt 279 lb (126.6 kg)   SpO2 98%   BMI 38.91 kg/m   Physical Exam Constitutional:      Appearance: Normal appearance.  HENT:     Head: Normocephalic and atraumatic.  Eyes:     General: No scleral icterus. Cardiovascular:     Rate and Rhythm: Normal rate and regular rhythm.  Pulmonary:     Effort: Pulmonary effort is normal.     Breath sounds: Normal breath sounds.  Musculoskeletal:     Cervical back: Neck supple.  Skin:    Comments: Scabbed over lesion of L shoulder.  Slightly raised borders.    Neurological:     Mental Status: He is alert.  Psychiatric:        Mood and Affect: Mood normal.        Behavior: Behavior normal.     ------------------------------------------------------------------------------------------------------------------------------------------------------------------------------------------------------------------- Assessment and Plan  Longstanding persistent atrial fibrillation ([place]) Continues to see cardiology.  Stable at this time.  Continue metoprolol for rate control. Coumadin monitoring  done by cardiology.    Essential hypertension, benign BP is well controlled at this time.  Recommend continuation of current medications.    Chronic combined systolic and diastolic heart failure (HCC) Remains on entresto and torsemide.  Euvolemic at this time, denies symptoms.    Hyperlipidemia Continue atorvastatin at current strength.   Neoplasm of uncertain behavior of skin Area with appearance of BCC.  Referral placed to dermatology.     No orders of the defined types were placed in this encounter.   Return in about 6 months (around 04/03/2023) for Annual exam/fasting labs.    This visit occurred during the SARS-CoV-2 public health emergency.  Safety protocols were in place, including screening questions prior to the visit, additional usage of staff PPE, and extensive cleaning of exam room while observing appropriate contact time as indicated for disinfecting solutions.

## 2022-10-16 ENCOUNTER — Ambulatory Visit: Payer: Medicare Other | Attending: Internal Medicine | Admitting: *Deleted

## 2022-10-16 DIAGNOSIS — I4891 Unspecified atrial fibrillation: Secondary | ICD-10-CM

## 2022-10-16 DIAGNOSIS — Z954 Presence of other heart-valve replacement: Secondary | ICD-10-CM | POA: Diagnosis not present

## 2022-10-16 DIAGNOSIS — Z5181 Encounter for therapeutic drug level monitoring: Secondary | ICD-10-CM

## 2022-10-16 LAB — POCT INR: INR: 3.7 — AB (ref 2.0–3.0)

## 2022-10-16 NOTE — Patient Instructions (Signed)
Hold warfarin tonight then resume 2 tablets tonight then resume 1 tablet daily except for 1.5  tablets on Mondays and Thursdays. Recheck INR in 4 weeks.

## 2022-11-07 DIAGNOSIS — L570 Actinic keratosis: Secondary | ICD-10-CM | POA: Diagnosis not present

## 2022-11-07 DIAGNOSIS — X32XXXA Exposure to sunlight, initial encounter: Secondary | ICD-10-CM | POA: Diagnosis not present

## 2022-11-07 DIAGNOSIS — C44519 Basal cell carcinoma of skin of other part of trunk: Secondary | ICD-10-CM | POA: Diagnosis not present

## 2022-11-13 ENCOUNTER — Ambulatory Visit: Payer: Medicare Other | Attending: Cardiology | Admitting: *Deleted

## 2022-11-13 DIAGNOSIS — I4891 Unspecified atrial fibrillation: Secondary | ICD-10-CM

## 2022-11-13 DIAGNOSIS — Z5181 Encounter for therapeutic drug level monitoring: Secondary | ICD-10-CM

## 2022-11-13 DIAGNOSIS — Z954 Presence of other heart-valve replacement: Secondary | ICD-10-CM

## 2022-11-13 LAB — POCT INR: INR: 4.6 — AB (ref 2.0–3.0)

## 2022-11-13 NOTE — Patient Instructions (Signed)
Hold warfarin tonight then decrease dose to 1 tablet daily except for 1.5  tablets on Mondays  Recheck in 3 wks

## 2022-11-30 ENCOUNTER — Telehealth: Payer: Self-pay | Admitting: *Deleted

## 2022-11-30 NOTE — Telephone Encounter (Signed)
Received fax from [facility] - Entresto approved at no cost to patient until 11/26/2023.

## 2022-12-04 ENCOUNTER — Ambulatory Visit: Payer: Medicare Other | Admitting: Cardiology

## 2022-12-04 ENCOUNTER — Ambulatory Visit: Payer: Medicare Other

## 2022-12-04 NOTE — Progress Notes (Deleted)
Clinical Summary Jeffrey Larson is a 67 y.o.male  seen today for follow up of the following medical problems.      1. History of MVR for rheumatic mitral stenosis - On 07/18/2018 he underwent mitral valve replacement with mechanical mitral valve, tricuspid valve repair using an Edwards mc3 ring annuloplasty , and Maze procedure -Sorin Carbomedics Optiform bileaflet 33 mm mechanical valve on 07/18/2018.    - Jan 2020 echo LVEF 55-60%, normal MVR     No SOB/DOE, no LE edema - no bleeding on coumadin.      2. Permanent afib -no palpitatoins.    3. CAD - MI in 2013 with stenting   - He underwent cardiac catheterization on 06/10/2018.  He had nonobstructive disease with a mid RCA 50% stenosis and otherwise 20% stenosis seen in the proximal left circumflex and ostial to proximal LAD. - denies any chest pain.   4. Chronic diastolic HF - taking torsemide '20mg'$  daily, denies any LE edema  - had some prior LV dysfunction, 05/2018 TEE LVEF 35-40% - Jan 2020 echo 55-60%     5. COPD     6. COVID in 06/2019   7. Pulmonary HTN - has been on sildenafil per CHF clinic   8. HTN - he remains compliant with med   9. Hyperlipidemia - LDL has been at goal, was 61 in 05/2020  - lbas followed by pcp, he is on atorvastatin '40mg'$  daily   05/2021 TC 113 TG 153 HDL 29 LDL 60   Past Medical History:  Diagnosis Date   Atrial fibrillation (HCC)    hx of   Cancer ([place])    skin cancer removed from arm dec 2021 melanoma   Chronic combined systolic and diastolic congestive heart failure (HCC)    COPD with chronic bronchitis    Coronary artery disease    COVID-19 virus infection 06/2020   all symptoms resolved   Family history of breast cancer    Family history of breast cancer    Family history of lung cancer    History of kidney stones    Hyperlipidemia    Hypertension    Longstanding persistent atrial fibrillation (HCC)    Mitral stenosis with regurgitation    Myocardial infarction  ([place]) 2015   stents x 2   Obesity    Personal history of colonic polyps    Pneumonia 2015 and 06-2020   Pulmonary hypertension (HCC)    S/P Maze operation for atrial fibrillation 07/18/2018   Complete bilateral atrial lesion set using bipolar radiofrequency and cryothermy ablation with clipping of LA appendage   S/P mitral valve replacement with metallic valve 07/28/4096   Sorin Carbomedics Optiform bileaflet mechanical valve, size 33 mm   S/P TVR (tricuspid valve repair) 07/18/2018   Edwards mc3 ring annuloplasty, size 30 mm   Wears partial dentures    upper and lower     Allergies  Allergen Reactions   Ramipril Cough    cough   Percocet [Oxycodone-Acetaminophen]     In ICU it contributed to hallucinations. Can take by itself     Current Outpatient Medications  Medication Sig Dispense Refill   acetaminophen (TYLENOL) 500 MG tablet Take 1,000 mg by mouth 2 (two) times daily.     aspirin 81 MG tablet Take 81 mg by mouth daily.     atorvastatin (LIPITOR) 40 MG tablet Take 1 tablet (40 mg total) by mouth every evening. 90 tablet 3   metoprolol succinate (TOPROL-XL)  25 MG 24 hr tablet TAKE 1 TABLET(25 MG) BY MOUTH DAILY 90 tablet 3   sacubitril-valsartan (ENTRESTO) 24-26 MG Take 1 tablet by mouth 2 (two) times daily. 180 tablet 3   Semaglutide, 1 MG/DOSE, (OZEMPIC, 1 MG/DOSE,) 4 MG/3ML SOPN Inject 1 mg into the skin once a week. 9 mL 11   sildenafil (REVATIO) 20 MG tablet Take 1 tablet (20 mg total) by mouth 3 (three) times daily. 270 tablet 3   torsemide (DEMADEX) 20 MG tablet TAKE 2 TABLET BY MOUTH EVERY DAY 60 tablet 11   warfarin (COUMADIN) 4 MG tablet TAKE 4 MG DAILY EXCEPT TAKE 6 MG ON MONDAYS AND THURSDAYS 120 tablet 1   No current facility-administered medications for this visit.     Past Surgical History:  Procedure Laterality Date   APPENDECTOMY     as a child   CARDIOVASCULAR STRESS TEST  03/2014   Borderline reversible ischemic changes at the apex.  Normal LV  contractility/EF 52%.   CORONARY STENT PLACEMENT  7/12013   LEFT HEART CATHETERIZATION WITH CORONARY ANGIOGRAM N/A 06/12/2012   Procedure: LEFT HEART CATHETERIZATION WITH CORONARY ANGIOGRAM;  Surgeon: Clent Demark, MD;  Location: [place] CATH LAB;  Service: Cardiovascular;  Laterality: N/A;   MAZE N/A 07/18/2018   Procedure: MAZE;  Surgeon: Rexene Alberts, MD;  Location: [place];  Service: Open Heart Surgery;  Laterality: N/A;   MITRAL VALVE REPLACEMENT N/A 07/18/2018   Procedure: MITRAL VALVE (MV) REPLACEMENT WITH CARBOMEDICS OPTIFORM MITRAL VALVE SIZE 33.;  Surgeon: Rexene Alberts, MD;  Location: [place];  Service: Open Heart Surgery;  Laterality: N/A;   PERCUTANEOUS CORONARY STENT INTERVENTION (PCI-S) N/A 06/17/2012   Procedure: PERCUTANEOUS CORONARY STENT INTERVENTION (PCI-S);  Surgeon: Clent Demark, MD;  Location: [facility] CATH LAB;  Service: Cardiovascular;  Laterality: N/A;   RECTAL EXAM UNDER ANESTHESIA N/A 12/15/2020   Procedure: ANORECTAL EXAM UNDER ANESTHESIA EXCISION OF ANAL CANAL POLYP;  Surgeon: Ileana Roup, MD;  Location: [place];  Service: General;  Laterality: N/A;   RIGHT/LEFT HEART CATH AND CORONARY ANGIOGRAPHY N/A 06/10/2018   Procedure: RIGHT/LEFT HEART CATH AND CORONARY ANGIOGRAPHY;  Surgeon: Dixie Dials, MD;  Location: [place] CV LAB;  Service: Cardiovascular;  Laterality: N/A;   TEE WITHOUT CARDIOVERSION N/A 06/10/2018   Procedure: TRANSESOPHAGEAL ECHOCARDIOGRAM (TEE) with bubble study;  Surgeon: Dixie Dials, MD;  Location: [facility] ENDOSCOPY;  Service: Cardiovascular;  Laterality: N/A;   TEE WITHOUT CARDIOVERSION N/A 07/18/2018   Procedure: TRANSESOPHAGEAL ECHOCARDIOGRAM (TEE);  Surgeon: Rexene Alberts, MD;  Location: [place];  Service: Open Heart Surgery;  Laterality: N/A;   TRICUSPID VALVE REPLACEMENT N/A 07/18/2018   Procedure: TRICUSPID VALVE REPAIR WITH EDWARDS MC3 TRICUSPID ANNULOPLASTY RING SIZE 30.;  Surgeon: Rexene Alberts, MD;  Location: [place];   Service: Open Heart Surgery;  Laterality: N/A;     Allergies  Allergen Reactions   Ramipril Cough    cough   Percocet [Oxycodone-Acetaminophen]     In ICU it contributed to hallucinations. Can take by itself      Family History  Problem Relation Age of Onset   Heart disease Mother    Heart disease Father    Diabetes Father    Diabetes Brother    Breast cancer Maternal Aunt 76   Cancer Paternal Aunt        unk type, tumor on head   Lung cancer Cousin 29   Cancer Cousin 38   Colon cancer Neg Hx  Esophageal cancer Neg Hx    Stomach cancer Neg Hx      Social History Mr. Ketron reports that he quit smoking about 26 years ago. His smoking use included cigarettes. He has a 30.00 pack-year smoking history. He has never used smokeless tobacco. Mr. Welz reports no history of alcohol use.   Review of Systems CONSTITUTIONAL: No weight loss, fever, chills, weakness or fatigue.  HEENT: Eyes: No visual loss, blurred vision, double vision or yellow sclerae.No hearing loss, sneezing, congestion, runny nose or sore throat.  SKIN: No rash or itching.  CARDIOVASCULAR:  RESPIRATORY: No shortness of breath, cough or sputum.  GASTROINTESTINAL: No anorexia, nausea, vomiting or diarrhea. No abdominal pain or blood.  GENITOURINARY: No burning on urination, no polyuria NEUROLOGICAL: No headache, dizziness, syncope, paralysis, ataxia, numbness or tingling in the extremities. No change in bowel or bladder control.  MUSCULOSKELETAL: No muscle, back pain, joint pain or stiffness.  LYMPHATICS: No enlarged nodes. No history of splenectomy.  PSYCHIATRIC: No history of depression or anxiety.  ENDOCRINOLOGIC: No reports of sweating, cold or heat intolerance. No polyuria or polydipsia.  Marland Kitchen   Physical Examination There were no vitals filed for this visit. There were no vitals filed for this visit.  Gen: resting comfortably, no acute distress HEENT: no scleral icterus, pupils equal round and  reactive, no palptable cervical adenopathy,  CV Resp: Clear to auscultation bilaterally GI: abdomen is soft, non-tender, non-distended, normal bowel sounds, no hepatosplenomegaly MSK: extremities are warm, no edema.  Skin: warm, no rash Neuro:  no focal deficits Psych: appropriate affect   Diagnostic Studies     Assessment and Plan  1. History of MV replacment/mechnical MV -doing well without new symptoms, continue to monitor - on coumadin and ASA in setting of mechanical valve     2. Afib/acquired thrombophilia - no symptoms, continue currentmeds - on coumadin in setting of afib and mechanical MV   3. Hyperlipidemia - lipids are at goal, continue current meds   4. CAD - no symptoms, continue current meds   5. Chronic diastolic - he is euvolemic without symptoms, continue current diuretic.       Arnoldo Lenis, M.D., F.A.C.C.

## 2022-12-06 ENCOUNTER — Ambulatory Visit: Payer: PPO | Attending: Cardiology | Admitting: *Deleted

## 2022-12-06 ENCOUNTER — Encounter: Payer: Self-pay | Admitting: Nurse Practitioner

## 2022-12-06 ENCOUNTER — Ambulatory Visit (INDEPENDENT_AMBULATORY_CARE_PROVIDER_SITE_OTHER): Payer: PPO | Admitting: Nurse Practitioner

## 2022-12-06 VITALS — BP 120/68 | HR 68 | Ht 71.0 in | Wt 288.6 lb

## 2022-12-06 DIAGNOSIS — Z5181 Encounter for therapeutic drug level monitoring: Secondary | ICD-10-CM | POA: Diagnosis not present

## 2022-12-06 DIAGNOSIS — Z954 Presence of other heart-valve replacement: Secondary | ICD-10-CM

## 2022-12-06 DIAGNOSIS — I4891 Unspecified atrial fibrillation: Secondary | ICD-10-CM

## 2022-12-06 DIAGNOSIS — Z9889 Other specified postprocedural states: Secondary | ICD-10-CM | POA: Diagnosis not present

## 2022-12-06 DIAGNOSIS — Z79899 Other long term (current) drug therapy: Secondary | ICD-10-CM | POA: Diagnosis not present

## 2022-12-06 DIAGNOSIS — E785 Hyperlipidemia, unspecified: Secondary | ICD-10-CM

## 2022-12-06 DIAGNOSIS — I10 Essential (primary) hypertension: Secondary | ICD-10-CM

## 2022-12-06 DIAGNOSIS — I4821 Permanent atrial fibrillation: Secondary | ICD-10-CM | POA: Diagnosis not present

## 2022-12-06 DIAGNOSIS — I2510 Atherosclerotic heart disease of native coronary artery without angina pectoris: Secondary | ICD-10-CM

## 2022-12-06 DIAGNOSIS — I5032 Chronic diastolic (congestive) heart failure: Secondary | ICD-10-CM

## 2022-12-06 LAB — POCT INR: INR: 3.6 — AB (ref 2.0–3.0)

## 2022-12-06 NOTE — Patient Instructions (Addendum)
Medication Instructions:  Your physician recommends that you continue on your current medications as directed. Please refer to the Current Medication list given to you today.  Labwork: BMET & CBC today at [facility] ([place] Non-fasting  Testing/Procedures: none  Follow-Up: Your physician recommends that you schedule a follow-up appointment in: 6 months  Any Other Special Instructions Will Be Listed Below (If Applicable). Mediterranean Diet Salty Six Diet Referral to the PREP Program  If you need a refill on your cardiac medications before your next appointment, please call your pharmacy.

## 2022-12-06 NOTE — Patient Instructions (Signed)
Decrease dose to 1 tablet daily  Recheck in 4

## 2022-12-06 NOTE — Progress Notes (Addendum)
Cardiology Office Note:    Date:  12/06/2022   ID:  Jeffrey Larson, DOB January 27, 1956, MRN [id number]  PCP:  Luetta Nutting, [place] [facility] Cardiologist:  Carlyle Dolly, MD     Referring MD: Luetta Nutting, DO   CC: Here for follow-up  History of Present Illness:    Jeffrey Larson is a very pleasant 67 y.o. male with a hx of the following:  Nonobstructive CAD Permanent A-fib Chronic diastolic CHF Pulmonary hypertension, COPD Rheumatic mitral stenosis, status post mechanical MVR in 2019 Tricuspid valve repair Maze procedure Hypertension Hyperlipidemia   Previous cardiovascular history includes MI in 2013 with stenting.  Cardiac catheterization performed in 2019 showed nonobstructive CAD.  Had COVID-19 in 2020.   Last seen by Dr. Carlyle Dolly on December 19, 2021.  Echocardiogram in 2020 showed normal EF with normal MVR.  Denied any chest pain or palpitations.  No change in medication regimen was made.  Was told to follow-up in 6 months.  Last seen by Melina Copa, PA-C on March 12, 2022 via telehealth visit for pending colonoscopy.  He was doing well the time and denied any cardiac complaints.  Today he presents for overdue follow-up.  He states he is doing great.  Did state he had COVID 3 weeks ago, had mild symptoms and said it felt like a cold, recovered well.  Denies any chest pain, shortness of breath, palpitations, syncope, presyncope, dizziness, orthopnea, PND, swelling or significant weight changes, acute bleeding, or claudication.  [place].  Tolerating his medications well.  Compliant with his medications.  Denies any other questions or concerns today.   Past Medical History:  Diagnosis Date   Atrial fibrillation (HCC)    hx of   Cancer ([place])    skin cancer removed from arm dec 2021 melanoma   Chronic combined systolic and diastolic congestive heart failure (HCC)    COPD with chronic bronchitis    Coronary artery  disease    COVID-19 virus infection 06/2020   all symptoms resolved   Family history of breast cancer    Family history of breast cancer    Family history of lung cancer    History of kidney stones    Hyperlipidemia    Hypertension    Longstanding persistent atrial fibrillation (HCC)    Mitral stenosis with regurgitation    Myocardial infarction ([place]) 2015   stents x 2   Obesity    Personal history of colonic polyps    Pneumonia 2015 and 06-2020   Pulmonary hypertension (HCC)    S/P Maze operation for atrial fibrillation 07/18/2018   Complete bilateral atrial lesion set using bipolar radiofrequency and cryothermy ablation with clipping of LA appendage   S/P mitral valve replacement with metallic valve 2/84/1324   Sorin [employer] Optiform bileaflet mechanical valve, size 33 mm   S/P TVR (tricuspid valve repair) 07/18/2018   Edwards mc3 ring annuloplasty, size 30 mm   Wears partial dentures    upper and lower    Past Surgical History:  Procedure Laterality Date   APPENDECTOMY     as a child   CARDIOVASCULAR STRESS TEST  03/2014   Borderline reversible ischemic changes at the apex.  Normal LV contractility/EF 52%.   CORONARY STENT PLACEMENT  7/12013   LEFT HEART CATHETERIZATION WITH CORONARY ANGIOGRAM N/A 06/12/2012   Procedure: LEFT HEART CATHETERIZATION WITH CORONARY ANGIOGRAM;  Surgeon: Clent Demark, MD;  Location: [facility] CATH LAB;  Service:  Cardiovascular;  Laterality: N/A;   MAZE N/A 07/18/2018   Procedure: MAZE;  Surgeon: Rexene Alberts, MD;  Location: [facility];  Service: Open Heart Surgery;  Laterality: N/A;   MITRAL VALVE REPLACEMENT N/A 07/18/2018   Procedure: MITRAL VALVE (MV) REPLACEMENT WITH CARBOMEDICS OPTIFORM MITRAL VALVE SIZE 33.;  Surgeon: Rexene Alberts, MD;  Location: [facility];  Service: Open Heart Surgery;  Laterality: N/A;   PERCUTANEOUS CORONARY STENT INTERVENTION (PCI-S) N/A 06/17/2012   Procedure: PERCUTANEOUS CORONARY STENT INTERVENTION (PCI-S);  Surgeon: Clent Demark, MD;  Location: [facility] CATH LAB;  Service: Cardiovascular;  Laterality: N/A;   RECTAL EXAM UNDER ANESTHESIA N/A 12/15/2020   Procedure: ANORECTAL EXAM UNDER ANESTHESIA EXCISION OF ANAL CANAL POLYP;  Surgeon: Ileana Roup, MD;  Location: [facility];  Service: General;  Laterality: N/A;   RIGHT/LEFT HEART CATH AND CORONARY ANGIOGRAPHY N/A 06/10/2018   Procedure: RIGHT/LEFT HEART CATH AND CORONARY ANGIOGRAPHY;  Surgeon: Dixie Dials, MD;  Location: [place] [facility] CV LAB;  Service: Cardiovascular;  Laterality: N/A;   TEE WITHOUT CARDIOVERSION N/A 06/10/2018   Procedure: TRANSESOPHAGEAL ECHOCARDIOGRAM (TEE) with bubble study;  Surgeon: Dixie Dials, MD;  Location: [facility] ENDOSCOPY;  Service: Cardiovascular;  Laterality: N/A;   TEE WITHOUT CARDIOVERSION N/A 07/18/2018   Procedure: TRANSESOPHAGEAL ECHOCARDIOGRAM (TEE);  Surgeon: Rexene Alberts, MD;  Location: [place];  Service: Open Heart Surgery;  Laterality: N/A;   TRICUSPID VALVE REPLACEMENT N/A 07/18/2018   Procedure: TRICUSPID VALVE REPAIR WITH EDWARDS MC3 TRICUSPID ANNULOPLASTY RING SIZE 30.;  Surgeon: Rexene Alberts, MD;  Location: [place];  Service: Open Heart Surgery;  Laterality: N/A;    Current Medications: Current Meds  Medication Sig   acetaminophen (TYLENOL) 500 MG tablet Take 1,000 mg by mouth 2 (two) times daily.   aspirin 81 MG tablet Take 81 mg by mouth daily.   atorvastatin (LIPITOR) 40 MG tablet Take 1 tablet (40 mg total) by mouth every evening.   metoprolol succinate (TOPROL-XL) 25 MG 24 hr tablet TAKE 1 TABLET(25 MG) BY MOUTH DAILY   sacubitril-valsartan (ENTRESTO) 24-26 MG Take 1 tablet by mouth 2 (two) times daily.   Semaglutide, 1 MG/DOSE, (OZEMPIC, 1 MG/DOSE,) 4 MG/3ML SOPN Inject 1 mg into the skin once a week.   sildenafil (REVATIO) 20 MG tablet Take 1 tablet (20 mg total) by mouth 3 (three) times daily.   torsemide (DEMADEX) 20 MG tablet TAKE 2 TABLET BY MOUTH EVERY DAY   TRELEGY ELLIPTA 100-62.5-25  MCG/ACT AEPB Inhale 1 puff into the lungs daily.   warfarin (COUMADIN) 4 MG tablet TAKE 4 MG DAILY EXCEPT TAKE 6 MG ON MONDAYS AND THURSDAYS     Allergies:   Ramipril and Percocet [oxycodone-acetaminophen]   Social History   Socioeconomic History   Marital status: Married    Spouse name: Not on file   Number of children: Not on file   Years of education: Not on file   Highest education level: Not on file  Occupational History   Not on file  Tobacco Use   Smoking status: Former    Packs/day: 2.00    Years: 15.00    Total pack years: 30.00    Types: Cigarettes    Quit date: 11/26/1996    Years since quitting: 26.0   Smokeless tobacco: Never  Vaping Use   Vaping Use: Never used  Substance and Sexual Activity   Alcohol use: No   Drug use: No   Sexual activity: Yes  Other Topics Concern  Not on file  Social History Narrative   Not on file   Social Determinants of Health   Financial Resource Strain: Not on file  Food Insecurity: Not on file  Transportation Needs: Not on file  Physical Activity: Not on file  Stress: Not on file  Social Connections: Not on file     Family History: The patient's family history includes Breast cancer (age of onset: 81) in his maternal aunt; Cancer in his paternal aunt; Cancer (age of onset: 56) in his cousin; Diabetes in his brother and father; Heart disease in his father and mother; Lung cancer (age of onset: 45) in his cousin. There is no history of Colon cancer, Esophageal cancer, or Stomach cancer.  ROS:   Review of Systems  Constitutional: Negative.   HENT: Negative.    Eyes: Negative.   Respiratory: Negative.    Cardiovascular: Negative.   Gastrointestinal: Negative.   Genitourinary: Negative.   Musculoskeletal: Negative.   Skin: Negative.   Neurological: Negative.   Endo/Heme/Allergies: Negative.   Psychiatric/Behavioral: Negative.      Please see the history of present illness.    All other systems reviewed and are  negative.  EKGs/Labs/Other Studies Reviewed:    The following studies were reviewed today:   EKG:  EKG is ordered today.  The ekg ordered today demonstrates atrial fibrillation, rate controlled, with left posterior fascicular block, with nonspecific ST segment changes, no acute ischemic changes.  Echocardiogram on December 01, 2018: Study Conclusions   - Left ventricle: The cavity size was normal. Wall thickness was    increased in a pattern of mild LVH. Systolic function was normal.    The estimated ejection fraction was in the range of 55% to 60%.  - Aortic valve: Valve area (VTI): 3.01 cm^2. Valve area (Vmax):    2.46 cm^2. Valve area (Vmean): 2.9 cm^2.  - Mitral valve: There is a Sorin [employer]omedics Optiform bileaflet 33    mm mechanical valve in the MV position. There was no evidence for    stenosis. There was no significant regurgitation. Mean gradient    (D): 4 mm Hg. Valve area by pressure half-time: 2.06 cm^2. Valve    area by continuity equation (using LVOT flow): 1.59 cm^2.  - Left atrium: The atrium was severely dilated.  - Right ventricle: The cavity size was moderately dilated. Systolic    function was moderately reduced.  - Technically difficult study. Echoconrast was used to enhance    visualization.   Echo TEE on July 18, 2018:  Tricuspid valve: The tricuspid annulus was dilated and measured 4.5 cm  in the four-chamber view. There was 2+ tricuspid regurgitation by color  Doppler. The tricuspid leaflets appeared to open without restriction. On  the post bypass exam there was an annuloplasty ring in the tricuspid  position. There was trace residual tricuspid insufficiency. The leaflets  appeared to open normally without restriction.   Right ventricle: The right ventricular cavity was markedly dilated.  There was severe right ventricular systolic dysfunction with  interventricular septal flattening and hypokinesis of the RV free wall and  infundibulum. On the post  bypass exam, there was persistent marked right  ventricular enlargement with right ventricular systolic dysfunction and  interventricular septal flattening noted.   Right atrium: Cavity is mildly dilated.   Mitral valve: There was severe mitral stenosis and moderate mitral  regurgitation. The mitral leaflets were severely thickened, fused, and  calcified. There was the characteristic hockey-stick deformity of the  anterior leaflet  in diastole. The mean transmitral gradient was 14 mmHg.  The mitral valve area was 0.78 cm using the pressure half-time method.  There was a central jet of mitral insufficiency. On the post bypass exam  there was a mechanical bileaflet prosthesis present in the mitral po   Pulmonic valve: The pulmonic valve appeared structurally normal. There  was trace pulmonic insufficiency.  Right left heart cath on June 10, 2018: Prox RCA lesion is 10% stenosed. Mid RCA lesion is 50% stenosed. Post Atrio lesion is 30% stenosed. Ost LAD to Prox LAD lesion is 20% stenosed. Prox Cx lesion is 20% stenosed. Hemodynamic findings consistent with severe pulmonary hypertension. LV end diastolic pressure is normal.   Severe MV calcification. MV area 0.84 cm2. TEE and CVTS consult for MV surgery. Life-style modification for CAD.  Myoview on Apr 25, 2018: IMPRESSION: 1. No reversible ischemia.  Remote apical and basilar infarct.   2. Normal left ventricular wall motion.   3. Left ventricular ejection fraction 34%   4. Non invasive risk stratification*: Intermediate  Recent Labs: 03/09/2022: Hemoglobin 13.8; Platelets 271 04/04/2022: ALT 37; BUN 18; Creatinine, Ser 1.16; Potassium 4.1; Sodium 140  Recent Lipid Panel    Component Value Date/Time   CHOL 170 04/04/2022 0753   TRIG 210 (H) 04/04/2022 0753   HDL 34 (L) 04/04/2022 0753   CHOLHDL 5.0 04/04/2022 0753   CHOLHDL 3.9 06/21/2021 0000   LDLCALC 100 (H) 04/04/2022 0753   LDLCALC 60 06/21/2021 0000     Risk  Assessment/Calculations:    CHA2DS2-VASc Score = 5  This indicates a 7.2% annual risk of stroke. The patient's score is based upon: CHF History: 1 HTN History: 1 Diabetes History: 1 Stroke History: 0 Vascular Disease History: 1 Age Score: 1 Gender Score: 0   The 10-year ASCVD risk score (Arnett DK, et al., 2019) is: 28.2%   Values used to calculate the score:     Age: 5 years     Sex: Male     Is Non-Hispanic African American: No     Diabetic: Yes     Tobacco smoker: No     Systolic Blood Pressure: 121 mmHg     Is BP treated: Yes     HDL Cholesterol: 34 mg/dL     Total Cholesterol: 170 mg/dL   Physical Exam:    VS:  BP 120/68   Pulse 68   Ht '5\' 11"'$  (1.803 m)   Wt 288 lb 9.6 oz (130.9 kg)   SpO2 95%   BMI 40.25 kg/m     Wt Readings from Last 3 Encounters:  12/06/22 288 lb 9.6 oz (130.9 kg)  10/03/22 279 lb (126.6 kg)  08/13/22 279 lb (126.6 kg)     GEN: Morbidly obese, 67 y.o. male in no acute distress HEENT: Normal NECK: No JVD; No carotid bruits CARDIAC: S1/S2, RRR, no murmurs, rubs, gallops; 2+ pulses throughout RESPIRATORY:  Clear to auscultation without rales, wheezing or rhonchi  MUSCULOSKELETAL:  No edema; No deformity  SKIN: Warm and dry NEUROLOGIC:  Alert and oriented x 3 PSYCHIATRIC:  Normal affect   ASSESSMENT:    1. Permanent atrial fibrillation ([place])   2. Medication management   3. Coronary artery disease involving native heart without angina pectoris, unspecified vessel or lesion type   4. Chronic diastolic (congestive) heart failure ([place])   5. S/P mitral valve replacement with metallic valve   6. S/P TVR (tricuspid valve repair)   7. Essential hypertension, benign   8.  Hyperlipidemia, unspecified hyperlipidemia type   9. Morbid obesity ([place])    PLAN:    In order of problems listed above:  Permanent A-fib, chronic use of anticoagulation, medication management Today EKG shows A-fib with rate controlled.  Denies any tachycardia or  palpitations.  Continue Toprol-XL and continue to follow-up at Coumadin clinic.  Denies any bleeding issues while on Coumadin.  Heart healthy diet and regular cardiovascular exercise encouraged.  Will obtain CBC and BMET.  Nonobstructive CAD Cardiac catheterization in 2019 showed nonobstructive CAD.  Continue current medication regimen. Heart healthy diet and regular cardiovascular exercise encouraged.   Chronic diastolic CHF Echocardiogram in 2020 revealed normal EF at 55 to 60%. Euvolemic and well compensated on exam.  Continue current medication regimen. Low sodium diet, fluid restriction <2L, and daily weights encouraged. Educated to contact our office for weight gain of 2 lbs overnight or 5 lbs in one week.  Dr. Sallyanne Kuster in 2020 stated because he had normal pumping function, may be able to stop Entresto. Will obtain labs and consult Dr. Carlyle Dolly if medication can be stopped or if dose needs to be increased. Heart healthy diet and regular cardiovascular exercise encouraged.   Addendum 12/23/2022: Dr. Harl Bowie stated to continue [facility].   Rheumatic mitral stenosis, status post mechanical mitral valve replacement in 2019, tricuspid valve repair Echocardiogram in 2020 showed normal prosthetic valve function of mitral valve.  Tricuspid valve was not well-visualized, there was no evidence or sign of significant regurgitation.  Continue current medication regimen.  Normal valve function heard on exam.  Consider updating echocardiogram at next office visit.   Hypertension BP stable today.  BP well-controlled at home.  Continue current medication regimen. Discussed to monitor BP at home at least 2 hours after medications and sitting for 5-10 minutes. Heart healthy diet and regular cardiovascular exercise encouraged.   Hyperlipidemia Currently being managed by PCP.  Labs obtained in May 2023 revealed total cholesterol 170, triglycerides 210, HDL 34, and LDL 100.  I recommend FLP and LFT be obtained  in May 2024.  If LDL is not less than 100, need to increase atorvastatin to 80 mg daily.  Continue current medication regimen at this time. Heart healthy diet and regular cardiovascular exercise encouraged.   Morbid Obesity BMI today 40.25. Weight loss via diet and exercise encouraged. Discussed the impact being overweight would have on cardiovascular risk.  Discussed PREP program with him and he is agreeable to referral.     8.  Disposition: Follow-up with Dr. Carlyle Dolly in 6 months or sooner if anything changes.   Medication Adjustments/Labs and Tests Ordered: Current medicines are reviewed at length with the patient today.  Concerns regarding medicines are outlined above.  Orders Placed This Encounter  Procedures   Basic metabolic panel   CBC   Amb Referral To Provider Referral Exercise Program (P.R.E.P)   EKG 12-Lead   No orders of the defined types were placed in this encounter.   Patient Instructions  Medication Instructions:  Your physician recommends that you continue on your current medications as directed. Please refer to the Current Medication list given to you today.  Labwork: BMET & CBC today at [facility] ([place]) Non-fasting  Testing/Procedures: none  Follow-Up: Your physician recommends that you schedule a follow-up appointment in: 6 months  Any Other Special Instructions Will Be Listed Below (If Applicable). Mediterranean Diet Salty Six Diet Referral to the PREP Program  If you need a refill on your cardiac medications before  your next appointment, please call your pharmacy.   SignedFinis Bud, NP  12/06/2022 9:44 AM    [place]

## 2022-12-07 ENCOUNTER — Telehealth: Payer: Self-pay | Admitting: *Deleted

## 2022-12-07 LAB — CBC
Hematocrit: 40.8 % (ref 37.5–51.0)
Hemoglobin: 13.5 g/dL (ref 13.0–17.7)
MCH: 29.5 pg (ref 26.6–33.0)
MCHC: 33.1 g/dL (ref 31.5–35.7)
MCV: 89 fL (ref 79–97)
Platelets: 286 10*3/uL (ref 150–450)
RBC: 4.58 x10E6/uL (ref 4.14–5.80)
RDW: 13 % (ref 11.6–15.4)
WBC: 8.7 10*3/uL (ref 3.4–10.8)

## 2022-12-07 LAB — BASIC METABOLIC PANEL
BUN/Creatinine Ratio: 18 (ref 10–24)
BUN: 16 mg/dL (ref 8–27)
CO2: 24 mmol/L (ref 20–29)
Calcium: 9.5 mg/dL (ref 8.6–10.2)
Chloride: 105 mmol/L (ref 96–106)
Creatinine, Ser: 0.87 mg/dL (ref 0.76–1.27)
Glucose: 96 mg/dL (ref 70–99)
Potassium: 4.7 mmol/L (ref 3.5–5.2)
Sodium: 142 mmol/L (ref 134–144)
eGFR: 95 mL/min/{1.73_m2} (ref >59)

## 2022-12-07 NOTE — Telephone Encounter (Signed)
Contacted regarding PREP Class referral. Left voice message for return call for more information.

## 2022-12-19 DIAGNOSIS — L0202 Furuncle of face: Secondary | ICD-10-CM | POA: Diagnosis not present

## 2022-12-19 DIAGNOSIS — X32XXXD Exposure to sunlight, subsequent encounter: Secondary | ICD-10-CM | POA: Diagnosis not present

## 2022-12-19 DIAGNOSIS — Z85828 Personal history of other malignant neoplasm of skin: Secondary | ICD-10-CM | POA: Diagnosis not present

## 2022-12-19 DIAGNOSIS — L570 Actinic keratosis: Secondary | ICD-10-CM | POA: Diagnosis not present

## 2022-12-19 DIAGNOSIS — Z08 Encounter for follow-up examination after completed treatment for malignant neoplasm: Secondary | ICD-10-CM | POA: Diagnosis not present

## 2022-12-19 DIAGNOSIS — L218 Other seborrheic dermatitis: Secondary | ICD-10-CM | POA: Diagnosis not present

## 2022-12-19 DIAGNOSIS — B9689 Other specified bacterial agents as the cause of diseases classified elsewhere: Secondary | ICD-10-CM | POA: Diagnosis not present

## 2022-12-31 ENCOUNTER — Other Ambulatory Visit: Payer: Self-pay | Admitting: Cardiology

## 2023-01-01 ENCOUNTER — Encounter: Payer: Self-pay | Admitting: *Deleted

## 2023-01-03 ENCOUNTER — Ambulatory Visit: Payer: HMO | Attending: Cardiology | Admitting: *Deleted

## 2023-01-03 DIAGNOSIS — I4891 Unspecified atrial fibrillation: Secondary | ICD-10-CM | POA: Diagnosis not present

## 2023-01-03 DIAGNOSIS — Z954 Presence of other heart-valve replacement: Secondary | ICD-10-CM

## 2023-01-03 DIAGNOSIS — Z5181 Encounter for therapeutic drug level monitoring: Secondary | ICD-10-CM

## 2023-01-03 LAB — POCT INR: INR: 2 (ref 2.0–3.0)

## 2023-01-03 NOTE — Patient Instructions (Signed)
Take warfarin 2 tablets tonight then resume 1 tablet daily  Recheck in 3 wks

## 2023-01-07 ENCOUNTER — Other Ambulatory Visit (HOSPITAL_COMMUNITY): Payer: Self-pay

## 2023-01-24 ENCOUNTER — Ambulatory Visit: Payer: HMO | Attending: Cardiology | Admitting: *Deleted

## 2023-01-24 DIAGNOSIS — Z5181 Encounter for therapeutic drug level monitoring: Secondary | ICD-10-CM

## 2023-01-24 DIAGNOSIS — Z954 Presence of other heart-valve replacement: Secondary | ICD-10-CM | POA: Diagnosis not present

## 2023-01-24 DIAGNOSIS — I4891 Unspecified atrial fibrillation: Secondary | ICD-10-CM

## 2023-01-24 LAB — POCT INR: POC INR: 3.5

## 2023-01-24 NOTE — Patient Instructions (Signed)
Description   Continue to take warfarin 1 tablet daily.  Recheck in 4 wks

## 2023-02-21 ENCOUNTER — Ambulatory Visit: Payer: HMO | Attending: Internal Medicine | Admitting: *Deleted

## 2023-02-21 DIAGNOSIS — Z954 Presence of other heart-valve replacement: Secondary | ICD-10-CM

## 2023-02-21 DIAGNOSIS — I4891 Unspecified atrial fibrillation: Secondary | ICD-10-CM | POA: Diagnosis not present

## 2023-02-21 DIAGNOSIS — Z5181 Encounter for therapeutic drug level monitoring: Secondary | ICD-10-CM

## 2023-02-21 LAB — POCT INR: INR: 2.7 (ref 2.0–3.0)

## 2023-02-21 NOTE — Patient Instructions (Signed)
Forgot warfarin last night.  Took the 1 tablet this morning. Take warfarin 1/2 tablet tonight then continue 1 tablet daily.  Recheck in 5 wks

## 2023-03-01 ENCOUNTER — Other Ambulatory Visit: Payer: Self-pay

## 2023-03-01 ENCOUNTER — Other Ambulatory Visit: Payer: Self-pay | Admitting: Cardiology

## 2023-03-01 DIAGNOSIS — Z954 Presence of other heart-valve replacement: Secondary | ICD-10-CM

## 2023-03-01 MED ORDER — ENTRESTO 24-26 MG PO TABS: 1.0000 | ORAL_TABLET | Freq: Two times a day (BID) | ORAL | 1 refills | Status: DC

## 2023-03-01 NOTE — Telephone Encounter (Signed)
Prescription refill request received for warfarin Lov: 12/06/22 Philis Nettle)   Next INR check: 03/28/23 Warfarin tablet strength: 4mg   Appropriate dose. Refill sent.

## 2023-03-08 ENCOUNTER — Telehealth: Payer: Self-pay | Admitting: Family Medicine

## 2023-03-08 NOTE — Telephone Encounter (Signed)
Contacted Rolan Bucco Neyens to schedule their annual wellness visit. Appointment made for 04/01/2023 at 9 AM.  Cira Servant Patient Access Advocate II Direct Dial: [phone]

## 2023-03-08 NOTE — Telephone Encounter (Signed)
Called patient to schedule Medicare Annual Wellness Visit (AWV). Left message for patient to call back and schedule Medicare Annual Wellness Visit (AWV).  Last date of AWV: Never  Please schedule an appointment at any time with [facility].  If any questions, please contact me at [phone].  Thank you ,  Morgan Jessup Patient Access Advocate II Direct Dial: [phone]  

## 2023-03-12 ENCOUNTER — Other Ambulatory Visit: Payer: Self-pay

## 2023-03-12 DIAGNOSIS — E782 Mixed hyperlipidemia: Secondary | ICD-10-CM

## 2023-03-12 MED ORDER — ATORVASTATIN CALCIUM 40 MG PO TABS: 40.0000 mg | ORAL_TABLET | Freq: Every evening | ORAL | 3 refills | Status: DC

## 2023-03-28 ENCOUNTER — Ambulatory Visit: Payer: HMO | Attending: Cardiology | Admitting: *Deleted

## 2023-03-28 DIAGNOSIS — Z954 Presence of other heart-valve replacement: Secondary | ICD-10-CM

## 2023-03-28 DIAGNOSIS — Z5181 Encounter for therapeutic drug level monitoring: Secondary | ICD-10-CM

## 2023-03-28 DIAGNOSIS — I4891 Unspecified atrial fibrillation: Secondary | ICD-10-CM | POA: Diagnosis not present

## 2023-03-28 LAB — POCT INR: INR: 1.8 — AB (ref 2.0–3.0)

## 2023-03-28 NOTE — Patient Instructions (Signed)
Take warfarin 2 tablets today, 1 1/2 tablets tomorrow then resume 1 tablet daily. Recheck in 2 wks

## 2023-04-01 ENCOUNTER — Ambulatory Visit: Payer: HMO

## 2023-04-02 ENCOUNTER — Ambulatory Visit (INDEPENDENT_AMBULATORY_CARE_PROVIDER_SITE_OTHER): Payer: HMO | Admitting: Family Medicine

## 2023-04-02 DIAGNOSIS — Z0000 Encounter for general adult medical examination without abnormal findings: Secondary | ICD-10-CM

## 2023-04-02 NOTE — Patient Instructions (Addendum)
MEDICARE ANNUAL WELLNESS VISIT Health Maintenance Summary and Written Plan of Care  [name] ,  Thank you for allowing me to perform your Medicare Annual Wellness Visit and for your ongoing commitment to your health.   Health Maintenance & Immunization History Health Maintenance  Topic Date Due  . OPHTHALMOLOGY EXAM  04/02/2023 (Originally 06/05/2017)  . Diabetic kidney evaluation - Urine ACR  04/03/2023 (Originally 09[birth year]10[birth year]1974)  . FOOT EXAM  04/06/2023 (Originally 11/28/2018)  . COVID-19 Vaccine (4 - 2023-24 season) 04/18/2023 (Originally 07/27/2022)  . HEMOGLOBIN A1C  05/03/2023 (Originally 10/05/2022)  . Zoster Vaccines- Shingrix (1 of 2) 07/03/2023 (Originally 09[birth year]11/1975)  . INFLUENZA VACCINE  06/27/2023  . Diabetic kidney evaluation - eGFR measurement  12/07/2023  . Medicare Annual Wellness (AWV)  04/01/2024  . DTaP/Tdap/Td (3 - Td or Tdap) 04/21/2030  . Pneumonia Vaccine 31+ Years old  Completed  . Hepatitis C Screening  Completed  . HPV VACCINES  Aged Out  . Fecal DNA (Cologuard)  Discontinued   Immunization History  Administered Date(s) Administered  . Fluad Quad(high Dose 65+) 02/16/2022, 10/03/2022  . Influenza,inj,Quad PF,6+ Mos 11/08/2016, 08/26/2017, 07/31/2018, 09/10/2019, 11/16/2020  . PFIZER(Purple Top)SARS-COV-2 Vaccination 02/20/2020, 03/19/2020, 11/14/2020  . PNEUMOCOCCAL CONJUGATE-20 10/03/2022  . Tdap 07/20/2015, 04/21/2020    These are the patient goals that we discussed:  Goals Addressed              This Visit's Progress   .  patient stated (pt-stated)        Patient stated that he would like to loose 50 lbs.        This is a list of Health Maintenance Items that are overdue or due now: Shingles vaccine Urine ACR Eye exam-need records Foot exam Colonoscopy - Completed in 2023- due in 2025  Orders/Referrals Placed Today: No orders of the defined types were placed in this encounter.  (Contact our referral department at [phone] if  you have not spoken with someone about your referral appointment within the next 5 days)    Follow-up Plan Follow-up with Everrett Coombe, DO as planned Schedule shingles vaccine at the pharmacy. Medicare wellness visit in one year.  Patient will access AVS on my chart.        Health Maintenance, Male Adopting a healthy lifestyle and getting preventive care are important in promoting health and wellness. Ask your health care provider about: The right schedule for you to have regular tests and exams. Things you can do on your own to prevent diseases and keep yourself healthy. What should I know about diet, weight, and exercise? Eat a healthy diet  Eat a diet that includes plenty of vegetables, fruits, low-fat dairy products, and lean protein. Do not eat a lot of foods that are high in solid fats, added sugars, or sodium. Maintain a healthy weight Body mass index (BMI) is a measurement that can be used to identify possible weight problems. It estimates body fat based on height and weight. Your health care provider can help determine your BMI and help you achieve or maintain a healthy weight. Get regular exercise Get regular exercise. This is one of the most important things you can do for your health. Most adults should: Exercise for at least 150 minutes each week. The exercise should increase your heart rate and make you sweat (moderate-intensity exercise). Do strengthening exercises at least twice a week. This is in addition to the moderate-intensity exercise. Spend less time sitting. Even light physical activity can be beneficial. Watch cholesterol  and blood lipids Have your blood tested for lipids and cholesterol at 67 years of age, then have this test every 5 years. You may need to have your cholesterol levels checked more often if: Your lipid or cholesterol levels are high. You are older than 67 years of age. You are at high risk for heart disease. What should I know about  cancer screening? Many types of cancers can be detected early and may often be prevented. Depending on your health history and family history, you may need to have cancer screening at various ages. This may include screening for: Colorectal cancer. Prostate cancer. Skin cancer. Lung cancer. What should I know about heart disease, diabetes, and high blood pressure? Blood pressure and heart disease High blood pressure causes heart disease and increases the risk of stroke. This is more likely to develop in people who have high blood pressure readings or are overweight. Talk with your health care provider about your target blood pressure readings. Have your blood pressure checked: Every 3-5 years if you are 63-38 years of age. Every year if you are 64 years old or older. If you are between the ages of 42 and 61 and are a current or former smoker, ask your health care provider if you should have a one-time screening for abdominal aortic aneurysm (AAA). Diabetes Have regular diabetes screenings. This checks your fasting blood sugar level. Have the screening done: Once every three years after age 73 if you are at a normal weight and have a low risk for diabetes. More often and at a younger age if you are overweight or have a high risk for diabetes. What should I know about preventing infection? Hepatitis B If you have a higher risk for hepatitis B, you should be screened for this virus. Talk with your health care provider to find out if you are at risk for hepatitis B infection. Hepatitis C Blood testing is recommended for: Everyone born from [birth year] through 1965. Anyone with known risk factors for hepatitis C. Sexually transmitted infections (STIs) You should be screened each year for STIs, including gonorrhea and chlamydia, if: You are sexually active and are younger than 67 years of age. You are older than 67 years of age and your health care provider tells you that you are at risk for this type  of infection. Your sexual activity has changed since you were last screened, and you are at increased risk for chlamydia or gonorrhea. Ask your health care provider if you are at risk. Ask your health care provider about whether you are at high risk for HIV. Your health care provider may recommend a prescription medicine to help prevent HIV infection. If you choose to take medicine to prevent HIV, you should first get tested for HIV. You should then be tested every 3 months for as long as you are taking the medicine. Follow these instructions at home: Alcohol use Do not drink alcohol if your health care provider tells you not to drink. If you drink alcohol: Limit how much you have to 0-2 drinks a day. Know how much alcohol is in your drink. In the [place] one drink equals one 12 oz bottle of beer (355 mL), one 5 oz glass of wine (148 mL), or one 1 oz glass of hard liquor (44 mL). Lifestyle Do not use any products that contain nicotine or tobacco. These products include cigarettes, chewing tobacco, and vaping devices, such as e-cigarettes. If you need help quitting, ask your health care provider.  Do not use street drugs. Do not share needles. Ask your health care provider for help if you need support or information about quitting drugs. General instructions Schedule regular health, dental, and eye exams. Stay current with your vaccines. Tell your health care provider if: You often feel depressed. You have ever been abused or do not feel safe at home. Summary Adopting a healthy lifestyle and getting preventive care are important in promoting health and wellness. Follow your health care provider's instructions about healthy diet, exercising, and getting tested or screened for diseases. Follow your health care provider's instructions on monitoring your cholesterol and blood pressure. This information is not intended to replace advice given to you by your health care provider. Make sure you discuss  any questions you have with your health care provider. Document Revised: 04/03/2021 Document Reviewed: 04/03/2021 Elsevier Patient Education  2023 [employer].

## 2023-04-02 NOTE — Progress Notes (Signed)
MEDICARE ANNUAL WELLNESS VISIT  04/02/2023  Telephone Visit Disclaimer This Medicare AWV was conducted by telephone due to national recommendations for restrictions regarding the COVID-19 Pandemic (e.g. social distancing).  I verified, using two identifiers, that I am speaking with Jeffrey Larson or their authorized healthcare agent. I discussed the limitations, risks, security, and privacy concerns of performing an evaluation and management service by telephone and the potential availability of an in-person appointment in the future. The patient expressed understanding and agreed to proceed.  Location of Patient: Home Location of Provider (nurse):  In the office.  Subjective:    Jeffrey Larson is a 67 y.o. male patient of Everrett Coombe, DO who had a Medicare Annual Wellness Visit today via telephone. Jeffrey Larson is Retired and lives with their spouse. he has 2 children. he reports that he is socially active and does interact with friends/family regularly. he is moderately physically active and enjoys riding horses and going fishing on his boat.  Patient Care Team: Everrett Coombe, DO as PCP - General (Family Medicine) Wyline Mood Dorothe Pea, MD as PCP - Cardiology (Cardiology) Rinaldo Cloud, MD as Consulting Physician (Cardiology)     04/02/2023    8:10 AM 12/15/2020    7:30 AM 07/31/2019   10:56 AM 07/08/2019    2:44 PM 01/14/2019    2:40 PM 07/25/2018    8:00 AM 07/15/2018    1:54 PM  Advanced Directives  Does Patient Have a Medical Advance Directive? No No No No No No No  Would patient like information on creating a medical advance directive? No - Patient declined No - Patient declined No - Patient declined No - Patient declined No - Patient declined No - Patient declined No - Patient declined    Hospital Utilization Over the Past 12 Months: # of hospitalizations or ER visits: 0 # of surgeries: 0  Review of Systems    Patient reports that his overall health is better compared to last  year.  History obtained from chart review and the patient  Patient Reported Readings (BP, Pulse, CBG, Weight, etc) none  Pain Assessment Pain : No/denies pain     Current Medications & Allergies (verified) Allergies as of 04/02/2023       Reactions   Ramipril Cough   cough   Percocet [oxycodone-acetaminophen]    In ICU it contributed to hallucinations. Can take by itself        Medication List        Accurate as of Apr 02, 2023  8:27 AM. If you have any questions, ask your nurse or doctor.          acetaminophen 500 MG tablet Commonly known as: TYLENOL Take 1,000 mg by mouth 2 (two) times daily.   alclomethasone 0.05 % cream Commonly known as: ACLOVATE Apply topically 2 (two) times daily as needed.   aspirin 81 MG tablet Take 81 mg by mouth daily.   atorvastatin 40 MG tablet Commonly known as: LIPITOR Take 1 tablet (40 mg total) by mouth every evening.   clindamycin 1 % gel Commonly known as: CLINDAGEL Apply topically.   Entresto 24-26 MG Generic drug: sacubitril-valsartan Take 1 tablet by mouth 2 (two) times daily.   metoprolol succinate 25 MG 24 hr tablet Commonly known as: TOPROL-XL TAKE 1 TABLET(25 MG) BY MOUTH DAILY   Ozempic (1 MG/DOSE) 4 MG/3ML Sopn Generic drug: Semaglutide (1 MG/DOSE) Inject 1 mg into the skin once a week.   sildenafil 20 MG tablet Commonly  known as: REVATIO TAKE 1 TABLET BY MOUTH THREE TIMES DAILY   torsemide 20 MG tablet Commonly known as: DEMADEX TAKE 2 TABLET BY MOUTH EVERY DAY   Trelegy Ellipta 100-62.5-25 MCG/ACT Aepb Generic drug: Fluticasone-Umeclidin-Vilant Inhale 1 puff into the lungs daily.   warfarin 4 MG tablet Commonly known as: COUMADIN Take as directed by the anticoagulation clinic. If you are unsure how to take this medication, talk to your nurse or doctor. Original instructions: TAKE 1 TABLET BY MOUTH DAILY OR AS DIRECTED BY COUMADIN CLINIC        History (reviewed): Past Medical History:   Diagnosis Date   Atrial fibrillation (HCC)    hx of   Cancer (HCC)    skin cancer removed from arm dec 2021 melanoma   Chronic combined systolic and diastolic congestive heart failure (HCC)    COPD with chronic bronchitis    Coronary artery disease    COVID-19 virus infection 06/2020   all symptoms resolved   Family history of breast cancer    Family history of breast cancer    Family history of lung cancer    History of kidney stones    Hyperlipidemia    Hypertension    Longstanding persistent atrial fibrillation (HCC)    Mitral stenosis with regurgitation    Myocardial infarction (HCC) 2015   stents x 2   Obesity    Personal history of colonic polyps    Pneumonia 2015 and 06-2020   Pulmonary hypertension (HCC)    S/P Maze operation for atrial fibrillation 07/18/2018   Complete bilateral atrial lesion set using bipolar radiofrequency and cryothermy ablation with clipping of LA appendage   S/P mitral valve replacement with metallic valve 07/18/2018   Sorin Carbomedics Optiform bileaflet mechanical valve, size 33 mm   S/P TVR (tricuspid valve repair) 07/18/2018   Edwards mc3 ring annuloplasty, size 30 mm   Wears partial dentures    upper and lower   Past Surgical History:  Procedure Laterality Date   APPENDECTOMY     as a child   CARDIOVASCULAR STRESS TEST  03/2014   Borderline reversible ischemic changes at the apex.  Normal LV contractility/EF 52%.   CORONARY STENT PLACEMENT  7/12013   LEFT HEART CATHETERIZATION WITH CORONARY ANGIOGRAM N/A 06/12/2012   Procedure: LEFT HEART CATHETERIZATION WITH CORONARY ANGIOGRAM;  Surgeon: Robynn Pane, MD;  Location: [facility] CATH LAB;  Service: Cardiovascular;  Laterality: N/A;   MAZE N/A 07/18/2018   Procedure: MAZE;  Surgeon: Purcell Nails, MD;  Location: [facility] OR;  Service: Open Heart Surgery;  Laterality: N/A;   MITRAL VALVE REPLACEMENT N/A 07/18/2018   Procedure: MITRAL VALVE (MV) REPLACEMENT WITH CARBOMEDICS OPTIFORM MITRAL VALVE SIZE  33.;  Surgeon: Purcell Nails, MD;  Location: [facility] OR;  Service: Open Heart Surgery;  Laterality: N/A;   PERCUTANEOUS CORONARY STENT INTERVENTION (PCI-S) N/A 06/17/2012   Procedure: PERCUTANEOUS CORONARY STENT INTERVENTION (PCI-S);  Surgeon: Robynn Pane, MD;  Location: [facility] CATH LAB;  Service: Cardiovascular;  Laterality: N/A;   RECTAL EXAM UNDER ANESTHESIA N/A 12/15/2020   Procedure: ANORECTAL EXAM UNDER ANESTHESIA EXCISION OF ANAL CANAL POLYP;  Surgeon: Andria Meuse, MD;  Location: [facility];  Service: General;  Laterality: N/A;   RIGHT/LEFT HEART CATH AND CORONARY ANGIOGRAPHY N/A 06/10/2018   Procedure: RIGHT/LEFT HEART CATH AND CORONARY ANGIOGRAPHY;  Surgeon: Orpah Cobb, MD;  Location: [facility] INVASIVE CV LAB;  Service: Cardiovascular;  Laterality: N/A;   TEE WITHOUT CARDIOVERSION N/A 06/10/2018  Procedure: TRANSESOPHAGEAL ECHOCARDIOGRAM (TEE) with bubble study;  Surgeon: Orpah Cobb, MD;  Location: [facility] ENDOSCOPY;  Service: Cardiovascular;  Laterality: N/A;   TEE WITHOUT CARDIOVERSION N/A 07/18/2018   Procedure: TRANSESOPHAGEAL ECHOCARDIOGRAM (TEE);  Surgeon: Purcell Nails, MD;  Location: [facility] OR;  Service: Open Heart Surgery;  Laterality: N/A;   TRICUSPID VALVE REPLACEMENT N/A 07/18/2018   Procedure: TRICUSPID VALVE REPAIR WITH EDWARDS MC3 TRICUSPID ANNULOPLASTY RING SIZE 30.;  Surgeon: Purcell Nails, MD;  Location: [facility] OR;  Service: Open Heart Surgery;  Laterality: N/A;   Family History  Problem Relation Age of Onset   Heart disease Mother    Heart disease Father    Diabetes Father    Diabetes Brother    Breast cancer Maternal Aunt 89   Cancer Paternal Aunt        unk type, tumor on head   Lung cancer Cousin 99   Cancer Cousin 38   Colon cancer Neg Hx    Esophageal cancer Neg Hx    Stomach cancer Neg Hx    Social History   Socioeconomic History   Marital status: Married    Spouse name: Diane   Number of children: 2   Years of education: 9   Highest  education level: 9th grade  Occupational History   Occupation: Retired.  Tobacco Use   Smoking status: Former    Packs/day: 2.00    Years: 15.00    Additional pack years: 0.00    Total pack years: 30.00    Types: Cigarettes    Quit date: 11/26/1996    Years since quitting: 26.3   Smokeless tobacco: Never  Vaping Use   Vaping Use: Never used  Substance and Sexual Activity   Alcohol use: No   Drug use: No   Sexual activity: Yes  Other Topics Concern   Not on file  Social History Narrative   Lives with his wife. He has two children. He has a farm and stays busy. He enjoys riding horses and going fishing on his boat.   Social Determinants of Health   Financial Resource Strain: Low Risk  (04/02/2023)   Overall Financial Resource Strain (CARDIA)    Difficulty of Paying Living Expenses: Not hard at all  Food Insecurity: No Food Insecurity (04/02/2023)   Hunger Vital Sign    Worried About Running Out of Food in the Last Year: Never true    Ran Out of Food in the Last Year: Never true  Transportation Needs: No Transportation Needs (04/02/2023)   PRAPARE - Administrator, Civil Service (Medical): No    Lack of Transportation (Non-Medical): No  Physical Activity: Sufficiently Active (04/02/2023)   Exercise Vital Sign    Days of Exercise per Week: 7 days    Minutes of Exercise per Session: 30 min  Stress: No Stress Concern Present (04/02/2023)   [employer] of Occupational Health - Occupational Stress Questionnaire    Feeling of Stress : Not at all  Social Connections: Moderately Integrated (04/02/2023)   Social Connection and Isolation Panel [NHANES]    Frequency of Communication with Friends and Family: More than three times a week    Frequency of Social Gatherings with Friends and Family: Once a week    Attends Religious Services: More than 4 times per year    Active Member of [employer] or Organizations: No    Attends Banker Meetings: Never    Marital Status:  Married    Activities of Daily Living  04/02/2023    8:14 AM  In your present state of health, do you have any difficulty performing the following activities:  Hearing? 0  Vision? 0  Difficulty concentrating or making decisions? 0  Walking or climbing stairs? 0  Dressing or bathing? 0  Doing errands, shopping? 0  Preparing Food and eating ? N  Using the Toilet? N  In the past six months, have you accidently leaked urine? N  Do you have problems with loss of bowel control? N  Managing your Medications? N  Managing your Finances? N  Housekeeping or managing your Housekeeping? N    Patient Education/ Literacy How often do you need to have someone help you when you read instructions, pamphlets, or other written materials from your doctor or pharmacy?: 1 - Never What is the last grade level you completed in school?: 9th grade  Exercise Current Exercise Habits: Home exercise routine, Type of exercise: walking, Time (Minutes): 30, Frequency (Times/Week): 7, Weekly Exercise (Minutes/Week): 210, Intensity: Moderate, Exercise limited by: None identified  Diet Patient reports consuming 3 meals a day and 0 snack(s) a day Patient reports that his primary diet is: Regular Patient reports that she does have regular access to food.   Depression Screen    04/02/2023    8:14 AM 04/02/2022    3:32 PM 06/21/2020    9:26 AM 11/10/2019    9:25 AM 01/14/2019    2:38 PM 09/04/2018   10:46 AM 08/02/2017    2:46 PM  PHQ 2/9 Scores  PHQ - 2 Score 0 0 3 0 0  0  PHQ- 9 Score   3 4 0    Exception Documentation      Patient refusal      Fall Risk    04/02/2023    8:14 AM 04/02/2022    3:32 PM 01/14/2019    2:37 PM  Fall Risk   Falls in the past year? 0 0 0  Number falls in past yr: 0 0   Injury with Fall? 0 0   Risk for fall due to : No Fall Risks No Fall Risks   Risk for fall due to: Comment   No history of falls  Follow up Falls evaluation completed Falls evaluation completed Falls evaluation  completed     Objective:  Jeffrey Larson seemed alert and oriented and he participated appropriately during our telephone visit.  Blood Pressure Weight BMI  BP Readings from Last 3 Encounters:  12/06/22 120/68  10/03/22 104/67  08/13/22 110/72   Wt Readings from Last 3 Encounters:  12/06/22 288 lb 9.6 oz (130.9 kg)  10/03/22 279 lb (126.6 kg)  08/13/22 279 lb (126.6 kg)   BMI Readings from Last 1 Encounters:  12/06/22 40.25 kg/m    *Unable to obtain current vital signs, weight, and BMI due to telephone visit type  Hearing/Vision  Jeffrey Larson did not seem to have difficulty with hearing/understanding during the telephone conversation Reports that he has had a formal eye exam by an eye care professional within the past year Reports that he has not had a formal hearing evaluation within the past year *Unable to fully assess hearing and vision during telephone visit type  Cognitive Function:    04/02/2023    8:16 AM  6CIT Screen  What Year? 0 points  What month? 0 points  What time? 0 points  Count back from 20 0 points  Months in reverse 4 points  Repeat phrase 2 points  Total Score  6 points   (Normal:0-7, Significant for Dysfunction: >8)  Normal Cognitive Function Screening: Yes   Immunization & Health Maintenance Record Immunization History  Administered Date(s) Administered   Fluad Quad(high Dose 65+) 02/16/2022, 10/03/2022   Influenza,inj,Quad PF,6+ Mos 11/08/2016, 08/26/2017, 07/31/2018, 09/10/2019, 11/16/2020   PFIZER(Purple Top)SARS-COV-2 Vaccination 02/20/2020, 03/19/2020, 11/14/2020   PNEUMOCOCCAL CONJUGATE-20 10/03/2022   Tdap 07/20/2015, 04/21/2020    Health Maintenance  Topic Date Due   OPHTHALMOLOGY EXAM  04/02/2023 (Originally 06/05/2017)   Diabetic kidney evaluation - Urine ACR  04/03/2023 (Originally 09/10/1974)   FOOT EXAM  04/06/2023 (Originally 11/28/2018)   COVID-19 Vaccine (4 - 2023-24 season) 04/18/2023 (Originally 07/27/2022)   HEMOGLOBIN A1C   05/03/2023 (Originally 10/05/2022)   Zoster Vaccines- Shingrix (1 of 2) 07/03/2023 (Originally 09/11/1975)   INFLUENZA VACCINE  06/27/2023   Diabetic kidney evaluation - eGFR measurement  12/07/2023   Medicare Annual Wellness (AWV)  04/01/2024   DTaP/Tdap/Td (3 - Td or Tdap) 04/21/2030   Pneumonia Vaccine 37+ Years old  Completed   Hepatitis C Screening  Completed   HPV VACCINES  Aged Out   Fecal DNA (Cologuard)  Discontinued       Assessment  This is a routine wellness examination for [employer].  Health Maintenance: Due or Overdue There are no preventive care reminders to display for this patient.   Rolan Bucco Wofford does not need a referral for [employer] Assistance: Care Management:   no Social Work:    no Prescription Assistance:  no Nutrition/Diabetes Education:  no   Plan:  Personalized Goals  Goals Addressed               This Visit's Progress     patient stated (pt-stated)        Patient stated that he would like to loose 50 lbs.       Personalized Health Maintenance & Screening Recommendations  Shingles vaccine Urine ACR Eye exam-need records Foot exam Colonoscopy - Completed in 2023- due in 2025  Lung Cancer Screening Recommended: no (Low Dose CT Chest recommended if Age 64-80 years, 30 pack-year currently smoking OR have quit w/in past 15 years) Hepatitis C Screening recommended: no HIV Screening recommended: no  Advanced Directives: Written information was not prepared per patient's request.  Referrals & Orders No orders of the defined types were placed in this encounter.   Follow-up Plan Follow-up with Everrett Coombe, DO as planned Schedule shingles vaccine at the pharmacy. Medicare wellness visit in one year.  Patient will access AVS on my chart.   I have personally reviewed and noted the following in the patient's chart:   Medical and social history Use of alcohol, tobacco or illicit drugs  Current medications and  supplements Functional ability and status Nutritional status Physical activity Advanced directives List of other physicians Hospitalizations, surgeries, and ER visits in previous 12 months Vitals Screenings to include cognitive, depression, and falls Referrals and appointments  In addition, I have reviewed and discussed with Jeffrey Larson certain preventive protocols, quality metrics, and best practice recommendations. A written personalized care plan for preventive services as well as general preventive health recommendations is available and can be mailed to the patient at his request.      Modesto Charon, RN BSN  04/02/2023

## 2023-04-03 ENCOUNTER — Ambulatory Visit (INDEPENDENT_AMBULATORY_CARE_PROVIDER_SITE_OTHER): Payer: HMO | Admitting: Family Medicine

## 2023-04-03 ENCOUNTER — Encounter: Payer: Self-pay | Admitting: Family Medicine

## 2023-04-03 VITALS — BP 111/72 | HR 70 | Ht 71.0 in | Wt 281.0 lb

## 2023-04-03 DIAGNOSIS — E782 Mixed hyperlipidemia: Secondary | ICD-10-CM | POA: Diagnosis not present

## 2023-04-03 DIAGNOSIS — I2510 Atherosclerotic heart disease of native coronary artery without angina pectoris: Secondary | ICD-10-CM | POA: Diagnosis not present

## 2023-04-03 DIAGNOSIS — R7303 Prediabetes: Secondary | ICD-10-CM | POA: Diagnosis not present

## 2023-04-03 DIAGNOSIS — I4811 Longstanding persistent atrial fibrillation: Secondary | ICD-10-CM | POA: Diagnosis not present

## 2023-04-03 DIAGNOSIS — I5042 Chronic combined systolic (congestive) and diastolic (congestive) heart failure: Secondary | ICD-10-CM

## 2023-04-03 DIAGNOSIS — Z0000 Encounter for general adult medical examination without abnormal findings: Secondary | ICD-10-CM

## 2023-04-03 DIAGNOSIS — I519 Heart disease, unspecified: Secondary | ICD-10-CM | POA: Diagnosis not present

## 2023-04-03 DIAGNOSIS — Z125 Encounter for screening for malignant neoplasm of prostate: Secondary | ICD-10-CM | POA: Diagnosis not present

## 2023-04-03 LAB — CBC WITH DIFFERENTIAL/PLATELET
Basophils Absolute: 62 cells/uL (ref 0–200)
Eosinophils Relative: 2.7 %
HCT: 42.9 % (ref 38.5–50.0)
Hemoglobin: 14.1 g/dL (ref 13.2–17.1)
MCV: 88.3 fL (ref 80.0–100.0)
Platelets: 279 10*3/uL (ref 140–400)
RDW: 13.3 % (ref 11.0–15.0)
WBC: 8.9 10*3/uL (ref 3.8–10.8)

## 2023-04-03 MED ORDER — SEMAGLUTIDE-WEIGHT MANAGEMENT 0.25 MG/0.5ML ~~LOC~~ SOAJ: 0.2500 mg | SUBCUTANEOUS | 0 refills | Status: AC

## 2023-04-03 MED ORDER — SEMAGLUTIDE-WEIGHT MANAGEMENT 1 MG/0.5ML ~~LOC~~ SOAJ: 1.0000 mg | SUBCUTANEOUS | 0 refills | Status: DC

## 2023-04-03 MED ORDER — SEMAGLUTIDE-WEIGHT MANAGEMENT 0.5 MG/0.5ML ~~LOC~~ SOAJ: 0.5000 mg | SUBCUTANEOUS | 0 refills | Status: AC

## 2023-04-03 NOTE — Progress Notes (Signed)
Jeffrey Larson - 67 y.o. male MRN [id number]  Date of birth: May 25, 1956  Subjective Chief Complaint  Patient presents with   Annual Exam    HPI Jeffrey Larson is a 67 y.o. male here today for follow up visit.   He reports that he is doing pretty well.  He was unable to continue Ozempic due to cost.  This was prescribed for pre-diabetes. He was doing quite well when taking this. He did not note any significant side effects from this at previous strength of 1mg .  He remains pretty active.  Admits diet could be better.    He is a non-smoker.  Denies EtOH use  Declines shingrix vaccine at this time.   Review of Systems  Constitutional:  Negative for chills, fever, malaise/fatigue and weight loss.  HENT:  Negative for congestion, ear pain and sore throat.   Eyes:  Negative for blurred vision, double vision and pain.  Respiratory:  Negative for cough and shortness of breath.   Cardiovascular:  Negative for chest pain and palpitations.  Gastrointestinal:  Negative for abdominal pain, blood in stool, constipation, heartburn and nausea.  Genitourinary:  Negative for dysuria and urgency.  Musculoskeletal:  Negative for joint pain and myalgias.  Neurological:  Negative for dizziness and headaches.  Endo/Heme/Allergies:  Does not bruise/bleed easily.  Psychiatric/Behavioral:  Negative for depression. The patient is not nervous/anxious and does not have insomnia.      Allergies  Allergen Reactions   Ramipril Cough    cough   Percocet [Oxycodone-Acetaminophen]     In ICU it contributed to hallucinations. Can take by itself    Past Medical History:  Diagnosis Date   Atrial fibrillation (HCC)    hx of   Cancer (HCC)    skin cancer removed from arm dec 2021 melanoma   Chronic combined systolic and diastolic congestive heart failure (HCC)    COPD with chronic bronchitis    Coronary artery disease    COVID-19 virus infection 06/2020   all symptoms resolved   Family history of  breast cancer    Family history of breast cancer    Family history of lung cancer    History of kidney stones    Hyperlipidemia    Hypertension    Longstanding persistent atrial fibrillation (HCC)    Mitral stenosis with regurgitation    Myocardial infarction (HCC) 2015   stents x 2   Obesity    Personal history of colonic polyps    Pneumonia 2015 and 06-2020   Pulmonary hypertension (HCC)    S/P Maze operation for atrial fibrillation 07/18/2018   Complete bilateral atrial lesion set using bipolar radiofrequency and cryothermy ablation with clipping of LA appendage   S/P mitral valve replacement with metallic valve 07/18/2018   Sorin Carbomedics Optiform bileaflet mechanical valve, size 33 mm   S/P TVR (tricuspid valve repair) 07/18/2018   Edwards mc3 ring annuloplasty, size 30 mm   Wears partial dentures    upper and lower    Past Surgical History:  Procedure Laterality Date   APPENDECTOMY     as a child   CARDIOVASCULAR STRESS TEST  03/2014   Borderline reversible ischemic changes at the apex.  Normal LV contractility/EF 52%.   CORONARY STENT PLACEMENT  7/12013   LEFT HEART CATHETERIZATION WITH CORONARY ANGIOGRAM N/A 06/12/2012   Procedure: LEFT HEART CATHETERIZATION WITH CORONARY ANGIOGRAM;  Surgeon: Robynn Pane, MD;  Location: [facility] CATH LAB;  Service: Cardiovascular;  Laterality: N/A;  MAZE N/A 07/18/2018   Procedure: MAZE;  Surgeon: Purcell Nails, MD;  Location: [facility] OR;  Service: Open Heart Surgery;  Laterality: N/A;   MITRAL VALVE REPLACEMENT N/A 07/18/2018   Procedure: MITRAL VALVE (MV) REPLACEMENT WITH CARBOMEDICS OPTIFORM MITRAL VALVE SIZE 33.;  Surgeon: Purcell Nails, MD;  Location: [facility] OR;  Service: Open Heart Surgery;  Laterality: N/A;   PERCUTANEOUS CORONARY STENT INTERVENTION (PCI-S) N/A 06/17/2012   Procedure: PERCUTANEOUS CORONARY STENT INTERVENTION (PCI-S);  Surgeon: Robynn Pane, MD;  Location: [facility] CATH LAB;  Service: Cardiovascular;  Laterality: N/A;   RECTAL  EXAM UNDER ANESTHESIA N/A 12/15/2020   Procedure: ANORECTAL EXAM UNDER ANESTHESIA EXCISION OF ANAL CANAL POLYP;  Surgeon: Andria Meuse, MD;  Location: [facility];  Service: General;  Laterality: N/A;   RIGHT/LEFT HEART CATH AND CORONARY ANGIOGRAPHY N/A 06/10/2018   Procedure: RIGHT/LEFT HEART CATH AND CORONARY ANGIOGRAPHY;  Surgeon: Orpah Cobb, MD;  Location: [facility] INVASIVE CV LAB;  Service: Cardiovascular;  Laterality: N/A;   TEE WITHOUT CARDIOVERSION N/A 06/10/2018   Procedure: TRANSESOPHAGEAL ECHOCARDIOGRAM (TEE) with bubble study;  Surgeon: Orpah Cobb, MD;  Location: [facility] ENDOSCOPY;  Service: Cardiovascular;  Laterality: N/A;   TEE WITHOUT CARDIOVERSION N/A 07/18/2018   Procedure: TRANSESOPHAGEAL ECHOCARDIOGRAM (TEE);  Surgeon: Purcell Nails, MD;  Location: [facility] OR;  Service: Open Heart Surgery;  Laterality: N/A;   TRICUSPID VALVE REPLACEMENT N/A 07/18/2018   Procedure: TRICUSPID VALVE REPAIR WITH EDWARDS MC3 TRICUSPID ANNULOPLASTY RING SIZE 30.;  Surgeon: Purcell Nails, MD;  Location: [facility] OR;  Service: Open Heart Surgery;  Laterality: N/A;    Social History   Socioeconomic History   Marital status: Married    Spouse name: Jeffrey Larson   Number of children: 2   Years of education: 9   Highest education level: 9th grade  Occupational History   Occupation: Retired.  Tobacco Use   Smoking status: Former    Packs/day: 2.00    Years: 15.00    Additional pack years: 0.00    Total pack years: 30.00    Types: Cigarettes    Quit date: 11/26/1996    Years since quitting: 26.3   Smokeless tobacco: Never  Vaping Use   Vaping Use: Never used  Substance and Sexual Activity   Alcohol use: No   Drug use: No   Sexual activity: Yes  Other Topics Concern   Not on file  Social History Narrative   Lives with his wife. He has two children. He has a farm and stays busy. He enjoys riding horses and going fishing on his boat.   Social Determinants of Health   Financial Resource  Strain: Low Risk  (04/02/2023)   Overall Financial Resource Strain (CARDIA)    Difficulty of Paying Living Expenses: Not hard at all  Food Insecurity: No Food Insecurity (04/02/2023)   Hunger Vital Sign    Worried About Running Out of Food in the Last Year: Never true    Ran Out of Food in the Last Year: Never true  Transportation Needs: No Transportation Needs (04/02/2023)   PRAPARE - Administrator, Civil Service (Medical): No    Lack of Transportation (Non-Medical): No  Physical Activity: Sufficiently Active (04/02/2023)   Exercise Vital Sign    Days of Exercise per Week: 7 days    Minutes of Exercise per Session: 30 min  Stress: No Stress Concern Present (04/02/2023)   [employer] of Occupational Health - Occupational Stress Questionnaire    Feeling  of Stress : Not at all  Social Connections: Moderately Integrated (04/02/2023)   Social Connection and Isolation Panel [NHANES]    Frequency of Communication with Friends and Family: More than three times a week    Frequency of Social Gatherings with Friends and Family: Once a week    Attends Religious Services: More than 4 times per year    Active Member of Clubs or Organizations: No    Attends Banker Meetings: Never    Marital Status: Married    Family History  Problem Relation Age of Onset   Heart disease Mother    Heart disease Father    Diabetes Father    Diabetes Brother    Breast cancer Maternal Aunt 69   Cancer Paternal Aunt        unk type, tumor on head   Lung cancer Cousin 15   Cancer Cousin 38   Colon cancer Neg Hx    Esophageal cancer Neg Hx    Stomach cancer Neg Hx     Health Maintenance  Topic Date Due   Diabetic kidney evaluation - Urine ACR  04/03/2023 (Originally 09/10/1974)   FOOT EXAM  04/06/2023 (Originally 11/28/2018)   COVID-19 Vaccine (4 - 2023-24 season) 04/18/2023 (Originally 07/27/2022)   HEMOGLOBIN A1C  05/03/2023 (Originally 10/05/2022)   Zoster Vaccines- Shingrix (1 of 2)  07/03/2023 (Originally 09/11/1975)   OPHTHALMOLOGY EXAM  10/04/2023 (Originally 06/05/2017)   INFLUENZA VACCINE  06/27/2023   Diabetic kidney evaluation - eGFR measurement  12/07/2023   Medicare Annual Wellness (AWV)  04/01/2024   DTaP/Tdap/Td (3 - Td or Tdap) 04/21/2030   Pneumonia Vaccine 8+ Years old  Completed   Hepatitis C Screening  Completed   HPV VACCINES  Aged Out   Fecal DNA (Cologuard)  Discontinued     ----------------------------------------------------------------------------------------------------------------------------------------------------------------------------------------------------------------- Physical Exam BP 111/72 (BP Location: Left Arm, Patient Position: Sitting, Cuff Size: Large)   Pulse 70   Ht 5\' 11"  (1.803 m)   Wt 281 lb (127.5 kg)   SpO2 97%   BMI 39.19 kg/m   Physical Exam Constitutional:      General: He is not in acute distress. HENT:     Head: Normocephalic and atraumatic.     Right Ear: Tympanic membrane and external ear normal.     Left Ear: Tympanic membrane and external ear normal.  Eyes:     General: No scleral icterus. Neck:     Thyroid: No thyromegaly.  Cardiovascular:     Rate and Rhythm: Normal rate and regular rhythm.     Heart sounds: Normal heart sounds.  Pulmonary:     Effort: Pulmonary effort is normal.     Breath sounds: Normal breath sounds.  Abdominal:     General: Bowel sounds are normal. There is no distension.     Palpations: Abdomen is soft.     Tenderness: There is no abdominal tenderness. There is no guarding.  Musculoskeletal:     Cervical back: Normal range of motion.  Lymphadenopathy:     Cervical: No cervical adenopathy.  Skin:    General: Skin is warm and dry.     Findings: No rash.  Neurological:     Mental Status: He is alert and oriented to person, place, and time.     Cranial Nerves: No cranial nerve deficit.     Motor: No abnormal muscle tone.  Psychiatric:        Mood and Affect: Mood  normal.  Behavior: Behavior normal.     ------------------------------------------------------------------------------------------------------------------------------------------------------------------------------------------------------------------- Assessment and Plan  Cardiac disease He has significant cardiovascular disease along with co-morbidities of obesity and pre-diabetes.  I think we would benefit from adding [facility] on.   Well adult exam Well adult Orders Placed This Encounter  Procedures   COMPLETE METABOLIC PANEL WITH GFR   CBC with Differential   Lipid Panel w/reflex Direct LDL   TSH   PSA   HgB A1c  Screenings: per lab orders Immunizations: Declines Anticipatory guidance/Risk factor reduction:  Recommendations per AVS.    Meds ordered this encounter  Medications   Semaglutide-Weight Management 0.25 MG/0.5ML SOAJ    Sig: Inject 0.25 mg into the skin once a week for 28 days.    Dispense:  2 mL    Refill:  0   Semaglutide-Weight Management 0.5 MG/0.5ML SOAJ    Sig: Inject 0.5 mg into the skin once a week for 28 days.    Dispense:  2 mL    Refill:  0   Semaglutide-Weight Management 1 MG/0.5ML SOAJ    Sig: Inject 1 mg into the skin once a week for 28 days.    Dispense:  2 mL    Refill:  0    No follow-ups on file.    This visit occurred during the SARS-CoV-2 public health emergency.  Safety protocols were in place, including screening questions prior to the visit, additional usage of staff PPE, and extensive cleaning of exam room while observing appropriate contact time as indicated for disinfecting solutions.

## 2023-04-03 NOTE — Patient Instructions (Signed)
Preventive Care 65 Years and Older, Male Preventive care refers to lifestyle choices and visits with your health care provider that can promote health and wellness. Preventive care visits are also called wellness exams. What can I expect for my preventive care visit? Counseling During your preventive care visit, your health care provider may ask about your: Medical history, including: Past medical problems. Family medical history. History of falls. Current health, including: Emotional well-being. Home life and relationship well-being. Sexual activity. Memory and ability to understand (cognition). Lifestyle, including: Alcohol, nicotine or tobacco, and drug use. Access to firearms. Diet, exercise, and sleep habits. Work and work environment. Sunscreen use. Safety issues such as seatbelt and bike helmet use. Physical exam Your health care provider will check your: Height and weight. These may be used to calculate your BMI (body mass index). BMI is a measurement that tells if you are at a healthy weight. Waist circumference. This measures the distance around your waistline. This measurement also tells if you are at a healthy weight and may help predict your risk of certain diseases, such as type 2 diabetes and high blood pressure. Heart rate and blood pressure. Body temperature. Skin for abnormal spots. What immunizations do I need?  Vaccines are usually given at various ages, according to a schedule. Your health care provider will recommend vaccines for you based on your age, medical history, and lifestyle or other factors, such as travel or where you work. What tests do I need? Screening Your health care provider may recommend screening tests for certain conditions. This may include: Lipid and cholesterol levels. Diabetes screening. This is done by checking your blood sugar (glucose) after you have not eaten for a while (fasting). Hepatitis C test. Hepatitis B test. HIV (human  immunodeficiency virus) test. STI (sexually transmitted infection) testing, if you are at risk. Lung cancer screening. Colorectal cancer screening. Prostate cancer screening. Abdominal aortic aneurysm (AAA) screening. You may need this if you are a current or former smoker. Talk with your health care provider about your test results, treatment options, and if necessary, the need for more tests. Follow these instructions at home: Eating and drinking  Eat a diet that includes fresh fruits and vegetables, whole grains, lean protein, and low-fat dairy products. Limit your intake of foods with high amounts of sugar, saturated fats, and salt. Take vitamin and mineral supplements as recommended by your health care provider. Do not drink alcohol if your health care provider tells you not to drink. If you drink alcohol: Limit how much you have to 0-2 drinks a day. Know how much alcohol is in your drink. In the [place] one drink equals one 12 oz bottle of beer (355 mL), one 5 oz glass of wine (148 mL), or one 1 oz glass of hard liquor (44 mL). Lifestyle Brush your teeth every morning and night with fluoride toothpaste. Floss one time each day. Exercise for at least 30 minutes 5 or more days each week. Do not use any products that contain nicotine or tobacco. These products include cigarettes, chewing tobacco, and vaping devices, such as e-cigarettes. If you need help quitting, ask your health care provider. Do not use drugs. If you are sexually active, practice safe sex. Use a condom or other form of protection to prevent STIs. Take aspirin only as told by your health care provider. Make sure that you understand how much to take and what form to take. Work with your health care provider to find out whether it is safe   and beneficial for you to take aspirin daily. Ask your health care provider if you need to take a cholesterol-lowering medicine (statin). Find healthy ways to manage stress, such  as: Meditation, yoga, or listening to music. Journaling. Talking to a trusted person. Spending time with friends and family. Safety Always wear your seat belt while driving or riding in a vehicle. Do not drive: If you have been drinking alcohol. Do not ride with someone who has been drinking. When you are tired or distracted. While texting. If you have been using any mind-altering substances or drugs. Wear a helmet and other protective equipment during sports activities. If you have firearms in your house, make sure you follow all gun safety procedures. Minimize exposure to UV radiation to reduce your risk of skin cancer. What's next? Visit your health care provider once a year for an annual wellness visit. Ask your health care provider how often you should have your eyes and teeth checked. Stay up to date on all vaccines. This information is not intended to replace advice given to you by your health care provider. Make sure you discuss any questions you have with your health care provider. Document Revised: 05/10/2021 Document Reviewed: 05/10/2021 Elsevier Patient Education  2023 Elsevier Inc.  

## 2023-04-03 NOTE — Assessment & Plan Note (Signed)
Well adult Orders Placed This Encounter  Procedures   COMPLETE METABOLIC PANEL WITH GFR   CBC with Differential   Lipid Panel w/reflex Direct LDL   TSH   PSA   HgB A1c  Screenings: per lab orders Immunizations: Declines Anticipatory guidance/Risk factor reduction:  Recommendations per AVS.

## 2023-04-03 NOTE — Assessment & Plan Note (Signed)
He has significant cardiovascular disease along with co-morbidities of obesity and pre-diabetes.  I think we would benefit from adding [facility] on.

## 2023-04-04 LAB — COMPLETE METABOLIC PANEL WITH GFR
AG Ratio: 1.7 (calc) (ref 1.0–2.5)
ALT: 20 U/L (ref 9–46)
AST: 19 U/L (ref 10–35)
Albumin: 4.4 g/dL (ref 3.6–5.1)
Alkaline phosphatase (APISO): 61 U/L (ref 35–144)
BUN: 18 mg/dL (ref 7–25)
CO2: 26 mmol/L (ref 20–32)
Calcium: 9.3 mg/dL (ref 8.6–10.3)
Chloride: 104 mmol/L (ref 98–110)
Creat: 0.99 mg/dL (ref 0.70–1.35)
Globulin: 2.6 g/dL (calc) (ref 1.9–3.7)
Glucose, Bld: 112 mg/dL — ABNORMAL HIGH (ref 65–99)
Potassium: 4.6 mmol/L (ref 3.5–5.3)
Sodium: 140 mmol/L (ref 135–146)
Total Bilirubin: 0.6 mg/dL (ref 0.2–1.2)
Total Protein: 7 g/dL (ref 6.1–8.1)
eGFR: 84 mL/min/{1.73_m2} (ref >=60)

## 2023-04-04 LAB — LIPID PANEL W/REFLEX DIRECT LDL
Cholesterol: 146 mg/dL (ref <200)
HDL: 31 mg/dL — ABNORMAL LOW (ref >=40)
LDL Cholesterol (Calc): 77 mg/dL (calc)
Non-HDL Cholesterol (Calc): 115 mg/dL (calc) (ref <130)
Total CHOL/HDL Ratio: 4.7 (calc) (ref <5.0)
Triglycerides: 313 mg/dL — ABNORMAL HIGH (ref <150)

## 2023-04-04 LAB — CBC WITH DIFFERENTIAL/PLATELET
Absolute Monocytes: 703 cells/uL (ref 200–950)
Basophils Relative: 0.7 %
Eosinophils Absolute: 240 cells/uL (ref 15–500)
Lymphs Abs: 1780 cells/uL (ref 850–3900)
MCH: 29 pg (ref 27.0–33.0)
MCHC: 32.9 g/dL (ref 32.0–36.0)
MPV: 10.3 fL (ref 7.5–12.5)
Monocytes Relative: 7.9 %
Neutro Abs: 6114 cells/uL (ref 1500–7800)
Neutrophils Relative %: 68.7 %
RBC: 4.86 10*6/uL (ref 4.20–5.80)
Total Lymphocyte: 20 %

## 2023-04-04 LAB — HEMOGLOBIN A1C
Hgb A1c MFr Bld: 5.5 % of total Hgb (ref <5.7)
Mean Plasma Glucose: 111 mg/dL
eAG (mmol/L): 6.2 mmol/L

## 2023-04-04 LAB — PSA: PSA: 0.39 ng/mL (ref <=4.00)

## 2023-04-04 LAB — TSH: TSH: 1.33 mIU/L (ref 0.40–4.50)

## 2023-04-15 ENCOUNTER — Telehealth: Payer: Self-pay | Admitting: Family Medicine

## 2023-04-15 NOTE — Telephone Encounter (Signed)
Patient called and stated he needed a pa resubmitted on Semaglutide-Weight Mangement 1.mg/0.76ml  due to insurance changes

## 2023-04-17 ENCOUNTER — Ambulatory Visit: Payer: HMO | Attending: Cardiology | Admitting: *Deleted

## 2023-04-17 DIAGNOSIS — I4891 Unspecified atrial fibrillation: Secondary | ICD-10-CM | POA: Diagnosis not present

## 2023-04-17 DIAGNOSIS — Z954 Presence of other heart-valve replacement: Secondary | ICD-10-CM | POA: Diagnosis not present

## 2023-04-17 DIAGNOSIS — Z5181 Encounter for therapeutic drug level monitoring: Secondary | ICD-10-CM | POA: Diagnosis not present

## 2023-04-17 LAB — POCT INR: INR: 2.9 (ref 2.0–3.0)

## 2023-04-17 NOTE — Patient Instructions (Signed)
Continue warfarin 1 tablet daily Recheck in 4 wks 

## 2023-04-18 NOTE — Telephone Encounter (Signed)
Submitted PA for Ozempic to insurance. Response due within 72 hrs.

## 2023-05-06 ENCOUNTER — Telehealth: Payer: Self-pay | Admitting: Family Medicine

## 2023-05-06 DIAGNOSIS — I5042 Chronic combined systolic (congestive) and diastolic (congestive) heart failure: Secondary | ICD-10-CM

## 2023-05-06 MED ORDER — TORSEMIDE 20 MG PO TABS
ORAL_TABLET | ORAL | 5 refills | Status: DC
Start: 2023-05-06 — End: 2023-10-21

## 2023-05-06 NOTE — Telephone Encounter (Signed)
Rx sent to [facility] 

## 2023-05-06 NOTE — Telephone Encounter (Signed)
Pt called.  He is requesting a refill on his fluid medication. Pharmacy: [facility]

## 2023-05-15 ENCOUNTER — Ambulatory Visit: Payer: HMO | Attending: Cardiology | Admitting: *Deleted

## 2023-05-15 DIAGNOSIS — Z5181 Encounter for therapeutic drug level monitoring: Secondary | ICD-10-CM

## 2023-05-15 DIAGNOSIS — I4891 Unspecified atrial fibrillation: Secondary | ICD-10-CM | POA: Diagnosis not present

## 2023-05-15 DIAGNOSIS — Z954 Presence of other heart-valve replacement: Secondary | ICD-10-CM | POA: Diagnosis not present

## 2023-05-15 LAB — POCT INR: INR: 2.8 (ref 2.0–3.0)

## 2023-05-15 NOTE — Patient Instructions (Signed)
Continue warfarin 1 tablet daily Recheck in 4 wks 

## 2023-06-13 ENCOUNTER — Ambulatory Visit (INDEPENDENT_AMBULATORY_CARE_PROVIDER_SITE_OTHER): Payer: HMO | Admitting: *Deleted

## 2023-06-13 ENCOUNTER — Ambulatory Visit: Payer: HMO | Attending: Cardiology | Admitting: Cardiology

## 2023-06-13 ENCOUNTER — Encounter: Payer: Self-pay | Admitting: Cardiology

## 2023-06-13 VITALS — BP 116/78 | HR 64 | Ht 71.0 in | Wt 279.6 lb

## 2023-06-13 DIAGNOSIS — I2510 Atherosclerotic heart disease of native coronary artery without angina pectoris: Secondary | ICD-10-CM | POA: Diagnosis not present

## 2023-06-13 DIAGNOSIS — Z954 Presence of other heart-valve replacement: Secondary | ICD-10-CM

## 2023-06-13 DIAGNOSIS — E782 Mixed hyperlipidemia: Secondary | ICD-10-CM

## 2023-06-13 DIAGNOSIS — I4891 Unspecified atrial fibrillation: Secondary | ICD-10-CM

## 2023-06-13 DIAGNOSIS — Z5181 Encounter for therapeutic drug level monitoring: Secondary | ICD-10-CM

## 2023-06-13 LAB — POCT INR: INR: 3 (ref 2.0–3.0)

## 2023-06-13 MED ORDER — ATORVASTATIN CALCIUM 80 MG PO TABS
80.0000 mg | ORAL_TABLET | Freq: Every evening | ORAL | 1 refills | Status: DC
Start: 2023-06-13 — End: 2023-12-09

## 2023-06-13 MED ORDER — METOPROLOL SUCCINATE ER 25 MG PO TB24: 25.0000 mg | ORAL_TABLET | Freq: Every day | ORAL | 1 refills | Status: DC

## 2023-06-13 NOTE — Patient Instructions (Signed)
Continue warfarin 1 tablet daily °Recheck in 6 wks °

## 2023-06-13 NOTE — Progress Notes (Signed)
Clinical Summary Mr. Milich is a 67 y.o.male seen today for follow up of the following medical problems.      1. History of MVR for rheumatic mitral stenosis - On 07/18/2018 he underwent mitral valve replacement with mechanical mitral valve, tricuspid valve repair using an Edwards mc3 ring annuloplasty , and Maze procedure -Sorin Carbomedics Optiform bileaflet 33 mm mechanical valve on 07/18/2018.    - Jan 2020 echo LVEF 55-60%, normal MVR     - no SOB/DOE, no recent edema       2. Permanent afib Denies palpitations - no bleeding no coumadin   3. CAD - MI in 2013 with stenting   - He underwent cardiac catheterization on 06/10/2018.  He had nonobstructive disease with a mid RCA 50% stenosis and otherwise 20% stenosis seen in the proximal left circumflex and ostial to proximal LAD.  - no recent chest pains   4. Chronic diastolic HF - taking torsemide 20mg  daily, denies any LE edema  - had some prior LV dysfunction, 05/2018 TEE LVEF 35-40% - Jan 2020 echo 55-60%   - no SOB/DOE, no LE edema   5. COPD       6. Pulmonary HTN - has been on sildenafil per CHF clinic   7. HTN - he remains compliant with med   8. Hyperlipidemia - LDL has been at goal, was 61 in 05/2020  - lbas followed by pcp, he is on atorvastatin 40mg  daily   05/2021 TC 113 TG 153 HDL 29 LDL 60 03/2023 TC 146 HDL 31 TG 313 LDL 77   Past Medical History:  Diagnosis Date   Atrial fibrillation (HCC)    hx of   Cancer (HCC)    skin cancer removed from arm dec 2021 melanoma   Chronic combined systolic and diastolic congestive heart failure (HCC)    COPD with chronic bronchitis    Coronary artery disease    COVID-19 virus infection 06/2020   all symptoms resolved   Family history of breast cancer    Family history of breast cancer    Family history of lung cancer    History of kidney stones    Hyperlipidemia    Hypertension    Longstanding persistent atrial fibrillation (HCC)    Mitral  stenosis with regurgitation    Myocardial infarction (HCC) 2015   stents x 2   Obesity    Personal history of colonic polyps    Pneumonia 2015 and 06-2020   Pulmonary hypertension (HCC)    S/P Maze operation for atrial fibrillation 07/18/2018   Complete bilateral atrial lesion set using bipolar radiofrequency and cryothermy ablation with clipping of LA appendage   S/P mitral valve replacement with metallic valve 07/18/2018   Sorin Carbomedics Optiform bileaflet mechanical valve, size 33 mm   S/P TVR (tricuspid valve repair) 07/18/2018   Edwards mc3 ring annuloplasty, size 30 mm   Wears partial dentures    upper and lower     Allergies  Allergen Reactions   Ramipril Cough    cough   Percocet [Oxycodone-Acetaminophen]     In ICU it contributed to hallucinations. Can take by itself     Current Outpatient Medications  Medication Sig Dispense Refill   acetaminophen (TYLENOL) 500 MG tablet Take 1,000 mg by mouth 2 (two) times daily.     alclomethasone (ACLOVATE) 0.05 % cream Apply topically 2 (two) times daily as needed.     aspirin 81 MG tablet Take 81 mg by  mouth daily.     atorvastatin (LIPITOR) 40 MG tablet Take 1 tablet (40 mg total) by mouth every evening. 90 tablet 3   clindamycin (CLINDAGEL) 1 % gel Apply topically.     metoprolol succinate (TOPROL-XL) 25 MG 24 hr tablet TAKE 1 TABLET(25 MG) BY MOUTH DAILY 90 tablet 3   sacubitril-valsartan (ENTRESTO) 24-26 MG Take 1 tablet by mouth 2 (two) times daily. 180 tablet 1   Semaglutide-Weight Management 1 MG/0.5ML SOAJ Inject 1 mg into the skin once a week for 28 days. 2 mL 0   sildenafil (REVATIO) 20 MG tablet TAKE 1 TABLET BY MOUTH THREE TIMES DAILY 270 tablet 3   torsemide (DEMADEX) 20 MG tablet TAKE 2 TABLET BY MOUTH EVERY DAY 60 tablet 5   warfarin (COUMADIN) 4 MG tablet TAKE 1 TABLET BY MOUTH DAILY OR AS DIRECTED BY COUMADIN CLINIC 90 tablet 0   No current facility-administered medications for this visit.     Past Surgical  History:  Procedure Laterality Date   APPENDECTOMY     as a child   CARDIOVASCULAR STRESS TEST  03/2014   Borderline reversible ischemic changes at the apex.  Normal LV contractility/EF 52%.   CORONARY STENT PLACEMENT  7/12013   LEFT HEART CATHETERIZATION WITH CORONARY ANGIOGRAM N/A 06/12/2012   Procedure: LEFT HEART CATHETERIZATION WITH CORONARY ANGIOGRAM;  Surgeon: Robynn Pane, MD;  Location: [facility] CATH LAB;  Service: Cardiovascular;  Laterality: N/A;   MAZE N/A 07/18/2018   Procedure: MAZE;  Surgeon: Purcell Nails, MD;  Location: [facility] OR;  Service: Open Heart Surgery;  Laterality: N/A;   MITRAL VALVE REPLACEMENT N/A 07/18/2018   Procedure: MITRAL VALVE (MV) REPLACEMENT WITH CARBOMEDICS OPTIFORM MITRAL VALVE SIZE 33.;  Surgeon: Purcell Nails, MD;  Location: [facility] OR;  Service: Open Heart Surgery;  Laterality: N/A;   PERCUTANEOUS CORONARY STENT INTERVENTION (PCI-S) N/A 06/17/2012   Procedure: PERCUTANEOUS CORONARY STENT INTERVENTION (PCI-S);  Surgeon: Robynn Pane, MD;  Location: [facility] CATH LAB;  Service: Cardiovascular;  Laterality: N/A;   RECTAL EXAM UNDER ANESTHESIA N/A 12/15/2020   Procedure: ANORECTAL EXAM UNDER ANESTHESIA EXCISION OF ANAL CANAL POLYP;  Surgeon: Andria Meuse, MD;  Location: [facility];  Service: General;  Laterality: N/A;   RIGHT/LEFT HEART CATH AND CORONARY ANGIOGRAPHY N/A 06/10/2018   Procedure: RIGHT/LEFT HEART CATH AND CORONARY ANGIOGRAPHY;  Surgeon: Orpah Cobb, MD;  Location: [facility] INVASIVE CV LAB;  Service: Cardiovascular;  Laterality: N/A;   TEE WITHOUT CARDIOVERSION N/A 06/10/2018   Procedure: TRANSESOPHAGEAL ECHOCARDIOGRAM (TEE) with bubble study;  Surgeon: Orpah Cobb, MD;  Location: [facility] ENDOSCOPY;  Service: Cardiovascular;  Laterality: N/A;   TEE WITHOUT CARDIOVERSION N/A 07/18/2018   Procedure: TRANSESOPHAGEAL ECHOCARDIOGRAM (TEE);  Surgeon: Purcell Nails, MD;  Location: [facility] OR;  Service: Open Heart Surgery;  Laterality: N/A;   TRICUSPID  VALVE REPLACEMENT N/A 07/18/2018   Procedure: TRICUSPID VALVE REPAIR WITH EDWARDS MC3 TRICUSPID ANNULOPLASTY RING SIZE 30.;  Surgeon: Purcell Nails, MD;  Location: [facility] OR;  Service: Open Heart Surgery;  Laterality: N/A;     Allergies  Allergen Reactions   Ramipril Cough    cough   Percocet [Oxycodone-Acetaminophen]     In ICU it contributed to hallucinations. Can take by itself      Family History  Problem Relation Age of Onset   Heart disease Mother    Heart disease Father    Diabetes Father    Diabetes Brother    Breast cancer Maternal Aunt 18  Cancer Paternal Aunt        unk type, tumor on head   Lung cancer Cousin 18   Cancer Cousin 38   Colon cancer Neg Hx    Esophageal cancer Neg Hx    Stomach cancer Neg Hx      Social History Mr. Paskett reports that he quit smoking about 26 years ago. His smoking use included cigarettes. He started smoking about 41 years ago. He has a 30 pack-year smoking history. He has never used smokeless tobacco. Mr. Marcott reports no history of alcohol use.   Review of Systems CONSTITUTIONAL: No weight loss, fever, chills, weakness or fatigue.  HEENT: Eyes: No visual loss, blurred vision, double vision or yellow sclerae.No hearing loss, sneezing, congestion, runny nose or sore throat.  SKIN: No rash or itching.  CARDIOVASCULAR: per hpi RESPIRATORY: No shortness of breath, cough or sputum.  GASTROINTESTINAL: No anorexia, nausea, vomiting or diarrhea. No abdominal pain or blood.  GENITOURINARY: No burning on urination, no polyuria NEUROLOGICAL: No headache, dizziness, syncope, paralysis, ataxia, numbness or tingling in the extremities. No change in bowel or bladder control.  MUSCULOSKELETAL: No muscle, back pain, joint pain or stiffness.  LYMPHATICS: No enlarged nodes. No history of splenectomy.  PSYCHIATRIC: No history of depression or anxiety.  ENDOCRINOLOGIC: No reports of sweating, cold or heat intolerance. No polyuria or  polydipsia.  Marland Kitchen   Physical Examination Today's Vitals   06/13/23 0815  BP: 116/78  Pulse: 64  SpO2: 97%  Weight: 279 lb 9.6 oz (126.8 kg)  Height: 5\' 11"  (1.803 m)   Body mass index is 39 kg/m.  Gen: resting comfortably, no acute distress HEENT: no scleral icterus, pupils equal round and reactive, no palptable cervical adenopathy,  CV: RRR, no m/r/g, no jvd Resp: Clear to auscultation bilaterally GI: abdomen is soft, non-tender, non-distended, normal bowel sounds, no hepatosplenomegaly MSK: extremities are warm, no edema.  Skin: warm, no rash Neuro:  no focal deficits Psych: appropriate affect   Diagnostic Studies     Assessment and Plan   1. History of MV replacment/mechnical MV -no symptoms, doing well nearly 5 years since surgery - continue current meds including coumadin and ASA for mechanical valve     2. Afib/acquired thrombophilia - on coumadin in setting of afib and mechanical MV - no symptoms, continue curren tmeds EKG junction brady 47 asymptomatic, rates on exam in 60s. Continue current meds at this time   3. Hyperlipidemia - above goal LDL of <70, increase atorvastatin to 80mg  daily   4. CAD - denies recent symptoms, continue current meds   5. Chronic diastolic - he is euvolemic, continue torsemide at current dose  F/u 6 months     Antoine Poche, M.D.

## 2023-06-13 NOTE — Patient Instructions (Signed)
Medication Instructions:  Increase Atorvastatin to 80mg daily Continue all other medications.     Labwork: none  Testing/Procedures: none  Follow-Up: 6 months   Any Other Special Instructions Will Be Listed Below (If Applicable).   If you need a refill on your cardiac medications before your next appointment, please call your pharmacy.  

## 2023-06-17 MED ORDER — ENTRESTO 24-26 MG PO TABS: 1.0000 | ORAL_TABLET | Freq: Two times a day (BID) | ORAL | Status: DC

## 2023-06-17 NOTE — Addendum Note (Signed)
Addended by: Lesle Chris on: 06/17/2023 01:35 PM   Modules accepted: Orders

## 2023-06-25 ENCOUNTER — Telehealth: Payer: Self-pay | Admitting: Cardiology

## 2023-06-25 NOTE — Telephone Encounter (Signed)
Spoke with [employer]. Pt has been approved to receive Sherryll Burger free of charge through December.

## 2023-06-25 NOTE — Telephone Encounter (Signed)
Patient states he is returning a call. 

## 2023-06-25 NOTE — Telephone Encounter (Signed)
Pt notified that he has been approved and needs to call [employer] at [phone]. Pt thankful for the call

## 2023-06-25 NOTE — Telephone Encounter (Signed)
Pt is requesting a callback regarding paperwork he gave to MD at his last office visit on 7/18 for patient assistance with Entresto. Please advise

## 2023-07-05 ENCOUNTER — Other Ambulatory Visit: Payer: Self-pay | Admitting: Cardiology

## 2023-07-05 DIAGNOSIS — Z954 Presence of other heart-valve replacement: Secondary | ICD-10-CM

## 2023-07-08 NOTE — Telephone Encounter (Signed)
Refill request for warfarin:  Last INR was 3.0 on 06/13/23 Next INR due 07/25/23 LOV was 06/13/23  Refill approved.

## 2023-07-25 ENCOUNTER — Ambulatory Visit: Payer: HMO | Attending: Cardiology | Admitting: *Deleted

## 2023-07-25 DIAGNOSIS — Z954 Presence of other heart-valve replacement: Secondary | ICD-10-CM | POA: Diagnosis not present

## 2023-07-25 DIAGNOSIS — I4891 Unspecified atrial fibrillation: Secondary | ICD-10-CM

## 2023-07-25 DIAGNOSIS — Z5181 Encounter for therapeutic drug level monitoring: Secondary | ICD-10-CM | POA: Diagnosis not present

## 2023-07-25 LAB — POCT INR: POC INR: 2.7

## 2023-07-25 NOTE — Patient Instructions (Signed)
Description   Continue warfarin 1 tablet daily. Recheck in 6 wks

## 2023-09-05 ENCOUNTER — Ambulatory Visit: Payer: HMO | Attending: Cardiology | Admitting: *Deleted

## 2023-09-05 DIAGNOSIS — I4891 Unspecified atrial fibrillation: Secondary | ICD-10-CM | POA: Diagnosis not present

## 2023-09-05 DIAGNOSIS — Z5181 Encounter for therapeutic drug level monitoring: Secondary | ICD-10-CM | POA: Diagnosis not present

## 2023-09-05 DIAGNOSIS — Z954 Presence of other heart-valve replacement: Secondary | ICD-10-CM

## 2023-09-05 LAB — POCT INR: INR: 2.7 (ref 2.0–3.0)

## 2023-09-05 NOTE — Patient Instructions (Signed)
Continue warfarin 1 tablet daily °Recheck in 6 wks °

## 2023-10-07 ENCOUNTER — Ambulatory Visit (INDEPENDENT_AMBULATORY_CARE_PROVIDER_SITE_OTHER): Payer: HMO | Admitting: Family Medicine

## 2023-10-07 ENCOUNTER — Encounter: Payer: Self-pay | Admitting: Family Medicine

## 2023-10-07 VITALS — BP 119/67 | HR 61 | Ht 71.0 in | Wt 278.0 lb

## 2023-10-07 DIAGNOSIS — I5042 Chronic combined systolic (congestive) and diastolic (congestive) heart failure: Secondary | ICD-10-CM | POA: Diagnosis not present

## 2023-10-07 DIAGNOSIS — R7303 Prediabetes: Secondary | ICD-10-CM | POA: Diagnosis not present

## 2023-10-07 DIAGNOSIS — E782 Mixed hyperlipidemia: Secondary | ICD-10-CM

## 2023-10-07 DIAGNOSIS — I10 Essential (primary) hypertension: Secondary | ICD-10-CM | POA: Diagnosis not present

## 2023-10-07 DIAGNOSIS — I4811 Longstanding persistent atrial fibrillation: Secondary | ICD-10-CM | POA: Diagnosis not present

## 2023-10-07 NOTE — Assessment & Plan Note (Signed)
BP is well controlled at this time.  Recommend continuation of current medications.

## 2023-10-07 NOTE — Assessment & Plan Note (Signed)
Doing well.  Blood sugars have remained well controlled.

## 2023-10-07 NOTE — Progress Notes (Signed)
Jeffrey Larson - 67 y.o. male MRN [id number]  Date of birth: 1956/08/14  Subjective No chief complaint on file.   HPI Jeffrey Larson is a 67 y.o. male here today for follow up visit.   He reports that he is doing pretty well   He continues on combination of entresto and toprol for history of CHF and A. Fib.  He is s/p mitral valve replacement. He is anticoagulated with warfarin. This is being managed by cardiology.  He denies new or worsening symptoms.    He does have history of prediabetes but has been managing well with diet.  His most recent A1c levels have been our of the pre-diabetes range.   ROS:  A comprehensive ROS was completed and negative except as noted per HPI  Allergies  Allergen Reactions   Ramipril Cough    cough   Percocet [Oxycodone-Acetaminophen]     In ICU it contributed to hallucinations. Can take by itself    Past Medical History:  Diagnosis Date   Atrial fibrillation (HCC)    hx of   Cancer (HCC)    skin cancer removed from arm dec 2021 melanoma   Chronic combined systolic and diastolic congestive heart failure (HCC)    COPD with chronic bronchitis (HCC)    Coronary artery disease    COVID-19 virus infection 06/2020   all symptoms resolved   Family history of breast cancer    Family history of breast cancer    Family history of lung cancer    History of kidney stones    Hyperlipidemia    Hypertension    Longstanding persistent atrial fibrillation (HCC)    Mitral stenosis with regurgitation    Myocardial infarction (HCC) 2015   stents x 2   Obesity    Personal history of colonic polyps    Pneumonia 2015 and 06-2020   Pulmonary hypertension (HCC)    S/P Maze operation for atrial fibrillation 07/18/2018   Complete bilateral atrial lesion set using bipolar radiofrequency and cryothermy ablation with clipping of LA appendage   S/P mitral valve replacement with metallic valve 07/18/2018   Sorin Carbomedics Optiform bileaflet mechanical valve,  size 33 mm   S/P TVR (tricuspid valve repair) 07/18/2018   Edwards mc3 ring annuloplasty, size 30 mm   Wears partial dentures    upper and lower    Past Surgical History:  Procedure Laterality Date   APPENDECTOMY     as a child   CARDIOVASCULAR STRESS TEST  03/2014   Borderline reversible ischemic changes at the apex.  Normal LV contractility/EF 52%.   CORONARY STENT PLACEMENT  7/12013   LEFT HEART CATHETERIZATION WITH CORONARY ANGIOGRAM N/A 06/12/2012   Procedure: LEFT HEART CATHETERIZATION WITH CORONARY ANGIOGRAM;  Surgeon: Robynn Pane, MD;  Location: [facility] CATH LAB;  Service: Cardiovascular;  Laterality: N/A;   MAZE N/A 07/18/2018   Procedure: MAZE;  Surgeon: Purcell Nails, MD;  Location: [facility] OR;  Service: Open Heart Surgery;  Laterality: N/A;   MITRAL VALVE REPLACEMENT N/A 07/18/2018   Procedure: MITRAL VALVE (MV) REPLACEMENT WITH CARBOMEDICS OPTIFORM MITRAL VALVE SIZE 33.;  Surgeon: Purcell Nails, MD;  Location: [facility] OR;  Service: Open Heart Surgery;  Laterality: N/A;   PERCUTANEOUS CORONARY STENT INTERVENTION (PCI-S) N/A 06/17/2012   Procedure: PERCUTANEOUS CORONARY STENT INTERVENTION (PCI-S);  Surgeon: Robynn Pane, MD;  Location: [facility] CATH LAB;  Service: Cardiovascular;  Laterality: N/A;   RECTAL EXAM UNDER ANESTHESIA N/A 12/15/2020   Procedure: ANORECTAL EXAM  UNDER ANESTHESIA EXCISION OF ANAL CANAL POLYP;  Surgeon: Andria Meuse, MD;  Location: [facility];  Service: General;  Laterality: N/A;   RIGHT/LEFT HEART CATH AND CORONARY ANGIOGRAPHY N/A 06/10/2018   Procedure: RIGHT/LEFT HEART CATH AND CORONARY ANGIOGRAPHY;  Surgeon: Orpah Cobb, MD;  Location: [facility] INVASIVE CV LAB;  Service: Cardiovascular;  Laterality: N/A;   TEE WITHOUT CARDIOVERSION N/A 06/10/2018   Procedure: TRANSESOPHAGEAL ECHOCARDIOGRAM (TEE) with bubble study;  Surgeon: Orpah Cobb, MD;  Location: [facility] ENDOSCOPY;  Service: Cardiovascular;  Laterality: N/A;   TEE WITHOUT CARDIOVERSION N/A  07/18/2018   Procedure: TRANSESOPHAGEAL ECHOCARDIOGRAM (TEE);  Surgeon: Purcell Nails, MD;  Location: [facility] OR;  Service: Open Heart Surgery;  Laterality: N/A;   TRICUSPID VALVE REPLACEMENT N/A 07/18/2018   Procedure: TRICUSPID VALVE REPAIR WITH EDWARDS MC3 TRICUSPID ANNULOPLASTY RING SIZE 30.;  Surgeon: Purcell Nails, MD;  Location: [facility] OR;  Service: Open Heart Surgery;  Laterality: N/A;    Social History   Socioeconomic History   Marital status: Married    Spouse name: Diane   Number of children: 2   Years of education: 9   Highest education level: GED or equivalent  Occupational History   Occupation: Retired.  Tobacco Use   Smoking status: Former    Current packs/day: 0.00    Average packs/day: 2.0 packs/day for 15.0 years (30.0 ttl pk-yrs)    Types: Cigarettes    Start date: 11/26/1981    Quit date: 11/26/1996    Years since quitting: 26.8   Smokeless tobacco: Never  Vaping Use   Vaping status: Never Used  Substance and Sexual Activity   Alcohol use: No   Drug use: No   Sexual activity: Yes  Other Topics Concern   Not on file  Social History Narrative   Lives with his wife. He has two children. He has a farm and stays busy. He enjoys riding horses and going fishing on his boat.   Social Determinants of Health   Financial Resource Strain: Medium Risk (10/03/2023)   Overall Financial Resource Strain (CARDIA)    Difficulty of Paying Living Expenses: Somewhat hard  Food Insecurity: No Food Insecurity (10/03/2023)   Hunger Vital Sign    Worried About Running Out of Food in the Last Year: Never true    Ran Out of Food in the Last Year: Never true  Transportation Needs: No Transportation Needs (10/03/2023)   PRAPARE - Administrator, Civil Service (Medical): No    Lack of Transportation (Non-Medical): No  Physical Activity: Inactive (10/03/2023)   Exercise Vital Sign    Days of Exercise per Week: 0 days    Minutes of Exercise per Session: 30 min  Stress: No Stress  Concern Present (10/03/2023)   [employer] of Occupational Health - Occupational Stress Questionnaire    Feeling of Stress : Not at all  Social Connections: Socially Integrated (10/03/2023)   Social Connection and Isolation Panel [NHANES]    Frequency of Communication with Friends and Family: More than three times a week    Frequency of Social Gatherings with Friends and Family: Three times a week    Attends Religious Services: 1 to 4 times per year    Active Member of Clubs or Organizations: Yes    Attends Banker Meetings: 1 to 4 times per year    Marital Status: Married    Family History  Problem Relation Age of Onset   Heart disease Mother    Heart  disease Father    Diabetes Father    Diabetes Brother    Breast cancer Maternal Aunt 65   Cancer Paternal Aunt        unk type, tumor on head   Lung cancer Cousin 7   Cancer Cousin 38   Colon cancer Neg Hx    Esophageal cancer Neg Hx    Stomach cancer Neg Hx     Health Maintenance  Topic Date Due   Zoster Vaccines- Shingrix (1 of 2) Never done   COVID-19 Vaccine (4 - 2023-24 season) 10/23/2023 (Originally 07/28/2023)   Medicare Annual Wellness (AWV)  04/01/2024   DTaP/Tdap/Td (3 - Td or Tdap) 04/21/2030   Pneumonia Vaccine 66+ Years old  Completed   INFLUENZA VACCINE  Completed   Hepatitis C Screening  Completed   HPV VACCINES  Aged Out   Fecal DNA (Cologuard)  Discontinued     ----------------------------------------------------------------------------------------------------------------------------------------------------------------------------------------------------------------- Physical Exam BP 119/67 (BP Location: Left Arm, Patient Position: Sitting, Cuff Size: Large)   Pulse 61   Ht 5\' 11"  (1.803 m)   Wt 278 lb (126.1 kg)   SpO2 99%   BMI 38.77 kg/m   Physical Exam Constitutional:      Appearance: Normal appearance.  Eyes:     General: No scleral icterus. Cardiovascular:     Rate and  Rhythm: Normal rate.  Pulmonary:     Effort: Pulmonary effort is normal.     Breath sounds: Normal breath sounds.  Neurological:     Mental Status: He is alert.  Psychiatric:        Mood and Affect: Mood normal.        Behavior: Behavior normal.     ------------------------------------------------------------------------------------------------------------------------------------------------------------------------------------------------------------------- Assessment and Plan  Hyperlipidemia Continue atorvastatin at current strength.   Essential hypertension, benign BP is well controlled at this time.  Recommend continuation of current medications.    Chronic combined systolic and diastolic heart failure (HCC) Remains on entresto and torsemide.  Euvolemic at this time, denies symptoms.    Longstanding persistent atrial fibrillation (HCC) Continues to see cardiology.  Stable at this time.  Continue metoprolol for rate control. Coumadin monitoring done by cardiology.    Prediabetes Doing well.  Blood sugars have remained well controlled.    No orders of the defined types were placed in this encounter.   Return in about 6 months (around 04/05/2024) for Annual Exam/fasting labs.    This visit occurred during the SARS-CoV-2 public health emergency.  Safety protocols were in place, including screening questions prior to the visit, additional usage of staff PPE, and extensive cleaning of exam room while observing appropriate contact time as indicated for disinfecting solutions.

## 2023-10-07 NOTE — Progress Notes (Signed)
Pt plans to have Shingles vaccine at pharmacy.

## 2023-10-07 NOTE — Assessment & Plan Note (Signed)
Remains on entresto and torsemide.  Euvolemic at this time, denies symptoms.

## 2023-10-07 NOTE — Assessment & Plan Note (Signed)
Continue atorvastatin at current strength.  

## 2023-10-07 NOTE — Assessment & Plan Note (Signed)
Continues to see cardiology.  Stable at this time.  Continue metoprolol for rate control. Coumadin monitoring done by cardiology.

## 2023-10-21 ENCOUNTER — Ambulatory Visit: Payer: HMO | Attending: Cardiology | Admitting: *Deleted

## 2023-10-21 ENCOUNTER — Other Ambulatory Visit: Payer: Self-pay | Admitting: Family Medicine

## 2023-10-21 DIAGNOSIS — I4891 Unspecified atrial fibrillation: Secondary | ICD-10-CM

## 2023-10-21 DIAGNOSIS — Z954 Presence of other heart-valve replacement: Secondary | ICD-10-CM

## 2023-10-21 DIAGNOSIS — Z5181 Encounter for therapeutic drug level monitoring: Secondary | ICD-10-CM

## 2023-10-21 DIAGNOSIS — I5042 Chronic combined systolic (congestive) and diastolic (congestive) heart failure: Secondary | ICD-10-CM

## 2023-10-21 LAB — POCT INR: INR: 2.7 (ref 2.0–3.0)

## 2023-10-21 NOTE — Patient Instructions (Signed)
Continue warfarin 1 tablet daily. Recheck in 7 wks

## 2023-11-19 ENCOUNTER — Other Ambulatory Visit: Payer: Self-pay | Admitting: Family Medicine

## 2023-11-19 DIAGNOSIS — I5042 Chronic combined systolic (congestive) and diastolic (congestive) heart failure: Secondary | ICD-10-CM

## 2023-12-07 ENCOUNTER — Other Ambulatory Visit: Payer: Self-pay | Admitting: Cardiology

## 2023-12-07 DIAGNOSIS — E782 Mixed hyperlipidemia: Secondary | ICD-10-CM

## 2023-12-09 ENCOUNTER — Ambulatory Visit: Payer: HMO | Attending: Cardiology | Admitting: *Deleted

## 2023-12-09 DIAGNOSIS — I4891 Unspecified atrial fibrillation: Secondary | ICD-10-CM | POA: Diagnosis not present

## 2023-12-09 DIAGNOSIS — Z954 Presence of other heart-valve replacement: Secondary | ICD-10-CM

## 2023-12-09 DIAGNOSIS — Z5181 Encounter for therapeutic drug level monitoring: Secondary | ICD-10-CM | POA: Diagnosis not present

## 2023-12-09 LAB — POCT INR: INR: 2.2 (ref 2.0–3.0)

## 2023-12-09 NOTE — Patient Instructions (Signed)
Take warfarin 1 1/2 tablets tonight then resume 1 tablet daily Recheck in 6 wks

## 2023-12-19 ENCOUNTER — Ambulatory Visit: Payer: HMO | Attending: Cardiology | Admitting: Cardiology

## 2023-12-19 ENCOUNTER — Encounter: Payer: Self-pay | Admitting: Cardiology

## 2023-12-19 VITALS — BP 120/64 | HR 82 | Ht 71.0 in | Wt 282.0 lb

## 2023-12-19 DIAGNOSIS — Z954 Presence of other heart-valve replacement: Secondary | ICD-10-CM | POA: Diagnosis not present

## 2023-12-19 DIAGNOSIS — I5032 Chronic diastolic (congestive) heart failure: Secondary | ICD-10-CM | POA: Diagnosis not present

## 2023-12-19 DIAGNOSIS — I4891 Unspecified atrial fibrillation: Secondary | ICD-10-CM

## 2023-12-19 DIAGNOSIS — I2510 Atherosclerotic heart disease of native coronary artery without angina pectoris: Secondary | ICD-10-CM

## 2023-12-19 DIAGNOSIS — E782 Mixed hyperlipidemia: Secondary | ICD-10-CM | POA: Diagnosis not present

## 2023-12-19 NOTE — Progress Notes (Signed)
Clinical Summary [name] is a 68 y.o.male seen today for follow up of the following medical problems.      1. History of MVR for rheumatic mitral stenosis - On 07/18/2018 he underwent mitral valve replacement with mechanical mitral valve, tricuspid valve repair using an Edwards mc3 ring annuloplasty , and Maze procedure -Sorin Carbomedics Optiform bileaflet 33 mm mechanical valve on 07/18/2018.    - Jan 2020 echo LVEF 55-60%, normal MVR     -no recent SOB/DOE, no LE edema - compliant with meds, no bleeding on coumadin.        2. Permanent afib - denies any palpitations - on coumadin in setting of afib and mechanical valve, ASA 81mg  daily as well    3. CAD - MI in 2013 with stenting   - He underwent cardiac catheterization on 06/10/2018.  He had nonobstructive disease with a mid RCA 50% stenosis and otherwise 20% stenosis seen in the proximal left circumflex and ostial to proximal LAD.   - no recent chest pains.    4. Chronic diastolic HF - taking torsemide 20mg  daily, denies any LE edema  - had some prior LV dysfunction, 05/2018 TEE LVEF 35-40% - Jan 2020 echo 55-60%   - no SOB/DOE, no LE edema - taking torsemide 20mg  once daily.    5. COPD       6. Pulmonary HTN - has been on sildenafil per CHF clinic   7. HTN - compliant with meds   8. Hyperlipidemia - LDL has been at goal, was 61 in 05/2020  - lbas followed by pcp, he is on atorvastatin 40mg  daily   05/2021 TC 113 TG 153 HDL 29 LDL 60 03/2023 TC 146 HDL 31 TG 313 LDL 77 - upcoming labs with pcp Past Medical History:  Diagnosis Date   Atrial fibrillation (HCC)    hx of   Cancer (HCC)    skin cancer removed from arm dec 2021 melanoma   Chronic combined systolic and diastolic congestive heart failure (HCC)    COPD with chronic bronchitis (HCC)    Coronary artery disease    COVID-19 virus infection 06/2020   all symptoms resolved   Family history of breast cancer    Family history of breast  cancer    Family history of lung cancer    History of kidney stones    Hyperlipidemia    Hypertension    Longstanding persistent atrial fibrillation (HCC)    Mitral stenosis with regurgitation    Myocardial infarction (HCC) 2015   stents x 2   Obesity    Personal history of colonic polyps    Pneumonia 2015 and 06-2020   Pulmonary hypertension (HCC)    S/P Maze operation for atrial fibrillation 07/18/2018   Complete bilateral atrial lesion set using bipolar radiofrequency and cryothermy ablation with clipping of LA appendage   S/P mitral valve replacement with metallic valve 07/18/2018   Sorin Carbomedics Optiform bileaflet mechanical valve, size 33 mm   S/P TVR (tricuspid valve repair) 07/18/2018   Edwards mc3 ring annuloplasty, size 30 mm   Wears partial dentures    upper and lower     Allergies  Allergen Reactions   Ramipril Cough    cough   Percocet [Oxycodone-Acetaminophen]     In ICU it contributed to hallucinations. Can take by itself     Current Outpatient Medications  Medication Sig Dispense Refill   acetaminophen (TYLENOL) 500 MG tablet Take 1,000 mg by mouth  2 (two) times daily.     alclomethasone (ACLOVATE) 0.05 % cream Apply topically 2 (two) times daily as needed.     aspirin 81 MG tablet Take 81 mg by mouth daily.     atorvastatin (LIPITOR) 80 MG tablet Take 1 tablet by mouth in the evening 90 tablet 0   clindamycin (CLINDAGEL) 1 % gel Apply topically.     metoprolol succinate (TOPROL-XL) 25 MG 24 hr tablet Take 1 tablet by mouth once daily 90 tablet 0   sacubitril-valsartan (ENTRESTO) 24-26 MG Take 1 tablet by mouth 2 (two) times daily.     sildenafil (REVATIO) 20 MG tablet TAKE 1 TABLET BY MOUTH THREE TIMES DAILY 270 tablet 3   torsemide (DEMADEX) 20 MG tablet Take 2 tablets by mouth once daily 60 tablet 1   warfarin (COUMADIN) 4 MG tablet TAKE 1 TABLET BY MOUTH ONCE DAILY OR  AS  DIRECTED  BY  COUMADIN  CLINIC. 90 tablet 3   No current facility-administered  medications for this visit.     Past Surgical History:  Procedure Laterality Date   APPENDECTOMY     as a child   CARDIOVASCULAR STRESS TEST  03/2014   Borderline reversible ischemic changes at the apex.  Normal LV contractility/EF 52%.   CORONARY STENT PLACEMENT  7/12013   LEFT HEART CATHETERIZATION WITH CORONARY ANGIOGRAM N/A 06/12/2012   Procedure: LEFT HEART CATHETERIZATION WITH CORONARY ANGIOGRAM;  Surgeon: Robynn Pane, MD;  Location: [facility] CATH LAB;  Service: Cardiovascular;  Laterality: N/A;   MAZE N/A 07/18/2018   Procedure: MAZE;  Surgeon: Purcell Nails, MD;  Location: [facility] OR;  Service: Open Heart Surgery;  Laterality: N/A;   MITRAL VALVE REPLACEMENT N/A 07/18/2018   Procedure: MITRAL VALVE (MV) REPLACEMENT WITH CARBOMEDICS OPTIFORM MITRAL VALVE SIZE 33.;  Surgeon: Purcell Nails, MD;  Location: [facility] OR;  Service: Open Heart Surgery;  Laterality: N/A;   PERCUTANEOUS CORONARY STENT INTERVENTION (PCI-S) N/A 06/17/2012   Procedure: PERCUTANEOUS CORONARY STENT INTERVENTION (PCI-S);  Surgeon: Robynn Pane, MD;  Location: [facility] CATH LAB;  Service: Cardiovascular;  Laterality: N/A;   RECTAL EXAM UNDER ANESTHESIA N/A 12/15/2020   Procedure: ANORECTAL EXAM UNDER ANESTHESIA EXCISION OF ANAL CANAL POLYP;  Surgeon: Andria Meuse, MD;  Location: [facility];  Service: General;  Laterality: N/A;   RIGHT/LEFT HEART CATH AND CORONARY ANGIOGRAPHY N/A 06/10/2018   Procedure: RIGHT/LEFT HEART CATH AND CORONARY ANGIOGRAPHY;  Surgeon: Orpah Cobb, MD;  Location: [facility] INVASIVE CV LAB;  Service: Cardiovascular;  Laterality: N/A;   TEE WITHOUT CARDIOVERSION N/A 06/10/2018   Procedure: TRANSESOPHAGEAL ECHOCARDIOGRAM (TEE) with bubble study;  Surgeon: Orpah Cobb, MD;  Location: [facility] ENDOSCOPY;  Service: Cardiovascular;  Laterality: N/A;   TEE WITHOUT CARDIOVERSION N/A 07/18/2018   Procedure: TRANSESOPHAGEAL ECHOCARDIOGRAM (TEE);  Surgeon: Purcell Nails, MD;  Location: [facility] OR;  Service:  Open Heart Surgery;  Laterality: N/A;   TRICUSPID VALVE REPLACEMENT N/A 07/18/2018   Procedure: TRICUSPID VALVE REPAIR WITH EDWARDS MC3 TRICUSPID ANNULOPLASTY RING SIZE 30.;  Surgeon: Purcell Nails, MD;  Location: [facility] OR;  Service: Open Heart Surgery;  Laterality: N/A;     Allergies  Allergen Reactions   Ramipril Cough    cough   Percocet [Oxycodone-Acetaminophen]     In ICU it contributed to hallucinations. Can take by itself      Family History  Problem Relation Age of Onset   Heart disease Mother    Heart disease Father    Diabetes Father  Diabetes Brother    Breast cancer Maternal Aunt 65   Cancer Paternal Aunt        unk type, tumor on head   Lung cancer Cousin 88   Cancer Cousin 38   Colon cancer Neg Hx    Esophageal cancer Neg Hx    Stomach cancer Neg Hx      Social History [name] reports that he quit smoking about 27 years ago. His smoking use included cigarettes. He started smoking about 42 years ago. He has a 30 pack-year smoking history. He has never used smokeless tobacco. [name] reports no history of alcohol use.     Physical Examination Today's Vitals   12/19/23 0817  BP: 120/64  Pulse: 82  SpO2: 100%  Weight: 282 lb (127.9 kg)  Height: 5\' 11"  (1.803 m)   Body mass index is 39.33 kg/m.  Gen: resting comfortably, no acute distress HEENT: no scleral icterus, pupils equal round and reactive, no palptable cervical adenopathy,  CV: irregular, mechanical S1 Resp: Clear to auscultation bilaterally GI: abdomen is soft, non-tender, non-distended, normal bowel sounds, no hepatosplenomegaly MSK: extremities are warm, no edema.  Skin: warm, no rash Neuro:  no focal deficits Psych: appropriate affect     Assessment and Plan  1. History of MV replacment/mechnical MV - no symptoms, normal functioning MVR by last echo - contniue coumadin and ASA   2. Afib/acquired thrombophilia - on coumadin in setting of afib and mechanical MV - no  symptoms, continue current meds  3. Hyperlipidemia - upcoming labs with pcp, continue current meds    4. CAD - no symptoms, continue current meds   5. Chronic diastolic - euvolemic without symptoms, continue current therapy.    F/u 6 months   Antoine Poche, M.D.

## 2023-12-19 NOTE — Patient Instructions (Signed)
Medication Instructions:  Continue all current medications.   Labwork: none  Testing/Procedures: none  Follow-Up: 6 months   Any Other Special Instructions Will Be Listed Below (If Applicable).   If you need a refill on your cardiac medications before your next appointment, please call your pharmacy.  

## 2023-12-28 ENCOUNTER — Other Ambulatory Visit: Payer: Self-pay | Admitting: Cardiology

## 2024-01-15 DIAGNOSIS — L218 Other seborrheic dermatitis: Secondary | ICD-10-CM | POA: Diagnosis not present

## 2024-01-15 DIAGNOSIS — L821 Other seborrheic keratosis: Secondary | ICD-10-CM | POA: Diagnosis not present

## 2024-01-20 ENCOUNTER — Ambulatory Visit: Payer: HMO | Attending: Cardiology | Admitting: *Deleted

## 2024-01-20 DIAGNOSIS — Z954 Presence of other heart-valve replacement: Secondary | ICD-10-CM | POA: Diagnosis not present

## 2024-01-20 DIAGNOSIS — Z5181 Encounter for therapeutic drug level monitoring: Secondary | ICD-10-CM | POA: Diagnosis not present

## 2024-01-20 DIAGNOSIS — I4891 Unspecified atrial fibrillation: Secondary | ICD-10-CM | POA: Diagnosis not present

## 2024-01-20 LAB — POCT INR: INR: 2 (ref 2.0–3.0)

## 2024-01-20 NOTE — Patient Instructions (Signed)
 Increase warfarin to 1 tablet daily except 1 1/2 tablets on Mondays and Thursdays Recheck in 3 wks

## 2024-01-23 ENCOUNTER — Other Ambulatory Visit: Payer: Self-pay | Admitting: Family Medicine

## 2024-01-23 DIAGNOSIS — I5042 Chronic combined systolic (congestive) and diastolic (congestive) heart failure: Secondary | ICD-10-CM

## 2024-01-27 NOTE — Telephone Encounter (Signed)
 Please contact the patient to reschedule AWV appt. Pt has 2 appts on the same day. Thanks

## 2024-01-27 NOTE — Telephone Encounter (Signed)
 Called patient to reschedule AWV, no answer LVM.

## 2024-02-10 ENCOUNTER — Ambulatory Visit: Payer: HMO | Attending: Cardiology | Admitting: *Deleted

## 2024-02-10 DIAGNOSIS — I4891 Unspecified atrial fibrillation: Secondary | ICD-10-CM

## 2024-02-10 DIAGNOSIS — Z5181 Encounter for therapeutic drug level monitoring: Secondary | ICD-10-CM | POA: Diagnosis not present

## 2024-02-10 DIAGNOSIS — Z954 Presence of other heart-valve replacement: Secondary | ICD-10-CM | POA: Diagnosis not present

## 2024-02-10 LAB — POCT INR: INR: 3.4 — AB (ref 2.0–3.0)

## 2024-02-10 NOTE — Patient Instructions (Signed)
Continue warfarin 1 tablet daily except 1 1/2 tablets on Mondays and Thursdays. Recheck in 4 wks 

## 2024-03-09 ENCOUNTER — Encounter

## 2024-03-10 ENCOUNTER — Other Ambulatory Visit: Payer: Self-pay | Admitting: Cardiology

## 2024-03-10 DIAGNOSIS — E782 Mixed hyperlipidemia: Secondary | ICD-10-CM

## 2024-03-12 ENCOUNTER — Ambulatory Visit: Attending: Cardiology | Admitting: *Deleted

## 2024-03-12 DIAGNOSIS — I4891 Unspecified atrial fibrillation: Secondary | ICD-10-CM | POA: Diagnosis not present

## 2024-03-12 DIAGNOSIS — Z5181 Encounter for therapeutic drug level monitoring: Secondary | ICD-10-CM

## 2024-03-12 DIAGNOSIS — Z954 Presence of other heart-valve replacement: Secondary | ICD-10-CM

## 2024-03-12 LAB — POCT INR: INR: 4 — AB (ref 2.0–3.0)

## 2024-03-12 NOTE — Patient Instructions (Signed)
 Hold warfarin tonight then 1 tablet daily except 1 1/2 tablets on Mondays and Thursdays Recheck in 4 wks

## 2024-03-25 ENCOUNTER — Other Ambulatory Visit: Payer: Self-pay | Admitting: Family Medicine

## 2024-03-25 DIAGNOSIS — I5042 Chronic combined systolic (congestive) and diastolic (congestive) heart failure: Secondary | ICD-10-CM

## 2024-03-27 ENCOUNTER — Telehealth: Payer: Self-pay | Admitting: Cardiology

## 2024-03-27 MED ORDER — ENTRESTO 24-26 MG PO TABS: 1.0000 | ORAL_TABLET | Freq: Two times a day (BID) | ORAL | 0 refills | Status: DC

## 2024-03-27 NOTE — Telephone Encounter (Signed)
 Patient calling the office for samples of medication:   1.  What medication and dosage are you requesting samples for?  Entresto  24-26mg    2.  Are you currently out of this medication?    Not yet but will be in a week.

## 2024-03-27 NOTE — Telephone Encounter (Signed)
 Pt came into [place] office and expressed he needed patient assistance papers filled out. I have [employer] forms that he filled out. IS there a number to fax them to?

## 2024-03-27 NOTE — Telephone Encounter (Signed)
Patient notified samples ready for pick up

## 2024-03-29 ENCOUNTER — Other Ambulatory Visit: Payer: Self-pay | Admitting: Cardiology

## 2024-03-30 ENCOUNTER — Telehealth: Payer: Self-pay

## 2024-03-30 NOTE — Telephone Encounter (Signed)
 It's on the [facility] PAP fax. I can index to chart.    It should populate in media shortly.

## 2024-03-30 NOTE — Telephone Encounter (Signed)
 Patient forms filled out, signed and dated and sent back to the provided fax #

## 2024-03-30 NOTE — Telephone Encounter (Signed)
 Having front office staff scan in the signed assistance form

## 2024-03-30 NOTE — Telephone Encounter (Signed)
 Received a fax notice in the [facility] Patient Assistance [employer] que with [employer] paperwork for patient. Missing provider signature and date. Please have provider sign and date and fax completed forms back to [phone].

## 2024-03-30 NOTE — Telephone Encounter (Signed)
 Received paperwork on fax, reviewing

## 2024-03-31 ENCOUNTER — Ambulatory Visit: Attending: Cardiology | Admitting: *Deleted

## 2024-03-31 ENCOUNTER — Other Ambulatory Visit (HOSPITAL_COMMUNITY): Payer: Self-pay

## 2024-03-31 DIAGNOSIS — Z954 Presence of other heart-valve replacement: Secondary | ICD-10-CM | POA: Diagnosis not present

## 2024-03-31 DIAGNOSIS — Z5181 Encounter for therapeutic drug level monitoring: Secondary | ICD-10-CM | POA: Diagnosis not present

## 2024-03-31 DIAGNOSIS — I4891 Unspecified atrial fibrillation: Secondary | ICD-10-CM | POA: Diagnosis not present

## 2024-03-31 LAB — POCT INR: INR: 3.3 — AB (ref 2.0–3.0)

## 2024-03-31 NOTE — Patient Instructions (Signed)
Continue warfarin 1 tablet daily except 1 1/2 tablets on Mondays and Thursdays. Recheck in 4 wks 

## 2024-04-01 NOTE — Telephone Encounter (Signed)
   I just indexed this for you

## 2024-04-02 NOTE — Telephone Encounter (Signed)
 PAP: Patient assistance application for Entresto  has been approved by PAP Companies: [employer] from 04/01/24 to 11/25/24. Medication should be delivered to PAP Delivery: Home. For further shipping updates, please contact [employer] at [phone]. Patient ID is: [id number]

## 2024-04-03 ENCOUNTER — Ambulatory Visit (INDEPENDENT_AMBULATORY_CARE_PROVIDER_SITE_OTHER): Admitting: Family Medicine

## 2024-04-03 ENCOUNTER — Telehealth: Payer: Self-pay

## 2024-04-03 ENCOUNTER — Encounter: Payer: Self-pay | Admitting: Family Medicine

## 2024-04-03 VITALS — BP 124/67 | HR 70 | Ht 71.0 in | Wt 276.0 lb

## 2024-04-03 DIAGNOSIS — I252 Old myocardial infarction: Secondary | ICD-10-CM | POA: Diagnosis not present

## 2024-04-03 DIAGNOSIS — I10 Essential (primary) hypertension: Secondary | ICD-10-CM

## 2024-04-03 DIAGNOSIS — R7303 Prediabetes: Secondary | ICD-10-CM

## 2024-04-03 DIAGNOSIS — Z125 Encounter for screening for malignant neoplasm of prostate: Secondary | ICD-10-CM

## 2024-04-03 DIAGNOSIS — Z0000 Encounter for general adult medical examination without abnormal findings: Secondary | ICD-10-CM | POA: Diagnosis not present

## 2024-04-03 DIAGNOSIS — I2510 Atherosclerotic heart disease of native coronary artery without angina pectoris: Secondary | ICD-10-CM

## 2024-04-03 MED ORDER — SEMAGLUTIDE-WEIGHT MANAGEMENT 0.25 MG/0.5ML ~~LOC~~ SOAJ: 0.2500 mg | SUBCUTANEOUS | 0 refills | Status: AC

## 2024-04-03 MED ORDER — SEMAGLUTIDE-WEIGHT MANAGEMENT 1 MG/0.5ML ~~LOC~~ SOAJ: 1.0000 mg | SUBCUTANEOUS | 0 refills | Status: AC

## 2024-04-03 MED ORDER — SEMAGLUTIDE-WEIGHT MANAGEMENT 0.5 MG/0.5ML ~~LOC~~ SOAJ: 0.5000 mg | SUBCUTANEOUS | 0 refills | Status: AC

## 2024-04-03 NOTE — Telephone Encounter (Signed)
 Pharmacy Patient Advocate Encounter   Received notification from CoverMyMeds that prior authorization for Wegovy  0.25MG /0.5ML auto-injectors is required/requested.   Insurance verification completed.   The patient is insured through [facility] ADVANTAGE/RX ADVANCE .   Per test claim: PA required; PA submitted to above mentioned insurance via CoverMyMeds Key/confirmation #/EOC [id number] Status is pending

## 2024-04-03 NOTE — Patient Instructions (Signed)
 Preventive Care 73 Years and Older, Male Preventive care refers to lifestyle choices and visits with your health care provider that can promote health and wellness. Preventive care visits are also called wellness exams. What can I expect for my preventive care visit? Counseling During your preventive care visit, your health care provider may ask about your: Medical history, including: Past medical problems. Family medical history. History of falls. Current health, including: Emotional well-being. Home life and relationship well-being. Sexual activity. Memory and ability to understand (cognition). Lifestyle, including: Alcohol, nicotine or tobacco, and drug use. Access to firearms. Diet, exercise, and sleep habits. Work and work Astronomer. Sunscreen use. Safety issues such as seatbelt and bike helmet use. Physical exam Your health care provider will check your: Height and weight. These may be used to calculate your BMI (body mass index). BMI is a measurement that tells if you are at a healthy weight. Waist circumference. This measures the distance around your waistline. This measurement also tells if you are at a healthy weight and may help predict your risk of certain diseases, such as type 2 diabetes and high blood pressure. Heart rate and blood pressure. Body temperature. Skin for abnormal spots. What immunizations do I need?  Vaccines are usually given at various ages, according to a schedule. Your health care provider will recommend vaccines for you based on your age, medical history, and lifestyle or other factors, such as travel or where you work. What tests do I need? Screening Your health care provider may recommend screening tests for certain conditions. This may include: Lipid and cholesterol levels. Diabetes screening. This is done by checking your blood sugar (glucose) after you have not eaten for a while (fasting). Hepatitis C test. Hepatitis B test. HIV (human  immunodeficiency virus) test. STI (sexually transmitted infection) testing, if you are at risk. Lung cancer screening. Colorectal cancer screening. Prostate cancer screening. Abdominal aortic aneurysm (AAA) screening. You may need this if you are a current or former smoker. Talk with your health care provider about your test results, treatment options, and if necessary, the need for more tests. Follow these instructions at home: Eating and drinking  Eat a diet that includes fresh fruits and vegetables, whole grains, lean protein, and low-fat dairy products. Limit your intake of foods with high amounts of sugar, saturated fats, and salt. Take vitamin and mineral supplements as recommended by your health care provider. Do not drink alcohol if your health care provider tells you not to drink. If you drink alcohol: Limit how much you have to 0-2 drinks a day. Know how much alcohol is in your drink. In the [place] one drink equals one 12 oz bottle of beer (355 mL), one 5 oz glass of wine (148 mL), or one 1 oz glass of hard liquor (44 mL). Lifestyle Brush your teeth every morning and night with fluoride toothpaste. Floss one time each day. Exercise for at least 30 minutes 5 or more days each week. Do not use any products that contain nicotine or tobacco. These products include cigarettes, chewing tobacco, and vaping devices, such as e-cigarettes. If you need help quitting, ask your health care provider. Do not use drugs. If you are sexually active, practice safe sex. Use a condom or other form of protection to prevent STIs. Take aspirin only as told by your health care provider. Make sure that you understand how much to take and what form to take. Work with your health care provider to find out whether it is safe  and beneficial for you to take aspirin daily. Ask your health care provider if you need to take a cholesterol-lowering medicine (statin). Find healthy ways to manage stress, such  as: Meditation, yoga, or listening to music. Journaling. Talking to a trusted person. Spending time with friends and family. Safety Always wear your seat belt while driving or riding in a vehicle. Do not drive: If you have been drinking alcohol. Do not ride with someone who has been drinking. When you are tired or distracted. While texting. If you have been using any mind-altering substances or drugs. Wear a helmet and other protective equipment during sports activities. If you have firearms in your house, make sure you follow all gun safety procedures. Minimize exposure to UV radiation to reduce your risk of skin cancer. What's next? Visit your health care provider once a year for an annual wellness visit. Ask your health care provider how often you should have your eyes and teeth checked. Stay up to date on all vaccines. This information is not intended to replace advice given to you by your health care provider. Make sure you discuss any questions you have with your health care provider. Document Revised: 05/10/2021 Document Reviewed: 05/10/2021 Elsevier Patient Education  2024 [employer].

## 2024-04-03 NOTE — Assessment & Plan Note (Signed)
 Adding Wegovy  for secondary prevention of cardiovascular disease as patient has had MI previously.

## 2024-04-03 NOTE — Progress Notes (Signed)
 Jeffrey Larson - 68 y.o. male MRN [id number]  Date of birth: 1956/09/22  Subjective Chief Complaint  Patient presents with   Annual Exam    HPI Jeffrey Larson is a 68 y.o. male here today for annual exam.   Reports that he is doing well.  Continues to see cardiology regularly.  Stable with current medications.    He is somewhat active.  He is working on diet.    He is a non-smoker.  No EtOH at this time.   Review of Systems  Constitutional:  Negative for chills, fever, malaise/fatigue and weight loss.  HENT:  Negative for congestion, ear pain and sore throat.   Eyes:  Negative for blurred vision, double vision and pain.  Respiratory:  Negative for cough and shortness of breath.   Cardiovascular:  Negative for chest pain and palpitations.  Gastrointestinal:  Negative for abdominal pain, blood in stool, constipation, heartburn and nausea.  Genitourinary:  Negative for dysuria and urgency.  Musculoskeletal:  Negative for joint pain and myalgias.  Neurological:  Negative for dizziness and headaches.  Endo/Heme/Allergies:  Does not bruise/bleed easily.  Psychiatric/Behavioral:  Negative for depression. The patient is not nervous/anxious and does not have insomnia.     Allergies  Allergen Reactions   Ramipril  Cough    cough   Percocet [Oxycodone -Acetaminophen ]     In ICU it contributed to hallucinations. Can take by itself    Past Medical History:  Diagnosis Date   Atrial fibrillation (HCC)    hx of   Cancer (HCC)    skin cancer removed from arm dec 2021 melanoma   Chronic combined systolic and diastolic congestive heart failure (HCC)    COPD with chronic bronchitis (HCC)    Coronary artery disease    COVID-19 virus infection 06/2020   all symptoms resolved   Family history of breast cancer    Family history of breast cancer    Family history of lung cancer    History of kidney stones    Hyperlipidemia    Hypertension    Longstanding persistent atrial  fibrillation (HCC)    Mitral stenosis with regurgitation    Myocardial infarction (HCC) 2015   stents x 2   Obesity    Personal history of colonic polyps    Pneumonia 2015 and 06-2020   Pulmonary hypertension (HCC)    S/P Maze operation for atrial fibrillation 07/18/2018   Complete bilateral atrial lesion set using bipolar radiofrequency and cryothermy ablation with clipping of LA appendage   S/P mitral valve replacement with metallic valve 07/18/2018   Sorin Carbomedics Optiform bileaflet mechanical valve, size 33 mm   S/P TVR (tricuspid valve repair) 07/18/2018   Edwards mc3 ring annuloplasty, size 30 mm   Wears partial dentures    upper and lower    Past Surgical History:  Procedure Laterality Date   APPENDECTOMY     as a child   CARDIOVASCULAR STRESS TEST  03/2014   Borderline reversible ischemic changes at the apex.  Normal LV contractility/EF 52%.   CORONARY STENT PLACEMENT  7/12013   LEFT HEART CATHETERIZATION WITH CORONARY ANGIOGRAM N/A 06/12/2012   Procedure: LEFT HEART CATHETERIZATION WITH CORONARY ANGIOGRAM;  Surgeon: Sharene Dauer, MD;  Location: [facility] CATH LAB;  Service: Cardiovascular;  Laterality: N/A;   MAZE N/A 07/18/2018   Procedure: MAZE;  Surgeon: Gardenia Jump, MD;  Location: [facility] OR;  Service: Open Heart Surgery;  Laterality: N/A;   MITRAL VALVE REPLACEMENT N/A 07/18/2018  Procedure: MITRAL VALVE (MV) REPLACEMENT WITH CARBOMEDICS OPTIFORM MITRAL VALVE SIZE 33.;  Surgeon: Gardenia Jump, MD;  Location: [facility] OR;  Service: Open Heart Surgery;  Laterality: N/A;   PERCUTANEOUS CORONARY STENT INTERVENTION (PCI-S) N/A 06/17/2012   Procedure: PERCUTANEOUS CORONARY STENT INTERVENTION (PCI-S);  Surgeon: Sharene Dauer, MD;  Location: [facility] CATH LAB;  Service: Cardiovascular;  Laterality: N/A;   RECTAL EXAM UNDER ANESTHESIA N/A 12/15/2020   Procedure: ANORECTAL EXAM UNDER ANESTHESIA EXCISION OF ANAL CANAL POLYP;  Surgeon: Melvenia Stabs, MD;  [facility];  Service: General;  Laterality: N/A;   RIGHT/LEFT HEART CATH AND CORONARY ANGIOGRAPHY N/A 06/10/2018   Procedure: RIGHT/LEFT HEART CATH AND CORONARY ANGIOGRAPHY;  Surgeon: Pasqual Bone, MD;  Location: [facility] INVASIVE CV LAB;  Service: Cardiovascular;  Laterality: N/A;   TEE WITHOUT CARDIOVERSION N/A 06/10/2018   Procedure: TRANSESOPHAGEAL ECHOCARDIOGRAM (TEE) with bubble study;  Surgeon: Pasqual Bone, MD;  Location: [facility] ENDOSCOPY;  Service: Cardiovascular;  Laterality: N/A;   TEE WITHOUT CARDIOVERSION N/A 07/18/2018   Procedure: TRANSESOPHAGEAL ECHOCARDIOGRAM (TEE);  Surgeon: Gardenia Jump, MD;  Location: [facility] OR;  Service: Open Heart Surgery;  Laterality: N/A;   TRICUSPID VALVE REPLACEMENT N/A 07/18/2018   Procedure: TRICUSPID VALVE REPAIR WITH EDWARDS MC3 TRICUSPID ANNULOPLASTY RING SIZE 30.;  Surgeon: Gardenia Jump, MD;  Location: [facility] OR;  Service: Open Heart Surgery;  Laterality: N/A;    Social History   Socioeconomic History   Marital status: Married    Spouse name: Diane   Number of children: 2   Years of education: 9   Highest education level: GED or equivalent  Occupational History   Occupation: Retired.  Tobacco Use   Smoking status: Former    Current packs/day: 0.00    Average packs/day: 2.0 packs/day for 15.0 years (30.0 ttl pk-yrs)    Types: Cigarettes    Start date: 11/26/1981    Quit date: 11/26/1996    Years since quitting: 27.3   Smokeless tobacco: Never  Vaping Use   Vaping status: Never Used  Substance and Sexual Activity   Alcohol use: No   Drug use: No   Sexual activity: Yes  Other Topics Concern   Not on file  Social History Narrative   Lives with his wife. He has two children. He has a farm and stays busy. He enjoys riding horses and going fishing on his boat.   Social Drivers of Corporate investment banker Strain: Low Risk  (03/30/2024)   Overall Financial Resource Strain (CARDIA)    Difficulty of Paying Living Expenses: Not very hard  Food  Insecurity: No Food Insecurity (03/30/2024)   Hunger Vital Sign    Worried About Running Out of Food in the Last Year: Never true    Ran Out of Food in the Last Year: Never true  Transportation Needs: No Transportation Needs (03/30/2024)   PRAPARE - Administrator, Civil Service (Medical): No    Lack of Transportation (Non-Medical): No  Physical Activity: Sufficiently Active (03/30/2024)   Exercise Vital Sign    Days of Exercise per Week: 5 days    Minutes of Exercise per Session: 30 min  Stress: No Stress Concern Present (03/30/2024)   [employer] of Occupational Health - Occupational Stress Questionnaire    Feeling of Stress : Not at all  Social Connections: Socially Integrated (03/30/2024)   Social Connection and Isolation Panel [NHANES]    Frequency of Communication with Friends and Family: More than three  times a week    Frequency of Social Gatherings with Friends and Family: More than three times a week    Attends Religious Services: More than 4 times per year    Active Member of Clubs or Organizations: Yes    Attends Engineer, structural: More than 4 times per year    Marital Status: Married    Family History  Problem Relation Age of Onset   Heart disease Mother    Heart disease Father    Diabetes Father    Diabetes Brother    Breast cancer Maternal Aunt 10   Cancer Paternal Aunt        unk type, tumor on head   Lung cancer Cousin 80   Cancer Cousin 38   Colon cancer Neg Hx    Esophageal cancer Neg Hx    Stomach cancer Neg Hx     Health Maintenance  Topic Date Due   COVID-19 Vaccine (4 - 2024-25 season) 07/28/2023   Zoster Vaccines- Shingrix (2 of 2) 12/23/2023   Medicare Annual Wellness (AWV)  04/01/2024   INFLUENZA VACCINE  06/26/2024   DTaP/Tdap/Td (3 - Td or Tdap) 04/21/2030   Pneumonia Vaccine 40+ Years old  Completed   Hepatitis C Screening  Completed   HPV VACCINES  Aged Out   Meningococcal B Vaccine  Aged Out   Fecal DNA (Cologuard)   Discontinued     ----------------------------------------------------------------------------------------------------------------------------------------------------------------------------------------------------------------- Physical Exam BP 124/67 (BP Location: Left Arm, Patient Position: Sitting, Cuff Size: Large)   Pulse 70   Ht 5\' 11"  (1.803 m)   Wt 276 lb (125.2 kg)   SpO2 96%   BMI 38.49 kg/m   Physical Exam Constitutional:      Appearance: Normal appearance.  Eyes:     General: No scleral icterus. Cardiovascular:     Rate and Rhythm: Normal rate and regular rhythm.  Pulmonary:     Effort: Pulmonary effort is normal.     Breath sounds: Normal breath sounds.  Neurological:     Mental Status: He is alert.  Psychiatric:        Mood and Affect: Mood normal.        Behavior: Behavior normal.     ------------------------------------------------------------------------------------------------------------------------------------------------------------------------------------------------------------------- Assessment and Plan  Coronary artery disease Adding Wegovy  for secondary prevention of cardiovascular disease as patient has had MI previously.     Well adult exam Well adult Orders Placed This Encounter  Procedures   HgB A1c   PSA   TSH   Lipid panel   CBC with Differential/Platelet   CMP14+EGFR  Screenings: per lab orders Immunizations: Declines Anticipatory guidance/Risk factor reduction:  Recommendations per AVS.    Meds ordered this encounter  Medications   Semaglutide -Weight Management 0.25 MG/0.5ML SOAJ    Sig: Inject 0.25 mg into the skin once a week for 28 days.    Dispense:  2 mL    Refill:  0   Semaglutide -Weight Management 0.5 MG/0.5ML SOAJ    Sig: Inject 0.5 mg into the skin once a week for 28 days.    Dispense:  2 mL    Refill:  0   Semaglutide -Weight Management 1 MG/0.5ML SOAJ    Sig: Inject 1 mg into the skin once a week for 28 days.     Dispense:  2 mL    Refill:  0    No follow-ups on file.

## 2024-04-03 NOTE — Assessment & Plan Note (Signed)
 Well adult Orders Placed This Encounter  Procedures   HgB A1c   PSA   TSH   Lipid panel   CBC with Differential/Platelet   CMP14+EGFR  Screenings: per lab orders Immunizations: Declines Anticipatory guidance/Risk factor reduction:  Recommendations per AVS.

## 2024-04-04 LAB — CMP14+EGFR
ALT: 21 IU/L (ref 0–44)
AST: 26 IU/L (ref 0–40)
Albumin: 4.4 g/dL (ref 3.9–4.9)
Alkaline Phosphatase: 73 IU/L (ref 44–121)
BUN/Creatinine Ratio: 17 (ref 10–24)
BUN: 15 mg/dL (ref 8–27)
Bilirubin Total: 0.5 mg/dL (ref 0.0–1.2)
CO2: 23 mmol/L (ref 20–29)
Calcium: 9.3 mg/dL (ref 8.6–10.2)
Chloride: 101 mmol/L (ref 96–106)
Creatinine, Ser: 0.86 mg/dL (ref 0.76–1.27)
Globulin, Total: 2.1 g/dL (ref 1.5–4.5)
Glucose: 101 mg/dL — ABNORMAL HIGH (ref 70–99)
Potassium: 4.2 mmol/L (ref 3.5–5.2)
Sodium: 139 mmol/L (ref 134–144)
Total Protein: 6.5 g/dL (ref 6.0–8.5)
eGFR: 95 mL/min/{1.73_m2} (ref >59)

## 2024-04-04 LAB — CBC WITH DIFFERENTIAL/PLATELET
Basophils Absolute: 0.1 10*3/uL (ref 0.0–0.2)
Basos: 1 %
EOS (ABSOLUTE): 0.1 10*3/uL (ref 0.0–0.4)
Eos: 2 %
Hematocrit: 42.3 % (ref 37.5–51.0)
Hemoglobin: 13.8 g/dL (ref 13.0–17.7)
Immature Grans (Abs): 0 10*3/uL (ref 0.0–0.1)
Immature Granulocytes: 0 %
Lymphocytes Absolute: 1.8 10*3/uL (ref 0.7–3.1)
Lymphs: 22 %
MCH: 29.6 pg (ref 26.6–33.0)
MCHC: 32.6 g/dL (ref 31.5–35.7)
MCV: 91 fL (ref 79–97)
Monocytes Absolute: 0.6 10*3/uL (ref 0.1–0.9)
Monocytes: 8 %
Neutrophils Absolute: 5.3 10*3/uL (ref 1.4–7.0)
Neutrophils: 67 %
Platelets: 273 10*3/uL (ref 150–450)
RBC: 4.66 x10E6/uL (ref 4.14–5.80)
RDW: 13.3 % (ref 11.6–15.4)
WBC: 7.9 10*3/uL (ref 3.4–10.8)

## 2024-04-04 LAB — LIPID PANEL
Chol/HDL Ratio: 4.2 ratio (ref 0.0–5.0)
Cholesterol, Total: 122 mg/dL (ref 100–199)
HDL: 29 mg/dL — ABNORMAL LOW (ref >39)
LDL Chol Calc (NIH): 58 mg/dL (ref 0–99)
Triglycerides: 214 mg/dL — ABNORMAL HIGH (ref 0–149)
VLDL Cholesterol Cal: 35 mg/dL (ref 5–40)

## 2024-04-04 LAB — PSA: Prostate Specific Ag, Serum: 0.5 ng/mL (ref 0.0–4.0)

## 2024-04-04 LAB — HEMOGLOBIN A1C
Est. average glucose Bld gHb Est-mCnc: 108 mg/dL
Hgb A1c MFr Bld: 5.4 % (ref 4.8–5.6)

## 2024-04-04 LAB — TSH: TSH: 0.803 u[IU]/mL (ref 0.450–4.500)

## 2024-04-06 ENCOUNTER — Encounter: Payer: HMO | Admitting: Family Medicine

## 2024-04-06 ENCOUNTER — Other Ambulatory Visit (HOSPITAL_COMMUNITY): Payer: Self-pay

## 2024-04-06 NOTE — Telephone Encounter (Signed)
 Pharmacy Patient Advocate Encounter  Received notification from [facility] [employer] that Prior Authorization for  Wegovy  0.25MG /0.5ML auto-injectors  has been APPROVED from 04/03/24 to 11/25/24. Ran test claim, Copay is $517.48. This test claim was processed through [facility]- copay amounts may vary at other pharmacies due to pharmacy/plan contracts, or as the patient moves through the different stages of their insurance plan.   PA #/Case ID/Reference #: [id number]

## 2024-04-07 ENCOUNTER — Telehealth: Payer: Self-pay | Admitting: Family Medicine

## 2024-04-07 NOTE — Telephone Encounter (Signed)
 Copied from CRM [id number]. Topic: Clinical - Prescription Issue >> Apr 07, 2024  4:33 PM Tiffany H wrote: Reason for CRM: Patient called to advise that the copay for Wegovy  is out of his price range. He would like to know if there are any financial assistance programs to help him pay for Wegovy .   Please assist.

## 2024-04-14 ENCOUNTER — Ambulatory Visit: Payer: Self-pay | Admitting: Family Medicine

## 2024-04-14 NOTE — Telephone Encounter (Signed)
 This request has been handled. No further action is required. Per provider's request, the patient has been updated of the following information.

## 2024-04-21 ENCOUNTER — Other Ambulatory Visit: Payer: Self-pay | Admitting: Family Medicine

## 2024-04-21 DIAGNOSIS — I5042 Chronic combined systolic (congestive) and diastolic (congestive) heart failure: Secondary | ICD-10-CM

## 2024-05-13 ENCOUNTER — Ambulatory Visit: Attending: Cardiology

## 2024-05-13 DIAGNOSIS — Z5181 Encounter for therapeutic drug level monitoring: Secondary | ICD-10-CM

## 2024-05-13 DIAGNOSIS — Z954 Presence of other heart-valve replacement: Secondary | ICD-10-CM

## 2024-05-13 DIAGNOSIS — I4891 Unspecified atrial fibrillation: Secondary | ICD-10-CM | POA: Diagnosis not present

## 2024-05-13 LAB — POCT INR: INR: 5.2 — AB (ref 2.0–3.0)

## 2024-05-13 NOTE — Patient Instructions (Signed)
 Description   Skip today and tomorrow's dosage of Warfarin, then start taking 1 tablet daily except 1.5 tablets on Mondays. Recheck in 2 wks

## 2024-05-20 ENCOUNTER — Other Ambulatory Visit: Payer: Self-pay | Admitting: Family Medicine

## 2024-05-20 DIAGNOSIS — I5042 Chronic combined systolic (congestive) and diastolic (congestive) heart failure: Secondary | ICD-10-CM

## 2024-05-27 ENCOUNTER — Ambulatory Visit: Attending: Cardiology | Admitting: *Deleted

## 2024-05-27 DIAGNOSIS — Z954 Presence of other heart-valve replacement: Secondary | ICD-10-CM

## 2024-05-27 DIAGNOSIS — I4891 Unspecified atrial fibrillation: Secondary | ICD-10-CM | POA: Diagnosis not present

## 2024-05-27 DIAGNOSIS — Z5181 Encounter for therapeutic drug level monitoring: Secondary | ICD-10-CM | POA: Diagnosis not present

## 2024-05-27 LAB — POCT INR: INR: 2.6 (ref 2.0–3.0)

## 2024-05-27 NOTE — Progress Notes (Signed)
Please see anticoagulation encounter.

## 2024-05-27 NOTE — Patient Instructions (Signed)
 Continue warfarin 1 tablet daily except 1.5 tablets on Mondays. Recheck in 3 wks

## 2024-06-07 ENCOUNTER — Other Ambulatory Visit: Payer: Self-pay | Admitting: Cardiology

## 2024-06-07 DIAGNOSIS — E782 Mixed hyperlipidemia: Secondary | ICD-10-CM

## 2024-06-15 ENCOUNTER — Other Ambulatory Visit: Payer: Self-pay | Admitting: Family Medicine

## 2024-06-15 DIAGNOSIS — I5042 Chronic combined systolic (congestive) and diastolic (congestive) heart failure: Secondary | ICD-10-CM

## 2024-06-17 ENCOUNTER — Ambulatory Visit: Attending: Cardiology | Admitting: *Deleted

## 2024-06-17 DIAGNOSIS — I4891 Unspecified atrial fibrillation: Secondary | ICD-10-CM

## 2024-06-17 DIAGNOSIS — Z954 Presence of other heart-valve replacement: Secondary | ICD-10-CM

## 2024-06-17 DIAGNOSIS — Z5181 Encounter for therapeutic drug level monitoring: Secondary | ICD-10-CM

## 2024-06-17 LAB — POCT INR: INR: 2.6 (ref 2.0–3.0)

## 2024-06-17 MED ORDER — WARFARIN SODIUM 4 MG PO TABS: ORAL_TABLET | ORAL | 3 refills | Status: DC

## 2024-06-17 NOTE — Progress Notes (Signed)
 Please see anticoagulation encounter 2.6

## 2024-06-17 NOTE — Patient Instructions (Signed)
Continue warfarin 1 tablet daily except 1.5 tablets on Mondays Recheck in 4 wks

## 2024-06-22 ENCOUNTER — Other Ambulatory Visit: Payer: Self-pay | Admitting: Cardiology

## 2024-06-22 DIAGNOSIS — Z954 Presence of other heart-valve replacement: Secondary | ICD-10-CM

## 2024-06-25 ENCOUNTER — Ambulatory Visit: Payer: Self-pay

## 2024-06-25 NOTE — Telephone Encounter (Signed)
 FYI Only or Action Required?: FYI only for provider.  Patient was last seen in primary care on 04/03/2024 by Alvia Bring, DO.  Called Nurse Triage reporting Tick Removal.  Symptoms began yesterday.  Interventions attempted: Nothing.  Symptoms are: gradually worsening.  Triage Disposition: See HCP Within 4 Hours (Or PCP Triage)  Patient/caregiver understands and will follow disposition?: Yes  To UC    Copied from CRM #[id number]. Topic: Clinical - Red Word Triage >> Jun 25, 2024  3:28 PM Susanna ORN wrote: Red Word that prompted transfer to Nurse Triage: Patient states that he had a tick bite the other day on his left leg. Now he has a red rash with a bump in the middle of it. He states that he looked at a picture on his phone about lime disease and he states that it looks just like what he researched. Patient states his wife removed the tick and that's when the red rash appeared. No drainage or anything. Reason for Disposition  [1] Red streak or red line AND [2] length > 2 inches (5 cm)  Answer Assessment - Initial Assessment Questions 1. ATTACHED:  Is the tick still on the skin?  (e.g., yes, no, unsure)     no 2. ONSET - TICK STILL ATTACHED:  How long do you think the tick has been on your skin? (e.g., hours, days, unsure)  Note:  Is there a recent activity (camping, hiking) where the caller may have been exposed?     no 3. ONSET - TICK NOT STILL ATTACHED: If the tick has been removed, how long do you think the tick was attached before you removed it? (e.g., 5 hours, 2 days). When was this?     na 4. LOCATION: Where is the tick bite located? (e.g., arm, leg)     Left leg by groin area 5. TYPE of TICK: Is it a wood tick or a deer tick? (e.g., deer tick, wood tick; unsure)     Seed tick 6. SIZE of TICK: How big is the tick? (e.g., size of poppy seed, apple seed, watermelon seed; unsure) Note: Deer ticks can be the size of a poppy seed (nymph) or an apple seed (adult).        Poppy seed 7. ENGORGED: Did the tick look flat or engorged (full, swollen)? (e.g., flat, engorged; unsure)     yes 8. OTHER SYMPTOMS: Do you have any other symptoms? (e.g., fever, rash, redness at bite area, red ring around bite)     Red, red ring, knot in the middle 9. PREGNANCY: Is there any chance you are pregnant? When was your last menstrual period?     na  Protocols used: Tick Bite-A-AH

## 2024-06-29 ENCOUNTER — Other Ambulatory Visit: Payer: Self-pay | Admitting: Cardiology

## 2024-07-14 ENCOUNTER — Other Ambulatory Visit: Payer: Self-pay | Admitting: Family Medicine

## 2024-07-14 DIAGNOSIS — I5042 Chronic combined systolic (congestive) and diastolic (congestive) heart failure: Secondary | ICD-10-CM

## 2024-07-15 ENCOUNTER — Ambulatory Visit: Attending: Cardiology | Admitting: *Deleted

## 2024-07-15 DIAGNOSIS — Z954 Presence of other heart-valve replacement: Secondary | ICD-10-CM | POA: Diagnosis not present

## 2024-07-15 DIAGNOSIS — I4891 Unspecified atrial fibrillation: Secondary | ICD-10-CM | POA: Diagnosis not present

## 2024-07-15 DIAGNOSIS — Z5181 Encounter for therapeutic drug level monitoring: Secondary | ICD-10-CM | POA: Diagnosis not present

## 2024-07-15 LAB — POCT INR: INR: 4.5 — AB (ref 2.0–3.0)

## 2024-07-15 NOTE — Patient Instructions (Signed)
 Hold warfarin tonight, take 1/2 tablet tomorrow night then resume 1 tablet daily except 1.5 tablets on Mondays. Recheck in 3 wks

## 2024-07-15 NOTE — Progress Notes (Signed)
 INR 4.5 Please see anticoagulation encounter

## 2024-07-29 ENCOUNTER — Encounter: Payer: Self-pay | Admitting: Medical-Surgical

## 2024-07-29 ENCOUNTER — Ambulatory Visit (INDEPENDENT_AMBULATORY_CARE_PROVIDER_SITE_OTHER): Admitting: Medical-Surgical

## 2024-07-29 ENCOUNTER — Ambulatory Visit: Payer: Self-pay

## 2024-07-29 VITALS — BP 114/61 | HR 59 | Resp 20 | Ht 71.0 in | Wt 275.0 lb

## 2024-07-29 DIAGNOSIS — H7292 Unspecified perforation of tympanic membrane, left ear: Secondary | ICD-10-CM

## 2024-07-29 DIAGNOSIS — H6592 Unspecified nonsuppurative otitis media, left ear: Secondary | ICD-10-CM

## 2024-07-29 MED ORDER — CEFDINIR 300 MG PO CAPS
300.0000 mg | ORAL_CAPSULE | Freq: Two times a day (BID) | ORAL | 0 refills | Status: AC
Start: 2024-07-29 — End: ?

## 2024-07-29 NOTE — Telephone Encounter (Signed)
 FYI Only or Action Required?: FYI only for provider.  Patient was last seen in primary care on 04/03/2024 by Alvia Bring, DO.  Called Nurse Triage reporting Ear Drainage.  Symptoms began today.  Interventions attempted: Nothing.  Symptoms are: unchanged.  Triage Disposition: See Physician Within 24 Hours  Patient/caregiver understands and will follow disposition?: yes        Copied from CRM #[id number]. Topic: Clinical - Red Word Triage >> Jul 29, 2024  8:17 AM Carmell SAUNDERS wrote: Red Word that prompted transfer to Nurse Triage: Blood draining out of left ear. No pain, but feels stopped up. When he put a Q-tip in ear its real bloody. Reason for Disposition  Unexplained bleeding from ear  Answer Assessment - Initial Assessment Questions 1. LOCATION: Which ear is involved?      Left  2. COLOR: What is the color of the discharge?      Red blood  3. CONSISTENCY: How runny is the discharge? Could it be water?      Runny /no 4. ONSET: When did you first notice the discharge?     This am  5. PAIN: Is there any earache? How bad is it?  (Scale 0-10; none, mild, moderate or severe)     No on coumadin   6. OBJECTS: Have you put anything in your ear? (e.g., Q-tip, other object)      Qtip  7. OTHER SYMPTOMS: Do you have any other symptoms? (e.g., headache, fever, dizziness, vomiting, runny nose)     dizziness  Protocols used: Ear - Discharge-A-AH

## 2024-07-29 NOTE — Telephone Encounter (Signed)
 Patient seen today

## 2024-07-29 NOTE — Progress Notes (Signed)
        Established patient visit   History of Present Illness   Discussed the use of AI scribe software for clinical note transcription with the patient, who gave verbal consent to proceed.  History of Present Illness   Jeffrey Larson is a 68 year old male who presents with bloody discharge and ringing in the left ear.  Otorrhagia (ear bleeding) - Awoke this morning with a bloody pillow and blood in the left ear - Pure blood observed on Q-tip after cleaning the ear - No associated otalgia  - Mild itching present in the left ear - Currently taking Coumadin , which may contribute to bleeding  Tinnitus - Constant high-pitched ringing in the left ear - No associated muffled hearing  Dizziness - Occasional mild dizziness - Dizziness not significantly bothersome       Physical Exam   Physical Exam Vitals and nursing note reviewed.  Constitutional:      General: He is not in acute distress.    Appearance: Normal appearance.  HENT:     Head: Normocephalic and atraumatic.     Right Ear: Hearing, tympanic membrane, ear canal and external ear normal.     Left Ear: External ear normal.     Ears:     Comments: Full rupture of the Left TM with bloody discharge in the ear canal. Cardiovascular:     Rate and Rhythm: Normal rate and regular rhythm.     Pulses: Normal pulses.     Heart sounds: Normal heart sounds. No murmur heard.    No friction rub. No gallop.  Pulmonary:     Effort: Pulmonary effort is normal. No respiratory distress.     Breath sounds: Normal breath sounds.  Skin:    General: Skin is warm and dry.  Neurological:     Mental Status: He is alert and oriented to person, place, and time.  Psychiatric:        Mood and Affect: Mood normal.        Behavior: Behavior normal.        Thought Content: Thought content normal.        Judgment: Judgment normal.    Assessment & Plan   Assessment and Plan    Left tympanic membrane rupture with tinnitus Acute  perforation with tinnitus, no pain or hearing loss. On Coumadin , increased bleeding risk. Possible spontaneous rupture. - Prescribed Cefdinir  300MG  BID for 7 days. - Urgent ENT referral for evaluation and management. - Advised to avoid water in ear and refrain from Q-tip use. - Instructed to cover ear when exposed to water.     Follow up   Return if symptoms worsen or fail to improve. __________________________________ Zada FREDRIK Palin, DNP, APRN, FNP-BC Primary Care and Sports Medicine [facility] [place]

## 2024-08-10 ENCOUNTER — Ambulatory Visit: Attending: Cardiology | Admitting: *Deleted

## 2024-08-10 ENCOUNTER — Institutional Professional Consult (permissible substitution) (INDEPENDENT_AMBULATORY_CARE_PROVIDER_SITE_OTHER): Admitting: Physician Assistant

## 2024-08-10 ENCOUNTER — Ambulatory Visit (INDEPENDENT_AMBULATORY_CARE_PROVIDER_SITE_OTHER): Admitting: Otolaryngology

## 2024-08-10 VITALS — BP 108/67 | HR 73

## 2024-08-10 DIAGNOSIS — Z954 Presence of other heart-valve replacement: Secondary | ICD-10-CM

## 2024-08-10 DIAGNOSIS — H9313 Tinnitus, bilateral: Secondary | ICD-10-CM | POA: Diagnosis not present

## 2024-08-10 DIAGNOSIS — I4891 Unspecified atrial fibrillation: Secondary | ICD-10-CM | POA: Diagnosis not present

## 2024-08-10 DIAGNOSIS — Z5181 Encounter for therapeutic drug level monitoring: Secondary | ICD-10-CM | POA: Diagnosis not present

## 2024-08-10 DIAGNOSIS — H9222 Otorrhagia, left ear: Secondary | ICD-10-CM

## 2024-08-10 LAB — POCT INR: INR: 2.5 (ref 2.0–3.0)

## 2024-08-10 MED ORDER — OFLOXACIN 0.3 % OT SOLN: 5.0000 [drp] | Freq: Every day | OTIC | 0 refills | Status: AC

## 2024-08-10 NOTE — Progress Notes (Signed)
 ENT CONSULT:  Reason for Consult: left ear bleeding, chronic bilateral tinnitus    HPI: Discussed the use of AI scribe software for clinical note transcription with the patient, who gave verbal consent to proceed.  History of Present Illness Jeffrey Larson is a 68 year old male who presents with left ear bleeding and chronic ringing in his ears.  He woke up with blood on his pillow from his left ear a few days ago. A blood clot was removed when he saw his PCP, and the prior clinician told him that the eardrum looked abnormal. He is currently on blood thinners. No ear pain or drainage is reported, but there is significant ringing in both ears, which has worsened recently. He is unsure of any ear trauma but mentions that his hearing is adequate despite the ringing.  He has been taking oral antibiotics twice a day for seven days. He cannot recall the last time he had a hearing test, indicating it has been a long while. His wife has mentioned that his hearing seems to be deteriorating.  Past Medical History:  Diagnosis Date   Atrial fibrillation (HCC)    hx of   Cancer (HCC)    skin cancer removed from arm dec 2021 melanoma   Chronic combined systolic and diastolic congestive heart failure (HCC)    COPD with chronic bronchitis (HCC)    Coronary artery disease    COVID-19 virus infection 06/2020   all symptoms resolved   Family history of breast cancer    Family history of breast cancer    Family history of lung cancer    History of kidney stones    Hyperlipidemia    Hypertension    Longstanding persistent atrial fibrillation (HCC)    Mitral stenosis with regurgitation    Myocardial infarction (HCC) 2015   stents x 2   Obesity    Personal history of colonic polyps    Pneumonia 2015 and 06-2020   Pulmonary hypertension (HCC)    S/P Maze operation for atrial fibrillation 07/18/2018   Complete bilateral atrial lesion set using bipolar radiofrequency and cryothermy ablation with  clipping of LA appendage   S/P mitral valve replacement with metallic valve 07/18/2018   Sorin Carbomedics Optiform bileaflet mechanical valve, size 33 mm   S/P TVR (tricuspid valve repair) 07/18/2018   Edwards mc3 ring annuloplasty, size 30 mm   Wears partial dentures    upper and lower    Past Surgical History:  Procedure Laterality Date   APPENDECTOMY     as a child   CARDIOVASCULAR STRESS TEST  03/2014   Borderline reversible ischemic changes at the apex.  Normal LV contractility/EF 52%.   CORONARY STENT PLACEMENT  7/12013   LEFT HEART CATHETERIZATION WITH CORONARY ANGIOGRAM N/A 06/12/2012   Procedure: LEFT HEART CATHETERIZATION WITH CORONARY ANGIOGRAM;  Surgeon: Rober LOISE Chroman, MD;  Location: [facility] CATH LAB;  Service: Cardiovascular;  Laterality: N/A;   MAZE N/A 07/18/2018   Procedure: MAZE;  Surgeon: Dusty Sudie VEAR, MD;  Location: [facility] OR;  Service: Open Heart Surgery;  Laterality: N/A;   MITRAL VALVE REPLACEMENT N/A 07/18/2018   Procedure: MITRAL VALVE (MV) REPLACEMENT WITH CARBOMEDICS OPTIFORM MITRAL VALVE SIZE 33.;  Surgeon: Dusty Sudie VEAR, MD;  Location: [facility] OR;  Service: Open Heart Surgery;  Laterality: N/A;   PERCUTANEOUS CORONARY STENT INTERVENTION (PCI-S) N/A 06/17/2012   Procedure: PERCUTANEOUS CORONARY STENT INTERVENTION (PCI-S);  Surgeon: Rober LOISE Chroman, MD;  Location: [facility] CATH LAB;  Service: Cardiovascular;  Laterality: N/A;   RECTAL EXAM UNDER ANESTHESIA N/A 12/15/2020   Procedure: ANORECTAL EXAM UNDER ANESTHESIA EXCISION OF ANAL CANAL POLYP;  Surgeon: Teresa Lonni HERO, MD;  Location: [facility];  Service: General;  Laterality: N/A;   RIGHT/LEFT HEART CATH AND CORONARY ANGIOGRAPHY N/A 06/10/2018   Procedure: RIGHT/LEFT HEART CATH AND CORONARY ANGIOGRAPHY;  Surgeon: Claudene Pacific, MD;  Location: [facility] INVASIVE CV LAB;  Service: Cardiovascular;  Laterality: N/A;   TEE WITHOUT CARDIOVERSION N/A 06/10/2018   Procedure: TRANSESOPHAGEAL ECHOCARDIOGRAM (TEE) with bubble  study;  Surgeon: Claudene Pacific, MD;  Location: [facility] ENDOSCOPY;  Service: Cardiovascular;  Laterality: N/A;   TEE WITHOUT CARDIOVERSION N/A 07/18/2018   Procedure: TRANSESOPHAGEAL ECHOCARDIOGRAM (TEE);  Surgeon: Dusty Sudie DEL, MD;  Location: [facility] OR;  Service: Open Heart Surgery;  Laterality: N/A;   TRICUSPID VALVE REPLACEMENT N/A 07/18/2018   Procedure: TRICUSPID VALVE REPAIR WITH EDWARDS MC3 TRICUSPID ANNULOPLASTY RING SIZE 30.;  Surgeon: Dusty Sudie DEL, MD;  Location: [facility] OR;  Service: Open Heart Surgery;  Laterality: N/A;    Family History  Problem Relation Age of Onset   Heart disease Mother    Heart disease Father    Diabetes Father    Diabetes Brother    Breast cancer Maternal Aunt 35   Cancer Paternal Aunt        unk type, tumor on head   Lung cancer Cousin 59   Cancer Cousin 38   Colon cancer Neg Hx    Esophageal cancer Neg Hx    Stomach cancer Neg Hx     Social History:  reports that he quit smoking about 27 years ago. His smoking use included cigarettes. He started smoking about 42 years ago. He has a 30 pack-year smoking history. He has never used smokeless tobacco. He reports that he does not drink alcohol and does not use drugs.  Allergies:  Allergies  Allergen Reactions   Ramipril  Cough    cough   Percocet [Oxycodone -Acetaminophen ]     In ICU it contributed to hallucinations. Can take by itself    Medications: I have reviewed the patient's current medications.  The PMH, PSH, Medications, Allergies, and SH were reviewed and updated.  ROS: Constitutional: Negative for fever, weight loss and weight gain. Cardiovascular: Negative for chest pain and dyspnea on exertion. Respiratory: Is not experiencing shortness of breath at rest. Gastrointestinal: Negative for nausea and vomiting. Neurological: Negative for headaches. Psychiatric: The patient is not nervous/anxious  Blood pressure 108/67, pulse 73, SpO2 96%.  PHYSICAL EXAM:  Exam: General: Well-developed,  well-nourished Respiratory Respiratory effort: Equal inspiration and expiration without stridor Cardiovascular Peripheral Vascular: Warm extremities with equal color/perfusion Eyes: No nystagmus with equal extraocular motion bilaterally Neuro/Psych/Balance: Patient oriented to person, place, and time; Appropriate mood and affect; Gait is intact with no imbalance; Cranial nerves I-XII are intact Head and Face Inspection: Normocephalic and atraumatic without mass or lesion Palpation: Facial skeleton intact without bony stepoffs Salivary Glands: No mass or tenderness Facial Strength: Facial motility symmetric and full bilaterally ENT Pinna: External ear intact and fully developed External canal: Canal is patent with intact skin, minimal dry blood along floor of the left EAC Tympanic Membrane: Clear and mobile External Nose: No scar or anatomic deformity Lips, Teeth, and gums: Mucosa and teeth intact and viable TMJ: No pain to palpation with full mobility Oral cavity/oropharynx: No erythema or exudate, no lesions present Neck Neck and Trachea: Midline trachea without mass or lesion Thyroid : No mass or nodularity Lymphatics: No lymphadenopathy  Assessment/Plan: Encounter Diagnoses  Name Primary?   Ear bleeding, left Yes   Tinnitus of both ears     Assessment and Plan Assessment & Plan Left ear bleeding, resolved No active bleeding, exam with minimal dry blood in EAC. Eardrum intact. Anticoagulants may contribute to risk of bleeding.  - Prescribe Flonase ear drops for 1-2 weeks then stop  Bilateral tinnitus Increased severity, possibly indicating hearing decline. No recent audiometric evaluation. - Schedule hearing test. - Discuss potential interventions based on results.  Thank you for allowing me to participate in the care of this patient. Please do not hesitate to contact me with any questions or concerns.   Elena Larry, MD Otolaryngology [facility] Phone: [phone] Fax: [phone]    08/10/2024, 11:10 AM

## 2024-08-10 NOTE — Progress Notes (Signed)
 INR 2.5. Please see anticoagulation encounter

## 2024-08-10 NOTE — Patient Instructions (Signed)
Continue warfarin 1 tablet daily except 1.5 tablets on Mondays Recheck in 4 wks

## 2024-08-26 ENCOUNTER — Ambulatory Visit: Attending: Cardiology | Admitting: Cardiology

## 2024-08-26 VITALS — BP 110/62 | HR 71 | Ht 71.0 in | Wt 268.4 lb

## 2024-08-26 DIAGNOSIS — Z954 Presence of other heart-valve replacement: Secondary | ICD-10-CM | POA: Diagnosis not present

## 2024-08-26 DIAGNOSIS — I5020 Unspecified systolic (congestive) heart failure: Secondary | ICD-10-CM | POA: Diagnosis not present

## 2024-08-26 DIAGNOSIS — I4891 Unspecified atrial fibrillation: Secondary | ICD-10-CM

## 2024-08-26 DIAGNOSIS — E782 Mixed hyperlipidemia: Secondary | ICD-10-CM | POA: Diagnosis not present

## 2024-08-26 DIAGNOSIS — I10 Essential (primary) hypertension: Secondary | ICD-10-CM

## 2024-08-26 DIAGNOSIS — D6869 Other thrombophilia: Secondary | ICD-10-CM

## 2024-08-26 NOTE — Patient Instructions (Signed)
 Medication Instructions:  Your physician recommends that you continue on your current medications as directed. Please refer to the Current Medication list given to you today.  *If you need a refill on your cardiac medications before your next appointment, please call your pharmacy*  Lab Work: None If you have labs (blood work) drawn today and your tests are completely normal, you will receive your results only by: MyChart Message (if you have MyChart) OR A paper copy in the mail If you have any lab test that is abnormal or we need to change your treatment, we will call you to review the results.  Testing/Procedures: None  Follow-Up: At [facility], you and your health needs are our priority.  As part of our continuing mission to provide you with exceptional heart care, our providers are all part of one team.  This team includes your primary Cardiologist (physician) and Advanced Practice Providers or APPs (Physician Assistants and Nurse Practitioners) who all work together to provide you with the care you need, when you need it.  Your next appointment:   1 year(s)  Provider:   You may see Alvan Carrier, MD or the following Advanced Practice Provider on your designated Care Team:   Almarie Crate, NP    We recommend signing up for the patient portal called MyChart.  Sign up information is provided on this After Visit Summary.  MyChart is used to connect with patients for Virtual Visits (Telemedicine).  Patients are able to view lab/test results, encounter notes, upcoming appointments, etc.  Non-urgent messages can be sent to your provider as well.   To learn more about what you can do with MyChart, go to [email or web address].   Other Instructions

## 2024-08-26 NOTE — Progress Notes (Signed)
 Clinical Summary Jeffrey Larson is a 68 y.o.male seen today for follow up of the following medical problems.      1. History of MVR for rheumatic mitral stenosis - On 07/18/2018 he underwent mitral valve replacement with mechanical mitral valve, tricuspid valve repair using an Edwards mc3 ring annuloplasty , and Maze procedure -Sorin Carbomedics Optiform bileaflet 33 mm mechanical valve on 07/18/2018.   - Jan 2020 echo LVEF 55-60%, normal MVR   - no SOB/DOE, no LE edema - no bleeding on coumadin .        2. Permanent afib/acquired thrombophilia - denies any palpitations - on coumadin  in setting of afib and mechanical valve, ASA 81mg  daily as well  - no palpitatonis   3. CAD - MI in 2013 with stenting   - He underwent cardiac catheterization on 06/10/2018.  He had nonobstructive disease with a mid RCA 50% stenosis and otherwise 20% stenosis seen in the proximal left circumflex and ostial to proximal LAD.   - no chest pains.    4. HFimpEF - taking torsemide  20mg  daily, denies any LE edema  - had some prior LV dysfunction, 05/2018 TEE LVEF 35-40% - Jan 2020 echo 55-60%   -no SOB/DOE, no LE edema     5. COPD       6. Pulmonary HTN - has been on sildenafil  per CHF clinic   7. HTN - compliant with meds   8. Hyperlipidemia - LDL has been at goal, was 61 in 05/2020  - lbas followed by pcp, he is on atorvastatin  40mg  daily   05/2021 TC 113 TG 153 HDL 29 LDL 60 03/2023 TC 146 HDL 31 TG 313 LDL 77 03/2024 TC 122 TG 214 HDL 35 LDL 58 - upcoming labs with pcp   Past Medical History:  Diagnosis Date   Atrial fibrillation (HCC)    hx of   Cancer (HCC)    skin cancer removed from arm dec 2021 melanoma   Chronic combined systolic and diastolic congestive heart failure (HCC)    COPD with chronic bronchitis (HCC)    Coronary artery disease    COVID-19 virus infection 06/2020   all symptoms resolved   Family history of breast cancer    Family history of breast cancer     Family history of lung cancer    History of kidney stones    Hyperlipidemia    Hypertension    Longstanding persistent atrial fibrillation (HCC)    Mitral stenosis with regurgitation    Myocardial infarction (HCC) 2015   stents x 2   Obesity    Personal history of colonic polyps    Pneumonia 2015 and 06-2020   Pulmonary hypertension (HCC)    S/P Maze operation for atrial fibrillation 07/18/2018   Complete bilateral atrial lesion set using bipolar radiofrequency and cryothermy ablation with clipping of LA appendage   S/P mitral valve replacement with metallic valve 07/18/2018   Sorin Carbomedics Optiform bileaflet mechanical valve, size 33 mm   S/P TVR (tricuspid valve repair) 07/18/2018   Edwards mc3 ring annuloplasty, size 30 mm   Wears partial dentures    upper and lower     Allergies  Allergen Reactions   Ramipril  Cough    cough   Percocet [Oxycodone -Acetaminophen ]     In ICU it contributed to hallucinations. Can take by itself     Current Outpatient Medications  Medication Sig Dispense Refill   acetaminophen  (TYLENOL ) 500 MG tablet Take 1,000 mg by mouth  2 (two) times daily.     alclomethasone (ACLOVATE) 0.05 % cream Apply topically 2 (two) times daily as needed.     aspirin  81 MG tablet Take 81 mg by mouth daily.     atorvastatin  (LIPITOR ) 80 MG tablet Take 1 tablet by mouth in the evening 90 tablet 1   cefdinir  (OMNICEF ) 300 MG capsule Take 1 capsule (300 mg total) by mouth 2 (two) times daily. 14 capsule 0   clindamycin (CLINDAGEL) 1 % gel Apply topically.     metoprolol  succinate (TOPROL -XL) 25 MG 24 hr tablet Take 1 tablet by mouth once daily 90 tablet 1   sacubitril -valsartan  (ENTRESTO ) 24-26 MG Take 1 tablet by mouth 2 (two) times daily. 28 tablet 0   sildenafil  (REVATIO ) 20 MG tablet TAKE 1 TABLET BY MOUTH THREE TIMES DAILY 270 tablet 0   torsemide  (DEMADEX ) 20 MG tablet Take 2 tablets by mouth once daily 180 tablet 1   warfarin (COUMADIN ) 4 MG tablet TAKE 1 TABLET  TO 1 1/2 TABLETS BY MOUTH ONCE DAILY OR  AS  DIRECTED  BY  COUMADIN   CLINIC. 100 tablet 3   No current facility-administered medications for this visit.     Past Surgical History:  Procedure Laterality Date   APPENDECTOMY     as a child   CARDIOVASCULAR STRESS TEST  03/2014   Borderline reversible ischemic changes at the apex.  Normal LV contractility/EF 52%.   CORONARY STENT PLACEMENT  7/12013   LEFT HEART CATHETERIZATION WITH CORONARY ANGIOGRAM N/A 06/12/2012   Procedure: LEFT HEART CATHETERIZATION WITH CORONARY ANGIOGRAM;  Surgeon: Rober LOISE Chroman, MD;  Location: [facility] CATH LAB;  Service: Cardiovascular;  Laterality: N/A;   MAZE N/A 07/18/2018   Procedure: MAZE;  Surgeon: Dusty Sudie DEL, MD;  Location: [facility] OR;  Service: Open Heart Surgery;  Laterality: N/A;   MITRAL VALVE REPLACEMENT N/A 07/18/2018   Procedure: MITRAL VALVE (MV) REPLACEMENT WITH CARBOMEDICS OPTIFORM MITRAL VALVE SIZE 33.;  Surgeon: Dusty Sudie DEL, MD;  Location: [facility] OR;  Service: Open Heart Surgery;  Laterality: N/A;   PERCUTANEOUS CORONARY STENT INTERVENTION (PCI-S) N/A 06/17/2012   Procedure: PERCUTANEOUS CORONARY STENT INTERVENTION (PCI-S);  Surgeon: Rober LOISE Chroman, MD;  Location: [facility] CATH LAB;  Service: Cardiovascular;  Laterality: N/A;   RECTAL EXAM UNDER ANESTHESIA N/A 12/15/2020   Procedure: ANORECTAL EXAM UNDER ANESTHESIA EXCISION OF ANAL CANAL POLYP;  Surgeon: Teresa Lonni HERO, MD;  Location: [facility];  Service: General;  Laterality: N/A;   RIGHT/LEFT HEART CATH AND CORONARY ANGIOGRAPHY N/A 06/10/2018   Procedure: RIGHT/LEFT HEART CATH AND CORONARY ANGIOGRAPHY;  Surgeon: Claudene Pacific, MD;  Location: [facility] INVASIVE CV LAB;  Service: Cardiovascular;  Laterality: N/A;   TEE WITHOUT CARDIOVERSION N/A 06/10/2018   Procedure: TRANSESOPHAGEAL ECHOCARDIOGRAM (TEE) with bubble study;  Surgeon: Claudene Pacific, MD;  Location: [facility] ENDOSCOPY;  Service: Cardiovascular;  Laterality: N/A;   TEE WITHOUT CARDIOVERSION  N/A 07/18/2018   Procedure: TRANSESOPHAGEAL ECHOCARDIOGRAM (TEE);  Surgeon: Dusty Sudie DEL, MD;  Location: [facility] OR;  Service: Open Heart Surgery;  Laterality: N/A;   TRICUSPID VALVE REPLACEMENT N/A 07/18/2018   Procedure: TRICUSPID VALVE REPAIR WITH EDWARDS MC3 TRICUSPID ANNULOPLASTY RING SIZE 30.;  Surgeon: Dusty Sudie DEL, MD;  Location: [facility] OR;  Service: Open Heart Surgery;  Laterality: N/A;     Allergies  Allergen Reactions   Ramipril  Cough    cough   Percocet [Oxycodone -Acetaminophen ]     In ICU it contributed to hallucinations. Can take by itself  Family History  Problem Relation Age of Onset   Heart disease Mother    Heart disease Father    Diabetes Father    Diabetes Brother    Breast cancer Maternal Aunt 13   Cancer Paternal Aunt        unk type, tumor on head   Lung cancer Cousin 63   Cancer Cousin 38   Colon cancer Neg Hx    Esophageal cancer Neg Hx    Stomach cancer Neg Hx      Social History Mr. Steel reports that he quit smoking about 27 years ago. His smoking use included cigarettes. He started smoking about 42 years ago. He has a 30 pack-year smoking history. He has never used smokeless tobacco. Mr. Hyson reports no history of alcohol use.     Physical Examination Today's Vitals   08/26/24 0831  BP: 110/62  Pulse: 71  SpO2: 98%  Weight: 268 lb 6.4 oz (121.7 kg)  Height: 5' 11 (1.803 m)   Body mass index is 37.43 kg/m.  Gen: resting comfortably, no acute distress HEENT: no scleral icterus, pupils equal round and reactive, no palptable cervical adenopathy,  CV: irreg, mechanical S1, no m/rg, no jvd Resp: Clear to auscultation bilaterally GI: abdomen is soft, non-tender, non-distended, normal bowel sounds, no hepatosplenomegaly MSK: extremities are warm, no edema.  Skin: warm, no rash Neuro:  no focal deficits Psych: appropriate affect      Assessment and Plan   1. History of MV replacment/mechnical MV - no symptoms, doing  well 6 years out from surgery - contrinue current meds incluiding coumadin  and ASA in setting of mechanical MV   2. Afib/acquired thrombophilia - on coumadin  in setting of afib and mechanical MV - denies any symptoms - EKG shows rate controlled afib - continue current meds   3. Hyperlipidemia - LDL at goal, discussed dietary changes to lower TGs    4. CAD -denies symptoms, continue current meds   5. Chronic HFimpEF - no symptoms, he is euvolemic today  - continue current meds     Dorn PHEBE Ross, M.D.

## 2024-09-07 ENCOUNTER — Ambulatory Visit: Attending: Cardiology | Admitting: *Deleted

## 2024-09-07 DIAGNOSIS — Z954 Presence of other heart-valve replacement: Secondary | ICD-10-CM | POA: Diagnosis not present

## 2024-09-07 DIAGNOSIS — Z5181 Encounter for therapeutic drug level monitoring: Secondary | ICD-10-CM

## 2024-09-07 DIAGNOSIS — I4891 Unspecified atrial fibrillation: Secondary | ICD-10-CM | POA: Diagnosis not present

## 2024-09-07 LAB — POCT INR: INR: 3.5 — AB (ref 2.0–3.0)

## 2024-09-07 NOTE — Patient Instructions (Signed)
Continue warfarin 1 tablet daily except 1.5 tablets on Mondays Recheck in 4 wks

## 2024-09-07 NOTE — Progress Notes (Signed)
 INR 3.5; Please see anticoagulation encounter

## 2024-09-29 ENCOUNTER — Ambulatory Visit (INDEPENDENT_AMBULATORY_CARE_PROVIDER_SITE_OTHER): Admitting: Audiology

## 2024-10-05 ENCOUNTER — Ambulatory Visit: Attending: Cardiology | Admitting: *Deleted

## 2024-10-05 DIAGNOSIS — I4891 Unspecified atrial fibrillation: Secondary | ICD-10-CM

## 2024-10-05 DIAGNOSIS — Z954 Presence of other heart-valve replacement: Secondary | ICD-10-CM | POA: Diagnosis not present

## 2024-10-05 DIAGNOSIS — Z5181 Encounter for therapeutic drug level monitoring: Secondary | ICD-10-CM | POA: Diagnosis not present

## 2024-10-05 LAB — POCT INR: INR: 2.9 (ref 2.0–3.0)

## 2024-10-05 NOTE — Patient Instructions (Signed)
 Continue warfarin 1 tablet daily except 1.5 tablets on Mondays. Recheck in 6 wks

## 2024-10-05 NOTE — Progress Notes (Signed)
 INR 2.9; Please see anticoagulation encounter

## 2024-10-08 ENCOUNTER — Encounter (INDEPENDENT_AMBULATORY_CARE_PROVIDER_SITE_OTHER): Payer: Self-pay

## 2024-11-11 ENCOUNTER — Telehealth: Payer: Self-pay | Admitting: Pharmacy Technician

## 2024-11-11 ENCOUNTER — Telehealth: Payer: Self-pay | Admitting: Cardiology

## 2024-11-11 ENCOUNTER — Other Ambulatory Visit: Payer: Self-pay | Admitting: Cardiology

## 2024-11-11 ENCOUNTER — Other Ambulatory Visit (HOSPITAL_COMMUNITY): Payer: Self-pay

## 2024-11-11 ENCOUNTER — Ambulatory Visit

## 2024-11-11 VITALS — BP 131/71 | HR 63 | Ht 71.0 in | Wt 274.0 lb

## 2024-11-11 DIAGNOSIS — Z23 Encounter for immunization: Secondary | ICD-10-CM | POA: Diagnosis not present

## 2024-11-11 DIAGNOSIS — Z0000 Encounter for general adult medical examination without abnormal findings: Secondary | ICD-10-CM

## 2024-11-11 MED ORDER — SILDENAFIL CITRATE 20 MG PO TABS: 20.0000 mg | ORAL_TABLET | Freq: Three times a day (TID) | ORAL | 0 refills | Status: AC

## 2024-11-11 MED ORDER — SACUBITRIL-VALSARTAN 24-26 MG PO TABS: 1.0000 | ORAL_TABLET | Freq: Two times a day (BID) | ORAL | 5 refills | Status: AC

## 2024-11-11 NOTE — Telephone Encounter (Signed)
°*  STAT* If patient is at the pharmacy, call can be transferred to refill team.   1. Which medications need to be refilled? (please list name of each medication and dose if known)        sildenafil  (REVATIO ) 20 MG tablet TAKE 1 TABLET BY MOUTH THREE TIMES DAILY      4. Which pharmacy/location (including street and city if local pharmacy) is medication to be sent to?  [id number] - [place], [place] - [address]      5. Do they need a 30 day or 90 day supply? 90

## 2024-11-11 NOTE — Progress Notes (Signed)
 Chief Complaint  Patient presents with   Medicare Wellness     Subjective:   Jeffrey Larson is a 68 y.o. male who presents for a Medicare Annual Wellness Visit.  Visit info / Clinical Intake: Medicare Wellness Visit Type:: Subsequent Annual Wellness Visit Persons participating in visit and providing information:: patient Medicare Wellness Visit Mode:: In-person (required for WTM) Interpreter Needed?: No Pre-visit prep was completed: yes AWV questionnaire completed by patient prior to visit?: yes Date:: 11/10/24 Living arrangements:: lives with spouse/significant other Patient's Overall Health Status Rating: very good Typical amount of pain: none Does pain affect daily life?: no Are you currently prescribed opioids?: no  Dietary Habits and Nutritional Risks How many meals a day?: 2 Eats fruit and vegetables daily?: yes Most meals are obtained by: eating out In the last 2 weeks, have you had any of the following?: none Diabetic:: no  Functional Status Activities of Daily Living (to include ambulation/medication): Independent Ambulation: Independent Medication Administration: Independent Home Management (perform basic housework or laundry): Independent Manage your own finances?: yes Primary transportation is: driving Concerns about vision?: no *vision screening is required for WTM* Concerns about hearing?: no  Fall Screening Falls in the past year?: 0 Number of falls in past year: 0 Was there an injury with Fall?: 0 Fall Risk Category Calculator: 0 Patient Fall Risk Level: Low Fall Risk  Fall Risk Patient at Risk for Falls Due to: No Fall Risks Fall risk Follow up: Falls evaluation completed  Home and Transportation Safety: All rugs have non-skid backing?: N/A, no rugs All stairs or steps have railings?: yes Grab bars in the bathtub or shower?: yes Have non-skid surface in bathtub or shower?: yes Good home lighting?: yes Regular seat belt use?: yes Hospital  stays in the last year:: no  Cognitive Assessment Difficulty concentrating, remembering, or making decisions? : no Will 6CIT or Mini Cog be Completed: yes What year is it?: 0 points What month is it?: 0 points Give patient an address phrase to remember (5 components): [address] [place], [place] [address] About what time is it?: 0 points Count backwards from 20 to 1: 0 points Say the months of the year in reverse: 0 points Repeat the address phrase from earlier: 2 points 6 CIT Score: 2 points  Advance Directives (For Healthcare) Does Patient Have a Medical Advance Directive?: Yes Does patient want to make changes to medical advance directive?: No - Patient declined Type of Advance Directive: Healthcare Power of Attorney  Reviewed/Updated  Reviewed/Updated: Medical History; Surgical History; Medications; Allergies; Care Teams; Patient Goals    Allergies (verified) Ramipril  and Percocet [oxycodone -acetaminophen ]   Current Medications (verified) Outpatient Encounter Medications as of 11/11/2024  Medication Sig   acetaminophen  (TYLENOL ) 500 MG tablet Take 1,000 mg by mouth 2 (two) times daily.   alclomethasone (ACLOVATE) 0.05 % cream Apply topically 2 (two) times daily as needed.   aspirin  81 MG tablet Take 81 mg by mouth daily.   atorvastatin  (LIPITOR ) 80 MG tablet Take 1 tablet by mouth in the evening   clindamycin (CLINDAGEL) 1 % gel Apply topically.   metoprolol  succinate (TOPROL -XL) 25 MG 24 hr tablet Take 1 tablet by mouth once daily   sacubitril -valsartan  (ENTRESTO ) 24-26 MG Take 1 tablet by mouth 2 (two) times daily.   sildenafil  (REVATIO ) 20 MG tablet Take 1 tablet (20 mg total) by mouth 3 (three) times daily.   torsemide  (DEMADEX ) 20 MG tablet Take 2 tablets by mouth once daily   warfarin (COUMADIN )  4 MG tablet TAKE 1 TABLET TO 1 1/2 TABLETS BY MOUTH ONCE DAILY OR  AS  DIRECTED  BY  COUMADIN   CLINIC.   [DISCONTINUED] cefdinir  (OMNICEF ) 300 MG capsule Take 1 capsule (300 mg  total) by mouth 2 (two) times daily.   No facility-administered encounter medications on file as of 11/11/2024.    History: Past Medical History:  Diagnosis Date   Atrial fibrillation (HCC)    hx of   Cancer (HCC)    skin cancer removed from arm dec 2021 melanoma   Chronic combined systolic and diastolic congestive heart failure (HCC)    COPD with chronic bronchitis (HCC)    Coronary artery disease    COVID-19 virus infection 06/2020   all symptoms resolved   Family history of breast cancer    Family history of breast cancer    Family history of lung cancer    History of kidney stones    Hyperlipidemia    Hypertension    Longstanding persistent atrial fibrillation (HCC)    Mitral stenosis with regurgitation    Myocardial infarction (HCC) 2015   stents x 2   Obesity    Personal history of colonic polyps    Pneumonia 2015 and 06-2020   Pulmonary hypertension (HCC)    S/P Maze operation for atrial fibrillation 07/18/2018   Complete bilateral atrial lesion set using bipolar radiofrequency and cryothermy ablation with clipping of LA appendage   S/P mitral valve replacement with metallic valve 07/18/2018   Sorin Carbomedics Optiform bileaflet mechanical valve, size 33 mm   S/P TVR (tricuspid valve repair) 07/18/2018   Edwards mc3 ring annuloplasty, size 30 mm   Wears partial dentures    upper and lower   Past Surgical History:  Procedure Laterality Date   APPENDECTOMY     as a child   CARDIOVASCULAR STRESS TEST  03/2014   Borderline reversible ischemic changes at the apex.  Normal LV contractility/EF 52%.   CORONARY STENT PLACEMENT  7/12013   LEFT HEART CATHETERIZATION WITH CORONARY ANGIOGRAM N/A 06/12/2012   Procedure: LEFT HEART CATHETERIZATION WITH CORONARY ANGIOGRAM;  Surgeon: Rober LOISE Chroman, MD;  Location: [facility] CATH LAB;  Service: Cardiovascular;  Laterality: N/A;   MAZE N/A 07/18/2018   Procedure: MAZE;  Surgeon: Dusty Sudie DEL, MD;  Location: [facility] OR;  Service: Open Heart  Surgery;  Laterality: N/A;   MITRAL VALVE REPLACEMENT N/A 07/18/2018   Procedure: MITRAL VALVE (MV) REPLACEMENT WITH CARBOMEDICS OPTIFORM MITRAL VALVE SIZE 33.;  Surgeon: Dusty Sudie DEL, MD;  Location: [facility] OR;  Service: Open Heart Surgery;  Laterality: N/A;   PERCUTANEOUS CORONARY STENT INTERVENTION (PCI-S) N/A 06/17/2012   Procedure: PERCUTANEOUS CORONARY STENT INTERVENTION (PCI-S);  Surgeon: Rober LOISE Chroman, MD;  Location: [facility] CATH LAB;  Service: Cardiovascular;  Laterality: N/A;   RECTAL EXAM UNDER ANESTHESIA N/A 12/15/2020   Procedure: ANORECTAL EXAM UNDER ANESTHESIA EXCISION OF ANAL CANAL POLYP;  Surgeon: Teresa Lonni HERO, MD;  Location: [facility];  Service: General;  Laterality: N/A;   RIGHT/LEFT HEART CATH AND CORONARY ANGIOGRAPHY N/A 06/10/2018   Procedure: RIGHT/LEFT HEART CATH AND CORONARY ANGIOGRAPHY;  Surgeon: Claudene Pacific, MD;  Location: [facility] INVASIVE CV LAB;  Service: Cardiovascular;  Laterality: N/A;   TEE WITHOUT CARDIOVERSION N/A 06/10/2018   Procedure: TRANSESOPHAGEAL ECHOCARDIOGRAM (TEE) with bubble study;  Surgeon: Claudene Pacific, MD;  Location: [facility] ENDOSCOPY;  Service: Cardiovascular;  Laterality: N/A;   TEE WITHOUT CARDIOVERSION N/A 07/18/2018   Procedure: TRANSESOPHAGEAL ECHOCARDIOGRAM (TEE);  Surgeon: Dusty Sudie  H, MD;  Location: [facility] OR;  Service: Open Heart Surgery;  Laterality: N/A;   TRICUSPID VALVE REPLACEMENT N/A 07/18/2018   Procedure: TRICUSPID VALVE REPAIR WITH EDWARDS MC3 TRICUSPID ANNULOPLASTY RING SIZE 30.;  Surgeon: Dusty Sudie DEL, MD;  Location: [facility] OR;  Service: Open Heart Surgery;  Laterality: N/A;   Family History  Problem Relation Age of Onset   Heart disease Mother    Heart disease Father    Diabetes Father    Diabetes Brother    Breast cancer Maternal Aunt 80   Cancer Paternal Aunt        unk type, tumor on head   Lung cancer Cousin 19   Cancer Cousin 38   Colon cancer Neg Hx    Esophageal cancer Neg Hx    Stomach cancer Neg Hx     Social History   Occupational History   Occupation: Retired.  Tobacco Use   Smoking status: Former    Current packs/day: 0.00    Average packs/day: 2.0 packs/day for 15.0 years (30.0 ttl pk-yrs)    Types: Cigarettes    Start date: 11/26/1981    Quit date: 11/26/1996    Years since quitting: 27.9   Smokeless tobacco: Never  Vaping Use   Vaping status: Never Used  Substance and Sexual Activity   Alcohol use: No   Drug use: No   Sexual activity: Yes   Tobacco Counseling Counseling given: Not Answered  SDOH Screenings   Food Insecurity: No Food Insecurity (11/11/2024)  Housing: Low Risk (11/11/2024)  Transportation Needs: No Transportation Needs (11/11/2024)  Utilities: Not At Risk (11/11/2024)  Alcohol Screen: Low Risk (10/03/2023)  Depression (PHQ2-9): Low Risk (11/11/2024)  Financial Resource Strain: Low Risk (11/10/2024)  Physical Activity: Sufficiently Active (11/11/2024)  Social Connections: Socially Integrated (11/11/2024)  Stress: No Stress Concern Present (11/11/2024)  Tobacco Use: Medium Risk (11/11/2024)  Health Literacy: Adequate Health Literacy (11/11/2024)   See flowsheets for full screening details  Depression Screen PHQ 2 & 9 Depression Scale- Over the past 2 weeks, how often have you been bothered by any of the following problems? Little interest or pleasure in doing things: 0 Feeling down, depressed, or hopeless (PHQ Adolescent also includes...irritable): 0 PHQ-2 Total Score: 0     Goals Addressed             This Visit's Progress    Patient Stated       Patient states he would like to lose weight.              Objective:    Today's Vitals   11/11/24 1035  BP: 131/71  Pulse: 63  SpO2: 100%  Weight: 274 lb (124.3 kg)  Height: 5' 11 (1.803 m)   Body mass index is 38.22 kg/m.  Hearing/Vision screen No results found. Immunizations and Health Maintenance Health Maintenance  Topic Date Due   Zoster Vaccines- Shingrix (2 of 2)  12/23/2023   COVID-19 Vaccine (4 - 2025-26 season) 07/27/2024   Medicare Annual Wellness (AWV)  11/11/2025   DTaP/Tdap/Td (3 - Td or Tdap) 04/21/2030   Pneumococcal Vaccine: 50+ Years  Completed   Influenza Vaccine  Completed   Hepatitis C Screening  Completed   Meningococcal B Vaccine  Aged Out   Fecal DNA (Cologuard)  Discontinued        Assessment/Plan:  This is a routine wellness examination for [employer].  Patient Care Team: Alvia Bring, DO as PCP - General (Family Medicine) Alvan Dorn FALCON, MD as PCP -  Cardiology (Cardiology) Levern Hutching, MD as Consulting Physician (Cardiology)  I have personally reviewed and noted the following in the patients chart:   Medical and social history Use of alcohol, tobacco or illicit drugs  Current medications and supplements including opioid prescriptions. Functional ability and status Nutritional status Physical activity Advanced directives List of other physicians Hospitalizations, surgeries, and ER visits in previous 12 months Vitals Screenings to include cognitive, depression, and falls Referrals and appointments  Orders Placed This Encounter  Procedures   Flu vaccine HIGH DOSE PF(Fluzone Trivalent)   In addition, I have reviewed and discussed with patient certain preventive protocols, quality metrics, and best practice recommendations. A written personalized care plan for preventive services as well as general preventive health recommendations were provided to patient.   Bonny Jon Mayor, CMA   11/11/2024   Return in 1 year (on 11/11/2025).  After Visit Summary: (In Person-Declined) Patient declined AVS at this time.  Nurse Notes:   Jeffrey Larson is a 68 y.o. male patient of Alvia Bring, DO who had a Medicare Annual Wellness Visit today. Davaun is Retired and lives with their spouse. He has 2 children. He reports that he is socially active and does interact with friends/family regularly. He is moderately  physically active and enjoys riding horses and going fishing on his boat.

## 2024-11-11 NOTE — Telephone Encounter (Signed)
 Patient calling the office for samples of medication:   1.  What medication and dosage are you requesting samples for?   sacubitril -valsartan  (ENTRESTO ) 24-26 MG    2.  Are you currently out of this medication?  No but pt states he cannot afford a refill right now

## 2024-11-11 NOTE — Telephone Encounter (Signed)
 Per Dr. Alvan, ok to fill.

## 2024-11-11 NOTE — Patient Instructions (Signed)
 Mr. Ditmars,  Thank you for taking the time for your Medicare Wellness Visit. I appreciate your continued commitment to your health goals. Please review the care plan we discussed, and feel free to reach out if I can assist you further.  Please note that Annual Wellness Visits do not include a physical exam. Some assessments may be limited, especially if the visit was conducted virtually. If needed, we may recommend an in-person follow-up with your provider.  Ongoing Care Seeing your primary care provider every 3 to 6 months helps us  monitor your health and provide consistent, personalized care.   Referrals If a referral was made during today's visit and you haven't received any updates within two weeks, please contact the referred provider directly to check on the status.  Recommended Screenings:  Health Maintenance  Topic Date Due   Zoster (Shingles) Vaccine (2 of 2) 12/23/2023   Medicare Annual Wellness Visit  04/01/2024   COVID-19 Vaccine (4 - 2025-26 season) 07/27/2024   DTaP/Tdap/Td vaccine (3 - Td or Tdap) 04/21/2030   Pneumococcal Vaccine for age over 73  Completed   Flu Shot  Completed   Hepatitis C Screening  Completed   Meningitis B Vaccine  Aged Out   Cologuard (Stool DNA test)  Discontinued       11/11/2024   10:48 AM  Advanced Directives  Does Patient Have a Medical Advance Directive? Yes  Type of Advance Directive Healthcare Power of Attorney  Does patient want to make changes to medical advance directive? No - Patient declined    Vision: Annual vision screenings are recommended for early detection of glaucoma, cataracts, and diabetic retinopathy. These exams can also reveal signs of chronic conditions such as diabetes and high blood pressure.  Dental: Annual dental screenings help detect early signs of oral cancer, gum disease, and other conditions linked to overall health, including heart disease and diabetes. Please see the attached documents for additional  preventive care recommendations.

## 2024-11-11 NOTE — Telephone Encounter (Signed)
° °  Patient Advocate Encounter   The patient was approved for a [employer] grant that will help cover the cost of ENTRESTO  Total amount awarded, 7500.  Effective: 10/12/24 - 10/11/25   APW:[id number] ERW:[id number] Group:[id number] PI:[id number] [employer] ID: [id number]   Pharmacy provided with approval and processing information. Patient informed via telephone   I called [facility] and gave them the grant information and they said they need a new prescription. Sent note in other encounter. I did call the patient to make him aware it will be free once rx was sent in

## 2024-11-11 NOTE — Telephone Encounter (Signed)
 Per Med Assist: I got the patient a grant to cover the cost. I called [facility] and gave them the grant information but they said they need a new prescription. Can someone please send in the entresto  rx to his [facility]? Thank you! I did call the patient to make him aware it will be free once rx was sent in.  Refill sent in to [facility]

## 2024-11-16 ENCOUNTER — Ambulatory Visit: Attending: Cardiology | Admitting: *Deleted

## 2024-11-16 ENCOUNTER — Telehealth: Payer: Self-pay | Admitting: Cardiology

## 2024-11-16 DIAGNOSIS — Z954 Presence of other heart-valve replacement: Secondary | ICD-10-CM | POA: Diagnosis not present

## 2024-11-16 DIAGNOSIS — I4891 Unspecified atrial fibrillation: Secondary | ICD-10-CM | POA: Diagnosis not present

## 2024-11-16 DIAGNOSIS — Z5181 Encounter for therapeutic drug level monitoring: Secondary | ICD-10-CM

## 2024-11-16 LAB — POCT INR: INR: 2.8 (ref 2.0–3.0)

## 2024-11-16 MED ORDER — METOPROLOL SUCCINATE ER 25 MG PO TB24: 25.0000 mg | ORAL_TABLET | Freq: Every day | ORAL | 3 refills | Status: AC

## 2024-11-16 NOTE — Telephone Encounter (Signed)
 1. Which medications need to be refilled? (please list name of each medication and dose if known)  Metoprolol  er 25mg  2. Which pharmacy/location (including street and city if local pharmacy) is medication to be sent to? [facility] [place] 3. Do they need a 30 day or 90 day supply?  90

## 2024-11-16 NOTE — Patient Instructions (Signed)
 Continue warfarin 1 tablet daily except 1.5 tablets on Mondays. Recheck in 6 wks

## 2024-11-16 NOTE — Progress Notes (Signed)
 INR-2.8 Please see anticoagulation encounter

## 2024-11-16 NOTE — Telephone Encounter (Signed)
 Refill sent

## 2024-12-01 ENCOUNTER — Other Ambulatory Visit: Payer: Self-pay | Admitting: Cardiology

## 2024-12-01 DIAGNOSIS — E782 Mixed hyperlipidemia: Secondary | ICD-10-CM

## 2024-12-08 ENCOUNTER — Telehealth: Payer: Self-pay

## 2024-12-08 NOTE — Telephone Encounter (Signed)
 Please advise. Last tetanus was given 04/21/2020. Should the patient have a [place] for a Tdap booster since it has been 5 years since his last immunization? Thanks in advance.

## 2024-12-08 NOTE — Telephone Encounter (Signed)
 Copied from CRM [id number]. Topic: Clinical - Medical Advice >> Dec 08, 2024  9:15 AM Rosaria A wrote: Reason for CRM: Patient states that he stepped on a nail yesterday and want to make sure his tetanus shot is up to date. Pulled immunization record and last tdap shot was 03/2020. Patient is wanting to make sure he is good with his tetanus shot since he stepped on the nail. Please call patient back to discuss.

## 2024-12-08 NOTE — Telephone Encounter (Signed)
 The patient has been updated of the provider's recommendation. No further inquiries during the call. Task completed.

## 2024-12-23 ENCOUNTER — Other Ambulatory Visit: Payer: Self-pay

## 2024-12-23 DIAGNOSIS — Z954 Presence of other heart-valve replacement: Secondary | ICD-10-CM

## 2024-12-23 MED ORDER — WARFARIN SODIUM 4 MG PO TABS: ORAL_TABLET | ORAL | 3 refills | Status: AC

## 2024-12-28 ENCOUNTER — Ambulatory Visit

## 2024-12-29 ENCOUNTER — Ambulatory Visit

## 2024-12-29 DIAGNOSIS — Z5181 Encounter for therapeutic drug level monitoring: Secondary | ICD-10-CM | POA: Diagnosis not present

## 2024-12-29 DIAGNOSIS — I4891 Unspecified atrial fibrillation: Secondary | ICD-10-CM | POA: Diagnosis not present

## 2024-12-29 DIAGNOSIS — Z954 Presence of other heart-valve replacement: Secondary | ICD-10-CM | POA: Diagnosis not present

## 2024-12-29 LAB — POCT INR: INR: 2.6 (ref 2.0–3.0)

## 2024-12-29 NOTE — Progress Notes (Signed)
 INR 2.6

## 2024-12-29 NOTE — Patient Instructions (Signed)
 Continue warfarin 1 tablet daily except 1.5 tablets on Mondays. Recheck in 6 wks

## 2025-02-09 ENCOUNTER — Ambulatory Visit

## 2025-11-16 ENCOUNTER — Ambulatory Visit
